# Patient Record
Sex: Male | Born: 1940
Health system: Southern US, Community
[De-identification: ages and names within clinical notes are randomized; demographics above are authoritative.]

## PROBLEM LIST (undated history)

## (undated) DIAGNOSIS — R011 Cardiac murmur, unspecified: Secondary | ICD-10-CM

## (undated) DIAGNOSIS — I10 Essential (primary) hypertension: Secondary | ICD-10-CM

## (undated) DIAGNOSIS — R630 Anorexia: Secondary | ICD-10-CM

## (undated) DIAGNOSIS — M1990 Unspecified osteoarthritis, unspecified site: Secondary | ICD-10-CM

## (undated) DIAGNOSIS — I4892 Unspecified atrial flutter: Secondary | ICD-10-CM

## (undated) DIAGNOSIS — E7800 Pure hypercholesterolemia, unspecified: Secondary | ICD-10-CM

## (undated) DIAGNOSIS — M549 Dorsalgia, unspecified: Secondary | ICD-10-CM

## (undated) DIAGNOSIS — I350 Nonrheumatic aortic (valve) stenosis: Secondary | ICD-10-CM

## (undated) DIAGNOSIS — R29898 Other symptoms and signs involving the musculoskeletal system: Secondary | ICD-10-CM

## (undated) DIAGNOSIS — M109 Gout, unspecified: Secondary | ICD-10-CM

## (undated) DIAGNOSIS — C61 Malignant neoplasm of prostate: Secondary | ICD-10-CM

## (undated) DIAGNOSIS — Z87442 Personal history of urinary calculi: Secondary | ICD-10-CM

## (undated) DIAGNOSIS — E119 Type 2 diabetes mellitus without complications: Secondary | ICD-10-CM

## (undated) DIAGNOSIS — K219 Gastro-esophageal reflux disease without esophagitis: Secondary | ICD-10-CM

## (undated) HISTORY — PX: CATARACT EXTRACTION W/ INTRAOCULAR LENS IMPLANT: SHX1309

## (undated) HISTORY — PX: EYE SURGERY: SHX253

## (undated) HISTORY — DX: Essential (primary) hypertension: I10

## (undated) HISTORY — DX: Malignant neoplasm of prostate: C61

## (undated) HISTORY — DX: Unspecified osteoarthritis, unspecified site: M19.90

## (undated) HISTORY — DX: Unspecified atrial flutter: I48.92

## (undated) HISTORY — PX: COLON RESECTION: SHX5231

## (undated) HISTORY — PX: HERNIA REPAIR: SHX51

## (undated) HISTORY — DX: Nonrheumatic aortic (valve) stenosis: I35.0

---

## 1992-08-20 HISTORY — PX: PROSTATECTOMY: SHX69

## 2002-01-21 ENCOUNTER — Ambulatory Visit (HOSPITAL_COMMUNITY): Admission: RE | Admit: 2002-01-21 | Discharge: 2002-01-21 | Payer: Self-pay | Admitting: Gastroenterology

## 2002-01-21 ENCOUNTER — Encounter (INDEPENDENT_AMBULATORY_CARE_PROVIDER_SITE_OTHER): Payer: Self-pay | Admitting: Specialist

## 2007-11-24 ENCOUNTER — Encounter (INDEPENDENT_AMBULATORY_CARE_PROVIDER_SITE_OTHER): Payer: Self-pay | Admitting: General Surgery

## 2007-11-24 ENCOUNTER — Inpatient Hospital Stay (HOSPITAL_COMMUNITY): Admission: RE | Admit: 2007-11-24 | Discharge: 2007-11-30 | Payer: Self-pay | Admitting: General Surgery

## 2008-10-06 ENCOUNTER — Encounter: Admission: RE | Admit: 2008-10-06 | Discharge: 2008-11-11 | Payer: Self-pay | Admitting: Family Medicine

## 2010-06-05 ENCOUNTER — Encounter: Admission: RE | Admit: 2010-06-05 | Discharge: 2010-06-05 | Payer: Self-pay | Admitting: Family Medicine

## 2011-01-02 NOTE — Op Note (Signed)
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   POSTOP DIAGNOSIS:  Highly dysplastic tubulovillous adenoma of the mid  transverse colon.   PROCEDURE:  Partial colectomy (transverse colectomy) with mobilization  of splenic flexure.   SURGEON:  Adolph Pollack, M.D.   ASSISTANT:  Cyndia Bent, MD   ANESTHESIA:  General.   INDICATIONS:  This 70 year old male has a history of colonic polyps and  was having a follow-up colonoscopy when a 15 mm polyp was found in the  descending colon, biopsied with the above pathology results and a high  suspicion for potential malignancy.  He now presents for elective  partial colectomy.   TECHNIQUE:  He was seen in the holding area and brought to the operating  room, placed supine on the operating table and general anesthetic was  administered.  A Foley catheter was placed in the bladder.  Hair of the  abdominal wall was clipped and the area sterilely prepped and draped.  He had a previous epigastric incision and a lower midline incision.  I  began inferior to the xiphoid process and made a midline incision  through the previous epigastric scar and below it.  I divided the skin,  subcutaneous tissue and fascia and I also cut some sutures from his  previous primary hernia repair.  Entering the peritoneal cavity there  were omental adhesions to the abdominal wall which were able to be taken  down using electrocautery.  A Bookwalter retractor was used for  exposure.   Following this I began by exploring the abdomen.  The liver was smooth  without lesions.  I  started at the distal transverse colon and I began  mobilizing the colon, specifically mobilizing the splenic flexure by  dividing its attachments using electrocautery and using careful blunt  dissection.  I could not find an obvious tattoo mark in the distal  transverse colon nor in the proximal descending colon.  Subsequently I  mobilized the descending colon all the way down to mobilizing the  sigmoid colon and again did not find a tattoo mark.  I then began  dissecting the omentum off the transverse colon and in the mid  transverse colon I then found the tattoo mark.  I dissected the omentum  all the way to the hepatic flexure and mobilized the hepatic flexure  proximally.  The splenic flexure had been completely mobilized.  I was  able to medialized the left colon into the midline area.   I picked an area of resection in the transverse colon.  I then divided  the mesentery in a wedge-shaped fashion between the proximal and distal  areas of transection.  I then placed the  portions of the colon side-to-  side.  An enterotomy was made in the proximal and distal portion of the  colon chosen for resection.  I then performed a side-to-side stapled  anastomosis between the distal ascending colon and the proximal  descending colon.  Using the TA-90 stapler I then sealed the common  defect by stapling it and then sent this specimen for pathology which  included a tattoo mark marking the proximal end with a suture.   Upon inspection of the anastomosis it was patent, it was viable and it  was under no tension.  A crotch stitch of 3-0 silk was used at the  distal staple line.   At this point, gloves were changed.  The abdominal cavity was then  irrigated with warm saline solution.  Some bleeding points of the  mesentery on the left side were controlled with LigaSure and  electrocautery.  The right side was hemostatic and the staple line was  hemostatic.  No intestinal injury or solid organ  injury was noted and  there was no evidence of active bleeding.   Following this I requested a sponge count which was reported be correct.  The midline fascia was then closed with a running #1 Novofil suture.  At  this point, needle, instrument, and sponge counts were reported to be  correct with this being the second sponge count.  The subcutaneous  tissue was irrigated.  The skin was closed with staples and a sterile  dressing was applied.   He tolerated the procedure without apparent complications and was taken  to recovery in satisfactory condition.      Adolph Pollack, M.D.  Electronically Signed     TJR/MEDQ  D:  11/24/2007  T:  11/24/2007  Job:  [id number]   cc:   Danise Edge, M.D.  Fax: [phone]   Chales Salmon. Abigail Miyamoto, M.D.  Fax: [phone]

## 2011-01-02 NOTE — H&P (Signed)
[name], [name]                   ACCOUNT NO.:  [id number]   MEDICAL RECORD NO.:  [id number]          PATIENT TYPE:  INP   LOCATION:  0005                         FACILITY:  [facility]   PHYSICIAN:  Adolph Pollack, M.D.DATE OF BIRTH:  August 28, 1940   DATE OF ADMISSION:  11/24/2007  DATE OF DISCHARGE:                              HISTORY & PHYSICAL   REASON FOR ADMISSION:  Elective partial colectomy.   HISTORY OF PRESENT ILLNESS:  Mr. [name] is a 70 year old male who  underwent a colonoscopy by Dr. Danise Edge as he has had a personal  history of colonic polyps.  There is polyp found in the proximal  descending colon that was biopsied and demonstrated a tubulovillous  adenoma with high-grade glandular dysplasia and focal atypia suspicious  for invasion into the stromal of the polyp which was suspicious for an  actual malignancy.  The polyp was removed in segmental fashion by Dr.  Laural Benes and then a tattoo placed and now he presents for elective  partial colectomy.   PAST MEDICAL HISTORY:  1. Prostate cancer.  2. Gastroesophageal reflux.  3. Hyperlipidemia.  4. Type 2 diabetes.  5. Hypertension.  6. Gout.  7. Colonic polyps.   PREVIOUS OPERATIONS:  1. Umbilical/epigastric hernia.  2. Prostatectomy.   ALLERGIES:  NO KNOWN DRUG ALLERGIES.   CURRENT MEDICATIONS:  Ranitidine, Tricor, Lipitor,  lisinopril/hydrochlorothiazide, verapamil ER, Zetia, Actos, allopurinol,  baby aspirin.   SOCIAL HISTORY:  He is married.  He works for [employer].  No tobacco or  alcohol use.   FAMILY HISTORY:  Family history is notable for breast cancer but no  colon cancer.   PHYSICAL EXAM:  GENERAL:  A moderately overweight male in no acute  distress, pleasant and cooperative.  VITAL SIGNS:  Temperature 97.6, pulse 92, blood pressure is 148/85.  EYES:  Extraocular motions intact.  No icterus.  NECK:  Neck is supple without masses.  RESPIRATORY:  Breath sounds equal and clear.  Respirations  unlabored.  CARDIOVASCULAR:  Demonstrates a regular rate, irregular rhythm.  ABDOMEN:  His abdomen is soft with epigastric midline scar and a lower  midline scar.  No palpable masses.  No tenderness.  No organomegaly.  MUSCULOSKELETAL:  He has SCDs on his lower extremities.  SKIN:  No jaundice.   IMPRESSION:  Highly dysplastic tubulovillous adenoma of proximal  descending colon, suspicious for actual carcinoma but not definitive by  biopsy.   PLAN:  Partial colectomy.  He and I have discussed the procedure and  risks preoperatively.      Adolph Pollack, M.D.  Electronically Signed     TJR/MEDQ  D:  11/24/2007  T:  11/24/2007  Job:  [id number]

## 2011-01-05 NOTE — Procedures (Signed)
[facility]  Patient:    [name], [name] Visit Number: [id number] MRN: [id number]          Service Type: END Location: ENDO Attending Physician:  Dennison Bulla Ii Dictated by:   Verlin Grills, M.D. Proc. Date: 01/21/02 Admit Date:  01/21/2002 Discharge Date: 01/21/2002   CC:         Molly Maduro K. Abigail Miyamoto, M.D.   Procedure Report  PROCEDURE:  Colonoscopy with polypectomy.  REFERRING PHYSICIAN:  Chales Salmon. Abigail Miyamoto, M.D.  PROCEDURE INDICATION:  [name] is a 70 year old male, born 1941-04-09.  [name] underwent his health maintenance flexible proctosigmoidoscopy, performed by Dr. Henrine Screws, and a small distal rectal polyp was discovered.  ENDOSCOPIST:  Verlin Grills, M.D.  PREMEDICATION:  Versed 5 mg, Demerol 50 mg.  ENDOSCOPE:  Olympus pediatric colonoscope.  DESCRIPTION OF PROCEDURE:  After obtaining informed consent, [name] was placed in the left lateral decubitus position.  I administered intravenous Demerol and intravenous Versed to achieve conscious sedation for the procedure.  The patients blood pressure, oxygen saturation, and cardiac rhythm were monitored throughout the procedure and documented in the medical record.  Anal inspection was normal.  Digital rectal exam was normal.  The patient underwent prostate cancer surgery in 1994.  The Olympus pediatric video colonoscope was introduced into the rectum and easily advanced to the cecum. Colonic preparation for the exam today was excellent.  RECTUM:  From the distal rectum, a 0.5 mm sessile polyp was removed with the cold biopsy forceps.  SIGMOID COLON AND DESCENDING COLON:  At 40 cm from the anal verge, a 1 mm sessile polyp was removed with the hot biopsy forceps.  SPLENIC FLEXURE:  Normal.  TRANSVERSE COLON:  Normal.  HEPATIC FLEXURE:  Normal.  ASCENDING COLON:  Normal.  CECUM AND ILEOCECAL VALVE:  Normal.  ASSESSMENT:  From the distal  rectum, a 0.5 mm sessile polyp was removed and from the sigmoid colon at 40 cm from the anal verge, a 1 mm sessile polyp was removed.  RECOMMENDATIONS:  If polyps return neoplastic pathologically, [name] should undergo a repeat colonoscopy in five years. Dictated by:   Verlin Grills, M.D. Attending Physician:  Dennison Bulla Ii DD:  01/21/02 TD:  01/22/02 Job: [id number]

## 2011-01-05 NOTE — Discharge Summary (Signed)
[name], [name]                   ACCOUNT NO.:  [id number]   MEDICAL RECORD NO.:  [id number]          PATIENT TYPE:  INP   LOCATION:  1520                         FACILITY:  [facility]   PHYSICIAN:  Adolph Pollack, M.D.DATE OF BIRTH:  Nov 13, 1940   DATE OF ADMISSION:  11/24/2007  DATE OF DISCHARGE:  11/30/2007                               DISCHARGE SUMMARY   PRINCIPAL DISCHARGE DIAGNOSIS:  Tubulovillous adenoma with high-grade  glandular dysplasia,  transverse colon.   SECONDARY DIAGNOSES:  1. Hypertension.  2. Type 2 diabetes.  3. Gastroesophageal reflux.  4. Hyperlipidemia.  5. Prostate cancer.  6. Mild postoperative ileus.   PROCEDURE:  Transverse colectomy.   REASON FOR ADMISSION:  [name] is a 70 year old male who underwent a  colonoscopy by Dr. Laural Benes.  He had a personal history of colon polyps.  There was a polyp in the proximal descending colon, transverse colon  area that was biopsied and demonstrated a tubulovillous adenoma with  high-grade glandular dysplasia and focal atypia suspicious for possible  invasive malignancy but not definite and so no definite diagnosis of  colon cancer was given.  The area was marked and he was referred for  segmental colectomy.  He was admitted for that.   HOSPITAL COURSE:  He underwent a transverse colectomy November 24, 2007,  which he tolerated well.  Postoperatively he was placed on sliding scale  insulin for blood sugar control.  He had a mild postop ileus and started  passing gas by his fourth postoperative day, moving bowels, and he  was  able to have a diet advanced and started on oral analgesic.  He was  restarted on his oral diabetes medications on postop day #5.  By postop  day #6, he was tolerating his diet and moving his bowels.  He had  staples in with just a mild amount of redness but no obvious infection.  He was discharged.   DISPOSITION:  Discharged to home with his staples in November 30, 2007.  He  is going to come  back and see me in a few days to have the staples  removed.  He was given specific discharge instructions and pain  medication.      Adolph Pollack, M.D.  Electronically Signed     TJR/MEDQ  D:  12/12/2007  T:  12/12/2007  Job:  [id number]   cc:   Danise Edge, M.D.  Fax: [phone]   Chales Salmon. Abigail Miyamoto, M.D.  Fax: [phone]

## 2011-04-10 ENCOUNTER — Ambulatory Visit: Payer: Self-pay | Admitting: Ophthalmology

## 2011-04-24 ENCOUNTER — Ambulatory Visit: Payer: Self-pay | Admitting: Ophthalmology

## 2011-05-15 LAB — CBC
Hemoglobin: 14.6
MCHC: 34
MCHC: 34.9
Platelets: 220
RBC: 4.93
RDW: 15.3
RDW: 15.4

## 2011-05-15 LAB — DIFFERENTIAL
Eosinophils Relative: 4
Lymphocytes Relative: 24
Lymphs Abs: 1.9
Monocytes Absolute: 0.6
Neutro Abs: 5.2

## 2011-05-15 LAB — ABO/RH: ABO/RH(D): A POS

## 2011-05-15 LAB — COMPREHENSIVE METABOLIC PANEL
AST: 29
Albumin: 3.8
BUN: 22
Calcium: 10.5
Creatinine, Ser: 1.49
GFR calc Af Amer: 57 — ABNORMAL LOW
Total Protein: 7

## 2011-05-15 LAB — BASIC METABOLIC PANEL
CO2: 27
Calcium: 9.1
Creatinine, Ser: 1.29
GFR calc Af Amer: >60
GFR calc non Af Amer: 56 — ABNORMAL LOW
Glucose, Bld: 166 — ABNORMAL HIGH
Sodium: 135

## 2011-05-15 LAB — TYPE AND SCREEN
ABO/RH(D): A POS
Antibody Screen: NEGATIVE

## 2011-05-15 LAB — PROTIME-INR: Prothrombin Time: 12.6

## 2011-05-15 LAB — CEA: CEA: 1.2

## 2011-06-06 ENCOUNTER — Ambulatory Visit: Payer: Self-pay | Admitting: Ophthalmology

## 2011-06-19 ENCOUNTER — Ambulatory Visit: Payer: Self-pay | Admitting: Ophthalmology

## 2014-01-13 ENCOUNTER — Emergency Department (HOSPITAL_COMMUNITY): Payer: Medicare Other

## 2014-01-13 ENCOUNTER — Encounter (HOSPITAL_COMMUNITY): Payer: Self-pay | Admitting: Emergency Medicine

## 2014-01-13 ENCOUNTER — Inpatient Hospital Stay (HOSPITAL_COMMUNITY)
Admission: EM | Admit: 2014-01-13 | Discharge: 2014-02-11 | DRG: 870 | Disposition: A | Discharge status: Skilled Nursing Facility | Payer: Medicare Other | Attending: Internal Medicine | Admitting: Internal Medicine

## 2014-01-13 DIAGNOSIS — R6521 Severe sepsis with septic shock: Secondary | ICD-10-CM

## 2014-01-13 DIAGNOSIS — N183 Chronic kidney disease, stage 3 unspecified: Secondary | ICD-10-CM | POA: Diagnosis present

## 2014-01-13 DIAGNOSIS — I484 Atypical atrial flutter: Secondary | ICD-10-CM

## 2014-01-13 DIAGNOSIS — M545 Low back pain, unspecified: Secondary | ICD-10-CM | POA: Diagnosis present

## 2014-01-13 DIAGNOSIS — R410 Disorientation, unspecified: Secondary | ICD-10-CM

## 2014-01-13 DIAGNOSIS — K228 Other specified diseases of esophagus: Secondary | ICD-10-CM | POA: Diagnosis present

## 2014-01-13 DIAGNOSIS — E875 Hyperkalemia: Secondary | ICD-10-CM | POA: Diagnosis present

## 2014-01-13 DIAGNOSIS — E872 Acidosis, unspecified: Secondary | ICD-10-CM | POA: Diagnosis present

## 2014-01-13 DIAGNOSIS — N2581 Secondary hyperparathyroidism of renal origin: Secondary | ICD-10-CM | POA: Diagnosis present

## 2014-01-13 DIAGNOSIS — E663 Overweight: Secondary | ICD-10-CM | POA: Diagnosis present

## 2014-01-13 DIAGNOSIS — D631 Anemia in chronic kidney disease: Secondary | ICD-10-CM | POA: Diagnosis present

## 2014-01-13 DIAGNOSIS — T46905A Adverse effect of unspecified agents primarily affecting the cardiovascular system, initial encounter: Secondary | ICD-10-CM | POA: Diagnosis present

## 2014-01-13 DIAGNOSIS — N179 Acute kidney failure, unspecified: Secondary | ICD-10-CM | POA: Diagnosis present

## 2014-01-13 DIAGNOSIS — I350 Nonrheumatic aortic (valve) stenosis: Secondary | ICD-10-CM | POA: Diagnosis present

## 2014-01-13 DIAGNOSIS — D649 Anemia, unspecified: Secondary | ICD-10-CM | POA: Diagnosis present

## 2014-01-13 DIAGNOSIS — D696 Thrombocytopenia, unspecified: Secondary | ICD-10-CM | POA: Diagnosis present

## 2014-01-13 DIAGNOSIS — I359 Nonrheumatic aortic valve disorder, unspecified: Secondary | ICD-10-CM | POA: Diagnosis present

## 2014-01-13 DIAGNOSIS — R1310 Dysphagia, unspecified: Secondary | ICD-10-CM | POA: Diagnosis present

## 2014-01-13 DIAGNOSIS — J189 Pneumonia, unspecified organism: Secondary | ICD-10-CM

## 2014-01-13 DIAGNOSIS — J80 Acute respiratory distress syndrome: Secondary | ICD-10-CM

## 2014-01-13 DIAGNOSIS — D6959 Other secondary thrombocytopenia: Secondary | ICD-10-CM | POA: Diagnosis present

## 2014-01-13 DIAGNOSIS — J9601 Acute respiratory failure with hypoxia: Secondary | ICD-10-CM

## 2014-01-13 DIAGNOSIS — K2289 Other specified disease of esophagus: Secondary | ICD-10-CM | POA: Diagnosis present

## 2014-01-13 DIAGNOSIS — I214 Non-ST elevation (NSTEMI) myocardial infarction: Secondary | ICD-10-CM | POA: Diagnosis not present

## 2014-01-13 DIAGNOSIS — E861 Hypovolemia: Secondary | ICD-10-CM | POA: Diagnosis present

## 2014-01-13 DIAGNOSIS — E119 Type 2 diabetes mellitus without complications: Secondary | ICD-10-CM | POA: Diagnosis present

## 2014-01-13 DIAGNOSIS — J690 Pneumonitis due to inhalation of food and vomit: Secondary | ICD-10-CM | POA: Diagnosis present

## 2014-01-13 DIAGNOSIS — A419 Sepsis, unspecified organism: Principal | ICD-10-CM | POA: Diagnosis present

## 2014-01-13 DIAGNOSIS — I4892 Unspecified atrial flutter: Secondary | ICD-10-CM | POA: Diagnosis not present

## 2014-01-13 DIAGNOSIS — R6520 Severe sepsis without septic shock: Secondary | ICD-10-CM | POA: Diagnosis present

## 2014-01-13 DIAGNOSIS — L97509 Non-pressure chronic ulcer of other part of unspecified foot with unspecified severity: Secondary | ICD-10-CM | POA: Diagnosis present

## 2014-01-13 DIAGNOSIS — N039 Chronic nephritic syndrome with unspecified morphologic changes: Secondary | ICD-10-CM

## 2014-01-13 DIAGNOSIS — I5032 Chronic diastolic (congestive) heart failure: Secondary | ICD-10-CM | POA: Diagnosis present

## 2014-01-13 DIAGNOSIS — I509 Heart failure, unspecified: Secondary | ICD-10-CM | POA: Diagnosis present

## 2014-01-13 DIAGNOSIS — G8929 Other chronic pain: Secondary | ICD-10-CM | POA: Diagnosis present

## 2014-01-13 DIAGNOSIS — J9600 Acute respiratory failure, unspecified whether with hypoxia or hypercapnia: Secondary | ICD-10-CM | POA: Diagnosis present

## 2014-01-13 DIAGNOSIS — R1319 Other dysphagia: Secondary | ICD-10-CM | POA: Diagnosis present

## 2014-01-13 DIAGNOSIS — N170 Acute kidney failure with tubular necrosis: Secondary | ICD-10-CM | POA: Diagnosis present

## 2014-01-13 DIAGNOSIS — T3995XA Adverse effect of unspecified nonopioid analgesic, antipyretic and antirheumatic, initial encounter: Secondary | ICD-10-CM | POA: Diagnosis not present

## 2014-01-13 DIAGNOSIS — I129 Hypertensive chronic kidney disease with stage 1 through stage 4 chronic kidney disease, or unspecified chronic kidney disease: Secondary | ICD-10-CM | POA: Diagnosis present

## 2014-01-13 DIAGNOSIS — I10 Essential (primary) hypertension: Secondary | ICD-10-CM | POA: Diagnosis present

## 2014-01-13 DIAGNOSIS — Z79899 Other long term (current) drug therapy: Secondary | ICD-10-CM

## 2014-01-13 DIAGNOSIS — E785 Hyperlipidemia, unspecified: Secondary | ICD-10-CM | POA: Diagnosis present

## 2014-01-13 DIAGNOSIS — E876 Hypokalemia: Secondary | ICD-10-CM | POA: Diagnosis not present

## 2014-01-13 HISTORY — DX: Essential (primary) hypertension: I10

## 2014-01-13 HISTORY — DX: Pure hypercholesterolemia, unspecified: E78.00

## 2014-01-13 HISTORY — DX: Type 2 diabetes mellitus without complications: E11.9

## 2014-01-13 LAB — COMPREHENSIVE METABOLIC PANEL
ALBUMIN: 3.6 g/dL (ref 3.5–5.2)
ALBUMIN: 3.5 g/dL (ref 3.5–5.2)
ALK PHOS: 81 U/L (ref 39–117)
ALT: 21 U/L (ref 0–53)
ALT: 19 U/L (ref 0–53)
AST: 67 U/L — ABNORMAL HIGH (ref 0–37)
AST: 53 U/L — AB (ref 0–37)
Alkaline Phosphatase: 85 U/L (ref 39–117)
BILIRUBIN TOTAL: 1.1 mg/dL (ref 0.3–1.2)
BUN: 129 mg/dL — AB (ref 6–23)
BUN: 140 mg/dL — ABNORMAL HIGH (ref 6–23)
CO2: 8 mEq/L — CL (ref 19–32)
CO2: 8 mEq/L — CL (ref 19–32)
CREATININE: 9.18 mg/dL — AB (ref 0.50–1.35)
Calcium: 10.4 mg/dL (ref 8.4–10.5)
Calcium: 10.3 mg/dL (ref 8.4–10.5)
Chloride: 94 mEq/L — ABNORMAL LOW (ref 96–112)
Chloride: 93 mEq/L — ABNORMAL LOW (ref 96–112)
Creatinine, Ser: 9.36 mg/dL — ABNORMAL HIGH (ref 0.50–1.35)
GFR calc Af Amer: 6 mL/min — ABNORMAL LOW (ref >90)
GFR calc Af Amer: 6 mL/min — ABNORMAL LOW (ref >90)
GFR calc non Af Amer: 5 mL/min — ABNORMAL LOW (ref >90)
GFR calc non Af Amer: 5 mL/min — ABNORMAL LOW (ref >90)
GLUCOSE: 120 mg/dL — AB (ref 70–99)
Glucose, Bld: 110 mg/dL — ABNORMAL HIGH (ref 70–99)
Potassium: >7.7 mEq/L (ref 3.7–5.3)
SODIUM: 136 meq/L — AB (ref 137–147)
Sodium: 136 mEq/L — ABNORMAL LOW (ref 137–147)
TOTAL PROTEIN: 7.8 g/dL (ref 6.0–8.3)
TOTAL PROTEIN: 7.4 g/dL (ref 6.0–8.3)
Total Bilirubin: 0.9 mg/dL (ref 0.3–1.2)

## 2014-01-13 LAB — I-STAT TROPONIN, ED: Troponin i, poc: 1.65 ng/mL (ref 0.00–0.08)

## 2014-01-13 LAB — CBC WITH DIFFERENTIAL/PLATELET
BASOS PCT: 0 % (ref 0–1)
Basophils Absolute: 0 10*3/uL (ref 0.0–0.1)
Basophils Absolute: 0.2 10*3/uL — ABNORMAL HIGH (ref 0.0–0.1)
Basophils Relative: 1 % (ref 0–1)
EOS ABS: 0.3 10*3/uL (ref 0.0–0.7)
EOS PCT: 0 % (ref 0–5)
Eosinophils Absolute: 0 10*3/uL (ref 0.0–0.7)
Eosinophils Relative: 5 % (ref 0–5)
HEMATOCRIT: 34.8 % — AB (ref 39.0–52.0)
HEMATOCRIT: 42.1 % (ref 39.0–52.0)
HEMOGLOBIN: 13.8 g/dL (ref 13.0–17.0)
Hemoglobin: 11.7 g/dL — ABNORMAL LOW (ref 13.0–17.0)
LYMPHS ABS: 1.8 10*3/uL (ref 0.7–4.0)
LYMPHS PCT: 9 % — AB (ref 12–46)
Lymphocytes Relative: 23 % (ref 12–46)
Lymphs Abs: 1.4 10*3/uL (ref 0.7–4.0)
MCH: 33.3 pg (ref 26.0–34.0)
MCH: 28.8 pg (ref 26.0–34.0)
MCHC: 32.8 g/dL (ref 30.0–36.0)
MCHC: 33.6 g/dL (ref 30.0–36.0)
MCV: 101.4 fL — AB (ref 78.0–100.0)
MCV: 85.7 fL (ref 78.0–100.0)
MONO ABS: 0.7 10*3/uL (ref 0.1–1.0)
MONOS PCT: 11 % (ref 3–12)
MONOS PCT: 15 % — AB (ref 3–12)
Monocytes Absolute: 3 10*3/uL — ABNORMAL HIGH (ref 0.1–1.0)
Neutro Abs: 3.9 10*3/uL (ref 1.7–7.7)
Neutro Abs: 15.1 10*3/uL — ABNORMAL HIGH (ref 1.7–7.7)
Neutrophils Relative %: 61 % (ref 43–77)
Neutrophils Relative %: 75 % (ref 43–77)
PLATELETS: 32 10*3/uL — AB (ref 150–400)
Platelets: 131 10*3/uL — ABNORMAL LOW (ref 150–400)
RBC: 4.06 MIL/uL — AB (ref 4.22–5.81)
RBC: 4.15 MIL/uL — ABNORMAL LOW (ref 4.22–5.81)
RDW: 13.7 % (ref 11.5–15.5)
RDW: 17.1 % — ABNORMAL HIGH (ref 11.5–15.5)
WBC MORPHOLOGY: INCREASED
WBC: 20.1 10*3/uL — AB (ref 4.0–10.5)
WBC: 6.4 10*3/uL (ref 4.0–10.5)

## 2014-01-13 LAB — I-STAT ARTERIAL BLOOD GAS, ED
Acid-base deficit: 16 mmol/L — ABNORMAL HIGH (ref 0.0–2.0)
BICARBONATE: 9.5 meq/L — AB (ref 20.0–24.0)
O2 Saturation: 99 %
PCO2 ART: 21.9 mmHg — AB (ref 35.0–45.0)
PH ART: 7.246 — AB (ref 7.350–7.450)
Patient temperature: 98.7
TCO2: 10 mmol/L (ref 0–100)
pO2, Arterial: 149 mmHg — ABNORMAL HIGH (ref 80.0–100.0)

## 2014-01-13 LAB — I-STAT CHEM 8, ED
BUN: >140 mg/dL — ABNORMAL HIGH (ref 6–23)
CREATININE: 10.5 mg/dL — AB (ref 0.50–1.35)
Calcium, Ion: 1.28 mmol/L (ref 1.13–1.30)
Chloride: 105 mEq/L (ref 96–112)
Glucose, Bld: 156 mg/dL — ABNORMAL HIGH (ref 70–99)
HEMATOCRIT: 30 % — AB (ref 39.0–52.0)
HEMOGLOBIN: 10.2 g/dL — AB (ref 13.0–17.0)
Potassium: 6.6 mEq/L (ref 3.7–5.3)
SODIUM: 138 meq/L (ref 137–147)
TCO2: 13 mmol/L (ref 0–100)

## 2014-01-13 LAB — URINALYSIS, ROUTINE W REFLEX MICROSCOPIC
BILIRUBIN URINE: NEGATIVE
GLUCOSE, UA: NEGATIVE mg/dL
KETONES UR: NEGATIVE mg/dL
Leukocytes, UA: NEGATIVE
Nitrite: NEGATIVE
PH: 5 (ref 5.0–8.0)
Protein, ur: 100 mg/dL — AB
Specific Gravity, Urine: 1.017 (ref 1.005–1.030)
Urobilinogen, UA: 0.2 mg/dL (ref 0.0–1.0)

## 2014-01-13 LAB — URINE MICROSCOPIC-ADD ON

## 2014-01-13 LAB — I-STAT CG4 LACTIC ACID, ED: Lactic Acid, Venous: 8.59 mmol/L — ABNORMAL HIGH (ref 0.5–2.2)

## 2014-01-13 LAB — CBG MONITORING, ED
Glucose-Capillary: 121 mg/dL — ABNORMAL HIGH (ref 70–99)
Glucose-Capillary: 150 mg/dL — ABNORMAL HIGH (ref 70–99)

## 2014-01-13 LAB — PRO B NATRIURETIC PEPTIDE: PRO B NATRI PEPTIDE: 35530 pg/mL — AB (ref 0.0–100.0)

## 2014-01-13 LAB — AMMONIA: Ammonia: 30 umol/L (ref 11–60)

## 2014-01-13 MED ORDER — SODIUM CHLORIDE 0.9 % IV BOLUS (SEPSIS)
1000.0000 mL | Freq: Once | INTRAVENOUS | Status: AC
  Administered 2014-01-13: 1000 mL via INTRAVENOUS

## 2014-01-13 MED ORDER — SODIUM CHLORIDE 0.9 % IV BOLUS (SEPSIS): 1000.0000 mL | Freq: Once | INTRAVENOUS | Status: DC

## 2014-01-13 MED ORDER — INSULIN ASPART 100 UNIT/ML ~~LOC~~ SOLN
5.0000 [IU] | Freq: Once | SUBCUTANEOUS | Status: AC
  Administered 2014-01-13: 5 [IU] via INTRAVENOUS
  Filled 2014-01-13: qty 1

## 2014-01-13 MED ORDER — PIPERACILLIN-TAZOBACTAM IN DEX 2-0.25 GM/50ML IV SOLN
2.2500 g | Freq: Three times a day (TID) | INTRAVENOUS | Status: DC
  Filled 2014-01-13 (×2): qty 50

## 2014-01-13 MED ORDER — SODIUM CHLORIDE 0.9 % IV SOLN
1.0000 g | Freq: Once | INTRAVENOUS | Status: AC
  Administered 2014-01-13: 1 g via INTRAVENOUS
  Filled 2014-01-13: qty 10

## 2014-01-13 MED ORDER — SODIUM CHLORIDE 0.9 % IV SOLN
1000.0000 mL | INTRAVENOUS | Status: DC
  Administered 2014-01-13: 1000 mL via INTRAVENOUS

## 2014-01-13 MED ORDER — VANCOMYCIN HCL 500 MG IV SOLR
500.0000 mg | Freq: Once | INTRAVENOUS | Status: DC
  Filled 2014-01-13: qty 500

## 2014-01-13 MED ORDER — DEXTROSE 50 % IV SOLN
50.0000 mL | Freq: Once | INTRAVENOUS | Status: AC
  Administered 2014-01-13: 50 mL via INTRAVENOUS
  Filled 2014-01-13: qty 50

## 2014-01-13 MED ORDER — PIPERACILLIN-TAZOBACTAM 3.375 G IVPB 30 MIN
3.3750 g | Freq: Once | INTRAVENOUS | Status: AC
  Administered 2014-01-13: 3.375 g via INTRAVENOUS
  Filled 2014-01-13: qty 50

## 2014-01-13 MED ORDER — SODIUM BICARBONATE 8.4 % IV SOLN
50.0000 meq | Freq: Once | INTRAVENOUS | Status: AC
  Administered 2014-01-13: 50 meq via INTRAVENOUS
  Filled 2014-01-13: qty 50

## 2014-01-13 MED ORDER — VANCOMYCIN HCL IN DEXTROSE 1-5 GM/200ML-% IV SOLN
1000.0000 mg | Freq: Once | INTRAVENOUS | Status: AC
  Administered 2014-01-13: 1000 mg via INTRAVENOUS
  Filled 2014-01-13: qty 200

## 2014-01-13 NOTE — H&P (Signed)
PULMONARY / CRITICAL CARE MEDICINE   Name: [name] MRN: [id number] DOB: [birth year]    ADMISSION DATE:  01/13/2014 CONSULTATION DATE:  01/13/2014  REFERRING MD :  EDP PRIMARY SERVICE: PCCM  CHIEF COMPLAINT:  Back pain  BRIEF PATIENT DESCRIPTION: 73 year old male with HTN and DM presented 5/27 to ED c/o N/V since 5/25. In ED found to be in acute renal failure, hyperkalemic, hypotensive with SBP's in 70s. Started on BiPAP for hypoxia and increased WOB. PCCM asked to admit.   SIGNIFICANT EVENTS / STUDIES:  2/27 admitted  LINES / TUBES: Foley 5/27 >>> PIV  CULTURES: Blood 5/27 >>> Urine 5/27 >>> Sputum 5/27 >>>  ANTIBIOTICS: Zosyn 5/27 >>> Vancomycin 5/27 >>>  HISTORY OF PRESENT ILLNESS:  73 year old male with history of   PAST MEDICAL HISTORY :  Past Medical History  Diagnosis Date  . Diabetes mellitus without complication   . Hypertension   . Hypercholesteremia    Past Surgical History  Procedure Laterality Date  . Prostatectomy    . Colon resection     Prior to Admission medications   Medication Sig Start Date End Date Taking? Authorizing Provider  alendronate (FOSAMAX) 70 MG tablet Take 1 tablet by mouth once a week. Patient takes on Sunday 12/14/13  Yes Historical Provider, MD  allopurinol (ZYLOPRIM) 300 MG tablet Take 300 mg by mouth daily. 01/07/14  Yes Historical Provider, MD  lisinopril (PRINIVIL,ZESTRIL) 20 MG tablet Take 20 mg by mouth daily. 12/03/13  Yes Historical Provider, MD  lovastatin (MEVACOR) 20 MG tablet Take 20 mg by mouth daily. 12/07/13  Yes Historical Provider, MD  metFORMIN (GLUCOPHAGE) 1000 MG tablet Take 1,000 mg by mouth 2 (two) times daily. 01/07/14  Yes Historical Provider, MD  oxybutynin (DITROPAN) 5 MG tablet Take 5 mg by mouth 4 (four) times daily. 01/04/14  Yes Historical Provider, MD  ranitidine (ZANTAC) 150 MG tablet Take 150 mg by mouth 2 (two) times daily. 01/01/14  Yes Historical Provider, MD  verapamil (CALAN-SR) 240 MG CR tablet  Take 240 mg by mouth 2 (two) times daily. 10/19/13  Yes Historical Provider, MD   No Known Allergies  FAMILY HISTORY:  No family history on file. SOCIAL HISTORY:  does not have a smoking history on file. He has never used smokeless tobacco. He reports that he does not drink alcohol or use illicit drugs.  REVIEW OF SYSTEMS:   Review of Systems:   Bolds are positive  Constitutional: weight loss, gain, night sweats, Fevers, chills, fatigue .  HEENT: headaches, Sore throat, sneezing, nasal congestion, post nasal drip, Difficulty swallowing, Tooth/dental problems, visual complaints visual changes, ear ache CV:  chest pain, radiates: ,Orthopnea, PND, swelling in lower extremities, dizziness, palpitations, syncope.  GI  heartburn, indigestion, abdominal pain, nausea, vomiting, diarrhea, change in bowel habits, loss of appetite, bloody stools.  Resp: cough, productive: hemoptysis, dyspnea, chest pain, pleuritic.  Skin: rash or itching or icterus GU: dysuria, change in color of urine, urgency or frequency. flank pain, hematuria decreased urine output MS:  joint pain knees and back or swelling. decreased range of motion  Psych: change in mood or affect. depression or anxiety.  Neuro: difficulty with speech, weakness, numbness, ataxia    SUBJECTIVE:   VITAL SIGNS: Temp:  [97.7 F (36.5 C)-97.9 F (36.6 C)] 97.7 F (36.5 C) (05/27 2226) Pulse Rate:  [89-114] 105 (05/27 2300) Resp:  [20-30] 22 (05/27 2300) BP: (69-124)/(42-68) 113/56 mmHg (05/27 2300) SpO2:  [90 %-100 %] 99 % (  05/27 2300) FiO2 (%):  [75 %] 75 % (05/27 2239) Weight:  [87.091 kg (192 lb)] 87.091 kg (192 lb) (05/27 1939) HEMODYNAMICS:   VENTILATOR SETTINGS: Vent Mode:  [-]  FiO2 (%):  [75 %] 75 % INTAKE / OUTPUT: Intake/Output     05/27 0701 - 05/28 0700   I.V. (mL/kg) 1000 (11.5)   Total Intake(mL/kg) 1000 (11.5)   Net +1000         PHYSICAL EXAMINATION: General:  Elderly male on BiPAP in no acute distress. Neuro:   Alert, oriented x 3.  HEENT:  PERRL Cardiovascular: Intermittently tachy, regular Lungs:  Scattered rales Abdomen:  Soft, non-tender, non-distended Musculoskeletal:  No acute deformity Skin:  Intact  LABS:  CBC  Recent Labs Lab 01/13/14 1949 01/13/14 2125  WBC 6.4  20.1* 19.2*  HGB 13.8  11.7* 10.8*  HCT 42.1  34.8* 32.7*  PLT 131*  32* 26*   Coag's No results found for this basename: APTT, INR,  in the last 168 hours BMET  Recent Labs Lab 01/13/14 1949 01/13/14 2125  NA 136* 136*  K >7.7* >7.7*  CL 94* 93*  CO2 8* 8*  BUN 129* 140*  CREATININE 9.18* 9.36*  GLUCOSE 110* 120*   Electrolytes  Recent Labs Lab 01/13/14 1949 01/13/14 2125  CALCIUM 10.4 10.3   Sepsis Markers  Recent Labs Lab 01/13/14 2137  LATICACIDVEN 8.59*   ABG  Recent Labs Lab 01/13/14 2201  PHART 7.246*  PCO2ART 21.9*  PO2ART 149.0*   Liver Enzymes  Recent Labs Lab 01/13/14 1949 01/13/14 2125  AST 67* 53*  ALT 21 19  ALKPHOS 85 81  BILITOT 1.1 0.9  ALBUMIN 3.6 3.5   Cardiac Enzymes  Recent Labs Lab 01/13/14 2125  PROBNP 35530.0*   Glucose  Recent Labs Lab 01/13/14 2117  GLUCAP 121*    Imaging Dg Chest Port 1 View  01/13/2014   CLINICAL DATA:  73 year old male with back and knee pain. Shortness of Breath. Initial encounter.  EXAM: PORTABLE CHEST - 1 VIEW  COMPARISON:  11/21/2007.  FINDINGS: Portable AP supine view at 2211 hrs. New confluent bibasilar pulmonary opacity. No pneumothorax. Pulmonary vascularity within normal limits. Stable cardiac size and mediastinal contours. Visualized tracheal air column is within normal limits.  IMPRESSION: New confluent bibasilar pulmonary opacity, nonspecific but consider bilateral pneumonia or aspiration.   Electronically Signed   By: Lars Pinks M.D.   On: 01/13/2014 23:02    CXR:   ASSESSMENT / PLAN:  PULMONARY A: Acute Hypoxemic Respiratory Failure  CAP vs Aspiration PNA  P:   Continuous BiPAP Recheck  ABG CXR in AM   CARDIOVASCULAR A:  Hypotension secondary to severe sepsis (responded well to 2L NS) Troponin elevation - ?ECG changes, no comparison study H/o HTN  P:  Trend lactic acid 2D echo Trend troponin  RENAL A:   Acute renal failure - concern for NSAID induced nephropathy, also takes ACE-i Hyperkalemia > received insulin, calcium in ED High AG metabolic acidosis - on metformin at home Fluid volume overload  P:   CRRT per nephrology KVO IVF Monitor BMP  GASTROINTESTINAL A:   Nausea/Vomiting - currently no complaints  P:   NPO Monitor  HEMATOLOGIC A:   Anemia, appears acute Leukocytosis Thrombocytopenia likely in setting of chronic illness VTE prophylaxis  P:  Follow CBC SCD's  INFECTIOUS A:   Severe Sepsis possible etiology CAP vs Aspiration PNA.   P:   Abx and cultures as above Trend PCT Trend Fever  curve and WBC  ENDOCRINE A:   DM without hyperglycemia - takes metformin at home.    P:   CBG monitoring and SSI  NEUROLOGIC A:  Chronic low back pain    P:   Hold NSAIDs  [name], ACNP [facility] Pager [phone] or [phone]  Reviewed above, examined pt, and agree with above.  73 yo male presents with several days of nausea, vomiting.  Found to have hypotension, acute on ?chronic renal failure, metabolic acidosis, hyperkalemia, thrombocytopenia.  Reports frequent NSAID use, and is on ACE inhibitor and metformin.  Has infiltrate on CXR suggestive of aspiration pneumonitis.  He is to be admitted to ICU.  Renal consulted >> will start CRRT.  Continue Abx for aspiration pneumonia.  Updated family at bedside.  Discussed plan with Dr. Augustin Coupe with renal service.  CC time 50 minutes.  [name], MD [facility] Pulmonary/Critical Care 01/14/2014, 1:06 AM Pager:  [phone] After 3pm call: [phone]

## 2014-01-13 NOTE — ED Notes (Signed)
MD at bedside. 

## 2014-01-13 NOTE — ED Notes (Signed)
Pt. reports low back pain / left knee pain  onset Saturday denies injury , no dysuria or hematuria .

## 2014-01-13 NOTE — Consult Note (Signed)
Reason for Consult: Acute Renal Failure Referring Physician:  Dr. Halford Chessman  Chief Complaint: Weakness  Assessment/Plan: 1.  Acute Kidney Injury most likely from the persistent hypotension + infectious etiology leading to ATN. Part of the differential is also vasculitis which can also present with infiltrates on CXR, fatigue and renal failure with a RPGN like picture. I don't know what his baseline renal function is as his last creatinine was in 09/2023 (4.2-7.0) and certainly possible that he has progressed with the chronic NSAID use (per pt and spouse it has been daily usage). - Agree with initial fluid resuscitation with isotonic fluids --> he has responded somewhat to fluids but I am very hesitant to challenge with conventional dialysis. - Normally would continue aggressive fluid resuscitation but he has evidence of volume overload and is on BiPAP with increased work of breathing. - Will initiate CRRT to help manage the hyperkalemia, AKI, acidosis and hypervolemia. - Hold antihypertensives especially the ACE-I at this time. - Will check a urinalysis for proteinuria and microscopy for an activity index. - will also check an ANCA, ASO, C3, C4, ANA. - will check a renal ultrasound mainly for renal size (chronicity) and to rule out an obstructive etiology.  2.    Hyperkalemia secondary to the renal failure.  3.    Pneumonia? Infiltrate but patient denies fever, chills, rigors or productive cough. Possible with the uremia he may have aspirated but  would also check the urine to rule out urosepsis. 4.    DM 5.    Hypertension but currently hypotensive.   HPI: [name] is an 73 y.o. male with a history of a prostatectomy but denies any obstructive like symptoms or dysuria or hematuria. He is presenting with fatigue, anorexia, nausea, vomiting but denies any fever, rigors or chills. He has a nonproductive cough and an infiltrate on CXR in the ER. He uses NSAID's on a daily basis for lower back pain. He  denies any family history of CKD or anyone being on dialysis. He personally has never been on dialysis or seen a nephrologist. His baseline creatinine had been in the 1.3-1.5 range back in 11/2007 and we do not have any more labs until he presented this evening with a creatinine of 9.3. He denies any syncopal episodes, chest pain, but had to sleep in the sofa this week supposedly from the lower back pain. He denies any joint pain, rashes, photophobia or headaches. His systolic was in the 62'B when he first presented to the ER but after a few liters of NS his sytolic is in the low 762'G.   Trend in Creatinine: Creatinine, Ser  Date/Time Value Ref Range Status  01/13/2014  9:25 PM 9.36* 0.50 - 1.35 mg/dL Final  01/13/2014  7:49 PM 9.18* 0.50 - 1.35 mg/dL Final  11/25/2007  4:35 AM 1.29   Final  11/21/2007 11:15 AM 1.49   Final   Sodium  Date/Time Value Ref Range Status  01/13/2014  9:25 PM 136* 137 - 147 mEq/L Final  01/13/2014  7:49 PM 136* 137 - 147 mEq/L Final  11/25/2007  4:35 AM 135   Final  11/21/2007 11:15 AM 141   Final   PMH:   Past Medical History  Diagnosis Date  . Diabetes mellitus without complication   . Hypertension   . Hypercholesteremia     PSH:   Past Surgical History  Procedure Laterality Date  . Prostatectomy    . Colon resection      Allergies: No  Known Allergies  Medications:   Prior to Admission medications   Medication Sig Start Date End Date Taking? Authorizing Provider  alendronate (FOSAMAX) 70 MG tablet Take 1 tablet by mouth once a week. Patient takes on Sunday 12/14/13  Yes Historical Provider, MD  allopurinol (ZYLOPRIM) 300 MG tablet Take 300 mg by mouth daily. 01/07/14  Yes Historical Provider, MD  lisinopril (PRINIVIL,ZESTRIL) 20 MG tablet Take 20 mg by mouth daily. 12/03/13  Yes Historical Provider, MD  lovastatin (MEVACOR) 20 MG tablet Take 20 mg by mouth daily. 12/07/13  Yes Historical Provider, MD  metFORMIN (GLUCOPHAGE) 1000 MG tablet Take 1,000 mg by mouth  2 (two) times daily. 01/07/14  Yes Historical Provider, MD  oxybutynin (DITROPAN) 5 MG tablet Take 5 mg by mouth 4 (four) times daily. 01/04/14  Yes Historical Provider, MD  ranitidine (ZANTAC) 150 MG tablet Take 150 mg by mouth 2 (two) times daily. 01/01/14  Yes Historical Provider, MD  verapamil (CALAN-SR) 240 MG CR tablet Take 240 mg by mouth 2 (two) times daily. 10/19/13  Yes Historical Provider, MD    Discontinued Meds:   Medications Discontinued During This Encounter  Medication Reason  . sodium chloride 0.9 % bolus 1,000 mL     Social History:  does not have a smoking history on file. He has never used smokeless tobacco. He reports that he does not drink alcohol or use illicit drugs.  Family History:  No family history on file.  Pertinent items are noted in HPI.  Blood pressure 113/56, pulse 105, temperature 97.7 F (36.5 C), temperature source Rectal, resp. rate 22, height 5\' 11"  (1.803 m), weight 87.091 kg (192 lb), SpO2 99.00%. General appearance: alert, cooperative, appears stated age and cachectic Head: Normocephalic, without obvious abnormality, atraumatic Eyes: conjunctivae/corneas clear. PERRL, EOM's intact. Fundi benign. Nose: Nares normal. Septum midline. Mucosa normal. No drainage or sinus tenderness. Neck: no adenopathy, no carotid bruit, no JVD, supple, symmetrical, trachea midline and thyroid not enlarged, symmetric, no tenderness/mass/nodules Back: negative, symmetric, no curvature. ROM normal. No CVA tenderness. Resp: rales bilaterally Chest wall: no tenderness Cardio: regular rate and rhythm, S1, S2 normal, no murmur, click, rub or gallop GI: soft, non-tender; bowel sounds normal; no masses,  no organomegaly Extremities: trace edema Pulses: 2+ and symmetric Skin: Skin color, texture, turgor normal. No rashes or lesions Neurologic: Grossly normal  Labs: Basic Metabolic Panel:  Recent Labs Lab 01/13/14 1949 01/13/14 2125  NA 136* 136*  K >7.7* >7.7*  CL  94* 93*  CO2 8* 8*  GLUCOSE 110* 120*  BUN 129* 140*  CREATININE 9.18* 9.36*  ALBUMIN 3.6 3.5  CALCIUM 10.4 10.3   Liver Function Tests:  Recent Labs Lab 01/13/14 1949 01/13/14 2125  AST 67* 53*  ALT 21 19  ALKPHOS 85 81  BILITOT 1.1 0.9  PROT 7.8 7.4  ALBUMIN 3.6 3.5   No results found for this basename: LIPASE, AMYLASE,  in the last 168 hours  Recent Labs Lab 01/13/14 2201  AMMONIA 30   CBC:  Recent Labs Lab 01/13/14 1949 01/13/14 2125  WBC 6.4  20.1* 19.2*  NEUTROABS 3.9  15.1* 14.4*  HGB 13.8  11.7* 10.8*  HCT 42.1  34.8* 32.7*  MCV 101.4*  85.7 85.4  PLT 131*  32* 26*   PT/INR: @LABRCNTIP (inr:5) Cardiac Enzymes: )No results found for this basename: CKTOTAL, CKMB, CKMBINDEX, TROPONINI,  in the last 168 hours CBG:  Recent Labs Lab 01/13/14 2117 01/13/14 2340  GLUCAP 121* 150*  Iron Studies: No results found for this basename: IRON, TIBC, TRANSFERRIN, FERRITIN,  in the last 168 hours  Xrays/Other Studies: Dg Chest Port 1 View  01/13/2014   CLINICAL DATA:  73 year old male with back and knee pain. Shortness of Breath. Initial encounter.  EXAM: PORTABLE CHEST - 1 VIEW  COMPARISON:  11/21/2007.  FINDINGS: Portable AP supine view at 2211 hrs. New confluent bibasilar pulmonary opacity. No pneumothorax. Pulmonary vascularity within normal limits. Stable cardiac size and mediastinal contours. Visualized tracheal air column is within normal limits.  IMPRESSION: New confluent bibasilar pulmonary opacity, nonspecific but consider bilateral pneumonia or aspiration.   Electronically Signed   By: Lars Pinks M.D.   On: 01/13/2014 23:02       Dwana Melena, MD 01/13/2014, 11:42 PM

## 2014-01-13 NOTE — Progress Notes (Signed)
ANTIBIOTIC CONSULT NOTE - INITIAL  Pharmacy Consult for Vancomycin + Zosyn Indication: rule out sepsis  No Known Allergies  Patient Measurements: Height: 5\' 11"  (180.3 cm) Weight: 192 lb (87.091 kg) IBW/kg (Calculated) : 75.3  Vital Signs: Temp: 97.9 F (36.6 C) (05/27 1939) Temp src: Oral (05/27 1939) BP: 91/52 mmHg (05/27 2210) Pulse Rate: 95 (05/27 2210) Intake/Output from previous day:   Intake/Output from this shift:    Labs:  Recent Labs  01/13/14 1949 01/13/14 2125  WBC 6.4  20.1* 19.2*  HGB 13.8  11.7* 10.8*  PLT 131*  32* PENDING  CREATININE 9.18* 9.36*   Estimated Creatinine Clearance: 7.6 ml/min (by C-G formula based on Cr of 9.36). No results found for this basename: VANCOTROUGH, VANCOPEAK, VANCORANDOM, GENTTROUGH, GENTPEAK, GENTRANDOM, TOBRATROUGH, TOBRAPEAK, TOBRARND, AMIKACINPEAK, AMIKACINTROU, AMIKACIN,  in the last 72 hours   Microbiology: No results found for this or any previous visit (from the past 720 hour(s)).  Medical History: Past Medical History  Diagnosis Date  . Diabetes mellitus without complication   . Hypertension   . Hypercholesteremia     Assessment: 4 YOM to start Vancomycin + Zosyn for r/o sepsis. The patient has already received Vancomycin 1g and Zosyn 3.375g x 1 dose in the [facility] around 2220.   The patient is noted to be in acute renal failure, SCr 9.36 (baseline~1), will give additional Vancomycin to complete load and then will monitor renal function, possible plans for urgent HD (d/t electrolyte abnormalities) and check random levels to determine when the next dose should be due.   Goal of Therapy:  Vancomycin trough level 15-20 mcg/ml  Plan:  1. Vancomycin 500 mg x 1 dose now (for a total LD of 1500 mg) 2. No additional Vanc for now -- will monitor renal fxn and any plans for emergent HD (d/t electrolyte abnormalities) 3. Zosyn 2.25g IV every 8 hours 4. Will continue to follow renal function, culture results, LOT,  and antibiotic de-escalation plans   Alycia Rossetti, PharmD, BCPS Clinical Pharmacist Pager: [phone] 01/13/2014 10:35 PM

## 2014-01-13 NOTE — ED Provider Notes (Signed)
CSN: [id number]     Arrival date & time 01/13/14  1931 History   First MD Initiated Contact with Patient 01/13/14 2104     Chief Complaint  Patient presents with  . Back Pain  . Knee Pain     (Consider location/radiation/quality/duration/timing/severity/associated sxs/prior Treatment) Patient is a 73 y.o. male presenting with vomiting. The history is provided by the spouse and the patient.  Emesis Severity:  Severe Duration:  7 days Timing:  Constant Number of daily episodes:  4 Quality:  Stomach contents Progression:  Partially resolved Chronicity:  New Recent urination:  Decreased (Last urinated yesterday) Relieved by:  Nothing Worsened by:  Nothing tried Ineffective treatments:  None tried Associated symptoms: abdominal pain, cough (Intermittent) and diarrhea (Persistent over the last week)   Risk factors: prior abdominal surgery     Past Medical History  Diagnosis Date  . Diabetes mellitus without complication   . Hypertension   . Hypercholesteremia    Past Surgical History  Procedure Laterality Date  . Prostatectomy    . Colon resection     No family history on file. History  Substance Use Topics  . Smoking status: Not on file  . Smokeless tobacco: Never Used  . Alcohol Use: No    Review of Systems  Respiratory: Positive for cough and shortness of breath (Started while in the waiting area).   Gastrointestinal: Positive for vomiting, abdominal pain and diarrhea (Persistent over the last week).  All other systems reviewed and are negative.     Allergies  Review of patient's allergies indicates no known allergies.  Home Medications   Prior to Admission medications   Medication Sig Start Date End Date Taking? Authorizing Provider  alendronate (FOSAMAX) 70 MG tablet Take 1 tablet by mouth once a week. Patient takes on Sunday 12/14/13  Yes Historical Provider, MD  allopurinol (ZYLOPRIM) 300 MG tablet Take 300 mg by mouth daily. 01/07/14  Yes Historical  Provider, MD  lisinopril (PRINIVIL,ZESTRIL) 20 MG tablet Take 20 mg by mouth daily. 12/03/13  Yes Historical Provider, MD  lovastatin (MEVACOR) 20 MG tablet Take 20 mg by mouth daily. 12/07/13  Yes Historical Provider, MD  metFORMIN (GLUCOPHAGE) 1000 MG tablet Take 1,000 mg by mouth 2 (two) times daily. 01/07/14  Yes Historical Provider, MD  oxybutynin (DITROPAN) 5 MG tablet Take 5 mg by mouth 4 (four) times daily. 01/04/14  Yes Historical Provider, MD  ranitidine (ZANTAC) 150 MG tablet Take 150 mg by mouth 2 (two) times daily. 01/01/14  Yes Historical Provider, MD  verapamil (CALAN-SR) 240 MG CR tablet Take 240 mg by mouth 2 (two) times daily. 10/19/13  Yes Historical Provider, MD   BP 91/52  Pulse 95  Temp(Src) 97.7 F (36.5 C) (Rectal)  Resp 20  Ht 5\' 11"  (1.803 m)  Wt 192 lb (87.091 kg)  BMI 26.79 kg/m2  SpO2 100% Physical Exam  Constitutional: He is oriented to person, place, and time. He appears well-developed. He appears lethargic. He appears toxic. He appears distressed.  HENT:  Head: Normocephalic and atraumatic.  Neck: Neck supple. No tracheal deviation present.  Cardiovascular: Regular rhythm.  Tachycardia present.  Exam reveals no gallop.   Pulmonary/Chest: He is in respiratory distress. He has decreased breath sounds in the right lower field. He has rales (Bilateral diffuse).  Abdominal: Soft. He exhibits no distension. There is tenderness (Diffusely).  Neurological: He is oriented to person, place, and time. He has normal strength. He appears lethargic. No cranial nerve deficit.  Skin:  Skin is warm and dry. No rash noted. There is pallor.    ED Course  CRITICAL CARE Performed by: Leo Grosser Authorized by: Ernestina Patches, E Total critical care time: 30 minutes Critical care time was exclusive of separately billable procedures and treating other patients. Critical care was necessary to treat or prevent imminent or life-threatening deterioration of the following conditions:  sepsis, respiratory failure, renal failure and shock. Critical care was time spent personally by me on the following activities: development of treatment plan with patient or surrogate, discussions with consultants, interpretation of cardiac output measurements, evaluation of patient's response to treatment, examination of patient, obtaining history from patient or surrogate, ordering and performing treatments and interventions, ordering and review of laboratory studies, ordering and review of radiographic studies, pulse oximetry and re-evaluation of patient's condition.   (including critical care time) Labs Review Labs Reviewed  CBC WITH DIFFERENTIAL - Abnormal; Notable for the following:    RBC 4.15 (*)    MCV 101.4 (*)    Platelets 131 (*)    All other components within normal limits  COMPREHENSIVE METABOLIC PANEL - Abnormal; Notable for the following:    Sodium 136 (*)    Potassium >7.7 (*)    Chloride 94 (*)    CO2 8 (*)    Glucose, Bld 110 (*)    BUN 129 (*)    Creatinine, Ser 9.18 (*)    AST 67 (*)    GFR calc non Af Amer 5 (*)    GFR calc Af Amer 6 (*)    All other components within normal limits  CBC WITH DIFFERENTIAL - Abnormal; Notable for the following:    WBC 20.1 (*)    RBC 4.06 (*)    Hemoglobin 11.7 (*)    HCT 34.8 (*)    RDW 17.1 (*)    Platelets 32 (*)    Lymphocytes Relative 9 (*)    Monocytes Relative 15 (*)    Neutro Abs 15.1 (*)    Monocytes Absolute 3.0 (*)    Basophils Absolute 0.2 (*)    All other components within normal limits  COMPREHENSIVE METABOLIC PANEL - Abnormal; Notable for the following:    Sodium 136 (*)    Potassium >7.7 (*)    Chloride 93 (*)    CO2 8 (*)    Glucose, Bld 120 (*)    BUN 140 (*)    Creatinine, Ser 9.36 (*)    AST 53 (*)    GFR calc non Af Amer 5 (*)    GFR calc Af Amer 6 (*)    All other components within normal limits  CBC WITH DIFFERENTIAL - Abnormal; Notable for the following:    WBC 19.2 (*)    RBC 3.83 (*)     Hemoglobin 10.8 (*)    HCT 32.7 (*)    RDW 17.2 (*)    Platelets 26 (*)    Monocytes Relative 13 (*)    Neutro Abs 14.4 (*)    Monocytes Absolute 2.5 (*)    All other components within normal limits  PRO B NATRIURETIC PEPTIDE - Abnormal; Notable for the following:    Pro B Natriuretic peptide (BNP) 35530.0 (*)    All other components within normal limits  I-STAT CG4 LACTIC ACID, ED - Abnormal; Notable for the following:    Lactic Acid, Venous 8.59 (*)    All other components within normal limits  CBG MONITORING, ED - Abnormal; Notable for the following:  Glucose-Capillary 121 (*)    All other components within normal limits  I-STAT TROPOININ, ED - Abnormal; Notable for the following:    Troponin i, poc 1.65 (*)    All other components within normal limits  I-STAT ARTERIAL BLOOD GAS, ED - Abnormal; Notable for the following:    pH, Arterial 7.246 (*)    pCO2 arterial 21.9 (*)    pO2, Arterial 149.0 (*)    Bicarbonate 9.5 (*)    Acid-base deficit 16.0 (*)    All other components within normal limits  CULTURE, BLOOD (ROUTINE X 2)  CULTURE, BLOOD (ROUTINE X 2)  URINE CULTURE  AMMONIA  URINALYSIS, ROUTINE W REFLEX MICROSCOPIC    Imaging Review Dg Chest Port 1 View  01/13/2014   CLINICAL DATA:  73 year old male with back and knee pain. Shortness of Breath. Initial encounter.  EXAM: PORTABLE CHEST - 1 VIEW  COMPARISON:  11/21/2007.  FINDINGS: Portable AP supine view at 2211 hrs. New confluent bibasilar pulmonary opacity. No pneumothorax. Pulmonary vascularity within normal limits. Stable cardiac size and mediastinal contours. Visualized tracheal air column is within normal limits.  IMPRESSION: New confluent bibasilar pulmonary opacity, nonspecific but consider bilateral pneumonia or aspiration.   Electronically Signed   By: Lars Pinks M.D.   On: 01/13/2014 23:02     EKG Interpretation None      MDM   Final diagnoses:  Sepsis  Hyperkalemia  Acute renal failure  Septic shock   Community acquired pneumonia   73 y.o. male presents with acute renal failure and sepsis. Per the patient's wife he has had vomiting and diarrhea over the last week that has not resolved. He was seen by his primary physician who drew blood today and called him after results came back telling him to go to the nearest hospital. On arrival to the room, the patient is saturating 85% on room air with diffuse rales, appear septic. He is tachycardic and hypotensive with tachypnea and lethargy. His lab values are extremely concerning for metabolic acidosis including lactic acidosis, acute renal failure with hyperkalemia, leukocytosis concerning for infectious etiology.  The patient's blood pressure responded to IV fluid boluses, his respiratory status was improved with BiPAP, his potassium was temporized with calcium gluconate, bicarbonate, IV insulin and D50. EKG is concerning for ischemia or hyperkalemia findings with biphasic T waves and T wave changes in precordial leads and inversions in inferior leads. QRS is not widened. Critical care consult in with level I sepsis, given vancomycin and Zosyn for coverage of bilateral pneumonia given chest x-ray findings.  Admitted to intensive care unit for further monitoring of respiratory status and CRRT.   Leo Grosser, MD 01/14/14 [phone]

## 2014-01-14 ENCOUNTER — Emergency Department (HOSPITAL_COMMUNITY): Payer: Medicare Other

## 2014-01-14 ENCOUNTER — Inpatient Hospital Stay (HOSPITAL_COMMUNITY): Payer: Medicare Other

## 2014-01-14 DIAGNOSIS — E872 Acidosis, unspecified: Secondary | ICD-10-CM | POA: Diagnosis present

## 2014-01-14 DIAGNOSIS — D696 Thrombocytopenia, unspecified: Secondary | ICD-10-CM | POA: Diagnosis present

## 2014-01-14 DIAGNOSIS — D649 Anemia, unspecified: Secondary | ICD-10-CM | POA: Diagnosis present

## 2014-01-14 DIAGNOSIS — R6520 Severe sepsis without septic shock: Secondary | ICD-10-CM

## 2014-01-14 DIAGNOSIS — N179 Acute kidney failure, unspecified: Secondary | ICD-10-CM

## 2014-01-14 DIAGNOSIS — A419 Sepsis, unspecified organism: Secondary | ICD-10-CM

## 2014-01-14 DIAGNOSIS — R6521 Severe sepsis with septic shock: Secondary | ICD-10-CM

## 2014-01-14 DIAGNOSIS — I359 Nonrheumatic aortic valve disorder, unspecified: Secondary | ICD-10-CM

## 2014-01-14 DIAGNOSIS — E875 Hyperkalemia: Secondary | ICD-10-CM | POA: Diagnosis present

## 2014-01-14 DIAGNOSIS — E119 Type 2 diabetes mellitus without complications: Secondary | ICD-10-CM | POA: Diagnosis present

## 2014-01-14 DIAGNOSIS — J9600 Acute respiratory failure, unspecified whether with hypoxia or hypercapnia: Secondary | ICD-10-CM

## 2014-01-14 DIAGNOSIS — J690 Pneumonitis due to inhalation of food and vomit: Secondary | ICD-10-CM

## 2014-01-14 LAB — BLOOD GAS, ARTERIAL
ACID-BASE DEFICIT: 12.7 mmol/L — AB (ref 0.0–2.0)
Bicarbonate: 13.7 mEq/L — ABNORMAL LOW (ref 20.0–24.0)
DELIVERY SYSTEMS: POSITIVE
EXPIRATORY PAP: 8
FIO2: 100 %
Inspiratory PAP: 14
LHR: 10 {breaths}/min
MODE: POSITIVE
O2 SAT: 93.2 %
PATIENT TEMPERATURE: 98.6
TCO2: 14.9 mmol/L (ref 0–100)
pCO2 arterial: 36.2 mmHg (ref 35.0–45.0)
pH, Arterial: 7.204 — ABNORMAL LOW (ref 7.350–7.450)
pO2, Arterial: 76.1 mmHg — ABNORMAL LOW (ref 80.0–100.0)

## 2014-01-14 LAB — CBC WITH DIFFERENTIAL/PLATELET
BASOS ABS: 0 10*3/uL (ref 0.0–0.1)
BASOS PCT: 0 % (ref 0–1)
EOS ABS: 0 10*3/uL (ref 0.0–0.7)
Eosinophils Relative: 0 % (ref 0–5)
HEMATOCRIT: 32.7 % — AB (ref 39.0–52.0)
HEMOGLOBIN: 10.8 g/dL — AB (ref 13.0–17.0)
LYMPHS ABS: 2.3 10*3/uL (ref 0.7–4.0)
Lymphocytes Relative: 12 % (ref 12–46)
MCH: 28.2 pg (ref 26.0–34.0)
MCHC: 33 g/dL (ref 30.0–36.0)
MCV: 85.4 fL (ref 78.0–100.0)
Monocytes Absolute: 2.5 10*3/uL — ABNORMAL HIGH (ref 0.1–1.0)
Monocytes Relative: 13 % — ABNORMAL HIGH (ref 3–12)
NEUTROS ABS: 14.4 10*3/uL — AB (ref 1.7–7.7)
Neutrophils Relative %: 75 % (ref 43–77)
Platelets: 26 10*3/uL — CL (ref 150–400)
RBC: 3.83 MIL/uL — ABNORMAL LOW (ref 4.22–5.81)
RDW: 17.2 % — AB (ref 11.5–15.5)
WBC: 19.2 10*3/uL — ABNORMAL HIGH (ref 4.0–10.5)

## 2014-01-14 LAB — GLUCOSE, CAPILLARY
GLUCOSE-CAPILLARY: 170 mg/dL — AB (ref 70–99)
GLUCOSE-CAPILLARY: 178 mg/dL — AB (ref 70–99)
Glucose-Capillary: 133 mg/dL — ABNORMAL HIGH (ref 70–99)
Glucose-Capillary: 188 mg/dL — ABNORMAL HIGH (ref 70–99)

## 2014-01-14 LAB — RENAL FUNCTION PANEL
ALBUMIN: 2.4 g/dL — AB (ref 3.5–5.2)
Albumin: 2.9 g/dL — ABNORMAL LOW (ref 3.5–5.2)
BUN: 128 mg/dL — ABNORMAL HIGH (ref 6–23)
BUN: 78 mg/dL — AB (ref 6–23)
CALCIUM: 7.9 mg/dL — AB (ref 8.4–10.5)
CO2: 13 mEq/L — ABNORMAL LOW (ref 19–32)
CO2: 20 mEq/L (ref 19–32)
CREATININE: 8.2 mg/dL — AB (ref 0.50–1.35)
CREATININE: 5.23 mg/dL — AB (ref 0.50–1.35)
Calcium: 9.4 mg/dL (ref 8.4–10.5)
Chloride: 97 mEq/L (ref 96–112)
Chloride: 94 mEq/L — ABNORMAL LOW (ref 96–112)
GFR calc Af Amer: 7 mL/min — ABNORMAL LOW (ref >90)
GFR calc Af Amer: 11 mL/min — ABNORMAL LOW (ref >90)
GFR calc non Af Amer: 6 mL/min — ABNORMAL LOW (ref >90)
GFR calc non Af Amer: 10 mL/min — ABNORMAL LOW (ref >90)
GLUCOSE: 126 mg/dL — AB (ref 70–99)
GLUCOSE: 202 mg/dL — AB (ref 70–99)
PHOSPHORUS: 5.2 mg/dL — AB (ref 2.3–4.6)
PHOSPHORUS: 6.5 mg/dL — AB (ref 2.3–4.6)
Potassium: 6.4 mEq/L — ABNORMAL HIGH (ref 3.7–5.3)
Potassium: 4.2 mEq/L (ref 3.7–5.3)
Sodium: 139 mEq/L (ref 137–147)
Sodium: 141 mEq/L (ref 137–147)

## 2014-01-14 LAB — POCT I-STAT 3, ART BLOOD GAS (G3+)
ACID-BASE DEFICIT: 15 mmol/L — AB (ref 0.0–2.0)
ACID-BASE DEFICIT: 16 mmol/L — AB (ref 0.0–2.0)
ACID-BASE DEFICIT: 12 mmol/L — AB (ref 0.0–2.0)
Acid-base deficit: 14 mmol/L — ABNORMAL HIGH (ref 0.0–2.0)
Acid-base deficit: 5 mmol/L — ABNORMAL HIGH (ref 0.0–2.0)
BICARBONATE: 21.8 meq/L (ref 20.0–24.0)
Bicarbonate: 14.1 mEq/L — ABNORMAL LOW (ref 20.0–24.0)
Bicarbonate: 16 mEq/L — ABNORMAL LOW (ref 20.0–24.0)
Bicarbonate: 14.5 mEq/L — ABNORMAL LOW (ref 20.0–24.0)
Bicarbonate: 16.4 mEq/L — ABNORMAL LOW (ref 20.0–24.0)
O2 SAT: 84 %
O2 Saturation: 76 %
O2 Saturation: 84 %
O2 Saturation: 93 %
O2 Saturation: 98 %
PCO2 ART: 40.5 mmHg (ref 35.0–45.0)
PCO2 ART: 49.3 mmHg — AB (ref 35.0–45.0)
PH ART: 7.143 — AB (ref 7.350–7.450)
PH ART: 7.011 — AB (ref 7.350–7.450)
PH ART: 7.255 — AB (ref 7.350–7.450)
PO2 ART: 70 mmHg — AB (ref 80.0–100.0)
TCO2: 15 mmol/L (ref 0–100)
TCO2: 18 mmol/L (ref 0–100)
TCO2: 16 mmol/L (ref 0–100)
TCO2: 18 mmol/L (ref 0–100)
TCO2: 23 mmol/L (ref 0–100)
pCO2 arterial: 63.3 mmHg (ref 35.0–45.0)
pCO2 arterial: 52.5 mmHg — ABNORMAL HIGH (ref 35.0–45.0)
pCO2 arterial: 48.2 mmHg — ABNORMAL HIGH (ref 35.0–45.0)
pH, Arterial: 7.048 — CL (ref 7.350–7.450)
pH, Arterial: 7.139 — CL (ref 7.350–7.450)
pO2, Arterial: 50 mmHg — ABNORMAL LOW (ref 80.0–100.0)
pO2, Arterial: 73 mmHg — ABNORMAL LOW (ref 80.0–100.0)
pO2, Arterial: 89 mmHg (ref 80.0–100.0)
pO2, Arterial: 118 mmHg — ABNORMAL HIGH (ref 80.0–100.0)

## 2014-01-14 LAB — TROPONIN I
Troponin I: 1.44 ng/mL (ref <0.30)
Troponin I: 0.97 ng/mL (ref <0.30)

## 2014-01-14 LAB — CBC
HEMATOCRIT: 31.1 % — AB (ref 39.0–52.0)
HEMOGLOBIN: 10.5 g/dL — AB (ref 13.0–17.0)
MCH: 28.5 pg (ref 26.0–34.0)
MCHC: 33.8 g/dL (ref 30.0–36.0)
MCV: 84.3 fL (ref 78.0–100.0)
Platelets: 31 10*3/uL — ABNORMAL LOW (ref 150–400)
RBC: 3.69 MIL/uL — AB (ref 4.22–5.81)
RDW: 17.4 % — ABNORMAL HIGH (ref 11.5–15.5)
WBC: 19.5 10*3/uL — ABNORMAL HIGH (ref 4.0–10.5)

## 2014-01-14 LAB — PROCALCITONIN: PROCALCITONIN: 1.62 ng/mL

## 2014-01-14 LAB — LACTIC ACID, PLASMA: Lactic Acid, Venous: 4.9 mmol/L — ABNORMAL HIGH (ref 0.5–2.2)

## 2014-01-14 LAB — MAGNESIUM: MAGNESIUM: 1.8 mg/dL (ref 1.5–2.5)

## 2014-01-14 MED ORDER — HYDROCORTISONE NA SUCCINATE PF 100 MG IJ SOLR
50.0000 mg | Freq: Four times a day (QID) | INTRAMUSCULAR | Status: DC
  Administered 2014-01-14 – 2014-01-18 (×16): 50 mg via INTRAVENOUS
  Filled 2014-01-14 (×20): qty 1

## 2014-01-14 MED ORDER — MIDAZOLAM BOLUS VIA INFUSION
2.0000 mg | INTRAVENOUS | Status: DC | PRN
  Filled 2014-01-14: qty 2

## 2014-01-14 MED ORDER — MIDAZOLAM HCL 2 MG/2ML IJ SOLN: 1.0000 mg | INTRAMUSCULAR | Status: DC | PRN

## 2014-01-14 MED ORDER — MIDAZOLAM HCL 2 MG/2ML IJ SOLN
INTRAMUSCULAR | Status: AC
  Administered 2014-01-14: 2 mg via INTRAVENOUS
  Filled 2014-01-14: qty 4

## 2014-01-14 MED ORDER — SODIUM CHLORIDE 0.9 % IV SOLN: 250.0000 mL | INTRAVENOUS | Status: DC | PRN

## 2014-01-14 MED ORDER — SODIUM CHLORIDE 0.9 % IV SOLN
3.0000 ug/kg/min | INTRAVENOUS | Status: DC
  Filled 2014-01-14: qty 20

## 2014-01-14 MED ORDER — FENTANYL CITRATE 0.05 MG/ML IJ SOLN
100.0000 ug | Freq: Once | INTRAMUSCULAR | Status: AC
  Administered 2014-01-14: 100 ug via INTRAVENOUS

## 2014-01-14 MED ORDER — BIOTENE DRY MOUTH MT LIQD
15.0000 mL | Freq: Four times a day (QID) | OROMUCOSAL | Status: DC
  Administered 2014-01-14 – 2014-01-22 (×31): 15 mL via OROMUCOSAL

## 2014-01-14 MED ORDER — SODIUM BICARBONATE 8.4 % IV SOLN
INTRAVENOUS | Status: AC
  Administered 2014-01-14: 50 meq via INTRAVENOUS
  Filled 2014-01-14: qty 50

## 2014-01-14 MED ORDER — HEPARIN SODIUM (PORCINE) 1000 UNIT/ML DIALYSIS
1000.0000 [IU] | INTRAMUSCULAR | Status: DC | PRN
  Administered 2014-01-19: 2800 [IU] via INTRAVENOUS_CENTRAL
  Filled 2014-01-14: qty 3

## 2014-01-14 MED ORDER — SODIUM BICARBONATE 8.4 % IV SOLN
100.0000 meq | Freq: Once | INTRAVENOUS | Status: AC
  Administered 2014-01-14: 50 meq via INTRAVENOUS
  Filled 2014-01-14: qty 100

## 2014-01-14 MED ORDER — FENTANYL CITRATE 0.05 MG/ML IJ SOLN
INTRAMUSCULAR | Status: AC
  Administered 2014-01-14: 100 ug via INTRAVENOUS
  Filled 2014-01-14: qty 4

## 2014-01-14 MED ORDER — PRISMASOL BGK 0/2.5 32-2.5 MEQ/L IV SOLN
INTRAVENOUS | Status: DC
  Administered 2014-01-14 – 2014-01-15 (×4): via INTRAVENOUS_CENTRAL
  Filled 2014-01-14 (×7): qty 5000

## 2014-01-14 MED ORDER — SODIUM CHLORIDE 0.9 % IV SOLN
INTRAVENOUS | Status: DC
  Administered 2014-01-14: via INTRAVENOUS

## 2014-01-14 MED ORDER — ARTIFICIAL TEARS OP OINT
1.0000 "application " | TOPICAL_OINTMENT | Freq: Three times a day (TID) | OPHTHALMIC | Status: DC
  Filled 2014-01-14: qty 3.5

## 2014-01-14 MED ORDER — MIDAZOLAM HCL 5 MG/ML IJ SOLN: 2.0000 mg | Freq: Once | INTRAMUSCULAR | Status: DC

## 2014-01-14 MED ORDER — MIDAZOLAM HCL 2 MG/2ML IJ SOLN
2.0000 mg | Freq: Once | INTRAMUSCULAR | Status: AC
  Administered 2014-01-14: 2 mg via INTRAVENOUS

## 2014-01-14 MED ORDER — FENTANYL CITRATE 0.05 MG/ML IJ SOLN: 50.0000 ug | INTRAMUSCULAR | Status: DC | PRN

## 2014-01-14 MED ORDER — SODIUM CHLORIDE 0.9 % IV SOLN
0.0000 ug/h | INTRAVENOUS | Status: DC
  Administered 2014-01-14: 50 ug/h via INTRAVENOUS
  Administered 2014-01-15: 150 ug/h via INTRAVENOUS
  Administered 2014-01-16 – 2014-01-18 (×3): 100 ug/h via INTRAVENOUS
  Filled 2014-01-14 (×5): qty 50

## 2014-01-14 MED ORDER — SODIUM BICARBONATE 8.4 % IV SOLN
INTRAVENOUS | Status: DC
  Administered 2014-01-14: 10:00:00 via INTRAVENOUS
  Filled 2014-01-14 (×6): qty 150

## 2014-01-14 MED ORDER — FENTANYL BOLUS VIA INFUSION
25.0000 ug | INTRAVENOUS | Status: DC | PRN
  Filled 2014-01-14: qty 50

## 2014-01-14 MED ORDER — PIPERACILLIN-TAZOBACTAM 3.375 G IVPB
3.3750 g | Freq: Four times a day (QID) | INTRAVENOUS | Status: DC
  Administered 2014-01-14 – 2014-01-15 (×4): 3.375 g via INTRAVENOUS
  Filled 2014-01-14 (×6): qty 50

## 2014-01-14 MED ORDER — ROCURONIUM BROMIDE 50 MG/5ML IV SOLN
50.0000 mg | Freq: Once | INTRAVENOUS | Status: AC
  Administered 2014-01-14: 50 mg via INTRAVENOUS

## 2014-01-14 MED ORDER — CISATRACURIUM BOLUS VIA INFUSION
0.0500 mg/kg | Freq: Once | INTRAVENOUS | Status: DC
  Filled 2014-01-14: qty 5

## 2014-01-14 MED ORDER — FENTANYL CITRATE 0.05 MG/ML IJ SOLN: 50.0000 ug | Freq: Once | INTRAMUSCULAR | Status: DC

## 2014-01-14 MED ORDER — SODIUM BICARBONATE 8.4 % IV SOLN
INTRAVENOUS | Status: AC
  Administered 2014-01-14: 50 meq via INTRAVENOUS
  Filled 2014-01-14: qty 100

## 2014-01-14 MED ORDER — SODIUM BICARBONATE 8.4 % IV SOLN
50.0000 meq | Freq: Once | INTRAVENOUS | Status: AC
Start: 2014-01-14 — End: 2014-01-14
  Administered 2014-01-14: 50 meq via INTRAVENOUS
  Filled 2014-01-14: qty 50

## 2014-01-14 MED ORDER — VANCOMYCIN HCL IN DEXTROSE 1-5 GM/200ML-% IV SOLN
1000.0000 mg | INTRAVENOUS | Status: DC
  Administered 2014-01-15 – 2014-01-18 (×4): 1000 mg via INTRAVENOUS
  Filled 2014-01-14 (×4): qty 200

## 2014-01-14 MED ORDER — PRISMASOL BGK 0/2.5 32-2.5 MEQ/L IV SOLN
INTRAVENOUS | Status: DC
  Administered 2014-01-14 – 2014-01-15 (×4): via INTRAVENOUS_CENTRAL
  Filled 2014-01-14 (×7): qty 5000

## 2014-01-14 MED ORDER — PANTOPRAZOLE SODIUM 40 MG IV SOLR
40.0000 mg | Freq: Every day | INTRAVENOUS | Status: DC
  Administered 2014-01-14 – 2014-01-16 (×3): 40 mg via INTRAVENOUS
  Filled 2014-01-14 (×4): qty 40

## 2014-01-14 MED ORDER — PRISMASOL BGK 0/2.5 32-2.5 MEQ/L IV SOLN
INTRAVENOUS | Status: DC
  Administered 2014-01-14: 03:00:00 via INTRAVENOUS_CENTRAL
  Filled 2014-01-14 (×5): qty 5000

## 2014-01-14 MED ORDER — PRISMASOL BGK 0/2.5 32-2.5 MEQ/L IV SOLN
INTRAVENOUS | Status: DC
  Administered 2014-01-14 – 2014-01-15 (×6): via INTRAVENOUS_CENTRAL
  Filled 2014-01-14 (×17): qty 5000

## 2014-01-14 MED ORDER — VANCOMYCIN HCL 500 MG IV SOLR
500.0000 mg | Freq: Once | INTRAVENOUS | Status: AC
  Administered 2014-01-14: 500 mg via INTRAVENOUS
  Filled 2014-01-14: qty 500

## 2014-01-14 MED ORDER — CHLORHEXIDINE GLUCONATE 0.12 % MT SOLN
15.0000 mL | Freq: Two times a day (BID) | OROMUCOSAL | Status: DC
  Administered 2014-01-14 – 2014-01-22 (×16): 15 mL via OROMUCOSAL
  Filled 2014-01-14 (×14): qty 15

## 2014-01-14 MED ORDER — SODIUM CHLORIDE 0.9 % IV SOLN
1.0000 mg/h | INTRAVENOUS | Status: DC
  Administered 2014-01-14 (×2): 4 mg/h via INTRAVENOUS
  Administered 2014-01-15: 2 mg/h via INTRAVENOUS
  Filled 2014-01-14 (×4): qty 10

## 2014-01-14 MED ORDER — NOREPINEPHRINE BITARTRATE 1 MG/ML IV SOLN
2.0000 ug/min | INTRAVENOUS | Status: DC
  Administered 2014-01-14: 50 ug/min via INTRAVENOUS
  Administered 2014-01-14: 10 ug/min via INTRAVENOUS
  Administered 2014-01-14: 50 ug/min via INTRAVENOUS
  Administered 2014-01-15: 15 ug/min via INTRAVENOUS
  Administered 2014-01-16: 13 ug/min via INTRAVENOUS
  Filled 2014-01-14 (×9): qty 16

## 2014-01-14 MED ORDER — EPINEPHRINE HCL 1 MG/ML IJ SOLN
0.5000 ug/min | INTRAVENOUS | Status: DC
  Administered 2014-01-14: 1 ug/min via INTRAVENOUS
  Administered 2014-01-14: 3 ug/min via INTRAVENOUS
  Filled 2014-01-14 (×3): qty 1

## 2014-01-14 MED ORDER — ETOMIDATE 2 MG/ML IV SOLN
20.0000 mg | Freq: Once | INTRAVENOUS | Status: AC
  Administered 2014-01-14: 20 mg via INTRAVENOUS

## 2014-01-14 MED ORDER — ASPIRIN 81 MG PO CHEW: 324.0000 mg | CHEWABLE_TABLET | ORAL | Status: AC

## 2014-01-14 MED ORDER — ASPIRIN 300 MG RE SUPP: 300.0000 mg | RECTAL | Status: AC

## 2014-01-14 MED ORDER — VASOPRESSIN 20 UNIT/ML IJ SOLN
0.0300 [IU]/min | INTRAVENOUS | Status: DC
  Administered 2014-01-14: 0.03 [IU]/min via INTRAVENOUS
  Filled 2014-01-14 (×2): qty 2.5

## 2014-01-14 MED ORDER — INSULIN ASPART 100 UNIT/ML ~~LOC~~ SOLN
2.0000 [IU] | SUBCUTANEOUS | Status: DC
  Administered 2014-01-14: 2 [IU] via SUBCUTANEOUS
  Administered 2014-01-14 (×3): 4 [IU] via SUBCUTANEOUS
  Administered 2014-01-15 (×3): 2 [IU] via SUBCUTANEOUS
  Administered 2014-01-15: 6 [IU] via SUBCUTANEOUS
  Administered 2014-01-15 – 2014-01-16 (×2): 2 [IU] via SUBCUTANEOUS
  Administered 2014-01-16: 4 [IU] via SUBCUTANEOUS

## 2014-01-14 NOTE — Progress Notes (Signed)
ANTIBIOTIC CONSULT NOTE - FOLLOW UP  Pharmacy Consult for vancomycin Indication: sepsis  No Known Allergies  Patient Measurements: Height: 5\' 11"  (180.3 cm) Weight: 202 lb 13.2 oz (92 kg) IBW/kg (Calculated) : 75.3   Vital Signs: Temp: 97.4 F (36.3 C) (05/28 1100) Temp src: Oral (05/28 1100) BP: 110/92 mmHg (05/28 1300) Pulse Rate: 90 (05/28 1300) Intake/Output from previous day: 05/27 0701 - 05/28 0700 In: 2020 [I.V.:2020] Out: 246  Intake/Output from this shift: Total I/O In: 447.2 [I.V.:347.2; IV Piggyback:100] Out: 33 [Other:33]  Labs:  Recent Labs  01/13/14 1949 01/13/14 2125 01/13/14 2334 01/14/14 0428  WBC 6.4  20.1* 19.2*  --  19.5*  HGB 13.8  11.7* 10.8* 10.2* 10.5*  PLT 131*  32* 26*  --  31*  CREATININE 9.18* 9.36* 10.50* 8.20*   Estimated Creatinine Clearance: 9.4 ml/min (by C-G formula based on Cr of 8.2). No results found for this basename: VANCOTROUGH, VANCOPEAK, VANCORANDOM, GENTTROUGH, GENTPEAK, GENTRANDOM, TOBRATROUGH, TOBRAPEAK, TOBRARND, AMIKACINPEAK, AMIKACINTROU, AMIKACIN,  in the last 72 hours   Microbiology: No results found for this or any previous visit (from the past 720 hour(s)).  Anti-infectives   Start     Dose/Rate Route Frequency Ordered Stop   01/14/14 0815  vancomycin (VANCOCIN) 500 mg in sodium chloride 0.9 % 100 mL IVPB     500 mg 100 mL/hr over 60 Minutes Intravenous  Once 01/14/14 0810 01/14/14 1104   01/14/14 0600  piperacillin-tazobactam (ZOSYN) IVPB 2.25 g  Status:  Discontinued     2.25 g 100 mL/hr over 30 Minutes Intravenous 3 times per day 01/13/14 2239 01/14/14 0158   01/14/14 0600  piperacillin-tazobactam (ZOSYN) IVPB 3.375 g     3.375 g 12.5 mL/hr over 240 Minutes Intravenous 4 times per day 01/14/14 0158     01/13/14 2300  vancomycin (VANCOCIN) 500 mg in sodium chloride 0.9 % 100 mL IVPB  Status:  Discontinued     500 mg 100 mL/hr over 60 Minutes Intravenous  Once 01/13/14 2239 01/14/14 0810   01/13/14  2200  piperacillin-tazobactam (ZOSYN) IVPB 3.375 g     3.375 g 100 mL/hr over 30 Minutes Intravenous  Once 01/13/14 2159 01/13/14 2250   01/13/14 2200  vancomycin (VANCOCIN) IVPB 1000 mg/200 mL premix     1,000 mg 200 mL/hr over 60 Minutes Intravenous  Once 01/13/14 2159 01/13/14 2324      Assessment: 73 yo male with VDRF started on vancomycin and zosyn for r/o sepsis. Patient noted on levophed and vasopressin and now on nimbex for vent synchrony.  WBC= 19.5, afebrile, LA= 4.9, PCT= 1.62, SCr= 8.2 (acute kidney injury; last SCr was 1.29 in 2009). Patient noted on CRRT.   Goal of Therapy:  Vancomycin trough level 15-20 mcg/ml  Plan:  -Vancomycin 1000mg  IV q24hr -No zosyn changes needed -Will follow renal function, cultures and clinical progress  Hildred Laser, Pharm D 01/14/2014 1:42 PM

## 2014-01-14 NOTE — Progress Notes (Signed)
Difficult to oxygenate on 100% FIO2 rehecked CXR - worsening infx s/o ARDS, frothy bloody sputum s/o acute pulmnoary edema Titrated PEEP at bedside to 18 over 30 mins with improvement in satn However ABG shows combination of resp + metabolic acidosis - increased RR to 30, added bicarb gtt CVVH ongoing. Vent asynchrony persisted on fent gtt Add versed gtt & nimbex with goal - vent synchrony.  Hypotensive on levophed gtt, added vaso gtt, stress dose steroids.  Attempted to reach wife to update guarded prognosis  Additional cc time x 60 mins  Kara Mead MD. FCCP. [facility] Pager [phone] If no response call [phone]

## 2014-01-14 NOTE — Procedures (Signed)
Arterial Catheter Insertion Procedure Note JELAN BATTERTON [id number] 07-22-1941  Procedure: Insertion of Arterial Catheter  Indications: Blood pressure monitoring and Frequent blood sampling  Procedure Details Consent: Unable to obtain consent because of emergent medical necessity. Time Out: Verified patient identification, verified procedure, site/side was marked, verified correct patient position, special equipment/implants available, medications/allergies/relevent history reviewed, required imaging and test results available.  Performed  Maximum sterile technique was used including antiseptics, cap, gloves, gown, hand hygiene, mask and sheet. Skin prep: Chlorhexidine; local anesthetic administered 20 gauge catheter was inserted into right radial artery using the Seldinger technique.  Evaluation Blood flow good; BP tracing good. Complications: No apparent complications.   Richardson Landry Minor ACNP [facility] Shape PCCM Pager [phone] till 3 pm If no answer page [phone]  Supervised by me  Rigoberto Noel MD 01/14/2014, 8:02 AM

## 2014-01-14 NOTE — ED Notes (Signed)
US at the bedside

## 2014-01-14 NOTE — ED Provider Notes (Signed)
Medical screening examination/treatment/procedure(s) were conducted as a shared visit with resident-physician practitioner(s) and myself.  I personally evaluated the patient during the encounter.  Pt is a 73 y.o. male with pmhx as above presenting with about 1 week of n/v/d, dec UOP.  Pt found to have severe septic shock, likely due to pna.  He also has severe ARF, hyperkalemia, acute hypoxic resp failure.  On PE, pt toxic appearing with dec LOC, hypotension, pallor.  Broad spectrum abx, IVF boluses given.  Pt will be admitted to ICU, will be started on CRRT.    CRITICAL CARE Performed by: Neta Ehlers Total critical care time: 35 Critical care time was exclusive of separately billable procedures and treating other patients. Critical care was necessary to treat or prevent imminent or life-threatening deterioration. Critical care was time spent personally by me on the following activities: development of treatment plan with patient and/or surrogate as well as nursing, discussions with consultants, evaluation of patient's response to treatment, examination of patient, obtaining history from patient or surrogate, ordering and performing treatments and interventions, ordering and review of laboratory studies, ordering and review of radiographic studies, pulse oximetry and re-evaluation of patient's condition.     EKG Interpretation  Date/Time:  Wednesday Jan 13 2014 21:17:08 EDT Ventricular Rate:  93 PR Interval:  190 QRS Duration: 114 QT Interval:  358 QTC Calculation: 445 R Axis:   -63 Text Interpretation:  Sinus rhythm Left anterior fascicular block Probable left ventricular hypertrophy Abnrm T, consider ischemia, anterolateral lds ST elevation, consider anterior injury Confirmed by Ezra Marquess  MD, Ysidro Ramsay ([id number]) on 01/14/2014 1:17:34 AM        Neta Ehlers, MD 01/14/14 0120

## 2014-01-14 NOTE — Progress Notes (Signed)
Utilization review completed. Torrie Namba, RN, BSN. 

## 2014-01-14 NOTE — Procedures (Signed)
Hemodialysis Catheter Insertion Procedure Note [name] [id number] 1940-08-23  Procedure: Insertion of Hemodialysis Catheter Indications: Acute renal failure with hyperkalemia and acidosis  Procedure Details Consent: Risks of procedure as well as the alternatives and risks of each were explained to the (patient/caregiver).  Consent for procedure obtained. Time Out: Verified patient identification, verified procedure, site/side was marked, verified correct patient position, special equipment/implants available, medications/allergies/relevent history reviewed, required imaging and test results available.  Performed  Maximum sterile technique was used including antiseptics, cap, gloves, gown, hand hygiene, mask and sheet. Skin prep: Chlorhexidine; local anesthetic administered A antimicrobial bonded/coated triple lumen catheter was placed in the left internal jugular vein using the Seldinger technique.  Evaluation Blood flow good Complications: No apparent complications Patient did tolerate procedure well. Chest X-ray ordered to verify placement.  CXR: pending.  Performed with ultrasound guidance.  Performed by [name], ACNP.  I was present for entire procedure.  [name], MD [facility] Pulmonary/Critical Care 01/14/2014, 2:21 AM Pager:  [phone] After 3pm call: [phone]

## 2014-01-14 NOTE — Procedures (Signed)
Central Venous Catheter Insertion Procedure Note [name] [id number] April 09, 1941  Procedure: Insertion of Central Venous Catheter Indications: Assessment of intravascular volume, Drug and/or fluid administration and Frequent blood sampling  Procedure Details Consent: Risks of procedure as well as the alternatives and risks of each were explained to the (patient/caregiver).  Consent for procedure obtained. Time Out: Verified patient identification, verified procedure, site/side was marked, verified correct patient position, special equipment/implants available, medications/allergies/relevent history reviewed, required imaging and test results available.  Performed  Maximum sterile technique was used including antiseptics, cap, gloves, gown, hand hygiene, mask and sheet. Skin prep: Chlorhexidine; local anesthetic administered A antimicrobial bonded/coated triple lumen catheter was placed in the right internal jugular vein using the Seldinger technique. Ultrasound guidance used.yes Catheter placed to 17 cm. Blood aspirated via all 3 ports and then flushed x 3. Line sutured x 3 and dressing applied.  Evaluation Blood flow good Complications: No apparent complications Patient did tolerate procedure well. Chest X-ray ordered to verify placement.  CXR: confirmed position  [facility] Minor ACNP Maryanna Shape PCCM Pager [phone] till 3 pm If no answer page [phone]  Supervised by me Rigoberto Noel MD  01/14/2014, 8:50 AM

## 2014-01-14 NOTE — Procedures (Signed)
Intubation Procedure Note [name] [id number] [birth year]  Procedure: Intubation Indications: Respiratory insufficiency  Procedure Details Consent: Unable to obtain consent because of emergent medical necessity. Time Out: Verified patient identification, verified procedure, site/side was marked, verified correct patient position, special equipment/implants available, medications/allergies/relevent history reviewed, required imaging and test results available.  Performed  MAC and 3 Medications:  Fentanyl 100 mcg Etomidate 10 mg Versed 2 mg NMB    Evaluation Hemodynamic Status: Persistent hypotension treated with pressors; O2 sats: transiently fell during during procedure Patient's Current Condition: unstable Complications: No apparent complications Patient did tolerate procedure well. Chest X-ray ordered to verify placement.  CXR: pending.   Richardson Landry Minor ACNP Maryanna Shape PCCM Pager [phone] till 3 pm If no answer page [phone]  Supervised by me  Rigoberto Noel MD  01/14/2014, 8:01 AM

## 2014-01-14 NOTE — Progress Notes (Addendum)
PULMONARY / CRITICAL CARE MEDICINE   Name: [name] MRN: [id number] DOB: [birth year]    ADMISSION DATE:  01/13/2014 CONSULTATION DATE:  01/13/2014  REFERRING MD :  EDP PRIMARY SERVICE: PCCM  CHIEF COMPLAINT:  Back pain  BRIEF PATIENT DESCRIPTION: 73 year old male with HTN and DM presented 5/27 to ED c/o N/V since 5/25. In ED found to be in acute renal failure, hyperkalemic, hypotensive with SBP's in 70s. Started on BiPAP for hypoxia and increased WOB. PCCM asked to admit.   SIGNIFICANT EVENTS / STUDIES:  2/27 admitted  LINES / TUBES: Foley 5/27 >>> PIV  CULTURES: Blood 5/27 >>> Urine 5/27 >>> Sputum 5/27 >>>  ANTIBIOTICS: Zosyn 5/27 >>> Vancomycin 5/27 >>>   SUBJECTIVE: Hypoxic on 100% Bipap Increased wob Denies CP  VITAL SIGNS: Temp:  [97.1 F (36.2 C)-97.9 F (36.6 C)] 97.4 F (36.3 C) (05/28 1100) Pulse Rate:  [84-114] 84 (05/28 1211) Resp:  [13-38] 38 (05/28 1211) BP: (69-124)/(42-68) 90/44 mmHg (05/28 1211) SpO2:  [85 %-100 %] 91 % (05/28 1211) Arterial Line BP: (82-160)/(36-85) 85/45 mmHg (05/28 1115) FiO2 (%):  [75 %-100 %] 100 % (05/28 1211) Weight:  [87.091 kg (192 lb)-92 kg (202 lb 13.2 oz)] 92 kg (202 lb 13.2 oz) (05/28 0140) HEMODYNAMICS: CVP:  [16 mmHg] 16 mmHg VENTILATOR SETTINGS: Vent Mode:  [-] PRVC FiO2 (%):  [75 %-100 %] 100 % Set Rate:  [20 bmp-30 bmp] 30 bmp Vt Set:  [550 mL] 550 mL PEEP:  [12 cmH20-18 cmH20] 18 cmH20 Plateau Pressure:  [28 cmH20] 28 cmH20 INTAKE / OUTPUT: Intake/Output     05/27 0701 - 05/28 0700 05/28 0701 - 05/29 0700   I.V. (mL/kg) 2020 (22)    Total Intake(mL/kg) 2020 (22)    Other 246 55   Total Output 246 55   Net +1774 -55          PHYSICAL EXAMINATION: General:  Elderly male on BiPAP in no acute distress. Neuro:  Alert, oriented x 3. Non focal HEENT:  PERRL Cardiovascular: Intermittently tachy, regular Lungs:  Scattered rales Abdomen:  Soft, non-tender, non-distended Musculoskeletal:  No acute  deformity Skin:  Intact  LABS:  CBC  Recent Labs Lab 01/13/14 1949 01/13/14 2125 01/13/14 2334 01/14/14 0428  WBC 6.4  20.1* 19.2*  --  19.5*  HGB 13.8  11.7* 10.8* 10.2* 10.5*  HCT 42.1  34.8* 32.7* 30.0* 31.1*  PLT 131*  32* 26*  --  31*   Coag's No results found for this basename: APTT, INR,  in the last 168 hours BMET  Recent Labs Lab 01/13/14 1949 01/13/14 2125 01/13/14 2334 01/14/14 0428  NA 136* 136* 138 139  K >7.7* >7.7* 6.6* 6.4*  CL 94* 93* 105 97  CO2 8* 8*  --  13*  BUN 129* 140* >140* 128*  CREATININE 9.18* 9.36* 10.50* 8.20*  GLUCOSE 110* 120* 156* 126*   Electrolytes  Recent Labs Lab 01/13/14 1949 01/13/14 2125 01/14/14 0428  CALCIUM 10.4 10.3 9.4  MG  --   --  1.8  PHOS  --   --  5.2*   Sepsis Markers  Recent Labs Lab 01/13/14 2137 01/14/14 0007  LATICACIDVEN 8.59* 4.9*  PROCALCITON  --  1.62   ABG  Recent Labs Lab 01/14/14 0808 01/14/14 0958 01/14/14 1152  PHART 7.143* 7.011* 7.048*  PCO2ART 40.5 63.3* 52.5*  PO2ART 50.0* 73.0* 70.0*   Liver Enzymes  Recent Labs Lab 01/13/14 1949 01/13/14 2125 01/14/14 0428  AST 67*  53*  --   ALT 21 19  --   ALKPHOS 85 81  --   BILITOT 1.1 0.9  --   ALBUMIN 3.6 3.5 2.9*   Cardiac Enzymes  Recent Labs Lab 01/13/14 2125 01/14/14 0428  TROPONINI  --  1.44*  PROBNP 35530.0*  --    Glucose  Recent Labs Lab 01/13/14 2117 01/13/14 2340 01/14/14 0334 01/14/14 1104  GLUCAP 121* 150* 133* 170*    Imaging US Renal  01/14/2014   CLINICAL DATA:  Renal failure, history hypertension, diabetes  EXAM: RENAL/URINARY TRACT ULTRASOUND COMPLETE  COMPARISON:  None  FINDINGS: Right Kidney:  Length: 12.4 cm length. Cortical thickness up to 1.8 cm thick. Increased cortical echogenicity. Hypoechoic nodule medial aspect mid RIGHT kidney 2.1 x 2.2 x 2.1 cm containing scattered internal echoes question complicated cyst. No additional mass, hydronephrosis or shadowing calcification.  Left  Kidney:  Length: 10.5 cm length. Cortical thickness up to 2.0 cm thick. Increased cortical echogenicity. Minimally complicated cyst at mid LEFT kidney 2.7 x 3.3 x 3.7 cm containing slightly irregular wall and scattered internal echoes. No additional mass, hydronephrosis or shadowing calcification.  Bladder:  Contains only minimal urine. Minimally enlarged central lobe of prostate.  IMPRESSION: Increased renal cortical echogenicity bilaterally consistent with medical renal disease.  Mildly complicated cysts within the mid portions of both kidneys.  No evidence of hydronephrosis.   Electronically Signed   By: Lavonia Dana M.D.   On: 01/14/2014 02:31   Dg Chest Port 1 View  01/14/2014   CLINICAL DATA:  Status post central line placement  EXAM: PORTABLE CHEST - 1 VIEW  COMPARISON:  01/14/2014 225 hr  FINDINGS: A left jugular dual-lumen central line is again identified and stable. A new right jugular central line is seen with the catheter tip at the cavoatrial junction. No pneumothorax is noted. The endotracheal tube is seen 5 cm above the carina. A nasogastric catheter is noted extending into the stomach. Bilateral lower lung infiltrates are seen. The overall appearance is stable. No new focal abnormality is noted.  IMPRESSION: Stable bilateral infiltrates.  New tubes and lines as described above without complicating factors.   Electronically Signed   By: Inez Catalina M.D.   On: 01/14/2014 09:33   Dg Chest Port 1 View  01/14/2014   CLINICAL DATA:  Hemodialysis catheter placement  EXAM: Portable exam 0225 hr compared to 01/13/2014  COMPARISON:  None.  FINDINGS: LEFT jugular dual-lumen central venous catheter with tip projecting over mid SVC.  Enlargement of cardiac silhouette with pulmonary vascular congestion.  Perihilar and basilar infiltrates which could represent edema or infection.  No pneumothorax.  Bones unremarkable.  IMPRESSION: No pneumothorax following LEFT jugular line placement.  Enlargement of cardiac  silhouette with pulmonary vascular congestion and diffuse increased pulmonary infiltrates, question edema versus infection.   Electronically Signed   By: Lavonia Dana M.D.   On: 01/14/2014 02:41   Dg Chest Port 1 View  01/13/2014   CLINICAL DATA:  73 year old male with back and knee pain. Shortness of Breath. Initial encounter.  EXAM: PORTABLE CHEST - 1 VIEW  COMPARISON:  11/21/2007.  FINDINGS: Portable AP supine view at 2211 hrs. New confluent bibasilar pulmonary opacity. No pneumothorax. Pulmonary vascularity within normal limits. Stable cardiac size and mediastinal contours. Visualized tracheal air column is within normal limits.  IMPRESSION: New confluent bibasilar pulmonary opacity, nonspecific but consider bilateral pneumonia or aspiration.   Electronically Signed   By: Lars Pinks M.D.   On:  01/13/2014 23:02    CXR: worsening BL infiltrates  ASSESSMENT / PLAN:  PULMONARY A: Acute Hypoxemic Respiratory Failure  CAP vs Aspiration PNA vs pulmonary edema  P:   Intubate ARDS ventilation   CARDIOVASCULAR A:  Hypotension secondary to severe sepsis (responded well to 2L NS) NSTEMI - ?ECG changes, no comparison study H/o HTN  P:  2D echo Trend troponin  RENAL A:   Acute renal failure - concern for NSAID induced nephropathy, also takes ACE-i Hyperkalemia > received insulin, calcium in ED High AG metabolic acidosis - on metformin at home Fluid volume overload  P:   CRRT per nephrology KVO IVF Monitor BMP  GASTROINTESTINAL A:   Nausea/Vomiting - currently no complaints  P:   NPO Start TFs in 24h once hemodynamically stable  HEMATOLOGIC A:   Anemia, appears acute Leukocytosis Thrombocytopenia likely in setting of chronic illness VTE prophylaxis  P:  Follow CBC SCD's  INFECTIOUS A:   Septic shock possible etiology CAP vs Aspiration PNA.   P:   Abx and cultures as above Trend PCT 1.6 Trend Fever curve and WBC  ENDOCRINE A:   DM without hyperglycemia -  takes metformin at home.    P:   CBG monitoring and SSI  NEUROLOGIC A:  Chronic low back pain    P:   Hold NSAIDs fent gtt   Summary -  73 yo male presents with hypotension, acute on ?chronic renal failure, metabolic acidosis, hyperkalemia, thrombocytopenia.  Reports frequent NSAID use, and is on ACE inhibitor and metformin.  Has infiltrate on CXR suggestive of aspiration pneumonitis.  Care during the described time interval was provided by me and/or other providers on the critical care team.  I have reviewed this patient's available data, including medical history, events of note, physical examination and test results as part of my evaluation   The patient is critically ill with multiple organ systems failure and requires high complexity decision making for assessment and support, frequent evaluation and titration of therapies, application of advanced monitoring technologies and extensive interpretation of multiple databases. Critical Care Time devoted to patient care services described in this note is 60 minutes.    Kara Mead MD. [facility] Flood. [place] [facility] Pager [phone] If no response call [phone]

## 2014-01-14 NOTE — Progress Notes (Signed)
Attempted to wean oxygen to 50%,but patients Sp02 level fell to 89-91%. Increased fio2 back to 75% on Bipap with settings set at IPAP=14 and EPAP=8

## 2014-01-14 NOTE — Progress Notes (Signed)
Subjective: Interval History: getting entub  Objective: Vital signs in last 24 hours: Temp:  [97.1 F (36.2 C)-97.9 F (36.6 C)] 97.1 F (36.2 C) (05/28 0336) Pulse Rate:  [89-114] 98 (05/28 0600) Resp:  [20-34] 31 (05/28 0600) BP: (69-124)/(42-68) 100/59 mmHg (05/28 0600) SpO2:  [85 %-100 %] 91 % (05/28 0600) FiO2 (%):  [75 %] 75 % (05/28 0135) Weight:  [87.091 kg (192 lb)-92 kg (202 lb 13.2 oz)] 92 kg (202 lb 13.2 oz) (05/28 0140) Weight change:   Intake/Output from previous day: 05/27 0701 - 05/28 0700 In: 2020 [I.V.:2020] Out: 246  Intake/Output this shift:    General appearance: pale and sedated Resp: rales bilaterally and rhonchi bilaterally Cardio: S1, S2 normal and systolic murmur: holosystolic 2/6, blowing at apex GI: few bs, soft Extremities: edema 1+  Lab Results:  Recent Labs  01/13/14 2125 01/13/14 2334 01/14/14 0428  WBC 19.2*  --  19.5*  HGB 10.8* 10.2* 10.5*  HCT 32.7* 30.0* 31.1*  PLT 26*  --  31*   BMET:  Recent Labs  01/13/14 2125 01/13/14 2334 01/14/14 0428  NA 136* 138 139  K >7.7* 6.6* 6.4*  CL 93* 105 97  CO2 8*  --  13*  GLUCOSE 120* 156* 126*  BUN 140* >140* 128*  CREATININE 9.36* 10.50* 8.20*  CALCIUM 10.3  --  9.4   No results found for this basename: PTH,  in the last 72 hours Iron Studies: No results found for this basename: IRON, TIBC, TRANSFERRIN, FERRITIN,  in the last 72 hours  Studies/Results: US Renal  01/14/2014   CLINICAL DATA:  Renal failure, history hypertension, diabetes  EXAM: RENAL/URINARY TRACT ULTRASOUND COMPLETE  COMPARISON:  None  FINDINGS: Right Kidney:  Length: 12.4 cm length. Cortical thickness up to 1.8 cm thick. Increased cortical echogenicity. Hypoechoic nodule medial aspect mid RIGHT kidney 2.1 x 2.2 x 2.1 cm containing scattered internal echoes question complicated cyst. No additional mass, hydronephrosis or shadowing calcification.  Left Kidney:  Length: 10.5 cm length. Cortical thickness up to 2.0 cm  thick. Increased cortical echogenicity. Minimally complicated cyst at mid LEFT kidney 2.7 x 3.3 x 3.7 cm containing slightly irregular wall and scattered internal echoes. No additional mass, hydronephrosis or shadowing calcification.  Bladder:  Contains only minimal urine. Minimally enlarged central lobe of prostate.  IMPRESSION: Increased renal cortical echogenicity bilaterally consistent with medical renal disease.  Mildly complicated cysts within the mid portions of both kidneys.  No evidence of hydronephrosis.   Electronically Signed   By: Lavonia Dana M.D.   On: 01/14/2014 02:31   Dg Chest Port 1 View  01/14/2014   CLINICAL DATA:  Hemodialysis catheter placement  EXAM: Portable exam 0225 hr compared to 01/13/2014  COMPARISON:  None.  FINDINGS: LEFT jugular dual-lumen central venous catheter with tip projecting over mid SVC.  Enlargement of cardiac silhouette with pulmonary vascular congestion.  Perihilar and basilar infiltrates which could represent edema or infection.  No pneumothorax.  Bones unremarkable.  IMPRESSION: No pneumothorax following LEFT jugular line placement.  Enlargement of cardiac silhouette with pulmonary vascular congestion and diffuse increased pulmonary infiltrates, question edema versus infection.   Electronically Signed   By: Lavonia Dana M.D.   On: 01/14/2014 02:41   Dg Chest Port 1 View  01/13/2014   CLINICAL DATA:  73 year old male with back and knee pain. Shortness of Breath. Initial encounter.  EXAM: PORTABLE CHEST - 1 VIEW  COMPARISON:  11/21/2007.  FINDINGS: Portable AP supine view at 2211  hrs. New confluent bibasilar pulmonary opacity. No pneumothorax. Pulmonary vascularity within normal limits. Stable cardiac size and mediastinal contours. Visualized tracheal air column is within normal limits.  IMPRESSION: New confluent bibasilar pulmonary opacity, nonspecific but consider bilateral pneumonia or aspiration.   Electronically Signed   By: Lars Pinks M.D.   On: 01/13/2014 23:02     I have reviewed the patient's current medications.  Assessment/Plan: 1 AKI/CKD kidneys scarred. Suspect at least some chronic component . Serologies P, but suspect is hemodynamic and chronic.  K ^ , need to ^ clearance, also of ^ bicarb and lower solute 2 VDRF CHF , ? Other 3 Anemia 4? pneu on AB P ^ RF, ^ DFR, net neg, AB, entub, await serologies    LOS: 1 day   Trinia Georgi L Rondall Radigan 01/14/2014,8:10 AM

## 2014-01-14 NOTE — Progress Notes (Signed)
  Echocardiogram 2D Echocardiogram has been performed.  Basilia Jumbo 01/14/2014, 12:53 PM

## 2014-01-15 ENCOUNTER — Inpatient Hospital Stay (HOSPITAL_COMMUNITY): Payer: Medicare Other

## 2014-01-15 DIAGNOSIS — J9589 Other postprocedural complications and disorders of respiratory system, not elsewhere classified: Secondary | ICD-10-CM

## 2014-01-15 LAB — CBC
HCT: 25.8 % — ABNORMAL LOW (ref 39.0–52.0)
HCT: 25.9 % — ABNORMAL LOW (ref 39.0–52.0)
HEMOGLOBIN: 8.7 g/dL — AB (ref 13.0–17.0)
Hemoglobin: 8.8 g/dL — ABNORMAL LOW (ref 13.0–17.0)
MCH: 28.5 pg (ref 26.0–34.0)
MCH: 28.2 pg (ref 26.0–34.0)
MCHC: 34.1 g/dL (ref 30.0–36.0)
MCHC: 33.6 g/dL (ref 30.0–36.0)
MCV: 83.5 fL (ref 78.0–100.0)
MCV: 84.1 fL (ref 78.0–100.0)
Platelets: 46 10*3/uL — ABNORMAL LOW (ref 150–400)
Platelets: 49 10*3/uL — ABNORMAL LOW (ref 150–400)
RBC: 3.09 MIL/uL — ABNORMAL LOW (ref 4.22–5.81)
RBC: 3.08 MIL/uL — AB (ref 4.22–5.81)
RDW: 17.6 % — AB (ref 11.5–15.5)
RDW: 17.7 % — ABNORMAL HIGH (ref 11.5–15.5)
WBC: 18.6 10*3/uL — AB (ref 4.0–10.5)
WBC: 19.8 10*3/uL — AB (ref 4.0–10.5)

## 2014-01-15 LAB — RENAL FUNCTION PANEL
Albumin: 2.6 g/dL — ABNORMAL LOW (ref 3.5–5.2)
Albumin: 2.7 g/dL — ABNORMAL LOW (ref 3.5–5.2)
BUN: 50 mg/dL — AB (ref 6–23)
BUN: 38 mg/dL — ABNORMAL HIGH (ref 6–23)
CALCIUM: 7.7 mg/dL — AB (ref 8.4–10.5)
CHLORIDE: 97 meq/L (ref 96–112)
CO2: 25 meq/L (ref 19–32)
CO2: 25 mEq/L (ref 19–32)
Calcium: 8 mg/dL — ABNORMAL LOW (ref 8.4–10.5)
Chloride: 93 mEq/L — ABNORMAL LOW (ref 96–112)
Creatinine, Ser: 3.34 mg/dL — ABNORMAL HIGH (ref 0.50–1.35)
Creatinine, Ser: 2.76 mg/dL — ABNORMAL HIGH (ref 0.50–1.35)
GFR calc non Af Amer: 17 mL/min — ABNORMAL LOW (ref >90)
GFR, EST AFRICAN AMERICAN: 20 mL/min — AB (ref >90)
GFR, EST AFRICAN AMERICAN: 25 mL/min — AB (ref >90)
GFR, EST NON AFRICAN AMERICAN: 21 mL/min — AB (ref >90)
GLUCOSE: 107 mg/dL — AB (ref 70–99)
Glucose, Bld: 124 mg/dL — ABNORMAL HIGH (ref 70–99)
PHOSPHORUS: 4.4 mg/dL (ref 2.3–4.6)
Phosphorus: 3.9 mg/dL (ref 2.3–4.6)
Potassium: 3.9 mEq/L (ref 3.7–5.3)
Potassium: 4.5 mEq/L (ref 3.7–5.3)
SODIUM: 137 meq/L (ref 137–147)
Sodium: 135 mEq/L — ABNORMAL LOW (ref 137–147)

## 2014-01-15 LAB — BLOOD GAS, ARTERIAL
ACID-BASE EXCESS: 1.1 mmol/L (ref 0.0–2.0)
Bicarbonate: 26.5 mEq/L — ABNORMAL HIGH (ref 20.0–24.0)
DRAWN BY: 331761
FIO2: 0.9 %
LHR: 30 {breaths}/min
O2 SAT: 99.7 %
PATIENT TEMPERATURE: 97.8
PCO2 ART: 51 mmHg — AB (ref 35.0–45.0)
PEEP: 10 cmH2O
TCO2: 28.1 mmol/L (ref 0–100)
VT: 550 mL
pH, Arterial: 7.332 — ABNORMAL LOW (ref 7.350–7.450)
pO2, Arterial: 115 mmHg — ABNORMAL HIGH (ref 80.0–100.0)

## 2014-01-15 LAB — ANCA SCREEN W REFLEX TITER
Atypical p-ANCA Screen: NEGATIVE
P-ANCA SCREEN: NEGATIVE
c-ANCA Screen: NEGATIVE

## 2014-01-15 LAB — GLUCOSE, CAPILLARY
GLUCOSE-CAPILLARY: 127 mg/dL — AB (ref 70–99)
GLUCOSE-CAPILLARY: 111 mg/dL — AB (ref 70–99)
GLUCOSE-CAPILLARY: 130 mg/dL — AB (ref 70–99)
GLUCOSE-CAPILLARY: 146 mg/dL — AB (ref 70–99)
Glucose-Capillary: 208 mg/dL — ABNORMAL HIGH (ref 70–99)
Glucose-Capillary: 84 mg/dL (ref 70–99)

## 2014-01-15 LAB — COMPREHENSIVE METABOLIC PANEL
ALBUMIN: 2.6 g/dL — AB (ref 3.5–5.2)
ALK PHOS: 59 U/L (ref 39–117)
ALT: 19 U/L (ref 0–53)
AST: 38 U/L — ABNORMAL HIGH (ref 0–37)
BUN: 49 mg/dL — AB (ref 6–23)
CALCIUM: 7.8 mg/dL — AB (ref 8.4–10.5)
CO2: 25 mEq/L (ref 19–32)
Chloride: 94 mEq/L — ABNORMAL LOW (ref 96–112)
Creatinine, Ser: 3.39 mg/dL — ABNORMAL HIGH (ref 0.50–1.35)
GFR calc non Af Amer: 17 mL/min — ABNORMAL LOW (ref >90)
GFR, EST AFRICAN AMERICAN: 19 mL/min — AB (ref >90)
Glucose, Bld: 123 mg/dL — ABNORMAL HIGH (ref 70–99)
POTASSIUM: 4 meq/L (ref 3.7–5.3)
SODIUM: 137 meq/L (ref 137–147)
TOTAL PROTEIN: 6 g/dL (ref 6.0–8.3)
Total Bilirubin: 0.8 mg/dL (ref 0.3–1.2)

## 2014-01-15 LAB — URINE CULTURE
COLONY COUNT: NO GROWTH
CULTURE: NO GROWTH

## 2014-01-15 LAB — C3 COMPLEMENT: C3 COMPLEMENT: 81 mg/dL — AB (ref 90–180)

## 2014-01-15 LAB — PHOSPHORUS: Phosphorus: 4.5 mg/dL (ref 2.3–4.6)

## 2014-01-15 LAB — MAGNESIUM: Magnesium: 2.3 mg/dL (ref 1.5–2.5)

## 2014-01-15 LAB — C4 COMPLEMENT: Complement C4, Body Fluid: 32 mg/dL (ref 10–40)

## 2014-01-15 MED ORDER — OXEPA PO LIQD
1000.0000 mL | ORAL | Status: DC
  Administered 2014-01-15: 1000 mL
  Filled 2014-01-15 (×2): qty 1000

## 2014-01-15 MED ORDER — ASPIRIN 81 MG PO CHEW
81.0000 mg | CHEWABLE_TABLET | Freq: Every day | ORAL | Status: DC
  Administered 2014-01-15 – 2014-02-11 (×22): 81 mg via ORAL
  Filled 2014-01-15 (×21): qty 1

## 2014-01-15 MED ORDER — OXEPA PO LIQD
1000.0000 mL | ORAL | Status: DC
  Administered 2014-01-17: 1000 mL
  Filled 2014-01-15 (×4): qty 1000

## 2014-01-15 MED ORDER — PRO-STAT SUGAR FREE PO LIQD
60.0000 mL | Freq: Four times a day (QID) | ORAL | Status: DC
  Administered 2014-01-15: 19:00:00
  Administered 2014-01-15 – 2014-01-16 (×2): 60 mL
  Administered 2014-01-16: 10:00:00
  Administered 2014-01-16 – 2014-01-18 (×7): 60 mL
  Filled 2014-01-15 (×14): qty 60

## 2014-01-15 MED ORDER — PIPERACILLIN-TAZOBACTAM 3.375 G IVPB 30 MIN
3.3750 g | Freq: Four times a day (QID) | INTRAVENOUS | Status: DC
  Administered 2014-01-15 – 2014-01-19 (×18): 3.375 g via INTRAVENOUS
  Filled 2014-01-15 (×20): qty 50

## 2014-01-15 MED ORDER — PRISMASOL BGK 4/2.5 32-4-2.5 MEQ/L IV SOLN
INTRAVENOUS | Status: DC
  Administered 2014-01-15 – 2014-01-19 (×16): via INTRAVENOUS_CENTRAL
  Filled 2014-01-15 (×20): qty 5000

## 2014-01-15 MED ORDER — PRO-STAT SUGAR FREE PO LIQD: 60.0000 mL | Freq: Four times a day (QID) | ORAL | Status: DC

## 2014-01-15 MED ORDER — PRISMASOL BGK 4/2.5 32-4-2.5 MEQ/L IV SOLN
INTRAVENOUS | Status: DC
  Administered 2014-01-15 – 2014-01-19 (×16): via INTRAVENOUS_CENTRAL
  Filled 2014-01-15 (×20): qty 5000

## 2014-01-15 MED ORDER — PRISMASOL BGK 4/2.5 32-4-2.5 MEQ/L IV SOLN
INTRAVENOUS | Status: DC
  Administered 2014-01-15 – 2014-01-19 (×31): via INTRAVENOUS_CENTRAL
  Filled 2014-01-15 (×60): qty 5000

## 2014-01-15 NOTE — Progress Notes (Signed)
PULMONARY / CRITICAL CARE MEDICINE   Name: [name] MRN: [id number] DOB: 1941/05/18    ADMISSION DATE:  01/13/2014 CONSULTATION DATE:  01/13/2014  REFERRING MD :  EDP PRIMARY SERVICE: PCCM  CHIEF COMPLAINT:  Back pain  BRIEF PATIENT DESCRIPTION: 73 year old male with HTN and DM presented 5/27 to ED c/o N/V since 5/25. In ED found to be in acute renal failure, hyperkalemic, hypotensive with SBP's in 70s. Started on BiPAP for hypoxia and increased WOB. PCCM asked to admit.   SIGNIFICANT EVENTS / STUDIES:  2/27 admitted 2/28 intubated, shock, pressors, CVVH  LINES / TUBES: Foley 5/27 >>> PIV  CULTURES: Blood 5/27 >>> Urine 5/27 >>> Sputum 5/27 >>>  ANTIBIOTICS: Zosyn 5/27 >>> Vancomycin 5/27 >>>   SUBJECTIVE:sedated on vent Afebrile on CVVH Off epi gtt, lower on levo gtt Anuric   VITAL SIGNS: Temp:  [97 F (36.1 C)-98.2 F (36.8 C)] 98.2 F (36.8 C) (05/29 1600) Pulse Rate:  [53-111] 78 (05/29 1600) Resp:  [12-35] 19 (05/29 1600) BP: (80-128)/(29-82) 99/48 mmHg (05/29 1600) SpO2:  [93 %-100 %] 98 % (05/29 1600) Arterial Line BP: (93-145)/(38-49) 135/48 mmHg (05/29 1600) FiO2 (%):  [60 %-100 %] 60 % (05/29 1600) Weight:  [92.7 kg (204 lb 5.9 oz)] 92.7 kg (204 lb 5.9 oz) (05/29 0500) HEMODYNAMICS: CVP:  [6 mmHg-7 mmHg] 7 mmHg VENTILATOR SETTINGS: Vent Mode:  [-] PRVC FiO2 (%):  [60 %-100 %] 60 % Set Rate:  [30 bmp] 30 bmp Vt Set:  [550 mL] 550 mL PEEP:  [10 cmH20-16 cmH20] 10 cmH20 Plateau Pressure:  [14 cmH20-32 cmH20] 14 cmH20 INTAKE / OUTPUT: Intake/Output     05/28 0701 - 05/29 0700 05/29 0701 - 05/30 0700   I.V. (mL/kg) 3439.4 (37.1) 486.4 (5.2)   NG/GT  56.7   IV Piggyback 162.5 275   Total Intake(mL/kg) 3601.9 (38.9) 818 (8.8)   Other 3588 1387   Total Output 3588 1387   Net +13.9 -569          PHYSICAL EXAMINATION: General:  Elderly male , acutely ill, intubated, sedated Neuro:  Sedated, Non focal HEENT:  PERRL Cardiovascular:  tachy,  regular Lungs:  Scattered rales, synchronous with vent, peak pr 35 Abdomen:  Soft, non-tender, non-distended Musculoskeletal:  No acute deformity Skin:  Intact  LABS:  CBC  Recent Labs Lab 01/14/14 0428 01/15/14 0500 01/15/14 1522  WBC 19.5* 18.6* 19.8*  HGB 10.5* 8.8* 8.7*  HCT 31.1* 25.8* 25.9*  PLT 31* 46* 49*   Coag's No results found for this basename: APTT, INR,  in the last 168 hours BMET  Recent Labs Lab 01/15/14 0400 01/15/14 0500 01/15/14 1522  NA 135* 137 137  K 3.9 4.0 4.5  CL 93* 94* 97  CO2 25 25 25   BUN 50* 49* 38*  CREATININE 3.34* 3.39* 2.76*  GLUCOSE 124* 123* 107*   Electrolytes  Recent Labs Lab 01/14/14 0428  01/15/14 0400 01/15/14 0500 01/15/14 1522  CALCIUM 9.4  < > 7.7* 7.8* 8.0*  MG 1.8  --   --  2.3  --   PHOS 5.2*  < > 4.4 4.5 3.9  < > = values in this interval not displayed. Sepsis Markers  Recent Labs Lab 01/13/14 2137 01/14/14 0007  LATICACIDVEN 8.59* 4.9*  PROCALCITON  --  1.62   ABG  Recent Labs Lab 01/14/14 1446 01/14/14 1937 01/15/14 0453  PHART 7.139* 7.255* 7.332*  PCO2ART 48.2* 49.3* 51.0*  PO2ART 89.0 118.0* 115.0*   Liver  Enzymes  Recent Labs Lab 01/13/14 1949 01/13/14 2125  01/15/14 0400 01/15/14 0500 01/15/14 1522  AST 67* 53*  --   --  38*  --   ALT 21 19  --   --  19  --   ALKPHOS 85 81  --   --  59  --   BILITOT 1.1 0.9  --   --  0.8  --   ALBUMIN 3.6 3.5  < > 2.6* 2.6* 2.7*  < > = values in this interval not displayed. Cardiac Enzymes  Recent Labs Lab 01/13/14 2125 01/14/14 0428 01/14/14 1207  TROPONINI  --  1.44* 0.97*  PROBNP 35530.0*  --   --    Glucose  Recent Labs Lab 01/14/14 1923 01/15/14 0042 01/15/14 0351 01/15/14 0901 01/15/14 1117 01/15/14 1639  GLUCAP 188* 208* 127* 111* 130* 84    Imaging US Renal  01/14/2014   CLINICAL DATA:  Renal failure, history hypertension, diabetes  EXAM: RENAL/URINARY TRACT ULTRASOUND COMPLETE  COMPARISON:  None  FINDINGS: Right  Kidney:  Length: 12.4 cm length. Cortical thickness up to 1.8 cm thick. Increased cortical echogenicity. Hypoechoic nodule medial aspect mid RIGHT kidney 2.1 x 2.2 x 2.1 cm containing scattered internal echoes question complicated cyst. No additional mass, hydronephrosis or shadowing calcification.  Left Kidney:  Length: 10.5 cm length. Cortical thickness up to 2.0 cm thick. Increased cortical echogenicity. Minimally complicated cyst at mid LEFT kidney 2.7 x 3.3 x 3.7 cm containing slightly irregular wall and scattered internal echoes. No additional mass, hydronephrosis or shadowing calcification.  Bladder:  Contains only minimal urine. Minimally enlarged central lobe of prostate.  IMPRESSION: Increased renal cortical echogenicity bilaterally consistent with medical renal disease.  Mildly complicated cysts within the mid portions of both kidneys.  No evidence of hydronephrosis.   Electronically Signed   By: Lavonia Dana M.D.   On: 01/14/2014 02:31   Dg Chest Port 1 View  01/15/2014   CLINICAL DATA:  Ventilator support.  Pneumonia.  EXAM: PORTABLE CHEST - 1 VIEW  COMPARISON:  01/14/2014  FINDINGS: Endotracheal tube has its tip 4 cm above the carina. Nasogastric tube enters the stomach. Right internal jugular central line has its tip in the SVC above the right atrium. Left internal jugular central line tips are in the SVC above the right atrium. Bilateral lower lobe airspace filling consistent with pneumonia persists, probably slightly improved since yesterday. The upper lungs remain clear. No effusions.  IMPRESSION: Lines and tubes well positioned. Slight improvement in bilateral lower lobe pneumonia.   Electronically Signed   By: Nelson Chimes M.D.   On: 01/15/2014 07:35   Dg Chest Port 1 View  01/14/2014   CLINICAL DATA:  Status post central line placement  EXAM: PORTABLE CHEST - 1 VIEW  COMPARISON:  01/14/2014 225 hr  FINDINGS: A left jugular dual-lumen central line is again identified and stable. A new right  jugular central line is seen with the catheter tip at the cavoatrial junction. No pneumothorax is noted. The endotracheal tube is seen 5 cm above the carina. A nasogastric catheter is noted extending into the stomach. Bilateral lower lung infiltrates are seen. The overall appearance is stable. No new focal abnormality is noted.  IMPRESSION: Stable bilateral infiltrates.  New tubes and lines as described above without complicating factors.   Electronically Signed   By: Inez Catalina M.D.   On: 01/14/2014 09:33   Dg Chest Port 1 View  01/14/2014   CLINICAL DATA:  Hemodialysis  catheter placement  EXAM: Portable exam 0225 hr compared to 01/13/2014  COMPARISON:  None.  FINDINGS: LEFT jugular dual-lumen central venous catheter with tip projecting over mid SVC.  Enlargement of cardiac silhouette with pulmonary vascular congestion.  Perihilar and basilar infiltrates which could represent edema or infection.  No pneumothorax.  Bones unremarkable.  IMPRESSION: No pneumothorax following LEFT jugular line placement.  Enlargement of cardiac silhouette with pulmonary vascular congestion and diffuse increased pulmonary infiltrates, question edema versus infection.   Electronically Signed   By: Lavonia Dana M.D.   On: 01/14/2014 02:41   Dg Chest Port 1 View  01/13/2014   CLINICAL DATA:  73 year old male with back and knee pain. Shortness of Breath. Initial encounter.  EXAM: PORTABLE CHEST - 1 VIEW  COMPARISON:  11/21/2007.  FINDINGS: Portable AP supine view at 2211 hrs. New confluent bibasilar pulmonary opacity. No pneumothorax. Pulmonary vascularity within normal limits. Stable cardiac size and mediastinal contours. Visualized tracheal air column is within normal limits.  IMPRESSION: New confluent bibasilar pulmonary opacity, nonspecific but consider bilateral pneumonia or aspiration.   Electronically Signed   By: Lars Pinks M.D.   On: 01/13/2014 23:02    CXR: worsening BL infiltrates  ASSESSMENT /  PLAN:  PULMONARY A: Acute Hypoxemic Respiratory Failure  ARDS -CAP vs Aspiration PNA vs pulmonary edema  P:   ARDS ventilation - lung protective Tv  PEEP/FIO2 per ARDs protocol RR at 30 -goal pH > 7.25   CARDIOVASCULAR A:  Septic shock  NSTEMI - ?ECG changes, no comparison study, peak Tp 1.6 H/o HTN Echo  -nml LVEF, gr 2 diast dysfn, mod AS 1.4 P:  Lower pressors -levo & vaso gtt  RENAL A:   Acute renal failure - concern for NSAID induced nephropathy, also takes ACE-i Hyperkalemia > received insulin, calcium in ED High AG metabolic acidosis - on metformin at home Acute pulmonary edema /Fluid overload  P:   CRRT per nephrology Off bicarb gtt Monitor BMP  GASTROINTESTINAL A:   Nausea/Vomiting - currently no complaints  P:   NPO Start TFs -oxepa, PEP up - high risk ileus  HEMATOLOGIC A:   Anemia, appears acute Leukocytosis Thrombocytopenia likely in setting of acute illness VTE prophylaxis  P:  Follow CBC SCD's  INFECTIOUS A:   Septic shock possible etiology CAP vs Aspiration PNA.   P:   Abx and cultures as above Trend Fever curve and WBC  ENDOCRINE A:   DM without hyperglycemia - takes metformin at home.    P:   CBG monitoring and SSI  NEUROLOGIC A:  Chronic low back pain    P:   Hold NSAIDs fent gtt Versed gtt while BP low  Updated son in detail ,Prognosis slight improved from yesterday but remains guarded with 3 organ failure Summary -  73 yo male presents with hypotension, acute on ?chronic renal failure, metabolic acidosis, hyperkalemia, thrombocytopenia. Attributed to frequent NSAID use, and ACE inhibitor and metformin.  ARDS with CXR suggestive of aspiration pneumonitis.  Care during the described time interval was provided by me and/or other providers on the critical care team.  I have reviewed this patient's available data, including medical history, events of note, physical examination and test results as part of my  evaluation   The patient is critically ill with multiple organ systems failure and requires high complexity decision making for assessment and support, frequent evaluation and titration of therapies, application of advanced monitoring technologies and extensive interpretation of multiple databases. Critical Care Time  devoted to patient care services described in this note is 45 minutes.    Kara Mead MD. [place] Flood. [facility] Pager [phone] If no response call 319 [phone]

## 2014-01-15 NOTE — Progress Notes (Signed)
Subjective: Interval History: on vent , on 2 pressors , nonresponsive.  Objective: Vital signs in last 24 hours: Temp:  [97.4 F (36.3 C)-97.8 F (36.6 C)] 97.8 F (36.6 C) (05/29 0351) Pulse Rate:  [70-111] 70 (05/29 0600) Resp:  [12-38] 30 (05/29 0600) BP: (80-124)/(29-92) 88/51 mmHg (05/29 0600) SpO2:  [87 %-100 %] 100 % (05/29 0600) Arterial Line BP: (82-160)/(36-85) 99/45 mmHg (05/29 0600) FiO2 (%):  [90 %-100 %] 90 % (05/29 0355) Weight:  [92.7 kg (204 lb 5.9 oz)] 92.7 kg (204 lb 5.9 oz) (05/29 0500) Weight change: 5.609 kg (12 lb 5.9 oz)  Intake/Output from previous day: 05/28 0701 - 05/29 0700 In: 3453.3 [I.V.:3303.3; IV Piggyback:150] Out: 3588  Intake/Output this shift:    General appearance: nonresponsive on vent Resp: rhonchi bilaterally Cardio: S1, S2 normal and systolic murmur: holosystolic 2/6, blowing at apex GI: mild distension, no bs Extremities: edema 1+, and cool  Lab Results:  Recent Labs  01/14/14 0428 01/15/14 0500  WBC 19.5* 18.6*  HGB 10.5* 8.8*  HCT 31.1* 25.8*  PLT 31* 46*   BMET:  Recent Labs  01/15/14 0400 01/15/14 0500  NA 135* 137  K 3.9 4.0  CL 93* 94*  CO2 25 25  GLUCOSE 124* 123*  BUN 50* 49*  CREATININE 3.34* 3.39*  CALCIUM 7.7* 7.8*   No results found for this basename: PTH,  in the last 72 hours Iron Studies: No results found for this basename: IRON, TIBC, TRANSFERRIN, FERRITIN,  in the last 72 hours  Studies/Results: US Renal  01/14/2014   CLINICAL DATA:  Renal failure, history hypertension, diabetes  EXAM: RENAL/URINARY TRACT ULTRASOUND COMPLETE  COMPARISON:  None  FINDINGS: Right Kidney:  Length: 12.4 cm length. Cortical thickness up to 1.8 cm thick. Increased cortical echogenicity. Hypoechoic nodule medial aspect mid RIGHT kidney 2.1 x 2.2 x 2.1 cm containing scattered internal echoes question complicated cyst. No additional mass, hydronephrosis or shadowing calcification.  Left Kidney:  Length: 10.5 cm length.  Cortical thickness up to 2.0 cm thick. Increased cortical echogenicity. Minimally complicated cyst at mid LEFT kidney 2.7 x 3.3 x 3.7 cm containing slightly irregular wall and scattered internal echoes. No additional mass, hydronephrosis or shadowing calcification.  Bladder:  Contains only minimal urine. Minimally enlarged central lobe of prostate.  IMPRESSION: Increased renal cortical echogenicity bilaterally consistent with medical renal disease.  Mildly complicated cysts within the mid portions of both kidneys.  No evidence of hydronephrosis.   Electronically Signed   By: Lavonia Dana M.D.   On: 01/14/2014 02:31   Dg Chest Port 1 View  01/15/2014   CLINICAL DATA:  Ventilator support.  Pneumonia.  EXAM: PORTABLE CHEST - 1 VIEW  COMPARISON:  01/14/2014  FINDINGS: Endotracheal tube has its tip 4 cm above the carina. Nasogastric tube enters the stomach. Right internal jugular central line has its tip in the SVC above the right atrium. Left internal jugular central line tips are in the SVC above the right atrium. Bilateral lower lobe airspace filling consistent with pneumonia persists, probably slightly improved since yesterday. The upper lungs remain clear. No effusions.  IMPRESSION: Lines and tubes well positioned. Slight improvement in bilateral lower lobe pneumonia.   Electronically Signed   By: Nelson Chimes M.D.   On: 01/15/2014 07:35   Dg Chest Port 1 View  01/14/2014   CLINICAL DATA:  Status post central line placement  EXAM: PORTABLE CHEST - 1 VIEW  COMPARISON:  01/14/2014 225 hr  FINDINGS: A left jugular  dual-lumen central line is again identified and stable. A new right jugular central line is seen with the catheter tip at the cavoatrial junction. No pneumothorax is noted. The endotracheal tube is seen 5 cm above the carina. A nasogastric catheter is noted extending into the stomach. Bilateral lower lung infiltrates are seen. The overall appearance is stable. No new focal abnormality is noted.   IMPRESSION: Stable bilateral infiltrates.  New tubes and lines as described above without complicating factors.   Electronically Signed   By: Inez Catalina M.D.   On: 01/14/2014 09:33   Dg Chest Port 1 View  01/14/2014   CLINICAL DATA:  Hemodialysis catheter placement  EXAM: Portable exam 0225 hr compared to 01/13/2014  COMPARISON:  None.  FINDINGS: LEFT jugular dual-lumen central venous catheter with tip projecting over mid SVC.  Enlargement of cardiac silhouette with pulmonary vascular congestion.  Perihilar and basilar infiltrates which could represent edema or infection.  No pneumothorax.  Bones unremarkable.  IMPRESSION: No pneumothorax following LEFT jugular line placement.  Enlargement of cardiac silhouette with pulmonary vascular congestion and diffuse increased pulmonary infiltrates, question edema versus infection.   Electronically Signed   By: Lavonia Dana M.D.   On: 01/14/2014 02:41   Dg Chest Port 1 View  01/13/2014   CLINICAL DATA:  73 year old male with back and knee pain. Shortness of Breath. Initial encounter.  EXAM: PORTABLE CHEST - 1 VIEW  COMPARISON:  11/21/2007.  FINDINGS: Portable AP supine view at 2211 hrs. New confluent bibasilar pulmonary opacity. No pneumothorax. Pulmonary vascularity within normal limits. Stable cardiac size and mediastinal contours. Visualized tracheal air column is within normal limits.  IMPRESSION: New confluent bibasilar pulmonary opacity, nonspecific but consider bilateral pneumonia or aspiration.   Electronically Signed   By: Lars Pinks M.D.   On: 01/13/2014 23:02    I have reviewed the patient's current medications.  Assessment/Plan: 1 AKI/Ckd not known baseline.  Needs CRRT to maintain solute/K/acid base.  Crrt being effective now.  bp limiting vol off. Do not need bicarb gtt 2 Anemia low HB now, follow closely 3 schock presumeable infx but 2 D pending 4 low ptlt schock 5 VDRF 6 DM P CRRT, support bp , AB ,. F/u 2 D, follow hb, stop bicarb, use 4 k  RF and dialysate.     LOS: 2 days   Aaliah Jorgenson L Renelda Kilian 01/15/2014,7:47 AM

## 2014-01-15 NOTE — Progress Notes (Addendum)
INITIAL NUTRITION ASSESSMENT  DOCUMENTATION CODES Per approved criteria  -Not Applicable   INTERVENTION:  Utilize 61M PEPuP Protocol: initiate TF via OGT with Oxepa at 25 ml/h and Prostat 60 ml QID on day 1; on day 2, increase to goal rate of 30 ml/h (720 ml per day) to provide 1920 kcals, 113 gm protein, 848 ml free water daily.  NUTRITION DIAGNOSIS: Inadequate oral intake related to inability to eat as evidenced by NPO status.   Goal: Intake to meet >90% of estimated nutrition needs.  Monitor:  TF tolerance/adequacy, weight trend, labs, vent status.  Reason for Assessment: MD Consult for TF initiation and management.  73 y.o. male  Admitting Dx: Back Pain  ASSESSMENT: 73 year old male with HTN and DM presented 5/27 to ED c/o N/V since 5/25. In ED found to be in acute renal failure, hyperkalemic, hypotensive with SBP's in 70s. Started on BiPAP for hypoxia and increased WOB. Required intubation on 5/28.  Discussed patient in ICU rounds today. Currently receiving CRRT. ARDS pattern per CCM. Plans to start TF, Oxepa. Nutrition focused physical exam completed.  No muscle or subcutaneous fat depletion noticed.  Patient is currently intubated on ventilator support MV: 15.9 L/min Temp (24hrs), Avg:97.6 F (36.4 C), Min:97.4 F (36.3 C), Max:97.8 F (36.6 C)   Height: Ht Readings from Last 1 Encounters:  01/14/14 5\' 11"  (1.803 m)    Weight: Wt Readings from Last 1 Encounters:  01/15/14 204 lb 5.9 oz (92.7 kg)    Ideal Body Weight: 78.2 kg  % Ideal Body Weight: 119%  Wt Readings from Last 10 Encounters:  01/15/14 204 lb 5.9 oz (92.7 kg)    Usual Body Weight: unknown  % Usual Body Weight: NA  BMI:  Body mass index is 28.52 kg/(m^2).  Estimated Nutritional Needs: Kcal: 2030 Protein: 155 gm Fluid: 2-2.2 L  Skin: no issues  Diet Order: NPO  EDUCATION NEEDS: -Education not appropriate at this time   Intake/Output Summary (Last 24 hours) at 01/15/14  1023 Last data filed at 01/15/14 1000  Gross per 24 hour  Intake 4007.89 ml  Output   4076 ml  Net -68.11 ml    Last BM: None documented since admission   Labs:   Recent Labs Lab 01/13/14 2334  01/14/14 0428 01/14/14 1700 01/15/14 0400 01/15/14 0500  NA 138  --  139 141 135* 137  K 6.6*  --  6.4* 4.2 3.9 4.0  CL 105  --  97 94* 93* 94*  CO2  --   --  13* 20 25 25   BUN >140*  --  128* 78* 50* 49*  CREATININE 10.50*  --  8.20* 5.23* 3.34* 3.39*  CALCIUM  --   --  9.4 7.9* 7.7* 7.8*  MG  --   --  1.8  --   --  2.3  PHOS  --   < > 5.2* 6.5* 4.4 4.5  GLUCOSE 156*  --  126* 202* 124* 123*  < > = values in this interval not displayed.  CBG (last 3)   Recent Labs  01/15/14 0042 01/15/14 0351 01/15/14 0901  GLUCAP 208* 127* 111*    Scheduled Meds: . antiseptic oral rinse  15 mL Mouth Rinse QID  . aspirin  81 mg Oral Daily  . chlorhexidine  15 mL Mouth Rinse BID  . feeding supplement (OXEPA)  1,000 mL Per Tube Q24H  . fentaNYL  50 mcg Intravenous Once  . hydrocortisone sod succinate (SOLU-CORTEF) inj  50 mg Intravenous Q6H  . insulin aspart  2-6 Units Subcutaneous 6 times per day  . pantoprazole (PROTONIX) IV  40 mg Intravenous QHS  . piperacillin-tazobactam  3.375 g Intravenous 4 times per day  . vancomycin  1,000 mg Intravenous Q24H    Continuous Infusions: . sodium chloride    . fentaNYL infusion INTRAVENOUS 100 mcg/hr (01/15/14 1016)  . midazolam (VERSED) infusion 2 mg/hr (01/15/14 1017)  . norepinephrine (LEVOPHED) Adult infusion 15 mcg/min (01/15/14 0523)  . dialysis replacement fluid (prismasate) 800 mL/hr at 01/15/14 0910  . dialysis replacement fluid (prismasate) 800 mL/hr at 01/15/14 0913  . dialysate (PRISMASATE) 2,000 mL/hr at 01/15/14 0907  . vasopressin (PITRESSIN) infusion - *FOR SHOCK* 0.03 Units/min (01/14/14 1110)    Past Medical History  Diagnosis Date  . Diabetes mellitus without complication   . Hypertension   . Hypercholesteremia      Past Surgical History  Procedure Laterality Date  . Prostatectomy    . Colon resection      Molli Barrows, RD, LDN, [place] Pager [phone] After Hours Pager [phone]

## 2014-01-16 ENCOUNTER — Inpatient Hospital Stay (HOSPITAL_COMMUNITY): Payer: Medicare Other

## 2014-01-16 DIAGNOSIS — J189 Pneumonia, unspecified organism: Secondary | ICD-10-CM

## 2014-01-16 LAB — RENAL FUNCTION PANEL
ALBUMIN: 2.5 g/dL — AB (ref 3.5–5.2)
Albumin: 2.4 g/dL — ABNORMAL LOW (ref 3.5–5.2)
BUN: 36 mg/dL — ABNORMAL HIGH (ref 6–23)
BUN: 37 mg/dL — AB (ref 6–23)
CALCIUM: 7.8 mg/dL — AB (ref 8.4–10.5)
CHLORIDE: 99 meq/L (ref 96–112)
CHLORIDE: 100 meq/L (ref 96–112)
CO2: 24 mEq/L (ref 19–32)
CO2: 25 meq/L (ref 19–32)
CREATININE: 1.89 mg/dL — AB (ref 0.50–1.35)
Calcium: 7.7 mg/dL — ABNORMAL LOW (ref 8.4–10.5)
Creatinine, Ser: 2.22 mg/dL — ABNORMAL HIGH (ref 0.50–1.35)
GFR calc Af Amer: 39 mL/min — ABNORMAL LOW (ref >90)
GFR, EST AFRICAN AMERICAN: 32 mL/min — AB (ref >90)
GFR, EST NON AFRICAN AMERICAN: 28 mL/min — AB (ref >90)
GFR, EST NON AFRICAN AMERICAN: 34 mL/min — AB (ref >90)
GLUCOSE: 153 mg/dL — AB (ref 70–99)
Glucose, Bld: 135 mg/dL — ABNORMAL HIGH (ref 70–99)
Phosphorus: 3 mg/dL (ref 2.3–4.6)
Phosphorus: 1.8 mg/dL — ABNORMAL LOW (ref 2.3–4.6)
Potassium: 4.2 mEq/L (ref 3.7–5.3)
Potassium: 4 mEq/L (ref 3.7–5.3)
SODIUM: 138 meq/L (ref 137–147)
Sodium: 134 mEq/L — ABNORMAL LOW (ref 137–147)

## 2014-01-16 LAB — IRON AND TIBC
Iron: 72 ug/dL (ref 42–135)
SATURATION RATIOS: 33 % (ref 20–55)
TIBC: 220 ug/dL (ref 215–435)
UIBC: 148 ug/dL (ref 125–400)

## 2014-01-16 LAB — COMPREHENSIVE METABOLIC PANEL
ALBUMIN: 2.5 g/dL — AB (ref 3.5–5.2)
ALK PHOS: 67 U/L (ref 39–117)
ALT: 20 U/L (ref 0–53)
AST: 44 U/L — ABNORMAL HIGH (ref 0–37)
BUN: 36 mg/dL — ABNORMAL HIGH (ref 6–23)
CO2: 24 mEq/L (ref 19–32)
Calcium: 7.8 mg/dL — ABNORMAL LOW (ref 8.4–10.5)
Chloride: 98 mEq/L (ref 96–112)
Creatinine, Ser: 2.24 mg/dL — ABNORMAL HIGH (ref 0.50–1.35)
GFR calc non Af Amer: 28 mL/min — ABNORMAL LOW (ref >90)
GFR, EST AFRICAN AMERICAN: 32 mL/min — AB (ref >90)
GLUCOSE: 134 mg/dL — AB (ref 70–99)
POTASSIUM: 4.2 meq/L (ref 3.7–5.3)
Sodium: 137 mEq/L (ref 137–147)
TOTAL PROTEIN: 6.1 g/dL (ref 6.0–8.3)
Total Bilirubin: 0.8 mg/dL (ref 0.3–1.2)

## 2014-01-16 LAB — CBC
HCT: 23.4 % — ABNORMAL LOW (ref 39.0–52.0)
Hemoglobin: 8.1 g/dL — ABNORMAL LOW (ref 13.0–17.0)
MCH: 29.2 pg (ref 26.0–34.0)
MCHC: 34.6 g/dL (ref 30.0–36.0)
MCV: 84.5 fL (ref 78.0–100.0)
PLATELETS: 59 10*3/uL — AB (ref 150–400)
RBC: 2.77 MIL/uL — ABNORMAL LOW (ref 4.22–5.81)
RDW: 17.6 % — AB (ref 11.5–15.5)
WBC: 17.2 10*3/uL — AB (ref 4.0–10.5)

## 2014-01-16 LAB — PHOSPHORUS: Phosphorus: 3 mg/dL (ref 2.3–4.6)

## 2014-01-16 LAB — GLUCOSE, CAPILLARY
GLUCOSE-CAPILLARY: 133 mg/dL — AB (ref 70–99)
GLUCOSE-CAPILLARY: 143 mg/dL — AB (ref 70–99)
Glucose-Capillary: 160 mg/dL — ABNORMAL HIGH (ref 70–99)
Glucose-Capillary: 116 mg/dL — ABNORMAL HIGH (ref 70–99)
Glucose-Capillary: 120 mg/dL — ABNORMAL HIGH (ref 70–99)
Glucose-Capillary: 143 mg/dL — ABNORMAL HIGH (ref 70–99)

## 2014-01-16 LAB — POCT I-STAT 3, ART BLOOD GAS (G3+)
Acid-base deficit: 2 mmol/L (ref 0.0–2.0)
Bicarbonate: 22.8 mEq/L (ref 20.0–24.0)
O2 Saturation: 88 %
PH ART: 7.409 (ref 7.350–7.450)
PO2 ART: 53 mmHg — AB (ref 80.0–100.0)
Patient temperature: 97.6
TCO2: 24 mmol/L (ref 0–100)
pCO2 arterial: 35.9 mmHg (ref 35.0–45.0)

## 2014-01-16 LAB — CULTURE, RESPIRATORY W GRAM STAIN: Culture: NO GROWTH

## 2014-01-16 LAB — MAGNESIUM: Magnesium: 2.5 mg/dL (ref 1.5–2.5)

## 2014-01-16 LAB — CULTURE, RESPIRATORY

## 2014-01-16 NOTE — Progress Notes (Signed)
Subjective: Interval History: on vent, bps stable, on 2 pressors but good bp.  Objective: Vital signs in last 24 hours: Temp:  [97 F (36.1 C)-99.1 F (37.3 C)] 99.1 F (37.3 C) (05/30 0350) Pulse Rate:  [53-79] 74 (05/30 0700) Resp:  [12-35] 27 (05/30 0700) BP: (81-170)/(43-80) 115/61 mmHg (05/30 0700) SpO2:  [93 %-100 %] 95 % (05/30 0700) Arterial Line BP: (101-180)/(38-54) 135/50 mmHg (05/30 0700) FiO2 (%):  [40 %-60 %] 40 % (05/30 0302) Weight:  [91.6 kg (201 lb 15.1 oz)] 91.6 kg (201 lb 15.1 oz) (05/30 0223) Weight change: -1.1 kg (-2 lb 6.8 oz)  Intake/Output from previous day: 05/29 0701 - 05/30 0700 In: 2274.3 [I.V.:1507.7; NG/GT:441.7; IV Piggyback:325] Out: 2601  Intake/Output this shift:    General appearance: pale and sedated and nonresponsive Neck: IJ dialysis cath Resp: rales bibasilar Cardio: S1, S2 normal and systolic murmur: holosystolic 2/6, blowing at apex GI: mild distension, pos bs, liver down 4 cm Extremities: edema 1+  Lab Results:  Recent Labs  01/15/14 1522 01/16/14 0242  WBC 19.8* 17.2*  HGB 8.7* 8.1*  HCT 25.9* 23.4*  PLT 49* 59*   BMET:  Recent Labs  01/15/14 1522 01/16/14 0242  NA 137 138  137  K 4.5 4.2  4.2  CL 97 99  98  CO2 25 24  24   GLUCOSE 107* 135*  134*  BUN 38* 36*  36*  CREATININE 2.76* 2.22*  2.24*  CALCIUM 8.0* 7.7*  7.8*   No results found for this basename: PTH,  in the last 72 hours Iron Studies: No results found for this basename: IRON, TIBC, TRANSFERRIN, FERRITIN,  in the last 72 hours  Studies/Results: Dg Chest Port 1 View  01/16/2014   CLINICAL DATA:  Aspiration pneumonia  EXAM: PORTABLE CHEST - 1 VIEW  COMPARISON:  01/15/2014  FINDINGS: Endotracheal tube ends in the mid thoracic trachea. Unchanged positioning of bilateral IJ catheters, tips at the level of the SVC. Enteric tube enters the stomach.  Bibasilar airspace disease shows no progression or clearing. No evidence of increasing pleural fluid  or air leak.  IMPRESSION: 1. Stable positioning of tubes and lines. 2. Unchanged bibasilar pneumonia.   Electronically Signed   By: Jorje Guild M.D.   On: 01/16/2014 07:18   Dg Chest Port 1 View  01/15/2014   CLINICAL DATA:  Ventilator support.  Pneumonia.  EXAM: PORTABLE CHEST - 1 VIEW  COMPARISON:  01/14/2014  FINDINGS: Endotracheal tube has its tip 4 cm above the carina. Nasogastric tube enters the stomach. Right internal jugular central line has its tip in the SVC above the right atrium. Left internal jugular central line tips are in the SVC above the right atrium. Bilateral lower lobe airspace filling consistent with pneumonia persists, probably slightly improved since yesterday. The upper lungs remain clear. No effusions.  IMPRESSION: Lines and tubes well positioned. Slight improvement in bilateral lower lobe pneumonia.   Electronically Signed   By: Nelson Chimes M.D.   On: 01/15/2014 07:35   Dg Chest Port 1 View  01/14/2014   CLINICAL DATA:  Status post central line placement  EXAM: PORTABLE CHEST - 1 VIEW  COMPARISON:  01/14/2014 225 hr  FINDINGS: A left jugular dual-lumen central line is again identified and stable. A new right jugular central line is seen with the catheter tip at the cavoatrial junction. No pneumothorax is noted. The endotracheal tube is seen 5 cm above the carina. A nasogastric catheter is noted extending into the  stomach. Bilateral lower lung infiltrates are seen. The overall appearance is stable. No new focal abnormality is noted.  IMPRESSION: Stable bilateral infiltrates.  New tubes and lines as described above without complicating factors.   Electronically Signed   By: Inez Catalina M.D.   On: 01/14/2014 09:33    I have reviewed the patient's current medications.  Assessment/Plan: 1 AKI/CKD not clear baseline, Risks ACEI, NSAIDS, metformen, schock.  Oliguric. Good clearance of solute, acid/base/K ok. XS vol on CXR and exam and can take off some with BP ok. 2 Anemia  check Fe 3 HPTH check 4 DM controlled 5 Sepsis on BSAB 6 VDRF per CCM.  Try to lower vol to assist.  Suspect pneu 7 Low Ptlt suspect sepsis P CRRT, check Fe, cont AB, vent,     LOS: 3 days   [name] L [name] 01/16/2014,8:07 AM

## 2014-01-16 NOTE — Progress Notes (Signed)
PULMONARY / CRITICAL CARE MEDICINE   Name: [name] MRN: [id number] DOB: 02-18-1941    ADMISSION DATE:  01/13/2014 CONSULTATION DATE:  01/13/2014  REFERRING MD :  EDP PRIMARY SERVICE: PCCM  CHIEF COMPLAINT:  Back pain  BRIEF PATIENT DESCRIPTION: 73 year old male with HTN and DM presented 5/27 to ED c/o N/V since 5/25. In ED found to be in acute renal failure, hyperkalemic, hypotensive with SBP's in 70s. Started on BiPAP for hypoxia and increased WOB. PCCM asked to admit.   SIGNIFICANT EVENTS / STUDIES:  5/27 admitted 5/28 intubated, shock, pressors, CVVH 5/30 Start pressure support trials  LINES / TUBES: ETT 5/28 >>  Rt radial aline 5/28 >> Rt IJ CVL 5/28 >>  Lt IJ HD catheter 5/28 >>   CULTURES: Blood 5/27 >>> Urine 5/27 >>> neg Sputum 5/27 >>>  ANTIBIOTICS: Zosyn 5/27 >>> Vancomycin 5/27 >>>  SUBJECTIVE: 800 ml Vt with pressure support 4 over peep 8.  VITAL SIGNS: Temp:  [97 F (36.1 C)-99.1 F (37.3 C)] 98.1 F (36.7 C) (05/30 0700) Pulse Rate:  [53-79] 78 (05/30 0849) Resp:  [12-35] 25 (05/30 0849) BP: (94-170)/(43-72) 115/61 mmHg (05/30 0700) SpO2:  [93 %-100 %] 94 % (05/30 0849) Arterial Line BP: (111-180)/(40-54) 135/50 mmHg (05/30 0700) FiO2 (%):  [40 %-60 %] 40 % (05/30 1121) Weight:  [91.6 kg (201 lb 15.1 oz)] 91.6 kg (201 lb 15.1 oz) (05/30 0223) HEMODYNAMICS: CVP:  [7 mmHg] 7 mmHg VENTILATOR SETTINGS: Vent Mode:  [-] PRVC FiO2 (%):  [40 %-60 %] 40 % Set Rate:  [30 bmp] 30 bmp Vt Set:  [550 mL] 550 mL PEEP:  [10 cmH20] 10 cmH20 Plateau Pressure:  [14 cmH20-27 cmH20] 19 cmH20 INTAKE / OUTPUT: Intake/Output     05/29 0701 - 05/30 0700 05/30 0701 - 05/31 0700   I.V. (mL/kg) 1507.7 (16.5)    NG/GT 441.7    IV Piggyback 325    Total Intake(mL/kg) 2274.3 (24.8)    Other 2601 528   Total Output 2601 528   Net -326.7 -528          PHYSICAL EXAMINATION: General:  Elderly male , acutely ill, intubated, sedated Neuro:  Sedated, Non  focal HEENT:  PERRL Cardiovascular:  tachy, regular Lungs:  Scattered rales/rhonchi Abdomen:  Soft, non-tender, non-distended Musculoskeletal:  No acute deformity Skin:  Intact  LABS:  CBC  Recent Labs Lab 01/15/14 0500 01/15/14 1522 01/16/14 0242  WBC 18.6* 19.8* 17.2*  HGB 8.8* 8.7* 8.1*  HCT 25.8* 25.9* 23.4*  PLT 46* 49* 59*   BMET  Recent Labs Lab 01/15/14 0500 01/15/14 1522 01/16/14 0242  NA 137 137 138  137  K 4.0 4.5 4.2  4.2  CL 94* 97 99  98  CO2 25 25 24  24   BUN 49* 38* 36*  36*  CREATININE 3.39* 2.76* 2.22*  2.24*  GLUCOSE 123* 107* 135*  134*   Electrolytes  Recent Labs Lab 01/14/14 0428  01/15/14 0500 01/15/14 1522 01/16/14 0242  CALCIUM 9.4  < > 7.8* 8.0* 7.7*  7.8*  MG 1.8  --  2.3  --  2.5  PHOS 5.2*  < > 4.5 3.9 3.0  3.0  < > = values in this interval not displayed.  Sepsis Markers  Recent Labs Lab 01/13/14 2137 01/14/14 0007  LATICACIDVEN 8.59* 4.9*  PROCALCITON  --  1.62   ABG  Recent Labs Lab 01/14/14 1446 01/14/14 1937 01/15/14 0453  PHART 7.139* 7.255* 7.332*  PCO2ART  48.2* 49.3* 51.0*  PO2ART 89.0 118.0* 115.0*   Liver Enzymes  Recent Labs Lab 01/13/14 2125  01/15/14 0500 01/15/14 1522 01/16/14 0242  AST 53*  --  38*  --  44*  ALT 19  --  19  --  20  ALKPHOS 81  --  59  --  67  BILITOT 0.9  --  0.8  --  0.8  ALBUMIN 3.5  < > 2.6* 2.7* 2.5*  2.5*  < > = values in this interval not displayed.  Cardiac Enzymes  Recent Labs Lab 01/13/14 2125 01/14/14 0428 01/14/14 1207  TROPONINI  --  1.44* 0.97*  PROBNP 35530.0*  --   --    Glucose  Recent Labs Lab 01/15/14 1117 01/15/14 1639 01/15/14 2004 01/16/14 0054 01/16/14 0349 01/16/14 0734  GLUCAP 130* 84 146* 160* 116* 120*    Imaging Dg Chest Port 1 View  01/16/2014   CLINICAL DATA:  Aspiration pneumonia  EXAM: PORTABLE CHEST - 1 VIEW  COMPARISON:  01/15/2014  FINDINGS: Endotracheal tube ends in the mid thoracic trachea. Unchanged  positioning of bilateral IJ catheters, tips at the level of the SVC. Enteric tube enters the stomach.  Bibasilar airspace disease shows no progression or clearing. No evidence of increasing pleural fluid or air leak.  IMPRESSION: 1. Stable positioning of tubes and lines. 2. Unchanged bibasilar pneumonia.   Electronically Signed   By: Jorje Guild M.D.   On: 01/16/2014 07:18   Dg Chest Port 1 View  01/15/2014   CLINICAL DATA:  Ventilator support.  Pneumonia.  EXAM: PORTABLE CHEST - 1 VIEW  COMPARISON:  01/14/2014  FINDINGS: Endotracheal tube has its tip 4 cm above the carina. Nasogastric tube enters the stomach. Right internal jugular central line has its tip in the SVC above the right atrium. Left internal jugular central line tips are in the SVC above the right atrium. Bilateral lower lobe airspace filling consistent with pneumonia persists, probably slightly improved since yesterday. The upper lungs remain clear. No effusions.  IMPRESSION: Lines and tubes well positioned. Slight improvement in bilateral lower lobe pneumonia.   Electronically Signed   By: Nelson Chimes M.D.   On: 01/15/2014 07:35   CXR: stable to slightly improved bilateral infiltrates.   ASSESSMENT / PLAN:  PULMONARY A: Acute Hypoxemic Respiratory Failure  ARDS -CAP vs Aspiration PNA vs pulmonary edema P:   Start to cycle on pressure support as tolerated Liberalize Vt to 8 cc/kg >> improved resp mechanics with volume removal F/u abg am F/u pcxr am   CARDIOVASCULAR A:  Septic shock  NSTEMI - ?ECG changes, no comparison study, peak Tp 1.6 H/o HTN Echo  -nml LVEF, gr 2 diast dysfn, mod AS 1.4 P:  Wean levo Allow neg volume if able Continue tele  Stress dose steroids   RENAL A:   Acute renal failure - concern for NSAID induced nephropathy, also takes ACE-i Hyperkalemia > received insulin, calcium in ED High AG metabolic acidosis - on metformin at home Acute pulmonary edema /Fluid overload P:   CRRT per  nephrology Monitor BMP  GASTROINTESTINAL A:   Nausea/Vomiting - currently no complaints. Nutrition. P:   Start TFs -oxepa, PEP up - high risk ileus  HEMATOLOGIC A:   Anemia, appears acute Leukocytosis Thrombocytopenia likely in setting of acute illness VTE prophylaxis P:  Follow CBC SCD's  INFECTIOUS A:   Septic shock  possible etiology CAP vs Aspiration PNA.  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  P:   CBG monitoring and SSI  NEUROLOGIC A:  Chronic low back pain   P:   Hold NSAIDs fent gtt D/c versed.  RASS goal -2  Reviewed above, examined pt, and agree.  Remains on pressors, but needs improved.  Remains on CRRT.  Respiratory mechanics and oxygenation improving >> will cycle on pressure support as tolerated, but not ready for extubation yet.  Continue current Abx.  Updated family at bedside.  CC time 35 minutes.  [name], MD [facility] Pulmonary/Critical Care 01/16/2014, 12:59 PM Pager:  [phone] After 3pm call: [phone]

## 2014-01-17 LAB — RENAL FUNCTION PANEL
ALBUMIN: 2.4 g/dL — AB (ref 3.5–5.2)
Albumin: 2.5 g/dL — ABNORMAL LOW (ref 3.5–5.2)
BUN: 36 mg/dL — ABNORMAL HIGH (ref 6–23)
BUN: 36 mg/dL — ABNORMAL HIGH (ref 6–23)
CALCIUM: 7.8 mg/dL — AB (ref 8.4–10.5)
CO2: 24 mEq/L (ref 19–32)
CO2: 24 mEq/L (ref 19–32)
CREATININE: 1.66 mg/dL — AB (ref 0.50–1.35)
Calcium: 7.9 mg/dL — ABNORMAL LOW (ref 8.4–10.5)
Chloride: 100 mEq/L (ref 96–112)
Chloride: 100 mEq/L (ref 96–112)
Creatinine, Ser: 1.62 mg/dL — ABNORMAL HIGH (ref 0.50–1.35)
GFR calc Af Amer: 46 mL/min — ABNORMAL LOW (ref >90)
GFR calc non Af Amer: 40 mL/min — ABNORMAL LOW (ref >90)
GFR, EST AFRICAN AMERICAN: 47 mL/min — AB (ref >90)
GFR, EST NON AFRICAN AMERICAN: 41 mL/min — AB (ref >90)
Glucose, Bld: 142 mg/dL — ABNORMAL HIGH (ref 70–99)
Glucose, Bld: 142 mg/dL — ABNORMAL HIGH (ref 70–99)
PHOSPHORUS: 1.4 mg/dL — AB (ref 2.3–4.6)
PHOSPHORUS: 1.6 mg/dL — AB (ref 2.3–4.6)
POTASSIUM: 3.8 meq/L (ref 3.7–5.3)
Potassium: 3.4 mEq/L — ABNORMAL LOW (ref 3.7–5.3)
Sodium: 136 mEq/L — ABNORMAL LOW (ref 137–147)
Sodium: 137 mEq/L (ref 137–147)

## 2014-01-17 LAB — POCT I-STAT 3, ART BLOOD GAS (G3+)
Acid-Base Excess: 2 mmol/L (ref 0.0–2.0)
Bicarbonate: 26.3 mEq/L — ABNORMAL HIGH (ref 20.0–24.0)
O2 Saturation: 96 %
PH ART: 7.411 (ref 7.350–7.450)
TCO2: 28 mmol/L (ref 0–100)
pCO2 arterial: 41.5 mmHg (ref 35.0–45.0)
pO2, Arterial: 83 mmHg (ref 80.0–100.0)

## 2014-01-17 LAB — GLUCOSE, CAPILLARY
Glucose-Capillary: 113 mg/dL — ABNORMAL HIGH (ref 70–99)
Glucose-Capillary: 120 mg/dL — ABNORMAL HIGH (ref 70–99)
Glucose-Capillary: 121 mg/dL — ABNORMAL HIGH (ref 70–99)
Glucose-Capillary: 132 mg/dL — ABNORMAL HIGH (ref 70–99)
Glucose-Capillary: 146 mg/dL — ABNORMAL HIGH (ref 70–99)
Glucose-Capillary: 187 mg/dL — ABNORMAL HIGH (ref 70–99)

## 2014-01-17 LAB — CBC
HCT: 22.5 % — ABNORMAL LOW (ref 39.0–52.0)
Hemoglobin: 7.7 g/dL — ABNORMAL LOW (ref 13.0–17.0)
MCH: 29.5 pg (ref 26.0–34.0)
MCHC: 34.2 g/dL (ref 30.0–36.0)
MCV: 86.2 fL (ref 78.0–100.0)
Platelets: 67 10*3/uL — ABNORMAL LOW (ref 150–400)
RBC: 2.61 MIL/uL — ABNORMAL LOW (ref 4.22–5.81)
RDW: 17.9 % — ABNORMAL HIGH (ref 11.5–15.5)
WBC: 19.1 10*3/uL — ABNORMAL HIGH (ref 4.0–10.5)

## 2014-01-17 LAB — MAGNESIUM: MAGNESIUM: 2.5 mg/dL (ref 1.5–2.5)

## 2014-01-17 MED ORDER — PANTOPRAZOLE SODIUM 40 MG PO PACK
40.0000 mg | PACK | ORAL | Status: DC
  Administered 2014-01-17: 40 mg
  Filled 2014-01-17 (×2): qty 20

## 2014-01-17 MED ORDER — SODIUM PHOSPHATE 3 MMOLE/ML IV SOLN
30.0000 mmol | Freq: Once | INTRAVENOUS | Status: AC
  Administered 2014-01-17: 30 mmol via INTRAVENOUS
  Filled 2014-01-17: qty 10

## 2014-01-17 NOTE — Progress Notes (Signed)
[facility] Progress Note Patient Name: [name] DOB: February 01, 1941 MRN: [id number]  Date of Service  01/17/2014   HPI/Events of Note  Hypophosphatemia   eICU Interventions  Phos replaced   Intervention Category Intermediate Interventions: Electrolyte abnormality - evaluation and management  Guadelupe Sabin Deterding 01/17/2014, 3:46 AM

## 2014-01-17 NOTE — Progress Notes (Signed)
PULMONARY / CRITICAL CARE MEDICINE   Name: [name] MRN: [id number] DOB: [birth year]    ADMISSION DATE:  01/13/2014 CONSULTATION DATE:  01/13/2014  REFERRING MD :  EDP PRIMARY SERVICE: PCCM  CHIEF COMPLAINT:  Back pain  BRIEF PATIENT DESCRIPTION: 73 year old male with HTN and DM presented 5/27 to ED c/o N/V since 5/25. In ED found to be in acute renal failure, hyperkalemic, hypotensive with SBP's in 70s. Started on BiPAP for hypoxia and increased WOB. PCCM asked to admit.   SIGNIFICANT EVENTS:  5/27 admitted 5/28 intubated, shock, pressors, CVVH 5/30 Start pressure support trials  STUDIES: Echo 5/28 >> EF 65 to 00%, grade 2 diastolic dysfx, mod AS Renal u/s 5/28 >> medical renal disease  LINES / TUBES: ETT 5/28 >>  Rt radial aline 5/28 >> Rt IJ CVL 5/28 >>  Lt IJ HD catheter 5/28 >>   CULTURES: Blood 5/27 >>> Urine 5/27 >>> neg Sputum 5/27 >>>  ANTIBIOTICS: Zosyn 5/27 >>> Vancomycin 5/27 >>>  SUBJECTIVE: Tolerating pressure support.  Remains on low dose pressors with CRRT.  VITAL SIGNS: Temp:  [97.6 F (36.4 C)-98.9 F (37.2 C)] 98 F (36.7 C) (05/31 0808) Pulse Rate:  [83-115] 83 (05/31 1000) Resp:  [11-30] 12 (05/31 1000) BP: (69-154)/(42-83) 149/64 mmHg (05/31 1000) SpO2:  [86 %-100 %] 97 % (05/31 1000) Arterial Line BP: (90-195)/(37-71) 187/65 mmHg (05/31 1000) FiO2 (%):  [40 %-50 %] 40 % (05/31 1000) Weight:  [205 lb 14.6 oz (93.4 kg)] 205 lb 14.6 oz (93.4 kg) (05/31 0300) VENTILATOR SETTINGS: Vent Mode:  [-] PSV;CPAP FiO2 (%):  [40 %-50 %] 40 % Set Rate:  [30 bmp] 30 bmp Vt Set:  [550 mL] 550 mL PEEP:  [5 cmH20-10 cmH20] 8 cmH20 Pressure Support:  [4 cmH20] 4 cmH20 Plateau Pressure:  [19 cmH20] 19 cmH20 INTAKE / OUTPUT: Intake/Output     05/30 0701 - 05/31 0700 05/31 0701 - 06/01 0700   I.V. (mL/kg) 599.1 (6.4) 77.5 (0.8)   NG/GT 710    IV Piggyback 393 243   Total Intake(mL/kg) 1702.1 (18.2) 320.5 (3.4)   Other 2072 465   Total Output 2072  465   Net -369.9 -144.5          PHYSICAL EXAMINATION: General:  Elderly male , acutely ill, intubated, sedated Neuro:  Sedated, Non focal HEENT:  PERRL Cardiovascular:  tachy, regular, 2/6 SM Lungs:  Scattered rales/rhonchi Abdomen:  Soft, non-tender, non-distended Musculoskeletal:  No acute deformity Skin:  Intact  LABS:  CBC  Recent Labs Lab 01/15/14 1522 01/16/14 0242 01/17/14 0242  WBC 19.8* 17.2* 19.1*  HGB 8.7* 8.1* 7.7*  HCT 25.9* 23.4* 22.5*  PLT 49* 59* 67*   BMET  Recent Labs Lab 01/16/14 0242 01/16/14 1600 01/17/14 0243  NA 138  137 134* 136*  K 4.2  4.2 4.0 3.8  CL 99  98 100 100  CO2 24  24 25 24   BUN 36*  36* 37* 36*  CREATININE 2.22*  2.24* 1.89* 1.62*  GLUCOSE 135*  134* 153* 142*   Electrolytes  Recent Labs Lab 01/15/14 0500  01/16/14 0242 01/16/14 1600 01/17/14 0242 01/17/14 0243  CALCIUM 7.8*  < > 7.7*  7.8* 7.8*  --  7.8*  MG 2.3  --  2.5  --  2.5  --   PHOS 4.5  < > 3.0  3.0 1.8*  --  1.4*  < > = values in this interval not displayed.  Sepsis Markers  Recent  Labs Lab 01/13/14 2137 01/14/14 0007  LATICACIDVEN 8.59* 4.9*  PROCALCITON  --  1.62   ABG  Recent Labs Lab 01/14/14 1937 01/15/14 0453 01/16/14 1231  PHART 7.255* 7.332* 7.409  PCO2ART 49.3* 51.0* 35.9  PO2ART 118.0* 115.0* 53.0*   Liver Enzymes  Recent Labs Lab 01/13/14 2125  01/15/14 0500  01/16/14 0242 01/16/14 1600 01/17/14 0243  AST 53*  --  38*  --  44*  --   --   ALT 19  --  19  --  20  --   --   ALKPHOS 81  --  59  --  67  --   --   BILITOT 0.9  --  0.8  --  0.8  --   --   ALBUMIN 3.5  < > 2.6*  < > 2.5*  2.5* 2.4* 2.5*  < > = values in this interval not displayed.  Cardiac Enzymes  Recent Labs Lab 01/13/14 2125 01/14/14 0428 01/14/14 1207  TROPONINI  --  1.44* 0.97*  PROBNP 35530.0*  --   --    Glucose  Recent Labs Lab 01/16/14 1136 01/16/14 1518 01/16/14 1940 01/17/14 0001 01/17/14 0422 01/17/14 0729  GLUCAP  143* 133* 143* 113* 121* 146*    Imaging Dg Chest Port 1 View  01/16/2014   CLINICAL DATA:  Aspiration pneumonia  EXAM: PORTABLE CHEST - 1 VIEW  COMPARISON:  01/15/2014  FINDINGS: Endotracheal tube ends in the mid thoracic trachea. Unchanged positioning of bilateral IJ catheters, tips at the level of the SVC. Enteric tube enters the stomach.  Bibasilar airspace disease shows no progression or clearing. No evidence of increasing pleural fluid or air leak.  IMPRESSION: 1. Stable positioning of tubes and lines. 2. Unchanged bibasilar pneumonia.   Electronically Signed   By: Jorje Guild M.D.   On: 01/16/2014 07:18    ASSESSMENT / PLAN:  PULMONARY A: Acute Hypoxemic Respiratory Failure 2nd to PNA, and pulmonary edema. P:   Pressure support wean as tolerated F/u CXR Not ready for extubation yet  CARDIOVASCULAR A:  Septic shock. NSTEMI likely from demand ischemia. Hx of HTN. P:  Wean levo to keep MAP > 65 Continue stress dose steroids until off pressors  RENAL A:   Acute renal failure - concern for NSAID induced nephropathy, also takes ACE-i P:   CRRT per nephrology Monitor BMP  GASTROINTESTINAL A:   Nutrition. P:   Tube feeds while on vent  HEMATOLOGIC A:  Anemia of critical illness and chronic disease. Thrombocytopenia of critical illness. P:  Follow CBC SCD's  INFECTIOUS A:   Septic shock 2nd to PNA. P:   Day 5 of vancomycin, zosyn  ENDOCRINE A:   DM without hyperglycemia - takes metformin at home.  P:   SSI  NEUROLOGIC A:  Chronic low back pain   P:   Hold NSAIDs fent gtt RASS goal -1  CC time 35 minutes.  [name], MD [facility] Pulmonary/Critical Care 01/17/2014, 10:28 AM Pager:  [phone] After 3pm call: [phone]

## 2014-01-17 NOTE — Progress Notes (Signed)
Subjective: Interval History: has no complaint. bp volatile, on and off pressors.  Not much responsiveness except ^ WOB when sedation taken off at this time..  Objective: Vital signs in last 24 hours: Temp:  [97.6 F (36.4 C)-98.9 F (37.2 C)] 98.4 F (36.9 C) (05/31 0432) Pulse Rate:  [75-115] 93 (05/31 0700) Resp:  [12-30] 12 (05/31 0700) BP: (69-140)/(42-83) 129/59 mmHg (05/31 0700) SpO2:  [86 %-100 %] 98 % (05/31 0700) Arterial Line BP: (90-170)/(37-57) 135/51 mmHg (05/31 0700) FiO2 (%):  [40 %-50 %] 50 % (05/31 0351) Weight:  [93.4 kg (205 lb 14.6 oz)] 93.4 kg (205 lb 14.6 oz) (05/31 0300) Weight change: 1.8 kg (3 lb 15.5 oz)  Intake/Output from previous day: 05/30 0701 - 05/31 0700 In: 1631.6 [I.V.:571.6; NG/GT:710; IV Piggyback:350] Out: 2072  Intake/Output this shift:    General appearance: pale and sedated on vent, Resp: rales bibasilar and rhonchi bibasilar Cardio: S1, S2 normal and systolic murmur: holosystolic 3/6, blowing at apex GI: pos bs, soft, liver down 4 cm Extremities: edema 1+ and callus Timothy foot  Lab Results:  Recent Labs  01/16/14 0242 01/17/14 0242  WBC 17.2* 19.1*  HGB 8.1* 7.7*  HCT 23.4* 22.5*  PLT 59* 67*   BMET:  Recent Labs  01/16/14 1600 01/17/14 0243  NA 134* 136*  K 4.0 3.8  CL 100 100  CO2 25 24  GLUCOSE 153* 142*  BUN 37* 36*  CREATININE 1.89* 1.62*  CALCIUM 7.8* 7.8*   No results found for this basename: PTH,  in the last 72 hours Iron Studies:  Recent Labs  01/16/14 0830  IRON 72  TIBC 220    Studies/Results: Dg Chest Port 1 View  01/16/2014   CLINICAL DATA:  Aspiration pneumonia  EXAM: PORTABLE CHEST - 1 VIEW  COMPARISON:  01/15/2014  FINDINGS: Endotracheal tube ends in the mid thoracic trachea. Unchanged positioning of bilateral IJ catheters, tips at the level of the SVC. Enteric tube enters the stomach.  Bibasilar airspace disease shows no progression or clearing. No evidence of increasing pleural fluid or air  leak.  IMPRESSION: 1. Stable positioning of tubes and lines. 2. Unchanged bibasilar pneumonia.   Electronically Signed   By: Jorje Guild M.D.   On: 01/16/2014 07:18    I have reviewed the patient's current medications.  Assessment/Plan: 1 AKI/CKD ? Baseline .  On ACEI, Metformen, NSAIDs,  . Now oliguric .  CRRT clearance of solute, acid/base/K on .  Getting some vol off to help resp status.   2 VDRF suspect ASP and this is primary cause of septic picture 3 low PTLT improving, sepsis 4 DM 5 Sepsis Vanc/Zosyn   6 Nonresponsive hopefully clear up lungs and allow prolonged times off sedation. 7 Nutrition TF P AB,CRRT, support bp, vent,     LOS: 4 days   [name] [name] 01/17/2014,7:08 AM

## 2014-01-18 ENCOUNTER — Inpatient Hospital Stay (HOSPITAL_COMMUNITY): Payer: Medicare Other

## 2014-01-18 DIAGNOSIS — A419 Sepsis, unspecified organism: Secondary | ICD-10-CM

## 2014-01-18 LAB — GLUCOSE, CAPILLARY
GLUCOSE-CAPILLARY: 131 mg/dL — AB (ref 70–99)
GLUCOSE-CAPILLARY: 149 mg/dL — AB (ref 70–99)
Glucose-Capillary: 101 mg/dL — ABNORMAL HIGH (ref 70–99)
Glucose-Capillary: 111 mg/dL — ABNORMAL HIGH (ref 70–99)
Glucose-Capillary: 127 mg/dL — ABNORMAL HIGH (ref 70–99)
Glucose-Capillary: 135 mg/dL — ABNORMAL HIGH (ref 70–99)

## 2014-01-18 LAB — CBC
HEMATOCRIT: 24 % — AB (ref 39.0–52.0)
HEMOGLOBIN: 7.8 g/dL — AB (ref 13.0–17.0)
MCH: 28.6 pg (ref 26.0–34.0)
MCHC: 32.5 g/dL (ref 30.0–36.0)
MCV: 87.9 fL (ref 78.0–100.0)
Platelets: 96 10*3/uL — ABNORMAL LOW (ref 150–400)
RBC: 2.73 MIL/uL — ABNORMAL LOW (ref 4.22–5.81)
RDW: 18.4 % — ABNORMAL HIGH (ref 11.5–15.5)
WBC: 19.9 10*3/uL — ABNORMAL HIGH (ref 4.0–10.5)

## 2014-01-18 LAB — COMPREHENSIVE METABOLIC PANEL
ALT: 26 U/L (ref 0–53)
AST: 54 U/L — ABNORMAL HIGH (ref 0–37)
Albumin: 2.4 g/dL — ABNORMAL LOW (ref 3.5–5.2)
Alkaline Phosphatase: 102 U/L (ref 39–117)
BUN: 38 mg/dL — ABNORMAL HIGH (ref 6–23)
CALCIUM: 7.9 mg/dL — AB (ref 8.4–10.5)
CO2: 25 meq/L (ref 19–32)
CREATININE: 1.6 mg/dL — AB (ref 0.50–1.35)
Chloride: 101 mEq/L (ref 96–112)
GFR, EST AFRICAN AMERICAN: 48 mL/min — AB (ref >90)
GFR, EST NON AFRICAN AMERICAN: 41 mL/min — AB (ref >90)
GLUCOSE: 151 mg/dL — AB (ref 70–99)
Potassium: 3.6 mEq/L — ABNORMAL LOW (ref 3.7–5.3)
Sodium: 139 mEq/L (ref 137–147)
Total Bilirubin: 0.8 mg/dL (ref 0.3–1.2)
Total Protein: 6.2 g/dL (ref 6.0–8.3)

## 2014-01-18 LAB — VANCOMYCIN, TROUGH: VANCOMYCIN TR: 12.3 ug/mL (ref 10.0–20.0)

## 2014-01-18 LAB — RENAL FUNCTION PANEL
ALBUMIN: 2.5 g/dL — AB (ref 3.5–5.2)
BUN: 38 mg/dL — ABNORMAL HIGH (ref 6–23)
CALCIUM: 8.2 mg/dL — AB (ref 8.4–10.5)
CO2: 26 mEq/L (ref 19–32)
CREATININE: 1.68 mg/dL — AB (ref 0.50–1.35)
Chloride: 101 mEq/L (ref 96–112)
GFR calc Af Amer: 45 mL/min — ABNORMAL LOW (ref >90)
GFR, EST NON AFRICAN AMERICAN: 39 mL/min — AB (ref >90)
Glucose, Bld: 99 mg/dL (ref 70–99)
Phosphorus: 1.3 mg/dL — ABNORMAL LOW (ref 2.3–4.6)
Potassium: 3.5 mEq/L — ABNORMAL LOW (ref 3.7–5.3)
Sodium: 140 mEq/L (ref 137–147)

## 2014-01-18 LAB — PHOSPHORUS: PHOSPHORUS: 2 mg/dL — AB (ref 2.3–4.6)

## 2014-01-18 LAB — MAGNESIUM: Magnesium: 2.6 mg/dL — ABNORMAL HIGH (ref 1.5–2.5)

## 2014-01-18 MED ORDER — DEXTROSE 5 % IV SOLN
30.0000 mmol | Freq: Once | INTRAVENOUS | Status: AC
  Administered 2014-01-19: 30 mmol via INTRAVENOUS
  Filled 2014-01-18: qty 10

## 2014-01-18 MED ORDER — VANCOMYCIN HCL 500 MG IV SOLR
500.0000 mg | Freq: Once | INTRAVENOUS | Status: AC
  Administered 2014-01-18: 500 mg via INTRAVENOUS
  Filled 2014-01-18: qty 500

## 2014-01-18 MED ORDER — VANCOMYCIN HCL 10 G IV SOLR
1250.0000 mg | INTRAVENOUS | Status: DC
  Filled 2014-01-18: qty 1250

## 2014-01-18 MED ORDER — PANTOPRAZOLE SODIUM 40 MG IV SOLR
40.0000 mg | INTRAVENOUS | Status: DC
  Administered 2014-01-18 – 2014-01-27 (×8): 40 mg via INTRAVENOUS
  Filled 2014-01-18 (×11): qty 40

## 2014-01-18 NOTE — Progress Notes (Signed)
RT extubated patient to 4 liter nasal cannula. Patient did not tolerate well. O2 saturations declined to 83%. RT placed patient on 55% Venti-Mask. O2 saturations are now 96%. Will try cannula at a later time. Patient not able to speak due to mental status. Cuff leak was present before extubation. Patient has diminished bilateral breath sounds. Vital signs stable at this time. Rt will continue to monitor.

## 2014-01-18 NOTE — Progress Notes (Signed)
PULMONARY / CRITICAL CARE MEDICINE   Name: [name] MRN: [id number] DOB: 1941/01/10    ADMISSION DATE:  01/13/2014 CONSULTATION DATE:  01/13/2014  REFERRING MD :  EDP PRIMARY SERVICE: PCCM  CHIEF COMPLAINT:  Back pain  BRIEF PATIENT DESCRIPTION: 73 year old male with HTN and DM presented 5/27 to ED c/o N/V since 5/25. In ED found to be in acute renal failure, hyperkalemic, hypotensive with SBP's in 70s. Started on BiPAP for hypoxia and increased WOB. PCCM asked to admit.   SIGNIFICANT EVENTS:  5/27 admitted 5/28 intubated, shock, pressors, CVVH 5/30 Start pressure support trials  6/1 Plan to extubate, off pressors  STUDIES: Echo 5/28 >> EF 65 to 57%, grade 2 diastolic dysfx, mod AS Renal u/s 5/28 >> medical renal disease  LINES / TUBES: ETT 5/28 >> 6/1 Rt radial aline 5/28 >>6/1 Rt IJ CVL 5/28 >>  Lt IJ HD catheter 5/28 >>   CULTURES: Blood 5/27 >>> Urine 5/27 >>> neg Sputum 5/27 >>>neg  ANTIBIOTICS: Zosyn 5/27 >>> Vancomycin 5/27 >>>  SUBJECTIVE: Extubated this am   VITAL SIGNS: Temp:  [98.1 F (36.7 C)-98.8 F (37.1 C)] 98.1 F (36.7 C) (06/01 0858) Pulse Rate:  [87-109] 100 (06/01 0751) Resp:  [11-23] 19 (06/01 0751) BP: (111-153)/(57-77) 136/71 mmHg (06/01 0751) SpO2:  [87 %-98 %] 96 % (06/01 0751) Arterial Line BP: (163-226)/(49-189) 189/69 mmHg (06/01 0700) FiO2 (%):  [40 %] 40 % (06/01 0752) Weight:  [88 kg (194 lb 0.1 oz)] 88 kg (194 lb 0.1 oz) (06/01 0400) VENTILATOR SETTINGS: Vent Mode:  [-] CPAP;PSV FiO2 (%):  [40 %] 40 % PEEP:  [5 cmH20] 5 cmH20 Pressure Support:  [5 cmH20] 5 cmH20 INTAKE / OUTPUT: Intake/Output     05/31 0701 - 06/01 0700 06/01 0701 - 06/02 0700   I.V. (mL/kg) 425.1 (4.8)    NG/GT 920    IV Piggyback 572    Total Intake(mL/kg) 1917.1 (21.8)    Other 3438 465   Stool 1400    Total Output 4838 465   Net -2920.9 -465          PHYSICAL EXAMINATION: General:  Elderly male , acutely ill.  Neuro:   Opens eyes and has  purposeful movement to pain, does not follow commands.  HEENT:  PERRL Cardiovascular:  tachy, regular, 2/6 SM Lungs:  Rales/rhonchi in bilateral bases Abdomen:  Soft, non-tender, non-distended Musculoskeletal:  No acute deformity  Skin:  Intact  LABS:  CBC  Recent Labs Lab 01/16/14 0242 01/17/14 0242 01/18/14 0445  WBC 17.2* 19.1* 19.9*  HGB 8.1* 7.7* 7.8*  HCT 23.4* 22.5* 24.0*  PLT 59* 67* 96*   BMET  Recent Labs Lab 01/17/14 0243 01/17/14 1600 01/18/14 0445  NA 136* 137 139  K 3.8 3.4* 3.6*  CL 100 100 101  CO2 24 24 25   BUN 36* 36* 38*  CREATININE 1.62* 1.66* 1.60*  GLUCOSE 142* 142* 151*   Electrolytes  Recent Labs Lab 01/16/14 0242  01/17/14 0242 01/17/14 0243 01/17/14 1600 01/18/14 0445  CALCIUM 7.7*  7.8*  < >  --  7.8* 7.9* 7.9*  MG 2.5  --  2.5  --   --  2.6*  PHOS 3.0  3.0  < >  --  1.4* 1.6* 2.0*  < > = values in this interval not displayed.  Sepsis Markers  Recent Labs Lab 01/13/14 2137 01/14/14 0007  LATICACIDVEN 8.59* 4.9*  PROCALCITON  --  1.62   ABG  Recent Labs  Lab 01/15/14 0453 01/16/14 1231 01/17/14 0624  PHART 7.332* 7.409 7.411  PCO2ART 51.0* 35.9 41.5  PO2ART 115.0* 53.0* 83.0   Liver Enzymes  Recent Labs Lab 01/15/14 0500  01/16/14 0242  01/17/14 0243 01/17/14 1600 01/18/14 0445  AST 38*  --  44*  --   --   --  54*  ALT 19  --  20  --   --   --  26  ALKPHOS 59  --  67  --   --   --  102  BILITOT 0.8  --  0.8  --   --   --  0.8  ALBUMIN 2.6*  < > 2.5*  2.5*  < > 2.5* 2.4* 2.4*  < > = values in this interval not displayed.  Cardiac Enzymes  Recent Labs Lab 01/13/14 2125 01/14/14 0428 01/14/14 1207  TROPONINI  --  1.44* 0.97*  PROBNP 35530.0*  --   --    Glucose  Recent Labs Lab 01/17/14 1117 01/17/14 1513 01/17/14 1948 01/18/14 0018 01/18/14 0400 01/18/14 0846  GLUCAP 187* 120* 132* 127* 131* 135*    Imaging Dg Chest Port 1 View  01/18/2014   CLINICAL DATA:  Check endotracheal tube  position.  EXAM: PORTABLE CHEST - 1 VIEW  COMPARISON:  01/16/2014.  FINDINGS: Endotracheal tube terminates approximately 3.1 cm above the carina. Nasogastric tube is followed into the stomach. Right IJ central line tip projects over the low SVC. Left IJ catheter tip projects over the brachiocephalic vein confluence or upper SVC. Heart is enlarged, stable. Thoracic aorta is calcified. There is mild-to-moderate diffuse bilateral airspace disease with slight improvement in aeration at the right lung base. No definite pleural fluid.  IMPRESSION: Mild to moderate diffuse bilateral airspace disease, likely due to pulmonary edema. Slight improvement in aeration at the right lung base.   Electronically Signed   By: Lorin Picket M.D.   On: 01/18/2014 07:40    ASSESSMENT / PLAN:  PULMONARY A: Acute Hypoxemic Respiratory Failure 2nd to PNA, and pulmonary edema - resolving, 01/18/14: CXR improving, stable ABG Extubated this 6/1 P:   F/u CXR am  Wean FIO2 Mobilize when able   CARDIOVASCULAR A:  Septic shock - improving NSTEMI likely from demand ischemia. Hx of HTN. P:  D/C arterial line D/C levo and stress dose steroids  Cont tele   RENAL A:   Acute renal failure - concern for NSAID induced nephropathy, also takes ACE-i - improving P:   Per nephrology 6/1: Cont UF at 129mL/hr negative for another 24h. Likely off CRRT tomorrow.  Monitor BMP   GASTROINTESTINAL A:   Nutrition. P:   Swallow eval ordered  HEMATOLOGIC A:  Anemia of critical illness and chronic disease. Thrombocytopenia of critical illness. P:  Follow CBC SCD's  INFECTIOUS A:   Septic shock 2nd to PNA  P:   Day 5/7 of vancomycin, zosyn  ENDOCRINE A:   DM without hyperglycemia - takes metformin at home.  P:   Change CBGs to q12  NEUROLOGIC A:  Chronic low back pain, altered mental status 2/2 ICU delirum?  P:   Hold NSAIDs fent PRN RASS goal 0 Frequent re-orientation and promote sleep at night and awake  during the day.    Reviewed above, examined and agree.  He has done well with volume removal, and extubated this AM.  Remains on CRRT.  Mental status slowly improving >> likely lingering effect of sedation.  CC time 35 minutes.  [name], MD  [place] 01/18/2014, 2:04 PM Pager:  [phone] After 3pm call: [phone]

## 2014-01-18 NOTE — Progress Notes (Signed)
UR Completed.  [name] [id number] 01/18/2014

## 2014-01-18 NOTE — Progress Notes (Addendum)
ANTIBIOTIC CONSULT NOTE - FOLLOW UP  Pharmacy Consult for vancomycin; Zosyn Indication: sepsis  No Known Allergies  Patient Measurements: Height: 5\' 11"  (180.3 cm) Weight: 194 lb 0.1 oz (88 kg) IBW/kg (Calculated) : 75.3 Vital Signs: Temp: 98.1 F (36.7 C) (06/01 0858) Temp src: Oral (06/01 0858) BP: 136/71 mmHg (06/01 0751) Pulse Rate: 100 (06/01 0751) Intake/Output from previous day: 05/31 0701 - 06/01 0700 In: 1917.1 [I.V.:425.1; NG/GT:920; IV Piggyback:572] Out: 4193 [Stool:1400] Intake/Output from this shift: Total I/O In: -  Out: 310 [Other:310] Labs:  Recent Labs  01/16/14 0242  01/17/14 0242 01/17/14 0243 01/17/14 1600 01/18/14 0445  WBC 17.2*  --  19.1*  --   --  19.9*  HGB 8.1*  --  7.7*  --   --  7.8*  PLT 59*  --  67*  --   --  96*  CREATININE 2.22*  2.24*  < >  --  1.62* 1.66* 1.60*  < > = values in this interval not displayed. Estimated Creatinine Clearance: 44.4 ml/min (by C-G formula based on Cr of 1.6).  Recent Labs  01/18/14 0900  VANCOTROUGH 12.3     Assessment: 73 yo male with VDRF started on vancomycin and zosyn for r/o sepsis. Patient noted on levophed and vasopressin, but off nimbex.  WBC= 19.9 (no change), afebrile, LA= 4.9 & PCT= 1.62 on admit but not rechecked. SCr improving on CRRT >> plan to transition off likely tomorrow to intermittent HD.   Vancomycin trough 12.3 on Vancomycin 1g IV q24hr. Dose given by RN today post trough.   5/27 blood x2 - NGTD 5/27 urine - negative 5/27 resp cx - neg  Goal of Therapy:  Vancomycin trough level 15-20 mcg/ml  Plan:  -Extra 500mg  of vancomycin today. Then Vancomycin 1250mg  IV q24 while on CRRT.  -F/up plan to change to intermittent HD tomorrow. -Will consider pre-HD level once off CRRT in AM.  -Will follow renal function, cultures and clinical progress -Continue Zosyn 3.375g IV q6h - 30 min infusion  Sloan Leiter, PharmD, BCPS Clinical Pharmacist [phone] 01/18/2014 10:28 AM

## 2014-01-18 NOTE — Procedures (Signed)
Admit: 01/13/2014 LOS: 5  25M w/ AKI (? BL SCr) 2/2 NSAIDs, ACEi, vol depletion w/ aspiration PNA and Septic Shock, VDRF and ARDS  Current CRRT Prescription: Start Date: 01/14/14 Catheter: L IJ BFR: 250 Pre Blood Pump: 800 4K DFR: 2000 Replacement Rate: 800 4K Goal UF: -53mL/hr Anticoagulation: none Clotting: ~q12h  S: Off pressors Net negative 1.6L yesterday, tolerating UF -24mL/hr Tolerating PSV   O: 05/31 0701 - 06/01 0700 In: 1917.1 [I.V.:425.1; NG/GT:920; IV Piggyback:572] Out: 2197 [Stool:1400]  Filed Weights   01/16/14 0223 01/17/14 0300 01/18/14 0400  Weight: 91.6 kg (201 lb 15.1 oz) 93.4 kg (205 lb 14.6 oz) 88 kg (194 lb 0.1 oz)     Recent Labs Lab 01/17/14 0243 01/17/14 1600 01/18/14 0445  NA 136* 137 139  K 3.8 3.4* 3.6*  CL 100 100 101  CO2 24 24 25   GLUCOSE 142* 142* 151*  BUN 36* 36* 38*  CREATININE 1.62* 1.66* 1.60*  CALCIUM 7.8* 7.9* 7.9*  PHOS 1.4* 1.6* 2.0*    Recent Labs Lab 01/13/14 1949 01/13/14 2125  01/16/14 0242 01/17/14 0242 01/18/14 0445  WBC 6.4  20.1* 19.2*  < > 17.2* 19.1* 19.9*  NEUTROABS 3.9  15.1* 14.4*  --   --   --   --   HGB 13.8  11.7* 10.8*  < > 8.1* 7.7* 7.8*  HCT 42.1  34.8* 32.7*  < > 23.4* 22.5* 24.0*  MCV 101.4*  85.7 85.4  < > 84.5 86.2 87.9  PLT 131*  32* 26*  < > 59* 67* 96*  < > = values in this interval not displayed.  ABG    Component Value Date/Time   PHART 7.411 01/17/2014 0624   PCO2ART 41.5 01/17/2014 0624   PO2ART 83.0 01/17/2014 0624   HCO3 26.3* 01/17/2014 0624   TCO2 28 01/17/2014 0624   ACIDBASEDEF 2.0 01/16/2014 1231   O2SAT 96.0 01/17/2014 0624    Plan:  1. Dialysis dependent AKI 2/2 ATN from NSAID/ACEi/Vol Depletion: Improved hemodynamics, on PSV.  Cont UF at 133mL/hr negative for another 24h.  Likely off CRRT tomorrow.  Lytes stable.    Pearson Grippe, MD [facility] pgr [phone]

## 2014-01-18 NOTE — Progress Notes (Signed)
[facility] Progress Note Patient Name: DIANTE BARLEY DOB: 1941-06-14 MRN: [id number]  Date of Service  01/18/2014   HPI/Events of Note  Hypokalemia and hypophosphatemia   eICU Interventions  Potassium and phos replaced   Intervention Category Intermediate Interventions: Electrolyte abnormality - evaluation and management  Guadelupe Sabin Sharlett Lienemann 01/18/2014, 11:55 PM

## 2014-01-19 ENCOUNTER — Inpatient Hospital Stay (HOSPITAL_COMMUNITY): Payer: Medicare Other

## 2014-01-19 DIAGNOSIS — R404 Transient alteration of awareness: Secondary | ICD-10-CM

## 2014-01-19 LAB — RENAL FUNCTION PANEL
Albumin: 2.6 g/dL — ABNORMAL LOW (ref 3.5–5.2)
BUN: 29 mg/dL — AB (ref 6–23)
CO2: 25 mEq/L (ref 19–32)
CREATININE: 1.61 mg/dL — AB (ref 0.50–1.35)
Calcium: 8.5 mg/dL (ref 8.4–10.5)
Chloride: 99 mEq/L (ref 96–112)
GFR calc Af Amer: 48 mL/min — ABNORMAL LOW (ref >90)
GFR calc non Af Amer: 41 mL/min — ABNORMAL LOW (ref >90)
GLUCOSE: 134 mg/dL — AB (ref 70–99)
PHOSPHORUS: 2.3 mg/dL (ref 2.3–4.6)
POTASSIUM: 3.9 meq/L (ref 3.7–5.3)
Sodium: 138 mEq/L (ref 137–147)

## 2014-01-19 LAB — CBC
HCT: 25.7 % — ABNORMAL LOW (ref 39.0–52.0)
Hemoglobin: 8.5 g/dL — ABNORMAL LOW (ref 13.0–17.0)
MCH: 28.9 pg (ref 26.0–34.0)
MCHC: 33.1 g/dL (ref 30.0–36.0)
MCV: 87.4 fL (ref 78.0–100.0)
PLATELETS: 165 10*3/uL (ref 150–400)
RBC: 2.94 MIL/uL — AB (ref 4.22–5.81)
RDW: 19 % — ABNORMAL HIGH (ref 11.5–15.5)
WBC: 24.1 10*3/uL — ABNORMAL HIGH (ref 4.0–10.5)

## 2014-01-19 LAB — GLUCOSE, CAPILLARY
GLUCOSE-CAPILLARY: 114 mg/dL — AB (ref 70–99)
GLUCOSE-CAPILLARY: 134 mg/dL — AB (ref 70–99)
GLUCOSE-CAPILLARY: 129 mg/dL — AB (ref 70–99)
Glucose-Capillary: 122 mg/dL — ABNORMAL HIGH (ref 70–99)
Glucose-Capillary: 126 mg/dL — ABNORMAL HIGH (ref 70–99)
Glucose-Capillary: 126 mg/dL — ABNORMAL HIGH (ref 70–99)

## 2014-01-19 LAB — MAGNESIUM: Magnesium: 2.6 mg/dL — ABNORMAL HIGH (ref 1.5–2.5)

## 2014-01-19 MED ORDER — PIPERACILLIN-TAZOBACTAM IN DEX 2-0.25 GM/50ML IV SOLN
2.2500 g | Freq: Three times a day (TID) | INTRAVENOUS | Status: DC
  Administered 2014-01-19 – 2014-01-20 (×2): 2.25 g via INTRAVENOUS
  Filled 2014-01-19 (×4): qty 50

## 2014-01-19 MED ORDER — METOPROLOL TARTRATE 1 MG/ML IV SOLN
5.0000 mg | Freq: Four times a day (QID) | INTRAVENOUS | Status: DC | PRN
  Filled 2014-01-19: qty 5

## 2014-01-19 NOTE — Progress Notes (Signed)
SLP Cancellation Note  Patient Details Name: [name] MRN: [id number] DOB: 05/20/1941   Cancelled treatment:       Reason Eval/Treat Not Completed: Patient's level of consciousness. Mental status not yet appropriate for swallow eval. Call if pt changes today.  Herbie [place], [place] CCC-SLP [place] Chaia Ikard 01/19/2014, 8:15 AM

## 2014-01-19 NOTE — Progress Notes (Addendum)
ANTIBIOTIC CONSULT NOTE - FOLLOW UP  Pharmacy Consult for vancomycin; Zosyn Indication: sepsis  No Known Allergies  Patient Measurements: Height: 5\' 11"  (180.3 cm) Weight: 187 lb 6.3 oz (85 kg) IBW/kg (Calculated) : 75.3 Vital Signs: Temp: 99 F (37.2 C) (06/02 0902) Temp src: Oral (06/02 0902) BP: 139/83 mmHg (06/02 0900) Pulse Rate: 114 (06/02 0900) Intake/Output from previous day: 06/01 0701 - 06/02 0700 In: 1237 [I.V.:195; NG/GT:120; IV Piggyback:922] Out: 4923 [Stool:600] Intake/Output from this shift: Total I/O In: 20 [I.V.:20] Out: 304 [Other:304] Labs:  Recent Labs  01/17/14 0242  01/18/14 0445 01/18/14 1600 01/19/14 0445  WBC 19.1*  --  19.9*  --  24.1*  HGB 7.7*  --  7.8*  --  8.5*  PLT 67*  --  96*  --  165  CREATININE  --   < > 1.60* 1.68* 1.61*  < > = values in this interval not displayed. Estimated Creatinine Clearance: 44.2 ml/min (by C-G formula based on Cr of 1.61).  Recent Labs  01/18/14 0900  VANCOTROUGH 12.3     Assessment: 73 yo male with VDRF started on vancomycin and zosyn for r/o sepsis. Patient is now off pressors. WBC 24.1, afebrile. Patient on CRRT- but to stop when filter clots. Tentative transition to [facility] tomorrow.   5/27 blood x2 - NGTD 5/27 urine - negative 5/27 resp cx - neg  Goal of Therapy:  Vancomycin trough level 15-20 mcg/ml  Plan:  -No vanc today.  -Vancomycin random level in AM -Will follow renal function, cultures and clinical progress -Will follow-up filter clotting then adjust Zosyn to 2.25g IV q8h.   Sloan Leiter, PharmD, BCPS Clinical Pharmacist [phone] 01/19/2014 10:19 AM  Addendum: Madaline Brilliant to stop abx when off CRRT per CCM. Will cancel level in AM.  11:41 AM

## 2014-01-19 NOTE — Progress Notes (Signed)
PULMONARY / CRITICAL CARE MEDICINE   Name: [name] MRN: [id number] DOB: 1941-05-10    ADMISSION DATE:  01/13/2014 CONSULTATION DATE:  01/13/2014  REFERRING MD :  EDP PRIMARY SERVICE: PCCM  CHIEF COMPLAINT:  Back pain  BRIEF PATIENT DESCRIPTION: 73 year old male with HTN and DM presented 5/27 to ED c/o N/V since 5/25. In ED found to be in acute renal failure, hyperkalemic, hypotensive with SBP's in 70s. Started on BiPAP for hypoxia and increased WOB. PCCM asked to admit.   SIGNIFICANT EVENTS:  5/27 admitted 5/28 intubated, shock, pressors, CVVH 5/30 Start pressure support trials  6/1  extubated, off pressors 6/2 plan to d/c CRRT  STUDIES: Echo 5/28 >> EF 65 to 93%, grade 2 diastolic dysfx, mod AS Renal u/s 5/28 >> medical renal disease  LINES / TUBES: ETT 5/28 >> 6/1 Rt radial aline 5/28 >>6/1 Rt IJ CVL 5/28 >>  Lt IJ HD catheter 5/28 >>   CULTURES: Blood 5/27 >>>  Urine 5/27 >>> neg Sputum 5/27 >>>neg  ANTIBIOTICS:  (ct while on CRRT) Zosyn 5/27 >>> Vancomycin 5/27 >>>  SUBJECTIVE: Breathing well on 55% venti mask, delirum improving  VITAL SIGNS: Temp:  [98.2 F (36.8 C)-99 F (37.2 C)] 99 F (37.2 C) (06/02 0902) Pulse Rate:  [92-117] 110 (06/02 0700) Resp:  [15-39] 27 (06/02 0700) BP: (147-184)/(72-152) 151/84 mmHg (06/02 0700) SpO2:  [91 %-98 %] 95 % (06/02 0700) Arterial Line BP: (189-218)/(78-84) 189/78 mmHg (06/01 1200) FiO2 (%):  [40 %-55 %] 55 % (06/02 0700) Weight:  [85 kg (187 lb 6.3 oz)] 85 kg (187 lb 6.3 oz) (06/02 0400) CVP:  [5 mmHg-6 mmHg] 5 mmHg INTAKE / OUTPUT: Intake/Output     06/01 0701 - 06/02 0700 06/02 0701 - 06/03 0700   I.V. (mL/kg) 195 (2.3)    NG/GT 120    IV Piggyback 922    Total Intake(mL/kg) 1237 (14.6)    Other 4323 304   Stool 600    Total Output 4923 304   Net -3686 -304        Urine Occurrence 1 x      PHYSICAL EXAMINATION: General:  Ill appearing elderly male Neuro:  Follows some commands, oriented to  self only. Opens eyes and moves all extremities. Agitated at times. HEENT:  PERRL Cardiovascular:  tachy, regular, 2/6 SM Lungs:  Diminished throughout Abdomen:  Soft, non-tender, non-distended Musculoskeletal:  No acute deformity Skin/Lines:  Intact, bilateral IJ central lines in place  LABS:  CBC  Recent Labs Lab 01/17/14 0242 01/18/14 0445 01/19/14 0445  WBC 19.1* 19.9* 24.1*  HGB 7.7* 7.8* 8.5*  HCT 22.5* 24.0* 25.7*  PLT 67* 96* 165   BMET  Recent Labs Lab 01/18/14 0445 01/18/14 1600 01/19/14 0445  NA 139 140 138  K 3.6* 3.5* 3.9  CL 101 101 99  CO2 25 26 25   BUN 38* 38* 29*  CREATININE 1.60* 1.68* 1.61*  GLUCOSE 151* 99 134*   Electrolytes  Recent Labs Lab 01/17/14 0242  01/18/14 0445 01/18/14 1600 01/19/14 0445  CALCIUM  --   < > 7.9* 8.2* 8.5  MG 2.5  --  2.6*  --  2.6*  PHOS  --   < > 2.0* 1.3* 2.3  < > = values in this interval not displayed.  Sepsis Markers  Recent Labs Lab 01/13/14 2137 01/14/14 0007  LATICACIDVEN 8.59* 4.9*  PROCALCITON  --  1.62   ABG  Recent Labs Lab 01/15/14 0453 01/16/14 1231 01/17/14  0624  PHART 7.332* 7.409 7.411  PCO2ART 51.0* 35.9 41.5  PO2ART 115.0* 53.0* 83.0   Liver Enzymes  Recent Labs Lab 01/15/14 0500  01/16/14 0242  01/18/14 0445 01/18/14 1600 01/19/14 0445  AST 38*  --  44*  --  54*  --   --   ALT 19  --  20  --  26  --   --   ALKPHOS 59  --  67  --  102  --   --   BILITOT 0.8  --  0.8  --  0.8  --   --   ALBUMIN 2.6*  < > 2.5*  2.5*  < > 2.4* 2.5* 2.6*  < > = values in this interval not displayed.  Cardiac Enzymes  Recent Labs Lab 01/13/14 2125 01/14/14 0428 01/14/14 1207  TROPONINI  --  1.44* 0.97*  PROBNP 35530.0*  --   --    Glucose  Recent Labs Lab 01/18/14 1146 01/18/14 1611 01/18/14 1954 01/19/14 0003 01/19/14 0420 01/19/14 0835  GLUCAP 149* 101* 111* 126* 126* 114*    Imaging Dg Chest Port 1 View  01/19/2014   CLINICAL DATA:  Cough  EXAM: PORTABLE CHEST  - 1 VIEW  COMPARISON:  01/18/2014  FINDINGS: Cardiac shadow is stable. A right jugular line and left jugular temporary dialysis catheter are again seen and stable. The endotracheal tube and nasogastric catheter have been removed. The lungs are well aerated bilaterally. Improved aeration is again seen bilaterally with persistent changes in the right lung base. No new focal infiltrate is seen.  IMPRESSION: Improved aeration with some persistent changes in the right base.  Tubes and lines as described.   Electronically Signed   By: Inez Catalina M.D.   On: 01/19/2014 06:53   Dg Chest Port 1 View  01/18/2014   CLINICAL DATA:  Check endotracheal tube position.  EXAM: PORTABLE CHEST - 1 VIEW  COMPARISON:  01/16/2014.  FINDINGS: Endotracheal tube terminates approximately 3.1 cm above the carina. Nasogastric tube is followed into the stomach. Right IJ central line tip projects over the low SVC. Left IJ catheter tip projects over the brachiocephalic vein confluence or upper SVC. Heart is enlarged, stable. Thoracic aorta is calcified. There is mild-to-moderate diffuse bilateral airspace disease with slight improvement in aeration at the right lung base. No definite pleural fluid.  IMPRESSION: Mild to moderate diffuse bilateral airspace disease, likely due to pulmonary edema. Slight improvement in aeration at the right lung base.   Electronically Signed   By: Lorin Picket M.D.   On: 01/18/2014 07:40    ASSESSMENT / PLAN:  PULMONARY A: Acute Hypoxemic Respiratory Failure 2nd to PNA, and pulmonary edema - resolving. Extubated 6/1. 6/02: CXR greatly improved with better aeration P:   F/u CXR intermittently Encourage cough and deep breathing  CARDIOVASCULAR A:  Septic shock - resolved NSTEMI likely from demand ischemia. Hx of HTN. P:  Continue tele  Initiate PRN IV metoprolol for tachycardia and htn?  RENAL A:   Acute renal failure - concern for NSAID induced nephropathy, also takes ACE-i - improving,  anuric  P:   Per nephrology 6/1: Continue UF at 135mL/hr negative for another 24h. Likely off CRRT tomorrow 6/03  Bladder scan revealed 64 ml of urine Monitor BMP   GASTROINTESTINAL A:   Nutrition. P:   Swallow eval ordered   HEMATOLOGIC A:  Anemia of critical illness and chronic disease. Thrombocytopenia of critical illness. P:  Follow CBC SCD's  INFECTIOUS A:  Septic shock 2nd to PNA  P:   Day 6/7 of vancomycin, zosyn -> Okay to D/C when off CRRT  ENDOCRINE A:   DM without hyperglycemia - takes metformin at home.  P:   Change CBGs to q12  NEUROLOGIC A:  Chronic low back pain, altered mental status 2/2 ICU delirum?  P:   Initiate PT/OT tomorrow morning Hold NSAIDs fent PRN RASS goal 0 Frequent re-orientation and promote sleep at night and awake during the day.    Reviewed above, examined and agree.  Respiratory status stable after extubation.  Plan to transition off CRRT later today.  Mental status slowly improving.  Will need to f/u with speech therapy for swallow eval >> add dextrose to IV fluid until able to eat.  Will arrange for PT/OT evaluation.  [name], MD [facility] Pulmonary/Critical Care 01/19/2014, 9:29 AM Pager:  [phone] After 3pm call: [phone]

## 2014-01-19 NOTE — Procedures (Signed)
Admit: 01/13/2014 LOS: 6  29M w/ AKI (? BL SCr) 2/2 NSAIDs, ACEi, vol depletion w/ aspiration PNA and Septic Shock, VDRF and ARDS  Current CRRT Prescription: Start Date: 01/14/14 Catheter: L IJ BFR: 200 Pre Blood Pump: 800 4K DFR: 2000 Replacement Rate: 800 4K Goal UF: -84mL/hr Anticoagulation: none Clotting: ~q12h  S: Extubated to FM yesterday No meaningful UOP Some AMS Off pressors  O: 06/01 0701 - 06/02 0700 In: 1237 [I.V.:195; NG/GT:120; IV Piggyback:922] Out: 4923 [Stool:600]  Filed Weights   01/17/14 0300 01/18/14 0400 01/19/14 0400  Weight: 93.4 kg (205 lb 14.6 oz) 88 kg (194 lb 0.1 oz) 85 kg (187 lb 6.3 oz)     Recent Labs Lab 01/18/14 0445 01/18/14 1600 01/19/14 0445  NA 139 140 138  K 3.6* 3.5* 3.9  CL 101 101 99  CO2 25 26 25   GLUCOSE 151* 99 134*  BUN 38* 38* 29*  CREATININE 1.60* 1.68* 1.61*  CALCIUM 7.9* 8.2* 8.5  PHOS 2.0* 1.3* 2.3    Recent Labs Lab 01/13/14 1949 01/13/14 2125  01/17/14 0242 01/18/14 0445 01/19/14 0445  WBC 6.4  20.1* 19.2*  < > 19.1* 19.9* 24.1*  NEUTROABS 3.9  15.1* 14.4*  --   --   --   --   HGB 13.8  11.7* 10.8*  < > 7.7* 7.8* 8.5*  HCT 42.1  34.8* 32.7*  < > 22.5* 24.0* 25.7*  MCV 101.4*  85.7 85.4  < > 86.2 87.9 87.4  PLT 131*  32* 26*  < > 67* 96* 165  < > = values in this interval not displayed.  ABG    Component Value Date/Time   PHART 7.411 01/17/2014 0624   PCO2ART 41.5 01/17/2014 0624   PO2ART 83.0 01/17/2014 0624   HCO3 26.3* 01/17/2014 0624   TCO2 28 01/17/2014 0624   ACIDBASEDEF 2.0 01/16/2014 1231   O2SAT 96.0 01/17/2014 0624    Plan:  1. Dialysis dependent AKI 2/2 ATN from NSAID/ACEi/Vol Depletion: Ready to transition to iHD and monitoring for recovery. Run CRRT until system near clotting today for more fluid removal then do not restart with tentative plan for iHD tomorrow for ongoing UF.  Good clearance currently.    Pearson Grippe, MD [facility] pgr [phone]

## 2014-01-20 ENCOUNTER — Encounter (HOSPITAL_COMMUNITY): Payer: Self-pay | Admitting: *Deleted

## 2014-01-20 LAB — RENAL FUNCTION PANEL
Albumin: 2.4 g/dL — ABNORMAL LOW (ref 3.5–5.2)
BUN: 65 mg/dL — ABNORMAL HIGH (ref 6–23)
CALCIUM: 8.8 mg/dL (ref 8.4–10.5)
CO2: 22 meq/L (ref 19–32)
Chloride: 101 mEq/L (ref 96–112)
Creatinine, Ser: 4.08 mg/dL — ABNORMAL HIGH (ref 0.50–1.35)
GFR, EST AFRICAN AMERICAN: 15 mL/min — AB (ref >90)
GFR, EST NON AFRICAN AMERICAN: 13 mL/min — AB (ref >90)
GLUCOSE: 132 mg/dL — AB (ref 70–99)
POTASSIUM: 4.1 meq/L (ref 3.7–5.3)
Phosphorus: 4.7 mg/dL — ABNORMAL HIGH (ref 2.3–4.6)
SODIUM: 141 meq/L (ref 137–147)

## 2014-01-20 LAB — GLUCOSE, CAPILLARY
GLUCOSE-CAPILLARY: 130 mg/dL — AB (ref 70–99)
Glucose-Capillary: 115 mg/dL — ABNORMAL HIGH (ref 70–99)
Glucose-Capillary: 125 mg/dL — ABNORMAL HIGH (ref 70–99)
Glucose-Capillary: 130 mg/dL — ABNORMAL HIGH (ref 70–99)
Glucose-Capillary: 134 mg/dL — ABNORMAL HIGH (ref 70–99)
Glucose-Capillary: 140 mg/dL — ABNORMAL HIGH (ref 70–99)

## 2014-01-20 LAB — CBC
HCT: 24.6 % — ABNORMAL LOW (ref 39.0–52.0)
Hemoglobin: 8.2 g/dL — ABNORMAL LOW (ref 13.0–17.0)
MCH: 29.1 pg (ref 26.0–34.0)
MCHC: 33.3 g/dL (ref 30.0–36.0)
MCV: 87.2 fL (ref 78.0–100.0)
PLATELETS: 212 10*3/uL (ref 150–400)
RBC: 2.82 MIL/uL — ABNORMAL LOW (ref 4.22–5.81)
RDW: 18.9 % — ABNORMAL HIGH (ref 11.5–15.5)
WBC: 24.6 10*3/uL — AB (ref 4.0–10.5)

## 2014-01-20 LAB — CULTURE, BLOOD (ROUTINE X 2)
CULTURE: NO GROWTH
Culture: NO GROWTH

## 2014-01-20 LAB — HEPATITIS B SURFACE ANTIGEN: Hepatitis B Surface Ag: NEGATIVE

## 2014-01-20 LAB — HEPATITIS B CORE ANTIBODY, TOTAL: HEP B C TOTAL AB: NONREACTIVE

## 2014-01-20 LAB — MAGNESIUM: MAGNESIUM: 3.2 mg/dL — AB (ref 1.5–2.5)

## 2014-01-20 LAB — CK: Total CK: 68 U/L (ref 7–232)

## 2014-01-20 LAB — HEPATITIS B SURFACE ANTIBODY,QUALITATIVE: HEP B S AB: NEGATIVE

## 2014-01-20 MED ORDER — PIPERACILLIN-TAZOBACTAM IN DEX 2-0.25 GM/50ML IV SOLN
2.2500 g | Freq: Three times a day (TID) | INTRAVENOUS | Status: AC
  Administered 2014-01-20 (×2): 2.25 g via INTRAVENOUS
  Filled 2014-01-20 (×2): qty 50

## 2014-01-20 MED ORDER — HEPARIN SODIUM (PORCINE) 1000 UNIT/ML DIALYSIS
20.0000 [IU]/kg | INTRAMUSCULAR | Status: DC | PRN
  Filled 2014-01-20: qty 2

## 2014-01-20 MED ORDER — SODIUM CHLORIDE 0.9 % IV SOLN: 100.0000 mL | INTRAVENOUS | Status: DC | PRN

## 2014-01-20 MED ORDER — HEPARIN SODIUM (PORCINE) 1000 UNIT/ML DIALYSIS
1000.0000 [IU] | INTRAMUSCULAR | Status: DC | PRN
  Filled 2014-01-20: qty 1

## 2014-01-20 MED ORDER — NEPRO/CARBSTEADY PO LIQD: 237.0000 mL | ORAL | Status: DC | PRN

## 2014-01-20 MED ORDER — DEXTROSE-NACL 5-0.45 % IV SOLN
INTRAVENOUS | Status: DC
  Administered 2014-01-20: 500 mL via INTRAVENOUS

## 2014-01-20 MED ORDER — PNEUMOCOCCAL VAC POLYVALENT 25 MCG/0.5ML IJ INJ
0.5000 mL | INJECTION | INTRAMUSCULAR | Status: AC
  Administered 2014-01-21: 0.5 mL via INTRAMUSCULAR
  Filled 2014-01-20: qty 0.5

## 2014-01-20 MED ORDER — ALTEPLASE 2 MG IJ SOLR: 2.0000 mg | Freq: Once | INTRAMUSCULAR | Status: AC | PRN

## 2014-01-20 NOTE — Progress Notes (Signed)
Admit: 01/13/2014 LOS: 7  27M w/ AKI (? BL SCr) 2/2 NSAIDs, ACEi, vol depletion w/ aspiration PNA and Septic Shock, VDRF and ARDS  Subjective:  Off CRRT 01/19/14 Extubated to FM 55% Some AMS, awake and conversant but hard to understand today No UOP Rapid inc in SCr Lytes stable   06/02 0701 - 06/03 0700 In: 390 [I.V.:240; IV Piggyback:150] Out: 910   Filed Weights   01/18/14 0400 01/19/14 0400 01/20/14 0500  Weight: 88 kg (194 lb 0.1 oz) 85 kg (187 lb 6.3 oz) 84.1 kg (185 lb 6.5 oz)    Current meds: reviewed  Current Labs: reviewed    Physical Exam:  Blood pressure 145/76, pulse 101, temperature 98.4 F (36.9 C), temperature source Oral, resp. rate 25, height 5\' 11"  (1.803 m), weight 84.1 kg (185 lb 6.5 oz), SpO2 97.00%. NAD,  NCAT, FM on EOMI Tachy IRIR Coarse bs throughout S/nt/nd, no SP fullness No LEE NO rashes/lesions Confused  Assessment 1. [name] 2/2 ATN related to NSAID, ACEi, hypovolemia, PNA, Septic shock: CRRT 5/28-6/2.  No evidence of renal recovery.  iHD today for UF and solute clearance.   2. Anemia: Hb > 8, no ESA given AKI 3. PNA on zosyn 4. AMS/Delierium per CCM  Pearson Grippe MD 01/20/2014, 8:14 AM   Recent Labs Lab 01/18/14 1600 01/19/14 0445 01/20/14 0500  NA 140 138 141  K 3.5* 3.9 4.1  CL 101 99 101  CO2 26 25 22   GLUCOSE 99 134* 132*  BUN 38* 29* 65*  CREATININE 1.68* 1.61* 4.08*  CALCIUM 8.2* 8.5 8.8  PHOS 1.3* 2.3 4.7*    Recent Labs Lab 01/13/14 1949 01/13/14 2125  01/18/14 0445 01/19/14 0445 01/20/14 0500  WBC 6.4  20.1* 19.2*  < > 19.9* 24.1* 24.6*  NEUTROABS 3.9  15.1* 14.4*  --   --   --   --   HGB 13.8  11.7* 10.8*  < > 7.8* 8.5* 8.2*  HCT 42.1  34.8* 32.7*  < > 24.0* 25.7* 24.6*  MCV 101.4*  85.7 85.4  < > 87.9 87.4 87.2  PLT 131*  32* 26*  < > 96* 165 212  < > = values in this interval not displayed.

## 2014-01-20 NOTE — Evaluation (Signed)
Clinical/Bedside Swallow Evaluation Patient Details  Name: [name] MRN: [id number] Date of Birth: 15-Apr-1941  Today's Date: 01/20/2014 Time: 0910-0920 SLP Time Calculation (min): 10 min  Past Medical History:  Past Medical History  Diagnosis Date  . Diabetes mellitus without complication   . Hypertension   . Hypercholesteremia    Past Surgical History:  Past Surgical History  Procedure Laterality Date  . Prostatectomy    . Colon resection     HPI:  73 year old male with HTN and DM presented 5/27 to ED c/o N/V since 5/25. In ED found to be in acute renal failure, hyperkalemic, hypotensive with SBP's in 70s. Started on BiPAP for hypoxia and increased WOB. Intubated from 5/28 to 6/1. Pt with ICU delirium. Admission Chest CXR says New confluent bibasilar pulmonary opacity, nonspecific but consider bilateral pneumonia or aspiration.    Assessment / Plan / Recommendation Clinical Impression  Pt demonstrates immediate evidence of aspiration with thin and puree textures. Suspect significant weakness given subjectively decreased hyolaryngeal movement, multiple swallows and puree residuals suctioned from oropharynx. Pt would not tolerate any POs at this time due to weakness, impaired respiratory function. Recommend pt remain NPO with short term method of alternate nutrition and PO trials to test for readiness for objective testing. Of note, spoke to pts wife who reported no significant dysphagia pta, only slight difficulty swallowing pills.     Aspiration Risk  Severe    Diet Recommendation NPO;Alternative means - temporary   Medication Administration: Via alternative means    Other  Recommendations Oral Care Recommendations: Oral care Q4 per protocol   Follow Up Recommendations  Skilled Nursing facility    Frequency and Duration min 2x/week  2 weeks   Pertinent Vitals/Pain NA    SLP Swallow Goals     Swallow Study Prior Functional Status       General HPI: 73 year old  male with HTN and DM presented 5/27 to ED c/o N/V since 5/25. In ED found to be in acute renal failure, hyperkalemic, hypotensive with SBP's in 70s. Started on BiPAP for hypoxia and increased WOB. Intubated from 5/28 to 6/1. Pt with ICU delirium. Admission Chest CXR says New confluent bibasilar pulmonary opacity, nonspecific but consider bilateral pneumonia or aspiration.  Type of Study: Bedside swallow evaluation Previous Swallow Assessment: none in chart Diet Prior to this Study: NPO Temperature Spikes Noted: No Respiratory Status: venti-mask History of Recent Intubation: Yes Length of Intubations (days): 5 days Date extubated: 01/18/14 Behavior/Cognition: Alert;Cooperative;Requires cueing;Distractible Oral Cavity - Dentition: Dentures, top;Dentures, bottom;Edentulous (dentures loose, removed for assessment) Self-Feeding Abilities: Total assist Patient Positioning: Upright in bed Baseline Vocal Quality: Low vocal intensity Volitional Cough: Strong Volitional Swallow: Able to elicit    Oral/Motor/Sensory Function Overall Oral Motor/Sensory Function: Impaired (generalized weakness)   Ice Chips Ice chips: Impaired Presentation: Spoon Pharyngeal Phase Impairments: Suspected delayed Swallow;Decreased hyoid-laryngeal movement   Thin Liquid Thin Liquid: Impaired Presentation: Cup Oral Phase Impairments: Reduced labial seal Pharyngeal  Phase Impairments: Suspected delayed Swallow;Decreased hyoid-laryngeal movement;Multiple swallows;Wet Vocal Quality;Cough - Immediate;Throat Clearing - Immediate    Nectar Thick Nectar Thick Liquid: Not tested   Honey Thick Honey Thick Liquid: Not tested   Puree Puree: Impaired Presentation: Spoon Pharyngeal Phase Impairments: Suspected delayed Swallow;Decreased hyoid-laryngeal movement;Multiple swallows;Wet Vocal Quality;Cough - Immediate;Throat Clearing - Immediate   Solid   GO    Solid: Not tested      Herbie [place], MA CCC-SLP [place] [facility] 01/20/2014,9:33 AM

## 2014-01-20 NOTE — Progress Notes (Signed)
NUTRITION FOLLOW UP  Intervention:    If unable to safely advance PO diet, recommend place NGT and initiate TF with 9M PEPuP Protocol: Nepro via NGT at 25 ml/h on day 1; on day 2, increase to goal rate of 55 ml/h (1320 ml per day) to provide 2376 kcals, 107 gm protein, 960 ml free water daily.  Nutrition Dx:   Inadequate oral intake related to inability to eat as evidenced by NPO status.   Goal:   Intake to meet >90% of estimated nutrition needs.  Monitor:   Ability to advance PO diet vs need to start TF, labs, weight trend.  Assessment:   73 year old male with HTN and DM presented 5/27 to ED c/o N/V since 5/25. In ED found to be in acute renal failure, hyperkalemic, hypotensive with SBP's in 70s. Started on BiPAP for hypoxia and increased WOB. Required intubation on 5/28.  Patient was extubated on 6/1. S/P swallow evaluation with SLP this morning, patient is not ready for diet advancement at this time. Discussed patient in ICU rounds today. Plans to give patient another day or two to regain strength and swallowing function before placing an NGT tube for nutrition support. To remain NPO for now. S/P HD this morning.  Height: Ht Readings from Last 1 Encounters:  01/14/14 5\' 11"  (1.803 m)    Weight Status:  Trending down with fluid removal. Wt Readings from Last 1 Encounters:  01/20/14 185 lb 6.5 oz (84.1 kg)  01/15/14  204 lb 5.9 oz (92.7 kg)    Body mass index is 25.87 kg/(m^2).   Re-estimated needs:  Kcal: 2100-2500 Protein: 100-118 gm Fluid: 1.2 L  Skin: no wounds  Diet Order: NPO   Intake/Output Summary (Last 24 hours) at 01/20/14 1414 Last data filed at 01/20/14 1300  Gross per 24 hour  Intake    330 ml  Output      0 ml  Net    330 ml    Last BM: 6/3   Labs:   Recent Labs Lab 01/18/14 0445 01/18/14 1600 01/19/14 0445 01/20/14 0500  NA 139 140 138 141  K 3.6* 3.5* 3.9 4.1  CL 101 101 99 101  CO2 25 26 25 22   BUN 38* 38* 29* 65*  CREATININE  1.60* 1.68* 1.61* 4.08*  CALCIUM 7.9* 8.2* 8.5 8.8  MG 2.6*  --  2.6* 3.2*  PHOS 2.0* 1.3* 2.3 4.7*  GLUCOSE 151* 99 134* 132*    CBG (last 3)   Recent Labs  01/20/14 0358 01/20/14 0759 01/20/14 1207  GLUCAP 125* 134* 115*    Scheduled Meds: . antiseptic oral rinse  15 mL Mouth Rinse QID  . aspirin  81 mg Oral Daily  . chlorhexidine  15 mL Mouth Rinse BID  . pantoprazole (PROTONIX) IV  40 mg Intravenous Q24H  . piperacillin-tazobactam (ZOSYN)  IV  2.25 g Intravenous Q8H    Continuous Infusions: . sodium chloride 10 mL/hr at 01/14/14 0015  . dextrose 5 % and 0.45% NaCl      Molli Barrows, RD, LDN, [place] Pager [phone] After Hours Pager [phone]

## 2014-01-20 NOTE — Procedures (Signed)
I was present at this dialysis session. I have reviewed the session itself and made appropriate changes.   Tolerating procedure well.  Wife updated.  Pearson Grippe  MD 01/20/2014, 12:28 PM

## 2014-01-20 NOTE — Progress Notes (Signed)
OT  / PT Cancellation Note  Patient Details Name: [name] MRN: [id number] DOB: [birth year]   Cancelled Treatment:    Reason Eval/Treat Not Completed: Patient at procedure or test/ unavailable (HD in room currently)  Peri Maris Pager: [phone]  01/20/2014, 10:43 AM

## 2014-01-20 NOTE — Progress Notes (Signed)
PULMONARY / CRITICAL CARE MEDICINE   Name: [name] MRN: [id number] DOB: Jan 06, 1941    ADMISSION DATE:  01/13/2014 CONSULTATION DATE:  01/13/2014  REFERRING MD :  EDP PRIMARY SERVICE: PCCM  CHIEF COMPLAINT:  Back pain  BRIEF PATIENT DESCRIPTION: 73 year old male with HTN and DM presented 5/27 to ED c/o N/V since 5/25. In ED found to be in acute renal failure, hyperkalemic, hypotensive with SBP's in 70s. Started on BiPAP for hypoxia and increased WOB. PCCM asked to admit.   SIGNIFICANT EVENTS:  5/27 admitted 5/28 intubated, shock, pressors, CVVH 5/30 Start pressure support trials  6/1  extubated, off pressors 6/2  D/c'd CRRT 6/3 Improving confusion  STUDIES: Echo 5/28 >> EF 65 to 40%, grade 2 diastolic dysfx, mod AS Renal u/s 5/28 >> medical renal disease  LINES / TUBES: ETT 5/28 >> 6/1 Rt radial aline 5/28 >>6/1 Rt IJ CVL 5/28 >>  Lt IJ HD catheter 5/28 >>   CULTURES: Blood 5/27 >>> neg Urine 5/27 >>> neg Sputum 5/27 >>>neg  ANTIBIOTICS:  (ct while on CRRT) Zosyn 5/27 >>> stop after 6/03 PM dose Vancomycin 5/27 >>> 6/02  SUBJECTIVE: Breathing well on 55% venti mask, delirum improving  VITAL SIGNS: Temp:  [97.8 F (36.6 C)-99.5 F (37.5 C)] 98 F (36.7 C) (06/03 0850) Pulse Rate:  [101-117] 101 (06/03 0800) Resp:  [22-52] 25 (06/03 0800) BP: (129-163)/(68-110) 145/76 mmHg (06/03 0800) SpO2:  [95 %-99 %] 97 % (06/03 0800) FiO2 (%):  [55 %] 55 % (06/03 0800) Weight:  [84.1 kg (185 lb 6.5 oz)] 84.1 kg (185 lb 6.5 oz) (06/03 0500) CVP:  [15 mmHg] 15 mmHg INTAKE / OUTPUT: Intake/Output     06/02 0701 - 06/03 0700 06/03 0701 - 06/04 0700   I.V. (mL/kg) 240 (2.9) 10 (0.1)   NG/GT     IV Piggyback 150    Total Intake(mL/kg) 390 (4.6) 10 (0.1)   Other 910    Stool     Total Output 910     Net -520 +10          PHYSICAL EXAMINATION: General:  Ill appearing elderly male Neuro:  AOx2. Follows commands. Weak strength throughout. Agitated at times. HEENT:   PERRL Cardiovascular:  tachy, regular, 2/6 SM Lungs:  Diminished throughout Abdomen:  Soft, non-tender, non-distended Musculoskeletal:  No acute deformity Skin/Lines:  Intact, bilateral IJ central lines in place  LABS:  CBC  Recent Labs Lab 01/18/14 0445 01/19/14 0445 01/20/14 0500  WBC 19.9* 24.1* 24.6*  HGB 7.8* 8.5* 8.2*  HCT 24.0* 25.7* 24.6*  PLT 96* 165 212   BMET  Recent Labs Lab 01/18/14 1600 01/19/14 0445 01/20/14 0500  NA 140 138 141  K 3.5* 3.9 4.1  CL 101 99 101  CO2 26 25 22   BUN 38* 29* 65*  CREATININE 1.68* 1.61* 4.08*  GLUCOSE 99 134* 132*   Electrolytes  Recent Labs Lab 01/18/14 0445 01/18/14 1600 01/19/14 0445 01/20/14 0500  CALCIUM 7.9* 8.2* 8.5 8.8  MG 2.6*  --  2.6* 3.2*  PHOS 2.0* 1.3* 2.3 4.7*    Sepsis Markers  Recent Labs Lab 01/13/14 2137 01/14/14 0007  LATICACIDVEN 8.59* 4.9*  PROCALCITON  --  1.62   ABG  Recent Labs Lab 01/15/14 0453 01/16/14 1231 01/17/14 0624  PHART 7.332* 7.409 7.411  PCO2ART 51.0* 35.9 41.5  PO2ART 115.0* 53.0* 83.0   Liver Enzymes  Recent Labs Lab 01/15/14 0500  01/16/14 0242  01/18/14 0445 01/18/14 1600 01/19/14  0445 01/20/14 0500  AST 38*  --  44*  --  54*  --   --   --   ALT 19  --  20  --  26  --   --   --   ALKPHOS 59  --  67  --  102  --   --   --   BILITOT 0.8  --  0.8  --  0.8  --   --   --   ALBUMIN 2.6*  < > 2.5*  2.5*  < > 2.4* 2.5* 2.6* 2.4*  < > = values in this interval not displayed.  Cardiac Enzymes  Recent Labs Lab 01/13/14 2125 01/14/14 0428 01/14/14 1207  TROPONINI  --  1.44* 0.97*  PROBNP 35530.0*  --   --    Glucose  Recent Labs Lab 01/19/14 1143 01/19/14 1600 01/19/14 1924 01/20/14 0030 01/20/14 0358 01/20/14 0759  GLUCAP 122* 134* 129* 140* 125* 134*    Imaging Dg Chest Port 1 View  01/19/2014   CLINICAL DATA:  Cough  EXAM: PORTABLE CHEST - 1 VIEW  COMPARISON:  01/18/2014  FINDINGS: Cardiac shadow is stable. A right jugular line and  left jugular temporary dialysis catheter are again seen and stable. The endotracheal tube and nasogastric catheter have been removed. The lungs are well aerated bilaterally. Improved aeration is again seen bilaterally with persistent changes in the right lung base. No new focal infiltrate is seen.  IMPRESSION: Improved aeration with some persistent changes in the right base.  Tubes and lines as described.   Electronically Signed   By: Inez Catalina M.D.   On: 01/19/2014 06:53    ASSESSMENT / PLAN:  PULMONARY A: Acute Hypoxemic Respiratory Failure 2nd to PNA, and pulmonary edema - resolving. Extubated 6/1. 6/02: CXR greatly improved with better aeration P:   F/u CXR intermittently Encourage cough and deep breathing  CARDIOVASCULAR A:  Septic shock - resolved NSTEMI likely from demand ischemia. Hx of HTN. P:  Continue tele  Ct PRN IV metoprolol for tachycardia and htn  RENAL A:   Acute renal failure - concern for NSAID induced nephropathy, also takes ACE-i - improving, anuric  P:   Transitioned to intermittent HD 6/03 F/u Bladder scan - 83mls 6/03 Monitor CMP   GASTROINTESTINAL A:   Nutrition. Failed swallow eval P:   Speech therapy c/s >> if no improvement will need panda tube >> defer for now  HEMATOLOGIC A:  Anemia of critical illness and chronic disease. Thrombocytopenia of critical illness. Leukocytosis  P:  Follow CBC SCD's  INFECTIOUS A:   Septic shock 2nd to PNA  P:   D/C'd Vanc 6/02 D/c zosyn 6/03  ENDOCRINE A:   DM without hyperglycemia - takes metformin at home.  P:   Change CBGs to q12  NEUROLOGIC A:  Chronic low back pain, altered mental status 2/2 ICU delirum?  P:   Ct PT/OT  Hold NSAIDs fent PRN RASS goal 0 Frequent re-orientation and promote sleep at night and awake during the day.    Reviewed above, examined and agree.  Failed swallow eval. Bedside HD today. Mental status slowly improving.  PT/OT evaluation delayed d/t HD today. Plan  for LTAC or SNF.  Updated family at bedside.  [name], MD [facility] Pulmonary/Critical Care 01/20/2014, 2:43 PM Pager:  [phone] After 3pm call: [phone]

## 2014-01-20 NOTE — Progress Notes (Signed)
[facility] Acute Rehabilitation [phone] (pager)

## 2014-01-21 ENCOUNTER — Inpatient Hospital Stay (HOSPITAL_COMMUNITY): Payer: Medicare Other

## 2014-01-21 ENCOUNTER — Encounter (HOSPITAL_COMMUNITY): Payer: Self-pay | Admitting: *Deleted

## 2014-01-21 LAB — GLUCOSE, CAPILLARY
Glucose-Capillary: 124 mg/dL — ABNORMAL HIGH (ref 70–99)
Glucose-Capillary: 127 mg/dL — ABNORMAL HIGH (ref 70–99)
Glucose-Capillary: 129 mg/dL — ABNORMAL HIGH (ref 70–99)
Glucose-Capillary: 132 mg/dL — ABNORMAL HIGH (ref 70–99)
Glucose-Capillary: 137 mg/dL — ABNORMAL HIGH (ref 70–99)
Glucose-Capillary: 140 mg/dL — ABNORMAL HIGH (ref 70–99)
Glucose-Capillary: 158 mg/dL — ABNORMAL HIGH (ref 70–99)

## 2014-01-21 LAB — COMPREHENSIVE METABOLIC PANEL WITH GFR
ALT: 39 U/L (ref 0–53)
AST: 52 U/L — ABNORMAL HIGH (ref 0–37)
Albumin: 2.6 g/dL — ABNORMAL LOW (ref 3.5–5.2)
Alkaline Phosphatase: 92 U/L (ref 39–117)
BUN: 65 mg/dL — ABNORMAL HIGH (ref 6–23)
CO2: 22 meq/L (ref 19–32)
Calcium: 8.5 mg/dL (ref 8.4–10.5)
Chloride: 99 meq/L (ref 96–112)
Creatinine, Ser: 4.68 mg/dL — ABNORMAL HIGH (ref 0.50–1.35)
GFR calc Af Amer: 13 mL/min — ABNORMAL LOW
GFR calc non Af Amer: 11 mL/min — ABNORMAL LOW
Glucose, Bld: 131 mg/dL — ABNORMAL HIGH (ref 70–99)
Potassium: 3.8 meq/L (ref 3.7–5.3)
Sodium: 140 meq/L (ref 137–147)
Total Bilirubin: 0.6 mg/dL (ref 0.3–1.2)
Total Protein: 6.8 g/dL (ref 6.0–8.3)

## 2014-01-21 LAB — CBC
HCT: 27.5 % — ABNORMAL LOW (ref 39.0–52.0)
Hemoglobin: 9 g/dL — ABNORMAL LOW (ref 13.0–17.0)
MCH: 29.1 pg (ref 26.0–34.0)
MCHC: 32.7 g/dL (ref 30.0–36.0)
MCV: 89 fL (ref 78.0–100.0)
Platelets: 291 10*3/uL (ref 150–400)
RBC: 3.09 MIL/uL — ABNORMAL LOW (ref 4.22–5.81)
RDW: 19.4 % — ABNORMAL HIGH (ref 11.5–15.5)
WBC: 20.6 10*3/uL — ABNORMAL HIGH (ref 4.0–10.5)

## 2014-01-21 LAB — PHOSPHORUS: PHOSPHORUS: 7.1 mg/dL — AB (ref 2.3–4.6)

## 2014-01-21 MED ORDER — NEPRO/CARBSTEADY PO LIQD
1000.0000 mL | ORAL | Status: DC
  Administered 2014-01-21 – 2014-01-25 (×6): 1000 mL
  Administered 2014-01-26: 237 mL
  Administered 2014-01-27: 1000 mL
  Filled 2014-01-21 (×7): qty 1000

## 2014-01-21 MED ORDER — NEPRO/CARBSTEADY PO LIQD
1000.0000 mL | ORAL | Status: DC
  Filled 2014-01-21 (×2): qty 1000

## 2014-01-21 MED ORDER — INSULIN ASPART 100 UNIT/ML ~~LOC~~ SOLN
0.0000 [IU] | SUBCUTANEOUS | Status: DC
  Administered 2014-01-21 (×2): 2 [IU] via SUBCUTANEOUS
  Administered 2014-01-22: 4 [IU] via SUBCUTANEOUS
  Administered 2014-01-22: 2 [IU] via SUBCUTANEOUS
  Administered 2014-01-22: 4 [IU] via SUBCUTANEOUS
  Administered 2014-01-22 – 2014-01-26 (×8): 2 [IU] via SUBCUTANEOUS
  Administered 2014-01-26: 4 [IU] via SUBCUTANEOUS
  Administered 2014-01-26 – 2014-01-27 (×4): 2 [IU] via SUBCUTANEOUS
  Administered 2014-01-27: 4 [IU] via SUBCUTANEOUS
  Administered 2014-01-28: 2 [IU] via SUBCUTANEOUS
  Administered 2014-01-28: 4 [IU] via SUBCUTANEOUS
  Administered 2014-01-29 (×2): 2 [IU] via SUBCUTANEOUS

## 2014-01-21 NOTE — Progress Notes (Addendum)
Speech Language Pathology Treatment: Dysphagia  Patient Details Name: [name] MRN: [id number] DOB: [birth year] Today's Date: 01/21/2014 Time: 2831-5176 SLP Time Calculation (min): 15 min  Assessment / Plan / Recommendation Clinical Impression  Pt demonstrates significant improvement today with trials of thin and nectar. Strength and respiratory function are improved, signs of aspiration still present but decreased in severity. Recommend MBS to objectively evaluate pt for potential PO diet.    HPI HPI: 73 year old male with HTN and DM presented 5/27 to ED c/o N/V since 5/25. In ED found to be in acute renal failure, hyperkalemic, hypotensive with SBP's in 70s. Started on BiPAP for hypoxia and increased WOB. Intubated from 5/28 to 6/1. Pt with ICU delirium. Admission Chest CXR says New confluent bibasilar pulmonary opacity, nonspecific but consider bilateral pneumonia or aspiration.    Pertinent Vitals NA  SLP Plan  MBS    Recommendations Diet recommendations: NPO              Oral Care Recommendations: Oral care Q4 per protocol Follow up Recommendations: Skilled Nursing facility Plan: [place] Seriyah Collison, [place] CCC-SLP Varnell Radwan Cowley 01/21/2014, 9:01 AM

## 2014-01-21 NOTE — Progress Notes (Signed)
Admit: 01/13/2014 LOS: 8  63M w/ AKI (? BL SCr) 2/2 NSAIDs, ACEi, vol depletion w/ aspiration PNA and Septic Shock, VDRF and ARDS  Subjective:  HD yesterday, 1.6L UF Little change in BUN/SCr this AM No measured UOP Down to [place] Off ABX Delirium lifting  06/03 0701 - 06/04 0700 In: 596 [I.V.:496; IV Piggyback:100] Out: 1600   Filed Weights   01/19/14 0400 01/20/14 0500 01/21/14 0600  Weight: 85 kg (187 lb 6.3 oz) 84.1 kg (185 lb 6.5 oz) 80.2 kg (176 lb 12.9 oz)    Current meds: reviewed  Current Labs: reviewed    Physical Exam:  Blood pressure 125/74, pulse 88, temperature 99.1 F (37.3 C), temperature source Oral, resp. rate 19, height 5\' 11"  (1.803 m), weight 80.2 kg (176 lb 12.9 oz), SpO2 97.00%. NAD,  NCAT, [place] EOMI IRIR Coarse bs throughout S/nt/nd, no SP fullness No LEE NO rashes/lesions Confused  Assessment 1. [name] 2/2 ATN related to NSAID, ACEi, hypovolemia, PNA, Septic shock: CRRT 5/28-6/2.  No evidence of renal recovery. iHD 6/3, Volume and K/HCO3 stable.  Next HD tentative 6/5 but monitor for recovery daily.   2. Anemia: Hb > 8, no ESA given AKI 3. PNA s/p Zosyn 4. AMS/Delierium per CCM, improving  Pearson Grippe MD 01/21/2014, 7:43 AM   Recent Labs Lab 01/19/14 0445 01/20/14 0500 01/21/14 0420  NA 138 141 140  K 3.9 4.1 3.8  CL 99 101 99  CO2 25 22 22   GLUCOSE 134* 132* 131*  BUN 29* 65* 65*  CREATININE 1.61* 4.08* 4.68*  CALCIUM 8.5 8.8 8.5  PHOS 2.3 4.7* 7.1*    Recent Labs Lab 01/19/14 0445 01/20/14 0500 01/21/14 0420  WBC 24.1* 24.6* 20.6*  HGB 8.5* 8.2* 9.0*  HCT 25.7* 24.6* 27.5*  MCV 87.4 87.2 89.0  PLT 165 212 291

## 2014-01-21 NOTE — Procedures (Signed)
Objective Swallowing Evaluation: Modified Barium Swallowing Study  Patient Details  Name: [name] MRN: [id number] Date of Birth: 08/19/1941  Today's Date: 01/21/2014 Time: 0930-1000 SLP Time Calculation (min): 30 min  Past Medical History:  Past Medical History  Diagnosis Date  . Diabetes mellitus without complication   . Hypertension   . Hypercholesteremia    Past Surgical History:  Past Surgical History  Procedure Laterality Date  . Prostatectomy    . Colon resection     HPI:  73 year old male with HTN and DM presented 5/27 to ED c/o N/V since 5/25. In ED found to be in acute renal failure, hyperkalemic, hypotensive with SBP's in 70s. Started on BiPAP for hypoxia and increased WOB. Intubated from 5/28 to 6/1. Pt with ICU delirium. Admission Chest CXR says New confluent bibasilar pulmonary opacity, nonspecific but consider bilateral pneumonia or aspiration.      Assessment / Plan / Recommendation Clinical Impression  Dysphagia Diagnosis: Moderate cervical esophageal phase dysphagia;Severe pharyngeal phase dysphagia;Mild oral phase dysphagia Clinical impression: Pt demonstrates a primary structural cervical esophageal dysphagia, present at baseline, that is likely worsed by pts mental status and generalized weakness. Pt was awake, but required max cues to keep head up, bring cup to lips and orally accept POs. Oral phase characterized by slow transit. Pt noted to have the appearance of bony protrusion at C5/6 and a prominent cricopharyngeus (no radiologist present to confirm) as well as reduced laryngeal elevation and hyoid excursion. These deficits result in incomplete airway closure with silent aspiration during the swallow and residuals over the CP segment and in the valleculae that are repentrated after the swallow even if pt cued to clear his throat. Findings consistent with possible admission with aspiration pna. Pt would be at high risk of aspiration at this time and would  benefit from several days of recovery prior to retesting and attempting POs. Improved mental status and general strength would be indicators for retest. Recommend NPO with short term alternate nutrition. SLP will revisit pt on Monday for possiblity of retest.     Treatment Recommendation  Therapy as outlined in treatment plan below    Diet Recommendation NPO;Alternative means - temporary   Medication Administration: Via alternative means    Other  Recommendations Oral Care Recommendations: Oral care Q4 per protocol   Follow Up Recommendations  Skilled Nursing facility    Frequency and Duration min 3x week  2 weeks   Pertinent Vitals/Pain NA    SLP Swallow Goals     General HPI: 72 year old male with HTN and DM presented 5/27 to ED c/o N/V since 5/25. In ED found to be in acute renal failure, hyperkalemic, hypotensive with SBP's in 70s. Started on BiPAP for hypoxia and increased WOB. Intubated from 5/28 to 6/1. Pt with ICU delirium. Admission Chest CXR says New confluent bibasilar pulmonary opacity, nonspecific but consider bilateral pneumonia or aspiration.  Type of Study: Modified Barium Swallowing Study Reason for Referral: Objectively evaluate swallowing function Previous Swallow Assessment: none in chart Diet Prior to this Study: NPO Temperature Spikes Noted: No Respiratory Status: Nasal cannula History of Recent Intubation: Yes Length of Intubations (days): 5 days Date extubated: 01/18/14 Behavior/Cognition: Alert;Cooperative;Requires cueing;Distractible Oral Cavity - Dentition: Dentures, top;Dentures, bottom Oral Motor / Sensory Function: Impaired - see Bedside swallow eval Self-Feeding Abilities: Able to feed self;Needs assist Patient Positioning: Upright in chair Baseline Vocal Quality: Low vocal intensity;Wet Volitional Cough: Strong Volitional Swallow: Able to elicit Anatomy:  (bony protrustion  at C5/6, Prominent CP, no radiologist pres.) Pharyngeal Secretions:   (suspect standing secretions)    Reason for Referral Objectively evaluate swallowing function   Oral Phase Oral Preparation/Oral Phase Oral Phase: Impaired Oral - Honey Oral - Honey Teaspoon: Delayed oral transit Oral - Nectar Oral - Nectar Teaspoon: Delayed oral transit Oral - Nectar Cup: Delayed oral transit Oral - Thin Oral - Thin Teaspoon: Right anterior bolus loss;Left anterior bolus loss;Delayed oral transit Oral - Thin Straw: Delayed oral transit (pt struggles to tip head and cup back for thin cup) Oral - Solids Oral - Puree: Within functional limits   Pharyngeal Phase Pharyngeal Phase Pharyngeal Phase: Impaired Pharyngeal - Honey Pharyngeal - Honey Teaspoon: Reduced anterior laryngeal mobility;Reduced epiglottic inversion;Reduced laryngeal elevation;Reduced airway/laryngeal closure;Reduced tongue base retraction;Penetration/Aspiration after swallow;Penetration/Aspiration during swallow;Trace aspiration;Pharyngeal residue - valleculae;Pharyngeal residue - cp segment;Pharyngeal residue - pyriform sinuses;Compensatory strategies attempted (Comment) (head turn left - not effective) Penetration/Aspiration details (honey teaspoon): Material enters airway, CONTACTS cords and not ejected out;Material enters airway, passes BELOW cords without attempt by patient to eject out (silent aspiration) Pharyngeal - Nectar Pharyngeal - Nectar Teaspoon: Reduced anterior laryngeal mobility;Reduced epiglottic inversion;Reduced laryngeal elevation;Reduced airway/laryngeal closure;Reduced tongue base retraction;Penetration/Aspiration after swallow;Penetration/Aspiration during swallow;Trace aspiration;Pharyngeal residue - valleculae;Pharyngeal residue - cp segment;Pharyngeal residue - pyriform sinuses;Compensatory strategies attempted (Comment) Penetration/Aspiration details (nectar teaspoon): Material enters airway, CONTACTS cords and not ejected out;Material enters airway, passes BELOW cords without attempt  by patient to eject out (silent aspiration) Pharyngeal - Nectar Cup: Reduced anterior laryngeal mobility;Reduced epiglottic inversion;Reduced laryngeal elevation;Reduced airway/laryngeal closure;Reduced tongue base retraction;Penetration/Aspiration after swallow;Penetration/Aspiration during swallow;Trace aspiration;Pharyngeal residue - valleculae;Pharyngeal residue - cp segment;Pharyngeal residue - pyriform sinuses;Compensatory strategies attempted (Comment) Penetration/Aspiration details (nectar cup): Material enters airway, CONTACTS cords and not ejected out;Material enters airway, passes BELOW cords without attempt by patient to eject out (silent aspiration) Pharyngeal - Thin Pharyngeal - Thin Teaspoon: Not tested (all spilled from mouth) Pharyngeal - Thin Straw: Reduced anterior laryngeal mobility;Reduced epiglottic inversion;Reduced laryngeal elevation;Reduced airway/laryngeal closure;Reduced tongue base retraction;Penetration/Aspiration during swallow;Pharyngeal residue - valleculae;Pharyngeal residue - cp segment;Pharyngeal residue - pyriform sinuses;Compensatory strategies attempted (Comment);Moderate aspiration Penetration/Aspiration details (thin straw): Material enters airway, passes BELOW cords without attempt by patient to eject out (silent aspiration) Pharyngeal - Solids Pharyngeal - Puree: Reduced anterior laryngeal mobility;Reduced epiglottic inversion;Reduced laryngeal elevation;Reduced airway/laryngeal closure;Reduced tongue base retraction;Pharyngeal residue - valleculae;Pharyngeal residue - cp segment;Pharyngeal residue - pyriform sinuses;Compensatory strategies attempted (Comment) Penetration/Aspiration details (puree): Material does not enter airway  Cervical Esophageal Phase    GO    Cervical Esophageal Phase Cervical Esophageal Phase: Impaired Cervical Esophageal Phase - Comment Cervical Esophageal Comment: see impression statement        Herbie [place], MA CCC-SLP  [phone]  Katherene Ponto Caven Perine 01/21/2014, 10:56 AM

## 2014-01-21 NOTE — Progress Notes (Signed)
PULMONARY / CRITICAL CARE MEDICINE   Name: [name] MRN: [id number] DOB: 1941/01/03    ADMISSION DATE:  01/13/2014 CONSULTATION DATE:  01/13/2014  REFERRING MD :  EDP PRIMARY SERVICE: PCCM  CHIEF COMPLAINT:  Back pain  BRIEF PATIENT DESCRIPTION: 73 year old male with HTN and DM presented 5/27 to ED c/o N/V since 5/25. In ED found to be in acute renal failure, hyperkalemic, hypotensive with SBP's in 70s. Started on BiPAP for hypoxia and increased WOB. PCCM asked to admit.   SIGNIFICANT EVENTS:  5/27 admitted 5/28 intubated, shock, pressors, CVVH 5/30 Start pressure support trials  6/1  extubated, off pressors 6/2  D/c'd CRRT 6/3 Improving confusion 6/4 Resolved confusion  STUDIES: Echo 5/28 >> EF 65 to 62%, grade 2 diastolic dysfx, mod AS Renal u/s 5/28 >> medical renal disease  LINES / TUBES: ETT 5/28 >> 6/1 Rt radial aline 5/28 >>6/1 Rt IJ CVL 5/28 >> 6/3 Lt IJ HD catheter 5/28 >>   CULTURES: Blood 5/27 >>> neg Urine 5/27 >>> neg Sputum 5/27 >>>neg  ANTIBIOTICS:  (ct while on CRRT) Zosyn 5/27 >>> 6/03  Vancomycin 5/27 >>> 6/02  SUBJECTIVE: Breathing well 2L , resolved delirium  VITAL SIGNS: Temp:  [98.3 F (36.8 C)-99.1 F (37.3 C)] 98.3 F (36.8 C) (06/04 0828) Pulse Rate:  [88-118] 92 (06/04 0800) Resp:  [15-35] 20 (06/04 0800) BP: (89-147)/(51-105) 130/70 mmHg (06/04 0800) SpO2:  [95 %-99 %] 98 % (06/04 0800) FiO2 (%):  [45 %-55 %] 45 % (06/03 1800) Weight:  [80.2 kg (176 lb 12.9 oz)] 80.2 kg (176 lb 12.9 oz) (06/04 0600)   INTAKE / OUTPUT: Intake/Output     06/03 0701 - 06/04 0700 06/04 0701 - 06/05 0700   I.V. (mL/kg) 526 (6.6) 30 (0.4)   IV Piggyback 100    Total Intake(mL/kg) 626 (7.8) 30 (0.4)   Other 1600    Total Output 1600     Net -974 +30        Stool Occurrence 2 x      PHYSICAL EXAMINATION: General:  Ill appearing elderly male Neuro:  AOx3. Follows commands. Weak strength throughout. Calm and relaxed affect. HEENT:   PERRL Cardiovascular:  regular, 2/6 SM Lungs:  Diminished throughout Abdomen:  Soft, non-tender, non-distended Musculoskeletal:  No acute deformity Skin/Lines:  Intact, left IJ central line in place  LABS:  CBC  Recent Labs Lab 01/19/14 0445 01/20/14 0500 01/21/14 0420  WBC 24.1* 24.6* 20.6*  HGB 8.5* 8.2* 9.0*  HCT 25.7* 24.6* 27.5*  PLT 165 212 291   BMET  Recent Labs Lab 01/19/14 0445 01/20/14 0500 01/21/14 0420  NA 138 141 140  K 3.9 4.1 3.8  CL 99 101 99  CO2 25 22 22   BUN 29* 65* 65*  CREATININE 1.61* 4.08* 4.68*  GLUCOSE 134* 132* 131*   Electrolytes  Recent Labs Lab 01/18/14 0445  01/19/14 0445 01/20/14 0500 01/21/14 0420  CALCIUM 7.9*  < > 8.5 8.8 8.5  MG 2.6*  --  2.6* 3.2*  --   PHOS 2.0*  < > 2.3 4.7* 7.1*  < > = values in this interval not displayed.  ABG  Recent Labs Lab 01/15/14 0453 01/16/14 1231 01/17/14 0624  PHART 7.332* 7.409 7.411  PCO2ART 51.0* 35.9 41.5  PO2ART 115.0* 53.0* 83.0   Liver Enzymes  Recent Labs Lab 01/16/14 0242  01/18/14 0445  01/19/14 0445 01/20/14 0500 01/21/14 0420  AST 44*  --  54*  --   --   --  52*  ALT 20  --  26  --   --   --  39  ALKPHOS 67  --  102  --   --   --  92  BILITOT 0.8  --  0.8  --   --   --  0.6  ALBUMIN 2.5*  2.5*  < > 2.4*  < > 2.6* 2.4* 2.6*  < > = values in this interval not displayed.  Cardiac Enzymes  Recent Labs Lab 01/14/14 1207  TROPONINI 0.97*   Glucose  Recent Labs Lab 01/20/14 1207 01/20/14 1530 01/20/14 1941 01/20/14 2352 01/21/14 0356 01/21/14 0756  GLUCAP 115* 130* 130* 137* 124* 140*    Imaging No results found.  ASSESSMENT / PLAN:  PULMONARY A: Acute Hypoxemic Respiratory Failure 2nd to PNA, and pulmonary edema - resolved. Extubated 6/1. 6/02: CXR greatly improved with better aeration P:   F/u CXR intermittently Encourage cough and deep breathing  CARDIOVASCULAR A:  Septic shock - resolved NSTEMI likely from demand ischemia. Hx of  HTN. P:  Continue tele  Ct PRN IV metoprolol for tachycardia and htn  RENAL A:   Acute renal failure, Anuric - concern for NSAID induced nephropathy, also takes ACE-i - improving P:   Transitioned to intermittent HD 6/03 Worsening azotemia today -> Renal note 6/4: Next HD ->6/5 F/u Bladder scan  Monitor CMP   GASTROINTESTINAL A:   Nutrition Dysphagia P:   Failed barium swallow 6/4 Started TFs per NGT  HEMATOLOGIC A:  Anemia of critical illness and chronic disease. Thrombocytopenia of critical illness. Leukocytosis  P:  Follow CBC SCD's  INFECTIOUS A:   Septic shock 2nd to PNA  P:   D/C'd Vanc 6/02 D/c zosyn 6/03  ENDOCRINE A:   DM without hyperglycemia - takes metformin at home.  P:   Change CBGs to q4H while on TF Ordered SSI  NEUROLOGIC A:  Chronic low back pain, altered mental status 2/2 ICU delirum?  P:   Ct PT/OT  Hold NSAIDs fent PRN RASS goal 0 Frequent re-orientation and promote sleep at night and awake during the day.    Reviewed above, examined and agree.  Mental status improving today. Failed barium swallow, will start NGT TFs. Next bedside HD for 6/5. Plan for PT/OT evaluation today .  Transfer to SDU.   Will transfer to [facility] 6/05 and PCCM sign off.    [name], MD [facility] Pulmonary/Critical Care 01/21/2014, 9:40 AM Pager:  [phone] After 3pm call: [phone]

## 2014-01-21 NOTE — Evaluation (Signed)
Occupational Therapy Evaluation Patient Details Name: [name] MRN: [id number] DOB: 10/17/1940 Today's Date: 01/21/2014    History of Present Illness 73 year old male with HTN and DM presented 5/27 to ED c/o N/V since 5/25. In ED found to be in acute renal failure, hyperkalemic, hypotensive with SBP's in 70s. Started on BiPAP for hypoxia and increased WOB. intubated 5/28 and extubated 6/1 Pt currently with Hemodialysis Catheter Left side of neck   Clinical Impression   PT admitted with SOB, acute renal failure and HTN. Pt currently with functional limitiations due to the deficits listed below (see OT problem list).  Pt will benefit from skilled OT to increase their independence and safety with adls and balance to allow discharge SNF.     Follow Up Recommendations  SNF    Equipment Recommendations  Wheelchair (measurements OT);Wheelchair cushion (measurements OT);3 in 1 bedside comode    Recommendations for Other Services       Precautions / Restrictions Precautions Precautions: Fall      Mobility Bed Mobility Overal bed mobility: Needs Assistance Bed Mobility: Supine to Sit;Sit to Supine     Supine to sit: Mod assist;HOB elevated Sit to supine: Mod assist   General bed mobility comments: hand over hand cueing to reach for rails. pt progressing biL LE but requires (A) to turn hips  Transfers Overall transfer level: Needs assistance Equipment used: 2 person hand held assist Transfers: Sit to/from Stand Sit to Stand: Mod assist         General transfer comment: Pt first attempt total +2 (A). pt progressed to mod (A) for standing. pt unable to complete full right sit <>Stand posture. question baseline static standing posture    Balance Overall balance assessment: Needs assistance Sitting-balance support: Bilateral upper extremity supported;Feet supported Sitting balance-Leahy Scale: Poor Sitting balance - Comments: pt with posterior lean Postural control:  Posterior lean Standing balance support: Bilateral upper extremity supported;During functional activity Standing balance-Leahy Scale: Poor                              ADL Overall ADL's : Needs assistance/impaired Eating/Feeding: NPO   Grooming: Wash/dry face;Maximal assistance;Sitting (hand over hand) Grooming Details (indicate cue type and reason): needed assit to reach mouth to wipe off white from MBS                               General ADL Comments: Pt incontinent of bowel and unaware. Pt sit<>Stand during session and requires total (A) peri care. pt noted to have blood on pad. Pt provided barrier cream on buttock. Tech notified     Vision                     Perception     Praxis      Pertinent Vitals/Pain HR 122 RR 31     Hand Dominance Right   Extremity/Trunk Assessment Upper Extremity Assessment Upper Extremity Assessment: Generalized weakness   Lower Extremity Assessment Lower Extremity Assessment: Defer to PT evaluation   Cervical / Trunk Assessment Cervical / Trunk Assessment: Kyphotic   Communication Communication Communication: Other (comment) (soft spoken)   Cognition Arousal/Alertness: Awake/alert Behavior During Therapy: Restless Overall Cognitive Status: Impaired/Different from baseline Area of Impairment: Orientation;Attention;Memory;Awareness;Following commands;Safety/judgement;Problem solving Orientation Level: Disoriented to;Place;Time;Situation Current Attention Level: Sustained Memory: Decreased short-term memory Following Commands: Follows one step commands inconsistently Safety/Judgement: Decreased  awareness of deficits Awareness: Intellectual Problem Solving: Slow processing;Decreased initiation;Difficulty sequencing General Comments: Pt reports reason for being at [facility] is "broke both toes each foot". Pt reports month as "january" Pt asking for water and states "the doctor just came in and said I can  have water" Pt currently NPO   General Comments       Exercises       Shoulder Instructions      Home Living   Living Arrangements: Spouse/significant other                               Additional Comments: not family present to obtain information      Prior Functioning/Environment Level of Independence:  (unknown)             OT Diagnosis: Generalized weakness;Cognitive deficits;Acute pain   OT Problem List: Decreased strength;Decreased activity tolerance;Impaired balance (sitting and/or standing);Decreased cognition;Decreased safety awareness;Decreased knowledge of use of DME or AE;Decreased knowledge of precautions;Cardiopulmonary status limiting activity   OT Treatment/Interventions: Self-care/ADL training;Therapeutic exercise;DME and/or AE instruction;Therapeutic activities;Cognitive remediation/compensation;Patient/family education;Other (comment)    OT Goals(Current goals can be found in the care plan section) Acute Rehab OT Goals Patient Stated Goal: to drink some water OT Goal Formulation: Patient unable to participate in goal setting Time For Goal Achievement: 02/04/14 Potential to Achieve Goals: Good  OT Frequency: Min 2X/week   Barriers to D/C:            Co-evaluation PT/OT/SLP Co-Evaluation/Treatment: Yes Reason for Co-Treatment: Complexity of the patient's impairments (multi-system involvement)   OT goals addressed during session: ADL's and self-care      End of Session Equipment Utilized During Treatment: Gait belt Nurse Communication: Mobility status;Precautions  Activity Tolerance: Patient tolerated treatment well Patient left: in bed;with call bell/phone within reach;with chair alarm set   Time: 3662-9476 OT Time Calculation (min): 28 min Charges:  OT General Charges $OT Visit: 1 Procedure OT Evaluation $Initial OT Evaluation Tier I: 1 Procedure OT Treatments $Self Care/Home Management : 8-22 mins G-Codes:    Peri Maris 02-17-2014, 1:43 PM Pager: [phone]

## 2014-01-21 NOTE — Evaluation (Signed)
Physical Therapy Evaluation Patient Details Name: LEVERT HESLOP MRN: [id number] DOB: 1941-01-26 Today's Date: 01/21/2014   History of Present Illness  73 year old male with HTN and DM presented 5/27 to ED c/o N/V since 5/25. In ED found to be in acute renal failure, hyperkalemic, hypotensive with SBP's in 70s. Started on BiPAP for hypoxia and increased WOB. intubated 5/28 and extubated 6/1 Pt currently with Hemodialysis Catheter Left side of neck  Clinical Impression  Pt admitted with above. Pt currently with functional limitations due to the deficits listed below (see PT Problem List). Pt will benefit from skilled PT to increase their independence and safety with mobility to allow discharge to the venue listed below.     Follow Up Recommendations SNF;Supervision/Assistance - 24 hour    Equipment Recommendations  Other (comment) (TBA)    Recommendations for Other Services       Precautions / Restrictions Precautions Precautions: Fall Restrictions Weight Bearing Restrictions: No      Mobility  Bed Mobility Overal bed mobility: Needs Assistance Bed Mobility: Supine to Sit;Sit to Supine     Supine to sit: Mod assist;HOB elevated Sit to supine: Mod assist   General bed mobility comments: hand over hand cueing to reach for rails. pt progressing biL LE but requires (A) to turn hips  Transfers Overall transfer level: Needs assistance Equipment used: 2 person hand held assist Transfers: Sit to/from Stand Sit to Stand: Mod assist         General transfer comment: Pt first attempt total +2 (A). pt progressed to mod (A) for standing. pt unable to complete full right sit <>Stand posture. question baseline static standing posture.  STood 3 attempts due to pt with BM and cleaning pt.  Also put barrier cream on pts bottom.    Ambulation/Gait                Stairs            Wheelchair Mobility    Modified Rankin (Stroke Patients Only)       Balance Overall  balance assessment: Needs assistance Sitting-balance support: Bilateral upper extremity supported;Feet supported Sitting balance-Leahy Scale: Poor Sitting balance - Comments: pt with posterior lean Postural control: Posterior lean Standing balance support: Bilateral upper extremity supported;During functional activity Standing balance-Leahy Scale: Poor Standing balance comment: Has to have UE support and not standing up to full stand with hips, trunk and knees flexed.                               Pertinent Vitals/Pain VSS, No pain    Home Living Family/patient expects to be discharged to:: Private residence Living Arrangements: Spouse/significant other Available Help at Discharge: Family             Additional Comments: not family present to obtain information    Prior Function Level of Independence:  (unknown)               Hand Dominance   Dominant Hand: Right    Extremity/Trunk Assessment   Upper Extremity Assessment: Defer to OT evaluation           Lower Extremity Assessment: Generalized weakness      Cervical / Trunk Assessment: Kyphotic  Communication   Communication: Other (comment) (soft spoken)  Cognition Arousal/Alertness: Awake/alert Behavior During Therapy: Restless Overall Cognitive Status: Impaired/Different from baseline Area of Impairment: Orientation;Attention;Memory;Awareness;Following commands;Safety/judgement;Problem solving Orientation Level: Disoriented to;Place;Time;Situation Current Attention Level:  Sustained Memory: Decreased short-term memory Following Commands: Follows one step commands inconsistently Safety/Judgement: Decreased awareness of deficits Awareness: Intellectual Problem Solving: Slow processing;Decreased initiation;Difficulty sequencing General Comments: Pt reports reason for being at [facility] is "broke both toes each foot". Pt reports month as "january" Pt asking for water and states "the doctor just  came in and said I can have water" Pt currently NPO    General Comments General comments (skin integrity, edema, etc.): breakdown noted on buttock. nursing made aware.    Exercises General Exercises - Lower Extremity Long Arc Quad: AROM;Both;10 reps;Seated      Assessment/Plan    PT Assessment Patient needs continued PT services  PT Diagnosis Generalized weakness   PT Problem List Decreased activity tolerance;Decreased balance;Decreased strength;Decreased range of motion;Decreased mobility;Decreased knowledge of use of DME;Decreased safety awareness;Decreased knowledge of precautions  PT Treatment Interventions DME instruction;Gait training;Functional mobility training;Therapeutic activities;Therapeutic exercise;Balance training;Patient/family education   PT Goals (Current goals can be found in the Care Plan section) Acute Rehab PT Goals Patient Stated Goal: to drink some water PT Goal Formulation: With patient Time For Goal Achievement: 02/04/14 Potential to Achieve Goals: Good    Frequency Min 3X/week   Barriers to discharge        Co-evaluation PT/OT/SLP Co-Evaluation/Treatment: Yes Reason for Co-Treatment: Complexity of the patient's impairments (multi-system involvement) PT goals addressed during session: Mobility/safety with mobility OT goals addressed during session: ADL's and self-care       End of Session Equipment Utilized During Treatment: Gait belt;Oxygen Activity Tolerance: Patient limited by fatigue Patient left: in bed;with call bell/phone within reach Nurse Communication: Mobility status;Need for lift equipment         Time: 2119-4174 PT Time Calculation (min): 20 min   Charges:   PT Evaluation $Initial PT Evaluation Tier I: 1 Procedure PT Treatments $Therapeutic Activity: 8-22 mins   PT G Codes:          Christianne Dolin 2014-02-12, 2:13 PM [facility] Acute Rehabilitation [phone] (pager)

## 2014-01-21 NOTE — Progress Notes (Addendum)
NUTRITION FOLLOW UP / CONSULT  Intervention:    Utilize 36M PEPuP Protocol: initiate Nepro via NGT at 25 ml/h on day 1; on day 2, increase to goal rate of 55 ml/h (1320 ml per day) to provide 2376 kcals, 107 gm protein, 960 ml free water daily.  Nutrition Dx:   Inadequate oral intake related to inability to eat as evidenced by NPO status. Ongoing.  Goal:   Intake to meet >90% of estimated nutrition needs. Unmet.  Monitor:   Ability to advance PO diet, TF tolerance/adequacy, labs, weight trend.  Assessment:   73 year old male with HTN and DM presented 5/27 to ED c/o N/V since 5/25. In ED found to be in acute renal failure, hyperkalemic, hypotensive with SBP's in 70s. Started on BiPAP for hypoxia and increased WOB. Required intubation on 5/28.  Patient was extubated on 6/1. CRRT off since 6/2. S/P multiple swallow evaluations with SLP; S/P MBS this morning, patient is not ready for diet advancement at this time. Plans for swallow re-evaluation on Monday. Discussed patient in ICU rounds today. Plans to place an NGT tube for nutrition support. Still anuric; S/P HD yesterday.  Received MD Consult for TF initiation and management.  Height: Ht Readings from Last 1 Encounters:  01/14/14 5\' 11"  (1.803 m)    Weight Status:  Trending down with fluid removal. Wt Readings from Last 1 Encounters:  01/21/14 176 lb 12.9 oz (80.2 kg)  01/20/14  185 lb 6.5 oz (84.1 kg) 01/15/14  204 lb 5.9 oz (92.7 kg)    Body mass index is 24.67 kg/(m^2).   Re-estimated needs:  Kcal: 2200-2500 Protein: 100-118 gm Fluid: 1.2 L  Skin: WDL  Diet Order: NPO   Intake/Output Summary (Last 24 hours) at 01/21/14 1415 Last data filed at 01/21/14 0800  Gross per 24 hour  Intake    586 ml  Output      0 ml  Net    586 ml    Last BM: 6/3   Labs:   Recent Labs Lab 01/18/14 0445  01/19/14 0445 01/20/14 0500 01/21/14 0420  NA 139  < > 138 141 140  K 3.6*  < > 3.9 4.1 3.8  CL 101  < > 99 101 99   CO2 25  < > 25 22 22   BUN 38*  < > 29* 65* 65*  CREATININE 1.60*  < > 1.61* 4.08* 4.68*  CALCIUM 7.9*  < > 8.5 8.8 8.5  MG 2.6*  --  2.6* 3.2*  --   PHOS 2.0*  < > 2.3 4.7* 7.1*  GLUCOSE 151*  < > 134* 132* 131*  < > = values in this interval not displayed.  CBG (last 3)   Recent Labs  01/21/14 0356 01/21/14 0756 01/21/14 1144  GLUCAP 124* 140* 158*    Scheduled Meds: . antiseptic oral rinse  15 mL Mouth Rinse QID  . aspirin  81 mg Oral Daily  . chlorhexidine  15 mL Mouth Rinse BID  . feeding supplement (NEPRO CARB STEADY)  1,000 mL Per Tube Q24H  . insulin aspart  0-24 Units Subcutaneous 6 times per day  . pantoprazole (PROTONIX) IV  40 mg Intravenous Q24H  . pneumococcal 23 valent vaccine  0.5 mL Intramuscular Tomorrow-1000    Continuous Infusions: . sodium chloride 10 mL/hr at 01/14/14 0015  . dextrose 5 % and 0.45% NaCl 500 mL (01/20/14 1518)    Molli Barrows, RD, LDN, [place] Pager [phone] After Hours Pager [phone]

## 2014-01-22 LAB — CBC
HEMATOCRIT: 29.7 % — AB (ref 39.0–52.0)
HEMOGLOBIN: 9.5 g/dL — AB (ref 13.0–17.0)
MCH: 28.4 pg (ref 26.0–34.0)
MCHC: 32 g/dL (ref 30.0–36.0)
MCV: 88.7 fL (ref 78.0–100.0)
Platelets: 377 10*3/uL (ref 150–400)
RBC: 3.35 MIL/uL — ABNORMAL LOW (ref 4.22–5.81)
RDW: 18.8 % — ABNORMAL HIGH (ref 11.5–15.5)
WBC: 20.6 10*3/uL — ABNORMAL HIGH (ref 4.0–10.5)

## 2014-01-22 LAB — GLUCOSE, CAPILLARY
GLUCOSE-CAPILLARY: 104 mg/dL — AB (ref 70–99)
GLUCOSE-CAPILLARY: 166 mg/dL — AB (ref 70–99)
GLUCOSE-CAPILLARY: 147 mg/dL — AB (ref 70–99)
GLUCOSE-CAPILLARY: 163 mg/dL — AB (ref 70–99)

## 2014-01-22 LAB — BASIC METABOLIC PANEL
BUN: 110 mg/dL — AB (ref 6–23)
CO2: 18 mEq/L — ABNORMAL LOW (ref 19–32)
CREATININE: 7.9 mg/dL — AB (ref 0.50–1.35)
Calcium: 9 mg/dL (ref 8.4–10.5)
Chloride: 96 mEq/L (ref 96–112)
GFR calc Af Amer: 7 mL/min — ABNORMAL LOW (ref >90)
GFR calc non Af Amer: 6 mL/min — ABNORMAL LOW (ref >90)
Glucose, Bld: 124 mg/dL — ABNORMAL HIGH (ref 70–99)
POTASSIUM: 3.9 meq/L (ref 3.7–5.3)
Sodium: 143 mEq/L (ref 137–147)

## 2014-01-22 MED ORDER — BIOTENE DRY MOUTH MT LIQD
15.0000 mL | Freq: Two times a day (BID) | OROMUCOSAL | Status: DC
  Administered 2014-01-23 – 2014-02-11 (×33): 15 mL via OROMUCOSAL

## 2014-01-22 NOTE — Progress Notes (Signed)
Admit: 01/13/2014 LOS: 9  44M w/ AKI (? BL SCr) 2/2 NSAIDs, ACEi, vol depletion w/ aspiration PNA and Septic Shock, VDRF and ARDS  Subjective:  No UOP In Chair, on Room Air BUN and SCr sharp inc in past 24h  06/04 0701 - 06/05 0700 In: 416 [I.V.:60; NG/GT:356] Out: -   Filed Weights   01/20/14 0500 01/21/14 0600 01/22/14 0600  Weight: 84.1 kg (185 lb 6.5 oz) 80.2 kg (176 lb 12.9 oz) 79 kg (174 lb 2.6 oz)    Current meds: reviewed  Current Labs: reviewed    Physical Exam:  Blood pressure 111/64, pulse 95, temperature 98.1 F (36.7 C), temperature source Oral, resp. rate 15, height 5\' 11"  (1.803 m), weight 79 kg (174 lb 2.6 oz), SpO2 99.00%. NAD,  NCAT,  EOMI IRIR Coarse bs throughout S/nt/nd, no SP fullness No LEE NO rashes/lesions Confused  Assessment 1. [name] 2/2 ATN related to NSAID, ACEi, hypovolemia, PNA, Septic shock: CRRT 5/28-6/2.  No evidence of renal recovery to this point. iHD today. 2. Anemia: Hb > 8, no ESA given AKI 3. PNA s/p Zosyn 4. AMS/Delierium per TRH, improving  Pearson Grippe MD 01/22/2014, 12:18 PM   Recent Labs Lab 01/19/14 0445 01/20/14 0500 01/21/14 0420 01/22/14 0708  NA 138 141 140 143  K 3.9 4.1 3.8 3.9  CL 99 101 99 96  CO2 25 22 22  18*  GLUCOSE 134* 132* 131* 124*  BUN 29* 65* 65* 110*  CREATININE 1.61* 4.08* 4.68* 7.90*  CALCIUM 8.5 8.8 8.5 9.0  PHOS 2.3 4.7* 7.1*  --     Recent Labs Lab 01/20/14 0500 01/21/14 0420 01/22/14 0708  WBC 24.6* 20.6* 20.6*  HGB 8.2* 9.0* 9.5*  HCT 24.6* 27.5* 29.7*  MCV 87.2 89.0 88.7  PLT [phone]

## 2014-01-22 NOTE — Progress Notes (Signed)
Admission note:  Arrival Method: bed Mental Orientation: alert and oriented x 4  Telemetry: N/A Assessment: complete  Skin: redness to sacral area IV: waiting on IV team  Pain: pt reports no pain  Tubes: NG in place  Safety Measures: Patient Handbook has been given, and discussed the Fall Prevention worksheet. Left at bedside  Admission: Completed and admission orders have been written  6E Orientation: Patient has been oriented to the unit, staff and to the room.

## 2014-01-22 NOTE — Progress Notes (Signed)
Speech Language Pathology Treatment: Dysphagia  Patient Details Name: [name] MRN: [id number] DOB: 17-Sep-1940 Today's Date: 01/22/2014 Time: 4076-8088 SLP Time Calculation (min): 23 min  Assessment / Plan / Recommendation Clinical Impression  Provided max verbal, visual and tactile cues to lead pt in Mendelsohn maneuver and Masako maneuver for laryngeal elevation and base of tongue strength. Pt mildly successful, confusion still a barrier to these complex exercises. Pt was able to complete effortful swallows with ice and puree. Initially pt with no evidence of aspiration though swallow noticeabley weak. After 2-3 minutes pt with hard coughing, indicative of sensation of aspirate descending the airway. Recommend pt remain NPO for 2 days with ice chips provided by RN after oral care, one at a time. Instructions placed over bed. Will repeat MBS Monday.    HPI HPI: 73 year old male with HTN and DM presented 5/27 to ED c/o N/V since 5/25. In ED found to be in acute renal failure, hyperkalemic, hypotensive with SBP's in 70s. Started on BiPAP for hypoxia and increased WOB. Intubated from 5/28 to 6/1. Pt with ICU delirium. Admission Chest CXR says New confluent bibasilar pulmonary opacity, nonspecific but consider bilateral pneumonia or aspiration.    Pertinent Vitals NA  SLP Plan  Continue with current plan of care    Recommendations Diet recommendations: NPO (pt may have ice chips after oral care - see instructions) Medication Administration: Via alternative means              General recommendations: Rehab consult Oral Care Recommendations: Oral care Q4 per protocol Follow up Recommendations: Inpatient Rehab Plan: Continue with current plan of care    GO    [facility], MA CCC-SLP [phone]  [name] 01/22/2014, 8:48 AM

## 2014-01-22 NOTE — Care Management Note (Signed)
    Page 1 of 1   01/22/2014     1:52:23 PM CARE MANAGEMENT NOTE 01/22/2014  Patient:  [name], [name]   Account Number:  [id number]  Date Initiated:  01/18/2014  Documentation initiated by:  Luz Lex  Subjective/Objective Assessment:   Admtted with shock - resp and renal failure - intubated - CRRT - pressors     Action/Plan:   Anticipated DC Date:  01/25/2014   Anticipated DC Plan:  [place]  CM consult      Choice offered to / List presented to:             Status of service:  In process, will continue to follow Medicare Important Message given?   (If response is "NO", the following Medicare IM given date fields will be blank) Date Medicare IM given:   Date Additional Medicare IM given:  01/22/2014  Discharge Disposition:    Per UR Regulation:  Reviewed for med. necessity/level of care/duration of stay  If discussed at [place] of Stay Meetings, dates discussed:    Comments:  Contact:  Casimir,CONNIE   [phone]  01-22-14 1:40pm Luz Lex, RNBSN [phone] Talked to patient about need for rehab prior to discharge. Agrees to rehab program prior to discharge.  IM signed and placed in shadow chart and copy given.  Plan for tx out to M/S floor.

## 2014-01-22 NOTE — Progress Notes (Signed)
Physical Therapy Treatment Patient Details Name: [name] MRN: [id number] DOB: [birth year] Today's Date: 01/22/2014    History of Present Illness 73 year old male with HTN and DM presented 5/27 to ED c/o N/V since 5/25. In ED found to be in acute renal failure, hyperkalemic, hypotensive with SBP's in 70s. Started on BiPAP for hypoxia and increased WOB. intubated 5/28 and extubated 6/1 Pt currently with Hemodialysis Catheter Left side of neck    PT Comments    Pt admitted with above. Pt currently with functional limitations due to balance and endurance deficits.  Pt will benefit from skilled PT to increase their independence and safety with mobility to allow discharge to the venue listed below.   Follow Up Recommendations  SNF;Supervision/Assistance - 24 hour     Equipment Recommendations  Other (comment) (TBA)    Recommendations for Other Services       Precautions / Restrictions Precautions Precautions: Fall Restrictions Weight Bearing Restrictions: No    Mobility  Bed Mobility Overal bed mobility: Needs Assistance Bed Mobility: Supine to Sit     Supine to sit: Mod assist;HOB elevated     General bed mobility comments: hand over hand cueing to reach for rails. pt progressing biL LE but requires (A) to turn hips  Transfers Overall transfer level: Needs assistance Equipment used: Rolling walker (2 wheeled) Transfers: Sit to/from [facility] Sit to Stand: Mod assist;+2 physical assistance;From elevated surface Stand pivot transfers: Mod assist;+2 physical assistance;From elevated surface       General transfer comment: Pt stood well to RW and was able to stand for 4 minutes to be cleaned of BM.  Then pt pivoted to Recliner using RW with cues for sequencing steps and RW.  Needed cues for upright posture with pt very flexed with hips, knees and trunk flexed.  Could stand taller with cues.    Ambulation/Gait                 Stairs             Wheelchair Mobility    Modified Rankin (Stroke Patients Only)       Balance Overall balance assessment: Needs assistance;History of Falls Sitting-balance support: Bilateral upper extremity supported;Feet supported Sitting balance-Leahy Scale: Poor Sitting balance - Comments: pt with posterior lean Postural control: Posterior lean Standing balance support: Bilateral upper extremity supported;During functional activity Standing balance-Leahy Scale: Poor Standing balance comment: Has to have UE support and not standing fully upright.                     Cognition Arousal/Alertness: Awake/alert Behavior During Therapy: Restless Overall Cognitive Status: Impaired/Different from baseline Area of Impairment: Orientation;Attention;Memory;Awareness;Following commands;Safety/judgement;Problem solving Orientation Level: Disoriented to;Time;Situation Current Attention Level: Sustained Memory: Decreased short-term memory Following Commands: Follows one step commands inconsistently Safety/Judgement: Decreased awareness of deficits Awareness: Intellectual Problem Solving: Slow processing;Decreased initiation;Difficulty sequencing      Exercises      General Comments General comments (skin integrity, edema, etc.): Continues with loose stools and redness on buttocks.  nursing aware       Pertinent Vitals/Pain VSS, no pain    Home Living                      Prior Function            PT Goals (current goals can now be found in the care plan section) Progress towards PT goals: Progressing toward goals  Frequency  Min 3X/week    PT Plan Current plan remains appropriate    Co-evaluation             End of Session Equipment Utilized During Treatment: Gait belt Activity Tolerance: Patient limited by fatigue Patient left: in chair;with call bell/phone within reach;with nursing/sitter in room     Time: 2671-2458 PT Time Calculation (min): 24  min  Charges:  $Therapeutic Activity: 8-22 mins $Self Care/Home Management: 8-22                    G Codes:      Christianne Dolin 01/30/2014, 4:56 PM [facility] Acute Rehabilitation [phone] (pager)

## 2014-01-22 NOTE — Progress Notes (Addendum)
PROGRESS NOTE  [name] HER:[id number] DOB: April 08, 1941 DOA: 01/13/2014 PCP: No primary provider on file.    BRIEF PATIENT DESCRIPTION: 73 year old male with HTN and DM presented 5/27 to ED c/o N/V since 5/25. In ED found to be in acute renal failure, hyperkalemic, hypotensive with SBP's in 70s. Started on BiPAP for hypoxia and increased WOB. PCCM asked to admit.   SIGNIFICANT EVENTS:  5/27 admitted  5/28 intubated, shock, pressors, CVVH  5/30 Start pressure support trials  6/1 extubated, off pressors  6/2 D/c'd CRRT  6/3 Improving confusion   STUDIES:  Echo 5/28 >> EF 65 to 85%, grade 2 diastolic dysfx, mod AS  Renal u/s 5/28 >> medical renal disease     Assessment/Plan:  [name] 2/2 ATN related to NSAID, ACEi, hypovolemia, PNA, Septic shock -CRRT 5/28-6/2.  -Next HD tentative 6/5  -Cr increasing  Anemia: Hb > 8, no ESA given AKI  PNA s/p Zosyn  AMS/Delierium -not oriented to date/president  Leukocytosis  DM -SSI -was on metformin at home  Dysphagia -TF per NGT tube Speech following  Acute Hypoxemic Respiratory Failure 2nd to PNA, and pulmonary edema - resolved. Extubated 6/1. 6/02: CXR  greatly improved with better aeration  Septic shock - resolved   NSTEMI likely from demand ischemia   Code Status: full Family Communication: patient/ called [name]- # under demographics Disposition Plan: SDU   Consultants:  Nephrology  PCCM  PT/OT  SLP      HPI/Subjective: Up in chair Wanting to leave   Objective: Filed Vitals:   01/22/14 0830  BP:   Pulse:   Temp: 98.1 F (36.7 C)  Resp:     Intake/Output Summary (Last 24 hours) at 01/22/14 0831 Last data filed at 01/22/14 0800  Gross per 24 hour  Intake    441 ml  Output      0 ml  Net    441 ml   Filed Weights   01/20/14 0500 01/21/14 0600 01/22/14 0600  Weight: 84.1 kg (185 lb 6.5 oz) 80.2 kg (176 lb 12.9 oz) 79 kg (174 lb 2.6 oz)    Exam:  General:  chronically ill Neuro: not oriented to time/president HEENT: PERRL  Cardiovascular: regular, 2/6 SM  Lungs: Diminished throughout  Abdomen: Soft, non-tender, non-distended  Musculoskeletal: No acute deformity  Skin/Lines: Intact, left IJ central line in place   Data Reviewed: Basic Metabolic Panel:  Recent Labs Lab 01/16/14 0242  01/17/14 0242  01/18/14 0445 01/18/14 1600 01/19/14 0445 01/20/14 0500 01/21/14 0420 01/22/14 0708  NA 138  137  < >  --   < > 139 140 138 141 140 143  K 4.2  4.2  < >  --   < > 3.6* 3.5* 3.9 4.1 3.8 3.9  CL 99  98  < >  --   < > 101 101 99 101 99 96  CO2 24  24  < >  --   < > 25 26 25 22 22  18*  GLUCOSE 135*  134*  < >  --   < > 151* 99 134* 132* 131* 124*  BUN 36*  36*  < >  --   < > 38* 38* 29* 65* 65* PENDING  CREATININE 2.22*  2.24*  < >  --   < > 1.60* 1.68* 1.61* 4.08* 4.68* 7.90*  CALCIUM 7.7*  7.8*  < >  --   < > 7.9* 8.2* 8.5 8.8 8.5 9.0  MG  2.5  --  2.5  --  2.6*  --  2.6* 3.2*  --   --   PHOS 3.0  3.0  < >  --   < > 2.0* 1.3* 2.3 4.7* 7.1*  --   < > = values in this interval not displayed. Liver Function Tests:  Recent Labs Lab 01/16/14 0242  01/18/14 0445 01/18/14 1600 01/19/14 0445 01/20/14 0500 01/21/14 0420  AST 44*  --  54*  --   --   --  52*  ALT 20  --  26  --   --   --  39  ALKPHOS 67  --  102  --   --   --  92  BILITOT 0.8  --  0.8  --   --   --  0.6  PROT 6.1  --  6.2  --   --   --  6.8  ALBUMIN 2.5*  2.5*  < > 2.4* 2.5* 2.6* 2.4* 2.6*  < > = values in this interval not displayed. No results found for this basename: LIPASE, AMYLASE,  in the last 168 hours No results found for this basename: AMMONIA,  in the last 168 hours CBC:  Recent Labs Lab 01/18/14 0445 01/19/14 0445 01/20/14 0500 01/21/14 0420 01/22/14 0708  WBC 19.9* 24.1* 24.6* 20.6* 20.6*  HGB 7.8* 8.5* 8.2* 9.0* 9.5*  HCT 24.0* 25.7* 24.6* 27.5* 29.7*  MCV 87.9 87.4 87.2 89.0 88.7  PLT 96* 165 212 291 377   Cardiac Enzymes:  Recent  Labs Lab 01/20/14 0500  CKTOTAL 68   BNP (last 3 results)  Recent Labs  01/13/14 2125  PROBNP 35530.0*   CBG:  Recent Labs Lab 01/21/14 1607 01/21/14 1948 01/21/14 2353 01/22/14 0412 01/22/14 0813  GLUCAP 129* 132* 127* 104* 166*    Recent Results (from the past 240 hour(s))  CULTURE, BLOOD (ROUTINE X 2)     Status: None   Collection Time    01/13/14  9:40 PM      Result Value Ref Range Status   Specimen Description BLOOD LEFT HAND   Final   Special Requests BOTTLES DRAWN AEROBIC AND ANAEROBIC 5ML   Final   Culture  Setup Time     Final   Value: 01/14/2014 03:54     Performed at [facility]   Culture     Final   Value: NO GROWTH 5 DAYS     Performed at [facility]   Report Status 01/20/2014 FINAL   Final  CULTURE, BLOOD (ROUTINE X 2)     Status: None   Collection Time    01/13/14  9:45 PM      Result Value Ref Range Status   Specimen Description BLOOD RIGHT HAND   Final   Special Requests BOTTLES DRAWN AEROBIC ONLY [facility]   Final   Culture  Setup Time     Final   Value: 01/14/2014 03:54     Performed at [facility]   Culture     Final   Value: NO GROWTH 5 DAYS     Performed at [facility]   Report Status 01/20/2014 FINAL   Final  URINE CULTURE     Status: None   Collection Time    01/13/14  9:46 PM      Result Value Ref Range Status   Specimen Description URINE, CATHETERIZED   Final   Special Requests ADDED [id number]   Final   Culture  Setup Time     Final   Value: 01/14/2014 01:06     Performed at [place]     Final   Value: NO GROWTH     Performed at [facility]   Culture     Final   Value: NO GROWTH     Performed at [facility]   Report Status 01/15/2014 FINAL   Final  CULTURE, RESPIRATORY (NON-EXPECTORATED)     Status: None   Collection Time    01/14/14 12:27 PM      Result Value Ref Range Status   Specimen Description TRACHEAL ASPIRATE   Final   Special Requests  NONE   Final   Gram Stain     Final   Value: MODERATE WBC PRESENT,BOTH PMN AND MONONUCLEAR     NO SQUAMOUS EPITHELIAL CELLS SEEN     NO ORGANISMS SEEN     Performed at [facility]   Culture     Final   Value: NO GROWTH 2 DAYS     Performed at [facility]   Report Status 01/16/2014 FINAL   Final     Studies: Dg Abd Portable 1v  01/21/2014   CLINICAL DATA:  Feeding tube placement.  EXAM: PORTABLE ABDOMEN - 1 VIEW  COMPARISON:  None.  FINDINGS: Weighted tip feeding tube is present in the proximal gastric fundus. Barium is present in small bowel. The bowel gas pattern is nonobstructive. Gaseous distention of the colon.  IMPRESSION: Weighted tip feeding tube in the proximal gastric fundus.   Electronically Signed   By: Dereck Ligas M.D.   On: 01/21/2014 16:32   Dg Swallowing Func-speech Pathology  01/21/2014   Katherene Ponto Deblois, CCC-SLP     01/21/2014 10:58 AM Objective Swallowing Evaluation: Modified Barium Swallowing Study   Patient Details  Name: [name] MRN: [id number] Date of Birth: 1941/07/09  Today's Date: 01/21/2014 Time: 0930-1000 SLP Time Calculation (min): 30 min  Past Medical History:  Past Medical History  Diagnosis Date  . Diabetes mellitus without complication   . Hypertension   . Hypercholesteremia    Past Surgical History:  Past Surgical History  Procedure Laterality Date  . Prostatectomy    . Colon resection     HPI:  73 year old male with HTN and DM presented 5/27 to ED c/o N/V  since 5/25. In ED found to be in acute renal failure,  hyperkalemic, hypotensive with SBP's in 70s. Started on BiPAP for  hypoxia and increased WOB. Intubated from 5/28 to 6/1. Pt with  ICU delirium. Admission Chest CXR says New confluent bibasilar  pulmonary opacity, nonspecific but consider bilateral pneumonia  or aspiration.      Assessment / Plan / Recommendation Clinical Impression  Dysphagia Diagnosis: Moderate cervical esophageal phase  dysphagia;Severe pharyngeal phase  dysphagia;Mild oral phase  dysphagia Clinical impression: Pt demonstrates a primary structural  cervical esophageal dysphagia, present at baseline, that is  likely worsed by pts mental status and generalized weakness. Pt  was awake, but required max cues to keep head up, bring cup to  lips and orally accept POs. Oral phase characterized by slow  transit. Pt noted to have the appearance of bony protrusion at  C5/6 and a prominent cricopharyngeus (no radiologist present to  confirm) as well as reduced laryngeal elevation and hyoid  excursion. These deficits result in incomplete airway closure  with silent aspiration during the swallow and residuals over the  CP  segment and in the valleculae that are repentrated after the  swallow even if pt cued to clear his throat. Findings consistent  with possible admission with aspiration pna. Pt would be at high  risk of aspiration at this time and would benefit from several  days of recovery prior to retesting and attempting POs. Improved  mental status and general strength would be indicators for  retest. Recommend NPO with short term alternate nutrition. SLP  will revisit pt on Monday for possiblity of retest.     Treatment Recommendation  Therapy as outlined in treatment plan below    Diet Recommendation NPO;Alternative means - temporary   Medication Administration: Via alternative means    Other  Recommendations Oral Care Recommendations: Oral care Q4  per protocol   Follow Up Recommendations  Skilled Nursing facility    Frequency and Duration min 3x week  2 weeks   Pertinent Vitals/Pain NA    SLP Swallow Goals     General HPI: 73 year old male with HTN and DM presented 5/27 to  ED c/o N/V since 5/25. In ED found to be in acute renal failure,  hyperkalemic, hypotensive with SBP's in 70s. Started on BiPAP for  hypoxia and increased WOB. Intubated from 5/28 to 6/1. Pt with  ICU delirium. Admission Chest CXR says New confluent bibasilar  pulmonary opacity, nonspecific but  consider bilateral pneumonia  or aspiration.  Type of Study: Modified Barium Swallowing Study Reason for Referral: Objectively evaluate swallowing function Previous Swallow Assessment: none in chart Diet Prior to this Study: NPO Temperature Spikes Noted: No Respiratory Status: Nasal cannula History of Recent Intubation: Yes Length of Intubations (days): 5 days Date extubated: 01/18/14 Behavior/Cognition: Alert;Cooperative;Requires  cueing;Distractible Oral Cavity - Dentition: Dentures, top;Dentures, bottom Oral Motor / Sensory Function: Impaired - see Bedside swallow  eval Self-Feeding Abilities: Able to feed self;Needs assist Patient Positioning: Upright in chair Baseline Vocal Quality: Low vocal intensity;Wet Volitional Cough: Strong Volitional Swallow: Able to elicit Anatomy:  (bony protrustion at C5/6, Prominent CP, no radiologist  pres.) Pharyngeal Secretions:  (suspect standing secretions)    Reason for Referral Objectively evaluate swallowing function   Oral Phase Oral Preparation/Oral Phase Oral Phase: Impaired Oral - Honey Oral - Honey Teaspoon: Delayed oral transit Oral - Nectar Oral - Nectar Teaspoon: Delayed oral transit Oral - Nectar Cup: Delayed oral transit Oral - Thin Oral - Thin Teaspoon: Right anterior bolus loss;Left anterior  bolus loss;Delayed oral transit Oral - Thin Straw: Delayed oral transit (pt struggles to tip head  and cup back for thin cup) Oral - Solids Oral - Puree: Within functional limits   Pharyngeal Phase Pharyngeal Phase Pharyngeal Phase: Impaired Pharyngeal - Honey Pharyngeal - Honey Teaspoon: Reduced anterior laryngeal  mobility;Reduced epiglottic inversion;Reduced laryngeal  elevation;Reduced airway/laryngeal closure;Reduced tongue base  retraction;Penetration/Aspiration after  swallow;Penetration/Aspiration during swallow;Trace  aspiration;Pharyngeal residue - valleculae;Pharyngeal residue -  cp segment;Pharyngeal residue - pyriform sinuses;Compensatory  strategies attempted  (Comment) (head turn left - not effective) Penetration/Aspiration details (honey teaspoon): Material enters  airway, CONTACTS cords and not ejected out;Material enters  airway, passes BELOW cords without attempt by patient to eject  out (silent aspiration) Pharyngeal - Nectar Pharyngeal - Nectar Teaspoon: Reduced anterior laryngeal  mobility;Reduced epiglottic inversion;Reduced laryngeal  elevation;Reduced airway/laryngeal closure;Reduced tongue base  retraction;Penetration/Aspiration after  swallow;Penetration/Aspiration during swallow;Trace  aspiration;Pharyngeal residue - valleculae;Pharyngeal residue -  cp segment;Pharyngeal residue - pyriform sinuses;Compensatory  strategies attempted (Comment) Penetration/Aspiration details (nectar teaspoon): Material enters  airway, CONTACTS cords and not ejected  out;Material enters  airway, passes BELOW cords without attempt by patient to eject  out (silent aspiration) Pharyngeal - Nectar Cup: Reduced anterior laryngeal  mobility;Reduced epiglottic inversion;Reduced laryngeal  elevation;Reduced airway/laryngeal closure;Reduced tongue base  retraction;Penetration/Aspiration after  swallow;Penetration/Aspiration during swallow;Trace  aspiration;Pharyngeal residue - valleculae;Pharyngeal residue -  cp segment;Pharyngeal residue - pyriform sinuses;Compensatory  strategies attempted (Comment) Penetration/Aspiration details (nectar cup): Material enters  airway, CONTACTS cords and not ejected out;Material enters  airway, passes BELOW cords without attempt by patient to eject  out (silent aspiration) Pharyngeal - Thin Pharyngeal - Thin Teaspoon: Not tested (all spilled from mouth) Pharyngeal - Thin Straw: Reduced anterior laryngeal  mobility;Reduced epiglottic inversion;Reduced laryngeal  elevation;Reduced airway/laryngeal closure;Reduced tongue base  retraction;Penetration/Aspiration during swallow;Pharyngeal  residue - valleculae;Pharyngeal residue - cp segment;Pharyngeal   residue - pyriform sinuses;Compensatory strategies attempted  (Comment);Moderate aspiration Penetration/Aspiration details (thin straw): Material enters  airway, passes BELOW cords without attempt by patient to eject  out (silent aspiration) Pharyngeal - Solids Pharyngeal - Puree: Reduced anterior laryngeal mobility;Reduced  epiglottic inversion;Reduced laryngeal elevation;Reduced  airway/laryngeal closure;Reduced tongue base  retraction;Pharyngeal residue - valleculae;Pharyngeal residue -  cp segment;Pharyngeal residue - pyriform sinuses;Compensatory  strategies attempted (Comment) Penetration/Aspiration details (puree): Material does not enter  airway  Cervical Esophageal Phase    GO    Cervical Esophageal Phase Cervical Esophageal Phase: Impaired Cervical Esophageal Phase - Comment Cervical Esophageal Comment: see impression statement        Herbie Baltimore, MA CCC-SLP [phone]  Katherene Ponto Deblois 01/21/2014, 10:56 AM     Scheduled Meds: . antiseptic oral rinse  15 mL Mouth Rinse QID  . aspirin  81 mg Oral Daily  . chlorhexidine  15 mL Mouth Rinse BID  . feeding supplement (NEPRO CARB STEADY)  1,000 mL Per Tube Q24H  . insulin aspart  0-24 Units Subcutaneous 6 times per day  . pantoprazole (PROTONIX) IV  40 mg Intravenous Q24H   Continuous Infusions: . sodium chloride 10 mL/hr at 01/14/14 0015   Antibiotics Given (last 72 hours)   Date/Time Action Medication Dose Rate   01/19/14 1227 Given   piperacillin-tazobactam (ZOSYN) IVPB 3.375 g 3.375 g 100 mL/hr   01/19/14 2014 Given   piperacillin-tazobactam (ZOSYN) IVPB 2.25 g 2.25 g 100 mL/hr   01/20/14 0400 Given   piperacillin-tazobactam (ZOSYN) IVPB 2.25 g 2.25 g 100 mL/hr   01/20/14 1452 Given   piperacillin-tazobactam (ZOSYN) IVPB 2.25 g 2.25 g 100 mL/hr   01/20/14 2251 Given   piperacillin-tazobactam (ZOSYN) IVPB 2.25 g 2.25 g 100 mL/hr      Active Problems:   Severe sepsis   Acute renal failure   Metabolic acidosis    Hyperkalemia   Thrombocytopenia   Anemia   Aspiration pneumonia   Diabetes mellitus    Time spent: [place] Pager [phone]. If 7PM-7AM, please contact night-coverage at [email or web address], password [facility] 01/22/2014, 8:31 AM  LOS: 9 days

## 2014-01-23 ENCOUNTER — Inpatient Hospital Stay (HOSPITAL_COMMUNITY): Payer: Medicare Other

## 2014-01-23 DIAGNOSIS — R1310 Dysphagia, unspecified: Secondary | ICD-10-CM

## 2014-01-23 LAB — GLUCOSE, CAPILLARY
Glucose-Capillary: 129 mg/dL — ABNORMAL HIGH (ref 70–99)
Glucose-Capillary: 103 mg/dL — ABNORMAL HIGH (ref 70–99)
Glucose-Capillary: 105 mg/dL — ABNORMAL HIGH (ref 70–99)
Glucose-Capillary: 108 mg/dL — ABNORMAL HIGH (ref 70–99)
Glucose-Capillary: 123 mg/dL — ABNORMAL HIGH (ref 70–99)
Glucose-Capillary: 109 mg/dL — ABNORMAL HIGH (ref 70–99)

## 2014-01-23 NOTE — Progress Notes (Signed)
PROGRESS NOTE  [name] OVZ:[id number] DOB: October 12, 1940 DOA: 01/13/2014 PCP: No primary provider on file.    BRIEF PATIENT DESCRIPTION: 73 year old male with HTN and DM presented 5/27 to ED c/o N/V since 5/25. In ED found to be in acute renal failure, hyperkalemic, hypotensive with SBP's in 70s. Started on BiPAP for hypoxia and increased WOB. PCCM asked to admit.   SIGNIFICANT EVENTS:  5/27 admitted  5/28 intubated, shock, pressors, CVVH  5/30 Start pressure support trials  6/1 extubated, off pressors  6/2 D/c'd CRRT  6/3 Improving confusion   STUDIES:  Echo 5/28 >> EF 65 to 41%, grade 2 diastolic dysfx, mod AS  Renal u/s 5/28 >> medical renal disease     Assessment/Plan:  [name] 2/2 ATN related to NSAID, ACEi, hypovolemia, PNA, Septic shock -CRRT 5/28-6/2.  -last HD was 6/5  -nephrology  Anemia: Hb > 8  PNA s/p Zosyn  AMS/Delierium -more oriented this AM  Leukocytosis  DM -SSI -was on metformin at home  Dysphagia -TF per NGT tube- currently Speech following  Acute Hypoxemic Respiratory Failure 2nd to PNA, and pulmonary edema - resolved. Extubated 6/1. 6/02: CXR  greatly improved with better aeration  Septic shock - resolved   NSTEMI likely from demand ischemia   Code Status: full Family Communication: patient/ called [name]- # under demographics Disposition Plan: med surg then SNF once renal status determined   Consultants:  Nephrology  PCCM  PT/OT  SLP      HPI/Subjective: NG tube came out last PM No new c/o this AM  Objective: Filed Vitals:   01/23/14 0632  BP: 116/57  Pulse: 86  Temp: 98.4 F (36.9 C)  Resp: 18    Intake/Output Summary (Last 24 hours) at 01/23/14 0859 Last data filed at 01/23/14 0023  Gross per 24 hour  Intake    165 ml  Output   1299 ml  Net  -1134 ml   Filed Weights   01/22/14 2107 01/22/14 2112 01/23/14 0023  Weight: 79.6 kg (175 lb 7.8 oz) 80.2 kg (176 lb 12.9 oz) 79.3 kg  (174 lb 13.2 oz)    Exam:  General: chronically ill Neuro: A+Ox4 HEENT: PERRL  Cardiovascular: regular, 2/6 SM  Lungs: Diminished throughout  Abdomen: Soft, non-tender, non-distended  Musculoskeletal: No acute deformity  Skin/Lines: Intact, left IJ central line in place   Data Reviewed: Basic Metabolic Panel:  Recent Labs Lab 01/17/14 0242  01/18/14 0445 01/18/14 1600 01/19/14 0445 01/20/14 0500 01/21/14 0420 01/22/14 0708  NA  --   < > 139 140 138 141 140 143  K  --   < > 3.6* 3.5* 3.9 4.1 3.8 3.9  CL  --   < > 101 101 99 101 99 96  CO2  --   < > 25 26 25 22 22  18*  GLUCOSE  --   < > 151* 99 134* 132* 131* 124*  BUN  --   < > 38* 38* 29* 65* 65* 110*  CREATININE  --   < > 1.60* 1.68* 1.61* 4.08* 4.68* 7.90*  CALCIUM  --   < > 7.9* 8.2* 8.5 8.8 8.5 9.0  MG 2.5  --  2.6*  --  2.6* 3.2*  --   --   PHOS  --   < > 2.0* 1.3* 2.3 4.7* 7.1*  --   < > = values in this interval not displayed. Liver Function Tests:  Recent Labs Lab 01/18/14 0445 01/18/14  1600 01/19/14 0445 01/20/14 0500 01/21/14 0420  AST 54*  --   --   --  52*  ALT 26  --   --   --  39  ALKPHOS 102  --   --   --  92  BILITOT 0.8  --   --   --  0.6  PROT 6.2  --   --   --  6.8  ALBUMIN 2.4* 2.5* 2.6* 2.4* 2.6*   No results found for this basename: LIPASE, AMYLASE,  in the last 168 hours No results found for this basename: AMMONIA,  in the last 168 hours CBC:  Recent Labs Lab 01/18/14 0445 01/19/14 0445 01/20/14 0500 01/21/14 0420 01/22/14 0708  WBC 19.9* 24.1* 24.6* 20.6* 20.6*  HGB 7.8* 8.5* 8.2* 9.0* 9.5*  HCT 24.0* 25.7* 24.6* 27.5* 29.7*  MCV 87.9 87.4 87.2 89.0 88.7  PLT 96* 165 212 291 377   Cardiac Enzymes:  Recent Labs Lab 01/20/14 0500  CKTOTAL 68   BNP (last 3 results)  Recent Labs  01/13/14 2125  PROBNP 35530.0*   CBG:  Recent Labs Lab 01/22/14 1137 01/22/14 1647 01/22/14 2106 01/23/14 0625 01/23/14 0753  GLUCAP 147* 163* 129* 103* 105*    Recent  Results (from the past 240 hour(s))  CULTURE, BLOOD (ROUTINE X 2)     Status: None   Collection Time    01/13/14  9:40 PM      Result Value Ref Range Status   Specimen Description BLOOD LEFT HAND   Final   Special Requests BOTTLES DRAWN AEROBIC AND ANAEROBIC 5ML   Final   Culture  Setup Time     Final   Value: 01/14/2014 03:54     Performed at [facility]     Final   Value: NO GROWTH 5 DAYS     Performed at [facility]   Report Status 01/20/2014 FINAL   Final  CULTURE, BLOOD (ROUTINE X 2)     Status: None   Collection Time    01/13/14  9:45 PM      Result Value Ref Range Status   Specimen Description BLOOD RIGHT HAND   Final   Special Requests BOTTLES DRAWN AEROBIC ONLY [facility]   Final   Culture  Setup Time     Final   Value: 01/14/2014 03:54     Performed at [facility]     Final   Value: NO GROWTH 5 DAYS     Performed at [facility]   Report Status 01/20/2014 FINAL   Final  URINE CULTURE     Status: None   Collection Time    01/13/14  9:46 PM      Result Value Ref Range Status   Specimen Description URINE, CATHETERIZED   Final   Special Requests ADDED [id number]   Final   Culture  Setup Time     Final   Value: 01/14/2014 01:06     Performed at [facility]     Final   Value: NO GROWTH     Performed at [facility]     Final   Value: NO GROWTH     Performed at [facility]   Report Status 01/15/2014 FINAL   Final  CULTURE, RESPIRATORY (NON-EXPECTORATED)     Status: None   Collection Time    01/14/14 12:27 PM  Result Value Ref Range Status   Specimen Description TRACHEAL ASPIRATE   Final   Special Requests NONE   Final   Gram Stain     Final   Value: MODERATE WBC PRESENT,BOTH PMN AND MONONUCLEAR     NO SQUAMOUS EPITHELIAL CELLS SEEN     NO ORGANISMS SEEN     Performed at [facility]   Culture     Final   Value: NO GROWTH 2 DAYS     Performed at  [facility]   Report Status 01/16/2014 FINAL   Final     Studies: Dg Abd Portable 1v  01/21/2014   CLINICAL DATA:  Feeding tube placement.  EXAM: PORTABLE ABDOMEN - 1 VIEW  COMPARISON:  None.  FINDINGS: Weighted tip feeding tube is present in the proximal gastric fundus. Barium is present in small bowel. The bowel gas pattern is nonobstructive. Gaseous distention of the colon.  IMPRESSION: Weighted tip feeding tube in the proximal gastric fundus.   Electronically Signed   By: Dereck Ligas M.D.   On: 01/21/2014 16:32   Dg Swallowing Func-speech Pathology  01/21/2014   Katherene Ponto Deblois, CCC-SLP     01/21/2014 10:58 AM Objective Swallowing Evaluation: Modified Barium Swallowing Study   Patient Details  Name: SADIQ MCCAULEY MRN: [id number] Date of Birth: 01-17-1941  Today's Date: 01/21/2014 Time: 0930-1000 SLP Time Calculation (min): 30 min  Past Medical History:  Past Medical History  Diagnosis Date  . Diabetes mellitus without complication   . Hypertension   . Hypercholesteremia    Past Surgical History:  Past Surgical History  Procedure Laterality Date  . Prostatectomy    . Colon resection     HPI:  73 year old male with HTN and DM presented 5/27 to ED c/o N/V  since 5/25. In ED found to be in acute renal failure,  hyperkalemic, hypotensive with SBP's in 70s. Started on BiPAP for  hypoxia and increased WOB. Intubated from 5/28 to 6/1. Pt with  ICU delirium. Admission Chest CXR says New confluent bibasilar  pulmonary opacity, nonspecific but consider bilateral pneumonia  or aspiration.      Assessment / Plan / Recommendation Clinical Impression  Dysphagia Diagnosis: Moderate cervical esophageal phase  dysphagia;Severe pharyngeal phase dysphagia;Mild oral phase  dysphagia Clinical impression: Pt demonstrates a primary structural  cervical esophageal dysphagia, present at baseline, that is  likely worsed by pts mental status and generalized weakness. Pt  was awake, but required max cues to keep head  up, bring cup to  lips and orally accept POs. Oral phase characterized by slow  transit. Pt noted to have the appearance of bony protrusion at  C5/6 and a prominent cricopharyngeus (no radiologist present to  confirm) as well as reduced laryngeal elevation and hyoid  excursion. These deficits result in incomplete airway closure  with silent aspiration during the swallow and residuals over the  CP segment and in the valleculae that are repentrated after the  swallow even if pt cued to clear his throat. Findings consistent  with possible admission with aspiration pna. Pt would be at high  risk of aspiration at this time and would benefit from several  days of recovery prior to retesting and attempting POs. Improved  mental status and general strength would be indicators for  retest. Recommend NPO with short term alternate nutrition. SLP  will revisit pt on Monday for possiblity of retest.     Treatment Recommendation  Therapy as outlined in treatment plan  below    Diet Recommendation NPO;Alternative means - temporary   Medication Administration: Via alternative means    Other  Recommendations Oral Care Recommendations: Oral care Q4  per protocol   Follow Up Recommendations  Skilled Nursing facility    Frequency and Duration min 3x week  2 weeks   Pertinent Vitals/Pain NA    SLP Swallow Goals     General HPI: 73 year old male with HTN and DM presented 5/27 to  ED c/o N/V since 5/25. In ED found to be in acute renal failure,  hyperkalemic, hypotensive with SBP's in 70s. Started on BiPAP for  hypoxia and increased WOB. Intubated from 5/28 to 6/1. Pt with  ICU delirium. Admission Chest CXR says New confluent bibasilar  pulmonary opacity, nonspecific but consider bilateral pneumonia  or aspiration.  Type of Study: Modified Barium Swallowing Study Reason for Referral: Objectively evaluate swallowing function Previous Swallow Assessment: none in chart Diet Prior to this Study: NPO Temperature Spikes Noted: No Respiratory  Status: Nasal cannula History of Recent Intubation: Yes Length of Intubations (days): 5 days Date extubated: 01/18/14 Behavior/Cognition: Alert;Cooperative;Requires  cueing;Distractible Oral Cavity - Dentition: Dentures, top;Dentures, bottom Oral Motor / Sensory Function: Impaired - see Bedside swallow  eval Self-Feeding Abilities: Able to feed self;Needs assist Patient Positioning: Upright in chair Baseline Vocal Quality: Low vocal intensity;Wet Volitional Cough: Strong Volitional Swallow: Able to elicit Anatomy:  (bony protrustion at C5/6, Prominent CP, no radiologist  pres.) Pharyngeal Secretions:  (suspect standing secretions)    Reason for Referral Objectively evaluate swallowing function   Oral Phase Oral Preparation/Oral Phase Oral Phase: Impaired Oral - Honey Oral - Honey Teaspoon: Delayed oral transit Oral - Nectar Oral - Nectar Teaspoon: Delayed oral transit Oral - Nectar Cup: Delayed oral transit Oral - Thin Oral - Thin Teaspoon: Right anterior bolus loss;Left anterior  bolus loss;Delayed oral transit Oral - Thin Straw: Delayed oral transit (pt struggles to tip head  and cup back for thin cup) Oral - Solids Oral - Puree: Within functional limits   Pharyngeal Phase Pharyngeal Phase Pharyngeal Phase: Impaired Pharyngeal - Honey Pharyngeal - Honey Teaspoon: Reduced anterior laryngeal  mobility;Reduced epiglottic inversion;Reduced laryngeal  elevation;Reduced airway/laryngeal closure;Reduced tongue base  retraction;Penetration/Aspiration after  swallow;Penetration/Aspiration during swallow;Trace  aspiration;Pharyngeal residue - valleculae;Pharyngeal residue -  cp segment;Pharyngeal residue - pyriform sinuses;Compensatory  strategies attempted (Comment) (head turn left - not effective) Penetration/Aspiration details (honey teaspoon): Material enters  airway, CONTACTS cords and not ejected out;Material enters  airway, passes BELOW cords without attempt by patient to eject  out (silent aspiration) Pharyngeal -  Nectar Pharyngeal - Nectar Teaspoon: Reduced anterior laryngeal  mobility;Reduced epiglottic inversion;Reduced laryngeal  elevation;Reduced airway/laryngeal closure;Reduced tongue base  retraction;Penetration/Aspiration after  swallow;Penetration/Aspiration during swallow;Trace  aspiration;Pharyngeal residue - valleculae;Pharyngeal residue -  cp segment;Pharyngeal residue - pyriform sinuses;Compensatory  strategies attempted (Comment) Penetration/Aspiration details (nectar teaspoon): Material enters  airway, CONTACTS cords and not ejected out;Material enters  airway, passes BELOW cords without attempt by patient to eject  out (silent aspiration) Pharyngeal - Nectar Cup: Reduced anterior laryngeal  mobility;Reduced epiglottic inversion;Reduced laryngeal  elevation;Reduced airway/laryngeal closure;Reduced tongue base  retraction;Penetration/Aspiration after  swallow;Penetration/Aspiration during swallow;Trace  aspiration;Pharyngeal residue - valleculae;Pharyngeal residue -  cp segment;Pharyngeal residue - pyriform sinuses;Compensatory  strategies attempted (Comment) Penetration/Aspiration details (nectar cup): Material enters  airway, CONTACTS cords and not ejected out;Material enters  airway, passes BELOW cords without attempt by patient to eject  out (silent aspiration) Pharyngeal - Thin Pharyngeal - Thin Teaspoon: Not tested (all  spilled from mouth) Pharyngeal - Thin Straw: Reduced anterior laryngeal  mobility;Reduced epiglottic inversion;Reduced laryngeal  elevation;Reduced airway/laryngeal closure;Reduced tongue base  retraction;Penetration/Aspiration during swallow;Pharyngeal  residue - valleculae;Pharyngeal residue - cp segment;Pharyngeal  residue - pyriform sinuses;Compensatory strategies attempted  (Comment);Moderate aspiration Penetration/Aspiration details (thin straw): Material enters  airway, passes BELOW cords without attempt by patient to eject  out (silent aspiration) Pharyngeal - Solids Pharyngeal -  Puree: Reduced anterior laryngeal mobility;Reduced  epiglottic inversion;Reduced laryngeal elevation;Reduced  airway/laryngeal closure;Reduced tongue base  retraction;Pharyngeal residue - valleculae;Pharyngeal residue -  cp segment;Pharyngeal residue - pyriform sinuses;Compensatory  strategies attempted (Comment) Penetration/Aspiration details (puree): Material does not enter  airway  Cervical Esophageal Phase    GO    Cervical Esophageal Phase Cervical Esophageal Phase: Impaired Cervical Esophageal Phase - Comment Cervical Esophageal Comment: see impression statement        Herbie Baltimore, MA CCC-SLP [phone]  Katherene Ponto Deblois 01/21/2014, 10:56 AM     Scheduled Meds: . antiseptic oral rinse  15 mL Mouth Rinse BID  . aspirin  81 mg Oral Daily  . feeding supplement (NEPRO CARB STEADY)  1,000 mL Per Tube Q24H  . insulin aspart  0-24 Units Subcutaneous 6 times per day  . pantoprazole (PROTONIX) IV  40 mg Intravenous Q24H   Continuous Infusions:   Antibiotics Given (last 72 hours)   Date/Time Action Medication Dose Rate   01/20/14 1452 Given   piperacillin-tazobactam (ZOSYN) IVPB 2.25 g 2.25 g 100 mL/hr   01/20/14 2251 Given   piperacillin-tazobactam (ZOSYN) IVPB 2.25 g 2.25 g 100 mL/hr      Active Problems:   Severe sepsis   Acute renal failure   Metabolic acidosis   Hyperkalemia   Thrombocytopenia   Anemia   Aspiration pneumonia   Diabetes mellitus    Time spent: [facility] Pager [phone]. If 7PM-7AM, please contact night-coverage at [email or web address], password [facility] 01/23/2014, 8:59 AM  LOS: 10 days

## 2014-01-23 NOTE — Progress Notes (Signed)
NG tube removed unintentionally by patient. Raliegh Ip Schorr notified. Per NP orders, reinsert NG Tube in AM with appropriate RN. Will continue to monitor. Cathleen Fears Elisha Ponder

## 2014-01-23 NOTE — Progress Notes (Signed)
Admit: 01/13/2014 LOS: 10  36M w/ AKI (? BL SCr) 2/2 NSAIDs, ACEi, vol depletion w/ aspiration PNA and Septic Shock, VDRF and ARDS  Subjective:  Making steady progress Looks better globally Wife and pt updated Made a scant amount of urine yesterday  06/05 0701 - 06/06 0700 In: 220 [NG/GT:220] Out: 1299 [Urine:99]  Filed Weights   01/22/14 2107 01/22/14 2112 01/23/14 0023  Weight: 79.6 kg (175 lb 7.8 oz) 80.2 kg (176 lb 12.9 oz) 79.3 kg (174 lb 13.2 oz)    Current meds: reviewed  Current Labs: reviewed    Physical Exam:  Blood pressure 111/71, pulse 83, temperature 98.5 F (36.9 C), temperature source Oral, resp. rate 17, height 5\' 11"  (1.803 m), weight 79.3 kg (174 lb 13.2 oz), SpO2 96.00%. NAD,  NCAT, [place] EOMI IRIR Coarse bs throughout S/nt/nd, no SP fullness No LEE NO rashes/lesions Confused  Assessment 1. [name] 2/2 ATN related to NSAID, ACEi, hypovolemia, PNA, Septic shock: CRRT 5/28-6/2.  Last iHD 01/22/14.  Some UOP, follow clinically and assess daily for RRT needs, next tentative is 01/25/14 2. Anemia: Hb > 8, no ESA given AKI 3. PNA s/p Zosyn 4. AMS/Delierium per TRH, improving  Pearson Grippe MD 01/23/2014, 11:05 AM   Recent Labs Lab 01/19/14 0445 01/20/14 0500 01/21/14 0420 01/22/14 0708  NA 138 141 140 143  K 3.9 4.1 3.8 3.9  CL 99 101 99 96  CO2 25 22 22  18*  GLUCOSE 134* 132* 131* 124*  BUN 29* 65* 65* 110*  CREATININE 1.61* 4.08* 4.68* 7.90*  CALCIUM 8.5 8.8 8.5 9.0  PHOS 2.3 4.7* 7.1*  --     Recent Labs Lab 01/20/14 0500 01/21/14 0420 01/22/14 0708  WBC 24.6* 20.6* 20.6*  HGB 8.2* 9.0* 9.5*  HCT 24.6* 27.5* 29.7*  MCV 87.2 89.0 88.7  PLT 212 291 377

## 2014-01-23 NOTE — Progress Notes (Signed)
Panda  Tube placed per protocol. Order for kub entered per protocol.  Awaiting for film/results.

## 2014-01-23 NOTE — Progress Notes (Signed)
Report reviewed  From  Kub, tube advanced  From 75 to 80  On the lines. 2nd Rn  At bedside for verification.

## 2014-01-24 LAB — CBC
HEMATOCRIT: 26.2 % — AB (ref 39.0–52.0)
HEMOGLOBIN: 8.6 g/dL — AB (ref 13.0–17.0)
MCH: 29.1 pg (ref 26.0–34.0)
MCHC: 32.8 g/dL (ref 30.0–36.0)
MCV: 88.5 fL (ref 78.0–100.0)
PLATELETS: 405 10*3/uL — AB (ref 150–400)
RBC: 2.96 MIL/uL — AB (ref 4.22–5.81)
RDW: 18 % — ABNORMAL HIGH (ref 11.5–15.5)
WBC: 17.6 10*3/uL — AB (ref 4.0–10.5)

## 2014-01-24 LAB — BASIC METABOLIC PANEL
BUN: 103 mg/dL — ABNORMAL HIGH (ref 6–23)
CHLORIDE: 95 meq/L — AB (ref 96–112)
CO2: 22 meq/L (ref 19–32)
CREATININE: 8.34 mg/dL — AB (ref 0.50–1.35)
Calcium: 9.1 mg/dL (ref 8.4–10.5)
GFR calc Af Amer: 7 mL/min — ABNORMAL LOW (ref >90)
GFR calc non Af Amer: 6 mL/min — ABNORMAL LOW (ref >90)
GLUCOSE: 116 mg/dL — AB (ref 70–99)
Potassium: 4 mEq/L (ref 3.7–5.3)
Sodium: 141 mEq/L (ref 137–147)

## 2014-01-24 LAB — GLUCOSE, CAPILLARY
GLUCOSE-CAPILLARY: 131 mg/dL — AB (ref 70–99)
Glucose-Capillary: 111 mg/dL — ABNORMAL HIGH (ref 70–99)
Glucose-Capillary: 117 mg/dL — ABNORMAL HIGH (ref 70–99)
Glucose-Capillary: 121 mg/dL — ABNORMAL HIGH (ref 70–99)
Glucose-Capillary: 122 mg/dL — ABNORMAL HIGH (ref 70–99)
Glucose-Capillary: 95 mg/dL (ref 70–99)

## 2014-01-24 NOTE — Clinical Social Work Placement (Addendum)
Clinical Social Work Department CLINICAL SOCIAL WORK PLACEMENT NOTE 01/24/2014  Patient:  [name], [name]  Account Number:  [id number] Admit date:  01/13/2014  Clinical Social Worker:  Carrington Clamp, LCSWA  Date/time:  01/23/2014 05:13 PM  Clinical Social Work is seeking post-discharge placement for this patient at the following level of care:   [place]   (*CSW will update this form in Epic as items are completed)   01/23/2014  Patient/family provided with [facility] Department of Clinical Social Work's list of facilities offering this level of care within the geographic area requested by the patient (or if unable, by the patient's family).  01/23/2014  Patient/family informed of their freedom to choose among providers that offer the needed level of care, that participate in Medicare, Medicaid or managed care program needed by the patient, have an available bed and are willing to accept the patient.  01/23/2014  Patient/family informed of [facility]' ownership interest in [facility], as well as of the fact that they are under no obligation to receive care at this facility.  PASARR submitted to EDS on 01/28/2014 PASARR number received on 01/28/2014  FL2 transmitted to all facilities in geographic area requested by pt/family on  01/26/2014 FL2 transmitted to all facilities within larger geographic area on   Patient informed that his/her managed care company has contracts with or will negotiate with  certain facilities, including the following:     Patient/family informed of bed offers received: 01/29/14  Patient chooses bed at [facility] Physician recommends and patient chooses bed at    Patient to be transferred to [facility] on 02/11/14   Patient to be transferred to facility by wife Patient and family notified of transfer on 02/11/14 Name of family member notified: Wife [name] (at the bedside)   The following physician request  were entered in Epic:  Additional Comments: 01/27/14: CSW called wife and left message regarding bed offers. Preference is [facility].                    Shelle Iron, LCSW) 01/28/14: CSW received call from [facility] admissions director Nira Conn to receive update on patient's readiness for discharge. They have made a bed offer and are trying to hold a bed for him. Shelle Iron, LCSW) 02/10/14: Call made to [facility] to advise that patient will be ready for discharge on Thursday, 02/11/14. Updated clinicals requested and transmitted to facility.      [place], [place] Weekend Clinical Social Worker [phone]

## 2014-01-24 NOTE — Progress Notes (Signed)
PROGRESS NOTE  [name] TXM:[id number] DOB: 07/21/1941 DOA: 01/13/2014 PCP: No primary provider on file.    BRIEF PATIENT DESCRIPTION: 73 year old male with HTN and DM presented 5/27 to ED c/o N/V since 5/25. In ED found to be in acute renal failure, hyperkalemic, hypotensive with SBP's in 70s. Started on BiPAP for hypoxia and increased WOB. PCCM asked to admit.   SIGNIFICANT EVENTS:  5/27 admitted  5/28 intubated, shock, pressors, CVVH  5/30 Start pressure support trials  6/1 extubated, off pressors  6/2 D/c'd CRRT  6/3 Improving confusion   STUDIES:  Echo 5/28 >> EF 65 to 48%, grade 2 diastolic dysfx, mod AS  Renal u/s 5/28 >> medical renal disease     Assessment/Plan:  [name] 2/2 ATN related to NSAID, ACEi, hypovolemia, PNA, Septic shock -CRRT 5/28-6/2.  -last HD was 6/5  -nephrology  Anemia: Hb > 8  PNA s/p Zosyn  AMS/Delierium -more oriented this AM  Leukocytosis  DM -SSI -was on metformin at home  Dysphagia -TF per NGT tube- currently Speech following- will see again on Monday  Acute Hypoxemic Respiratory Failure 2nd to PNA, and pulmonary edema - resolved. Extubated 6/1. 6/02: CXR  greatly improved with better aeration  Septic shock - resolved   NSTEMI likely from demand ischemia   Code Status: full Family Communication: patient/ called [name]-  On phone in patient's room Disposition Plan: med surg then SNF once renal status determined   Consultants:  Nephrology  PCCM  PT/OT  SLP      HPI/Subjective: NG tube replaced No new c/o this AM- anxious to get better and go home   Objective: Filed Vitals:   01/24/14 0455  BP: 133/77  Pulse: 79  Temp: 98.2 F (36.8 C)  Resp: 18    Intake/Output Summary (Last 24 hours) at 01/24/14 0805 Last data filed at 01/23/14 1800  Gross per 24 hour  Intake      0 ml  Output      0 ml  Net      0 ml   Filed Weights   01/22/14 2112 01/23/14 0023 01/23/14 2021    Weight: 80.2 kg (176 lb 12.9 oz) 79.3 kg (174 lb 13.2 oz) 79.3 kg (174 lb 13.2 oz)    Exam:  General: chronically ill Neuro: A+Ox4 HEENT: PERRL  Cardiovascular: regular, 2/6 SM  Lungs: Diminished throughout  Abdomen: Soft, non-tender, non-distended  Musculoskeletal: No acute deformity  Skin/Lines: Intact, left IJ central line in place   Data Reviewed: Basic Metabolic Panel:  Recent Labs Lab 01/18/14 0445 01/18/14 1600 01/19/14 0445 01/20/14 0500 01/21/14 0420 01/22/14 0708 01/24/14 0324  NA 139 140 138 141 140 143 141  K 3.6* 3.5* 3.9 4.1 3.8 3.9 4.0  CL 101 101 99 101 99 96 95*  CO2 25 26 25 22 22  18* 22  GLUCOSE 151* 99 134* 132* 131* 124* 116*  BUN 38* 38* 29* 65* 65* 110* 103*  CREATININE 1.60* 1.68* 1.61* 4.08* 4.68* 7.90* 8.34*  CALCIUM 7.9* 8.2* 8.5 8.8 8.5 9.0 9.1  MG 2.6*  --  2.6* 3.2*  --   --   --   PHOS 2.0* 1.3* 2.3 4.7* 7.1*  --   --    Liver Function Tests:  Recent Labs Lab 01/18/14 0445 01/18/14 1600 01/19/14 0445 01/20/14 0500 01/21/14 0420  AST 54*  --   --   --  52*  ALT 26  --   --   --  39  ALKPHOS 102  --   --   --  92  BILITOT 0.8  --   --   --  0.6  PROT 6.2  --   --   --  6.8  ALBUMIN 2.4* 2.5* 2.6* 2.4* 2.6*   No results found for this basename: LIPASE, AMYLASE,  in the last 168 hours No results found for this basename: AMMONIA,  in the last 168 hours CBC:  Recent Labs Lab 01/19/14 0445 01/20/14 0500 01/21/14 0420 01/22/14 0708 01/24/14 0324  WBC 24.1* 24.6* 20.6* 20.6* 17.6*  HGB 8.5* 8.2* 9.0* 9.5* 8.6*  HCT 25.7* 24.6* 27.5* 29.7* 26.2*  MCV 87.4 87.2 89.0 88.7 88.5  PLT 165 212 291 377 405*   Cardiac Enzymes:  Recent Labs Lab 01/20/14 0500  CKTOTAL 68   BNP (last 3 results)  Recent Labs  01/13/14 2125  PROBNP 35530.0*   CBG:  Recent Labs Lab 01/23/14 1218 01/23/14 1649 01/23/14 2017 01/23/14 2352 01/24/14 0451  GLUCAP 108* 123* 109* 95 111*    Recent Results (from the past 240 hour(s))   CULTURE, RESPIRATORY (NON-EXPECTORATED)     Status: None   Collection Time    01/14/14 12:27 PM      Result Value Ref Range Status   Specimen Description TRACHEAL ASPIRATE   Final   Special Requests NONE   Final   Gram Stain     Final   Value: MODERATE WBC PRESENT,BOTH PMN AND MONONUCLEAR     NO SQUAMOUS EPITHELIAL CELLS SEEN     NO ORGANISMS SEEN     Performed at [facility]   Culture     Final   Value: NO GROWTH 2 DAYS     Performed at [facility]   Report Status 01/16/2014 FINAL   Final     Studies: Dg Abd 1 View  01/23/2014   CLINICAL DATA:  Feeding tube placement.  EXAM: ABDOMEN - 1 VIEW  COMPARISON:  01/21/2014  FINDINGS: Feeding tube tip is seen with tip in the fundus of the stomach. Some residual contrast is seen within the colon. No dilated bowel loops seen.  IMPRESSION: Feeding tube tip in fundus of stomach.   Electronically Signed   By: Earle Gell M.D.   On: 01/23/2014 18:57   Dg Abd Portable 1v  01/23/2014   CLINICAL DATA:  Feeding tube placement.  EXAM: PORTABLE ABDOMEN - 1 VIEW  COMPARISON:  01/23/2014  FINDINGS: The Dobbhoff feeding tube is looped back on itself in the descending duodenum. The tip is in the antropyloric region of the stomach.  IMPRESSION: Dobbhoff feeding tube is looped back on itself in the descending duodenum.   Electronically Signed   By: Kalman Jewels M.D.   On: 01/23/2014 23:44    Scheduled Meds: . antiseptic oral rinse  15 mL Mouth Rinse BID  . aspirin  81 mg Oral Daily  . feeding supplement (NEPRO CARB STEADY)  1,000 mL Per Tube Q24H  . insulin aspart  0-24 Units Subcutaneous 6 times per day  . pantoprazole (PROTONIX) IV  40 mg Intravenous Q24H   Continuous Infusions:   Antibiotics Given (last 72 hours)   None      Active Problems:   Severe sepsis   Acute renal failure   Metabolic acidosis   Hyperkalemia   Thrombocytopenia   Anemia   Aspiration pneumonia   Diabetes mellitus    Time spent: [place]  [facility] Pager [phone]. If 7PM-7AM, please contact night-coverage at [email or web address], password [facility] 01/24/2014, 8:05 AM  LOS: 11 days

## 2014-01-24 NOTE — Clinical Social Work Psychosocial (Signed)
Clinical Social Work Department BRIEF PSYCHOSOCIAL ASSESSMENT 01/24/2014  Patient:  [name], [name]     Account Number:  [id number]     Admit date:  01/13/2014  Clinical Social Worker:  Hubert Azure  Date/Time:  01/23/2014 03:45 PM  Referred by:  Physician  Date Referred:  01/23/2014 Referred for  SNF Placement   Other Referral:   Interview type:  Patient Other interview type:    PSYCHOSOCIAL DATA Living Status:  WIFE Admitted from facility:   Level of care:   Primary support name:  [name] ([phone]) Primary support relationship to patient:  SPOUSE Degree of support available:   Good.    CURRENT CONCERNS Current Concerns  Post-Acute Placement   Other Concerns:    SOCIAL WORK ASSESSMENT / PLAN CSW met with patient who was alert and oriented x4. CSW introduced self and explained role. CSW discussed d/c plan with patient. Per patient he resides with wife [name]. patient stated he cannot walk and people "just help me around the house." Patient stated he does not have a wheelchair and would not disclose why. CSW asked patient how he is helped around the house. Patient stated people will lift him to include his wife and move him to where he needs to go. Patient admitted it was a lot on his wife and stated he is agreeable to SNF. Patient stated no preference for SNF in [place] close to his home or [facility].   Assessment/plan status:  Information/Referral to [employer] Other assessment/ plan:   CSW to complete FL2 for SNF placement.   Information/referral to community resources:   CSW provided patient with community SNF list.    PATIENT'S/FAMILY'S RESPONSE TO PLAN OF CARE: Patient thanked CSW for assistance with d/c planning and stated he felt it would help in improving his strength.     [place], [place] Weekend Clinical Social Worker [phone]

## 2014-01-24 NOTE — Progress Notes (Signed)
Admit: 01/13/2014 LOS: 11  57M w/ AKI (? BL SCr) 2/2 NSAIDs, ACEi, vol depletion w/ aspiration PNA and Septic Shock, VDRF and ARDS  Subjective:  Still improving NGT in place, barium swallow tomorrow No SOB No UOP yesterday     Filed Weights   01/22/14 2112 01/23/14 0023 01/23/14 2021  Weight: 80.2 kg (176 lb 12.9 oz) 79.3 kg (174 lb 13.2 oz) 79.3 kg (174 lb 13.2 oz)    Current meds: reviewed  Current Labs: reviewed    Physical Exam:  Blood pressure 125/73, pulse 70, temperature 98.4 F (36.9 C), temperature source Oral, resp. rate 19, height 5\' 11"  (1.803 m), weight 79.3 kg (174 lb 13.2 oz), SpO2 100.00%. NAD,  NCAT, NGT in place EOMI IRIR Coarse bs throughout S/nt/nd, no SP fullness No LEE NO rashes/lesions Confused  Assessment 1. [name] 2/2 ATN related to NSAID, ACEi, hypovolemia, PNA, Septic shock: CRRT 5/28-6/2.  Last iHD 01/22/14.  Follow clinically and assess daily for RRT needs, next tentative is 01/25/14.  Not ready to declare ESRD, hopeful for return of function 2. Anemia: Hb > 8, no ESA given AKI 3. PNA s/p Zosyn 4. AMS/Delierium per TRH, improving  Pearson Grippe MD 01/24/2014, 9:48 AM   Recent Labs Lab 01/19/14 0445 01/20/14 0500 01/21/14 0420 01/22/14 0708 01/24/14 0324  NA 138 141 140 143 141  K 3.9 4.1 3.8 3.9 4.0  CL 99 101 99 96 95*  CO2 25 22 22  18* 22  GLUCOSE 134* 132* 131* 124* 116*  BUN 29* 65* 65* 110* 103*  CREATININE 1.61* 4.08* 4.68* 7.90* 8.34*  CALCIUM 8.5 8.8 8.5 9.0 9.1  PHOS 2.3 4.7* 7.1*  --   --     Recent Labs Lab 01/21/14 0420 01/22/14 0708 01/24/14 0324  WBC 20.6* 20.6* 17.6*  HGB 9.0* 9.5* 8.6*  HCT 27.5* 29.7* 26.2*  MCV 89.0 88.7 88.5  PLT 291 377 405*

## 2014-01-25 LAB — GLUCOSE, CAPILLARY
GLUCOSE-CAPILLARY: 109 mg/dL — AB (ref 70–99)
GLUCOSE-CAPILLARY: 125 mg/dL — AB (ref 70–99)
GLUCOSE-CAPILLARY: 129 mg/dL — AB (ref 70–99)
GLUCOSE-CAPILLARY: 136 mg/dL — AB (ref 70–99)
GLUCOSE-CAPILLARY: 145 mg/dL — AB (ref 70–99)
Glucose-Capillary: 129 mg/dL — ABNORMAL HIGH (ref 70–99)
Glucose-Capillary: 151 mg/dL — ABNORMAL HIGH (ref 70–99)

## 2014-01-25 LAB — CBC
HCT: 25.3 % — ABNORMAL LOW (ref 39.0–52.0)
HEMOGLOBIN: 8.3 g/dL — AB (ref 13.0–17.0)
MCH: 28.2 pg (ref 26.0–34.0)
MCHC: 32.8 g/dL (ref 30.0–36.0)
MCV: 86.1 fL (ref 78.0–100.0)
Platelets: 461 10*3/uL — ABNORMAL HIGH (ref 150–400)
RBC: 2.94 MIL/uL — AB (ref 4.22–5.81)
RDW: 17.3 % — ABNORMAL HIGH (ref 11.5–15.5)
WBC: 16.1 10*3/uL — ABNORMAL HIGH (ref 4.0–10.5)

## 2014-01-25 LAB — BASIC METABOLIC PANEL
BUN: 140 mg/dL — ABNORMAL HIGH (ref 6–23)
CHLORIDE: 92 meq/L — AB (ref 96–112)
CO2: 18 meq/L — AB (ref 19–32)
Calcium: 8.9 mg/dL (ref 8.4–10.5)
Creatinine, Ser: 10.45 mg/dL — ABNORMAL HIGH (ref 0.50–1.35)
GFR calc Af Amer: 5 mL/min — ABNORMAL LOW (ref >90)
GFR, EST NON AFRICAN AMERICAN: 4 mL/min — AB (ref >90)
GLUCOSE: 123 mg/dL — AB (ref 70–99)
POTASSIUM: 4.2 meq/L (ref 3.7–5.3)
SODIUM: 140 meq/L (ref 137–147)

## 2014-01-25 MED ORDER — HEPARIN SODIUM (PORCINE) 1000 UNIT/ML DIALYSIS
20.0000 [IU]/kg | INTRAMUSCULAR | Status: DC | PRN
  Administered 2014-01-25: 1600 [IU] via INTRAVENOUS_CENTRAL

## 2014-01-25 NOTE — Progress Notes (Addendum)
NUTRITION FOLLOW UP  Intervention:   Continue Nepro at 55 ml/h to provide 2376 kcals, 107 gram protein, 960 ml free water daily. Diet advancement per SLP. RD to continue to follow nutrition care plan.  Nutrition Dx:   Inadequate oral intake related to inability to eat as evidenced by NPO status. Ongoing.  Goal:   Intake to meet >90% of estimated nutrition needs. Met.  Monitor:   Ability to advance PO diet, TF tolerance/adequacy, labs, weight trend.  Assessment:   73 year old male with HTN and DM presented 5/27 to ED c/o N/V since 5/25. In ED found to be in acute renal failure, hyperkalemic, hypotensive with SBP's in 70s. Started on BiPAP for hypoxia and increased WOB. Required intubation on 5/28.  Patient was extubated on 6/1. CRRT off since 6/2. S/P multiple swallow evaluations with SLP; S/P MBS 6/4 - pt was found to not be ready for diet advancement at this time.   Nepro enteral nutrition initiated via NGT on 6/4. Pt continues to receive Nepro at 55 ml/hr via NGT. RN confirms that pt is tolerating well. Discussed plan of care with SLP, who is currently seeing patient. She states that pt is appropriate for MBSS tomorrow.  Renal continues to follow patient and assess need for HD daily. Most recent renal note reports that team is anticipating renal recovery.  CBG's: 125-136 Sodium and potassium WNL Most recent phosphorus elevated at 7.1  Height: Ht Readings from Last 1 Encounters:  01/14/14 5' 11" (1.803 m)    Weight Status:  Trending down with fluid removal. Wt Readings from Last 1 Encounters:  01/25/14 173 lb 8 oz (78.7 kg)  78.7 kg s/p HD 6/8 79.5 kg s/p HD 6/6  Admission weight: 87.1 kg  Body mass index is 24.21 kg/(m^2). WNL  Re-estimated needs:  Kcal: 2000-2300 Protein: 95-110 gm Fluid: 1.2 L  Skin: WNL  Diet Order: NPO   Intake/Output Summary (Last 24 hours) at 01/25/14 1414 Last data filed at 01/25/14 1301  Gross per 24 hour  Intake 2030.5 ml  Output    -598 ml  Net 2628.5 ml    Last BM: 6/3   Labs:   Recent Labs Lab 01/19/14 0445 01/20/14 0500 01/21/14 0420 01/22/14 0708 01/24/14 0324 01/25/14 0715  NA 138 141 140 143 141 140  K 3.9 4.1 3.8 3.9 4.0 4.2  CL 99 101 99 96 95* 92*  CO2 25 22 22 18* 22 18*  BUN 29* 65* 65* 110* 103* 140*  CREATININE 1.61* 4.08* 4.68* 7.90* 8.34* 10.45*  CALCIUM 8.5 8.8 8.5 9.0 9.1 8.9  MG 2.6* 3.2*  --   --   --   --   PHOS 2.3 4.7* 7.1*  --   --   --   GLUCOSE 134* 132* 131* 124* 116* 123*    CBG (last 3)   Recent Labs  01/25/14 0415 01/25/14 0723 01/25/14 1238  GLUCAP 129* 125* 136*    Scheduled Meds: . antiseptic oral rinse  15 mL Mouth Rinse BID  . aspirin  81 mg Oral Daily  . feeding supplement (NEPRO CARB STEADY)  1,000 mL Per Tube Q24H  . insulin aspart  0-24 Units Subcutaneous 6 times per day  . pantoprazole (PROTONIX) IV  40 mg Intravenous Q24H    Continuous Infusions:      MS, RD, LDN Inpatient Registered Dietitian Pager: [phone] After-hours pager: [phone]     

## 2014-01-25 NOTE — Progress Notes (Signed)
Speech Language Pathology Treatment: Dysphagia  Patient Details Name: [name] MRN: [id number] DOB: 1941-06-19 Today's Date: 01/25/2014 Time: 1350-1410 SLP Time Calculation (min): 20 min  Assessment / Plan / Recommendation Clinical Impression  Pt mildly lethargic following HD, but still better able to follow commands than during MBS. Subjectively swallow response feels slightly stronger. Allowed pt to consume 4 oz of apllesauce without any wet vocal quality observed. Suspect general strength improving. Recommend repeat MBS tomorrow to reassess swallow function.    HPI HPI: 73 year old male with HTN and DM presented 5/27 to ED c/o N/V since 5/25. In ED found to be in acute renal failure, hyperkalemic, hypotensive with SBP's in 70s. Started on BiPAP for hypoxia and increased WOB. Intubated from 5/28 to 6/1. Pt with ICU delirium. Admission Chest CXR says New confluent bibasilar pulmonary opacity, nonspecific but consider bilateral pneumonia or aspiration.    Pertinent Vitals NA  SLP Plan  MBS    Recommendations Diet recommendations: NPO Medication Administration: Via alternative means              General recommendations: Rehab consult Oral Care Recommendations: Oral care Q4 per protocol Follow up Recommendations: Inpatient Rehab Plan: [place] Mittie Knittel, MA CCC-SLP [place] Deano Tomaszewski 01/25/2014, 2:57 PM

## 2014-01-25 NOTE — Progress Notes (Signed)
PROGRESS NOTE  [name] JAS:[id number] DOB: Mar 20, 1941 DOA: 01/13/2014 PCP: No primary provider on file.    BRIEF PATIENT DESCRIPTION: 73 year old male with HTN and DM presented 5/27 to ED c/o N/V since 5/25. In ED found to be in acute renal failure, hyperkalemic, hypotensive with SBP's in 70s. Started on BiPAP for hypoxia and increased WOB. PCCM asked to admit.   SIGNIFICANT EVENTS:  5/27 admitted  5/28 intubated, shock, pressors, CVVH  5/30 Start pressure support trials  6/1 extubated, off pressors  6/2 D/c'd CRRT  6/3 Improving confusion   STUDIES:  Echo 5/28 >> EF 65 to 41%, grade 2 diastolic dysfx, mod AS  Renal u/s 5/28 >> medical renal disease     Assessment/Plan:  [name] 2/2 ATN related to NSAID, ACEi, hypovolemia, PNA, Septic shock -CRRT 5/28-6/2.  -last HD was 6/8 -nephrology  Anemia: Hb > 8  PNA s/p Zosyn  AMS/Delierium -more oriented  Leukocytosis -trending doen  DM -SSI -was on metformin at home  Dysphagia -TF per NGT tube- currently Speech following- will see again on Monday  Acute Hypoxemic Respiratory Failure 2nd to PNA, and pulmonary edema - resolved. Extubated 6/1. 6/02: CXR  greatly improved with better aeration  Septic shock - resolved   NSTEMI likely from demand ischemia   Code Status: full Family Communication: patient/ spoke with  [name] yest On phone in patient's room Disposition Plan: med surg then SNF once renal status determined   Consultants:  Nephrology  PCCM  PT/OT  SLP      HPI/Subjective: Swallowing better per patient No SOB, no CP On dialysis currently   Objective: Filed Vitals:   01/25/14 0616  BP: 127/66  Pulse: 80  Temp: 98.7 F (37.1 C)  Resp: 18    Intake/Output Summary (Last 24 hours) at 01/25/14 0752 Last data filed at 01/24/14 1300  Gross per 24 hour  Intake      0 ml  Output      0 ml  Net      0 ml   Filed Weights   01/23/14 0023 01/23/14 2021 01/24/14  2115  Weight: 79.3 kg (174 lb 13.2 oz) 79.3 kg (174 lb 13.2 oz) 80.2 kg (176 lb 12.9 oz)    Exam:  General: chronically ill, speech muffled Neuro: A+Ox4 HEENT: PERRL  Cardiovascular: regular, 2/6 SM  Lungs: Diminished throughout  Abdomen: Soft, non-tender, non-distended  Musculoskeletal: No acute deformity  Skin/Lines: Intact, left IJ central line in place   Data Reviewed: Basic Metabolic Panel:  Recent Labs Lab 01/18/14 1600 01/19/14 0445 01/20/14 0500 01/21/14 0420 01/22/14 0708 01/24/14 0324  NA 140 138 141 140 143 141  K 3.5* 3.9 4.1 3.8 3.9 4.0  CL 101 99 101 99 96 95*  CO2 26 25 22 22  18* 22  GLUCOSE 99 134* 132* 131* 124* 116*  BUN 38* 29* 65* 65* 110* 103*  CREATININE 1.68* 1.61* 4.08* 4.68* 7.90* 8.34*  CALCIUM 8.2* 8.5 8.8 8.5 9.0 9.1  MG  --  2.6* 3.2*  --   --   --   PHOS 1.3* 2.3 4.7* 7.1*  --   --    Liver Function Tests:  Recent Labs Lab 01/18/14 1600 01/19/14 0445 01/20/14 0500 01/21/14 0420  AST  --   --   --  52*  ALT  --   --   --  39  ALKPHOS  --   --   --  92  BILITOT  --   --   --  0.6  PROT  --   --   --  6.8  ALBUMIN 2.5* 2.6* 2.4* 2.6*   No results found for this basename: LIPASE, AMYLASE,  in the last 168 hours No results found for this basename: AMMONIA,  in the last 168 hours CBC:  Recent Labs Lab 01/20/14 0500 01/21/14 0420 01/22/14 0708 01/24/14 0324 01/25/14 0715  WBC 24.6* 20.6* 20.6* 17.6* 16.1*  HGB 8.2* 9.0* 9.5* 8.6* 8.3*  HCT 24.6* 27.5* 29.7* 26.2* 25.3*  MCV 87.2 89.0 88.7 88.5 86.1  PLT 212 291 377 405* 461*   Cardiac Enzymes:  Recent Labs Lab 01/20/14 0500  CKTOTAL 68   BNP (last 3 results)  Recent Labs  01/13/14 2125  PROBNP 35530.0*   CBG:  Recent Labs Lab 01/24/14 1551 01/24/14 2009 01/25/14 0037 01/25/14 0415 01/25/14 0723  GLUCAP 131* 117* 109* 129* 125*    No results found for this or any previous visit (from the past 240 hour(s)).   Studies: Dg Abd 1 View  01/23/2014    CLINICAL DATA:  Feeding tube placement.  EXAM: ABDOMEN - 1 VIEW  COMPARISON:  01/21/2014  FINDINGS: Feeding tube tip is seen with tip in the fundus of the stomach. Some residual contrast is seen within the colon. No dilated bowel loops seen.  IMPRESSION: Feeding tube tip in fundus of stomach.   Electronically Signed   By: Earle Gell M.D.   On: 01/23/2014 18:57   Dg Abd Portable 1v  01/23/2014   CLINICAL DATA:  Feeding tube placement.  EXAM: PORTABLE ABDOMEN - 1 VIEW  COMPARISON:  01/23/2014  FINDINGS: The Dobbhoff feeding tube is looped back on itself in the descending duodenum. The tip is in the antropyloric region of the stomach.  IMPRESSION: Dobbhoff feeding tube is looped back on itself in the descending duodenum.   Electronically Signed   By: Kalman Jewels M.D.   On: 01/23/2014 23:44    Scheduled Meds: . antiseptic oral rinse  15 mL Mouth Rinse BID  . aspirin  81 mg Oral Daily  . feeding supplement (NEPRO CARB STEADY)  1,000 mL Per Tube Q24H  . insulin aspart  0-24 Units Subcutaneous 6 times per day  . pantoprazole (PROTONIX) IV  40 mg Intravenous Q24H   Continuous Infusions:   Antibiotics Given (last 72 hours)   None      Active Problems:   Severe sepsis   Acute renal failure   Metabolic acidosis   Hyperkalemia   Thrombocytopenia   Anemia   Aspiration pneumonia   Diabetes mellitus    Time spent: [place] [facility] Pager [phone]. If 7PM-7AM, please contact night-coverage at [email or web address], password [facility] 01/25/2014, 7:52 AM  LOS: 12 days

## 2014-01-25 NOTE — Procedures (Signed)
Tolerating hemodialysis, but low BP and sunken eyes, reduced skin turgor and no edema.  Will not pull fluid and keep positive.  Assessment  1. [name] 2/2 ATN related to NSAID, ACEi, hypovolemia, PNA, Septic shock: CRRT 5/28-6/2. Anticipate recovery 2. Anemia: Hb 8.3 today  Subjective: Interval History: On dialysis  Objective: Vital signs in last 24 hours: Temp:  [97.4 F (36.3 C)-98.7 F (37.1 C)] 98 F (36.7 C) (06/08 0754) Pulse Rate:  [70-98] 94 (06/08 0838) Resp:  [16-19] 16 (06/08 0838) BP: (81-139)/(55-73) 95/55 mmHg (06/08 0838) SpO2:  [97 %-100 %] 98 % (06/08 0754) Weight:  [78.2 kg (172 lb 6.4 oz)-80.2 kg (176 lb 12.9 oz)] 78.2 kg (172 lb 6.4 oz) (06/08 0754) Weight change: 0.9 kg (1 lb 15.7 oz)  Intake/Output from previous day:   Intake/Output this shift:    General appearance: alert and cooperative Head: Normocephalic, without obvious abnormality, atraumatic Extremities: extremities normal, atraumatic, no cyanosis or edema Reduced skin turgor, sunken eyes  Lab Results:  Recent Labs  01/24/14 0324 01/25/14 0715  WBC 17.6* 16.1*  HGB 8.6* 8.3*  HCT 26.2* 25.3*  PLT 405* 461*   BMET:  Recent Labs  01/24/14 0324 01/25/14 0715  NA 141 140  K 4.0 4.2  CL 95* 92*  CO2 22 18*  GLUCOSE 116* 123*  BUN 103* 140*  CREATININE 8.34* 10.45*  CALCIUM 9.1 8.9   No results found for this basename: PTH,  in the last 72 hours Iron Studies: No results found for this basename: IRON, TIBC, TRANSFERRIN, FERRITIN,  in the last 72 hours Studies/Results: Dg Abd 1 View  01/23/2014   CLINICAL DATA:  Feeding tube placement.  EXAM: ABDOMEN - 1 VIEW  COMPARISON:  01/21/2014  FINDINGS: Feeding tube tip is seen with tip in the fundus of the stomach. Some residual contrast is seen within the colon. No dilated bowel loops seen.  IMPRESSION: Feeding tube tip in fundus of stomach.   Electronically Signed   By: Earle Gell M.D.   On: 01/23/2014 18:57   Dg Abd  Portable 1v  01/23/2014   CLINICAL DATA:  Feeding tube placement.  EXAM: PORTABLE ABDOMEN - 1 VIEW  COMPARISON:  01/23/2014  FINDINGS: The Dobbhoff feeding tube is looped back on itself in the descending duodenum. The tip is in the antropyloric region of the stomach.  IMPRESSION: Dobbhoff feeding tube is looped back on itself in the descending duodenum.   Electronically Signed   By: Kalman Jewels M.D.   On: 01/23/2014 23:44   Scheduled: . antiseptic oral rinse  15 mL Mouth Rinse BID  . aspirin  81 mg Oral Daily  . feeding supplement (NEPRO CARB STEADY)  1,000 mL Per Tube Q24H  . insulin aspart  0-24 Units Subcutaneous 6 times per day  . pantoprazole (PROTONIX) IV  40 mg Intravenous Q24H    LOS: 12 days   Estanislado Emms 01/25/2014,8:59 AM

## 2014-01-26 ENCOUNTER — Inpatient Hospital Stay (HOSPITAL_COMMUNITY): Payer: Medicare Other

## 2014-01-26 LAB — GLUCOSE, CAPILLARY
GLUCOSE-CAPILLARY: 141 mg/dL — AB (ref 70–99)
GLUCOSE-CAPILLARY: 131 mg/dL — AB (ref 70–99)
GLUCOSE-CAPILLARY: 164 mg/dL — AB (ref 70–99)
Glucose-Capillary: 138 mg/dL — ABNORMAL HIGH (ref 70–99)
Glucose-Capillary: 117 mg/dL — ABNORMAL HIGH (ref 70–99)

## 2014-01-26 LAB — BASIC METABOLIC PANEL
BUN: 72 mg/dL — ABNORMAL HIGH (ref 6–23)
CO2: 25 mEq/L (ref 19–32)
Calcium: 9.1 mg/dL (ref 8.4–10.5)
Chloride: 92 mEq/L — ABNORMAL LOW (ref 96–112)
Creatinine, Ser: 6.16 mg/dL — ABNORMAL HIGH (ref 0.50–1.35)
GFR calc Af Amer: 9 mL/min — ABNORMAL LOW (ref >90)
GFR, EST NON AFRICAN AMERICAN: 8 mL/min — AB (ref >90)
GLUCOSE: 134 mg/dL — AB (ref 70–99)
Potassium: 3.8 mEq/L (ref 3.7–5.3)
Sodium: 138 mEq/L (ref 137–147)

## 2014-01-26 LAB — CBC
HCT: 24.6 % — ABNORMAL LOW (ref 39.0–52.0)
Hemoglobin: 7.9 g/dL — ABNORMAL LOW (ref 13.0–17.0)
MCH: 28.7 pg (ref 26.0–34.0)
MCHC: 32.1 g/dL (ref 30.0–36.0)
MCV: 89.5 fL (ref 78.0–100.0)
PLATELETS: 394 10*3/uL (ref 150–400)
RBC: 2.75 MIL/uL — AB (ref 4.22–5.81)
RDW: 17.6 % — ABNORMAL HIGH (ref 11.5–15.5)
WBC: 14.3 10*3/uL — ABNORMAL HIGH (ref 4.0–10.5)

## 2014-01-26 MED ORDER — HEPARIN SODIUM (PORCINE) 1000 UNIT/ML DIALYSIS
20.0000 [IU]/kg | INTRAMUSCULAR | Status: DC | PRN
  Filled 2014-01-26: qty 2

## 2014-01-26 MED ORDER — RESOURCE THICKENUP CLEAR PO POWD
ORAL | Status: DC | PRN
  Filled 2014-01-26: qty 125

## 2014-01-26 NOTE — Progress Notes (Signed)
PROGRESS NOTE  [name] DOB: 04/01/1941 DOA: 01/13/2014 PCP: No primary provider on file.    BRIEF PATIENT DESCRIPTION: 73 year old male with HTN and DM presented 5/27 to ED c/o N/V since 5/25. In ED found to be in acute renal failure, hyperkalemic, hypotensive with SBP's in 70s. Started on BiPAP for hypoxia and increased WOB. PCCM asked to admit.   SIGNIFICANT EVENTS:  5/27 admitted  5/28 intubated, shock, pressors, CVVH  5/30 Start pressure support trials  6/1 extubated, off pressors  6/2 D/c'd CRRT  6/3 Improving confusion   STUDIES:  Echo 5/28 >> EF 65 to 87%, grade 2 diastolic dysfx, mod AS  Renal u/s 5/28 >> medical renal disease     Assessment/Plan:  [name] 2/2 ATN related to NSAID, ACEi, hypovolemia, PNA, Septic shock -CRRT 5/28-6/2.  -last HD was 6/8 -nephrology  Anemia: Hb > 8  PNA s/p Zosyn  AMS/Delierium resolved  Leukocytosis -trending doen  DM -SSI -was on metformin at home  Dysphagia Started on diet  Presbyesophagus  Will discussed with Dr. Doran Durand upcoming toe surgery.  Will need to postpone until medically more stable  Acute Hypoxemic Respiratory Failure 2nd to PNA, and pulmonary edema - resolved. Extubated 6/1. 6/02: CXR  greatly improved with better aeration  Septic shock - resolved   NSTEMI likely from demand ischemia   Code Status: full Family Communication: wife Disposition Plan: med surg then SNF once renal status determined   Consultants:  Nephrology  PCCM  PT/OT  SLP      HPI/Subjective: No complaints   Objective: Filed Vitals:   01/26/14 0422  BP: 115/44  Pulse: 78  Temp: 98 F (36.7 C)  Resp: 19    Intake/Output Summary (Last 24 hours) at 01/26/14 1754 Last data filed at 01/26/14 0950  Gross per 24 hour  Intake      0 ml  Output      0 ml  Net      0 ml   Filed Weights   01/25/14 0754 01/25/14 1213 01/25/14 2036  Weight: 78.2 kg (172 lb 6.4 oz) 78.7 kg  (173 lb 8 oz) 79.833 kg (176 lb)    Exam: Neuro: A+Ox4 HEENT: PERRL  Cardiovascular: regular, 2/6 SM  Lungs: Diminished throughout  Abdomen: Soft, non-tender, non-distended  Ext left 3rd toe with eschar and edema. No cellulitis   Data Reviewed: Basic Metabolic Panel:  Recent Labs Lab 01/20/14 0500 01/21/14 0420 01/22/14 0708 01/24/14 0324 01/25/14 0715 01/26/14 0445  NA 141 140 143 141 140 138  K 4.1 3.8 3.9 4.0 4.2 3.8  CL 101 99 96 95* 92* 92*  CO2 22 22 18* 22 18* 25  GLUCOSE 132* 131* 124* 116* 123* 134*  BUN 65* 65* 110* 103* 140* 72*  CREATININE 4.08* 4.68* 7.90* 8.34* 10.45* 6.16*  CALCIUM 8.8 8.5 9.0 9.1 8.9 9.1  MG 3.2*  --   --   --   --   --   PHOS 4.7* 7.1*  --   --   --   --    Liver Function Tests:  Recent Labs Lab 01/20/14 0500 01/21/14 0420  AST  --  52*  ALT  --  39  ALKPHOS  --  92  BILITOT  --  0.6  PROT  --  6.8  ALBUMIN 2.4* 2.6*   No results found for this basename: LIPASE, AMYLASE,  in the last 168 hours No results found for this basename: AMMONIA,  in the last 168 hours CBC:  Recent Labs Lab 01/21/14 0420 01/22/14 0708 01/24/14 0324 01/25/14 0715 01/26/14 0445  WBC 20.6* 20.6* 17.6* 16.1* 14.3*  HGB 9.0* 9.5* 8.6* 8.3* 7.9*  HCT 27.5* 29.7* 26.2* 25.3* 24.6*  MCV 89.0 88.7 88.5 86.1 89.5  PLT 291 377 405* 461* 394   Cardiac Enzymes:  Recent Labs Lab 01/20/14 0500  CKTOTAL 68   BNP (last 3 results)  Recent Labs  01/13/14 2125  PROBNP 35530.0*   CBG:  Recent Labs Lab 01/25/14 2338 01/26/14 0417 01/26/14 0757 01/26/14 1153 01/26/14 1708  GLUCAP 151* 141* 138* 131* 117*    No results found for this or any previous visit (from the past 240 hour(s)).   Studies: Dg Esophagus  01/26/2014   CLINICAL DATA:  Dysphagia. Modified swallow earlier in the morning only clearing patient for nectar thickness.  EXAM: ESOPHOGRAM/BARIUM SWALLOW  TECHNIQUE: Single contrast examination was performed using  thick barium.   FLUOROSCOPY TIME:  3 min and 20 is seconds  COMPARISON:  Chest radiograph 01/19/2014.  FINDINGS: Focused single-contrast exam was performed in a supine, mildly LPO position. The bed was maintained partially upright.  Evaluation of a single swallow demonstrates contrast stasis throughout the mid and upper thoracic esophagus.  Full column evaluation of the esophagus demonstrates no persistent narrowing or stricture.  13 mm barium tablet has initial delay in the vallecula. On subsequent swallows, the tablet passes into the esophagus and then into the stomach.  IMPRESSION: 1. Moderately degraded exam. 2. 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 3. No evidence of  stricture other anatomic cause for dysphagia. 4. Delayed passage of the barium tablet at the level of the vallecula.   Electronically Signed   By: Abigail Miyamoto M.D.   On: 01/26/2014 16:14   Dg Swallowing Func-speech Pathology  01/26/2014   Katherene Ponto Deblois, CCC-SLP     01/26/2014 12:49 PM Objective Swallowing Evaluation: Modified Barium Swallowing Study   Patient Details  Name: [name] MRN: [id number] Date of Birth: July 21, 1941  Today's Date: 01/26/2014 Time: 8315-1761 SLP Time Calculation (min): 19 min  Past Medical History:  Past Medical History  Diagnosis Date  . Diabetes mellitus without complication   . Hypertension   . Hypercholesteremia    Past Surgical History:  Past Surgical History  Procedure Laterality Date  . Prostatectomy    . Colon resection     HPI:  73 year old male with HTN and DM presented 5/27 to ED c/o N/V  since 5/25. In ED found to be in acute renal failure,  hyperkalemic, hypotensive with SBP's in 70s. Started on BiPAP for  hypoxia and increased WOB. Intubated from 5/28 to 6/1. Pt with  ICU delirium. Admission Chest CXR says New confluent bibasilar  pulmonary opacity, nonspecific but consider bilateral pneumonia  or aspiration.      Assessment / Plan / Recommendation Clinical Impression  Dysphagia Diagnosis: Mild  cervical esophageal phase  dysphagia;Suspected primary esophageal dysphagia;Mild pharyngeal  phase dysphagia Clinical impression: Overall strength and timeliness of swallow  has significantly improved though mild to moderate dyspahgia  persists. Reduced hyoid excursion and base of tongue retraction  leads to mild to moderate vallecular residuals that, with thin  liquids, are silently penetrated/aspirated after the swallow.  Quantity and severity of penetration with nectar thick liquids is  trace and is easily cleared with occasional throat clear. The  primary problem is likely more related to cervical esophageal and  possibly esopahgeal function. There is  a prominent CP, reduced  opening of UES and pt was noted to have significant esophageal  stasis building up to proximal esophagus with one instance of  backflow to pharynx. Pt is recommended to consume a dys 3  (mechanical soft) diet with nectar thick liquids, intermittent  throat clear and esophageal precautions. Discussed concerns with  MD. Kerby Moors diet will reduce risk, but still pt may aspirate  esophageal backflow.     Treatment Recommendation  Therapy as outlined in treatment plan below    Diet Recommendation Dysphagia 3 (Mechanical Soft);Nectar-thick  liquid   Liquid Administration via: Cup;Straw Medication Administration: Crushed with puree Supervision: Patient able to self feed;Full supervision/cueing  for compensatory strategies Compensations: Slow rate;Small sips/bites;Clear throat  intermittently;Follow solids with liquid Postural Changes and/or Swallow Maneuvers: Seated upright 90  degrees;Upright 30-60 min after meal;Out of bed for meals    Other  Recommendations Oral Care Recommendations: Oral care BID Other Recommendations: Order thickener from pharmacy   Follow Up Recommendations  Inpatient Rehab    Frequency and Duration min 2x/week  2 weeks   Pertinent Vitals/Pain NA    SLP Swallow Goals     General HPI: 73 year old male with HTN and DM presented  5/27 to  ED c/o N/V since 5/25. In ED found to be in acute renal failure,  hyperkalemic, hypotensive with SBP's in 70s. Started on BiPAP for  hypoxia and increased WOB. Intubated from 5/28 to 6/1. Pt with  ICU delirium. Admission Chest CXR says New confluent bibasilar  pulmonary opacity, nonspecific but consider bilateral pneumonia  or aspiration.  Type of Study: Modified Barium Swallowing Study Reason for Referral: Objectively evaluate swallowing function Previous Swallow Assessment: MBS 01/21/14 - NPO Diet Prior to this Study: NPO;Panda Temperature Spikes Noted: No Respiratory Status: Room air History of Recent Intubation: Yes Length of Intubations (days): 5 days Date extubated: 01/18/14 Behavior/Cognition: Alert;Cooperative;Requires  cueing;Distractible Oral Cavity - Dentition: Dentures, top;Dentures, bottom Oral Motor / Sensory Function: Impaired - see Bedside swallow  eval Self-Feeding Abilities: Able to feed self Patient Positioning: Upright in chair Baseline Vocal Quality: Clear Volitional Cough: Strong Volitional Swallow: Able to elicit Anatomy: Other (Comment) (Protrusion at c5/6, prominent CP) Pharyngeal Secretions: Not observed secondary MBS    Reason for Referral Objectively evaluate swallowing function   Oral Phase Oral Preparation/Oral Phase Oral Phase: WFL   Pharyngeal Phase Pharyngeal Phase Pharyngeal Phase: Impaired Pharyngeal - Honey Pharyngeal - Honey Teaspoon: Reduced epiglottic inversion;Reduced  anterior laryngeal mobility;Reduced laryngeal  elevation;Pharyngeal residue - valleculae;Pharyngeal residue - cp  segment Penetration/Aspiration details (honey teaspoon): Material does  not enter airway Pharyngeal - Nectar Pharyngeal - Nectar Teaspoon: Reduced epiglottic  inversion;Reduced anterior laryngeal mobility;Reduced laryngeal  elevation;Pharyngeal residue - valleculae;Pharyngeal residue - cp  segment Penetration/Aspiration details (nectar teaspoon): Material does  not enter airway Pharyngeal -  Nectar Cup: Reduced epiglottic inversion;Reduced  anterior laryngeal mobility;Reduced laryngeal  elevation;Pharyngeal residue - valleculae;Pharyngeal residue - cp  segment;Penetration/Aspiration after swallow Penetration/Aspiration details (nectar cup): Material enters  airway, remains ABOVE vocal cords and not ejected out;Material  does not enter airway Pharyngeal - Nectar Straw: Reduced epiglottic inversion;Reduced  anterior laryngeal mobility;Reduced laryngeal  elevation;Pharyngeal residue - valleculae;Pharyngeal residue - cp  segment;Penetration/Aspiration after swallow Penetration/Aspiration details (nectar straw): Material enters  airway, remains ABOVE vocal cords and not ejected out;Material  does not enter airway Pharyngeal - Thin Pharyngeal - Thin Teaspoon: Not tested Pharyngeal - Thin Cup: Reduced epiglottic inversion;Reduced  anterior laryngeal mobility;Reduced laryngeal  elevation;Pharyngeal residue - valleculae;Pharyngeal residue -  cp  segment;Penetration/Aspiration after swallow;Trace aspiration Penetration/Aspiration details (thin cup): Material enters  airway, passes BELOW cords without attempt by patient to eject  out (silent aspiration);Material enters airway, CONTACTS cords  and not ejected out Pharyngeal - Thin Straw: Not tested Pharyngeal - Solids Pharyngeal - Puree: Reduced epiglottic inversion;Reduced anterior  laryngeal mobility;Reduced laryngeal elevation;Pharyngeal residue  - valleculae;Pharyngeal residue - cp segment Penetration/Aspiration details (puree): Material does not enter  airway Pharyngeal - Regular: Reduced epiglottic inversion;Reduced  anterior laryngeal mobility;Reduced laryngeal  elevation;Pharyngeal residue - valleculae;Pharyngeal residue - cp  segment Pharyngeal - Pill: Not tested  Cervical Esophageal Phase    GO    Cervical Esophageal Phase Cervical Esophageal Phase: Impaired Cervical Esophageal Phase - Comment Cervical Esophageal Comment: see impression statement         Herbie Baltimore, MA CCC-SLP [phone]  Katherene Ponto Deblois 01/26/2014, 12:48 PM     Scheduled Meds: . antiseptic oral rinse  15 mL Mouth Rinse BID  . aspirin  81 mg Oral Daily  . feeding supplement (NEPRO CARB STEADY)  1,000 mL Per Tube Q24H  . insulin aspart  0-24 Units Subcutaneous 6 times per day  . pantoprazole (PROTONIX) IV  40 mg Intravenous Q24H   Continuous Infusions:   Antibiotics Given (last 72 hours)   None       Time spent: [facility] Pager [phone]. If 7PM-7AM, please contact night-coverage at [email or web address], password [facility] 01/26/2014, 5:54 PM  LOS: 13 days

## 2014-01-26 NOTE — Progress Notes (Signed)
PT Cancellation Note  Patient Details Name: [name] MRN: [id number] DOB: 1941/06/12   Cancelled Treatment:    Reason Eval/Treat Not Completed: Patient declined, no reason specified. Pt refusing out of bed at this time time due to nausea and fatigue. Will re-attempt to see pt at next available time.    [place], [place] 01/26/2014, 3:12 PM

## 2014-01-26 NOTE — Procedures (Signed)
Objective Swallowing Evaluation: Modified Barium Swallowing Study  Patient Details  Name: [name] MRN: [id number] Date of Birth: August 05, 1941  Today's Date: 01/26/2014 Time: 0867-6195 SLP Time Calculation (min): 19 min  Past Medical History:  Past Medical History  Diagnosis Date  . Diabetes mellitus without complication   . Hypertension   . Hypercholesteremia    Past Surgical History:  Past Surgical History  Procedure Laterality Date  . Prostatectomy    . Colon resection     HPI:  73 year old male with HTN and DM presented 5/27 to ED c/o N/V since 5/25. In ED found to be in acute renal failure, hyperkalemic, hypotensive with SBP's in 70s. Started on BiPAP for hypoxia and increased WOB. Intubated from 5/28 to 6/1. Pt with ICU delirium. Admission Chest CXR says New confluent bibasilar pulmonary opacity, nonspecific but consider bilateral pneumonia or aspiration.      Assessment / Plan / Recommendation Clinical Impression  Dysphagia Diagnosis: Mild cervical esophageal phase dysphagia;Suspected primary esophageal dysphagia;Mild pharyngeal phase dysphagia Clinical impression: Overall strength and timeliness of swallow has significantly improved though mild to moderate dyspahgia persists. Reduced hyoid excursion and base of tongue retraction leads to mild to moderate vallecular residuals that, with thin liquids, are silently penetrated/aspirated after the swallow. Quantity and severity of penetration with nectar thick liquids is trace and is easily cleared with occasional throat clear. The primary problem is likely more related to cervical esophageal and possibly esopahgeal function. There is a prominent CP, reduced opening of UES and pt was noted to have significant esophageal stasis building up to proximal esophagus with one instance of backflow to pharynx. Pt is recommended to consume a dys 3 (mechanical soft) diet with nectar thick liquids, intermittent throat clear and esophageal  precautions. Discussed concerns with MD. Kerby Moors diet will reduce risk, but still pt may aspirate esophageal backflow.     Treatment Recommendation  Therapy as outlined in treatment plan below    Diet Recommendation Dysphagia 3 (Mechanical Soft);Nectar-thick liquid   Liquid Administration via: Cup;Straw Medication Administration: Crushed with puree Supervision: Patient able to self feed;Full supervision/cueing for compensatory strategies Compensations: Slow rate;Small sips/bites;Clear throat intermittently;Follow solids with liquid Postural Changes and/or Swallow Maneuvers: Seated upright 90 degrees;Upright 30-60 min after meal;Out of bed for meals    Other  Recommendations Oral Care Recommendations: Oral care BID Other Recommendations: Order thickener from pharmacy   Follow Up Recommendations  Inpatient Rehab    Frequency and Duration min 2x/week  2 weeks   Pertinent Vitals/Pain NA    SLP Swallow Goals     General HPI: 73 year old male with HTN and DM presented 5/27 to ED c/o N/V since 5/25. In ED found to be in acute renal failure, hyperkalemic, hypotensive with SBP's in 70s. Started on BiPAP for hypoxia and increased WOB. Intubated from 5/28 to 6/1. Pt with ICU delirium. Admission Chest CXR says New confluent bibasilar pulmonary opacity, nonspecific but consider bilateral pneumonia or aspiration.  Type of Study: Modified Barium Swallowing Study Reason for Referral: Objectively evaluate swallowing function Previous Swallow Assessment: MBS 01/21/14 - NPO Diet Prior to this Study: NPO;Panda Temperature Spikes Noted: No Respiratory Status: Room air History of Recent Intubation: Yes Length of Intubations (days): 5 days Date extubated: 01/18/14 Behavior/Cognition: Alert;Cooperative;Requires cueing;Distractible Oral Cavity - Dentition: Dentures, top;Dentures, bottom Oral Motor / Sensory Function: Impaired - see Bedside swallow eval Self-Feeding Abilities: Able to feed self Patient  Positioning: Upright in chair Baseline Vocal Quality: Clear Volitional Cough: Strong Volitional  Swallow: Able to elicit Anatomy: Other (Comment) (Protrusion at c5/6, prominent CP) Pharyngeal Secretions: Not observed secondary MBS    Reason for Referral Objectively evaluate swallowing function   Oral Phase Oral Preparation/Oral Phase Oral Phase: WFL   Pharyngeal Phase Pharyngeal Phase Pharyngeal Phase: Impaired Pharyngeal - Honey Pharyngeal - Honey Teaspoon: Reduced epiglottic inversion;Reduced anterior laryngeal mobility;Reduced laryngeal elevation;Pharyngeal residue - valleculae;Pharyngeal residue - cp segment Penetration/Aspiration details (honey teaspoon): Material does not enter airway Pharyngeal - Nectar Pharyngeal - Nectar Teaspoon: Reduced epiglottic inversion;Reduced anterior laryngeal mobility;Reduced laryngeal elevation;Pharyngeal residue - valleculae;Pharyngeal residue - cp segment Penetration/Aspiration details (nectar teaspoon): Material does not enter airway Pharyngeal - Nectar Cup: Reduced epiglottic inversion;Reduced anterior laryngeal mobility;Reduced laryngeal elevation;Pharyngeal residue - valleculae;Pharyngeal residue - cp segment;Penetration/Aspiration after swallow Penetration/Aspiration details (nectar cup): Material enters airway, remains ABOVE vocal cords and not ejected out;Material does not enter airway Pharyngeal - Nectar Straw: Reduced epiglottic inversion;Reduced anterior laryngeal mobility;Reduced laryngeal elevation;Pharyngeal residue - valleculae;Pharyngeal residue - cp segment;Penetration/Aspiration after swallow Penetration/Aspiration details (nectar straw): Material enters airway, remains ABOVE vocal cords and not ejected out;Material does not enter airway Pharyngeal - Thin Pharyngeal - Thin Teaspoon: Not tested Pharyngeal - Thin Cup: Reduced epiglottic inversion;Reduced anterior laryngeal mobility;Reduced laryngeal elevation;Pharyngeal residue -  valleculae;Pharyngeal residue - cp segment;Penetration/Aspiration after swallow;Trace aspiration Penetration/Aspiration details (thin cup): Material enters airway, passes BELOW cords without attempt by patient to eject out (silent aspiration);Material enters airway, CONTACTS cords and not ejected out Pharyngeal - Thin Straw: Not tested Pharyngeal - Solids Pharyngeal - Puree: Reduced epiglottic inversion;Reduced anterior laryngeal mobility;Reduced laryngeal elevation;Pharyngeal residue - valleculae;Pharyngeal residue - cp segment Penetration/Aspiration details (puree): Material does not enter airway Pharyngeal - Regular: Reduced epiglottic inversion;Reduced anterior laryngeal mobility;Reduced laryngeal elevation;Pharyngeal residue - valleculae;Pharyngeal residue - cp segment Pharyngeal - Pill: Not tested  Cervical Esophageal Phase    GO    Cervical Esophageal Phase Cervical Esophageal Phase: Impaired Cervical Esophageal Phase - Comment Cervical Esophageal Comment: see impression statement        Herbie Baltimore, MA CCC-SLP [phone]  Katherene Ponto Tasman Zapata 01/26/2014, 12:48 PM

## 2014-01-26 NOTE — Progress Notes (Signed)
Pt back from barium swallow via bed.

## 2014-01-26 NOTE — Clinical Social Work Note (Addendum)
CSW talked further with patient and wife [name] regarding SNF placement. Their preference is [facility]. CSW faxed out to facilities in [facility].  Leary Mcnulty Givens, MSW, LCSW [phone]

## 2014-01-26 NOTE — Progress Notes (Signed)
Pulled NG tube pt tolerated well.

## 2014-01-26 NOTE — Progress Notes (Signed)
OT Cancellation Note  Patient Details Name: [name] MRN: [id number] DOB: 08-22-1940   Cancelled Treatment:    Reason Eval/Treat Not Completed: Fatigue/lethargy limiting ability to participate. Pt and wife state that they also have family coming to visit form out of town later this afternoon and requests OT return tomorrow  Mosetta Putt 01/26/2014, 2:18 PM

## 2014-01-26 NOTE — Progress Notes (Signed)
Assessment  1. [name] 2/2 ATN related to NSAID, ACEi, hypovolemia, PNA, Septic shock: CRRT 5/28-6/2. Anticipate recovery 2. Anemia: Hb 7.9 today  Plan Will dose dialysis PRN,follow labs  Subjective: Interval History: Some urinary leakage this AM  Objective: Vital signs in last 24 hours: Temp:  [97.8 F (36.6 C)-98.9 F (37.2 C)] 98 F (36.7 C) (06/09 0422) Pulse Rate:  [78-92] 78 (06/09 0422) Resp:  [16-20] 19 (06/09 0422) BP: (86-120)/(44-58) 115/44 mmHg (06/09 0422) SpO2:  [95 %-99 %] 98 % (06/09 0422) Weight:  [78.7 kg (173 lb 8 oz)-79.833 kg (176 lb)] 79.833 kg (176 lb) (06/08 2036) Weight change: -2 kg (-4 lb 6.5 oz)  Intake/Output from previous day: 06/08 0701 - 06/09 0700 In: 2030.5 [NG/GT:2030.5] Out: -598  Intake/Output this shift:    General appearance: alert and cooperative Resp: clear to auscultation bilaterally Cardio: regular rate and rhythm, S1, S2 normal, no murmur, click, rub or gallop Extremities: extremities normal, atraumatic, no cyanosis or edema  Lab Results:  Recent Labs  01/25/14 0715 01/26/14 0445  WBC 16.1* 14.3*  HGB 8.3* 7.9*  HCT 25.3* 24.6*  PLT 461* 394   BMET:  Recent Labs  01/25/14 0715 01/26/14 0445  NA 140 138  K 4.2 3.8  CL 92* 92*  CO2 18* 25  GLUCOSE 123* 134*  BUN 140* 72*  CREATININE 10.45* 6.16*  CALCIUM 8.9 9.1   No results found for this basename: PTH,  in the last 72 hours Iron Studies: No results found for this basename: IRON, TIBC, TRANSFERRIN, FERRITIN,  in the last 72 hours Studies/Results: No results found.  Scheduled: . antiseptic oral rinse  15 mL Mouth Rinse BID  . aspirin  81 mg Oral Daily  . feeding supplement (NEPRO CARB STEADY)  1,000 mL Per Tube Q24H  . insulin aspart  0-24 Units Subcutaneous 6 times per day  . pantoprazole (PROTONIX) IV  40 mg Intravenous Q24H    LOS: 13 days   Estanislado Emms 01/26/2014,11:18 AM

## 2014-01-27 LAB — GLUCOSE, CAPILLARY
GLUCOSE-CAPILLARY: 105 mg/dL — AB (ref 70–99)
GLUCOSE-CAPILLARY: 141 mg/dL — AB (ref 70–99)
GLUCOSE-CAPILLARY: 159 mg/dL — AB (ref 70–99)
Glucose-Capillary: 125 mg/dL — ABNORMAL HIGH (ref 70–99)
Glucose-Capillary: 115 mg/dL — ABNORMAL HIGH (ref 70–99)
Glucose-Capillary: 173 mg/dL — ABNORMAL HIGH (ref 70–99)

## 2014-01-27 LAB — BASIC METABOLIC PANEL
BUN: 103 mg/dL — AB (ref 6–23)
CO2: 23 mEq/L (ref 19–32)
Calcium: 9.1 mg/dL (ref 8.4–10.5)
Chloride: 94 mEq/L — ABNORMAL LOW (ref 96–112)
Creatinine, Ser: 8.46 mg/dL — ABNORMAL HIGH (ref 0.50–1.35)
GFR calc Af Amer: 6 mL/min — ABNORMAL LOW (ref >90)
GFR, EST NON AFRICAN AMERICAN: 6 mL/min — AB (ref >90)
GLUCOSE: 114 mg/dL — AB (ref 70–99)
Potassium: 5 mEq/L (ref 3.7–5.3)
Sodium: 142 mEq/L (ref 137–147)

## 2014-01-27 MED ORDER — ENSURE PUDDING PO PUDG: 1.0000 | Freq: Two times a day (BID) | ORAL | Status: DC

## 2014-01-27 MED ORDER — PANTOPRAZOLE SODIUM 40 MG PO TBEC
40.0000 mg | DELAYED_RELEASE_TABLET | Freq: Every day | ORAL | Status: DC
  Administered 2014-01-28 – 2014-02-11 (×14): 40 mg via ORAL
  Filled 2014-01-27 (×12): qty 1

## 2014-01-27 NOTE — Progress Notes (Signed)
Bladder scan 153 ml urine.

## 2014-01-27 NOTE — Progress Notes (Signed)
NUTRITION FOLLOW UP  Intervention:   Add Ensure Pudding po BID, each supplement provides 170 kcal and 4 grams of protein. RD to continue to follow nutrition care plan.  Nutrition Dx:   Inadequate oral intake now related to swallowing difficulty as evidenced by limited intake.  Goal:   Intake to meet >90% of estimated nutrition needs. Met.  Monitor:   weight trends, lab trends, I/O's, PO intake, supplement tolerance  Assessment:   73 year old male with HTN and DM presented 5/27 to ED c/o N/V since 5/25. In ED found to be in acute renal failure, hyperkalemic, hypotensive with SBP's in 70s. Started on BiPAP for hypoxia and increased WOB. Required intubation on 5/28.  Patient was extubated on 6/1. CRRT off since 6/2. S/P multiple swallow evaluations with SLP; S/P MBS 6/4 - pt was found to not be ready for diet advancement at this time. Nepro enteral nutrition initiated via NGT on 6/4. Underwent additional MBSS on 6/9, and pt with Dysphagia 3 with Nectar Thickened Liquids. Eating at least 50% of his meals.  Renal continues to follow patient and assess need for HD daily. Most recent renal note reports that team is anticipating renal recovery.  CBG's: 105 - 125 Sodium and potassium WNL Most recent phosphorus elevated at 7.1  Height: Ht Readings from Last 1 Encounters:  01/14/14 _0  (1.803 m)    Weight Status:  Trending down with fluid removal. Wt Readings from Last 1 Encounters:  01/26/14 179 lb 0.2 oz (81.2 kg)  78.7 kg s/p HD 6/8 79.5 kg s/p HD 6/6  Admission weight: 87.1 kg  Body mass index is 24.98 kg/(m^2). WNL  Re-estimated needs:  Kcal: 2000-2300 Protein: 95-110 gm Fluid: 1.2 L  Skin: stage I to R sacrum  Diet Order: Dysphagia 3; nectar thick liquids   Intake/Output Summary (Last 24 hours) at 01/27/14 1440 Last data filed at 01/27/14 1024  Gross per 24 hour  Intake    240 ml  Output    350 ml  Net   -110 ml    Last BM: 6/9 -  diarrhea   Labs:   Recent Labs Lab 01/21/14 0420  01/25/14 0715 01/26/14 0445 01/27/14 0620  NA 140  < > 140 138 142  K 3.8  < > 4.2 3.8 5.0  CL 99  < > 92* 92* 94*  CO2 22  < > 18* 25 23  BUN 65*  < > 140* 72* 103*  CREATININE 4.68*  < > 10.45* 6.16* 8.46*  CALCIUM 8.5  < > 8.9 9.1 9.1  PHOS 7.1*  --   --   --   --   GLUCOSE 131*  < > 123* 134* 114*  < > = values in this interval not displayed.  CBG (last 3)   Recent Labs  01/27/14 0427 01/27/14 0722 01/27/14 1234  GLUCAP 105* 159* 125*    Scheduled Meds: . antiseptic oral rinse  15 mL Mouth Rinse BID  . aspirin  81 mg Oral Daily  . feeding supplement (NEPRO CARB STEADY)  1,000 mL Per Tube Q24H  . insulin aspart  0-24 Units Subcutaneous 6 times per day  . [START ON 01/28/2014] pantoprazole  40 mg Oral QAC breakfast    Continuous Infusions:    Inda Coke MS, RD, LDN Inpatient Registered Dietitian Pager: [phone] After-hours pager: [phone]

## 2014-01-27 NOTE — Progress Notes (Signed)
Speech Language Pathology Treatment:    Patient Details Name: [name] MRN: [id number] DOB: Dec 18, 1940 Today's Date: 01/27/2014 Time: 0814-4818 SLP Time Calculation (min): 22 min  Assessment / Plan / Recommendation Clinical Impression  Pt seen today for skilled dysphagia treatment including reinforcement of effective compensation strategies to mitgate aspiration risk.  Pt consumed nearly half of breakfast tray prior to slp arrival.  He admits his swallow ability is not as good as prior to this hospitalization.    SLP reviewed compensation strategies with pt including clinical reasoning and providing visual reminders within pt's view.  Initially pt required total assist to state precautions but later required mod I.  Recommend continue full supervision for maximal airway protection.  Further recommend pt eat several small meals due to poor esophageal clearance.  Pt without indications of aspiration with minimal po observed.    Continue dys3/nectar and speech path follow up.  Hopeful for pt diet to be able to be advanced in future to allow thin liquids.     HPI HPI: 73 year old male with HTN and DM presented 5/27 to ED c/o N/V since 5/25. In ED found to be in acute renal failure, hyperkalemic, hypotensive with SBP's in 70s. Started on BiPAP for hypoxia and increased WOB. Intubated from 5/28 to 6/1. Pt with ICU delirium. Admission Chest CXR says New confluent bibasilar pulmonary opacity, nonspecific but consider bilateral pneumonia or aspiration. Pt has undergone multiple MBS studies with yesterday's study showing improved swallow function to allow po intake with strict precautions.     Pertinent Vitals Afebrile, decreased  SLP Plan  Continue with current plan of care    Recommendations Diet recommendations: Dysphagia 3 (mechanical soft);Nectar-thick liquid Liquids provided via: Cup Medication Administration: Crushed with puree Supervision: Patient able to self feed;Full supervision/cueing  for compensatory strategies Compensations: Slow rate;Small sips/bites;Clear throat intermittently;Follow solids with liquid Postural Changes and/or Swallow Maneuvers: Seated upright 90 degrees;Upright 30-60 min after meal;Out of bed for meals              Oral Care Recommendations: Oral care BID Follow up Recommendations: Inpatient Rehab Plan: Continue with current plan of care    [place], [place], [place] [facility] SLP [phone]

## 2014-01-27 NOTE — Progress Notes (Signed)
Assessment  1. [name] 2/2 ATN related to NSAID, ACEi, hypovolemia, PNA, Septic shock: CRRT 5/28-6/2. Anticipate recovery 2. Anemia: Hb 7.9  Plan Bladder scan to r/o retention; hold on HD today  Subjective: Interval History: He reports some urinary incontinence and prior BPH s/s  Objective: Vital signs in last 24 hours: Temp:  [97.4 F (36.3 C)-98.4 F (36.9 C)] 98.4 F (36.9 C) (06/10 0612) Pulse Rate:  [78-82] 78 (06/10 0612) Resp:  [18-20] 18 (06/10 0612) BP: (128-134)/(50-71) 128/62 mmHg (06/10 0612) SpO2:  [92 %-98 %] 97 % (06/10 0612) Weight:  [81.2 kg (179 lb 0.2 oz)] 81.2 kg (179 lb 0.2 oz) (06/09 2125) Weight change: 3 kg (6 lb 9.8 oz)  Intake/Output from previous day:   Intake/Output this shift:    Head: Normocephalic, without obvious abnormality, atraumatic Chest wall: no tenderness Cardio: regular rate and rhythm, S1, S2 normal, no murmur, click, rub or gallop GI: soft, non-tender; bowel sounds normal; no masses,  no organomegaly Extremities: extremities normal, atraumatic, no cyanosis or edema  Lab Results:  Recent Labs  01/25/14 0715 01/26/14 0445  WBC 16.1* 14.3*  HGB 8.3* 7.9*  HCT 25.3* 24.6*  PLT 461* 394   BMET:  Recent Labs  01/26/14 0445 01/27/14 0620  NA 138 142  K 3.8 5.0  CL 92* 94*  CO2 25 23  GLUCOSE 134* 114*  BUN 72* 103*  CREATININE 6.16* 8.46*  CALCIUM 9.1 9.1   No results found for this basename: PTH,  in the last 72 hours Iron Studies: No results found for this basename: IRON, TIBC, TRANSFERRIN, FERRITIN,  in the last 72 hours Studies/Results: Dg Esophagus  01/26/2014   CLINICAL DATA:  Dysphagia. Modified swallow earlier in the morning only clearing patient for nectar thickness.  EXAM: ESOPHOGRAM/BARIUM SWALLOW  TECHNIQUE: Single contrast examination was performed using  thick barium.  FLUOROSCOPY TIME:  3 min and 20 is seconds  COMPARISON:  Chest radiograph 01/19/2014.  FINDINGS: Focused single-contrast  exam was performed in a supine, mildly LPO position. The bed was maintained partially upright.  Evaluation of a single swallow demonstrates contrast stasis throughout the mid and upper thoracic esophagus.  Full column evaluation of the esophagus demonstrates no persistent narrowing or stricture.  13 mm barium tablet has initial delay in the vallecula. On subsequent swallows, the tablet passes into the esophagus and then into the stomach.  IMPRESSION: 1. Moderately degraded exam. 2. 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 3. No evidence of  stricture other anatomic cause for dysphagia. 4. Delayed passage of the barium tablet at the level of the vallecula.   Electronically Signed   By: Abigail Miyamoto M.D.   On: 01/26/2014 16:14   Dg Swallowing Func-speech Pathology  01/26/2014   Katherene Ponto Deblois, CCC-SLP     01/26/2014 12:49 PM Objective Swallowing Evaluation: Modified Barium Swallowing Study   Patient Details  Name: [name] MRN: [id number] Date of Birth: 01-25-1941  Today's Date: 01/26/2014 Time: 0350-0938 SLP Time Calculation (min): 19 min  Past Medical History:  Past Medical History  Diagnosis Date  . Diabetes mellitus without complication   . Hypertension   . Hypercholesteremia    Past Surgical History:  Past Surgical History  Procedure Laterality Date  . Prostatectomy    . Colon resection     HPI:  73 year old male with HTN and DM presented 5/27 to ED c/o N/V  since 5/25. In ED found to be in acute renal failure,  hyperkalemic, hypotensive with SBP's in 70s. Started on BiPAP for  hypoxia and increased WOB. Intubated from 5/28 to 6/1. Pt with  ICU delirium. Admission Chest CXR says New confluent bibasilar  pulmonary opacity, nonspecific but consider bilateral pneumonia  or aspiration.      Assessment / Plan / Recommendation Clinical Impression  Dysphagia Diagnosis: Mild cervical esophageal phase  dysphagia;Suspected primary esophageal dysphagia;Mild pharyngeal  phase dysphagia Clinical  impression: Overall strength and timeliness of swallow  has significantly improved though mild to moderate dyspahgia  persists. Reduced hyoid excursion and base of tongue retraction  leads to mild to moderate vallecular residuals that, with thin  liquids, are silently penetrated/aspirated after the swallow.  Quantity and severity of penetration with nectar thick liquids is  trace and is easily cleared with occasional throat clear. The  primary problem is likely more related to cervical esophageal and  possibly esopahgeal function. There is a prominent CP, reduced  opening of UES and pt was noted to have significant esophageal  stasis building up to proximal esophagus with one instance of  backflow to pharynx. Pt is recommended to consume a dys 3  (mechanical soft) diet with nectar thick liquids, intermittent  throat clear and esophageal precautions. Discussed concerns with  MD. Kerby Moors diet will reduce risk, but still pt may aspirate  esophageal backflow.     Treatment Recommendation  Therapy as outlined in treatment plan below    Diet Recommendation Dysphagia 3 (Mechanical Soft);Nectar-thick  liquid   Liquid Administration via: Cup;Straw Medication Administration: Crushed with puree Supervision: Patient able to self feed;Full supervision/cueing  for compensatory strategies Compensations: Slow rate;Small sips/bites;Clear throat  intermittently;Follow solids with liquid Postural Changes and/or Swallow Maneuvers: Seated upright 90  degrees;Upright 30-60 min after meal;Out of bed for meals    Other  Recommendations Oral Care Recommendations: Oral care BID Other Recommendations: Order thickener from pharmacy   Follow Up Recommendations  Inpatient Rehab    Frequency and Duration min 2x/week  2 weeks   Pertinent Vitals/Pain NA    SLP Swallow Goals     General HPI: 73 year old male with HTN and DM presented 5/27 to  ED c/o N/V since 5/25. In ED found to be in acute renal failure,  hyperkalemic, hypotensive with SBP's in  70s. Started on BiPAP for  hypoxia and increased WOB. Intubated from 5/28 to 6/1. Pt with  ICU delirium. Admission Chest CXR says New confluent bibasilar  pulmonary opacity, nonspecific but consider bilateral pneumonia  or aspiration.  Type of Study: Modified Barium Swallowing Study Reason for Referral: Objectively evaluate swallowing function Previous Swallow Assessment: MBS 01/21/14 - NPO Diet Prior to this Study: NPO;Panda Temperature Spikes Noted: No Respiratory Status: Room air History of Recent Intubation: Yes Length of Intubations (days): 5 days Date extubated: 01/18/14 Behavior/Cognition: Alert;Cooperative;Requires  cueing;Distractible Oral Cavity - Dentition: Dentures, top;Dentures, bottom Oral Motor / Sensory Function: Impaired - see Bedside swallow  eval Self-Feeding Abilities: Able to feed self Patient Positioning: Upright in chair Baseline Vocal Quality: Clear Volitional Cough: Strong Volitional Swallow: Able to elicit Anatomy: Other (Comment) (Protrusion at c5/6, prominent CP) Pharyngeal Secretions: Not observed secondary MBS    Reason for Referral Objectively evaluate swallowing function   Oral Phase Oral Preparation/Oral Phase Oral Phase: WFL   Pharyngeal Phase Pharyngeal Phase Pharyngeal Phase: Impaired Pharyngeal - Honey Pharyngeal - Honey Teaspoon: Reduced epiglottic inversion;Reduced  anterior laryngeal mobility;Reduced laryngeal  elevation;Pharyngeal residue - valleculae;Pharyngeal residue - cp  segment Penetration/Aspiration details (honey teaspoon): Material does  not enter airway Pharyngeal - Nectar Pharyngeal - Nectar Teaspoon: Reduced epiglottic  inversion;Reduced anterior laryngeal mobility;Reduced laryngeal  elevation;Pharyngeal residue - valleculae;Pharyngeal residue - cp  segment Penetration/Aspiration details (nectar teaspoon): Material does  not enter airway Pharyngeal - Nectar Cup: Reduced epiglottic inversion;Reduced  anterior laryngeal mobility;Reduced laryngeal  elevation;Pharyngeal  residue - valleculae;Pharyngeal residue - cp  segment;Penetration/Aspiration after swallow Penetration/Aspiration details (nectar cup): Material enters  airway, remains ABOVE vocal cords and not ejected out;Material  does not enter airway Pharyngeal - Nectar Straw: Reduced epiglottic inversion;Reduced  anterior laryngeal mobility;Reduced laryngeal  elevation;Pharyngeal residue - valleculae;Pharyngeal residue - cp  segment;Penetration/Aspiration after swallow Penetration/Aspiration details (nectar straw): Material enters  airway, remains ABOVE vocal cords and not ejected out;Material  does not enter airway Pharyngeal - Thin Pharyngeal - Thin Teaspoon: Not tested Pharyngeal - Thin Cup: Reduced epiglottic inversion;Reduced  anterior laryngeal mobility;Reduced laryngeal  elevation;Pharyngeal residue - valleculae;Pharyngeal residue - cp  segment;Penetration/Aspiration after swallow;Trace aspiration Penetration/Aspiration details (thin cup): Material enters  airway, passes BELOW cords without attempt by patient to eject  out (silent aspiration);Material enters airway, CONTACTS cords  and not ejected out Pharyngeal - Thin Straw: Not tested Pharyngeal - Solids Pharyngeal - Puree: Reduced epiglottic inversion;Reduced anterior  laryngeal mobility;Reduced laryngeal elevation;Pharyngeal residue  - valleculae;Pharyngeal residue - cp segment Penetration/Aspiration details (puree): Material does not enter  airway Pharyngeal - Regular: Reduced epiglottic inversion;Reduced  anterior laryngeal mobility;Reduced laryngeal  elevation;Pharyngeal residue - valleculae;Pharyngeal residue - cp  segment Pharyngeal - Pill: Not tested  Cervical Esophageal Phase    GO    Cervical Esophageal Phase Cervical Esophageal Phase: Impaired Cervical Esophageal Phase - Comment Cervical Esophageal Comment: see impression statement        Herbie [place], MA CCC-SLP [phone]  Katherene Ponto Deblois 01/26/2014, 12:48 PM    Scheduled: . antiseptic oral rinse   15 mL Mouth Rinse BID  . aspirin  81 mg Oral Daily  . feeding supplement (NEPRO CARB STEADY)  1,000 mL Per Tube Q24H  . insulin aspart  0-24 Units Subcutaneous 6 times per day  . pantoprazole (PROTONIX) IV  40 mg Intravenous Q24H     LOS: 14 days   Tillmon Kisling C 01/27/2014,9:11 AM

## 2014-01-27 NOTE — Progress Notes (Signed)
Ot worked with patient. Pt gave bath to himself with assist up to chair 2 person assist.

## 2014-01-27 NOTE — Progress Notes (Signed)
PHARMACIST - PHYSICIAN COMMUNICATION DR:   Conley Canal CONCERNING: Protonix IV to Oral Route Change Policy  RECOMMENDATION: This patient is receiving Protonix by the intravenous route.  Based on criteria approved by the Pharmacy and Therapeutics Committee, this drug is being converted to the equivalent oral dose form(s).  DESCRIPTION: These criteria include:  The patient is eating (either orally or via tube) and/or has been taking other orally administered medications for a least 24 hours  There is no active GI bleed or impaired GI absorption noted.   If you have questions about this conversion, please contact the Pharmacy Department  []   [phone] )  [facility] [x]   [phone] )  [facility]  []   [phone] )  [facility] []   [phone] )  [facility]

## 2014-01-27 NOTE — Progress Notes (Signed)
Paged Dr. Conley Canal 153 ml urine in bladder.

## 2014-01-27 NOTE — Progress Notes (Signed)
PROGRESS NOTE  [name] BZJ:[id number] DOB: 1941/07/04 DOA: 01/13/2014 PCP: No primary provider on file.    BRIEF PATIENT DESCRIPTION: 73 year old male with HTN and DM presented 5/27 to ED c/o N/V since 5/25. In ED found to be in acute renal failure, hyperkalemic, hypotensive with SBP's in 70s. Started on BiPAP for hypoxia and increased WOB. PCCM asked to admit.   SIGNIFICANT EVENTS:  5/27 admitted  5/28 intubated, shock, pressors, CVVH  5/30 Start pressure support trials  6/1 extubated, off pressors  6/2 D/c'd CRRT  6/3 Improving confusion   STUDIES:  Echo 5/28 >> EF 65 to 01%, grade 2 diastolic dysfx, mod AS  Renal u/s 5/28 >> medical renal disease     Assessment/Plan:  [name] 2/2 ATN related to NSAID, ACEi, hypovolemia, PNA, Septic shock -CRRT 5/28-6/2.  -last HD was 6/8 -nephrology  Anemia: Hb > 8  PNA s/p Zosyn  AMS/Delierium resolved  Leukocytosis -trending doen  DM -SSI -was on metformin at home  Dysphagia Started on diet  Presbyesophagus  Left message with Dr. Doran Durand upcoming toe surgery.  Will need to postpone until medically more stable  Acute Hypoxemic Respiratory Failure 2nd to PNA, and pulmonary edema - resolved. Extubated 6/1. 6/02: CXR  greatly improved with better aeration  Septic shock - resolved   NSTEMI likely from demand ischemia   Code Status: full Family Communication: wife 6/9 Disposition Plan: med surg then SNF once renal status determined   Consultants:  Nephrology  PCCM  PT/OT  SLP   HPI/Subjective: Tired from being oob. Tolerating diet   Objective: Filed Vitals:   01/27/14 0955  BP: 110/50  Pulse: 63  Temp: 98.4 F (36.9 C)  Resp: 18    Intake/Output Summary (Last 24 hours) at 01/27/14 1416 Last data filed at 01/27/14 1024  Gross per 24 hour  Intake    240 ml  Output    350 ml  Net   -110 ml   Filed Weights   01/25/14 1213 01/25/14 2036 01/26/14 2125  Weight: 78.7 kg  (173 lb 8 oz) 79.833 kg (176 lb) 81.2 kg (179 lb 0.2 oz)    Exam: Neuro: A+Ox4 HEENT: PERRL  Cardiovascular: regular, 2/6 SM  Lungs: Diminished throughout  Abdomen: Soft, non-tender, non-distended  Ext left 3rd toe with eschar and edema. No cellulitis   Data Reviewed: Basic Metabolic Panel:  Recent Labs Lab 01/21/14 0420 01/22/14 0708 01/24/14 0324 01/25/14 0715 01/26/14 0445 01/27/14 0620  NA 140 143 141 140 138 142  K 3.8 3.9 4.0 4.2 3.8 5.0  CL 99 96 95* 92* 92* 94*  CO2 22 18* 22 18* 25 23  GLUCOSE 131* 124* 116* 123* 134* 114*  BUN 65* 110* 103* 140* 72* 103*  CREATININE 4.68* 7.90* 8.34* 10.45* 6.16* 8.46*  CALCIUM 8.5 9.0 9.1 8.9 9.1 9.1  PHOS 7.1*  --   --   --   --   --    Liver Function Tests:  Recent Labs Lab 01/21/14 0420  AST 52*  ALT 39  ALKPHOS 92  BILITOT 0.6  PROT 6.8  ALBUMIN 2.6*   No results found for this basename: LIPASE, AMYLASE,  in the last 168 hours No results found for this basename: AMMONIA,  in the last 168 hours CBC:  Recent Labs Lab 01/21/14 0420 01/22/14 0708 01/24/14 0324 01/25/14 0715 01/26/14 0445  WBC 20.6* 20.6* 17.6* 16.1* 14.3*  HGB 9.0* 9.5* 8.6* 8.3* 7.9*  HCT 27.5* 29.7*  26.2* 25.3* 24.6*  MCV 89.0 88.7 88.5 86.1 89.5  PLT 291 377 405* 461* 394   Cardiac Enzymes: No results found for this basename: CKTOTAL, CKMB, CKMBINDEX, TROPONINI,  in the last 168 hours BNP (last 3 results)  Recent Labs  01/13/14 2125  PROBNP 35530.0*   CBG:  Recent Labs Lab 01/26/14 2019 01/27/14 0019 01/27/14 0427 01/27/14 0722 01/27/14 1234  GLUCAP 164* 141* 105* 159* 125*    No results found for this or any previous visit (from the past 240 hour(s)).   Studies: Dg Esophagus  01/26/2014   CLINICAL DATA:  Dysphagia. Modified swallow earlier in the morning only clearing patient for nectar thickness.  EXAM: ESOPHOGRAM/BARIUM SWALLOW  TECHNIQUE: Single contrast examination was performed using  thick barium.  FLUOROSCOPY  TIME:  3 min and 20 is seconds  COMPARISON:  Chest radiograph 01/19/2014.  FINDINGS: Focused single-contrast exam was performed in a supine, mildly LPO position. The bed was maintained partially upright.  Evaluation of a single swallow demonstrates contrast stasis throughout the mid and upper thoracic esophagus.  Full column evaluation of the esophagus demonstrates no persistent narrowing or stricture.  13 mm barium tablet has initial delay in the vallecula. On subsequent swallows, the tablet passes into the esophagus and then into the stomach.  IMPRESSION: 1. Moderately degraded exam. 2. 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 3. No evidence of  stricture other anatomic cause for dysphagia. 4. Delayed passage of the barium tablet at the level of the vallecula.   Electronically Signed   By: Abigail Miyamoto M.D.   On: 01/26/2014 16:14   Dg Swallowing Func-speech Pathology  01/26/2014   Katherene Ponto Deblois, CCC-SLP     01/26/2014 12:49 PM Objective Swallowing Evaluation: Modified Barium Swallowing Study   Patient Details  Name: [name] MRN: [id number] Date of Birth: [birth year]  Today's Date: 01/26/2014 Time: 1761-6073 SLP Time Calculation (min): 19 min  Past Medical History:  Past Medical History  Diagnosis Date  . Diabetes mellitus without complication   . Hypertension   . Hypercholesteremia    Past Surgical History:  Past Surgical History  Procedure Laterality Date  . Prostatectomy    . Colon resection     HPI:  73 year old male with HTN and DM presented 5/27 to ED c/o N/V  since 5/25. In ED found to be in acute renal failure,  hyperkalemic, hypotensive with SBP's in 70s. Started on BiPAP for  hypoxia and increased WOB. Intubated from 5/28 to 6/1. Pt with  ICU delirium. Admission Chest CXR says New confluent bibasilar  pulmonary opacity, nonspecific but consider bilateral pneumonia  or aspiration.      Assessment / Plan / Recommendation Clinical Impression  Dysphagia Diagnosis: Mild cervical  esophageal phase  dysphagia;Suspected primary esophageal dysphagia;Mild pharyngeal  phase dysphagia Clinical impression: Overall strength and timeliness of swallow  has significantly improved though mild to moderate dyspahgia  persists. Reduced hyoid excursion and base of tongue retraction  leads to mild to moderate vallecular residuals that, with thin  liquids, are silently penetrated/aspirated after the swallow.  Quantity and severity of penetration with nectar thick liquids is  trace and is easily cleared with occasional throat clear. The  primary problem is likely more related to cervical esophageal and  possibly esopahgeal function. There is a prominent CP, reduced  opening of UES and pt was noted to have significant esophageal  stasis building up to proximal esophagus with one instance of  backflow to pharynx. Pt is  recommended to consume a dys 3  (mechanical soft) diet with nectar thick liquids, intermittent  throat clear and esophageal precautions. Discussed concerns with  MD. Kerby Moors diet will reduce risk, but still pt may aspirate  esophageal backflow.     Treatment Recommendation  Therapy as outlined in treatment plan below    Diet Recommendation Dysphagia 3 (Mechanical Soft);Nectar-thick  liquid   Liquid Administration via: Cup;Straw Medication Administration: Crushed with puree Supervision: Patient able to self feed;Full supervision/cueing  for compensatory strategies Compensations: Slow rate;Small sips/bites;Clear throat  intermittently;Follow solids with liquid Postural Changes and/or Swallow Maneuvers: Seated upright 90  degrees;Upright 30-60 min after meal;Out of bed for meals    Other  Recommendations Oral Care Recommendations: Oral care BID Other Recommendations: Order thickener from pharmacy   Follow Up Recommendations  Inpatient Rehab    Frequency and Duration min 2x/week  2 weeks   Pertinent Vitals/Pain NA    SLP Swallow Goals     General HPI: 73 year old male with HTN and DM presented 5/27 to   ED c/o N/V since 5/25. In ED found to be in acute renal failure,  hyperkalemic, hypotensive with SBP's in 70s. Started on BiPAP for  hypoxia and increased WOB. Intubated from 5/28 to 6/1. Pt with  ICU delirium. Admission Chest CXR says New confluent bibasilar  pulmonary opacity, nonspecific but consider bilateral pneumonia  or aspiration.  Type of Study: Modified Barium Swallowing Study Reason for Referral: Objectively evaluate swallowing function Previous Swallow Assessment: MBS 01/21/14 - NPO Diet Prior to this Study: NPO;Panda Temperature Spikes Noted: No Respiratory Status: Room air History of Recent Intubation: Yes Length of Intubations (days): 5 days Date extubated: 01/18/14 Behavior/Cognition: Alert;Cooperative;Requires  cueing;Distractible Oral Cavity - Dentition: Dentures, top;Dentures, bottom Oral Motor / Sensory Function: Impaired - see Bedside swallow  eval Self-Feeding Abilities: Able to feed self Patient Positioning: Upright in chair Baseline Vocal Quality: Clear Volitional Cough: Strong Volitional Swallow: Able to elicit Anatomy: Other (Comment) (Protrusion at c5/6, prominent CP) Pharyngeal Secretions: Not observed secondary MBS    Reason for Referral Objectively evaluate swallowing function   Oral Phase Oral Preparation/Oral Phase Oral Phase: WFL   Pharyngeal Phase Pharyngeal Phase Pharyngeal Phase: Impaired Pharyngeal - Honey Pharyngeal - Honey Teaspoon: Reduced epiglottic inversion;Reduced  anterior laryngeal mobility;Reduced laryngeal  elevation;Pharyngeal residue - valleculae;Pharyngeal residue - cp  segment Penetration/Aspiration details (honey teaspoon): Material does  not enter airway Pharyngeal - Nectar Pharyngeal - Nectar Teaspoon: Reduced epiglottic  inversion;Reduced anterior laryngeal mobility;Reduced laryngeal  elevation;Pharyngeal residue - valleculae;Pharyngeal residue - cp  segment Penetration/Aspiration details (nectar teaspoon): Material does  not enter airway Pharyngeal - Nectar Cup:  Reduced epiglottic inversion;Reduced  anterior laryngeal mobility;Reduced laryngeal  elevation;Pharyngeal residue - valleculae;Pharyngeal residue - cp  segment;Penetration/Aspiration after swallow Penetration/Aspiration details (nectar cup): Material enters  airway, remains ABOVE vocal cords and not ejected out;Material  does not enter airway Pharyngeal - Nectar Straw: Reduced epiglottic inversion;Reduced  anterior laryngeal mobility;Reduced laryngeal  elevation;Pharyngeal residue - valleculae;Pharyngeal residue - cp  segment;Penetration/Aspiration after swallow Penetration/Aspiration details (nectar straw): Material enters  airway, remains ABOVE vocal cords and not ejected out;Material  does not enter airway Pharyngeal - Thin Pharyngeal - Thin Teaspoon: Not tested Pharyngeal - Thin Cup: Reduced epiglottic inversion;Reduced  anterior laryngeal mobility;Reduced laryngeal  elevation;Pharyngeal residue - valleculae;Pharyngeal residue - cp  segment;Penetration/Aspiration after swallow;Trace aspiration Penetration/Aspiration details (thin cup): Material enters  airway, passes BELOW cords without attempt by patient to eject  out (silent aspiration);Material enters airway, CONTACTS cords  and  not ejected out Pharyngeal - Thin Straw: Not tested Pharyngeal - Solids Pharyngeal - Puree: Reduced epiglottic inversion;Reduced anterior  laryngeal mobility;Reduced laryngeal elevation;Pharyngeal residue  - valleculae;Pharyngeal residue - cp segment Penetration/Aspiration details (puree): Material does not enter  airway Pharyngeal - Regular: Reduced epiglottic inversion;Reduced  anterior laryngeal mobility;Reduced laryngeal  elevation;Pharyngeal residue - valleculae;Pharyngeal residue - cp  segment Pharyngeal - Pill: Not tested  Cervical Esophageal Phase    GO    Cervical Esophageal Phase Cervical Esophageal Phase: Impaired Cervical Esophageal Phase - Comment Cervical Esophageal Comment: see impression statement        Herbie Baltimore, MA CCC-SLP [phone]  Katherene Ponto Deblois 01/26/2014, 12:48 PM     Scheduled Meds: . antiseptic oral rinse  15 mL Mouth Rinse BID  . aspirin  81 mg Oral Daily  . feeding supplement (NEPRO CARB STEADY)  1,000 mL Per Tube Q24H  . insulin aspart  0-24 Units Subcutaneous 6 times per day  . [START ON 01/28/2014] pantoprazole  40 mg Oral QAC breakfast   Continuous Infusions:   Antibiotics Given (last 72 hours)   None       Time spent: [place] [facility] Pager [phone]. If 7PM-7AM, please contact night-coverage at [email or web address], password [facility] 01/27/2014, 2:16 PM  LOS: 14 days

## 2014-01-27 NOTE — Progress Notes (Signed)
Physical Therapy Treatment Patient Details Name: [name] MRN: [id number] DOB: Jun 29, 1941 Today's Date: 01/27/2014    History of Present Illness 73 year old male with HTN and DM presented 5/27 to ED c/o N/V since 5/25. In ED found to be in acute renal failure, hyperkalemic, hypotensive with SBP's in 70s. Started on BiPAP for hypoxia and increased WOB. intubated 5/28 and extubated 6/1 Pt currently with Hemodialysis Catheter Left side of neck    PT Comments    Showing progress in that he was able to walk in room distance, and this is the first time he has ambulated with PT; Needed much encouragement to participate, and was quite fatigued post amb; Recommend Geomat cushion for pressure redistribution while pt is sitting in chair  Continue to recommend postacute rehab maximize independence and safety with mobility   Follow Up Recommendations  SNF;Supervision/Assistance - 24 hour     Equipment Recommendations  Rolling walker with 5" wheels;3in1 (PT)    Recommendations for Other Services       Precautions / Restrictions Precautions Precautions: Fall    Mobility  Bed Mobility Overal bed mobility: Needs Assistance Bed Mobility: Rolling;Sidelying to Sit Rolling: Supervision Sidelying to sit: Mod assist   Sit to supine: Min assist   General bed mobility comments: Cues for technique and physical assist at shoulder and pelvic girdles to come to sit  Transfers Overall transfer level: Needs assistance Equipment used: Rolling walker (2 wheeled) Transfers: Sit to/from Stand Sit to Stand: Mod assist         General transfer comment: Light mod assist with sit to stand, and noted pt quite dependent on momentum  Ambulation/Gait Ambulation/Gait assistance: Mod assist;+2 safety/equipment Ambulation Distance (Feet): 20 Feet Assistive device: Rolling walker (2 wheeled) Gait Pattern/deviations: Decreased step length - right;Decreased step length - left;Trunk flexed Gait velocity:  slowed   General Gait Details: Cues for posture, and to keep RW close; needed mod assist for RW management   Stairs            Wheelchair Mobility    Modified Rankin (Stroke Patients Only)       Balance     Sitting balance-Leahy Scale: Fair     Standing balance support: Bilateral upper extremity supported Standing balance-Leahy Scale: Poor                      Cognition Arousal/Alertness: Awake/alert Behavior During Therapy: WFL for tasks assessed/performed Overall Cognitive Status: Within Functional Limits for tasks assessed         Following Commands: Follows one step commands consistently            Exercises      General Comments        Pertinent Vitals/Pain no apparent distress Ended session in bed with pt L semi-sidelying    Home Living                      Prior Function            PT Goals (current goals can now be found in the care plan section) Acute Rehab PT Goals Patient Stated Goal: not stated PT Goal Formulation: With patient Time For Goal Achievement: 02/04/14 Potential to Achieve Goals: Good Progress towards PT goals: Progressing toward goals    Frequency  Min 3X/week    PT Plan Current plan remains appropriate    Co-evaluation             End  of Session Equipment Utilized During Treatment: Gait belt Activity Tolerance: Patient limited by fatigue Patient left: in bed;with call bell/phone within reach     Time: 1341-1359 PT Time Calculation (min): 18 min  Charges:  $Gait Training: 8-22 mins                    G Codes:      Quin Hoop 01/27/2014, 4:06 PM Roney Marion, [place] Pager [phone] Office [phone]

## 2014-01-27 NOTE — Progress Notes (Signed)
Occupational Therapy Treatment Patient Details Name: [name] MRN: [id number] DOB: [birth year] Today's Date: 01/27/2014    History of present illness 73 year old male with HTN and DM presented 5/27 to ED c/o N/V since 5/25. In ED found to be in acute renal failure, hyperkalemic, hypotensive with SBP's in 70s. Started on BiPAP for hypoxia and increased WOB. intubated 5/28 and extubated 6/1 Pt currently with Hemodialysis Catheter Left side of neck   OT comments  Pt progressing and much improvement since last OT session(goals updated). Continue to recommend SNF.  Follow Up Recommendations  SNF    Equipment Recommendations  Wheelchair (measurements OT);Wheelchair cushion (measurements OT);3 in 1 bedside comode    Recommendations for Other Services      Precautions / Restrictions Precautions Precautions: Fall Restrictions Weight Bearing Restrictions: No       Mobility Bed Mobility Overal bed mobility: Needs Assistance Bed Mobility: Rolling;Sidelying to Sit Rolling: Supervision Sidelying to sit: Min guard       General bed mobility comments: Cues for technique. Pt able to perform without physical assist.  Transfers Overall transfer level: Needs assistance Equipment used: Rolling walker (2 wheeled) Transfers: Sit to/from [facility] Sit to Stand: Min assist;Mod assist Stand pivot transfers: +2 physical assistance;Mod assist       General transfer comment: Cues for technique. Pt stood a few times from chair and once from bed.    Balance                                   ADL Overall ADL's : Needs assistance/impaired     Grooming: Wash/dry face;Brushing hair;Set up;Supervision/safety;Sitting       Lower Body Bathing: Minimal assistance;Sit to/from stand   Upper Body Dressing : Sitting;Minimal assistance   Lower Body Dressing: Supervision/safety;Sitting/lateral leans (donned/doffed socks)   Toilet Transfer: +2 for physical  assistance;Moderate assistance;Stand-pivot           Functional mobility during ADLs: +2 for physical assistance;Moderate assistance (stand pivot) General ADL Comments: OT assisted pt with washing buttocks. Pt taking a few rest breaks during session.      Vision                     Perception     Praxis      Cognition   Behavior During Therapy: [facility] for tasks assessed/performed Overall Cognitive Status: Impaired/Different from baseline Area of Impairment: Memory;Following commands;Problem solving     Memory: Decreased short-term memory  Following Commands: Follows one step commands inconsistently     Problem Solving: Slow processing      Extremity/Trunk Assessment               Exercises     Shoulder Instructions       General Comments      Pertinent Vitals/ Pain       Pt appeared comfortable at end of session.  Home Living                                          Prior Functioning/Environment              Frequency Min 2X/week     Progress Toward Goals  OT Goals(current goals can now be found in the care plan section)  Progress towards OT goals: Progressing  toward goals  Acute Rehab OT Goals Patient Stated Goal: not stated OT Goal Formulation: Patient unable to participate in goal setting Time For Goal Achievement: 02/04/14  Plan Discharge plan remains appropriate    Co-evaluation                 End of Session Equipment Utilized During Treatment: Gait belt;Rolling walker   Activity Tolerance Patient tolerated treatment well   Patient Left in chair;with chair alarm set;with call bell/phone within reach   Nurse Communication Mobility status        Time: 7673-4193 OT Time Calculation (min): 17 min  Charges: OT General Charges $OT Visit: 1 Procedure OT Treatments $Self Care/Home Management : 8-22 mins   Benito Mccreedy  OTR/L [phone]  01/27/2014, 12:02 PM

## 2014-01-28 LAB — GLUCOSE, CAPILLARY
GLUCOSE-CAPILLARY: 106 mg/dL — AB (ref 70–99)
GLUCOSE-CAPILLARY: 115 mg/dL — AB (ref 70–99)
GLUCOSE-CAPILLARY: 163 mg/dL — AB (ref 70–99)
Glucose-Capillary: 132 mg/dL — ABNORMAL HIGH (ref 70–99)
Glucose-Capillary: 112 mg/dL — ABNORMAL HIGH (ref 70–99)
Glucose-Capillary: 150 mg/dL — ABNORMAL HIGH (ref 70–99)

## 2014-01-28 LAB — BASIC METABOLIC PANEL
BUN: 131 mg/dL — AB (ref 6–23)
CHLORIDE: 94 meq/L — AB (ref 96–112)
CO2: 22 meq/L (ref 19–32)
Calcium: 9 mg/dL (ref 8.4–10.5)
Creatinine, Ser: 10.26 mg/dL — ABNORMAL HIGH (ref 0.50–1.35)
GFR calc Af Amer: 5 mL/min — ABNORMAL LOW (ref >90)
GFR calc non Af Amer: 4 mL/min — ABNORMAL LOW (ref >90)
Glucose, Bld: 139 mg/dL — ABNORMAL HIGH (ref 70–99)
Potassium: 5.5 mEq/L — ABNORMAL HIGH (ref 3.7–5.3)
Sodium: 142 mEq/L (ref 137–147)

## 2014-01-28 LAB — CBC
HEMATOCRIT: 23.7 % — AB (ref 39.0–52.0)
HEMOGLOBIN: 7.7 g/dL — AB (ref 13.0–17.0)
MCH: 28.8 pg (ref 26.0–34.0)
MCHC: 32.5 g/dL (ref 30.0–36.0)
MCV: 88.8 fL (ref 78.0–100.0)
Platelets: 413 10*3/uL — ABNORMAL HIGH (ref 150–400)
RBC: 2.67 MIL/uL — ABNORMAL LOW (ref 4.22–5.81)
RDW: 16.6 % — AB (ref 11.5–15.5)
WBC: 13 10*3/uL — AB (ref 4.0–10.5)

## 2014-01-28 MED ORDER — SODIUM POLYSTYRENE SULFONATE 15 GM/60ML PO SUSP
30.0000 g | Freq: Once | ORAL | Status: AC
  Administered 2014-01-28: 30 g via ORAL
  Filled 2014-01-28: qty 120

## 2014-01-28 MED ORDER — NEPRO/CARBSTEADY PO LIQD
237.0000 mL | Freq: Three times a day (TID) | ORAL | Status: DC
  Administered 2014-01-28 – 2014-02-01 (×9): 237 mL via ORAL

## 2014-01-28 NOTE — Progress Notes (Signed)
Assessment  1. Now oliguric AKI 2/2 ATN related to NSAID, ACEi, hypovolemia, PNA, Septic shock: CRRT 5/28-6/2. Early recovery phase 2. Anemia: Hb 7.7gm  Plan: Will observe without dialysis for now.  He will need another access if HD indicated. Nepro instead of Ensure.   Subjective: No uremic s/s. Making urine now   Objective: Vital signs in last 24 hours: Temp:  [98.4 F (36.9 C)-98.6 F (37 C)] 98.6 F (37 C) (06/11 0616) Pulse Rate:  [63-87] 87 (06/11 0616) Resp:  [18] 18 (06/11 0616) BP: (110-155)/(50-79) 155/79 mmHg (06/11 0616) SpO2:  [97 %-98 %] 97 % (06/11 0616) Weight:  [83.2 kg (183 lb 6.8 oz)] 83.2 kg (183 lb 6.8 oz) (06/10 2135) Weight change: 2 kg (4 lb 6.5 oz)  Intake/Output from previous day: 06/10 0701 - 06/11 0700 In: 240 [P.O.:240] Out: 350 [Urine:350] Intake/Output this shift:    General appearance: alert and cooperative Resp: clear to auscultation bilaterally Cardio: regular rate and rhythm, S1, S2 normal, no murmur, click, rub or gallop Extremities: extremities normal, atraumatic, no cyanosis or edema  No asterixis  Lab Results:  Recent Labs  01/26/14 0445 01/28/14 0700  WBC 14.3* 13.0*  HGB 7.9* 7.7*  HCT 24.6* 23.7*  PLT 394 413*   BMET:  Recent Labs  01/27/14 0620 01/28/14 0700  NA 142 142  K 5.0 5.5*  CL 94* 94*  CO2 23 22  GLUCOSE 114* 139*  BUN 103* 131*  CREATININE 8.46* 10.26*  CALCIUM 9.1 9.0   No results found for this basename: PTH,  in the last 72 hours Iron Studies: No results found for this basename: IRON, TIBC, TRANSFERRIN, FERRITIN,  in the last 72 hours Studies/Results: Dg Esophagus  01/26/2014   CLINICAL DATA:  Dysphagia. Modified swallow earlier in the morning only clearing patient for nectar thickness.  EXAM: ESOPHOGRAM/BARIUM SWALLOW  TECHNIQUE: Single contrast examination was performed using  thick barium.  FLUOROSCOPY TIME:  3 min and 20 is seconds  COMPARISON:  Chest radiograph 01/19/2014.  FINDINGS: Focused  single-contrast exam was performed in a supine, mildly LPO position. The bed was maintained partially upright.  Evaluation of a single swallow demonstrates contrast stasis throughout the mid and upper thoracic esophagus.  Full column evaluation of the esophagus demonstrates no persistent narrowing or stricture.  13 mm barium tablet has initial delay in the vallecula. On subsequent swallows, the tablet passes into the esophagus and then into the stomach.  IMPRESSION: 1. Moderately degraded exam. 2. 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 3. No evidence of  stricture other anatomic cause for dysphagia. 4. Delayed passage of the barium tablet at the level of the vallecula.   Electronically Signed   By: Abigail Miyamoto M.D.   On: 01/26/2014 16:14   Dg Swallowing Func-speech Pathology  01/26/2014   Katherene Ponto Deblois, CCC-SLP     01/26/2014 12:49 PM Objective Swallowing Evaluation: Modified Barium Swallowing Study   Patient Details  Name: [name] MRN: [id number] Date of Birth: [birth year]  Today's Date: 01/26/2014 Time: 9381-8299 SLP Time Calculation (min): 19 min  Past Medical History:  Past Medical History  Diagnosis Date  . Diabetes mellitus without complication   . Hypertension   . Hypercholesteremia    Past Surgical History:  Past Surgical History  Procedure Laterality Date  . Prostatectomy    . Colon resection     HPI:  73 year old male with HTN and DM presented 5/27 to ED c/o N/V  since 5/25. In ED  found to be in acute renal failure,  hyperkalemic, hypotensive with SBP's in 70s. Started on BiPAP for  hypoxia and increased WOB. Intubated from 5/28 to 6/1. Pt with  ICU delirium. Admission Chest CXR says New confluent bibasilar  pulmonary opacity, nonspecific but consider bilateral pneumonia  or aspiration.      Assessment / Plan / Recommendation Clinical Impression  Dysphagia Diagnosis: Mild cervical esophageal phase  dysphagia;Suspected primary esophageal dysphagia;Mild pharyngeal  phase dysphagia  Clinical impression: Overall strength and timeliness of swallow  has significantly improved though mild to moderate dyspahgia  persists. Reduced hyoid excursion and base of tongue retraction  leads to mild to moderate vallecular residuals that, with thin  liquids, are silently penetrated/aspirated after the swallow.  Quantity and severity of penetration with nectar thick liquids is  trace and is easily cleared with occasional throat clear. The  primary problem is likely more related to cervical esophageal and  possibly esopahgeal function. There is a prominent CP, reduced  opening of UES and pt was noted to have significant esophageal  stasis building up to proximal esophagus with one instance of  backflow to pharynx. Pt is recommended to consume a dys 3  (mechanical soft) diet with nectar thick liquids, intermittent  throat clear and esophageal precautions. Discussed concerns with  MD. Kerby Moors diet will reduce risk, but still pt may aspirate  esophageal backflow.     Treatment Recommendation  Therapy as outlined in treatment plan below    Diet Recommendation Dysphagia 3 (Mechanical Soft);Nectar-thick  liquid   Liquid Administration via: Cup;Straw Medication Administration: Crushed with puree Supervision: Patient able to self feed;Full supervision/cueing  for compensatory strategies Compensations: Slow rate;Small sips/bites;Clear throat  intermittently;Follow solids with liquid Postural Changes and/or Swallow Maneuvers: Seated upright 90  degrees;Upright 30-60 min after meal;Out of bed for meals    Other  Recommendations Oral Care Recommendations: Oral care BID Other Recommendations: Order thickener from pharmacy   Follow Up Recommendations  Inpatient Rehab    Frequency and Duration min 2x/week  2 weeks   Pertinent Vitals/Pain NA    SLP Swallow Goals     General HPI: 73 year old male with HTN and DM presented 5/27 to  ED c/o N/V since 5/25. In ED found to be in acute renal failure,  hyperkalemic, hypotensive with  SBP's in 70s. Started on BiPAP for  hypoxia and increased WOB. Intubated from 5/28 to 6/1. Pt with  ICU delirium. Admission Chest CXR says New confluent bibasilar  pulmonary opacity, nonspecific but consider bilateral pneumonia  or aspiration.  Type of Study: Modified Barium Swallowing Study Reason for Referral: Objectively evaluate swallowing function Previous Swallow Assessment: MBS 01/21/14 - NPO Diet Prior to this Study: NPO;Panda Temperature Spikes Noted: No Respiratory Status: Room air History of Recent Intubation: Yes Length of Intubations (days): 5 days Date extubated: 01/18/14 Behavior/Cognition: Alert;Cooperative;Requires  cueing;Distractible Oral Cavity - Dentition: Dentures, top;Dentures, bottom Oral Motor / Sensory Function: Impaired - see Bedside swallow  eval Self-Feeding Abilities: Able to feed self Patient Positioning: Upright in chair Baseline Vocal Quality: Clear Volitional Cough: Strong Volitional Swallow: Able to elicit Anatomy: Other (Comment) (Protrusion at c5/6, prominent CP) Pharyngeal Secretions: Not observed secondary MBS    Reason for Referral Objectively evaluate swallowing function   Oral Phase Oral Preparation/Oral Phase Oral Phase: WFL   Pharyngeal Phase Pharyngeal Phase Pharyngeal Phase: Impaired Pharyngeal - Honey Pharyngeal - Honey Teaspoon: Reduced epiglottic inversion;Reduced  anterior laryngeal mobility;Reduced laryngeal  elevation;Pharyngeal residue - valleculae;Pharyngeal residue - cp  segment Penetration/Aspiration details (honey teaspoon): Material does  not enter airway Pharyngeal - Nectar Pharyngeal - Nectar Teaspoon: Reduced epiglottic  inversion;Reduced anterior laryngeal mobility;Reduced laryngeal  elevation;Pharyngeal residue - valleculae;Pharyngeal residue - cp  segment Penetration/Aspiration details (nectar teaspoon): Material does  not enter airway Pharyngeal - Nectar Cup: Reduced epiglottic inversion;Reduced  anterior laryngeal mobility;Reduced laryngeal   elevation;Pharyngeal residue - valleculae;Pharyngeal residue - cp  segment;Penetration/Aspiration after swallow Penetration/Aspiration details (nectar cup): Material enters  airway, remains ABOVE vocal cords and not ejected out;Material  does not enter airway Pharyngeal - Nectar Straw: Reduced epiglottic inversion;Reduced  anterior laryngeal mobility;Reduced laryngeal  elevation;Pharyngeal residue - valleculae;Pharyngeal residue - cp  segment;Penetration/Aspiration after swallow Penetration/Aspiration details (nectar straw): Material enters  airway, remains ABOVE vocal cords and not ejected out;Material  does not enter airway Pharyngeal - Thin Pharyngeal - Thin Teaspoon: Not tested Pharyngeal - Thin Cup: Reduced epiglottic inversion;Reduced  anterior laryngeal mobility;Reduced laryngeal  elevation;Pharyngeal residue - valleculae;Pharyngeal residue - cp  segment;Penetration/Aspiration after swallow;Trace aspiration Penetration/Aspiration details (thin cup): Material enters  airway, passes BELOW cords without attempt by patient to eject  out (silent aspiration);Material enters airway, CONTACTS cords  and not ejected out Pharyngeal - Thin Straw: Not tested Pharyngeal - Solids Pharyngeal - Puree: Reduced epiglottic inversion;Reduced anterior  laryngeal mobility;Reduced laryngeal elevation;Pharyngeal residue  - valleculae;Pharyngeal residue - cp segment Penetration/Aspiration details (puree): Material does not enter  airway Pharyngeal - Regular: Reduced epiglottic inversion;Reduced  anterior laryngeal mobility;Reduced laryngeal  elevation;Pharyngeal residue - valleculae;Pharyngeal residue - cp  segment Pharyngeal - Pill: Not tested  Cervical Esophageal Phase    GO    Cervical Esophageal Phase Cervical Esophageal Phase: Impaired Cervical Esophageal Phase - Comment Cervical Esophageal Comment: see impression statement        Herbie Baltimore, MA CCC-SLP [phone]  Katherene Ponto Deblois 01/26/2014, 12:48 PM    Scheduled: .  antiseptic oral rinse  15 mL Mouth Rinse BID  . aspirin  81 mg Oral Daily  . feeding supplement (ENSURE)  1 Container Oral BID BM  . insulin aspart  0-24 Units Subcutaneous 6 times per day  . pantoprazole  40 mg Oral QAC breakfast     LOS: 15 days   Benisha Hadaway C 01/28/2014,9:03 AM

## 2014-01-28 NOTE — Progress Notes (Signed)
PROGRESS NOTE  [name]:[id number] DOB: 1941/03/12 DOA: 01/13/2014 PCP: No primary provider on file.    BRIEF PATIENT DESCRIPTION: 73 year old male with HTN and DM presented 5/27 to ED c/o N/V since 5/25. In ED found to be in acute renal failure, hyperkalemic, hypotensive with SBP's in 70s. Started on BiPAP for hypoxia and increased WOB. PCCM asked to admit.   SIGNIFICANT EVENTS:  5/27 admitted  5/28 intubated, shock, pressors, CVVH  5/30 Start pressure support trials  6/1 extubated, off pressors  6/2 D/c'd CRRT  6/3 Improving confusion   STUDIES:  Echo 5/28 >> EF 65 to 54%, grade 2 diastolic dysfx, mod AS  Renal u/s 5/28 >> medical renal disease   Assessment/Plan:  [name] 2/2 ATN related to NSAID, ACEi, hypovolemia, PNA, Septic shock -CRRT 5/28-6/2.  -last HD was 6/8 HD cath "came out" this morning. Creatinine increasing. Management per renal. Potassium 5.5  Anemia: Hb > 7.7  PNA s/p Zosyn  AMS/Delierium resolved  Leukocytosis -trending down  DM -SSI -was on metformin at home  Dysphagia Started on diet  Presbyesophagus  Left message with Dr. Doran Durand upcoming toe surgery.  Will need to postpone until medically more stable  Acute Hypoxemic Respiratory Failure 2nd to PNA, and pulmonary edema - resolved. Extubated 6/1. 6/02: CXR  greatly improved with better aeration  Septic shock - resolved   NSTEMI likely from demand ischemia   Code Status: full Family Communication: wife 6/9 Disposition Plan: med surg then SNF once renal status determined   Consultants:  Nephrology  PCCM  PT/OT  SLP   HPI/Subjective: No complaints  Objective: Filed Vitals:   01/28/14 0946  BP: 134/68  Pulse: 88  Temp: 98.2 F (36.8 C)  Resp: 18    Intake/Output Summary (Last 24 hours) at 01/28/14 1540 Last data filed at 01/28/14 1340  Gross per 24 hour  Intake    540 ml  Output    275 ml  Net    265 ml   Filed Weights   01/25/14  2036 01/26/14 2125 01/27/14 2135  Weight: 79.833 kg (176 lb) 81.2 kg (179 lb 0.2 oz) 83.2 kg (183 lb 6.8 oz)    Exam: Neuro: A+Ox4 HEENT: PERRL  Cardiovascular: regular, 2/6 SM  Lungs: Diminished throughout  Abdomen: Soft, non-tender, non-distended  Ext left 3rd toe with eschar and edema. No cellulitis   Data Reviewed: Basic Metabolic Panel:  Recent Labs Lab 01/24/14 0324 01/25/14 0715 01/26/14 0445 01/27/14 0620 01/28/14 0700  NA 141 140 138 142 142  K 4.0 4.2 3.8 5.0 5.5*  CL 95* 92* 92* 94* 94*  CO2 22 18* 25 23 22   GLUCOSE 116* 123* 134* 114* 139*  BUN 103* 140* 72* 103* 131*  CREATININE 8.34* 10.45* 6.16* 8.46* 10.26*  CALCIUM 9.1 8.9 9.1 9.1 9.0   Liver Function Tests: No results found for this basename: AST, ALT, ALKPHOS, BILITOT, PROT, ALBUMIN,  in the last 168 hours No results found for this basename: LIPASE, AMYLASE,  in the last 168 hours No results found for this basename: AMMONIA,  in the last 168 hours CBC:  Recent Labs Lab 01/22/14 0708 01/24/14 0324 01/25/14 0715 01/26/14 0445 01/28/14 0700  WBC 20.6* 17.6* 16.1* 14.3* 13.0*  HGB 9.5* 8.6* 8.3* 7.9* 7.7*  HCT 29.7* 26.2* 25.3* 24.6* 23.7*  MCV 88.7 88.5 86.1 89.5 88.8  PLT 377 405* 461* 394 413*   Cardiac Enzymes: No results found for this basename: CKTOTAL, CKMB, CKMBINDEX,  TROPONINI,  in the last 168 hours BNP (last 3 results)  Recent Labs  01/13/14 2125  PROBNP 35530.0*   CBG:  Recent Labs Lab 01/27/14 2008 01/28/14 0005 01/28/14 0405 01/28/14 0752 01/28/14 1148  GLUCAP 173* 163* 115* 132* 112*    No results found for this or any previous visit (from the past 240 hour(s)).   Studies: No results found.  Scheduled Meds: . antiseptic oral rinse  15 mL Mouth Rinse BID  . aspirin  81 mg Oral Daily  . feeding supplement (NEPRO CARB STEADY)  237 mL Oral TID WC  . insulin aspart  0-24 Units Subcutaneous 6 times per day  . pantoprazole  40 mg Oral QAC breakfast  . sodium  polystyrene  30 g Oral Once   Continuous Infusions:   Antibiotics Given (last 72 hours)   None       Time spent: [facility] Pager [phone]. If 7PM-7AM, please contact night-coverage at [email or web address], password [facility] 01/28/2014, 3:40 PM  LOS: 15 days

## 2014-01-28 NOTE — Progress Notes (Signed)
During shift-to-shift bedside report, discovered that left neck HD cath had been removed by patient.  Patient stated that he did not know that it had come out and did not feel anything.  MD notified.  Jillyn Ledger, MBA, BS, RN

## 2014-01-29 LAB — BASIC METABOLIC PANEL
BUN: 152 mg/dL — ABNORMAL HIGH (ref 6–23)
CALCIUM: 8.5 mg/dL (ref 8.4–10.5)
CO2: 19 meq/L (ref 19–32)
Chloride: 92 mEq/L — ABNORMAL LOW (ref 96–112)
Creatinine, Ser: 11.74 mg/dL — ABNORMAL HIGH (ref 0.50–1.35)
GFR calc non Af Amer: 4 mL/min — ABNORMAL LOW (ref >90)
GFR, EST AFRICAN AMERICAN: 4 mL/min — AB (ref >90)
Glucose, Bld: 100 mg/dL — ABNORMAL HIGH (ref 70–99)
Potassium: 6.1 mEq/L — ABNORMAL HIGH (ref 3.7–5.3)
SODIUM: 142 meq/L (ref 137–147)

## 2014-01-29 LAB — GLUCOSE, CAPILLARY
GLUCOSE-CAPILLARY: 143 mg/dL — AB (ref 70–99)
GLUCOSE-CAPILLARY: 133 mg/dL — AB (ref 70–99)
Glucose-Capillary: 116 mg/dL — ABNORMAL HIGH (ref 70–99)
Glucose-Capillary: 105 mg/dL — ABNORMAL HIGH (ref 70–99)
Glucose-Capillary: 101 mg/dL — ABNORMAL HIGH (ref 70–99)
Glucose-Capillary: 133 mg/dL — ABNORMAL HIGH (ref 70–99)

## 2014-01-29 MED ORDER — SODIUM POLYSTYRENE SULFONATE 15 GM/60ML PO SUSP
45.0000 g | Freq: Once | ORAL | Status: AC
  Administered 2014-01-29: 45 g via ORAL
  Filled 2014-01-29: qty 180

## 2014-01-29 MED ORDER — SODIUM CHLORIDE 0.45 % IV SOLN
INTRAVENOUS | Status: DC
  Administered 2014-01-29 – 2014-01-30 (×3): via INTRAVENOUS

## 2014-01-29 MED ORDER — INSULIN ASPART 100 UNIT/ML ~~LOC~~ SOLN
0.0000 [IU] | Freq: Three times a day (TID) | SUBCUTANEOUS | Status: DC
  Administered 2014-02-03: 4 [IU] via SUBCUTANEOUS
  Administered 2014-02-04 – 2014-02-05 (×4): 2 [IU] via SUBCUTANEOUS
  Administered 2014-02-06: 4 [IU] via SUBCUTANEOUS
  Administered 2014-02-07 (×2): 2 [IU] via SUBCUTANEOUS
  Administered 2014-02-08: 4 [IU] via SUBCUTANEOUS
  Administered 2014-02-09 – 2014-02-10 (×2): 2 [IU] via SUBCUTANEOUS

## 2014-01-29 NOTE — Progress Notes (Signed)
Speech Language Pathology Treatment: Dysphagia  Patient Details Name: [name] MRN: [id number] DOB: Nov 21, 1940 Today's Date: 01/29/2014 Time: 1350-1420 SLP Time Calculation (min): 30 min  Assessment / Plan / Recommendation Clinical Impression  Pt. Seen for dysphagia treat with wife present (has not been present during previous ST sessions).  SLP educated/reviewed results of MBS, recommendations and clinical rationale for strategies/diet modification.  Pt. stated recommended strategies without assist and was modified independent using strategies during consumption of nectar without s/s aspiration.  Reviewed strategies again at end of session primarily esophageal precautions.  Pt. will likely be able to return to thin liquids once strength and endurance improves (may be at [facility]).    HPI HPI: 73 year old male with HTN and DM presented 5/27 to ED c/o N/V since 5/25. In ED found to be in acute renal failure, hyperkalemic, hypotensive with SBP's in 70s. Started on BiPAP for hypoxia and increased WOB. Intubated from 5/28 to 6/1. Pt with ICU delirium. Admission Chest CXR says New confluent bibasilar pulmonary opacity, nonspecific but consider bilateral pneumonia or aspiration. Pt has undergone multiple MBS studies with yesterday's study showing improved swallow function to allow po intake with strict precautions.     Pertinent Vitals WDL  SLP Plan  Continue with current plan of care    Recommendations Diet recommendations: Dysphagia 3 (mechanical soft);Nectar-thick liquid Liquids provided via: Cup;Straw (small sips if using straw) Medication Administration: Whole meds with puree Supervision: Patient able to self feed;Full supervision/cueing for compensatory strategies Compensations: Slow rate;Small sips/bites;Clear throat intermittently;Follow solids with liquid Postural Changes and/or Swallow Maneuvers: Seated upright 90 degrees;Upright 30-60 min after meal;Out of bed for meals              Oral Care Recommendations: Oral care BID Follow up Recommendations:  (pt. waiting for bed at [facility]) Plan: Continue with current plan of care    GO     Orbie Pyo Ameliana Brashear M.Ed [employer] [phone] 01/29/2014

## 2014-01-29 NOTE — Progress Notes (Signed)
PROGRESS NOTE  [name] KDT:[id number] DOB: Jul 24, 1941 DOA: 01/13/2014 PCP: No primary provider on file.    BRIEF PATIENT DESCRIPTION: 73 year old male with HTN and DM presented 5/27 to ED c/o N/V since 5/25. In ED found to be in acute renal failure, hyperkalemic, hypotensive with SBP's in 70s. Started on BiPAP for hypoxia and increased WOB. PCCM asked to admit.   SIGNIFICANT EVENTS:  5/27 admitted  5/28 intubated, shock, pressors, CVVH  5/30 Start pressure support trials  6/1 extubated, off pressors  6/2 D/c'd CRRT  6/3 Improving confusion   STUDIES:  Echo 5/28 >> EF 65 to 99%, grade 2 diastolic dysfx, mod AS  Renal u/s 5/28 >> medical renal disease   Assessment/Plan:  [name] 2/2 ATN related to NSAID, ACEi, hypovolemia, PNA, Septic shock -CRRT 5/28-6/2.  -last HD was 6/8 HD cath "came out" 6/11. Creatinine and potassium increasing. Will resume tele and check EKG. Nephrology gave kalexalate  Anemia: Hb > 7.7  PNA s/p Zosyn  AMS/Delierium resolved  Leukocytosis -trending down  DM -SSI -was on metformin at home  Dysphagia Started on diet  Presbyesophagus  Left message with Dr. Doran Durand upcoming toe surgery.  Will need to postpone until medically more stable  Acute Hypoxemic Respiratory Failure 2nd to PNA, and pulmonary edema - resolved. Extubated 6/1. 6/02: CXR  greatly improved with better aeration  Septic shock - resolved   NSTEMI likely from demand ischemia   Code Status: full Family Communication: wife 6/9 Disposition Plan: med surg then SNF once renal status determined   Consultants:  Nephrology  PCCM  PT/OT  SLP   HPI/Subjective: No complaints  Objective: Filed Vitals:   01/29/14 0940  BP: 150/77  Pulse: 91  Temp: 98.7 F (37.1 C)  Resp: 16    Intake/Output Summary (Last 24 hours) at 01/29/14 1532 Last data filed at 01/29/14 1440  Gross per 24 hour  Intake   1080 ml  Output      0 ml  Net   1080 ml     Filed Weights   01/26/14 2125 01/27/14 2135 01/29/14 0454  Weight: 81.2 kg (179 lb 0.2 oz) 83.2 kg (183 lb 6.8 oz) 83.6 kg (184 lb 4.9 oz)    Exam: Neuro: A+Ox4 HEENT: PERRL  Cardiovascular: regular, 2/6 SM  Lungs: Diminished throughout  Abdomen: Soft, non-tender, non-distended  Ext left 3rd toe with eschar and edema. No cellulitis   Data Reviewed: Basic Metabolic Panel:  Recent Labs Lab 01/25/14 0715 01/26/14 0445 01/27/14 0620 01/28/14 0700 01/29/14 0640  NA 140 138 142 142 142  K 4.2 3.8 5.0 5.5* 6.1*  CL 92* 92* 94* 94* 92*  CO2 18* 25 23 22 19   GLUCOSE 123* 134* 114* 139* 100*  BUN 140* 72* 103* 131* 152*  CREATININE 10.45* 6.16* 8.46* 10.26* 11.74*  CALCIUM 8.9 9.1 9.1 9.0 8.5   Liver Function Tests: No results found for this basename: AST, ALT, ALKPHOS, BILITOT, PROT, ALBUMIN,  in the last 168 hours No results found for this basename: LIPASE, AMYLASE,  in the last 168 hours No results found for this basename: AMMONIA,  in the last 168 hours CBC:  Recent Labs Lab 01/24/14 0324 01/25/14 0715 01/26/14 0445 01/28/14 0700  WBC 17.6* 16.1* 14.3* 13.0*  HGB 8.6* 8.3* 7.9* 7.7*  HCT 26.2* 25.3* 24.6* 23.7*  MCV 88.5 86.1 89.5 88.8  PLT 405* 461* 394 413*   Cardiac Enzymes: No results found for this basename: CKTOTAL, CKMB,  CKMBINDEX, TROPONINI,  in the last 168 hours BNP (last 3 results)  Recent Labs  01/13/14 2125  PROBNP 35530.0*   CBG:  Recent Labs Lab 01/28/14 2052 01/29/14 0057 01/29/14 0439 01/29/14 0732 01/29/14 1200  GLUCAP 106* 116* 105* 101* 143*    No results found for this or any previous visit (from the past 240 hour(s)).   Studies: No results found.  Scheduled Meds: . antiseptic oral rinse  15 mL Mouth Rinse BID  . aspirin  81 mg Oral Daily  . feeding supplement (NEPRO CARB STEADY)  237 mL Oral TID WC  . insulin aspart  0-24 Units Subcutaneous 6 times per day  . pantoprazole  40 mg Oral QAC breakfast   Continuous  Infusions: . sodium chloride 125 mL/hr at 01/29/14 1025   Antibiotics Given (last 72 hours)   None       Time spent: [facility] Pager [phone]. If 7PM-7AM, please contact night-coverage at [email or web address], password [facility] 01/29/2014, 3:32 PM  LOS: 16 days

## 2014-01-29 NOTE — Progress Notes (Signed)
Assessment  1. Now oliguric AKI 2/2 ATN related to NSAID, ACEi, hypovolemia, PNA, Septic shock: CRRT 5/28-6/2. Early recovery phase 2. Anemia: Hb 7.7gm 3. Hyperkalemia Plan: IVF, He will need another access if HD indicated. Kayexalate.   Subjective: Interval History: More UOP  Objective: Vital signs in last 24 hours: Temp:  [98.2 F (36.8 C)-98.7 F (37.1 C)] 98.7 F (37.1 C) (06/12 0940) Pulse Rate:  [84-94] 91 (06/12 0940) Resp:  [16-18] 16 (06/12 0940) BP: (147-166)/(71-97) 150/77 mmHg (06/12 0940) SpO2:  [96 %-99 %] 96 % (06/12 0940) Weight:  [83.6 kg (184 lb 4.9 oz)] 83.6 kg (184 lb 4.9 oz) (06/12 0454) Weight change: 0.4 kg (14.1 oz)  Intake/Output from previous day: 06/11 0701 - 06/12 0700 In: 780 [P.O.:780] Out: 275 [Urine:275] Intake/Output this shift: Total I/O In: 360 [P.O.:360] Out: -   General appearance: alert and cooperative Resp: clear to auscultation bilaterally Chest wall: no tenderness Extremities: extremities normal, atraumatic, no cyanosis or edema  Lab Results:  Recent Labs  01/28/14 0700  WBC 13.0*  HGB 7.7*  HCT 23.7*  PLT 413*   BMET:  Recent Labs  01/28/14 0700 01/29/14 0640  NA 142 142  K 5.5* 6.1*  CL 94* 92*  CO2 22 19  GLUCOSE 139* 100*  BUN 131* 152*  CREATININE 10.26* 11.74*  CALCIUM 9.0 8.5   No results found for this basename: PTH,  in the last 72 hours Iron Studies: No results found for this basename: IRON, TIBC, TRANSFERRIN, FERRITIN,  in the last 72 hours Studies/Results: No results found.  Scheduled: . antiseptic oral rinse  15 mL Mouth Rinse BID  . aspirin  81 mg Oral Daily  . feeding supplement (NEPRO CARB STEADY)  237 mL Oral TID WC  . insulin aspart  0-24 Units Subcutaneous 6 times per day  . pantoprazole  40 mg Oral QAC breakfast     LOS: 16 days   Deloyd Handy C 01/29/2014,10:38 AM

## 2014-01-29 NOTE — Progress Notes (Signed)
Physical Therapy Treatment Patient Details Name: [name] MRN: [id number] DOB: 1941-03-24 Today's Date: 01/29/2014    History of Present Illness 73 year old male with HTN and DM presented 5/27 to ED c/o N/V since 5/25. In ED found to be in acute renal failure, hyperkalemic, hypotensive with SBP's in 70s. Started on BiPAP for hypoxia and increased WOB. intubated 5/28 and extubated 6/1 Pt currently with Hemodialysis Catheter Left side of neck    PT Comments    Patient having difficulty with mobility/gait today.  Reports feeling weak.  Follow Up Recommendations  SNF;Supervision/Assistance - 24 hour     Equipment Recommendations  Rolling walker with 5" wheels;3in1 (PT)    Recommendations for Other Services       Precautions / Restrictions Precautions Precautions: Fall Restrictions Weight Bearing Restrictions: No    Mobility  Bed Mobility Overal bed mobility: Needs Assistance Bed Mobility: Rolling;Sidelying to Sit Rolling: Supervision Sidelying to sit: Mod assist       General bed mobility comments: Verbal cues for technique.  Assist to raise trunk to sitting position.  Transfers Overall transfer level: Needs assistance Equipment used: Rolling walker (2 wheeled) Transfers: Sit to/from Stand Sit to Stand: Mod assist         General transfer comment: Verbal cues for hand placement (attempting to pull up on RW).  Assist to rise to standing and for balance.  Ambulation/Gait Ambulation/Gait assistance: Mod assist;+2 safety/equipment Ambulation Distance (Feet): 3 Feet Assistive device: Rolling walker (2 wheeled) Gait Pattern/deviations: Step-through pattern;Decreased step length - right;Decreased step length - left;Decreased stride length;Shuffle;Trunk flexed Gait velocity: slowed Gait velocity interpretation: Below normal speed for age/gender General Gait Details: Verbal cues for safe use of RW.  Patient with flexed posture.  Cues to stand upright.  Patient  ambulated 3', and knees began buckling.  Moved to chair with assist.   Stairs            Wheelchair Mobility    Modified Rankin (Stroke Patients Only)       Balance                                    Cognition Arousal/Alertness: Awake/alert Behavior During Therapy: WFL for tasks assessed/performed Overall Cognitive Status: Impaired/Different from baseline Area of Impairment: Problem solving;Following commands       Following Commands: Follows one step commands consistently;Follows one step commands with increased time     Problem Solving: Slow processing;Difficulty sequencing      Exercises      General Comments        Pertinent Vitals/Pain     Home Living                      Prior Function            PT Goals (current goals can now be found in the care plan section) Progress towards PT goals: Progressing toward goals    Frequency  Min 3X/week    PT Plan Current plan remains appropriate    Co-evaluation             End of Session Equipment Utilized During Treatment: Gait belt Activity Tolerance: Patient limited by fatigue Patient left: in chair;with call bell/phone within reach;with family/visitor present     Time: 2947-6546 PT Time Calculation (min): 15 min  Charges:  $Therapeutic Activity: 8-22 mins  G Codes:      Despina Pole 01/29/2014, 3:05 PM Carita Pian. Sanjuana Kava, [place] Pager [phone]

## 2014-01-30 DIAGNOSIS — I10 Essential (primary) hypertension: Secondary | ICD-10-CM

## 2014-01-30 DIAGNOSIS — E785 Hyperlipidemia, unspecified: Secondary | ICD-10-CM | POA: Diagnosis present

## 2014-01-30 DIAGNOSIS — E663 Overweight: Secondary | ICD-10-CM | POA: Diagnosis present

## 2014-01-30 DIAGNOSIS — I350 Nonrheumatic aortic (valve) stenosis: Secondary | ICD-10-CM | POA: Diagnosis present

## 2014-01-30 DIAGNOSIS — R1310 Dysphagia, unspecified: Secondary | ICD-10-CM | POA: Diagnosis present

## 2014-01-30 DIAGNOSIS — I214 Non-ST elevation (NSTEMI) myocardial infarction: Secondary | ICD-10-CM | POA: Diagnosis not present

## 2014-01-30 DIAGNOSIS — I5032 Chronic diastolic (congestive) heart failure: Secondary | ICD-10-CM

## 2014-01-30 DIAGNOSIS — L97509 Non-pressure chronic ulcer of other part of unspecified foot with unspecified severity: Secondary | ICD-10-CM | POA: Diagnosis present

## 2014-01-30 HISTORY — DX: Nonrheumatic aortic (valve) stenosis: I35.0

## 2014-01-30 HISTORY — DX: Essential (primary) hypertension: I10

## 2014-01-30 LAB — BASIC METABOLIC PANEL
BUN: 155 mg/dL — ABNORMAL HIGH (ref 6–23)
CO2: 20 meq/L (ref 19–32)
CREATININE: 12.04 mg/dL — AB (ref 0.50–1.35)
Calcium: 7.6 mg/dL — ABNORMAL LOW (ref 8.4–10.5)
Chloride: 92 mEq/L — ABNORMAL LOW (ref 96–112)
GFR calc Af Amer: 4 mL/min — ABNORMAL LOW (ref >90)
GFR calc non Af Amer: 4 mL/min — ABNORMAL LOW (ref >90)
Glucose, Bld: 114 mg/dL — ABNORMAL HIGH (ref 70–99)
Potassium: 5 mEq/L (ref 3.7–5.3)
Sodium: 141 mEq/L (ref 137–147)

## 2014-01-30 LAB — CBC
HCT: 20.7 % — ABNORMAL LOW (ref 39.0–52.0)
Hemoglobin: 6.8 g/dL — CL (ref 13.0–17.0)
MCH: 28.8 pg (ref 26.0–34.0)
MCHC: 32.9 g/dL (ref 30.0–36.0)
MCV: 87.7 fL (ref 78.0–100.0)
Platelets: 362 10*3/uL (ref 150–400)
RBC: 2.36 MIL/uL — AB (ref 4.22–5.81)
RDW: 15.7 % — ABNORMAL HIGH (ref 11.5–15.5)
WBC: 11.4 10*3/uL — ABNORMAL HIGH (ref 4.0–10.5)

## 2014-01-30 LAB — GLUCOSE, CAPILLARY
Glucose-Capillary: 107 mg/dL — ABNORMAL HIGH (ref 70–99)
Glucose-Capillary: 114 mg/dL — ABNORMAL HIGH (ref 70–99)
Glucose-Capillary: 106 mg/dL — ABNORMAL HIGH (ref 70–99)
Glucose-Capillary: 124 mg/dL — ABNORMAL HIGH (ref 70–99)

## 2014-01-30 LAB — HEMOGLOBIN A1C
Hgb A1c MFr Bld: 5.5 % (ref <5.7)
Mean Plasma Glucose: 111 mg/dL (ref <117)

## 2014-01-30 MED ORDER — OXYCODONE HCL 5 MG PO TABS
5.0000 mg | ORAL_TABLET | ORAL | Status: DC | PRN
  Administered 2014-01-30 – 2014-01-31 (×2): 5 mg via ORAL
  Filled 2014-01-30 (×3): qty 1

## 2014-01-30 MED ORDER — CEFAZOLIN SODIUM-DEXTROSE 2-3 GM-% IV SOLR
2.0000 g | INTRAVENOUS | Status: AC
  Administered 2014-01-31: 2 g via INTRAVENOUS
  Filled 2014-01-30: qty 50

## 2014-01-30 MED ORDER — ACETAMINOPHEN 325 MG PO TABS
650.0000 mg | ORAL_TABLET | Freq: Four times a day (QID) | ORAL | Status: DC | PRN
  Administered 2014-01-30 – 2014-02-09 (×4): 650 mg via ORAL
  Filled 2014-01-30 (×4): qty 2

## 2014-01-30 MED ORDER — CARVEDILOL 3.125 MG PO TABS
3.1250 mg | ORAL_TABLET | Freq: Two times a day (BID) | ORAL | Status: DC
  Administered 2014-01-30 – 2014-02-06 (×12): 3.125 mg via ORAL
  Filled 2014-01-30 (×18): qty 1

## 2014-01-30 NOTE — Progress Notes (Signed)
Pt co all over pain "crick in neck".  Paged Dr. Maryland Pink for something for pain.  Nothing ordered at this time.

## 2014-01-30 NOTE — Progress Notes (Signed)
[facility] PROGRESS NOTE  NATHIN SARAN NLG:[id number] DOB: [birth year] DOA: 01/13/2014 PCP: No primary provider on file.  Interim Summary: Patient is a 73 year old male with past medical history hypertension diabetes who presented to the emergency room on 5/27 with complaints of nausea and vomiting x2 days. Number exam he was found to be in severe acute renal failure with a creatinine of 9.36, potassium greater than 7.7 and was hypotensive. Patient noted to be hypoxic, likely from aspiration pneumonia from vomiting with volume overload and was started on BiPAP. He was admitted to the critical care service. Nephrology was consulted. Patient was started on pressor support, CVVH and was intubated.  ARF felt to be multifactorial from hypovolemia, NSAID-induced nephropathy plus ACE inhibitor. Also high anion gap metabolic acidosis noted with metformin as contributing factor. Renal ultrasound notes medical renal disease. Chest x-ray noted pneumonia and/or pulmonary edema. Patient treated as septic shock with pressor support. Mildly elevated troponin plus questionable EKG changes, felt to have mild non-STEMI secondary to demand ischemia. Started on tube feedings.  Pt improved & extubated 6/1.  Able to be weaned off pressors and stress dose steroids. Antibiotics completed 6/3.  Patient continued on tube feedings as there was concerns about chronic aspiration due to to decreased mentation even after extubation. Patient failed initial swallow eval 6/3. CERT stopped 6/2 and hemodialysis started this patient's kidney function did not improve. Transfer to the hospitalist service on 6/5 on the floor.  Patient's mentation continued to improve. Speech therapy continue to follow and after modified barium swallow on 6/9, patient upgraded to dysphagia 3 diet. Patient intermittently dialyzed with hopes renal function will improve.  The patient's last dialysis was 6/8. He is currently making urine.  However, dialysis  catheter accidentally removed by patient on 6/11 early morning. Current plan to use IV fluids and if hemodialysis needed again, replacement of catheter. Once it is determined whether patient will need dialysis long-term, he will go to skilled nursing facility.  Assessment/Plan: Active Problems:   Hypotensive/Septic shock: Resolved. Patient met criteria given hypotension, end organ damage and required pressor support. Source felt to be pneumonia. Completed antibiotic course. Secondary to aspiration    Acute renal failure now in the setting of chronic renal disease. As mentioned above, patient making urine. Monitoring closely. Nephrology following. Creatinine today at 12.04. Hydrated with IV fluids. Currently no dialysis catheter access    Metabolic acidosis: Secondary to uremia. Initially from septic shock.    Hyperkalemia: Secondary renal failure. Initially treated with dialysis. His potassium stays elevated, will use Kayexalate.    Thrombocytopenia: Resolved. Secondary to thrombocytopenia critical illness. Improve.    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 Hemoglobin today down to 6.8. Will discuss with nephrology and likely transfuse 1 unit packed red blood cells.    Aspiration pneumonia: As above. Currently on restricted diet. Completed antibiotic course.    Diabetes mellitus: CBG stable. On sliding scale only.  Dysphagia: In part due to mentation. Currently at dysphagia 3 with nectar thick liquids.  Speech therapy will continue to follow, even at skilled nursing facility. Patient has history of presbyesophagus.  HTN: Slightly elevated. We'll start beta blocker.  Hyperlipidemia  Diastolic heart failure: Echocardiogram done in the setting of? Non-STEMI.Grade 2 diastolic dysfunction noted. Once renal function assessed, we'll manage it with dialysis versus diuretics. Low dose Coreg initiated.  Patient's weight at 192 on admission and as high as 205 down to 172 on 6/8. Has  been trending up and is currently at  184.   NSTEMI: Demand ischemia in the setting of septic shock. Resolved.  Toe ulceration: Patient was scheduled by orthopedic surgery to have planned left fourth toe amputation done 6/25. Hospitalist left message for need for postponement  Code Status: Full code Family Communication: Left message with wife.  Disposition Plan: For skilled nursing facility once renal disposition made   Consultants:  Nephrology  Critical Care  Procedures:  Status post intubation on 5/28-extubated 6/1  Temporary dialysis catheter placed 5/28  CVVH started 5/20  Echo 5/28 >> EF 65 to 28%, grade 2 diastolic dysfx, mod AS    Antibiotics: Zosyn 5/27 >>> 6/03  Vancomycin 5/27 >>> 6/02  HPI/Subjective: Patient doing okay. Complains of pain in his neck as he was sleeping in a bad position.  Objective: Filed Vitals:   01/30/14 0945  BP: 150/77  Pulse: 88  Temp: 99.5 F (37.5 C)  Resp: 17    Intake/Output Summary (Last 24 hours) at 01/30/14 1119 Last data filed at 01/30/14 0300  Gross per 24 hour  Intake 2552.92 ml  Output      0 ml  Net 2552.92 ml   Filed Weights   01/27/14 2135 01/29/14 0454 01/30/14 0639  Weight: 83.2 kg (183 lb 6.8 oz) 83.6 kg (184 lb 4.9 oz) 83.6 kg (184 lb 4.9 oz)    Exam:   General:  Complains of neck pain, upset about that. Denies any shortness of breath  Cardiovascular: Regular rate and rhythm, 3/6 holosystolic murmur  Respiratory: Clear to auscultation bilaterally  Abdomen: Soft, nontender, nondistended, hypoactive bowel sounds  Musculoskeletal: Clubbing or cyanosis, 1+ pitting edema bilaterally   Data Reviewed: Basic Metabolic Panel:  Recent Labs Lab 01/26/14 0445 01/27/14 0620 01/28/14 0700 01/29/14 0640 01/30/14 0451  NA 138 142 142 142 141  K 3.8 5.0 5.5* 6.1* 5.0  CL 92* 94* 94* 92* 92*  CO2 _0 GLUCOSE 134* 114* 139* 100* 114*  BUN 72* 103* 131* 152* 155*  CREATININE 6.16* 8.46*  10.26* 11.74* 12.04*  CALCIUM 9.1 9.1 9.0 8.5 7.6*   Liver Function Tests: No results found for this basename: AST, ALT, ALKPHOS, BILITOT, PROT, ALBUMIN,  in the last 168 hours No results found for this basename: LIPASE, AMYLASE,  in the last 168 hours No results found for this basename: AMMONIA,  in the last 168 hours CBC:  Recent Labs Lab 01/24/14 0324 01/25/14 0715 01/26/14 0445 01/28/14 0700 01/30/14 0451  WBC 17.6* 16.1* 14.3* 13.0* 11.4*  HGB 8.6* 8.3* 7.9* 7.7* 6.8*  HCT 26.2* 25.3* 24.6* 23.7* 20.7*  MCV 88.5 86.1 89.5 88.8 87.7  PLT 405* 461* 394 413* 362   Cardiac Enzymes: No results found for this basename: CKTOTAL, CKMB, CKMBINDEX, TROPONINI,  in the last 168 hours BNP (last 3 results)  Recent Labs  01/13/14 2125  PROBNP 35530.0*   CBG:  Recent Labs Lab 01/29/14 0732 01/29/14 1200 01/29/14 1734 01/29/14 2114 01/30/14 0805  GLUCAP 101* 143* 133* 133* 107*    No results found for this or any previous visit (from the past 240 hour(s)).   Studies: No results found.  Scheduled Meds: . antiseptic oral rinse  15 mL Mouth Rinse BID  . aspirin  81 mg Oral Daily  . feeding supplement (NEPRO CARB STEADY)  237 mL Oral TID WC  . insulin aspart  0-24 Units Subcutaneous TID WC  . pantoprazole  40 mg Oral QAC breakfast   Continuous Infusions: . sodium chloride 125 mL/hr at  01/29/14 1834    Active Problems:   Severe sepsis   Acute renal failure   Metabolic acidosis   Hyperkalemia   Thrombocytopenia   Anemia   Aspiration pneumonia   Diabetes mellitus    Time spent: 40 minutes    [facility] Pager [phone]. If 7PM-7AM, please contact night-coverage at [email or web address], password [facility] 01/30/2014, 11:19 AM  LOS: 17 days

## 2014-01-30 NOTE — Progress Notes (Addendum)
Assessment  1. AKI 2/2 ATN related to NSAID, ACEi, hypovolemia, PNA, Septic shock: CRRT 5/28-6/2, hx of stage 3 CKD 2. Anemia:, needs blood 3. Hyperkalemia  Plan: For PC   Subjective: Interval History: C/o neck and back ache  Objective: Vital signs in last 24 hours: Temp:  [99.1 F (37.3 C)-99.7 F (37.6 C)] 99.5 F (37.5 C) (06/13 0945) Pulse Rate:  [88-99] 88 (06/13 0945) Resp:  [17-22] 17 (06/13 0945) BP: (150-172)/(72-82) 150/77 mmHg (06/13 0945) SpO2:  [93 %-96 %] 95 % (06/13 0945) Weight:  [83.6 kg (184 lb 4.9 oz)] 83.6 kg (184 lb 4.9 oz) (06/13 0639) Weight change: 0 kg (0 lb)  Intake/Output from previous day: 06/12 0701 - 06/13 0700 In: 2912.9 [P.O.:840; I.V.:2072.9] Out: -  Intake/Output this shift:    General appearance: alert and cooperative GI: soft, non-tender; bowel sounds normal; no masses,  no organomegaly Extremities: edema tr Lungs clr  Lab Results:  Recent Labs  01/28/14 0700 01/30/14 0451  WBC 13.0* 11.4*  HGB 7.7* 6.8*  HCT 23.7* 20.7*  PLT 413* 362   BMET:  Recent Labs  01/29/14 0640 01/30/14 0451  NA 142 141  K 6.1* 5.0  CL 92* 92*  CO2 19 20  GLUCOSE 100* 114*  BUN 152* 155*  CREATININE 11.74* 12.04*  CALCIUM 8.5 7.6*   No results found for this basename: PTH,  in the last 72 hours Iron Studies: No results found for this basename: IRON, TIBC, TRANSFERRIN, FERRITIN,  in the last 72 hours Studies/Results: No results found.  Scheduled: . antiseptic oral rinse  15 mL Mouth Rinse BID  . aspirin  81 mg Oral Daily  . carvedilol  3.125 mg Oral BID WC  . feeding supplement (NEPRO CARB STEADY)  237 mL Oral TID WC  . insulin aspart  0-24 Units Subcutaneous TID WC  . pantoprazole  40 mg Oral QAC breakfast     LOS: 17 days   Markie Frith C 01/30/2014,2:04 PM

## 2014-01-30 NOTE — Progress Notes (Signed)
CRITICAL VALUE ALERT  Critical value received:  Hemoglobin 6.8  Date of notification:  01/30/2014  Time of notification:  0603  Critical value read back:yes  Nurse who received alert:  Maggie Font, RN   MD notified (1st page):  Walden Field, NP   Time of first page:  [phone]  Responding MD:  Walden Field, NP   Time MD responded:  [phone]  Comments:      Collected: 01/30/14 0451   Resulting lab: [facility]   Reference range: 13.0 - 17.0 g/dL   Value: 6.8 (Low Panic)   Comment: REPEATED TO VERIFY    CRITICAL RESULT CALLED TO, READ BACK BY AND VERIFIED WITH:    R. HILL RN [phone] Cesar Chavez

## 2014-01-30 NOTE — Progress Notes (Signed)
Patient ID: [name], male   DOB: 1941/06/26, 73 y.o.   MRN: [id number] Request received from nephrology for tunneled HD cath placement in pt with AKI secondary to NSAIDS, ACE inhibitors, volume depletion, recent asp.PNA (treated), septic shock/VDRF/ARDS. Pt underwent left IJ temp cath placement by CCM 5/28 and had previously been on CRRT. Last dialysis was on 6/8. HD catheter was accidentally removed by pt on  6/11. Current creat is 12.04. Additional  PMH as below. Exam: pt awake/alert; chest-CTA bilat ant ; heart- RRR with 3/6 murmur; abd- soft,+BS,NT; ext- 1+ edema bilat LE's.    Filed Vitals:   01/29/14 2120 01/30/14 0535 01/30/14 0639 01/30/14 0945  BP: 172/82 152/72  150/77  Pulse: 99 90  88  Temp: 99.7 F (37.6 C) 99.2 F (37.3 C)  99.5 F (37.5 C)  TempSrc: Oral Oral  Oral  Resp: 18 20  17   Height:      Weight:   184 lb 4.9 oz (83.6 kg)   SpO2: 93% 94%  95%   Past Medical History  Diagnosis Date  . Diabetes mellitus without complication   . Hypertension   . Hypercholesteremia    Past Surgical History  Procedure Laterality Date  . Prostatectomy    . Colon resection     Dg Abd 1 View  01/23/2014   CLINICAL DATA:  Feeding tube placement.  EXAM: ABDOMEN - 1 VIEW  COMPARISON:  01/21/2014  FINDINGS: Feeding tube tip is seen with tip in the fundus of the stomach. Some residual contrast is seen within the colon. No dilated bowel loops seen.  IMPRESSION: Feeding tube tip in fundus of stomach.   Electronically Signed   By: Earle Gell M.D.   On: 01/23/2014 18:57   Dg Esophagus  01/26/2014   CLINICAL DATA:  Dysphagia. Modified swallow earlier in the morning only clearing patient for nectar thickness.  EXAM: ESOPHOGRAM/BARIUM SWALLOW  TECHNIQUE: Single contrast examination was performed using  thick barium.  FLUOROSCOPY TIME:  3 min and 20 is seconds  COMPARISON:  Chest radiograph 01/19/2014.  FINDINGS: Focused single-contrast exam was performed in a supine, mildly LPO position. The bed  was maintained partially upright.  Evaluation of a single swallow demonstrates contrast stasis throughout the mid and upper thoracic esophagus.  Full column evaluation of the esophagus demonstrates no persistent narrowing or stricture.  13 mm barium tablet has initial delay in the vallecula. On subsequent swallows, the tablet passes into the esophagus and then into the stomach.  IMPRESSION: 1. Moderately degraded exam. 2. 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 3. No evidence of  stricture other anatomic cause for dysphagia. 4. Delayed passage of the barium tablet at the level of the vallecula.   Electronically Signed   By: Abigail Miyamoto M.D.   On: 01/26/2014 16:14   US Renal  01/14/2014   CLINICAL DATA:  Renal failure, history hypertension, diabetes  EXAM: RENAL/URINARY TRACT ULTRASOUND COMPLETE  COMPARISON:  None  FINDINGS: Right Kidney:  Length: 12.4 cm length. Cortical thickness up to 1.8 cm thick. Increased cortical echogenicity. Hypoechoic nodule medial aspect mid RIGHT kidney 2.1 x 2.2 x 2.1 cm containing scattered internal echoes question complicated cyst. No additional mass, hydronephrosis or shadowing calcification.  Left Kidney:  Length: 10.5 cm length. Cortical thickness up to 2.0 cm thick. Increased cortical echogenicity. Minimally complicated cyst at mid LEFT kidney 2.7 x 3.3 x 3.7 cm containing slightly irregular wall and scattered internal echoes. No additional mass, hydronephrosis or shadowing calcification.  Bladder:  Contains only minimal urine. Minimally enlarged central lobe of prostate.  IMPRESSION: Increased renal cortical echogenicity bilaterally consistent with medical renal disease.  Mildly complicated cysts within the mid portions of both kidneys.  No evidence of hydronephrosis.   Electronically Signed   By: Lavonia Dana M.D.   On: 01/14/2014 02:31   Dg Chest Port 1 View  01/19/2014   CLINICAL DATA:  Cough  EXAM: PORTABLE CHEST - 1 VIEW  COMPARISON:  01/18/2014  FINDINGS:  Cardiac shadow is stable. A right jugular line and left jugular temporary dialysis catheter are again seen and stable. The endotracheal tube and nasogastric catheter have been removed. The lungs are well aerated bilaterally. Improved aeration is again seen bilaterally with persistent changes in the right lung base. No new focal infiltrate is seen.  IMPRESSION: Improved aeration with some persistent changes in the right base.  Tubes and lines as described.   Electronically Signed   By: Inez Catalina M.D.   On: 01/19/2014 06:53   Dg Chest Port 1 View  01/18/2014   CLINICAL DATA:  Check endotracheal tube position.  EXAM: PORTABLE CHEST - 1 VIEW  COMPARISON:  01/16/2014.  FINDINGS: Endotracheal tube terminates approximately 3.1 cm above the carina. Nasogastric tube is followed into the stomach. Right IJ central line tip projects over the low SVC. Left IJ catheter tip projects over the brachiocephalic vein confluence or upper SVC. Heart is enlarged, stable. Thoracic aorta is calcified. There is mild-to-moderate diffuse bilateral airspace disease with slight improvement in aeration at the right lung base. No definite pleural fluid.  IMPRESSION: Mild to moderate diffuse bilateral airspace disease, likely due to pulmonary edema. Slight improvement in aeration at the right lung base.   Electronically Signed   By: Lorin Picket M.D.   On: 01/18/2014 07:40   Dg Chest Port 1 View  01/16/2014   CLINICAL DATA:  Aspiration pneumonia  EXAM: PORTABLE CHEST - 1 VIEW  COMPARISON:  01/15/2014  FINDINGS: Endotracheal tube ends in the mid thoracic trachea. Unchanged positioning of bilateral IJ catheters, tips at the level of the SVC. Enteric tube enters the stomach.  Bibasilar airspace disease shows no progression or clearing. No evidence of increasing pleural fluid or air leak.  IMPRESSION: 1. Stable positioning of tubes and lines. 2. Unchanged bibasilar pneumonia.   Electronically Signed   By: Jorje Guild M.D.   On:  01/16/2014 07:18   Dg Chest Port 1 View  01/15/2014   CLINICAL DATA:  Ventilator support.  Pneumonia.  EXAM: PORTABLE CHEST - 1 VIEW  COMPARISON:  01/14/2014  FINDINGS: Endotracheal tube has its tip 4 cm above the carina. Nasogastric tube enters the stomach. Right internal jugular central line has its tip in the SVC above the right atrium. Left internal jugular central line tips are in the SVC above the right atrium. Bilateral lower lobe airspace filling consistent with pneumonia persists, probably slightly improved since yesterday. The upper lungs remain clear. No effusions.  IMPRESSION: Lines and tubes well positioned. Slight improvement in bilateral lower lobe pneumonia.   Electronically Signed   By: Nelson Chimes M.D.   On: 01/15/2014 07:35   Dg Chest Port 1 View  01/14/2014   CLINICAL DATA:  Status post central line placement  EXAM: PORTABLE CHEST - 1 VIEW  COMPARISON:  01/14/2014 225 hr  FINDINGS: A left jugular dual-lumen central line is again identified and stable. A new right jugular central line is seen with the catheter tip at the cavoatrial  junction. No pneumothorax is noted. The endotracheal tube is seen 5 cm above the carina. A nasogastric catheter is noted extending into the stomach. Bilateral lower lung infiltrates are seen. The overall appearance is stable. No new focal abnormality is noted.  IMPRESSION: Stable bilateral infiltrates.  New tubes and lines as described above without complicating factors.   Electronically Signed   By: Inez Catalina M.D.   On: 01/14/2014 09:33   Dg Chest Port 1 View  01/14/2014   CLINICAL DATA:  Hemodialysis catheter placement  EXAM: Portable exam 0225 hr compared to 01/13/2014  COMPARISON:  None.  FINDINGS: LEFT jugular dual-lumen central venous catheter with tip projecting over mid SVC.  Enlargement of cardiac silhouette with pulmonary vascular congestion.  Perihilar and basilar infiltrates which could represent edema or infection.  No pneumothorax.  Bones  unremarkable.  IMPRESSION: No pneumothorax following LEFT jugular line placement.  Enlargement of cardiac silhouette with pulmonary vascular congestion and diffuse increased pulmonary infiltrates, question edema versus infection.   Electronically Signed   By: Lavonia Dana M.D.   On: 01/14/2014 02:41   Dg Chest Port 1 View  01/13/2014   CLINICAL DATA:  73 year old male with back and knee pain. Shortness of Breath. Initial encounter.  EXAM: PORTABLE CHEST - 1 VIEW  COMPARISON:  11/21/2007.  FINDINGS: Portable AP supine view at 2211 hrs. New confluent bibasilar pulmonary opacity. No pneumothorax. Pulmonary vascularity within normal limits. Stable cardiac size and mediastinal contours. Visualized tracheal air column is within normal limits.  IMPRESSION: New confluent bibasilar pulmonary opacity, nonspecific but consider bilateral pneumonia or aspiration.   Electronically Signed   By: Lars Pinks M.D.   On: 01/13/2014 23:02   Dg Abd Portable 1v  01/23/2014   CLINICAL DATA:  Feeding tube placement.  EXAM: PORTABLE ABDOMEN - 1 VIEW  COMPARISON:  01/23/2014  FINDINGS: The Dobbhoff feeding tube is looped back on itself in the descending duodenum. The tip is in the antropyloric region of the stomach.  IMPRESSION: Dobbhoff feeding tube is looped back on itself in the descending duodenum.   Electronically Signed   By: Kalman Jewels M.D.   On: 01/23/2014 23:44   Dg Abd Portable 1v  01/21/2014   CLINICAL DATA:  Feeding tube placement.  EXAM: PORTABLE ABDOMEN - 1 VIEW  COMPARISON:  None.  FINDINGS: Weighted tip feeding tube is present in the proximal gastric fundus. Barium is present in small bowel. The bowel gas pattern is nonobstructive. Gaseous distention of the colon.  IMPRESSION: Weighted tip feeding tube in the proximal gastric fundus.   Electronically Signed   By: Dereck Ligas M.D.   On: 01/21/2014 16:32  LABS: 01/30/2014  K 5.0  Creat 12.04   WBC 11.4  HGB 6.8 PLTS 362K                                                                                                   A/P: Pt with hx HTN,DM, AKI secondary to NSAIDS/ACE inhibitor/volume depletion and recent asp PNA/VDRF/ARDS- currently extubated. He is s/p CRRT via previously placed left IJ temp cath which was inadvertently removed by pt 6/11 . Creat cont to increase and plans are now made to place tunneled HD cath on 6/14. Details/risks of procedure d/w pt with his understanding and consent. Pt will most likely need transfusion with hgb 6.8 today.

## 2014-01-31 ENCOUNTER — Inpatient Hospital Stay (HOSPITAL_COMMUNITY): Payer: Medicare Other

## 2014-01-31 DIAGNOSIS — I10 Essential (primary) hypertension: Secondary | ICD-10-CM

## 2014-01-31 LAB — GLUCOSE, CAPILLARY
GLUCOSE-CAPILLARY: 89 mg/dL (ref 70–99)
GLUCOSE-CAPILLARY: 106 mg/dL — AB (ref 70–99)

## 2014-01-31 LAB — BASIC METABOLIC PANEL
BUN: 166 mg/dL — AB (ref 6–23)
CO2: 17 mEq/L — ABNORMAL LOW (ref 19–32)
CREATININE: 12.55 mg/dL — AB (ref 0.50–1.35)
Calcium: 7.4 mg/dL — ABNORMAL LOW (ref 8.4–10.5)
Chloride: 91 mEq/L — ABNORMAL LOW (ref 96–112)
GFR, EST AFRICAN AMERICAN: 4 mL/min — AB (ref >90)
GFR, EST NON AFRICAN AMERICAN: 3 mL/min — AB (ref >90)
Glucose, Bld: 98 mg/dL (ref 70–99)
POTASSIUM: 5.2 meq/L (ref 3.7–5.3)
Sodium: 140 mEq/L (ref 137–147)

## 2014-01-31 LAB — CBC
HCT: 20.1 % — ABNORMAL LOW (ref 39.0–52.0)
HEMOGLOBIN: 6.8 g/dL — AB (ref 13.0–17.0)
MCH: 29.4 pg (ref 26.0–34.0)
MCHC: 33.8 g/dL (ref 30.0–36.0)
MCV: 87 fL (ref 78.0–100.0)
Platelets: 345 10*3/uL (ref 150–400)
RBC: 2.31 MIL/uL — ABNORMAL LOW (ref 4.22–5.81)
RDW: 15.5 % (ref 11.5–15.5)
WBC: 10.9 10*3/uL — ABNORMAL HIGH (ref 4.0–10.5)

## 2014-01-31 LAB — PROTIME-INR
INR: 1.27 (ref 0.00–1.49)
Prothrombin Time: 15.6 seconds — ABNORMAL HIGH (ref 11.6–15.2)

## 2014-01-31 LAB — ABO/RH: ABO/RH(D): A POS

## 2014-01-31 LAB — APTT
aPTT: >200 seconds (ref 24–37)
aPTT: 34 seconds (ref 24–37)

## 2014-01-31 LAB — PREPARE RBC (CROSSMATCH)

## 2014-01-31 MED ORDER — FENTANYL CITRATE 0.05 MG/ML IJ SOLN
INTRAMUSCULAR | Status: AC | PRN
  Administered 2014-01-31: 25 ug via INTRAVENOUS

## 2014-01-31 MED ORDER — LIDOCAINE-PRILOCAINE 2.5-2.5 % EX CREA: 1.0000 "application " | TOPICAL_CREAM | CUTANEOUS | Status: DC | PRN

## 2014-01-31 MED ORDER — MIDAZOLAM HCL 2 MG/2ML IJ SOLN
INTRAMUSCULAR | Status: AC
  Filled 2014-01-31: qty 4

## 2014-01-31 MED ORDER — ALTEPLASE 2 MG IJ SOLR
2.0000 mg | Freq: Once | INTRAMUSCULAR | Status: AC | PRN
  Filled 2014-01-31: qty 2

## 2014-01-31 MED ORDER — SODIUM CHLORIDE 0.9 % IV SOLN
INTRAVENOUS | Status: AC | PRN
  Administered 2014-01-31: 30 mL/h via INTRAVENOUS

## 2014-01-31 MED ORDER — SODIUM CHLORIDE 0.9 % IV SOLN: 100.0000 mL | INTRAVENOUS | Status: DC | PRN

## 2014-01-31 MED ORDER — HEPARIN SODIUM (PORCINE) 1000 UNIT/ML IJ SOLN
INTRAMUSCULAR | Status: AC
  Filled 2014-01-31: qty 1

## 2014-01-31 MED ORDER — PENTAFLUOROPROP-TETRAFLUOROETH EX AERO
1.0000 | INHALATION_SPRAY | CUTANEOUS | Status: DC | PRN
Start: 2014-01-31 — End: 2014-02-02

## 2014-01-31 MED ORDER — MIDAZOLAM HCL 2 MG/2ML IJ SOLN
INTRAMUSCULAR | Status: AC | PRN
  Administered 2014-01-31: 0.5 mg via INTRAVENOUS

## 2014-01-31 MED ORDER — HEPARIN SODIUM (PORCINE) 1000 UNIT/ML DIALYSIS
1000.0000 [IU] | INTRAMUSCULAR | Status: DC | PRN
  Filled 2014-01-31: qty 1

## 2014-01-31 MED ORDER — LIDOCAINE HCL (PF) 1 % IJ SOLN
5.0000 mL | INTRAMUSCULAR | Status: DC | PRN
Start: 2014-01-31 — End: 2014-02-02

## 2014-01-31 MED ORDER — FENTANYL CITRATE 0.05 MG/ML IJ SOLN
INTRAMUSCULAR | Status: AC
  Filled 2014-01-31: qty 4

## 2014-01-31 MED ORDER — NEPRO/CARBSTEADY PO LIQD
237.0000 mL | ORAL | Status: DC | PRN
Start: 2014-01-31 — End: 2014-02-02

## 2014-01-31 MED ORDER — HEPARIN SODIUM (PORCINE) 1000 UNIT/ML DIALYSIS
20.0000 [IU]/kg | INTRAMUSCULAR | Status: DC | PRN
  Filled 2014-01-31: qty 2

## 2014-01-31 NOTE — Progress Notes (Signed)
CRITICAL VALUE ALERT  Critical value received:  INR- Greater than 10; PTT- greater than 200  Date of notification:  01/31/2014  Time of notification:  0709  Critical value read back:yes  Nurse who received alert:  Maggie Font, RN   MD notified (1st page): Gevena Barre  Time of first page:  0710  Responding MD:  Gevena Barre  Time MD responded:  [phone]  Comments:    Collected: 01/31/14 0530   Resulting lab: [facility]   Reference range: 24 - 37 seconds   Value: >200 (High Panic)   Comment:     IF BASELINE aPTT IS ELEVATED,    SUGGEST PATIENT RISK ASSESSMENT    BE USED TO DETERMINE APPROPRIATE    ANTICOAGULANT THERAPY.    REPEATED TO VERIFY    CRITICAL RESULT CALLED TO, READ BACK BY AND VERIFIED WITH:    R HILL,RN 01/31/14 0709 RHOLMES       Collected: 01/31/14 0530   Resulting lab: [facility]   Reference range: 0.00 - 1.49   Value: >10.00 (High Panic)   Comment: REPEATED TO VERIFY    CRITICAL RESULT CALLED TO, READ BACK BY AND VERIFIED WITH:    R HILL,RN 01/31/14 0709 RHOLMES

## 2014-01-31 NOTE — Progress Notes (Signed)
Assessment  1. AKI 2/2 ATN related to NSAID, ACEi, hypovolemia, PNA, Septic shock: CRRT 5/28-6/2, hx of stage 3 CKD.  May be ESRD now, will so how things go. 2. Anemia:, needs blood 3. Hyperkalemia 4.  s/p PC Plan: For HD for PBCs and azotemia   Subjective: Interval History: s/p PC placement  Objective: Vital signs in last 24 hours: Temp:  [98 F (36.7 C)-99 F (37.2 C)] 99 F (37.2 C) (06/14 0948) Pulse Rate:  [80-102] 97 (06/14 1124) Resp:  [18-95] 25 (06/14 1124) BP: (146-172)/(80-97) 154/86 mmHg (06/14 1124) SpO2:  [91 %-100 %] 92 % (06/14 1124) Weight:  [84.6 kg (186 lb 8.2 oz)] 84.6 kg (186 lb 8.2 oz) (06/14 0613) Weight change: 1 kg (2 lb 3.3 oz)  Intake/Output from previous day: 06/13 0701 - 06/14 0700 In: 480 [P.O.:480] Out: 100 [Urine:100] Intake/Output this shift:    vomiting post procedure Mod distress No le edema  Lab Results:  Recent Labs  01/30/14 0451 01/31/14 0530  WBC 11.4* 10.9*  HGB 6.8* 6.8*  HCT 20.7* 20.1*  PLT 362 345   BMET:  Recent Labs  01/30/14 0451 01/31/14 0530  NA 141 140  K 5.0 5.2  CL 92* 91*  CO2 20 17*  GLUCOSE 114* 98  BUN 155* 166*  CREATININE 12.04* 12.55*  CALCIUM 7.6* 7.4*   No results found for this basename: PTH,  in the last 72 hours Iron Studies: No results found for this basename: IRON, TIBC, TRANSFERRIN, FERRITIN,  in the last 72 hours Studies/Results: No results found.  Scheduled: . antiseptic oral rinse  15 mL Mouth Rinse BID  . aspirin  81 mg Oral Daily  . carvedilol  3.125 mg Oral BID WC  . feeding supplement (NEPRO CARB STEADY)  237 mL Oral TID WC  . fentaNYL      . heparin      . insulin aspart  0-24 Units Subcutaneous TID WC  . midazolam      . pantoprazole  40 mg Oral QAC breakfast       LOS: 18 days   Marbeth Smedley C 01/31/2014,1:33 PM

## 2014-01-31 NOTE — Progress Notes (Signed)
[facility] PROGRESS NOTE  NOBEL BRAR IOM:[id number] DOB: 1941/05/05 DOA: 01/13/2014 PCP: No primary provider on file.  Interim Summary: Patient is a 73 year old male with past medical history hypertension diabetes who presented to the emergency room on 5/27 with complaints of nausea and vomiting x2 days. Number exam he was found to be in severe acute renal failure with a creatinine of 9.36, potassium greater than 7.7 and was hypotensive. Patient noted to be hypoxic, likely from aspiration pneumonia from vomiting with volume overload and was started on BiPAP. He was admitted to the critical care service. Nephrology was consulted. Patient was started on pressor support, CVVH and was intubated.  ARF felt to be multifactorial from hypovolemia, NSAID-induced nephropathy plus ACE inhibitor. Also high anion gap metabolic acidosis noted with metformin as contributing factor. Renal ultrasound notes medical renal disease. Chest x-ray noted pneumonia and/or pulmonary edema. Patient treated as septic shock with pressor support. Mildly elevated troponin plus questionable EKG changes, felt to have mild non-STEMI secondary to demand ischemia. Started on tube feedings.  Pt improved & extubated 6/1.  Able to be weaned off pressors and stress dose steroids. Antibiotics completed 6/3.  Patient continued on tube feedings as there was concerns about chronic aspiration due to to decreased mentation even after extubation. Patient failed initial swallow eval 6/3. CERT stopped 6/2 and hemodialysis started this patient's kidney function did not improve. Transfer to the hospitalist service on 6/5 on the floor.  Patient's mentation continued to improve. Speech therapy continue to follow and after modified barium swallow on 6/9, patient upgraded to dysphagia 3 diet. Patient intermittently dialyzed with hopes renal function will improve.  The patient's last dialysis was 6/8. He is currently making urine.  However, dialysis  catheter accidentally removed by patient on 6/11 early morning. Catheter replaced on 6/14 by interventional radiology. Once it is determined whether patient will need dialysis long-term, he will go to skilled nursing facility.  Assessment/Plan: Active Problems:   Hypotensive/Septic shock: Resolved. Patient met criteria given hypotension, end organ damage and required pressor support. Source felt to be pneumonia. Completed antibiotic course. Secondary to aspiration    Acute renal failure now in the setting of chronic renal disease. As mentioned above, patient making urine. Monitoring closely. Nephrology following. Creatinine today at 12.04. Hydrated with IV fluids. Catheter replaced 6/14 with dialysis following    Metabolic acidosis: Secondary to uremia. Initially from septic shock.    Hyperkalemia: Secondary renal failure. Initially treated with dialysis. Manage potassium with dialysis or emergency Kayexalate if needed    Thrombocytopenia: Resolved. Secondary to thrombocytopenia critical illness. Improve.    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 Hemoglobin dropped to 6.8 on 6/13 and has persistently stayed there. For 2 units packed red blood cells during dialysis today    Aspiration pneumonia: As above. Currently on restricted diet. Completed antibiotic course.    Diabetes mellitus: CBG stable. On sliding scale only. A1c checked and found to be on the lower side at 5.5. At this point would discontinue metformin permanently given renal failure and try to manage with dietary control only  Dysphagia: In part due to mentation. Currently at dysphagia 3 with nectar thick liquids.  Speech therapy will continue to follow, even at skilled nursing facility. Patient has history of presbyesophagus.  HTN: Slightly elevated. Started on beta blocker 6/13 Hyperlipidemia  Diastolic heart failure: Echocardiogram done in the setting of ?Non-STEMI.  Grade 2 diastolic dysfunction noted. Once  renal function assessed, we'll manage it with dialysis  versus diuretics. Low dose Coreg initiated.  Patient's weight at 192 on admission and as high as 205 down to 172 on 6/8. Weight has been trending up since. Likely will come down after dialysis today. Monitor closely  NSTEMI: Demand ischemia in the setting of septic shock. Resolved.  Toe ulceration: Patient was scheduled by orthopedic surgery to have planned left fourth toe amputation done 6/25. Hospitalist left message for need for postponement  Code Status: Full code Family Communication: Attempted to call family, no answering machine, no one picked up  Disposition Plan: For skilled nursing facility once renal disposition made   Consultants:  Nephrology  Critical Care  Interventional radiology  Procedures:  Status post intubation on 5/28-extubated 6/1  Temporary dialysis catheter placed 5/28, patient removed it on 6/11  CVVH started 5/20  Echo 5/28 >> EF 65 to 67%, grade 2 diastolic dysfx, mod AS   Placement of dialysis catheter by interventional radiology on 6/14   Antibiotics: Zosyn 5/27 >>> 6/03  Vancomycin 5/27 >>> 6/02  HPI/Subjective: Patient doing okay. Continues to complain of some neck pain, but better with pillow adjustment and muscle relaxers  Objective: Filed Vitals:   01/31/14 1124  BP: 154/86  Pulse: 97  Temp:   Resp: 25    Intake/Output Summary (Last 24 hours) at 01/31/14 1212 Last data filed at 01/30/14 2344  Gross per 24 hour  Intake    480 ml  Output    100 ml  Net    380 ml   Filed Weights   01/29/14 0454 01/30/14 0639 01/31/14 0613  Weight: 83.6 kg (184 lb 4.9 oz) 83.6 kg (184 lb 4.9 oz) 84.6 kg (186 lb 8.2 oz)    Exam:   General:  Denies any shortness of breath. Some neck pain with movement to the side  Cardiovascular: Regular rate and rhythm, 3/6 holosystolic murmur, borderline tachycardia  Respiratory: Clear to auscultation bilaterally, poor inspiratory  effort  Abdomen: Soft, nontender, nondistended, hypoactive bowel sounds  Musculoskeletal: Clubbing or cyanosis, 1+ pitting edema bilaterally   Data Reviewed: Basic Metabolic Panel:  Recent Labs Lab 01/27/14 0620 01/28/14 0700 01/29/14 0640 01/30/14 0451 01/31/14 0530  NA 142 142 142 141 140  K 5.0 5.5* 6.1* 5.0 5.2  CL 94* 94* 92* 92* 91*  CO2 23 22 19 20  17*  GLUCOSE 114* 139* 100* 114* 98  BUN 103* 131* 152* 155* 166*  CREATININE 8.46* 10.26* 11.74* 12.04* 12.55*  CALCIUM 9.1 9.0 8.5 7.6* 7.4*   Liver Function Tests: No results found for this basename: AST, ALT, ALKPHOS, BILITOT, PROT, ALBUMIN,  in the last 168 hours No results found for this basename: LIPASE, AMYLASE,  in the last 168 hours No results found for this basename: AMMONIA,  in the last 168 hours CBC:  Recent Labs Lab 01/25/14 0715 01/26/14 0445 01/28/14 0700 01/30/14 0451 01/31/14 0530  WBC 16.1* 14.3* 13.0* 11.4* 10.9*  HGB 8.3* 7.9* 7.7* 6.8* 6.8*  HCT 25.3* 24.6* 23.7* 20.7* 20.1*  MCV 86.1 89.5 88.8 87.7 87.0  PLT 461* 394 413* 362 345   Cardiac Enzymes: No results found for this basename: CKTOTAL, CKMB, CKMBINDEX, TROPONINI,  in the last 168 hours BNP (last 3 results)  Recent Labs  01/13/14 2125  PROBNP 35530.0*   CBG:  Recent Labs Lab 01/30/14 0805 01/30/14 1142 01/30/14 1724 01/30/14 2058 01/31/14 0757  GLUCAP 107* 114* 106* 124* 89    No results found for this or any previous visit (from the past 240 hour(s)).  Studies: No results found.  Scheduled Meds: . antiseptic oral rinse  15 mL Mouth Rinse BID  . aspirin  81 mg Oral Daily  . carvedilol  3.125 mg Oral BID WC  . feeding supplement (NEPRO CARB STEADY)  237 mL Oral TID WC  . fentaNYL      . heparin      . insulin aspart  0-24 Units Subcutaneous TID WC  . midazolam      . pantoprazole  40 mg Oral QAC breakfast   Continuous Infusions:    Principal Problem:   Acute renal failure Active Problems:   Severe  sepsis   Metabolic acidosis   Hyperkalemia   Thrombocytopenia   Anemia   Aspiration pneumonia   Diabetes mellitus   Dysphagia   HTN (hypertension)   Other and unspecified hyperlipidemia   Overweight   Aortic stenosis, moderate   Chronic diastolic heart failure   NSTEMI (non-ST elevated myocardial infarction)   Toe ulcer    Time spent: 25 minutes    [facility] Pager [phone]. If 7PM-7AM, please contact night-coverage at [email or web address], password [facility] 01/31/2014, 12:12 PM  LOS: 18 days

## 2014-01-31 NOTE — Progress Notes (Signed)
Paged Dr. Florene Glen for type and cross order for blood to be given in dialysis.

## 2014-01-31 NOTE — Progress Notes (Signed)
Pt back from HD placement paged Dr. Maryland Pink for diet order and asked if pt is going to dialysis and receive blood today. Dr. Maryland Pink returned call page nephrology for dialysis and blood orders and let them know he is back.

## 2014-01-31 NOTE — Progress Notes (Signed)
Paged Dr. Wynonia Lawman Kidney [phone]. Dr. Florene Glen stated he would take care of the dialysis and blood orders.

## 2014-01-31 NOTE — Sedation Documentation (Signed)
O2 d/c'd 

## 2014-01-31 NOTE — Progress Notes (Signed)
Dr.Powell T/O type and cross.

## 2014-01-31 NOTE — Sedation Documentation (Addendum)
Back on bed, changed clothes for urinary incontinence.  Brief episode of nausea once back on bed.

## 2014-01-31 NOTE — Progress Notes (Signed)
Pt repeat PT/INR 1.27/15.6 paged Dr. Roxine Caddy

## 2014-01-31 NOTE — Procedures (Signed)
Placement of right jugular dialysis catheter.  Tip in lower SVC.  Catheter is ready to use.

## 2014-02-01 LAB — TYPE AND SCREEN
ABO/RH(D): A POS
Antibody Screen: NEGATIVE
UNIT DIVISION: 0
Unit division: 0

## 2014-02-01 LAB — GLUCOSE, CAPILLARY
GLUCOSE-CAPILLARY: 120 mg/dL — AB (ref 70–99)
Glucose-Capillary: 131 mg/dL — ABNORMAL HIGH (ref 70–99)
Glucose-Capillary: 117 mg/dL — ABNORMAL HIGH (ref 70–99)
Glucose-Capillary: 116 mg/dL — ABNORMAL HIGH (ref 70–99)
Glucose-Capillary: 117 mg/dL — ABNORMAL HIGH (ref 70–99)

## 2014-02-01 LAB — BASIC METABOLIC PANEL
BUN: 83 mg/dL — ABNORMAL HIGH (ref 6–23)
CALCIUM: 7.7 mg/dL — AB (ref 8.4–10.5)
CO2: 24 mEq/L (ref 19–32)
Chloride: 94 mEq/L — ABNORMAL LOW (ref 96–112)
Creatinine, Ser: 6.78 mg/dL — ABNORMAL HIGH (ref 0.50–1.35)
GFR calc Af Amer: 8 mL/min — ABNORMAL LOW (ref >90)
GFR, EST NON AFRICAN AMERICAN: 7 mL/min — AB (ref >90)
Glucose, Bld: 113 mg/dL — ABNORMAL HIGH (ref 70–99)
Potassium: 4.4 mEq/L (ref 3.7–5.3)
Sodium: 140 mEq/L (ref 137–147)

## 2014-02-01 LAB — CBC
HCT: 25.7 % — ABNORMAL LOW (ref 39.0–52.0)
Hemoglobin: 8.4 g/dL — ABNORMAL LOW (ref 13.0–17.0)
MCH: 28.1 pg (ref 26.0–34.0)
MCHC: 32.7 g/dL (ref 30.0–36.0)
MCV: 86 fL (ref 78.0–100.0)
PLATELETS: 305 10*3/uL (ref 150–400)
RBC: 2.99 MIL/uL — ABNORMAL LOW (ref 4.22–5.81)
RDW: 15.8 % — ABNORMAL HIGH (ref 11.5–15.5)
WBC: 12.3 10*3/uL — ABNORMAL HIGH (ref 4.0–10.5)

## 2014-02-01 MED ORDER — ONDANSETRON HCL 4 MG/2ML IJ SOLN
4.0000 mg | Freq: Four times a day (QID) | INTRAMUSCULAR | Status: DC | PRN
  Administered 2014-02-01 – 2014-02-04 (×3): 4 mg via INTRAVENOUS
  Filled 2014-02-01 (×3): qty 2

## 2014-02-01 MED ORDER — DARBEPOETIN ALFA-POLYSORBATE 100 MCG/0.5ML IJ SOLN
100.0000 ug | INTRAMUSCULAR | Status: DC
  Administered 2014-02-01: 100 ug via SUBCUTANEOUS
  Filled 2014-02-01 (×2): qty 0.5

## 2014-02-01 MED ORDER — ENSURE PUDDING PO PUDG
1.0000 | Freq: Two times a day (BID) | ORAL | Status: DC
Start: 2014-02-01 — End: 2014-02-04
  Administered 2014-02-02 – 2014-02-04 (×3): 1 via ORAL

## 2014-02-01 NOTE — Progress Notes (Signed)
Assessment  73 year old male with past medical history of hypertension diabetes who presented to the emergency room on 5/27 with complaints of nausea and vomiting x2 days. Found to be in severe acute renal failure with a creatinine of 9.36, potassium greater than 7.7 and hypotensive. Patient noted to be hypoxic, likely from aspiration pneumonia from vomiting with volume overload and was started on BiPAP. He was admitted to the critical care service. Patient was started on pressor support, CVVHD and was intubated.CRRT stopped 6/2 and transitioned to IHD.  He is currently making urine. Dialysis catheter accidentally removed by patient on 6/11 early morning. Catheter replaced on 6/14 by interventional radiology. Once it is determined whether patient will need dialysis long-term, he will go to skilled nursing facility. UNCLEAR at this time if will be ESRD or not.  1. AKI 2/2 ATN related to NSAID, ACEi, hypovolemia, PNA, Septic shock: CRRT 5/28-6/2, hx of stage 3 CKD.  Too early to declare ESRD.  Today's labs reflect HD yesterday... 2. Anemia - s/p PRBC's with HD 6/14; check iron studies; add darbe 3. Hyperkalemia - resolved with HD yesterday 4. HTN 5. DM 6. S/p PNA 7. Diastolic CHF   Subjective: Interval History: None Says "please no treatment today"  Objective: Vital signs in last 24 hours: Temp:  [97.6 F (36.4 C)-98.6 F (37 C)] 98.4 F (36.9 C) (06/15 0843) Pulse Rate:  [72-94] 85 (06/15 0843) Resp:  [14-30] 22 (06/15 0843) BP: (143-173)/(79-99) 162/92 mmHg (06/15 0843) SpO2:  [93 %-98 %] 93 % (06/14 2035) Weight:  [85 kg (187 lb 6.3 oz)] 85 kg (187 lb 6.3 oz) (06/15 0221) Weight change: 0.4 kg (14.1 oz)  Intake/Output from previous day: 06/14 0701 - 06/15 0700 In: 670 [Blood:670] Out: 0  Intake/Output this shift:   BP 162/92  Pulse 85  Temp(Src) 98.4 F (36.9 C) (Oral)  Resp 22  Ht 5\' 11"  (1.803 m)  Wt 85 kg (187 lb 6.3 oz)  BMI 26.15 kg/m2  SpO2 93% Disheveled  looking Lungs grossly clear TDC dressing intact No le edema  Lab Results:  Recent Labs  01/31/14 0530 02/01/14 0520  WBC 10.9* 12.3*  HGB 6.8* 8.4*  HCT 20.1* 25.7*  PLT 345 305   BMET:   Recent Labs  01/31/14 0530 02/01/14 0520  NA 140 140  K 5.2 4.4  CL 91* 94*  CO2 17* 24  GLUCOSE 98 113*  BUN 166* 83*  CREATININE 12.55* 6.78*  CALCIUM 7.4* 7.7*   Studies/Results: Ir Fluoro Guide Cv Line Right  01/31/2014   INDICATION: Renal failure and needs access for hemodialysis.  EXAM: FLUOROSCOPIC AND ULTRASOUND GUIDED PLACEMENT OF A TUNNELED DIALYSIS CATHETER  Physician: Stephan Minister. Anselm Pancoast, MD  FLUOROSCOPY TIME:  24 seconds  MEDICATIONS: 0.5 mg versed, 25 mcg fentanyl. A radiology nurse monitored the patient for moderate sedation.  ANESTHESIA/SEDATION: Moderate sedation time: 19 minutes  PROCEDURE: Informed consent was obtained for placement of a tunneled dialysis catheter. The patient was placed supine on the interventional table. Ultrasound confirmed a patent right internal jugularvein. Ultrasound images were obtained for documentation. The right side of the neck was prepped and draped in a sterile fashion. The right side of the neck was anesthetized with 1% lidocaine. Maximal barrier sterile technique was utilized including caps, mask, sterile gowns, sterile gloves, sterile drape, hand hygiene and skin antiseptic. A small incision was made with #11 blade scalpel. A 21 gauge needle directed into the right internal jugular vein with ultrasound guidance. A micropuncture dilator  set was placed. A 19 cm tip to cuff HemoSplit catheter was selected. The skin below the right clavicle was anesthetized and a small incision was made with an #11 blade scalpel. A subcutaneous tunnel was formed to the vein dermatotomy site. The catheter was brought through the tunnel. The vein dermatotomy site was dilated to accommodate a peel-away sheath. The catheter was placed through the peel-away sheath and directed  into the central venous structures. The tip of the catheter was placed in the lower SVC with fluoroscopy. Fluoroscopic images were obtained for documentation. Both lumens were found to aspirate and flush well. The proper amount of heparin was flushed in both lumens. The vein dermatotomy site was closed using a single layer of absorbable suture and Dermabond. The catheter was secured to the skin using Prolene suture.  FINDINGS: Catheter tip in the lower SVC.  COMPLICATIONS: None  IMPRESSION: Successful placement of a right jugular tunneled dialysis catheter using ultrasound and fluoroscopic guidance.   Electronically Signed   By: Markus Daft M.D.   On: 01/31/2014 13:45   Ir US Guide Vasc Access Right  01/31/2014   INDICATION: Renal failure and needs access for hemodialysis.  EXAM: FLUOROSCOPIC AND ULTRASOUND GUIDED PLACEMENT OF A TUNNELED DIALYSIS CATHETER  Physician: Stephan Minister. Anselm Pancoast, MD  FLUOROSCOPY TIME:  24 seconds  MEDICATIONS: 0.5 mg versed, 25 mcg fentanyl. A radiology nurse monitored the patient for moderate sedation.  ANESTHESIA/SEDATION: Moderate sedation time: 19 minutes  PROCEDURE: Informed consent was obtained for placement of a tunneled dialysis catheter. The patient was placed supine on the interventional table. Ultrasound confirmed a patent right internal jugularvein. Ultrasound images were obtained for documentation. The right side of the neck was prepped and draped in a sterile fashion. The right side of the neck was anesthetized with 1% lidocaine. Maximal barrier sterile technique was utilized including caps, mask, sterile gowns, sterile gloves, sterile drape, hand hygiene and skin antiseptic. A small incision was made with #11 blade scalpel. A 21 gauge needle directed into the right internal jugular vein with ultrasound guidance. A micropuncture dilator set was placed. A 19 cm tip to cuff HemoSplit catheter was selected. The skin below the right clavicle was anesthetized and a small incision was  made with an #11 blade scalpel. A subcutaneous tunnel was formed to the vein dermatotomy site. The catheter was brought through the tunnel. The vein dermatotomy site was dilated to accommodate a peel-away sheath. The catheter was placed through the peel-away sheath and directed into the central venous structures. The tip of the catheter was placed in the lower SVC with fluoroscopy. Fluoroscopic images were obtained for documentation. Both lumens were found to aspirate and flush well. The proper amount of heparin was flushed in both lumens. The vein dermatotomy site was closed using a single layer of absorbable suture and Dermabond. The catheter was secured to the skin using Prolene suture.  FINDINGS: Catheter tip in the lower SVC.  COMPLICATIONS: None  IMPRESSION: Successful placement of a right jugular tunneled dialysis catheter using ultrasound and fluoroscopic guidance.   Electronically Signed   By: Markus Daft M.D.   On: 01/31/2014 13:45    Scheduled: . antiseptic oral rinse  15 mL Mouth Rinse BID  . aspirin  81 mg Oral Daily  . carvedilol  3.125 mg Oral BID WC  . feeding supplement (ENSURE)  1 Container Oral BID BM  . insulin aspart  0-24 Units Subcutaneous TID WC  . pantoprazole  40 mg Oral QAC breakfast  LOS: 19 days   Mohamadou Maciver B 02/01/2014,5:15 PM

## 2014-02-01 NOTE — Progress Notes (Addendum)
NUTRITION FOLLOW UP  Intervention:   Add Ensure Pudding po BID, each supplement provides 170 kcal and 4 grams of protein. Change Nepro to prn. RD to continue to follow nutrition care plan.  Nutrition Dx:   Inadequate oral intake now related to swallowing difficulty as evidenced by limited intake. Ongoing.  Goal:   Intake to meet >90% of estimated nutrition needs. Met.  Monitor:   weight trends, lab trends, I/O's, PO intake, supplement tolerance  Assessment:   73 year old male with HTN and DM presented 5/27 to ED c/o N/V since 5/25. In ED found to be in acute renal failure, hyperkalemic, hypotensive with SBP's in 70s. Started on BiPAP for hypoxia and increased WOB. Required intubation on 5/28.  Patient was extubated on 6/1. CRRT off since 6/2. S/P multiple swallow evaluations with SLP; S/P MBS 6/4 - pt was found to not be ready for diet advancement at this time. Nepro enteral nutrition initiated via NGT on 6/4. Underwent additional MBSS on 6/9, and pt with Dysphagia 3 with Nectar Thickened Liquids. Eating less today, consuming <25% of meals today. SLP is considering repeat MBSS soon.  Renal continues to follow patient and assess need for HD daily.  CBG's: 106-131 Sodium and potassium WNL No recent phosphorus available  Height: Ht Readings from Last 1 Encounters:  01/14/14 5' 11"  (1.803 m)    Weight Status: Wt Readings from Last 1 Encounters:  02/01/14 187 lb 6.3 oz (85 kg)  78.7 kg s/p HD 6/8 79.5 kg s/p HD 6/6  Admission weight: 87.1 kg  Body mass index is 26.15 kg/(m^2). WNL  Re-estimated needs:  Kcal: 2000-2300 Protein: 95-110 gm Fluid: 1.2 L  Skin: stage I to R sacrum  Diet Order: Dysphagia 3; nectar thick liquids   Intake/Output Summary (Last 24 hours) at 02/01/14 1438 Last data filed at 01/31/14 2004  Gross per 24 hour  Intake    670 ml  Output      0 ml  Net    670 ml    Last BM: 6/13 - diarrhea   Labs:   Recent Labs Lab 01/30/14 0451  01/31/14 0530 02/01/14 0520  NA 141 140 140  K 5.0 5.2 4.4  CL 92* 91* 94*  CO2 20 17* 24  BUN 155* 166* 83*  CREATININE 12.04* 12.55* 6.78*  CALCIUM 7.6* 7.4* 7.7*  GLUCOSE 114* 98 113*    CBG (last 3)   Recent Labs  01/31/14 2150 02/01/14 0857 02/01/14 1138  GLUCAP 106* 131* 117*    Scheduled Meds: . antiseptic oral rinse  15 mL Mouth Rinse BID  . aspirin  81 mg Oral Daily  . carvedilol  3.125 mg Oral BID WC  . feeding supplement (NEPRO CARB STEADY)  237 mL Oral TID WC  . insulin aspart  0-24 Units Subcutaneous TID WC  . pantoprazole  40 mg Oral QAC breakfast    Continuous Infusions:    Inda Coke MS, RD, LDN Inpatient Registered Dietitian Pager: [phone] After-hours pager: [phone]

## 2014-02-01 NOTE — Progress Notes (Signed)
Physical Therapy Treatment Patient Details Name: [name] MRN: [id number] DOB: 03-03-1941 Today's Date: 02/01/2014    History of Present Illness 73 year old male with HTN and DM presented 5/27 to ED c/o N/V since 5/25. In ED found to be in acute renal failure, hyperkalemic, hypotensive with SBP's in 70s. Started on BiPAP for hypoxia and increased WOB. intubated 5/28 and extubated 6/1 Pt currently with Hemodialysis Catheter Left side of neck    PT Comments    Agreeable to getting OOB with much encouragement; Discussed pt with RN, who reports decr po intake today; Ambulating less distance over last two sessions -- will focus on progressive amb next session  Follow Up Recommendations  SNF;Supervision/Assistance - 24 hour     Equipment Recommendations  Rolling walker with 5" wheels;3in1 (PT)    Recommendations for Other Services       Precautions / Restrictions Precautions Precautions: Fall Restrictions Weight Bearing Restrictions: No    Mobility  Bed Mobility Overal bed mobility: Needs Assistance Bed Mobility: Rolling;Sidelying to Sit Rolling: Min assist Sidelying to sit: Min assist       General bed mobility comments: Verbal cues for technique.  Assist to raise trunk to sitting position.  Transfers Overall transfer level: Needs assistance Equipment used: Rolling walker (2 wheeled) Transfers: Sit to/from Stand Sit to Stand: Min assist;+2 safety/equipment         General transfer comment: Verbal cues for hand placement (attempting to pull up on RW).  Assist to rise to standing and for balance.  Ambulation/Gait Ambulation/Gait assistance: Min assist Ambulation Distance (Feet): 4 Feet Assistive device: Rolling walker (2 wheeled) Gait Pattern/deviations: Decreased step length - right;Decreased step length - left;Trunk flexed Gait velocity: slowed   General Gait Details: Verbal cues for safe use of RW.  Patient with flexed posture.  Cues to stand upright.  Patient  ambulated 3', and knees began buckling.  Moved to chair with assist.   Stairs            Wheelchair Mobility    Modified Rankin (Stroke Patients Only)       Balance     Sitting balance-Leahy Scale: Fair     Standing balance support: Bilateral upper extremity supported Standing balance-Leahy Scale: Poor                      Cognition Arousal/Alertness: Awake/alert Behavior During Therapy: WFL for tasks assessed/performed Overall Cognitive Status: Within Functional Limits for tasks assessed                      Exercises      General Comments        Pertinent Vitals/Pain no apparent distress     Home Living                      Prior Function            PT Goals (current goals can now be found in the care plan section) Acute Rehab PT Goals PT Goal Formulation: With patient Time For Goal Achievement: 02/04/14 Potential to Achieve Goals: Good Progress towards PT goals: Not progressing toward goals - comment (ambulating less distance over past 2 sessions)    Frequency  Min 3X/week    PT Plan Current plan remains appropriate    Co-evaluation             End of Session Equipment Utilized During Treatment: Gait belt Activity Tolerance: Patient  limited by fatigue Patient left: in chair;with call bell/phone within reach;with family/visitor present     Time: 7618-4859 PT Time Calculation (min): 15 min  Charges:  $Therapeutic Activity: 8-22 mins                    G Codes:      Quin Hoop 02/01/2014, 3:53 PM Roney [place], [place] Pager [phone] Office [phone]

## 2014-02-01 NOTE — Progress Notes (Signed)
[facility] PROGRESS NOTE  [name] [id number] DOB: 04/11/1941 DOA: 01/13/2014 PCP: No primary provider on file.  Interim Summary: Patient is a 73 year old male with past medical history hypertension diabetes who presented to the emergency room on 5/27 with complaints of nausea and vomiting x2 days. Number exam he was found to be in severe acute renal failure with a creatinine of 9.36, potassium greater than 7.7 and was hypotensive. Patient noted to be hypoxic, likely from aspiration pneumonia from vomiting with volume overload and was started on BiPAP. He was admitted to the critical care service. Nephrology was consulted. Patient was started on pressor support, CVVH and was intubated.  ARF felt to be multifactorial from hypovolemia, NSAID-induced nephropathy plus ACE inhibitor. Also high anion gap metabolic acidosis noted with metformin as contributing factor. Renal ultrasound notes medical renal disease. Chest x-ray noted pneumonia and/or pulmonary edema. Patient treated as septic shock with pressor support. Mildly elevated troponin plus questionable EKG changes, felt to have mild non-STEMI secondary to demand ischemia. Started on tube feedings.  Pt improved & extubated 6/1.  Able to be weaned off pressors and stress dose steroids. Antibiotics completed 6/3.  Patient continued on tube feedings as there was concerns about chronic aspiration due to to decreased mentation even after extubation. Patient failed initial swallow eval 6/3. CERT stopped 6/2 and hemodialysis started this patient's kidney function did not improve. Transfer to the hospitalist service on 6/5 on the floor.  Patient's mentation continued to improve. Speech therapy continue to follow and after modified barium swallow on 6/9, patient upgraded to dysphagia 3 diet. Patient intermittently dialyzed with hopes renal function will improve.  The patient's last dialysis was 6/8. He is currently making urine.  However, dialysis  catheter accidentally removed by patient on 6/11 early morning. Catheter replaced on 6/14 by interventional radiology. Once it is determined whether patient will need dialysis long-term, he will go to skilled nursing facility.  Assessment/Plan: Active Problems:   Hypotensive/Septic shock: Resolved. Patient met criteria given hypotension, end organ damage and required pressor support. Source felt to be pneumonia. Completed antibiotic course. Secondary to aspiration    Acute renal failure now in the setting of chronic renal disease. As mentioned above, patient making urine. Monitoring closely. Nephrology following. Creatinine today at 12.04. Hydrated with IV fluids. Catheter replaced 6/14 with dialysis following    Metabolic acidosis: Secondary to uremia. Initially from septic shock.    Hyperkalemia: Secondary renal failure. Initially treated with dialysis. Manage potassium with dialysis or emergency Kayexalate if needed    Thrombocytopenia: Resolved. Secondary to thrombocytopenia critical illness. Improve.    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 Hemoglobin dropped to 6.8 on 6/13 and no change in 24 hours. For 2 units packed red blood cells during dialysis, up to 8.4    Aspiration pneumonia: As above. Currently on restricted diet. Completed antibiotic course.    Diabetes mellitus: CBG stable. On sliding scale only. A1c checked and found to be on the lower side at 5.5. At this point would discontinue metformin permanently given renal failure and try to manage with dietary control only  Dysphagia: In part due to mentation. Currently at dysphagia 3 with nectar thick liquids.  Speech therapy will continue to follow, even at skilled nursing facility. Patient has history of presbyesophagus.  HTN: Slightly elevated. Started on beta blocker 6/13 Hyperlipidemia  Diastolic heart failure: Echocardiogram done in the setting of ?Non-STEMI.  Grade 2 diastolic dysfunction noted. Once renal  function assessed, we'll manage  it with dialysis versus diuretics. Low dose Coreg initiated.  Patient's weight at 192 on admission and as high as 205 down to 172 on 6/8. Weight has been trending up since. Likely will come down after dialysis today. Monitor closely  NSTEMI: Demand ischemia in the setting of septic shock. Resolved.  Toe ulceration: Patient was scheduled by orthopedic surgery to have planned left fourth toe amputation done 6/25. Hospitalist left message for need for postponement  Code Status: Full code Family Communication: Attempted to call family, no answering machine, no one picked up  Disposition Plan: For skilled nursing facility once renal disposition made   Consultants:  Nephrology  Critical Care  Interventional radiology  Procedures:  Status post intubation on 5/28-extubated 6/1  Temporary dialysis catheter placed 5/28, patient removed it on 6/11  CVVH started 5/20  Echo 5/28 >> EF 65 to 45%, grade 2 diastolic dysfx, mod AS   Placement of dialysis catheter by interventional radiology on 6/14   Antibiotics: Zosyn 5/27 >>> 6/03  Vancomycin 5/27 >>> 6/02  HPI/Subjective: Patient doing okay. Tired. Less neck pain.  Objective: Filed Vitals:   02/01/14 0843  BP: 162/92  Pulse: 85  Temp: 98.4 F (36.9 C)  Resp: 22    Intake/Output Summary (Last 24 hours) at 02/01/14 1330 Last data filed at 01/31/14 2004  Gross per 24 hour  Intake    670 ml  Output      0 ml  Net    670 ml   Filed Weights   01/31/14 1630 01/31/14 2004 02/01/14 0221  Weight: 85 kg (187 lb 6.3 oz) 85 kg (187 lb 6.3 oz) 85 kg (187 lb 6.3 oz)    Exam:   General:  No shortness of breath. Fatigued.  Cardiovascular: Regular rate and rhythm, 3/6 holosystolic murmur, borderline tachycardia  Respiratory: Clear to auscultation bilaterally, poor inspiratory effort  Abdomen: Soft, nontender, nondistended, hypoactive bowel sounds  Musculoskeletal: Clubbing or cyanosis, 1+  pitting edema bilaterally   Data Reviewed: Basic Metabolic Panel:  Recent Labs Lab 01/28/14 0700 01/29/14 0640 01/30/14 0451 01/31/14 0530 02/01/14 0520  NA 142 142 141 140 140  K 5.5* 6.1* 5.0 5.2 4.4  CL 94* 92* 92* 91* 94*  CO2 22 19 20  17* 24  GLUCOSE 139* 100* 114* 98 113*  BUN 131* 152* 155* 166* 83*  CREATININE 10.26* 11.74* 12.04* 12.55* 6.78*  CALCIUM 9.0 8.5 7.6* 7.4* 7.7*   Liver Function Tests: No results found for this basename: AST, ALT, ALKPHOS, BILITOT, PROT, ALBUMIN,  in the last 168 hours No results found for this basename: LIPASE, AMYLASE,  in the last 168 hours No results found for this basename: AMMONIA,  in the last 168 hours CBC:  Recent Labs Lab 01/26/14 0445 01/28/14 0700 01/30/14 0451 01/31/14 0530 02/01/14 0520  WBC 14.3* 13.0* 11.4* 10.9* 12.3*  HGB 7.9* 7.7* 6.8* 6.8* 8.4*  HCT 24.6* 23.7* 20.7* 20.1* 25.7*  MCV 89.5 88.8 87.7 87.0 86.0  PLT 394 413* 362 345 305   Cardiac Enzymes: No results found for this basename: CKTOTAL, CKMB, CKMBINDEX, TROPONINI,  in the last 168 hours BNP (last 3 results)  Recent Labs  01/13/14 2125  PROBNP 35530.0*   CBG:  Recent Labs Lab 01/30/14 2058 01/31/14 0757 01/31/14 2150 02/01/14 0857 02/01/14 1138  GLUCAP 124* 89 106* 131* 117*    No results found for this or any previous visit (from the past 240 hour(s)).   Studies: Ir Fluoro Guide Cv Line Right  01/31/2014  INDICATION: Renal failure and needs access for hemodialysis.  EXAM: FLUOROSCOPIC AND ULTRASOUND GUIDED PLACEMENT OF A TUNNELED DIALYSIS CATHETER  Physician: Stephan Minister. Anselm Pancoast, MD  FLUOROSCOPY TIME:  24 seconds  MEDICATIONS: 0.5 mg versed, 25 mcg fentanyl. A radiology nurse monitored the patient for moderate sedation.  ANESTHESIA/SEDATION: Moderate sedation time: 19 minutes  PROCEDURE: Informed consent was obtained for placement of a tunneled dialysis catheter. The patient was placed supine on the interventional table. Ultrasound confirmed  a patent right internal jugularvein. Ultrasound images were obtained for documentation. The right side of the neck was prepped and draped in a sterile fashion. The right side of the neck was anesthetized with 1% lidocaine. Maximal barrier sterile technique was utilized including caps, mask, sterile gowns, sterile gloves, sterile drape, hand hygiene and skin antiseptic. A small incision was made with #11 blade scalpel. A 21 gauge needle directed into the right internal jugular vein with ultrasound guidance. A micropuncture dilator set was placed. A 19 cm tip to cuff HemoSplit catheter was selected. The skin below the right clavicle was anesthetized and a small incision was made with an #11 blade scalpel. A subcutaneous tunnel was formed to the vein dermatotomy site. The catheter was brought through the tunnel. The vein dermatotomy site was dilated to accommodate a peel-away sheath. The catheter was placed through the peel-away sheath and directed into the central venous structures. The tip of the catheter was placed in the lower SVC with fluoroscopy. Fluoroscopic images were obtained for documentation. Both lumens were found to aspirate and flush well. The proper amount of heparin was flushed in both lumens. The vein dermatotomy site was closed using a single layer of absorbable suture and Dermabond. The catheter was secured to the skin using Prolene suture.  FINDINGS: Catheter tip in the lower SVC.  COMPLICATIONS: None  IMPRESSION: Successful placement of a right jugular tunneled dialysis catheter using ultrasound and fluoroscopic guidance.   Electronically Signed   By: Markus Daft M.D.   On: 01/31/2014 13:45   Ir US Guide Vasc Access Right  01/31/2014   INDICATION: Renal failure and needs access for hemodialysis.  EXAM: FLUOROSCOPIC AND ULTRASOUND GUIDED PLACEMENT OF A TUNNELED DIALYSIS CATHETER  Physician: Stephan Minister. Anselm Pancoast, MD  FLUOROSCOPY TIME:  24 seconds  MEDICATIONS: 0.5 mg versed, 25 mcg fentanyl. A radiology  nurse monitored the patient for moderate sedation.  ANESTHESIA/SEDATION: Moderate sedation time: 19 minutes  PROCEDURE: Informed consent was obtained for placement of a tunneled dialysis catheter. The patient was placed supine on the interventional table. Ultrasound confirmed a patent right internal jugularvein. Ultrasound images were obtained for documentation. The right side of the neck was prepped and draped in a sterile fashion. The right side of the neck was anesthetized with 1% lidocaine. Maximal barrier sterile technique was utilized including caps, mask, sterile gowns, sterile gloves, sterile drape, hand hygiene and skin antiseptic. A small incision was made with #11 blade scalpel. A 21 gauge needle directed into the right internal jugular vein with ultrasound guidance. A micropuncture dilator set was placed. A 19 cm tip to cuff HemoSplit catheter was selected. The skin below the right clavicle was anesthetized and a small incision was made with an #11 blade scalpel. A subcutaneous tunnel was formed to the vein dermatotomy site. The catheter was brought through the tunnel. The vein dermatotomy site was dilated to accommodate a peel-away sheath. The catheter was placed through the peel-away sheath and directed into the central venous structures. The tip of the catheter was  placed in the lower SVC with fluoroscopy. Fluoroscopic images were obtained for documentation. Both lumens were found to aspirate and flush well. The proper amount of heparin was flushed in both lumens. The vein dermatotomy site was closed using a single layer of absorbable suture and Dermabond. The catheter was secured to the skin using Prolene suture.  FINDINGS: Catheter tip in the lower SVC.  COMPLICATIONS: None  IMPRESSION: Successful placement of a right jugular tunneled dialysis catheter using ultrasound and fluoroscopic guidance.   Electronically Signed   By: Markus Daft M.D.   On: 01/31/2014 13:45    Scheduled Meds: . antiseptic  oral rinse  15 mL Mouth Rinse BID  . aspirin  81 mg Oral Daily  . carvedilol  3.125 mg Oral BID WC  . feeding supplement (NEPRO CARB STEADY)  237 mL Oral TID WC  . insulin aspart  0-24 Units Subcutaneous TID WC  . pantoprazole  40 mg Oral QAC breakfast   Continuous Infusions:    Principal Problem:   Acute renal failure Active Problems:   Severe sepsis   Metabolic acidosis   Hyperkalemia   Thrombocytopenia   Anemia   Aspiration pneumonia   Diabetes mellitus   Dysphagia   HTN (hypertension)   Other and unspecified hyperlipidemia   Overweight   Aortic stenosis, moderate   Chronic diastolic heart failure   NSTEMI (non-ST elevated myocardial infarction)   Toe ulcer    Time spent: 15 minutes    [facility] Pager [phone]. If 7PM-7AM, please contact night-coverage at [email or web address], password [facility] 02/01/2014, 1:30 PM  LOS: 19 days

## 2014-02-01 NOTE — Progress Notes (Signed)
Speech Language Pathology Treatment: Dysphagia  Patient Details Name: ALEXSIS BRANSCOM MRN: [id number] DOB: 22-May-1941 Today's Date: 02/01/2014 Time: 3300-7622 SLP Time Calculation (min): 10 min  Assessment / Plan / Recommendation Clinical Impression  Pt reports nausea and dry heaving. Vocal quality is soft, pt appears weak. When observed with thin liquids and nectar thick water pt with timely swallow, possibly stronger than during last assessment. Had hoped to retest pt if he was doing well, but given his reports of weakness and nausea will not request MBS today. Will plan to f/u again and consider repeat MBS for possible upgrade to thin liquids, given risk history of silent aspiration. Pt verbalized understanding.    HPI HPI: 73 year old male with HTN and DM presented 5/27 to ED c/o N/V since 5/25. In ED found to be in acute renal failure, hyperkalemic, hypotensive with SBP's in 70s. Started on BiPAP for hypoxia and increased WOB. Intubated from 5/28 to 6/1. Pt with ICU delirium. Admission Chest CXR says New confluent bibasilar pulmonary opacity, nonspecific but consider bilateral pneumonia or aspiration. Pt has undergone multiple MBS studies with yesterday's study showing improved swallow function to allow po intake with strict precautions.     Pertinent Vitals NA  SLP Plan  Continue with current plan of care    Recommendations Diet recommendations: Dysphagia 3 (mechanical soft);Nectar-thick liquid Liquids provided via: Cup;Straw Medication Administration: Whole meds with puree Supervision: Patient able to self feed;Full supervision/cueing for compensatory strategies Compensations: Slow rate;Small sips/bites;Clear throat intermittently;Follow solids with liquid Postural Changes and/or Swallow Maneuvers: Seated upright 90 degrees;Upright 30-60 min after meal;Out of bed for meals              Oral Care Recommendations: Oral care BID Follow up Recommendations: Inpatient Rehab Plan: Continue  with current plan of care    GO    [facility], [place] CCC-SLP [phone]  Lynann Beaver 02/01/2014, 11:16 AM

## 2014-02-02 ENCOUNTER — Inpatient Hospital Stay (HOSPITAL_COMMUNITY): Payer: Medicare Other

## 2014-02-02 ENCOUNTER — Other Ambulatory Visit: Payer: Self-pay

## 2014-02-02 DIAGNOSIS — I4892 Unspecified atrial flutter: Secondary | ICD-10-CM

## 2014-02-02 HISTORY — DX: Unspecified atrial flutter: I48.92

## 2014-02-02 LAB — CBC
HCT: 27.9 % — ABNORMAL LOW (ref 39.0–52.0)
HEMOGLOBIN: 9 g/dL — AB (ref 13.0–17.0)
MCH: 28.4 pg (ref 26.0–34.0)
MCHC: 32.3 g/dL (ref 30.0–36.0)
MCV: 88 fL (ref 78.0–100.0)
PLATELETS: 302 10*3/uL (ref 150–400)
RBC: 3.17 MIL/uL — ABNORMAL LOW (ref 4.22–5.81)
RDW: 15.8 % — ABNORMAL HIGH (ref 11.5–15.5)
WBC: 10.5 10*3/uL (ref 4.0–10.5)

## 2014-02-02 LAB — GLUCOSE, CAPILLARY
GLUCOSE-CAPILLARY: 147 mg/dL — AB (ref 70–99)
Glucose-Capillary: 99 mg/dL (ref 70–99)
Glucose-Capillary: 98 mg/dL (ref 70–99)

## 2014-02-02 LAB — RENAL FUNCTION PANEL
Albumin: 2.2 g/dL — ABNORMAL LOW (ref 3.5–5.2)
BUN: 103 mg/dL — ABNORMAL HIGH (ref 6–23)
CALCIUM: 7.6 mg/dL — AB (ref 8.4–10.5)
CO2: 25 mEq/L (ref 19–32)
Chloride: 95 mEq/L — ABNORMAL LOW (ref 96–112)
Creatinine, Ser: 8.22 mg/dL — ABNORMAL HIGH (ref 0.50–1.35)
GFR, EST AFRICAN AMERICAN: 7 mL/min — AB (ref >90)
GFR, EST NON AFRICAN AMERICAN: 6 mL/min — AB (ref >90)
Glucose, Bld: 97 mg/dL (ref 70–99)
Phosphorus: 11.7 mg/dL — ABNORMAL HIGH (ref 2.3–4.6)
Potassium: 4.5 mEq/L (ref 3.7–5.3)
SODIUM: 142 meq/L (ref 137–147)

## 2014-02-02 LAB — FERRITIN: FERRITIN: 2065 ng/mL — AB (ref 22–322)

## 2014-02-02 LAB — IRON AND TIBC
IRON: 29 ug/dL — AB (ref 42–135)
Saturation Ratios: 15 % — ABNORMAL LOW (ref 20–55)
TIBC: 193 ug/dL — AB (ref 215–435)
UIBC: 164 ug/dL (ref 125–400)

## 2014-02-02 MED ORDER — METOPROLOL TARTRATE 1 MG/ML IV SOLN
2.5000 mg | Freq: Four times a day (QID) | INTRAVENOUS | Status: DC | PRN
  Administered 2014-02-02: 2.5 mg via INTRAVENOUS

## 2014-02-02 MED ORDER — METOPROLOL TARTRATE 1 MG/ML IV SOLN
INTRAVENOUS | Status: AC
  Filled 2014-02-02: qty 5

## 2014-02-02 MED ORDER — METOPROLOL TARTRATE 1 MG/ML IV SOLN
2.5000 mg | INTRAVENOUS | Status: DC | PRN
  Administered 2014-02-02: 2.5 mg via INTRAVENOUS
  Filled 2014-02-02: qty 5

## 2014-02-02 NOTE — Procedures (Signed)
I have personally attended this patient's dialysis session.   Pt had onset of aflutter with rates as high as 150. O2 applied, BFR turned down to 250 No chest pain, but + SOB and "feels bad" K 4.5 (2K bath) Primary service aware - pt given iv lopressor Currently back in sinus rhythm Further eval per primary svce   DUNHAM,CYNTHIA B

## 2014-02-02 NOTE — Progress Notes (Signed)
[facility] PROGRESS NOTE  ODILON CASS OMV:[id number] DOB: 04/16/1941 DOA: 01/13/2014 PCP: No primary provider on file.  Interim Summary: Patient is a 73 year old male with past medical history hypertension diabetes who presented to the emergency room on 5/27 with complaints of nausea and vomiting x2 days. Number exam he was found to be in severe acute renal failure with a creatinine of 9.36, potassium greater than 7.7 and was hypotensive. Patient noted to be hypoxic, likely from aspiration pneumonia from vomiting with volume overload and was started on BiPAP. He was admitted to the critical care service. Nephrology was consulted. Patient was started on pressor support, CVVH and was intubated.  ARF felt to be multifactorial from hypovolemia, NSAID-induced nephropathy plus ACE inhibitor. Also high anion gap metabolic acidosis noted with metformin as contributing factor. Renal ultrasound notes medical renal disease. Chest x-ray noted pneumonia and/or pulmonary edema. Patient treated as septic shock with pressor support. Mildly elevated troponin plus questionable EKG changes, felt to have mild non-STEMI secondary to demand ischemia. Started on tube feedings.  Pt improved & extubated 6/1.  Able to be weaned off pressors and stress dose steroids. Antibiotics completed 6/3.  Patient continued on tube feedings as there was concerns about chronic aspiration due to to decreased mentation even after extubation. Patient failed initial swallow eval 6/3. CERT stopped 6/2 and hemodialysis started this patient's kidney function did not improve. Transfer to the hospitalist service on 6/5 on the floor.  Patient's mentation continued to improve. Speech therapy continue to follow and after modified barium swallow on 6/9, patient upgraded to dysphagia 3 diet with nectar thick liquids. Patient intermittently dialyzed with hopes renal function will improve.  The patient's last dialysis was 6/8. He is currently making  urine.  However, dialysis catheter accidentally removed by patient on 6/11 early morning. Catheter replaced on 6/14 by interventional radiology. Patient was followed by speech therapy on 6/16 who upgraded him to a dysphagia 3 diet with thin liquids  On 6/16, Patient started developing episodes of atrial flutter during dialysis. This is being treated with IV Lopressor.   Once it is determined whether patient will need dialysis long-term, he will go to skilled nursing facility.  Assessment/Plan: Active Problems:   Hypotensive/Septic shock: Resolved. Patient met criteria given hypotension, end organ damage and required pressor support. Source felt to be pneumonia. Completed antibiotic course. Secondary to aspiration    Acute renal failure now in the setting of chronic renal disease. As mentioned above, patient making urine. Monitoring closely. Nephrology following. Creatinine today at 12.04. Hydrated with IV fluids. Catheter replaced 6/14 with dialysis following    Metabolic acidosis: Secondary to uremia. Initially from septic shock.    Hyperkalemia: Secondary renal failure. Initially treated with dialysis. Manage potassium with dialysis or emergency Kayexalate if needed    Thrombocytopenia: Resolved. Secondary to thrombocytopenia critical illness. Improve.    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 Hemoglobin dropped to 6.8 on 6/13 and no change in 24 hours. For 2 units packed red blood cells during dialysis, up to 8.4    Aspiration pneumonia: As above. Currently on restricted diet. Completed antibiotic course.   Atrial flutter: On 6/16, Patient started developing episodes of significant tachycardia during dialysis. Rhythm assessment found to be aflutter.  Treating with IV Lopressor.    Diabetes mellitus: CBG stable. On sliding scale only. A1c checked and found to be on the lower side at 5.5. At this point would discontinue metformin permanently given renal failure and try to  manage with dietary control only  Dysphagia: In part due to mentation. Initially at dysphagia 3 with nectar thick liquids.  Upgraded by speech therapy to than likely this on 6/16. Patient has history of presbyesophagus.  Speech therapy should follow even at skilled nursing facility  HTN: Slightly elevated. Started on beta blocker 6/13 Hyperlipidemia  Diastolic heart failure: Echocardiogram done in the setting of ?Non-STEMI.  Grade 2 diastolic dysfunction noted. Once renal function assessed, we'll manage it with dialysis versus diuretics. Low dose Coreg initiated.  Patient's weight at 192 on admission and as high as 205 down to 172 on 6/8. Weight has been trending up since. Likely will come down after dialysis today. Monitor closely  NSTEMI: Demand ischemia in the setting of septic shock. Resolved.  Toe ulceration: Patient was scheduled by orthopedic surgery to have planned left fourth toe amputation done 6/25. Hospitalist left message for need for postponement  Code Status: Full code Family Communication: Attempted to call family, no answering machine, no one picked up  Disposition Plan: For skilled nursing facility once renal disposition made   Consultants:  Nephrology  Critical Care  Interventional radiology  Speech therapy    Procedures:  Status post intubation on 5/28-extubated 6/1  Temporary dialysis catheter placed 5/28, patient removed it on 6/11  CVVH started 5/20  Echo 5/28 >> EF 65 to 33%, grade 2 diastolic dysfx, mod AS   Placement of dialysis catheter by interventional radiology on 6/14   Antibiotics: Zosyn 5/27 >>> 6/03  Vancomycin 5/27 >>> 6/02  HPI/Subjective: Doing okay. Hungry. No chest pain or shortness of breath  Objective: Filed Vitals:   02/02/14 0936  BP: 157/88  Pulse: 81  Temp: 97.4 F (36.3 C)  Resp: 20    Intake/Output Summary (Last 24 hours) at 02/02/14 1138 Last data filed at 02/02/14 0520  Gross per 24 hour  Intake      0  ml  Output    650 ml  Net   -650 ml   Filed Weights   01/31/14 2004 02/01/14 0221 02/01/14 2042  Weight: 85 kg (187 lb 6.3 oz) 85 kg (187 lb 6.3 oz) 85.001 kg (187 lb 6.3 oz)    Exam:   General:  Alert and oriented x2, no acute distress  Cardiovascular: Regular rate and rhythm, 3/6 holosystolic murmur, borderline tachycardia  Respiratory: Clear to auscultation bilaterally, poor inspiratory effort  Abdomen: Soft, nontender, nondistended, hypoactive bowel sounds  Musculoskeletal: Clubbing or cyanosis, 1+ pitting edema bilaterally   Data Reviewed: Basic Metabolic Panel:  Recent Labs Lab 01/29/14 0640 01/30/14 0451 01/31/14 0530 02/01/14 0520 02/02/14 0507  NA 142 141 140 140 142  K 6.1* 5.0 5.2 4.4 4.5  CL 92* 92* 91* 94* 95*  CO2 19 20 17* 24 25  GLUCOSE 100* 114* 98 113* 97  BUN 152* 155* 166* 83* 103*  CREATININE 11.74* 12.04* 12.55* 6.78* 8.22*  CALCIUM 8.5 7.6* 7.4* 7.7* 7.6*  PHOS  --   --   --   --  11.7*   Liver Function Tests:  Recent Labs Lab 02/02/14 0507  ALBUMIN 2.2*   No results found for this basename: LIPASE, AMYLASE,  in the last 168 hours No results found for this basename: AMMONIA,  in the last 168 hours CBC:  Recent Labs Lab 01/28/14 0700 01/30/14 0451 01/31/14 0530 02/01/14 0520 02/02/14 0507  WBC 13.0* 11.4* 10.9* 12.3* 10.5  HGB 7.7* 6.8* 6.8* 8.4* 9.0*  HCT 23.7* 20.7* 20.1* 25.7* 27.9*  MCV 88.8  87.7 87.0 86.0 88.0  PLT 413* 362 345 305 302   Cardiac Enzymes: No results found for this basename: CKTOTAL, CKMB, CKMBINDEX, TROPONINI,  in the last 168 hours BNP (last 3 results)  Recent Labs  01/13/14 2125  PROBNP 35530.0*   CBG:  Recent Labs Lab 02/01/14 1634 02/01/14 1700 02/01/14 2047 02/02/14 0732 02/02/14 1115  GLUCAP 120* 116* 117* 99 147*    No results found for this or any previous visit (from the past 240 hour(s)).   Studies: Dg Swallowing Func-speech Pathology  02/02/2014   Katherene Ponto [place],  CCC-SLP     02/02/2014 10:53 AM Objective Swallowing Evaluation: Modified Barium Swallowing Study   Patient Details  Name: [name] MRN: [id number] Date of Birth: 06-May-1941  Today's Date: 02/02/2014 Time: 1000-1030 SLP Time Calculation (min): 30 min  Past Medical History:  Past Medical History  Diagnosis Date  . Diabetes mellitus without complication   . Hypertension   . Hypercholesteremia    Past Surgical History:  Past Surgical History  Procedure Laterality Date  . Prostatectomy    . Colon resection     HPI:  73 year old male with HTN and DM presented 5/27 to ED c/o N/V  since 5/25. In ED found to be in acute renal failure,  hyperkalemic, hypotensive with SBP's in 70s. Started on BiPAP for  hypoxia and increased WOB. Intubated from 5/28 to 6/1. Pt with  ICU delirium. Admission Chest CXR says New confluent bibasilar  pulmonary opacity, nonspecific but consider bilateral pneumonia  or aspiration. Pt has undergone multiple MBS studies with  yesterday's study showing improved swallow function to allow po  intake with strict precautions.       Assessment / Plan / Recommendation Clinical Impression  Dysphagia Diagnosis: Moderate pharyngeal phase  dysphagia;Moderate cervical esophageal phase dysphagia Clinical impression: Pt demonstrates swallow function that is  likely his baseline. Strength has continued to improve with  minimal oropharygneal residuals even with solid textures. 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 Pt  consistently has trace silent penetration to the cords with small  to normal sips of thin liquids. He had aspiration when trying to  swallow too quickly or tilting his head dramatically back. If pt  clears his throat every 2-3 sips he can expel trace penetrates.  Again this function appears to be pts baseline. Recommend a dys 3  (mechanical soft) diet with thin liquids and an intermittent  throat clear with  ongoing therapy to address compensatory  strategies.     Treatment Recommendation  Therapy as outlined in treatment plan below    Diet Recommendation Dysphagia 3 (Mechanical Soft);Thin liquid   Liquid Administration via: Cup;No straw Medication Administration: Whole meds with puree Supervision: Patient able to self feed;Full supervision/cueing  for compensatory strategies Compensations: Slow rate;Small sips/bites;Clear throat  intermittently;Follow solids with liquid Postural Changes and/or Swallow Maneuvers: Seated upright 90  degrees;Upright 30-60 min after meal;Out of bed for meals    Other  Recommendations Oral Care Recommendations: Oral care BID   Follow Up Recommendations  Inpatient Rehab    Frequency and Duration min 2x/week  2 weeks   Pertinent Vitals/Pain NA    SLP Swallow Goals     General HPI: 73 year old male with HTN and DM presented 5/27 to  ED c/o N/V since 5/25. In ED found to be in acute renal failure,  hyperkalemic, hypotensive with SBP's in 70s. Started on  BiPAP for  hypoxia and increased WOB. Intubated from 5/28 to 6/1. Pt with  ICU delirium. Admission Chest CXR says New confluent bibasilar  pulmonary opacity, nonspecific but consider bilateral pneumonia  or aspiration. Pt has undergone multiple MBS studies with  yesterday's study showing improved swallow function to allow po  intake with strict precautions.   Type of Study: Modified Barium Swallowing Study Reason for Referral: Objectively evaluate swallowing function Diet Prior to this Study: Dysphagia 3 (soft);Nectar-thick liquids Temperature Spikes Noted: No Respiratory Status: Room air History of Recent Intubation: Yes Length of Intubations (days): 5 days Date extubated: 01/18/14 Behavior/Cognition: Alert;Cooperative;Pleasant mood Oral Cavity - Dentition: Dentures, top;Dentures, bottom Oral Motor / Sensory Function: Within functional limits Self-Feeding Abilities: Able to feed self Patient Positioning: Upright in chair Baseline Vocal Quality:  Clear Volitional Cough: Strong Volitional Swallow: Able to elicit Anatomy: Other (Comment) (bony protrustion at C5/6, Prominent CP,  no radiologist pres) Pharyngeal Secretions: Not observed secondary MBS    Reason for Referral Objectively evaluate swallowing function   Oral Phase Oral Preparation/Oral Phase Oral Phase: WFL Oral - Honey Oral - Honey Teaspoon: Not tested Oral - Nectar Oral - Nectar Teaspoon: Not tested Oral - Nectar Cup: Not tested Oral - Thin Oral - Thin Teaspoon: Not tested Oral - Thin Straw: Within functional limits (with a chin tuck) Oral - Solids Oral - Puree: Within functional limits   Pharyngeal Phase Pharyngeal Phase Pharyngeal Phase: Impaired Pharyngeal - Honey Pharyngeal - Honey Teaspoon: Not tested Pharyngeal - Nectar Pharyngeal - Nectar Teaspoon: Not tested Pharyngeal - Nectar Cup: Not tested Pharyngeal - Nectar Straw: Not tested Pharyngeal - Thin Pharyngeal - Thin Teaspoon: Not tested Pharyngeal - Thin Cup: Delayed swallow initiation;Reduced  laryngeal elevation;Reduced airway/laryngeal  closure;Penetration/Aspiration before swallow;Trace aspiration Penetration/Aspiration details (thin cup): Material enters  airway, CONTACTS cords and not ejected out;Material enters  airway, passes BELOW cords without attempt by patient to eject  out (silent aspiration) Pharyngeal - Thin Straw: Delayed swallow initiation;Reduced  laryngeal elevation;Reduced airway/laryngeal  closure;Penetration/Aspiration before swallow;Trace aspiration Penetration/Aspiration details (thin straw): Material enters  airway, CONTACTS cords and not ejected out Pharyngeal - Solids Pharyngeal - Puree: Pharyngeal residue - valleculae;Reduced  laryngeal elevation;Reduced epiglottic inversion Penetration/Aspiration details (puree): Material does not enter  airway Pharyngeal - Regular: Pharyngeal residue - valleculae;Reduced  laryngeal elevation;Reduced epiglottic inversion  Cervical Esophageal Phase    GO    Cervical Esophageal  Phase Cervical Esophageal Phase: Impaired Cervical Esophageal Phase - Comment Cervical Esophageal Comment: see impression statement        Herbie Baltimore, MA CCC-SLP [phone]  DeBlois, Katherene Ponto 02/02/2014, 10:51 AM     Scheduled Meds: . antiseptic oral rinse  15 mL Mouth Rinse BID  . aspirin  81 mg Oral Daily  . carvedilol  3.125 mg Oral BID WC  . darbepoetin (ARANESP) injection - NON-DIALYSIS  100 mcg Subcutaneous Q Mon-1800  . feeding supplement (ENSURE)  1 Container Oral BID BM  . insulin aspart  0-24 Units Subcutaneous TID WC  . pantoprazole  40 mg Oral QAC breakfast   Continuous Infusions:    Principal Problem:   Acute renal failure Active Problems:   Severe sepsis   Metabolic acidosis   Hyperkalemia   Thrombocytopenia   Anemia   Aspiration pneumonia   Diabetes mellitus   Dysphagia   HTN (hypertension)   Other and unspecified hyperlipidemia   Overweight   Aortic stenosis, moderate   Chronic diastolic heart failure   NSTEMI (non-ST elevated myocardial infarction)  Toe ulcer    Time spent: 25 minutes    [facility] Pager [phone]. If 7PM-7AM, please contact night-coverage at [email or web address], password [facility] 02/02/2014, 11:38 AM  LOS: 20 days

## 2014-02-02 NOTE — Progress Notes (Signed)
Occupational Therapy Treatment Patient Details Name: [name] MRN: [id number] DOB: Dec 20, 1940 Today's Date: 02/02/2014    History of present illness 73 year old male with HTN and DM presented 5/27 to ED c/o N/V since 5/25. In ED found to be in acute renal failure, hyperkalemic, hypotensive with SBP's in 70s. Started on BiPAP for hypoxia and increased WOB. intubated 5/28 and extubated 6/1 Pt currently with Hemodialysis Catheter Left side of neck   OT comments  Pt agreeable to sitting EOB for ADL.  Tolerated x 20 minutes with supervision while grooming.  Razor of minimal effectiveness.  Attempted to call wife to have her bring his Copy without success, RN aware.  Follow Up Recommendations  SNF    Equipment Recommendations       Recommendations for Other Services      Precautions / Restrictions Precautions Precautions: Fall       Mobility Bed Mobility Overal bed mobility: Needs Assistance Bed Mobility: Supine to Sit;Sit to Supine     Supine to sit: Min assist;HOB elevated Sit to supine: Min assist   General bed mobility comments: assist for LEs, required extra time  Transfers                      Balance   Sitting-balance support: Feet supported Sitting balance-Leahy Scale: Good                             ADL Overall ADL's : Needs assistance/impaired Eating/Feeding: Set up;Bed level   Grooming: Wash/dry face;Brushing hair;Sitting;Minimal assistance (shaving) Grooming Details (indicate cue type and reason): sat EOB x 20 minutes with supervision                                General ADL Comments: Pt very willing to work with OT at EOB. Fatigues easily, but shaved, washed face and combed hair.       Vision                     Perception     Praxis      Cognition   Behavior During Therapy: WFL for tasks assessed/performed Overall Cognitive Status: No family/caregiver present to determine baseline  cognitive functioning Area of Impairment: Memory     Memory: Decreased short-term memory        Problem Solving: Slow processing General Comments: Pt unable to recall phone numbers to call his wife.    Extremity/Trunk Assessment               Exercises     Shoulder Instructions       General Comments      Pertinent Vitals/ Pain       C/o pain "all over", did not rate, repositioned in bed  Home Living                                          Prior Functioning/Environment              Frequency Min 2X/week     Progress Toward Goals  OT Goals(current goals can now be found in the care plan section)  Progress towards OT goals: Progressing toward goals  Acute Rehab OT Goals Patient Stated Goal: not stated  Plan Discharge  plan remains appropriate    Co-evaluation                 End of Session     Activity Tolerance Patient limited by fatigue   Patient Left in bed;with call bell/phone within reach   Nurse Communication Other (comment) (tried to call wife for electric razor, reminded of no straws)        Time: 9476-5465 OT Time Calculation (min): 34 min  Charges: OT General Charges $OT Visit: 1 Procedure OT Treatments $Self Care/Home Management : 23-37 mins  Malka So 02/02/2014, 3:15 PM [phone]

## 2014-02-02 NOTE — Procedures (Signed)
Objective Swallowing Evaluation: Modified Barium Swallowing Study  Patient Details  Name: [name] MRN: [id number] Date of Birth: [birth year]  Today's Date: 02/02/2014 Time: 1000-1030 SLP Time Calculation (min): 30 min  Past Medical History:  Past Medical History  Diagnosis Date  . Diabetes mellitus without complication   . Hypertension   . Hypercholesteremia    Past Surgical History:  Past Surgical History  Procedure Laterality Date  . Prostatectomy    . Colon resection     HPI:  73 year old male with HTN and DM presented 5/27 to ED c/o N/V since 5/25. In ED found to be in acute renal failure, hyperkalemic, hypotensive with SBP's in 70s. Started on BiPAP for hypoxia and increased WOB. Intubated from 5/28 to 6/1. Pt with ICU delirium. Admission Chest CXR says New confluent bibasilar pulmonary opacity, nonspecific but consider bilateral pneumonia or aspiration. Pt has undergone multiple MBS studies with yesterday's study showing improved swallow function to allow po intake with strict precautions.       Assessment / Plan / Recommendation Clinical Impression  Dysphagia Diagnosis: Moderate pharyngeal phase dysphagia;Moderate cervical esophageal phase dysphagia Clinical impression: Pt demonstrates swallow function that is likely his baseline. Strength has continued to improve with minimal oropharygneal residuals even with solid textures. 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 Pt consistently has trace silent penetration to the cords with small to normal sips of thin liquids. He had aspiration when trying to swallow too quickly or tilting his head dramatically back. If pt clears his throat every 2-3 sips he can expel trace penetrates. Again this function appears to be pts baseline. Recommend a dys 3 (mechanical soft) diet with thin liquids and an intermittent throat clear with ongoing therapy to  address compensatory strategies.     Treatment Recommendation  Therapy as outlined in treatment plan below    Diet Recommendation Dysphagia 3 (Mechanical Soft);Thin liquid   Liquid Administration via: Cup;No straw Medication Administration: Whole meds with puree Supervision: Patient able to self feed;Full supervision/cueing for compensatory strategies Compensations: Slow rate;Small sips/bites;Clear throat intermittently;Follow solids with liquid Postural Changes and/or Swallow Maneuvers: Seated upright 90 degrees;Upright 30-60 min after meal;Out of bed for meals    Other  Recommendations Oral Care Recommendations: Oral care BID   Follow Up Recommendations  Inpatient Rehab    Frequency and Duration min 2x/week  2 weeks   Pertinent Vitals/Pain NA    SLP Swallow Goals     General HPI: 73 year old male with HTN and DM presented 5/27 to ED c/o N/V since 5/25. In ED found to be in acute renal failure, hyperkalemic, hypotensive with SBP's in 70s. Started on BiPAP for hypoxia and increased WOB. Intubated from 5/28 to 6/1. Pt with ICU delirium. Admission Chest CXR says New confluent bibasilar pulmonary opacity, nonspecific but consider bilateral pneumonia or aspiration. Pt has undergone multiple MBS studies with yesterday's study showing improved swallow function to allow po intake with strict precautions.   Type of Study: Modified Barium Swallowing Study Reason for Referral: Objectively evaluate swallowing function Diet Prior to this Study: Dysphagia 3 (soft);Nectar-thick liquids Temperature Spikes Noted: No Respiratory Status: Room air History of Recent Intubation: Yes Length of Intubations (days): 5 days Date extubated: 01/18/14 Behavior/Cognition: Alert;Cooperative;Pleasant mood Oral Cavity - Dentition: Dentures, top;Dentures, bottom Oral Motor / Sensory Function: Within functional limits Self-Feeding Abilities: Able to feed self Patient Positioning: Upright in chair Baseline  Vocal Quality: Clear Volitional Cough: Strong  Volitional Swallow: Able to elicit Anatomy: Other (Comment) (bony protrustion at C5/6, Prominent CP, no radiologist pres) Pharyngeal Secretions: Not observed secondary MBS    Reason for Referral Objectively evaluate swallowing function   Oral Phase Oral Preparation/Oral Phase Oral Phase: WFL Oral - Honey Oral - Honey Teaspoon: Not tested Oral - Nectar Oral - Nectar Teaspoon: Not tested Oral - Nectar Cup: Not tested Oral - Thin Oral - Thin Teaspoon: Not tested Oral - Thin Straw: Within functional limits (with a chin tuck) Oral - Solids Oral - Puree: Within functional limits   Pharyngeal Phase Pharyngeal Phase Pharyngeal Phase: Impaired Pharyngeal - Honey Pharyngeal - Honey Teaspoon: Not tested Pharyngeal - Nectar Pharyngeal - Nectar Teaspoon: Not tested Pharyngeal - Nectar Cup: Not tested Pharyngeal - Nectar Straw: Not tested Pharyngeal - Thin Pharyngeal - Thin Teaspoon: Not tested Pharyngeal - Thin Cup: Delayed swallow initiation;Reduced laryngeal elevation;Reduced airway/laryngeal closure;Penetration/Aspiration before swallow;Trace aspiration Penetration/Aspiration details (thin cup): Material enters airway, CONTACTS cords and not ejected out;Material enters airway, passes BELOW cords without attempt by patient to eject out (silent aspiration) Pharyngeal - Thin Straw: Delayed swallow initiation;Reduced laryngeal elevation;Reduced airway/laryngeal closure;Penetration/Aspiration before swallow;Trace aspiration Penetration/Aspiration details (thin straw): Material enters airway, CONTACTS cords and not ejected out Pharyngeal - Solids Pharyngeal - Puree: Pharyngeal residue - valleculae;Reduced laryngeal elevation;Reduced epiglottic inversion Penetration/Aspiration details (puree): Material does not enter airway Pharyngeal - Regular: Pharyngeal residue - valleculae;Reduced laryngeal elevation;Reduced epiglottic inversion  Cervical  Esophageal Phase    GO    Cervical Esophageal Phase Cervical Esophageal Phase: Impaired Cervical Esophageal Phase - Comment Cervical Esophageal Comment: see impression statement        Herbie Baltimore, MA CCC-SLP [phone]  Lynann Beaver 02/02/2014, 10:51 AM

## 2014-02-02 NOTE — Progress Notes (Signed)
Speech Language Pathology Treatment: Dysphagia  Patient Details Name: [name] MRN: [id number] DOB: April 12, 1941 Today's Date: 02/02/2014 Time: 3435-6861 SLP Time Calculation (min): 10 min  Assessment / Plan / Recommendation Clinical Impression  SLP provided trials of thin liquids with min verbal cues for effortful swallow with increased laryngeal elevation. Pt responded to cueing well, no overt evidence of aspiration. Pt denies nausea today and would really like to return to thin liquids if possible. Will plan for MBS later this am.    HPI HPI: 73 year old male with HTN and DM presented 5/27 to ED c/o N/V since 5/25. In ED found to be in acute renal failure, hyperkalemic, hypotensive with SBP's in 70s. Started on BiPAP for hypoxia and increased WOB. Intubated from 5/28 to 6/1. Pt with ICU delirium. Admission Chest CXR says New confluent bibasilar pulmonary opacity, nonspecific but consider bilateral pneumonia or aspiration. Pt has undergone multiple MBS studies with yesterday's study showing improved swallow function to allow po intake with strict precautions.     Pertinent Vitals NA  SLP Plan  MBS    Recommendations Diet recommendations: Dysphagia 3 (mechanical soft);Nectar-thick liquid              Plan: MBS    GO    Herbie Baltimore, MA CCC-SLP [phone]  Lynann Beaver 02/02/2014, 9:48 AM

## 2014-02-02 NOTE — Progress Notes (Signed)
Assessment  73 year old male with past medical history of hypertension diabetes who presented to the emergency room on 5/27 with complaints of nausea and vomiting x2 days. Found to be in severe acute renal failure with a creatinine of 9.36, potassium greater than 7.7 and hypotensive. Patient noted to be hypoxic, likely from aspiration pneumonia from vomiting with volume overload and was started on BiPAP. He was admitted to the critical care service. Patient was started on pressor support, CVVHD and was intubated.CRRT stopped 6/2 and transitioned to IHD.  He is currently making urine. Dialysis catheter accidentally removed by patient on 6/11 early morning. Catheter replaced on 6/14 by interventional radiology. Once it is determined whether patient will need dialysis long-term, he will go to skilled nursing facility. UNCLEAR at this time if will be ESRD or not (baseline creatinine not known - was 1.29-1.49 back in 2009).  1. AKI 2/2 ATN related to NSAID, ACEi, hypovolemia, PNA, Septic shock: CRRT 5/28-6/2, hx of stage 3 CKD.  Too early to declare ESRD.  Creatinine 6.7->8.2 today so despite some UOP, no return of GFR.  Dialysis today.   2. Anemia - s/p PRBC's with HD 6/14; tsat low but ferritin of 2000 precludes use of Fe with HD; added darbe 100/week (6/15) 3. Hyperkalemia - resolved 4. HTN 5. DM 6. S/p PNA 7. Diastolic CHF   Subjective: Interval History: None  Objective: Vital signs in last 24 hours: Temp:  [97.9 F (36.6 C)-98.5 F (36.9 C)] 97.9 F (36.6 C) (06/16 0504) Pulse Rate:  [76-85] 76 (06/16 0504) Resp:  [20-22] 20 (06/16 0504) BP: (147-168)/(80-92) 168/90 mmHg (06/16 0504) SpO2:  [94 %-96 %] 95 % (06/16 0504) Weight:  [85.001 kg (187 lb 6.3 oz)] 85.001 kg (187 lb 6.3 oz) (06/15 2042) Weight change: 0.001 kg (0 oz)  Intake/Output from previous day: 06/15 0701 - 06/16 0700 In: 0  Out: 650 [Urine:650] Intake/Output this shift: Total I/O In: 0  Out: 650 [Urine:650] BP  168/90  Pulse 76  Temp(Src) 97.9 F (36.6 C) (Oral)  Resp 20  Ht 5\' 11"  (1.803 m)  Wt 85.001 kg (187 lb 6.3 oz)  BMI 26.15 kg/m2  SpO2 95%  Physical examination VS as noted Disheveled looking WM   Depressed app Lungs grossly clear TDC dressing intact No edema of the LE's  Lab Results:  Recent Labs  02/01/14 0520 02/02/14 0507  WBC 12.3* 10.5  HGB 8.4* 9.0*  HCT 25.7* 27.9*  PLT 305 302   BMET:   Recent Labs  02/01/14 0520 02/02/14 0507  NA 140 142  K 4.4 4.5  CL 94* 95*  CO2 24 25  GLUCOSE 113* 97  BUN 83* 103*  CREATININE 6.78* 8.22*  CALCIUM 7.7* 7.6*  Results for [name] (MRN [id number]) as of 02/02/2014 12:43  Ref. Range 02/02/2014 05:07  Iron Latest Range: 42-135 ug/dL 29 (L)  UIBC Latest Range: 125-400 ug/dL 164  TIBC Latest Range: 215-435 ug/dL 193 (L)  Saturation Ratios Latest Range: 20-55 % 15 (L)  Ferritin Latest Range: 22-322 ng/mL 2065 (H)   Studies/Results: Ir Fluoro Guide Cv Line Right  01/31/2014   INDICATION: Renal failure and needs access for hemodialysis.  EXAM: FLUOROSCOPIC AND ULTRASOUND GUIDED PLACEMENT OF A TUNNELED DIALYSIS CATHETER  Physician: [name]. Timothy Pancoast, MD  FLUOROSCOPY TIME:  24 seconds  MEDICATIONS: 0.5 mg versed, 25 mcg fentanyl. A radiology nurse monitored the patient for moderate sedation.  ANESTHESIA/SEDATION: Moderate sedation time: 19 minutes  PROCEDURE: Informed consent was obtained  for placement of a tunneled dialysis catheter. The patient was placed supine on the interventional table. Ultrasound confirmed a patent right internal jugularvein. Ultrasound images were obtained for documentation. The right side of the neck was prepped and draped in a sterile fashion. The right side of the neck was anesthetized with 1% lidocaine. Maximal barrier sterile technique was utilized including caps, mask, sterile gowns, sterile gloves, sterile drape, hand hygiene and skin antiseptic. A small incision was made with #11 blade scalpel. A 21  gauge needle directed into the right internal jugular vein with ultrasound guidance. A micropuncture dilator set was placed. A 19 cm tip to cuff HemoSplit catheter was selected. The skin below the right clavicle was anesthetized and a small incision was made with an #11 blade scalpel. A subcutaneous tunnel was formed to the vein dermatotomy site. The catheter was brought through the tunnel. The vein dermatotomy site was dilated to accommodate a peel-away sheath. The catheter was placed through the peel-away sheath and directed into the central venous structures. The tip of the catheter was placed in the lower SVC with fluoroscopy. Fluoroscopic images were obtained for documentation. Both lumens were found to aspirate and flush well. The proper amount of heparin was flushed in both lumens. The vein dermatotomy site was closed using a single layer of absorbable suture and Dermabond. The catheter was secured to the skin using Prolene suture.  FINDINGS: Catheter tip in the lower SVC.  COMPLICATIONS: None  IMPRESSION: Successful placement of a right jugular tunneled dialysis catheter using ultrasound and fluoroscopic guidance.   Electronically Signed   By: [name] M.D.   On: 01/31/2014 13:45   Ir US Guide Vasc Access Right  01/31/2014   INDICATION: Renal failure and needs access for hemodialysis.  EXAM: FLUOROSCOPIC AND ULTRASOUND GUIDED PLACEMENT OF A TUNNELED DIALYSIS CATHETER  Physician: [name]. Timothy Pancoast, MD  FLUOROSCOPY TIME:  24 seconds  MEDICATIONS: 0.5 mg versed, 25 mcg fentanyl. A radiology nurse monitored the patient for moderate sedation.  ANESTHESIA/SEDATION: Moderate sedation time: 19 minutes  PROCEDURE: Informed consent was obtained for placement of a tunneled dialysis catheter. The patient was placed supine on the interventional table. Ultrasound confirmed a patent right internal jugularvein. Ultrasound images were obtained for documentation. The right side of the neck was prepped and draped in a sterile  fashion. The right side of the neck was anesthetized with 1% lidocaine. Maximal barrier sterile technique was utilized including caps, mask, sterile gowns, sterile gloves, sterile drape, hand hygiene and skin antiseptic. A small incision was made with #11 blade scalpel. A 21 gauge needle directed into the right internal jugular vein with ultrasound guidance. A micropuncture dilator set was placed. A 19 cm tip to cuff HemoSplit catheter was selected. The skin below the right clavicle was anesthetized and a small incision was made with an #11 blade scalpel. A subcutaneous tunnel was formed to the vein dermatotomy site. The catheter was brought through the tunnel. The vein dermatotomy site was dilated to accommodate a peel-away sheath. The catheter was placed through the peel-away sheath and directed into the central venous structures. The tip of the catheter was placed in the lower SVC with fluoroscopy. Fluoroscopic images were obtained for documentation. Both lumens were found to aspirate and flush well. The proper amount of heparin was flushed in both lumens. The vein dermatotomy site was closed using a single layer of absorbable suture and Dermabond. The catheter was secured to the skin using Prolene suture.  FINDINGS: Catheter tip in  the lower SVC.  COMPLICATIONS: None  IMPRESSION: Successful placement of a right jugular tunneled dialysis catheter using ultrasound and fluoroscopic guidance.   Electronically Signed   By: [name] M.D.   On: 01/31/2014 13:45    Scheduled: . antiseptic oral rinse  15 mL Mouth Rinse BID  . aspirin  81 mg Oral Daily  . carvedilol  3.125 mg Oral BID WC  . darbepoetin (ARANESP) injection - NON-DIALYSIS  100 mcg Subcutaneous Q Mon-1800  . feeding supplement (ENSURE)  1 Container Oral BID BM  . insulin aspart  0-24 Units Subcutaneous TID WC  . pantoprazole  40 mg Oral QAC breakfast       LOS: 20 days   DUNHAM,CYNTHIA B 02/02/2014,6:58 AM

## 2014-02-03 LAB — RENAL FUNCTION PANEL
Albumin: 2.2 g/dL — ABNORMAL LOW (ref 3.5–5.2)
BUN: 51 mg/dL — AB (ref 6–23)
CALCIUM: 8.1 mg/dL — AB (ref 8.4–10.5)
CO2: 25 mEq/L (ref 19–32)
CREATININE: 4.8 mg/dL — AB (ref 0.50–1.35)
Chloride: 95 mEq/L — ABNORMAL LOW (ref 96–112)
GFR calc Af Amer: 13 mL/min — ABNORMAL LOW (ref >90)
GFR calc non Af Amer: 11 mL/min — ABNORMAL LOW (ref >90)
GLUCOSE: 184 mg/dL — AB (ref 70–99)
PHOSPHORUS: 7.5 mg/dL — AB (ref 2.3–4.6)
Potassium: 4.1 mEq/L (ref 3.7–5.3)
Sodium: 138 mEq/L (ref 137–147)

## 2014-02-03 LAB — CBC
HEMATOCRIT: 28.5 % — AB (ref 39.0–52.0)
Hemoglobin: 9.1 g/dL — ABNORMAL LOW (ref 13.0–17.0)
MCH: 28.3 pg (ref 26.0–34.0)
MCHC: 31.9 g/dL (ref 30.0–36.0)
MCV: 88.8 fL (ref 78.0–100.0)
Platelets: 235 10*3/uL (ref 150–400)
RBC: 3.21 MIL/uL — ABNORMAL LOW (ref 4.22–5.81)
RDW: 15.3 % (ref 11.5–15.5)
WBC: 8.5 10*3/uL (ref 4.0–10.5)

## 2014-02-03 LAB — GLUCOSE, CAPILLARY
Glucose-Capillary: 96 mg/dL (ref 70–99)
Glucose-Capillary: 166 mg/dL — ABNORMAL HIGH (ref 70–99)
Glucose-Capillary: 101 mg/dL — ABNORMAL HIGH (ref 70–99)
Glucose-Capillary: 159 mg/dL — ABNORMAL HIGH (ref 70–99)

## 2014-02-03 NOTE — Progress Notes (Signed)
Assessment  73 year old male with past medical history of hypertension diabetes who presented to the emergency room on 5/27 with complaints of nausea and vomiting x2 days. Found to be in severe acute renal failure with a creatinine of 9.36, potassium greater than 7.7 and hypotensive. Patient noted to be hypoxic, likely from aspiration pneumonia from vomiting with volume overload and was started on BiPAP. He was admitted to the critical care service. Patient was started on pressor support, CVVHD and was intubated.CRRT stopped 6/2 and transitioned to IHD.  He is currently making urine. Dialysis catheter accidentally removed by patient on 6/11 early morning. Catheter replaced on 6/14 by interventional radiology. Once it is determined whether patient will need dialysis long-term, he will go to skilled nursing facility. UNCLEAR at this time if will be ESRD or not (baseline creatinine not known - was 1.29-1.49 back in 2009).  1. AKI 2/2 ATN related to NSAID, ACEi, hypovolemia, PNA, Septic shock, CRRT 5/28-6/2, now getting prn HD. hx of stage 3 CKD.  Too early to declare ESRD.  Creatinine 6.7->8.2 yesterday so had HD (despite some UOP, no return of GFR).  Check labs in AM and decide about tomorrow. 2. Anemia - s/p PRBC's with HD 6/14; tsat low but ferritin of 2000 precludes use of Fe with HD; added darbe 100/week (6/15) 3. Hyperkalemia - resolved 4. HTN 5. DM 6. S/p PNA (aspiration- completed ATB's 7. Diastolic CHF 8. Atrial flutter - occurred on dialysis 6/16; has not had this issue prior. Responded to IV lopressor.  9. S/p NTEMI - demand ischemia in setting of septic shock on adm  Subjective: Interval History: Generally still just "feels bad" Atrial flutter on HD yesterday - pt did not recognize any new symptoms associated with this other than some SOB (no palpitations) but was still SOB when went back in NSR  Objective: Vital signs in last 24 hours: Temp:  [97.9 F (36.6 C)-98.5 F (36.9 C)] 98 F  (36.7 C) (06/17 1000) Pulse Rate:  [77-138] 82 (06/17 1000) Resp:  [11-22] 18 (06/17 0443) BP: (105-150)/(63-91) 138/82 mmHg (06/17 1000) SpO2:  [94 %-99 %] 94 % (06/17 1000) Weight:  [80.4 kg (177 lb 4 oz)-82.7 kg (182 lb 5.1 oz)] 80.4 kg (177 lb 4 oz) (06/16 1941) Weight change: -2.301 kg (-5 lb 1.2 oz)  Intake/Output from previous day: 06/16 0701 - 06/17 0700 In: 600 [P.O.:600] Out: 2500 [Urine:500] Intake/Output this shift: Total I/O In: 120 [P.O.:120] Out: -  BP 138/82  Pulse 82  Temp(Src) 98 F (36.7 C) (Oral)  Resp 18  Ht 5\' 11"  (1.803 m)  Wt 80.4 kg (177 lb 4 oz)  BMI 24.73 kg/m2  SpO2 94%  Physical examination VS as noted Depressed appearing WM Lungs grossly clear TDC dressing intact No edema of the LE's  Lab Results:  Recent Labs  02/02/14 0507 02/03/14 1135  WBC 10.5 8.5  HGB 9.0* 9.1*  HCT 27.9* 28.5*  PLT 302 235   BMET:   Recent Labs  02/02/14 0507 02/03/14 1135  NA 142 138  K 4.5 4.1  CL 95* 95*  CO2 25 25  GLUCOSE 97 184*  BUN 103* 51*  CREATININE 8.22* 4.80*  CALCIUM 7.6* 8.1*   Results for ERICA, OSUNA (MRN [id number]) as of 02/02/2014 12:43  Ref. Range 02/02/2014 05:07  Iron Latest Range: 42-135 ug/dL 29 (L)  UIBC Latest Range: 125-400 ug/dL 164  TIBC Latest Range: 215-435 ug/dL 193 (L)  Saturation Ratios Latest Range: 20-55 % 15 (L)  Ferritin Latest Range: 22-322 ng/mL 2065 (H)   Studies/Results: Dg Swallowing Func-speech Pathology  02/02/2014   Katherene Ponto [place], CCC-SLP     02/02/2014 10:53 AM Objective Swallowing Evaluation: Modified Barium Swallowing Study   Patient Details  Name: PARIS CHIRIBOGA MRN: [id number] Date of Birth: 1941-06-08  Today's Date: 02/02/2014 Time: 1000-1030 SLP Time Calculation (min): 30 min  Past Medical History:  Past Medical History  Diagnosis Date  . Diabetes mellitus without complication   . Hypertension   . Hypercholesteremia    Past Surgical History:  Past Surgical History  Procedure Laterality Date   . Prostatectomy    . Colon resection     HPI:  73 year old male with HTN and DM presented 5/27 to ED c/o N/V  since 5/25. In ED found to be in acute renal failure,  hyperkalemic, hypotensive with SBP's in 70s. Started on BiPAP for  hypoxia and increased WOB. Intubated from 5/28 to 6/1. Pt with  ICU delirium. Admission Chest CXR says New confluent bibasilar  pulmonary opacity, nonspecific but consider bilateral pneumonia  or aspiration. Pt has undergone multiple MBS studies with  yesterday's study showing improved swallow function to allow po  intake with strict precautions.       Assessment / Plan / Recommendation Clinical Impression  Dysphagia Diagnosis: Moderate pharyngeal phase  dysphagia;Moderate cervical esophageal phase dysphagia Clinical impression: Pt demonstrates swallow function that is  likely his baseline. Strength has continued to improve with  minimal oropharygneal residuals even with solid textures. 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 Pt  consistently has trace silent penetration to the cords with small  to normal sips of thin liquids. He had aspiration when trying to  swallow too quickly or tilting his head dramatically back. If pt  clears his throat every 2-3 sips he can expel trace penetrates.  Again this function appears to be pts baseline. Recommend a dys 3  (mechanical soft) diet with thin liquids and an intermittent  throat clear with ongoing therapy to address compensatory  strategies.     Treatment Recommendation  Therapy as outlined in treatment plan below    Diet Recommendation Dysphagia 3 (Mechanical Soft);Thin liquid   Liquid Administration via: Cup;No straw Medication Administration: Whole meds with puree Supervision: Patient able to self feed;Full supervision/cueing  for compensatory strategies Compensations: Slow rate;Small sips/bites;Clear throat  intermittently;Follow solids with liquid  Postural Changes and/or Swallow Maneuvers: Seated upright 90  degrees;Upright 30-60 min after meal;Out of bed for meals    Other  Recommendations Oral Care Recommendations: Oral care BID   Follow Up Recommendations  Inpatient Rehab    Frequency and Duration min 2x/week  2 weeks   Pertinent Vitals/Pain NA    SLP Swallow Goals     General HPI: 73 year old male with HTN and DM presented 5/27 to  ED c/o N/V since 5/25. In ED found to be in acute renal failure,  hyperkalemic, hypotensive with SBP's in 70s. Started on BiPAP for  hypoxia and increased WOB. Intubated from 5/28 to 6/1. Pt with  ICU delirium. Admission Chest CXR says New confluent bibasilar  pulmonary opacity, nonspecific but consider bilateral pneumonia  or aspiration. Pt has undergone multiple MBS studies with  yesterday's study showing improved swallow function to allow po  intake with strict precautions.   Type of Study: Modified Barium Swallowing Study Reason for Referral: Objectively evaluate swallowing function Diet Prior to this Study:  Dysphagia 3 (soft);Nectar-thick liquids Temperature Spikes Noted: No Respiratory Status: Room air History of Recent Intubation: Yes Length of Intubations (days): 5 days Date extubated: 01/18/14 Behavior/Cognition: Alert;Cooperative;Pleasant mood Oral Cavity - Dentition: Dentures, top;Dentures, bottom Oral Motor / Sensory Function: Within functional limits Self-Feeding Abilities: Able to feed self Patient Positioning: Upright in chair Baseline Vocal Quality: Clear Volitional Cough: Strong Volitional Swallow: Able to elicit Anatomy: Other (Comment) (bony protrustion at C5/6, Prominent CP,  no radiologist pres) Pharyngeal Secretions: Not observed secondary MBS    Reason for Referral Objectively evaluate swallowing function   Oral Phase Oral Preparation/Oral Phase Oral Phase: WFL Oral - Honey Oral - Honey Teaspoon: Not tested Oral - Nectar Oral - Nectar Teaspoon: Not tested Oral - Nectar Cup: Not tested Oral - Thin Oral -  Thin Teaspoon: Not tested Oral - Thin Straw: Within functional limits (with a chin tuck) Oral - Solids Oral - Puree: Within functional limits   Pharyngeal Phase Pharyngeal Phase Pharyngeal Phase: Impaired Pharyngeal - Honey Pharyngeal - Honey Teaspoon: Not tested Pharyngeal - Nectar Pharyngeal - Nectar Teaspoon: Not tested Pharyngeal - Nectar Cup: Not tested Pharyngeal - Nectar Straw: Not tested Pharyngeal - Thin Pharyngeal - Thin Teaspoon: Not tested Pharyngeal - Thin Cup: Delayed swallow initiation;Reduced  laryngeal elevation;Reduced airway/laryngeal  closure;Penetration/Aspiration before swallow;Trace aspiration Penetration/Aspiration details (thin cup): Material enters  airway, CONTACTS cords and not ejected out;Material enters  airway, passes BELOW cords without attempt by patient to eject  out (silent aspiration) Pharyngeal - Thin Straw: Delayed swallow initiation;Reduced  laryngeal elevation;Reduced airway/laryngeal  closure;Penetration/Aspiration before swallow;Trace aspiration Penetration/Aspiration details (thin straw): Material enters  airway, CONTACTS cords and not ejected out Pharyngeal - Solids Pharyngeal - Puree: Pharyngeal residue - valleculae;Reduced  laryngeal elevation;Reduced epiglottic inversion Penetration/Aspiration details (puree): Material does not enter  airway Pharyngeal - Regular: Pharyngeal residue - valleculae;Reduced  laryngeal elevation;Reduced epiglottic inversion  Cervical Esophageal Phase    GO    Cervical Esophageal Phase Cervical Esophageal Phase: Impaired Cervical Esophageal Phase - Comment Cervical Esophageal Comment: see impression statement        Herbie Baltimore, MA CCC-SLP [phone]  DeBlois, Katherene Ponto 02/02/2014, 10:51 AM     Scheduled: . antiseptic oral rinse  15 mL Mouth Rinse BID  . aspirin  81 mg Oral Daily  . carvedilol  3.125 mg Oral BID WC  . darbepoetin (ARANESP) injection - NON-DIALYSIS  100 mcg Subcutaneous Q Mon-1800  . feeding supplement (ENSURE)  1  Container Oral BID BM  . insulin aspart  0-24 Units Subcutaneous TID WC  . pantoprazole  40 mg Oral QAC breakfast       LOS: 21 days   DUNHAM,CYNTHIA B 02/03/2014,2:11 PM

## 2014-02-03 NOTE — Plan of Care (Signed)
Problem: Acute Rehab PT Goals(only PT should resolve) Goal: Pt Will Ambulate Activity tolerance limited by hypotension with standing activity.  [name], [place]  Acute Rehabilitation Services Pager [phone] Office [phone]

## 2014-02-03 NOTE — Clinical Social Work Note (Signed)
CSW continues to monitor patient's progress. Call received from admission's director at [facility] to get update on patient. They will release bed they were holding and await call from [facility] when patient medically stable for discharge.  Daril Warga Givens, MSW, LCSW [phone]

## 2014-02-03 NOTE — Progress Notes (Signed)
Speech Language Pathology Treatment: Dysphagia  Patient Details Name: [name] MRN: [id number] DOB: Mar 09, 1941 Today's Date: 02/03/2014 Time: 3794-3276 SLP Time Calculation (min): 8 min  Assessment / Plan / Recommendation Clinical Impression  Pt. Has been followed by ST for dysphagia and underwent an MBS yesterday and was upgraded to thin liquids.  Pt. able to verbally state swallow precautions with min cues and demonstrated use during clinical observation with min verbal reminders of frequency of multiple swallow and throat clears.  He independently consumed small cup sips.  Continue Dys 3 diet texture and thin liquids at SNF where SLP can upgrade diet to regular when able.   HPI HPI: 73 year old male with HTN and DM presented 5/27 to ED c/o N/V since 5/25. In ED found to be in acute renal failure, hyperkalemic, hypotensive with SBP's in 70s. Started on BiPAP for hypoxia and increased WOB. Intubated from 5/28 to 6/1. Pt with ICU delirium. Admission Chest CXR says New confluent bibasilar pulmonary opacity, nonspecific but consider bilateral pneumonia or aspiration. Pt has undergone multiple MBS studies with yesterday's study showing improved swallow function to allow po intake with strict precautions.     Pertinent Vitals WDL  SLP Plan  All goals met;Discharge SLP treatment due to (comment)    Recommendations Diet recommendations: Dysphagia 3 (mechanical soft);Thin liquid Liquids provided via: Cup;No straw Medication Administration: Whole meds with puree Supervision: Patient able to self feed;Intermittent supervision to cue for compensatory strategies Compensations: Small sips/bites;Slow rate;Multiple dry swallows after each bite/sip;Clear throat intermittently Postural Changes and/or Swallow Maneuvers: Seated upright 90 degrees;Upright 30-60 min after meal;Out of bed for meals              Oral Care Recommendations: Oral care BID Follow up Recommendations: Skilled Nursing  facility Plan: All goals met;Discharge SLP treatment due to (comment)         [name].Ed [employer] [phone]  02/03/2014

## 2014-02-03 NOTE — Progress Notes (Signed)
Physical Therapy Treatment Note  Orthostatic BPs taken during session and are as follows:   02/03/14 0849 02/03/14 0855 02/03/14 0900  Vital Signs  Pulse Rate 87 88 ! 108  Pulse Rate Source Dinamap Dinamap Dinamap  BP ! 147/88 mmHg 131/84 mmHg 114/70 mmHg  BP Location Right arm Right arm Right arm  BP Method Automatic Automatic Automatic  Patient Position (if appropriate) Lying Sitting Standing     02/03/14 0903  Vital Signs  Pulse Rate 80  Pulse Rate Source Dinamap  BP ! 147/78 mmHg  BP Location Right arm  BP Method Automatic  Patient Position (if appropriate) (Reclined in recliner post upright activity)    Full treatment note to follow;  Thanks,  Roney Marion, [place]  Acute Rehabilitation Services Pager [phone] Office [phone]

## 2014-02-03 NOTE — Progress Notes (Signed)
Patient ID: [name]  male  EHU:[id number]    DOB: 03/06/1952    DOA: 01/13/2014  PCP: No primary provider on file.  Assessment/Plan: Principal Problem:  Hypotensive/Septic shock, aspiration pneumonia: Resolved. Patient met criteria given hypotension, end organ damage and required pressor support. Source felt to be pneumonia secondary to aspiration. Completed antibiotic course.  - Currently on restricted diet  Active problems Acute renal failure now in the setting of chronic renal disease. Improving, Nephrology following. Creatinine today at 4.8 Hydrated with IV fluids. Catheter replaced 6/14 with dialysis following   Metabolic acidosis: Secondary to uremia. Initially from septic shock.   Hyperkalemia: Secondary renal failure. Initially treated with dialysis. Manage potassium with dialysis or emergency Kayexalate if needed   Thrombocytopenia: Resolved. Secondary to thrombocytopenia critical illness. Improving   Anemia: Secondary to chronic renal disease and illness. Hb stable at 9.1  Atrial flutter: On 6/16, Patient started developing episodes of significant tachycardia during dialysis. Rhythm assessment found to be aflutter. Treating with IV Lopressor.   Diabetes mellitus:  - CBG stable. On sliding scale only. A1c 5.5.  - At this point, discontinue metformin permanently given renal failure and try to manage with dietary control only   Dysphagia: In part due to mentation. Initially at dysphagia 3 with nectar thick liquids. Upgraded by speech therapy to than likely this on 6/16. Patient has history of presbyesophagus. Speech therapy should follow even at skilled nursing facility   HTN: Slightly elevated. Started on beta blocker 6/13   Hyperlipidemia   Diastolic heart failure: Echocardiogram done in the setting of ?Non-STEMI. Grade 2 diastolic dysfunction noted. Once renal function assessed, we'll manage it with dialysis versus diuretics. Low dose Coreg initiated.   NSTEMI:  Demand ischemia in the setting of septic shock. Resolved.   Toe ulceration: Patient was scheduled by orthopedic surgery to have planned left fourth toe amputation done 6/25. Hospitalist left message for need for postponement   DVT Prophylaxis:  Code Status:  Family Communication: d/w patient's wife, [name] at the bedside  Disposition: Will need skilled nursing facility  Consultants:   nephrology Critical Care  Interventional radiology  Speech therapy   Procedures:  Status post intubation on 5/28-extubated 6/1  Temporary dialysis catheter placed 5/28, patient removed it on 6/11  CVVH started 5/20  Echo 5/28 >> EF 65 to 78%, grade 2 diastolic dysfx, mod AS  Placement of dialysis catheter by interventional radiology on 6/14      Antibiotics:  Zosyn 5/27 >>> 6/03  Vancomycin 5/27 >>> 6/02  Subjective: Feeling weak today, working with PT, somewhat orthostatic     Objective: Weight change: -2.301 kg (-5 lb 1.2 oz)  Intake/Output Summary (Last 24 hours) at 02/03/14 1326 Last data filed at 02/03/14 0900  Gross per 24 hour  Intake    480 ml  Output   2500 ml  Net  -2020 ml   Blood pressure 138/82, pulse 82, temperature 98 F (36.7 C), temperature source Oral, resp. rate 18, height _0  (1.803 m), weight 80.4 kg (177 lb 4 oz), SpO2 94.00%.  Physical Exam: General: Alert and awake, oriented, frail not in any acute distress. CVS: S1-S2 clear, 3/6 holosystolic murmur rubs or gallops Chest: clear to auscultation bilaterally, no wheezing, rales or rhonchi Abdomen: soft nontender, nondistended, normal bowel sounds  Extremities: no cyanosis, clubbing or edema noted bilaterally Neuro: Cranial nerves II-XII intact, no focal neurological deficits  Lab Results: Basic Metabolic Panel:  Recent Labs Lab 02/02/14 0507 02/03/14 1135  NA 142 138  K 4.5 4.1  CL 95* 95*  CO2 25 25  GLUCOSE 97 184*  BUN 103* 51*  CREATININE 8.22* 4.80*  CALCIUM 7.6* 8.1*  PHOS 11.7* 7.5*    Liver Function Tests:  Recent Labs Lab 02/02/14 0507 02/03/14 1135  ALBUMIN 2.2* 2.2*   No results found for this basename: LIPASE, AMYLASE,  in the last 168 hours No results found for this basename: AMMONIA,  in the last 168 hours CBC:  Recent Labs Lab 02/02/14 0507 02/03/14 1135  WBC 10.5 8.5  HGB 9.0* 9.1*  HCT 27.9* 28.5*  MCV 88.0 88.8  PLT 302 235   Cardiac Enzymes: No results found for this basename: CKTOTAL, CKMB, CKMBINDEX, TROPONINI,  in the last 168 hours BNP: No components found with this basename: POCBNP,  CBG:  Recent Labs Lab 02/02/14 0732 02/02/14 1115 02/02/14 1948 02/03/14 0828 02/03/14 1131  GLUCAP 99 147* 98 96 166*     Micro Results: No results found for this or any previous visit (from the past 240 hour(s)).  Studies/Results: Dg Abd 1 View  01/23/2014   CLINICAL DATA:  Feeding tube placement.  EXAM: ABDOMEN - 1 VIEW  COMPARISON:  01/21/2014  FINDINGS: Feeding tube tip is seen with tip in the fundus of the stomach. Some residual contrast is seen within the colon. No dilated bowel loops seen.  IMPRESSION: Feeding tube tip in fundus of stomach.   Electronically Signed   By: Earle Gell M.D.   On: 01/23/2014 18:57   Dg Esophagus  01/26/2014   CLINICAL DATA:  Dysphagia. Modified swallow earlier in the morning only clearing patient for nectar thickness.  EXAM: ESOPHOGRAM/BARIUM SWALLOW  TECHNIQUE: Single contrast examination was performed using  thick barium.  FLUOROSCOPY TIME:  3 min and 20 is seconds  COMPARISON:  Chest radiograph 01/19/2014.  FINDINGS: Focused single-contrast exam was performed in a supine, mildly LPO position. The bed was maintained partially upright.  Evaluation of a single swallow demonstrates contrast stasis throughout the mid and upper thoracic esophagus.  Full column evaluation of the esophagus demonstrates no persistent narrowing or stricture.  13 mm barium tablet has initial delay in the vallecula. On subsequent swallows,  the tablet passes into the esophagus and then into the stomach.  IMPRESSION: 1. Moderately degraded exam. 2. 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 3. No evidence of  stricture other anatomic cause for dysphagia. 4. Delayed passage of the barium tablet at the level of the vallecula.   Electronically Signed   By: Abigail Miyamoto M.D.   On: 01/26/2014 16:14   US Renal  01/14/2014   CLINICAL DATA:  Renal failure, history hypertension, diabetes  EXAM: RENAL/URINARY TRACT ULTRASOUND COMPLETE  COMPARISON:  None  FINDINGS: Right Kidney:  Length: 12.4 cm length. Cortical thickness up to 1.8 cm thick. Increased cortical echogenicity. Hypoechoic nodule medial aspect mid RIGHT kidney 2.1 x 2.2 x 2.1 cm containing scattered internal echoes question complicated cyst. No additional mass, hydronephrosis or shadowing calcification.  Left Kidney:  Length: 10.5 cm length. Cortical thickness up to 2.0 cm thick. Increased cortical echogenicity. Minimally complicated cyst at mid LEFT kidney 2.7 x 3.3 x 3.7 cm containing slightly irregular wall and scattered internal echoes. No additional mass, hydronephrosis or shadowing calcification.  Bladder:  Contains only minimal urine. Minimally enlarged central lobe of prostate.  IMPRESSION: Increased renal cortical echogenicity bilaterally consistent with medical renal disease.  Mildly complicated cysts within the mid portions of both kidneys.  No evidence  of hydronephrosis.   Electronically Signed   By: Lavonia Dana M.D.   On: 01/14/2014 02:31   Ir Fluoro Guide Cv Line Right  01/31/2014   INDICATION: Renal failure and needs access for hemodialysis.  EXAM: FLUOROSCOPIC AND ULTRASOUND GUIDED PLACEMENT OF A TUNNELED DIALYSIS CATHETER  Physician: Stephan Minister. Anselm Pancoast, MD  FLUOROSCOPY TIME:  24 seconds  MEDICATIONS: 0.5 mg versed, 25 mcg fentanyl. A radiology nurse monitored the patient for moderate sedation.  ANESTHESIA/SEDATION: Moderate sedation time: 19 minutes  PROCEDURE: Informed  consent was obtained for placement of a tunneled dialysis catheter. The patient was placed supine on the interventional table. Ultrasound confirmed a patent right internal jugularvein. Ultrasound images were obtained for documentation. The right side of the neck was prepped and draped in a sterile fashion. The right side of the neck was anesthetized with 1% lidocaine. Maximal barrier sterile technique was utilized including caps, mask, sterile gowns, sterile gloves, sterile drape, hand hygiene and skin antiseptic. A small incision was made with #11 blade scalpel. A 21 gauge needle directed into the right internal jugular vein with ultrasound guidance. A micropuncture dilator set was placed. A 19 cm tip to cuff HemoSplit catheter was selected. The skin below the right clavicle was anesthetized and a small incision was made with an #11 blade scalpel. A subcutaneous tunnel was formed to the vein dermatotomy site. The catheter was brought through the tunnel. The vein dermatotomy site was dilated to accommodate a peel-away sheath. The catheter was placed through the peel-away sheath and directed into the central venous structures. The tip of the catheter was placed in the lower SVC with fluoroscopy. Fluoroscopic images were obtained for documentation. Both lumens were found to aspirate and flush well. The proper amount of heparin was flushed in both lumens. The vein dermatotomy site was closed using a single layer of absorbable suture and Dermabond. The catheter was secured to the skin using Prolene suture.  FINDINGS: Catheter tip in the lower SVC.  COMPLICATIONS: None  IMPRESSION: Successful placement of a right jugular tunneled dialysis catheter using ultrasound and fluoroscopic guidance.   Electronically Signed   By: Markus Daft M.D.   On: 01/31/2014 13:45   Ir US Guide Vasc Access Right  01/31/2014   INDICATION: Renal failure and needs access for hemodialysis.  EXAM: FLUOROSCOPIC AND ULTRASOUND GUIDED PLACEMENT OF  A TUNNELED DIALYSIS CATHETER  Physician: Stephan Minister. Anselm Pancoast, MD  FLUOROSCOPY TIME:  24 seconds  MEDICATIONS: 0.5 mg versed, 25 mcg fentanyl. A radiology nurse monitored the patient for moderate sedation.  ANESTHESIA/SEDATION: Moderate sedation time: 19 minutes  PROCEDURE: Informed consent was obtained for placement of a tunneled dialysis catheter. The patient was placed supine on the interventional table. Ultrasound confirmed a patent right internal jugularvein. Ultrasound images were obtained for documentation. The right side of the neck was prepped and draped in a sterile fashion. The right side of the neck was anesthetized with 1% lidocaine. Maximal barrier sterile technique was utilized including caps, mask, sterile gowns, sterile gloves, sterile drape, hand hygiene and skin antiseptic. A small incision was made with #11 blade scalpel. A 21 gauge needle directed into the right internal jugular vein with ultrasound guidance. A micropuncture dilator set was placed. A 19 cm tip to cuff HemoSplit catheter was selected. The skin below the right clavicle was anesthetized and a small incision was made with an #11 blade scalpel. A subcutaneous tunnel was formed to the vein dermatotomy site. The catheter was brought through the tunnel. The vein  dermatotomy site was dilated to accommodate a peel-away sheath. The catheter was placed through the peel-away sheath and directed into the central venous structures. The tip of the catheter was placed in the lower SVC with fluoroscopy. Fluoroscopic images were obtained for documentation. Both lumens were found to aspirate and flush well. The proper amount of heparin was flushed in both lumens. The vein dermatotomy site was closed using a single layer of absorbable suture and Dermabond. The catheter was secured to the skin using Prolene suture.  FINDINGS: Catheter tip in the lower SVC.  COMPLICATIONS: None  IMPRESSION: Successful placement of a right jugular tunneled dialysis catheter  using ultrasound and fluoroscopic guidance.   Electronically Signed   By: Markus Daft M.D.   On: 01/31/2014 13:45   Dg Chest Port 1 View  01/19/2014   CLINICAL DATA:  Cough  EXAM: PORTABLE CHEST - 1 VIEW  COMPARISON:  01/18/2014  FINDINGS: Cardiac shadow is stable. A right jugular line and left jugular temporary dialysis catheter are again seen and stable. The endotracheal tube and nasogastric catheter have been removed. The lungs are well aerated bilaterally. Improved aeration is again seen bilaterally with persistent changes in the right lung base. No new focal infiltrate is seen.  IMPRESSION: Improved aeration with some persistent changes in the right base.  Tubes and lines as described.   Electronically Signed   By: Inez Catalina M.D.   On: 01/19/2014 06:53   Dg Chest Port 1 View  01/18/2014   CLINICAL DATA:  Check endotracheal tube position.  EXAM: PORTABLE CHEST - 1 VIEW  COMPARISON:  01/16/2014.  FINDINGS: Endotracheal tube terminates approximately 3.1 cm above the carina. Nasogastric tube is followed into the stomach. Right IJ central line tip projects over the low SVC. Left IJ catheter tip projects over the brachiocephalic vein confluence or upper SVC. Heart is enlarged, stable. Thoracic aorta is calcified. There is mild-to-moderate diffuse bilateral airspace disease with slight improvement in aeration at the right lung base. No definite pleural fluid.  IMPRESSION: Mild to moderate diffuse bilateral airspace disease, likely due to pulmonary edema. Slight improvement in aeration at the right lung base.   Electronically Signed   By: Lorin Picket M.D.   On: 01/18/2014 07:40   Dg Chest Port 1 View  01/16/2014   CLINICAL DATA:  Aspiration pneumonia  EXAM: PORTABLE CHEST - 1 VIEW  COMPARISON:  01/15/2014  FINDINGS: Endotracheal tube ends in the mid thoracic trachea. Unchanged positioning of bilateral IJ catheters, tips at the level of the SVC. Enteric tube enters the stomach.  Bibasilar airspace disease  shows no progression or clearing. No evidence of increasing pleural fluid or air leak.  IMPRESSION: 1. Stable positioning of tubes and lines. 2. Unchanged bibasilar pneumonia.   Electronically Signed   By: Jorje Guild M.D.   On: 01/16/2014 07:18   Dg Chest Port 1 View  01/15/2014   CLINICAL DATA:  Ventilator support.  Pneumonia.  EXAM: PORTABLE CHEST - 1 VIEW  COMPARISON:  01/14/2014  FINDINGS: Endotracheal tube has its tip 4 cm above the carina. Nasogastric tube enters the stomach. Right internal jugular central line has its tip in the SVC above the right atrium. Left internal jugular central line tips are in the SVC above the right atrium. Bilateral lower lobe airspace filling consistent with pneumonia persists, probably slightly improved since yesterday. The upper lungs remain clear. No effusions.  IMPRESSION: Lines and tubes well positioned. Slight improvement in bilateral lower lobe pneumonia.  Electronically Signed   By: Nelson Chimes M.D.   On: 01/15/2014 07:35   Dg Chest Port 1 View  01/14/2014   CLINICAL DATA:  Status post central line placement  EXAM: PORTABLE CHEST - 1 VIEW  COMPARISON:  01/14/2014 225 hr  FINDINGS: A left jugular dual-lumen central line is again identified and stable. A new right jugular central line is seen with the catheter tip at the cavoatrial junction. No pneumothorax is noted. The endotracheal tube is seen 5 cm above the carina. A nasogastric catheter is noted extending into the stomach. Bilateral lower lung infiltrates are seen. The overall appearance is stable. No new focal abnormality is noted.  IMPRESSION: Stable bilateral infiltrates.  New tubes and lines as described above without complicating factors.   Electronically Signed   By: Inez Catalina M.D.   On: 01/14/2014 09:33   Dg Chest Port 1 View  01/14/2014   CLINICAL DATA:  Hemodialysis catheter placement  EXAM: Portable exam 0225 hr compared to 01/13/2014  COMPARISON:  None.  FINDINGS: LEFT jugular dual-lumen  central venous catheter with tip projecting over mid SVC.  Enlargement of cardiac silhouette with pulmonary vascular congestion.  Perihilar and basilar infiltrates which could represent edema or infection.  No pneumothorax.  Bones unremarkable.  IMPRESSION: No pneumothorax following LEFT jugular line placement.  Enlargement of cardiac silhouette with pulmonary vascular congestion and diffuse increased pulmonary infiltrates, question edema versus infection.   Electronically Signed   By: Lavonia Dana M.D.   On: 01/14/2014 02:41   Dg Chest Port 1 View  01/13/2014   CLINICAL DATA:  73 year old male with back and knee pain. Shortness of Breath. Initial encounter.  EXAM: PORTABLE CHEST - 1 VIEW  COMPARISON:  11/21/2007.  FINDINGS: Portable AP supine view at 2211 hrs. New confluent bibasilar pulmonary opacity. No pneumothorax. Pulmonary vascularity within normal limits. Stable cardiac size and mediastinal contours. Visualized tracheal air column is within normal limits.  IMPRESSION: New confluent bibasilar pulmonary opacity, nonspecific but consider bilateral pneumonia or aspiration.   Electronically Signed   By: Lars Pinks M.D.   On: 01/13/2014 23:02   Dg Abd Portable 1v  01/23/2014   CLINICAL DATA:  Feeding tube placement.  EXAM: PORTABLE ABDOMEN - 1 VIEW  COMPARISON:  01/23/2014  FINDINGS: The Dobbhoff feeding tube is looped back on itself in the descending duodenum. The tip is in the antropyloric region of the stomach.  IMPRESSION: Dobbhoff feeding tube is looped back on itself in the descending duodenum.   Electronically Signed   By: Kalman Jewels M.D.   On: 01/23/2014 23:44   Dg Abd Portable 1v  01/21/2014   CLINICAL DATA:  Feeding tube placement.  EXAM: PORTABLE ABDOMEN - 1 VIEW  COMPARISON:  None.  FINDINGS: Weighted tip feeding tube is present in the proximal gastric fundus. Barium is present in small bowel. The bowel gas pattern is nonobstructive. Gaseous distention of the colon.  IMPRESSION: Weighted tip  feeding tube in the proximal gastric fundus.   Electronically Signed   By: Dereck Ligas M.D.   On: 01/21/2014 16:32   Dg Swallowing Func-speech Pathology  02/02/2014   Katherene Ponto Deblois, CCC-SLP     02/02/2014 10:53 AM Objective Swallowing Evaluation: Modified Barium Swallowing Study   Patient Details  Name: [name] MRN: [id number] Date of Birth: [birth year]  Today's Date: 02/02/2014 Time: 1000-1030 SLP Time Calculation (min): 30 min  Past Medical History:  Past Medical History  Diagnosis Date  . Diabetes mellitus  without complication   . Hypertension   . Hypercholesteremia    Past Surgical History:  Past Surgical History  Procedure Laterality Date  . Prostatectomy    . Colon resection     HPI:  73 year old male with HTN and DM presented 5/27 to ED c/o N/V  since 5/25. In ED found to be in acute renal failure,  hyperkalemic, hypotensive with SBP's in 70s. Started on BiPAP for  hypoxia and increased WOB. Intubated from 5/28 to 6/1. Pt with  ICU delirium. Admission Chest CXR says New confluent bibasilar  pulmonary opacity, nonspecific but consider bilateral pneumonia  or aspiration. Pt has undergone multiple MBS studies with  yesterday's study showing improved swallow function to allow po  intake with strict precautions.       Assessment / Plan / Recommendation Clinical Impression  Dysphagia Diagnosis: Moderate pharyngeal phase  dysphagia;Moderate cervical esophageal phase dysphagia Clinical impression: Pt demonstrates swallow function that is  likely his baseline. Strength has continued to improve with  minimal oropharygneal residuals even with solid textures. 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 Pt  consistently has trace silent penetration to the cords with small  to normal sips of thin liquids. He had aspiration when trying to  swallow too quickly or tilting his head dramatically back. If pt  clears his  throat every 2-3 sips he can expel trace penetrates.  Again this function appears to be pts baseline. Recommend a dys 3  (mechanical soft) diet with thin liquids and an intermittent  throat clear with ongoing therapy to address compensatory  strategies.     Treatment Recommendation  Therapy as outlined in treatment plan below    Diet Recommendation Dysphagia 3 (Mechanical Soft);Thin liquid   Liquid Administration via: Cup;No straw Medication Administration: Whole meds with puree Supervision: Patient able to self feed;Full supervision/cueing  for compensatory strategies Compensations: Slow rate;Small sips/bites;Clear throat  intermittently;Follow solids with liquid Postural Changes and/or Swallow Maneuvers: Seated upright 90  degrees;Upright 30-60 min after meal;Out of bed for meals    Other  Recommendations Oral Care Recommendations: Oral care BID   Follow Up Recommendations  Inpatient Rehab    Frequency and Duration min 2x/week  2 weeks   Pertinent Vitals/Pain NA    SLP Swallow Goals     General HPI: 73 year old male with HTN and DM presented 5/27 to  ED c/o N/V since 5/25. In ED found to be in acute renal failure,  hyperkalemic, hypotensive with SBP's in 70s. Started on BiPAP for  hypoxia and increased WOB. Intubated from 5/28 to 6/1. Pt with  ICU delirium. Admission Chest CXR says New confluent bibasilar  pulmonary opacity, nonspecific but consider bilateral pneumonia  or aspiration. Pt has undergone multiple MBS studies with  yesterday's study showing improved swallow function to allow po  intake with strict precautions.   Type of Study: Modified Barium Swallowing Study Reason for Referral: Objectively evaluate swallowing function Diet Prior to this Study: Dysphagia 3 (soft);Nectar-thick liquids Temperature Spikes Noted: No Respiratory Status: Room air History of Recent Intubation: Yes Length of Intubations (days): 5 days Date extubated: 01/18/14 Behavior/Cognition: Alert;Cooperative;Pleasant mood Oral Cavity -  Dentition: Dentures, top;Dentures, bottom Oral Motor / Sensory Function: Within functional limits Self-Feeding Abilities: Able to feed self Patient Positioning: Upright in chair Baseline Vocal Quality: Clear Volitional Cough: Strong Volitional Swallow: Able to elicit Anatomy: Other (Comment) (bony protrustion at C5/6, Prominent CP,  no  radiologist pres) Pharyngeal Secretions: Not observed secondary MBS    Reason for Referral Objectively evaluate swallowing function   Oral Phase Oral Preparation/Oral Phase Oral Phase: WFL Oral - Honey Oral - Honey Teaspoon: Not tested Oral - Nectar Oral - Nectar Teaspoon: Not tested Oral - Nectar Cup: Not tested Oral - Thin Oral - Thin Teaspoon: Not tested Oral - Thin Straw: Within functional limits (with a chin tuck) Oral - Solids Oral - Puree: Within functional limits   Pharyngeal Phase Pharyngeal Phase Pharyngeal Phase: Impaired Pharyngeal - Honey Pharyngeal - Honey Teaspoon: Not tested Pharyngeal - Nectar Pharyngeal - Nectar Teaspoon: Not tested Pharyngeal - Nectar Cup: Not tested Pharyngeal - Nectar Straw: Not tested Pharyngeal - Thin Pharyngeal - Thin Teaspoon: Not tested Pharyngeal - Thin Cup: Delayed swallow initiation;Reduced  laryngeal elevation;Reduced airway/laryngeal  closure;Penetration/Aspiration before swallow;Trace aspiration Penetration/Aspiration details (thin cup): Material enters  airway, CONTACTS cords and not ejected out;Material enters  airway, passes BELOW cords without attempt by patient to eject  out (silent aspiration) Pharyngeal - Thin Straw: Delayed swallow initiation;Reduced  laryngeal elevation;Reduced airway/laryngeal  closure;Penetration/Aspiration before swallow;Trace aspiration Penetration/Aspiration details (thin straw): Material enters  airway, CONTACTS cords and not ejected out Pharyngeal - Solids Pharyngeal - Puree: Pharyngeal residue - valleculae;Reduced  laryngeal elevation;Reduced epiglottic inversion Penetration/Aspiration details (puree):  Material does not enter  airway Pharyngeal - Regular: Pharyngeal residue - valleculae;Reduced  laryngeal elevation;Reduced epiglottic inversion  Cervical Esophageal Phase    GO    Cervical Esophageal Phase Cervical Esophageal Phase: Impaired Cervical Esophageal Phase - Comment Cervical Esophageal Comment: see impression statement        Herbie Baltimore, MA CCC-SLP [phone]  Lynann Beaver 02/02/2014, 10:51 AM    Dg Swallowing Func-speech Pathology  01/26/2014   Katherene Ponto Deblois, CCC-SLP     01/26/2014 12:49 PM Objective Swallowing Evaluation: Modified Barium Swallowing Study   Patient Details  Name: [name] MRN: [id number] Date of Birth: [birth year]  Today's Date: 01/26/2014 Time: 1308-6578 SLP Time Calculation (min): 19 min  Past Medical History:  Past Medical History  Diagnosis Date  . Diabetes mellitus without complication   . Hypertension   . Hypercholesteremia    Past Surgical History:  Past Surgical History  Procedure Laterality Date  . Prostatectomy    . Colon resection     HPI:  73 year old male with HTN and DM presented 5/27 to ED c/o N/V  since 5/25. In ED found to be in acute renal failure,  hyperkalemic, hypotensive with SBP's in 70s. Started on BiPAP for  hypoxia and increased WOB. Intubated from 5/28 to 6/1. Pt with  ICU delirium. Admission Chest CXR says New confluent bibasilar  pulmonary opacity, nonspecific but consider bilateral pneumonia  or aspiration.      Assessment / Plan / Recommendation Clinical Impression  Dysphagia Diagnosis: Mild cervical esophageal phase  dysphagia;Suspected primary esophageal dysphagia;Mild pharyngeal  phase dysphagia Clinical impression: Overall strength and timeliness of swallow  has significantly improved though mild to moderate dyspahgia  persists. Reduced hyoid excursion and base of tongue retraction  leads to mild to moderate vallecular residuals that, with thin  liquids, are silently penetrated/aspirated after the swallow.  Quantity and severity of  penetration with nectar thick liquids is  trace and is easily cleared with occasional throat clear. The  primary problem is likely more related to cervical esophageal and  possibly esopahgeal function. There is a prominent CP, reduced  opening of UES and pt was noted to have significant esophageal  stasis building up to proximal esophagus with one instance of  backflow to pharynx. Pt is recommended to consume a dys 3  (mechanical soft) diet with nectar thick liquids, intermittent  throat clear and esophageal precautions. Discussed concerns with  MD. Kerby Moors diet will reduce risk, but still pt may aspirate  esophageal backflow.     Treatment Recommendation  Therapy as outlined in treatment plan below    Diet Recommendation Dysphagia 3 (Mechanical Soft);Nectar-thick  liquid   Liquid Administration via: Cup;Straw Medication Administration: Crushed with puree Supervision: Patient able to self feed;Full supervision/cueing  for compensatory strategies Compensations: Slow rate;Small sips/bites;Clear throat  intermittently;Follow solids with liquid Postural Changes and/or Swallow Maneuvers: Seated upright 90  degrees;Upright 30-60 min after meal;Out of bed for meals    Other  Recommendations Oral Care Recommendations: Oral care BID Other Recommendations: Order thickener from pharmacy   Follow Up Recommendations  Inpatient Rehab    Frequency and Duration min 2x/week  2 weeks   Pertinent Vitals/Pain NA    SLP Swallow Goals     General HPI: 73 year old male with HTN and DM presented 5/27 to  ED c/o N/V since 5/25. In ED found to be in acute renal failure,  hyperkalemic, hypotensive with SBP's in 70s. Started on BiPAP for  hypoxia and increased WOB. Intubated from 5/28 to 6/1. Pt with  ICU delirium. Admission Chest CXR says New confluent bibasilar  pulmonary opacity, nonspecific but consider bilateral pneumonia  or aspiration.  Type of Study: Modified Barium Swallowing Study Reason for Referral: Objectively evaluate swallowing  function Previous Swallow Assessment: MBS 01/21/14 - NPO Diet Prior to this Study: NPO;Panda Temperature Spikes Noted: No Respiratory Status: Room air History of Recent Intubation: Yes Length of Intubations (days): 5 days Date extubated: 01/18/14 Behavior/Cognition: Alert;Cooperative;Requires  cueing;Distractible Oral Cavity - Dentition: Dentures, top;Dentures, bottom Oral Motor / Sensory Function: Impaired - see Bedside swallow  eval Self-Feeding Abilities: Able to feed self Patient Positioning: Upright in chair Baseline Vocal Quality: Clear Volitional Cough: Strong Volitional Swallow: Able to elicit Anatomy: Other (Comment) (Protrusion at c5/6, prominent CP) Pharyngeal Secretions: Not observed secondary MBS    Reason for Referral Objectively evaluate swallowing function   Oral Phase Oral Preparation/Oral Phase Oral Phase: WFL   Pharyngeal Phase Pharyngeal Phase Pharyngeal Phase: Impaired Pharyngeal - Honey Pharyngeal - Honey Teaspoon: Reduced epiglottic inversion;Reduced  anterior laryngeal mobility;Reduced laryngeal  elevation;Pharyngeal residue - valleculae;Pharyngeal residue - cp  segment Penetration/Aspiration details (honey teaspoon): Material does  not enter airway Pharyngeal - Nectar Pharyngeal - Nectar Teaspoon: Reduced epiglottic  inversion;Reduced anterior laryngeal mobility;Reduced laryngeal  elevation;Pharyngeal residue - valleculae;Pharyngeal residue - cp  segment Penetration/Aspiration details (nectar teaspoon): Material does  not enter airway Pharyngeal - Nectar Cup: Reduced epiglottic inversion;Reduced  anterior laryngeal mobility;Reduced laryngeal  elevation;Pharyngeal residue - valleculae;Pharyngeal residue - cp  segment;Penetration/Aspiration after swallow Penetration/Aspiration details (nectar cup): Material enters  airway, remains ABOVE vocal cords and not ejected out;Material  does not enter airway Pharyngeal - Nectar Straw: Reduced epiglottic inversion;Reduced  anterior laryngeal  mobility;Reduced laryngeal  elevation;Pharyngeal residue - valleculae;Pharyngeal residue - cp  segment;Penetration/Aspiration after swallow Penetration/Aspiration details (nectar straw): Material enters  airway, remains ABOVE vocal cords and not ejected out;Material  does not enter airway Pharyngeal - Thin Pharyngeal - Thin Teaspoon: Not tested Pharyngeal - Thin Cup: Reduced epiglottic inversion;Reduced  anterior laryngeal mobility;Reduced laryngeal  elevation;Pharyngeal residue - valleculae;Pharyngeal residue - cp  segment;Penetration/Aspiration after swallow;Trace aspiration Penetration/Aspiration details (thin cup): Material enters  airway, passes BELOW cords  without attempt by patient to eject  out (silent aspiration);Material enters airway, CONTACTS cords  and not ejected out Pharyngeal - Thin Straw: Not tested Pharyngeal - Solids Pharyngeal - Puree: Reduced epiglottic inversion;Reduced anterior  laryngeal mobility;Reduced laryngeal elevation;Pharyngeal residue  - valleculae;Pharyngeal residue - cp segment Penetration/Aspiration details (puree): Material does not enter  airway Pharyngeal - Regular: Reduced epiglottic inversion;Reduced  anterior laryngeal mobility;Reduced laryngeal  elevation;Pharyngeal residue - valleculae;Pharyngeal residue - cp  segment Pharyngeal - Pill: Not tested  Cervical Esophageal Phase    GO    Cervical Esophageal Phase Cervical Esophageal Phase: Impaired Cervical Esophageal Phase - Comment Cervical Esophageal Comment: see impression statement        Herbie Baltimore, MA CCC-SLP [phone]  Katherene Ponto Deblois 01/26/2014, 12:48 PM    Dg Swallowing Func-speech Pathology  01/21/2014   Katherene Ponto Deblois, CCC-SLP     01/21/2014 10:58 AM Objective Swallowing Evaluation: Modified Barium Swallowing Study   Patient Details  Name: [name] MRN: [id number] Date of Birth: 08-11-1941  Today's Date: 01/21/2014 Time: 0930-1000 SLP Time Calculation (min): 30 min  Past Medical History:  Past  Medical History  Diagnosis Date  . Diabetes mellitus without complication   . Hypertension   . Hypercholesteremia    Past Surgical History:  Past Surgical History  Procedure Laterality Date  . Prostatectomy    . Colon resection     HPI:  73 year old male with HTN and DM presented 5/27 to ED c/o N/V  since 5/25. In ED found to be in acute renal failure,  hyperkalemic, hypotensive with SBP's in 70s. Started on BiPAP for  hypoxia and increased WOB. Intubated from 5/28 to 6/1. Pt with  ICU delirium. Admission Chest CXR says New confluent bibasilar  pulmonary opacity, nonspecific but consider bilateral pneumonia  or aspiration.      Assessment / Plan / Recommendation Clinical Impression  Dysphagia Diagnosis: Moderate cervical esophageal phase  dysphagia;Severe pharyngeal phase dysphagia;Mild oral phase  dysphagia Clinical impression: Pt demonstrates a primary structural  cervical esophageal dysphagia, present at baseline, that is  likely worsed by pts mental status and generalized weakness. Pt  was awake, but required max cues to keep head up, bring cup to  lips and orally accept POs. Oral phase characterized by slow  transit. Pt noted to have the appearance of bony protrusion at  C5/6 and a prominent cricopharyngeus (no radiologist present to  confirm) as well as reduced laryngeal elevation and hyoid  excursion. These deficits result in incomplete airway closure  with silent aspiration during the swallow and residuals over the  CP segment and in the valleculae that are repentrated after the  swallow even if pt cued to clear his throat. Findings consistent  with possible admission with aspiration pna. Pt would be at high  risk of aspiration at this time and would benefit from several  days of recovery prior to retesting and attempting POs. Improved  mental status and general strength would be indicators for  retest. Recommend NPO with short term alternate nutrition. SLP  will revisit pt on Monday for possiblity of  retest.     Treatment Recommendation  Therapy as outlined in treatment plan below    Diet Recommendation NPO;Alternative means - temporary   Medication Administration: Via alternative means    Other  Recommendations Oral Care Recommendations: Oral care Q4  per protocol   Follow Up Recommendations  Skilled Nursing facility    Frequency and Duration min 3x week  2 weeks  Pertinent Vitals/Pain NA    SLP Swallow Goals     General HPI: 74 year old male with HTN and DM presented 5/27 to  ED c/o N/V since 5/25. In ED found to be in acute renal failure,  hyperkalemic, hypotensive with SBP's in 70s. Started on BiPAP for  hypoxia and increased WOB. Intubated from 5/28 to 6/1. Pt with  ICU delirium. Admission Chest CXR says New confluent bibasilar  pulmonary opacity, nonspecific but consider bilateral pneumonia  or aspiration.  Type of Study: Modified Barium Swallowing Study Reason for Referral: Objectively evaluate swallowing function Previous Swallow Assessment: none in chart Diet Prior to this Study: NPO Temperature Spikes Noted: No Respiratory Status: Nasal cannula History of Recent Intubation: Yes Length of Intubations (days): 5 days Date extubated: 01/18/14 Behavior/Cognition: Alert;Cooperative;Requires  cueing;Distractible Oral Cavity - Dentition: Dentures, top;Dentures, bottom Oral Motor / Sensory Function: Impaired - see Bedside swallow  eval Self-Feeding Abilities: Able to feed self;Needs assist Patient Positioning: Upright in chair Baseline Vocal Quality: Low vocal intensity;Wet Volitional Cough: Strong Volitional Swallow: Able to elicit Anatomy:  (bony protrustion at C5/6, Prominent CP, no radiologist  pres.) Pharyngeal Secretions:  (suspect standing secretions)    Reason for Referral Objectively evaluate swallowing function   Oral Phase Oral Preparation/Oral Phase Oral Phase: Impaired Oral - Honey Oral - Honey Teaspoon: Delayed oral transit Oral - Nectar Oral - Nectar Teaspoon: Delayed oral transit Oral - Nectar  Cup: Delayed oral transit Oral - Thin Oral - Thin Teaspoon: Right anterior bolus loss;Left anterior  bolus loss;Delayed oral transit Oral - Thin Straw: Delayed oral transit (pt struggles to tip head  and cup back for thin cup) Oral - Solids Oral - Puree: Within functional limits   Pharyngeal Phase Pharyngeal Phase Pharyngeal Phase: Impaired Pharyngeal - Honey Pharyngeal - Honey Teaspoon: Reduced anterior laryngeal  mobility;Reduced epiglottic inversion;Reduced laryngeal  elevation;Reduced airway/laryngeal closure;Reduced tongue base  retraction;Penetration/Aspiration after  swallow;Penetration/Aspiration during swallow;Trace  aspiration;Pharyngeal residue - valleculae;Pharyngeal residue -  cp segment;Pharyngeal residue - pyriform sinuses;Compensatory  strategies attempted (Comment) (head turn left - not effective) Penetration/Aspiration details (honey teaspoon): Material enters  airway, CONTACTS cords and not ejected out;Material enters  airway, passes BELOW cords without attempt by patient to eject  out (silent aspiration) Pharyngeal - Nectar Pharyngeal - Nectar Teaspoon: Reduced anterior laryngeal  mobility;Reduced epiglottic inversion;Reduced laryngeal  elevation;Reduced airway/laryngeal closure;Reduced tongue base  retraction;Penetration/Aspiration after  swallow;Penetration/Aspiration during swallow;Trace  aspiration;Pharyngeal residue - valleculae;Pharyngeal residue -  cp segment;Pharyngeal residue - pyriform sinuses;Compensatory  strategies attempted (Comment) Penetration/Aspiration details (nectar teaspoon): Material enters  airway, CONTACTS cords and not ejected out;Material enters  airway, passes BELOW cords without attempt by patient to eject  out (silent aspiration) Pharyngeal - Nectar Cup: Reduced anterior laryngeal  mobility;Reduced epiglottic inversion;Reduced laryngeal  elevation;Reduced airway/laryngeal closure;Reduced tongue base  retraction;Penetration/Aspiration after   swallow;Penetration/Aspiration during swallow;Trace  aspiration;Pharyngeal residue - valleculae;Pharyngeal residue -  cp segment;Pharyngeal residue - pyriform sinuses;Compensatory  strategies attempted (Comment) Penetration/Aspiration details (nectar cup): Material enters  airway, CONTACTS cords and not ejected out;Material enters  airway, passes BELOW cords without attempt by patient to eject  out (silent aspiration) Pharyngeal - Thin Pharyngeal - Thin Teaspoon: Not tested (all spilled from mouth) Pharyngeal - Thin Straw: Reduced anterior laryngeal  mobility;Reduced epiglottic inversion;Reduced laryngeal  elevation;Reduced airway/laryngeal closure;Reduced tongue base  retraction;Penetration/Aspiration during swallow;Pharyngeal  residue - valleculae;Pharyngeal residue - cp segment;Pharyngeal  residue - pyriform sinuses;Compensatory strategies attempted  (Comment);Moderate aspiration Penetration/Aspiration details (thin straw): Material enters  airway, passes BELOW cords without  attempt by patient to eject  out (silent aspiration) Pharyngeal - Solids Pharyngeal - Puree: Reduced anterior laryngeal mobility;Reduced  epiglottic inversion;Reduced laryngeal elevation;Reduced  airway/laryngeal closure;Reduced tongue base  retraction;Pharyngeal residue - valleculae;Pharyngeal residue -  cp segment;Pharyngeal residue - pyriform sinuses;Compensatory  strategies attempted (Comment) Penetration/Aspiration details (puree): Material does not enter  airway  Cervical Esophageal Phase    GO    Cervical Esophageal Phase Cervical Esophageal Phase: Impaired Cervical Esophageal Phase - Comment Cervical Esophageal Comment: see impression statement        Herbie Baltimore, MA CCC-SLP [phone]  Katherene Ponto Deblois 01/21/2014, 10:56 AM     Medications: Scheduled Meds: . antiseptic oral rinse  15 mL Mouth Rinse BID  . aspirin  81 mg Oral Daily  . carvedilol  3.125 mg Oral BID WC  . darbepoetin (ARANESP) injection - NON-DIALYSIS  100  mcg Subcutaneous Q Mon-1800  . feeding supplement (ENSURE)  1 Container Oral BID BM  . insulin aspart  0-24 Units Subcutaneous TID WC  . pantoprazole  40 mg Oral QAC breakfast      LOS: 21 days   RAI,RIPUDEEP M.D. [facility] 02/03/2014, 1:26 PM Pager: [phone]  If 7PM-7AM, please contact night-coverage [email or web address] Password [email or web address]  **Disclaimer: This note was dictated with voice recognition software. Similar sounding words can inadvertently be transcribed and this note may contain transcription errors which may not have been corrected upon publication of note.**

## 2014-02-03 NOTE — Progress Notes (Signed)
Physical Therapy Treatment Patient Details Name: [name] MRN: [id number] DOB: May 30, 1941 Today's Date: 02/03/2014    History of Present Illness 73 year old male with HTN and DM presented 5/27 to ED c/o N/V since 5/25. In ED found to be in acute renal failure, hyperkalemic, hypotensive with SBP's in 70s. Started on BiPAP for hypoxia and increased WOB. intubated 5/28 and extubated 6/1 Pt currently with Hemodialysis Catheter Left side of neck    PT Comments    BP significant for Orthostatic Hypotension today; Still, pt more participatory and willing to try getting up (with less encouragement needed)  As his activity tolerance increases, it may be worth considering CIR for postacute rehab  Follow Up Recommendations  SNF;Supervision/Assistance - 24 hour     Equipment Recommendations  Rolling walker with 5" wheels;3in1 (PT)    Recommendations for Other Services       Precautions / Restrictions Precautions Precautions: Fall Precaution Comments: Orthostatic during 6/17 session    Mobility  Bed Mobility Overal bed mobility: Needs Assistance Bed Mobility: Supine to Sit Rolling: Min assist Sidelying to sit: Min assist       General bed mobility comments: assist for LEs, required extra time  Transfers Overall transfer level: Needs assistance Equipment used: Rolling walker (2 wheeled) Transfers: Sit to/from [facility] Sit to Stand: Min assist;+2 safety/equipment Stand pivot transfers: Mod assist;+2 safety/equipment       General transfer comment: Very minimal assist for initial sit to stand; cues for hand placement and safety; mod assist for basic pivot steps to recliner with second person present fo rsafety as pt reported some lightheadedness with standing, and his standing systolic BP was approx 30 mmHg less than his supine  Ambulation/Gait             General Gait Details: progressive amb held today due to orthostatic hypotension   Stairs             Wheelchair Mobility    Modified Rankin (Stroke Patients Only)       Balance Overall balance assessment: Needs assistance   Sitting balance-Leahy Scale: Good     Standing balance support: Bilateral upper extremity supported Standing balance-Leahy Scale: Poor Standing balance comment: Noted systolic BP drop in standing                    Cognition Arousal/Alertness: Awake/alert Behavior During Therapy: WFL for tasks assessed/performed Overall Cognitive Status: Within Functional Limits for tasks assessed                      Exercises      General Comments        Pertinent Vitals/Pain See vitals flow sheet.     Home Living                      Prior Function            PT Goals (current goals can now be found in the care plan section) Acute Rehab PT Goals PT Goal Formulation: With patient Time For Goal Achievement: 02/04/14 Potential to Achieve Goals: Good Progress towards PT goals: Progressing toward goals (with bed mobility, sitting tol; standing tol is limited)    Frequency  Min 3X/week    PT Plan Current plan remains appropriate    Co-evaluation             End of Session Equipment Utilized During Treatment: Gait belt Activity Tolerance: Patient limited  by fatigue Patient left: in chair;with call bell/phone within reach;with family/visitor present     Time: 7989-2119 PT Time Calculation (min): 30 min  Charges:  $Therapeutic Activity: 23-37 mins                    G Codes:      [name] 02/03/2014, 9:35 AM  Roney Marion, PT  Acute Rehabilitation Services Pager [phone] Office [phone]

## 2014-02-04 LAB — RENAL FUNCTION PANEL
ALBUMIN: 2.3 g/dL — AB (ref 3.5–5.2)
BUN: 66 mg/dL — ABNORMAL HIGH (ref 6–23)
CHLORIDE: 95 meq/L — AB (ref 96–112)
CO2: 25 mEq/L (ref 19–32)
Calcium: 8 mg/dL — ABNORMAL LOW (ref 8.4–10.5)
Creatinine, Ser: 5.84 mg/dL — ABNORMAL HIGH (ref 0.50–1.35)
GFR calc Af Amer: 10 mL/min — ABNORMAL LOW (ref >90)
GFR calc non Af Amer: 9 mL/min — ABNORMAL LOW (ref >90)
GLUCOSE: 104 mg/dL — AB (ref 70–99)
POTASSIUM: 3.9 meq/L (ref 3.7–5.3)
Phosphorus: 7.6 mg/dL — ABNORMAL HIGH (ref 2.3–4.6)
Sodium: 140 mEq/L (ref 137–147)

## 2014-02-04 LAB — CBC
HEMATOCRIT: 29.5 % — AB (ref 39.0–52.0)
Hemoglobin: 9.3 g/dL — ABNORMAL LOW (ref 13.0–17.0)
MCH: 28.3 pg (ref 26.0–34.0)
MCHC: 31.5 g/dL (ref 30.0–36.0)
MCV: 89.7 fL (ref 78.0–100.0)
PLATELETS: 249 10*3/uL (ref 150–400)
RBC: 3.29 MIL/uL — AB (ref 4.22–5.81)
RDW: 15.2 % (ref 11.5–15.5)
WBC: 10.1 10*3/uL (ref 4.0–10.5)

## 2014-02-04 LAB — GLUCOSE, CAPILLARY
GLUCOSE-CAPILLARY: 140 mg/dL — AB (ref 70–99)
GLUCOSE-CAPILLARY: 158 mg/dL — AB (ref 70–99)
GLUCOSE-CAPILLARY: 99 mg/dL (ref 70–99)
Glucose-Capillary: 103 mg/dL — ABNORMAL HIGH (ref 70–99)

## 2014-02-04 NOTE — Progress Notes (Signed)
Pt gone to dialysis. BP up due to going to dialysis.  Gave coreg 3.125 for BP. Pt gone to dialysis at this time.

## 2014-02-04 NOTE — Progress Notes (Signed)
NUTRITION FOLLOW UP  Intervention:   Discontinue Ensure Pudding. Pt reports that he doesn't like it. Continue Magic Cup supplements on meal trays. RD to continue to follow nutrition care plan and reassess need for education closer to d/c.  Nutrition Dx:   Inadequate oral intake now related to swallowing difficulty as evidenced by limited intake. Ongoing.  Goal:   Intake to meet >90% of estimated nutrition needs. Improving.  Monitor:   weight trends, lab trends, I/O's, PO intake, supplement tolerance  Assessment:   73 year old male with HTN and DM presented 5/27 to ED c/o N/V since 5/25. In ED found to be in acute renal failure, hyperkalemic, hypotensive with SBP's in 70s. Started on BiPAP for hypoxia and increased WOB. Required intubation on 5/28.  Patient was extubated on 6/1. CRRT off since 6/2. S/P multiple swallow evaluations with SLP; S/P MBS 6/4 - pt was found to not be ready for diet advancement at this time. Nepro enteral nutrition initiated via NGT on 6/4. Underwent additional MBSS on 6/9, and pt with Dysphagia 3 with Nectar Thickened Liquids. Patient reports that his appetite is improving and eating as much as he can, some meals are better than others.  Renal continues to follow patient and assess need for HD daily.  CBG's: 103-159 Sodium and potassium WNL Phosphorus elevated at 7.6  Height: Ht Readings from Last 1 Encounters:  01/14/14 5\' 11"  (1.803 m)    Weight Status: Wt Readings from Last 1 Encounters:  02/03/14 178 lb 9.2 oz (81 kg)  78.7 kg s/p HD 6/8 79.5 kg s/p HD 6/6  Admission weight: 87.1 kg  Body mass index is 24.92 kg/(m^2). WNL  Re-estimated needs:  Kcal: 2000-2300 Protein: 95-110 gm Fluid: 1.2 L  Skin: stage I to R sacrum  Diet Order: Dysphagia 3; nectar thick liquids   Intake/Output Summary (Last 24 hours) at 02/04/14 1341 Last data filed at 02/04/14 0900  Gross per 24 hour  Intake    480 ml  Output    700 ml  Net   -220 ml    Last  BM: 6/13    Labs:   Recent Labs Lab 02/02/14 0507 02/03/14 1135 02/04/14 0545  NA 142 138 140  K 4.5 4.1 3.9  CL 95* 95* 95*  CO2 25 25 25   BUN 103* 51* 66*  CREATININE 8.22* 4.80* 5.84*  CALCIUM 7.6* 8.1* 8.0*  PHOS 11.7* 7.5* 7.6*  GLUCOSE 97 184* 104*    CBG (last 3)   Recent Labs  02/03/14 2150 02/04/14 0755 02/04/14 1210  GLUCAP 159* 103* 140*    Scheduled Meds: . antiseptic oral rinse  15 mL Mouth Rinse BID  . aspirin  81 mg Oral Daily  . carvedilol  3.125 mg Oral BID WC  . darbepoetin (ARANESP) injection - NON-DIALYSIS  100 mcg Subcutaneous Q Mon-1800  . feeding supplement (ENSURE)  1 Container Oral BID BM  . insulin aspart  0-24 Units Subcutaneous TID WC  . pantoprazole  40 mg Oral QAC breakfast    Continuous Infusions:    Inda Coke MS, RD, LDN Inpatient Registered Dietitian Pager: [phone] After-hours pager: [phone]

## 2014-02-04 NOTE — Progress Notes (Signed)
Patient ID: [name]  male  EXB:[id number]    DOB: 11/15/1940    DOA: 01/13/2014  PCP: No primary provider on file.  Assessment/Plan: Principal Problem:  Hypotensive/Septic shock, aspiration pneumonia: Resolved. Patient met criteria given hypotension, end organ damage and required pressor support. Source felt to be pneumonia secondary to aspiration.  - Completed antibiotic course, continue dysphagia 3 diet.   Active problems Acute renal failure now in the setting of chronic renal disease. Improving, Nephrology following. - Creatinine 5.8, hemodialysis today - Catheter replaced 1/02  Metabolic acidosis: Secondary to uremia. Initially from septic shock.   Hyperkalemia: Secondary renal failure. Initially treated with dialysis. Manage potassium with dialysis or emergency Kayexalate if needed   Thrombocytopenia: Resolved. Secondary to thrombocytopenia critical illness. Improving   Anemia: Secondary to chronic renal disease and illness. Hb stable at 9.3  Atrial flutter: On 6/16, Patient started developing episodes of significant tachycardia during dialysis. Rhythm assessment found to be aflutter. Treating with IV Lopressor.   Diabetes mellitus:  - CBG stable. On sliding scale only. A1c 5.5.  - At this point, discontinue metformin permanently given renal failure and try to manage with dietary control only   Dysphagia: In part due to mentation. Initially at dysphagia 3 with nectar thick liquids. Upgraded by speech therapy to than likely this on 6/16. Patient has history of presbyesophagus. Speech therapy should follow even at skilled nursing facility   HTN: Slightly elevated. Started on beta blocker 6/13   Hyperlipidemia   Diastolic heart failure: Echocardiogram done in the setting of ?Non-STEMI. Grade 2 diastolic dysfunction noted. Once renal function assessed, we'll manage it with dialysis versus diuretics. Low dose Coreg initiated.   NSTEMI: Demand ischemia in the setting of  septic shock. Resolved.   Toe ulceration: Patient was scheduled by orthopedic surgery to have planned left fourth toe amputation done 6/25. Hospitalist left message for need for postponement   DVT Prophylaxis:  Code Status:  Family Communication: discussed with [name], patient's wife on the phone   Disposition: Will need skilled nursing facility  Consultants:   nephrology Critical Care  Interventional radiology  Speech therapy   Procedures:  Status post intubation on 5/28-extubated 6/1  Temporary dialysis catheter placed 5/28, patient removed it on 6/11  CVVH started 5/20  Echo 5/28 >> EF 65 to 72%, grade 2 diastolic dysfx, mod AS  Placement of dialysis catheter by interventional radiology on 6/14      Antibiotics:  Zosyn 5/27 >>> 6/03  Vancomycin 5/27 >>> 6/02  Subjective: Feeling a lot better today, no acute complaints  Objective: Weight change: -1.7 kg (-3 lb 12 oz)  Intake/Output Summary (Last 24 hours) at 02/04/14 1409 Last data filed at 02/04/14 0900  Gross per 24 hour  Intake    480 ml  Output    700 ml  Net   -220 ml   Blood pressure 140/86, pulse 87, temperature 97.8 F (36.6 C), temperature source Oral, resp. rate 18, height 5' 11"  (1.803 m), weight 81 kg (178 lb 9.2 oz), SpO2 95.00%.  Physical Exam: General: Alert and awake, oriented, frail not in any acute distress. CVS: S1-S2 clear, 3/6 holosystolic murmur rubs or gallops Chest: clear to auscultation bilaterally, no wheezing, rales or rhonchi Abdomen: soft nontender, nondistended, normal bowel sounds  Extremities: no cyanosis, clubbing or edema noted bilaterally Neuro: Cranial nerves II-XII intact, no focal neurological deficits  Lab Results: Basic Metabolic Panel:  Recent Labs Lab 02/03/14 1135 02/04/14 0545  NA 138 140  K 4.1 3.9  CL 95* 95*  CO2 25 25  GLUCOSE 184* 104*  BUN 51* 66*  CREATININE 4.80* 5.84*  CALCIUM 8.1* 8.0*  PHOS 7.5* 7.6*   Liver Function Tests:  Recent  Labs Lab 02/03/14 1135 02/04/14 0545  ALBUMIN 2.2* 2.3*   No results found for this basename: LIPASE, AMYLASE,  in the last 168 hours No results found for this basename: AMMONIA,  in the last 168 hours CBC:  Recent Labs Lab 02/03/14 1135 02/04/14 0541  WBC 8.5 10.1  HGB 9.1* 9.3*  HCT 28.5* 29.5*  MCV 88.8 89.7  PLT 235 249   Cardiac Enzymes: No results found for this basename: CKTOTAL, CKMB, CKMBINDEX, TROPONINI,  in the last 168 hours BNP: No components found with this basename: POCBNP,  CBG:  Recent Labs Lab 02/03/14 1131 02/03/14 1652 02/03/14 2150 02/04/14 0755 02/04/14 1210  GLUCAP 166* 101* 159* 103* 140*     Micro Results: No results found for this or any previous visit (from the past 240 hour(s)).  Studies/Results: Dg Abd 1 View  01/23/2014   CLINICAL DATA:  Feeding tube placement.  EXAM: ABDOMEN - 1 VIEW  COMPARISON:  01/21/2014  FINDINGS: Feeding tube tip is seen with tip in the fundus of the stomach. Some residual contrast is seen within the colon. No dilated bowel loops seen.  IMPRESSION: Feeding tube tip in fundus of stomach.   Electronically Signed   By: Earle Gell M.D.   On: 01/23/2014 18:57   Dg Esophagus  01/26/2014   CLINICAL DATA:  Dysphagia. Modified swallow earlier in the morning only clearing patient for nectar thickness.  EXAM: ESOPHOGRAM/BARIUM SWALLOW  TECHNIQUE: Single contrast examination was performed using  thick barium.  FLUOROSCOPY TIME:  3 min and 20 is seconds  COMPARISON:  Chest radiograph 01/19/2014.  FINDINGS: Focused single-contrast exam was performed in a supine, mildly LPO position. The bed was maintained partially upright.  Evaluation of a single swallow demonstrates contrast stasis throughout the mid and upper thoracic esophagus.  Full column evaluation of the esophagus demonstrates no persistent narrowing or stricture.  13 mm barium tablet has initial delay in the vallecula. On subsequent swallows, the tablet passes into the  esophagus and then into the stomach.  IMPRESSION: 1. Moderately degraded exam. 2. 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 3. No evidence of  stricture other anatomic cause for dysphagia. 4. Delayed passage of the barium tablet at the level of the vallecula.   Electronically Signed   By: Abigail Miyamoto M.D.   On: 01/26/2014 16:14   US Renal  01/14/2014   CLINICAL DATA:  Renal failure, history hypertension, diabetes  EXAM: RENAL/URINARY TRACT ULTRASOUND COMPLETE  COMPARISON:  None  FINDINGS: Right Kidney:  Length: 12.4 cm length. Cortical thickness up to 1.8 cm thick. Increased cortical echogenicity. Hypoechoic nodule medial aspect mid RIGHT kidney 2.1 x 2.2 x 2.1 cm containing scattered internal echoes question complicated cyst. No additional mass, hydronephrosis or shadowing calcification.  Left Kidney:  Length: 10.5 cm length. Cortical thickness up to 2.0 cm thick. Increased cortical echogenicity. Minimally complicated cyst at mid LEFT kidney 2.7 x 3.3 x 3.7 cm containing slightly irregular wall and scattered internal echoes. No additional mass, hydronephrosis or shadowing calcification.  Bladder:  Contains only minimal urine. Minimally enlarged central lobe of prostate.  IMPRESSION: Increased renal cortical echogenicity bilaterally consistent with medical renal disease.  Mildly complicated cysts within the mid portions of both kidneys.  No evidence of hydronephrosis.  Electronically Signed   By: Lavonia Dana M.D.   On: 01/14/2014 02:31   Ir Fluoro Guide Cv Line Right  01/31/2014   INDICATION: Renal failure and needs access for hemodialysis.  EXAM: FLUOROSCOPIC AND ULTRASOUND GUIDED PLACEMENT OF A TUNNELED DIALYSIS CATHETER  Physician: Stephan Minister. Anselm Pancoast, MD  FLUOROSCOPY TIME:  24 seconds  MEDICATIONS: 0.5 mg versed, 25 mcg fentanyl. A radiology nurse monitored the patient for moderate sedation.  ANESTHESIA/SEDATION: Moderate sedation time: 19 minutes  PROCEDURE: Informed consent was obtained for  placement of a tunneled dialysis catheter. The patient was placed supine on the interventional table. Ultrasound confirmed a patent right internal jugularvein. Ultrasound images were obtained for documentation. The right side of the neck was prepped and draped in a sterile fashion. The right side of the neck was anesthetized with 1% lidocaine. Maximal barrier sterile technique was utilized including caps, mask, sterile gowns, sterile gloves, sterile drape, hand hygiene and skin antiseptic. A small incision was made with #11 blade scalpel. A 21 gauge needle directed into the right internal jugular vein with ultrasound guidance. A micropuncture dilator set was placed. A 19 cm tip to cuff HemoSplit catheter was selected. The skin below the right clavicle was anesthetized and a small incision was made with an #11 blade scalpel. A subcutaneous tunnel was formed to the vein dermatotomy site. The catheter was brought through the tunnel. The vein dermatotomy site was dilated to accommodate a peel-away sheath. The catheter was placed through the peel-away sheath and directed into the central venous structures. The tip of the catheter was placed in the lower SVC with fluoroscopy. Fluoroscopic images were obtained for documentation. Both lumens were found to aspirate and flush well. The proper amount of heparin was flushed in both lumens. The vein dermatotomy site was closed using a single layer of absorbable suture and Dermabond. The catheter was secured to the skin using Prolene suture.  FINDINGS: Catheter tip in the lower SVC.  COMPLICATIONS: None  IMPRESSION: Successful placement of a right jugular tunneled dialysis catheter using ultrasound and fluoroscopic guidance.   Electronically Signed   By: Markus Daft M.D.   On: 01/31/2014 13:45   Ir US Guide Vasc Access Right  01/31/2014   INDICATION: Renal failure and needs access for hemodialysis.  EXAM: FLUOROSCOPIC AND ULTRASOUND GUIDED PLACEMENT OF A TUNNELED DIALYSIS  CATHETER  Physician: Stephan Minister. Anselm Pancoast, MD  FLUOROSCOPY TIME:  24 seconds  MEDICATIONS: 0.5 mg versed, 25 mcg fentanyl. A radiology nurse monitored the patient for moderate sedation.  ANESTHESIA/SEDATION: Moderate sedation time: 19 minutes  PROCEDURE: Informed consent was obtained for placement of a tunneled dialysis catheter. The patient was placed supine on the interventional table. Ultrasound confirmed a patent right internal jugularvein. Ultrasound images were obtained for documentation. The right side of the neck was prepped and draped in a sterile fashion. The right side of the neck was anesthetized with 1% lidocaine. Maximal barrier sterile technique was utilized including caps, mask, sterile gowns, sterile gloves, sterile drape, hand hygiene and skin antiseptic. A small incision was made with #11 blade scalpel. A 21 gauge needle directed into the right internal jugular vein with ultrasound guidance. A micropuncture dilator set was placed. A 19 cm tip to cuff HemoSplit catheter was selected. The skin below the right clavicle was anesthetized and a small incision was made with an #11 blade scalpel. A subcutaneous tunnel was formed to the vein dermatotomy site. The catheter was brought through the tunnel. The vein dermatotomy site was dilated  to accommodate a peel-away sheath. The catheter was placed through the peel-away sheath and directed into the central venous structures. The tip of the catheter was placed in the lower SVC with fluoroscopy. Fluoroscopic images were obtained for documentation. Both lumens were found to aspirate and flush well. The proper amount of heparin was flushed in both lumens. The vein dermatotomy site was closed using a single layer of absorbable suture and Dermabond. The catheter was secured to the skin using Prolene suture.  FINDINGS: Catheter tip in the lower SVC.  COMPLICATIONS: None  IMPRESSION: Successful placement of a right jugular tunneled dialysis catheter using ultrasound and  fluoroscopic guidance.   Electronically Signed   By: Markus Daft M.D.   On: 01/31/2014 13:45   Dg Chest Port 1 View  01/19/2014   CLINICAL DATA:  Cough  EXAM: PORTABLE CHEST - 1 VIEW  COMPARISON:  01/18/2014  FINDINGS: Cardiac shadow is stable. A right jugular line and left jugular temporary dialysis catheter are again seen and stable. The endotracheal tube and nasogastric catheter have been removed. The lungs are well aerated bilaterally. Improved aeration is again seen bilaterally with persistent changes in the right lung base. No new focal infiltrate is seen.  IMPRESSION: Improved aeration with some persistent changes in the right base.  Tubes and lines as described.   Electronically Signed   By: Inez Catalina M.D.   On: 01/19/2014 06:53   Dg Chest Port 1 View  01/18/2014   CLINICAL DATA:  Check endotracheal tube position.  EXAM: PORTABLE CHEST - 1 VIEW  COMPARISON:  01/16/2014.  FINDINGS: Endotracheal tube terminates approximately 3.1 cm above the carina. Nasogastric tube is followed into the stomach. Right IJ central line tip projects over the low SVC. Left IJ catheter tip projects over the brachiocephalic vein confluence or upper SVC. Heart is enlarged, stable. Thoracic aorta is calcified. There is mild-to-moderate diffuse bilateral airspace disease with slight improvement in aeration at the right lung base. No definite pleural fluid.  IMPRESSION: Mild to moderate diffuse bilateral airspace disease, likely due to pulmonary edema. Slight improvement in aeration at the right lung base.   Electronically Signed   By: Lorin Picket M.D.   On: 01/18/2014 07:40   Dg Chest Port 1 View  01/16/2014   CLINICAL DATA:  Aspiration pneumonia  EXAM: PORTABLE CHEST - 1 VIEW  COMPARISON:  01/15/2014  FINDINGS: Endotracheal tube ends in the mid thoracic trachea. Unchanged positioning of bilateral IJ catheters, tips at the level of the SVC. Enteric tube enters the stomach.  Bibasilar airspace disease shows no progression  or clearing. No evidence of increasing pleural fluid or air leak.  IMPRESSION: 1. Stable positioning of tubes and lines. 2. Unchanged bibasilar pneumonia.   Electronically Signed   By: Jorje Guild M.D.   On: 01/16/2014 07:18   Dg Chest Port 1 View  01/15/2014   CLINICAL DATA:  Ventilator support.  Pneumonia.  EXAM: PORTABLE CHEST - 1 VIEW  COMPARISON:  01/14/2014  FINDINGS: Endotracheal tube has its tip 4 cm above the carina. Nasogastric tube enters the stomach. Right internal jugular central line has its tip in the SVC above the right atrium. Left internal jugular central line tips are in the SVC above the right atrium. Bilateral lower lobe airspace filling consistent with pneumonia persists, probably slightly improved since yesterday. The upper lungs remain clear. No effusions.  IMPRESSION: Lines and tubes well positioned. Slight improvement in bilateral lower lobe pneumonia.   Electronically Signed  By: Nelson Chimes M.D.   On: 01/15/2014 07:35   Dg Chest Port 1 View  01/14/2014   CLINICAL DATA:  Status post central line placement  EXAM: PORTABLE CHEST - 1 VIEW  COMPARISON:  01/14/2014 225 hr  FINDINGS: A left jugular dual-lumen central line is again identified and stable. A new right jugular central line is seen with the catheter tip at the cavoatrial junction. No pneumothorax is noted. The endotracheal tube is seen 5 cm above the carina. A nasogastric catheter is noted extending into the stomach. Bilateral lower lung infiltrates are seen. The overall appearance is stable. No new focal abnormality is noted.  IMPRESSION: Stable bilateral infiltrates.  New tubes and lines as described above without complicating factors.   Electronically Signed   By: Inez Catalina M.D.   On: 01/14/2014 09:33   Dg Chest Port 1 View  01/14/2014   CLINICAL DATA:  Hemodialysis catheter placement  EXAM: Portable exam 0225 hr compared to 01/13/2014  COMPARISON:  None.  FINDINGS: LEFT jugular dual-lumen central venous catheter  with tip projecting over mid SVC.  Enlargement of cardiac silhouette with pulmonary vascular congestion.  Perihilar and basilar infiltrates which could represent edema or infection.  No pneumothorax.  Bones unremarkable.  IMPRESSION: No pneumothorax following LEFT jugular line placement.  Enlargement of cardiac silhouette with pulmonary vascular congestion and diffuse increased pulmonary infiltrates, question edema versus infection.   Electronically Signed   By: Lavonia Dana M.D.   On: 01/14/2014 02:41   Dg Chest Port 1 View  01/13/2014   CLINICAL DATA:  73 year old male with back and knee pain. Shortness of Breath. Initial encounter.  EXAM: PORTABLE CHEST - 1 VIEW  COMPARISON:  11/21/2007.  FINDINGS: Portable AP supine view at 2211 hrs. New confluent bibasilar pulmonary opacity. No pneumothorax. Pulmonary vascularity within normal limits. Stable cardiac size and mediastinal contours. Visualized tracheal air column is within normal limits.  IMPRESSION: New confluent bibasilar pulmonary opacity, nonspecific but consider bilateral pneumonia or aspiration.   Electronically Signed   By: Lars Pinks M.D.   On: 01/13/2014 23:02   Dg Abd Portable 1v  01/23/2014   CLINICAL DATA:  Feeding tube placement.  EXAM: PORTABLE ABDOMEN - 1 VIEW  COMPARISON:  01/23/2014  FINDINGS: The Dobbhoff feeding tube is looped back on itself in the descending duodenum. The tip is in the antropyloric region of the stomach.  IMPRESSION: Dobbhoff feeding tube is looped back on itself in the descending duodenum.   Electronically Signed   By: Kalman Jewels M.D.   On: 01/23/2014 23:44   Dg Abd Portable 1v  01/21/2014   CLINICAL DATA:  Feeding tube placement.  EXAM: PORTABLE ABDOMEN - 1 VIEW  COMPARISON:  None.  FINDINGS: Weighted tip feeding tube is present in the proximal gastric fundus. Barium is present in small bowel. The bowel gas pattern is nonobstructive. Gaseous distention of the colon.  IMPRESSION: Weighted tip feeding tube in the  proximal gastric fundus.   Electronically Signed   By: Dereck Ligas M.D.   On: 01/21/2014 16:32   Dg Swallowing Func-speech Pathology  02/02/2014   Katherene Ponto Deblois, CCC-SLP     02/02/2014 10:53 AM Objective Swallowing Evaluation: Modified Barium Swallowing Study   Patient Details  Name: JD MCCASTER MRN: [id number] Date of Birth: 1940-10-08  Today's Date: 02/02/2014 Time: 1000-1030 SLP Time Calculation (min): 30 min  Past Medical History:  Past Medical History  Diagnosis Date  . Diabetes mellitus without complication   .  Hypertension   . Hypercholesteremia    Past Surgical History:  Past Surgical History  Procedure Laterality Date  . Prostatectomy    . Colon resection     HPI:  74 year old male with HTN and DM presented 5/27 to ED c/o N/V  since 5/25. In ED found to be in acute renal failure,  hyperkalemic, hypotensive with SBP's in 70s. Started on BiPAP for  hypoxia and increased WOB. Intubated from 5/28 to 6/1. Pt with  ICU delirium. Admission Chest CXR says New confluent bibasilar  pulmonary opacity, nonspecific but consider bilateral pneumonia  or aspiration. Pt has undergone multiple MBS studies with  yesterday's study showing improved swallow function to allow po  intake with strict precautions.       Assessment / Plan / Recommendation Clinical Impression  Dysphagia Diagnosis: Moderate pharyngeal phase  dysphagia;Moderate cervical esophageal phase dysphagia Clinical impression: Pt demonstrates swallow function that is  likely his baseline. Strength has continued to improve with  minimal oropharygneal residuals even with solid textures. 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 Pt  consistently has trace silent penetration to the cords with small  to normal sips of thin liquids. He had aspiration when trying to  swallow too quickly or tilting his head dramatically back. If pt  clears his throat every 2-3 sips he  can expel trace penetrates.  Again this function appears to be pts baseline. Recommend a dys 3  (mechanical soft) diet with thin liquids and an intermittent  throat clear with ongoing therapy to address compensatory  strategies.     Treatment Recommendation  Therapy as outlined in treatment plan below    Diet Recommendation Dysphagia 3 (Mechanical Soft);Thin liquid   Liquid Administration via: Cup;No straw Medication Administration: Whole meds with puree Supervision: Patient able to self feed;Full supervision/cueing  for compensatory strategies Compensations: Slow rate;Small sips/bites;Clear throat  intermittently;Follow solids with liquid Postural Changes and/or Swallow Maneuvers: Seated upright 90  degrees;Upright 30-60 min after meal;Out of bed for meals    Other  Recommendations Oral Care Recommendations: Oral care BID   Follow Up Recommendations  Inpatient Rehab    Frequency and Duration min 2x/week  2 weeks   Pertinent Vitals/Pain NA    SLP Swallow Goals     General HPI: 73 year old male with HTN and DM presented 5/27 to  ED c/o N/V since 5/25. In ED found to be in acute renal failure,  hyperkalemic, hypotensive with SBP's in 70s. Started on BiPAP for  hypoxia and increased WOB. Intubated from 5/28 to 6/1. Pt with  ICU delirium. Admission Chest CXR says New confluent bibasilar  pulmonary opacity, nonspecific but consider bilateral pneumonia  or aspiration. Pt has undergone multiple MBS studies with  yesterday's study showing improved swallow function to allow po  intake with strict precautions.   Type of Study: Modified Barium Swallowing Study Reason for Referral: Objectively evaluate swallowing function Diet Prior to this Study: Dysphagia 3 (soft);Nectar-thick liquids Temperature Spikes Noted: No Respiratory Status: Room air History of Recent Intubation: Yes Length of Intubations (days): 5 days Date extubated: 01/18/14 Behavior/Cognition: Alert;Cooperative;Pleasant mood Oral Cavity - Dentition: Dentures,  top;Dentures, bottom Oral Motor / Sensory Function: Within functional limits Self-Feeding Abilities: Able to feed self Patient Positioning: Upright in chair Baseline Vocal Quality: Clear Volitional Cough: Strong Volitional Swallow: Able to elicit Anatomy: Other (Comment) (bony protrustion at C5/6, Prominent CP,  no radiologist pres) Pharyngeal Secretions: Not  observed secondary MBS    Reason for Referral Objectively evaluate swallowing function   Oral Phase Oral Preparation/Oral Phase Oral Phase: WFL Oral - Honey Oral - Honey Teaspoon: Not tested Oral - Nectar Oral - Nectar Teaspoon: Not tested Oral - Nectar Cup: Not tested Oral - Thin Oral - Thin Teaspoon: Not tested Oral - Thin Straw: Within functional limits (with a chin tuck) Oral - Solids Oral - Puree: Within functional limits   Pharyngeal Phase Pharyngeal Phase Pharyngeal Phase: Impaired Pharyngeal - Honey Pharyngeal - Honey Teaspoon: Not tested Pharyngeal - Nectar Pharyngeal - Nectar Teaspoon: Not tested Pharyngeal - Nectar Cup: Not tested Pharyngeal - Nectar Straw: Not tested Pharyngeal - Thin Pharyngeal - Thin Teaspoon: Not tested Pharyngeal - Thin Cup: Delayed swallow initiation;Reduced  laryngeal elevation;Reduced airway/laryngeal  closure;Penetration/Aspiration before swallow;Trace aspiration Penetration/Aspiration details (thin cup): Material enters  airway, CONTACTS cords and not ejected out;Material enters  airway, passes BELOW cords without attempt by patient to eject  out (silent aspiration) Pharyngeal - Thin Straw: Delayed swallow initiation;Reduced  laryngeal elevation;Reduced airway/laryngeal  closure;Penetration/Aspiration before swallow;Trace aspiration Penetration/Aspiration details (thin straw): Material enters  airway, CONTACTS cords and not ejected out Pharyngeal - Solids Pharyngeal - Puree: Pharyngeal residue - valleculae;Reduced  laryngeal elevation;Reduced epiglottic inversion Penetration/Aspiration details (puree): Material does not  enter  airway Pharyngeal - Regular: Pharyngeal residue - valleculae;Reduced  laryngeal elevation;Reduced epiglottic inversion  Cervical Esophageal Phase    GO    Cervical Esophageal Phase Cervical Esophageal Phase: Impaired Cervical Esophageal Phase - Comment Cervical Esophageal Comment: see impression statement        Herbie Baltimore, MA CCC-SLP [phone]  Lynann Beaver 02/02/2014, 10:51 AM    Dg Swallowing Func-speech Pathology  01/26/2014   Katherene Ponto Deblois, CCC-SLP     01/26/2014 12:49 PM Objective Swallowing Evaluation: Modified Barium Swallowing Study   Patient Details  Name: SCOTLAND KORVER MRN: [id number] Date of Birth: 02-Sep-1940  Today's Date: 01/26/2014 Time: 5732-2025 SLP Time Calculation (min): 19 min  Past Medical History:  Past Medical History  Diagnosis Date  . Diabetes mellitus without complication   . Hypertension   . Hypercholesteremia    Past Surgical History:  Past Surgical History  Procedure Laterality Date  . Prostatectomy    . Colon resection     HPI:  73 year old male with HTN and DM presented 5/27 to ED c/o N/V  since 5/25. In ED found to be in acute renal failure,  hyperkalemic, hypotensive with SBP's in 70s. Started on BiPAP for  hypoxia and increased WOB. Intubated from 5/28 to 6/1. Pt with  ICU delirium. Admission Chest CXR says New confluent bibasilar  pulmonary opacity, nonspecific but consider bilateral pneumonia  or aspiration.      Assessment / Plan / Recommendation Clinical Impression  Dysphagia Diagnosis: Mild cervical esophageal phase  dysphagia;Suspected primary esophageal dysphagia;Mild pharyngeal  phase dysphagia Clinical impression: Overall strength and timeliness of swallow  has significantly improved though mild to moderate dyspahgia  persists. Reduced hyoid excursion and base of tongue retraction  leads to mild to moderate vallecular residuals that, with thin  liquids, are silently penetrated/aspirated after the swallow.  Quantity and severity of penetration with  nectar thick liquids is  trace and is easily cleared with occasional throat clear. The  primary problem is likely more related to cervical esophageal and  possibly esopahgeal function. There is a prominent CP, reduced  opening of UES and pt was noted to have significant esophageal  stasis building up to  proximal esophagus with one instance of  backflow to pharynx. Pt is recommended to consume a dys 3  (mechanical soft) diet with nectar thick liquids, intermittent  throat clear and esophageal precautions. Discussed concerns with  MD. Kerby Moors diet will reduce risk, but still pt may aspirate  esophageal backflow.     Treatment Recommendation  Therapy as outlined in treatment plan below    Diet Recommendation Dysphagia 3 (Mechanical Soft);Nectar-thick  liquid   Liquid Administration via: Cup;Straw Medication Administration: Crushed with puree Supervision: Patient able to self feed;Full supervision/cueing  for compensatory strategies Compensations: Slow rate;Small sips/bites;Clear throat  intermittently;Follow solids with liquid Postural Changes and/or Swallow Maneuvers: Seated upright 90  degrees;Upright 30-60 min after meal;Out of bed for meals    Other  Recommendations Oral Care Recommendations: Oral care BID Other Recommendations: Order thickener from pharmacy   Follow Up Recommendations  Inpatient Rehab    Frequency and Duration min 2x/week  2 weeks   Pertinent Vitals/Pain NA    SLP Swallow Goals     General HPI: 73 year old male with HTN and DM presented 5/27 to  ED c/o N/V since 5/25. In ED found to be in acute renal failure,  hyperkalemic, hypotensive with SBP's in 70s. Started on BiPAP for  hypoxia and increased WOB. Intubated from 5/28 to 6/1. Pt with  ICU delirium. Admission Chest CXR says New confluent bibasilar  pulmonary opacity, nonspecific but consider bilateral pneumonia  or aspiration.  Type of Study: Modified Barium Swallowing Study Reason for Referral: Objectively evaluate swallowing function Previous  Swallow Assessment: MBS 01/21/14 - NPO Diet Prior to this Study: NPO;Panda Temperature Spikes Noted: No Respiratory Status: Room air History of Recent Intubation: Yes Length of Intubations (days): 5 days Date extubated: 01/18/14 Behavior/Cognition: Alert;Cooperative;Requires  cueing;Distractible Oral Cavity - Dentition: Dentures, top;Dentures, bottom Oral Motor / Sensory Function: Impaired - see Bedside swallow  eval Self-Feeding Abilities: Able to feed self Patient Positioning: Upright in chair Baseline Vocal Quality: Clear Volitional Cough: Strong Volitional Swallow: Able to elicit Anatomy: Other (Comment) (Protrusion at c5/6, prominent CP) Pharyngeal Secretions: Not observed secondary MBS    Reason for Referral Objectively evaluate swallowing function   Oral Phase Oral Preparation/Oral Phase Oral Phase: WFL   Pharyngeal Phase Pharyngeal Phase Pharyngeal Phase: Impaired Pharyngeal - Honey Pharyngeal - Honey Teaspoon: Reduced epiglottic inversion;Reduced  anterior laryngeal mobility;Reduced laryngeal  elevation;Pharyngeal residue - valleculae;Pharyngeal residue - cp  segment Penetration/Aspiration details (honey teaspoon): Material does  not enter airway Pharyngeal - Nectar Pharyngeal - Nectar Teaspoon: Reduced epiglottic  inversion;Reduced anterior laryngeal mobility;Reduced laryngeal  elevation;Pharyngeal residue - valleculae;Pharyngeal residue - cp  segment Penetration/Aspiration details (nectar teaspoon): Material does  not enter airway Pharyngeal - Nectar Cup: Reduced epiglottic inversion;Reduced  anterior laryngeal mobility;Reduced laryngeal  elevation;Pharyngeal residue - valleculae;Pharyngeal residue - cp  segment;Penetration/Aspiration after swallow Penetration/Aspiration details (nectar cup): Material enters  airway, remains ABOVE vocal cords and not ejected out;Material  does not enter airway Pharyngeal - Nectar Straw: Reduced epiglottic inversion;Reduced  anterior laryngeal mobility;Reduced laryngeal   elevation;Pharyngeal residue - valleculae;Pharyngeal residue - cp  segment;Penetration/Aspiration after swallow Penetration/Aspiration details (nectar straw): Material enters  airway, remains ABOVE vocal cords and not ejected out;Material  does not enter airway Pharyngeal - Thin Pharyngeal - Thin Teaspoon: Not tested Pharyngeal - Thin Cup: Reduced epiglottic inversion;Reduced  anterior laryngeal mobility;Reduced laryngeal  elevation;Pharyngeal residue - valleculae;Pharyngeal residue - cp  segment;Penetration/Aspiration after swallow;Trace aspiration Penetration/Aspiration details (thin cup): Material enters  airway, passes BELOW cords without attempt by patient  to eject  out (silent aspiration);Material enters airway, CONTACTS cords  and not ejected out Pharyngeal - Thin Straw: Not tested Pharyngeal - Solids Pharyngeal - Puree: Reduced epiglottic inversion;Reduced anterior  laryngeal mobility;Reduced laryngeal elevation;Pharyngeal residue  - valleculae;Pharyngeal residue - cp segment Penetration/Aspiration details (puree): Material does not enter  airway Pharyngeal - Regular: Reduced epiglottic inversion;Reduced  anterior laryngeal mobility;Reduced laryngeal  elevation;Pharyngeal residue - valleculae;Pharyngeal residue - cp  segment Pharyngeal - Pill: Not tested  Cervical Esophageal Phase    GO    Cervical Esophageal Phase Cervical Esophageal Phase: Impaired Cervical Esophageal Phase - Comment Cervical Esophageal Comment: see impression statement        Herbie Baltimore, MA CCC-SLP [phone]  Katherene Ponto Deblois 01/26/2014, 12:48 PM    Dg Swallowing Func-speech Pathology  01/21/2014   Katherene Ponto Deblois, CCC-SLP     01/21/2014 10:58 AM Objective Swallowing Evaluation: Modified Barium Swallowing Study   Patient Details  Name: SAMIER JACO MRN: [id number] Date of Birth: October 18, 1940  Today's Date: 01/21/2014 Time: 0930-1000 SLP Time Calculation (min): 30 min  Past Medical History:  Past Medical History  Diagnosis Date   . Diabetes mellitus without complication   . Hypertension   . Hypercholesteremia    Past Surgical History:  Past Surgical History  Procedure Laterality Date  . Prostatectomy    . Colon resection     HPI:  73 year old male with HTN and DM presented 5/27 to ED c/o N/V  since 5/25. In ED found to be in acute renal failure,  hyperkalemic, hypotensive with SBP's in 70s. Started on BiPAP for  hypoxia and increased WOB. Intubated from 5/28 to 6/1. Pt with  ICU delirium. Admission Chest CXR says New confluent bibasilar  pulmonary opacity, nonspecific but consider bilateral pneumonia  or aspiration.      Assessment / Plan / Recommendation Clinical Impression  Dysphagia Diagnosis: Moderate cervical esophageal phase  dysphagia;Severe pharyngeal phase dysphagia;Mild oral phase  dysphagia Clinical impression: Pt demonstrates a primary structural  cervical esophageal dysphagia, present at baseline, that is  likely worsed by pts mental status and generalized weakness. Pt  was awake, but required max cues to keep head up, bring cup to  lips and orally accept POs. Oral phase characterized by slow  transit. Pt noted to have the appearance of bony protrusion at  C5/6 and a prominent cricopharyngeus (no radiologist present to  confirm) as well as reduced laryngeal elevation and hyoid  excursion. These deficits result in incomplete airway closure  with silent aspiration during the swallow and residuals over the  CP segment and in the valleculae that are repentrated after the  swallow even if pt cued to clear his throat. Findings consistent  with possible admission with aspiration pna. Pt would be at high  risk of aspiration at this time and would benefit from several  days of recovery prior to retesting and attempting POs. Improved  mental status and general strength would be indicators for  retest. Recommend NPO with short term alternate nutrition. SLP  will revisit pt on Monday for possiblity of retest.     Treatment Recommendation   Therapy as outlined in treatment plan below    Diet Recommendation NPO;Alternative means - temporary   Medication Administration: Via alternative means    Other  Recommendations Oral Care Recommendations: Oral care Q4  per protocol   Follow Up Recommendations  Skilled Nursing facility    Frequency and Duration min 3x week  2 weeks   Pertinent Vitals/Pain  NA    SLP Swallow Goals     General HPI: 73 year old male with HTN and DM presented 5/27 to  ED c/o N/V since 5/25. In ED found to be in acute renal failure,  hyperkalemic, hypotensive with SBP's in 70s. Started on BiPAP for  hypoxia and increased WOB. Intubated from 5/28 to 6/1. Pt with  ICU delirium. Admission Chest CXR says New confluent bibasilar  pulmonary opacity, nonspecific but consider bilateral pneumonia  or aspiration.  Type of Study: Modified Barium Swallowing Study Reason for Referral: Objectively evaluate swallowing function Previous Swallow Assessment: none in chart Diet Prior to this Study: NPO Temperature Spikes Noted: No Respiratory Status: Nasal cannula History of Recent Intubation: Yes Length of Intubations (days): 5 days Date extubated: 01/18/14 Behavior/Cognition: Alert;Cooperative;Requires  cueing;Distractible Oral Cavity - Dentition: Dentures, top;Dentures, bottom Oral Motor / Sensory Function: Impaired - see Bedside swallow  eval Self-Feeding Abilities: Able to feed self;Needs assist Patient Positioning: Upright in chair Baseline Vocal Quality: Low vocal intensity;Wet Volitional Cough: Strong Volitional Swallow: Able to elicit Anatomy:  (bony protrustion at C5/6, Prominent CP, no radiologist  pres.) Pharyngeal Secretions:  (suspect standing secretions)    Reason for Referral Objectively evaluate swallowing function   Oral Phase Oral Preparation/Oral Phase Oral Phase: Impaired Oral - Honey Oral - Honey Teaspoon: Delayed oral transit Oral - Nectar Oral - Nectar Teaspoon: Delayed oral transit Oral - Nectar Cup: Delayed oral transit Oral - Thin  Oral - Thin Teaspoon: Right anterior bolus loss;Left anterior  bolus loss;Delayed oral transit Oral - Thin Straw: Delayed oral transit (pt struggles to tip head  and cup back for thin cup) Oral - Solids Oral - Puree: Within functional limits   Pharyngeal Phase Pharyngeal Phase Pharyngeal Phase: Impaired Pharyngeal - Honey Pharyngeal - Honey Teaspoon: Reduced anterior laryngeal  mobility;Reduced epiglottic inversion;Reduced laryngeal  elevation;Reduced airway/laryngeal closure;Reduced tongue base  retraction;Penetration/Aspiration after  swallow;Penetration/Aspiration during swallow;Trace  aspiration;Pharyngeal residue - valleculae;Pharyngeal residue -  cp segment;Pharyngeal residue - pyriform sinuses;Compensatory  strategies attempted (Comment) (head turn left - not effective) Penetration/Aspiration details (honey teaspoon): Material enters  airway, CONTACTS cords and not ejected out;Material enters  airway, passes BELOW cords without attempt by patient to eject  out (silent aspiration) Pharyngeal - Nectar Pharyngeal - Nectar Teaspoon: Reduced anterior laryngeal  mobility;Reduced epiglottic inversion;Reduced laryngeal  elevation;Reduced airway/laryngeal closure;Reduced tongue base  retraction;Penetration/Aspiration after  swallow;Penetration/Aspiration during swallow;Trace  aspiration;Pharyngeal residue - valleculae;Pharyngeal residue -  cp segment;Pharyngeal residue - pyriform sinuses;Compensatory  strategies attempted (Comment) Penetration/Aspiration details (nectar teaspoon): Material enters  airway, CONTACTS cords and not ejected out;Material enters  airway, passes BELOW cords without attempt by patient to eject  out (silent aspiration) Pharyngeal - Nectar Cup: Reduced anterior laryngeal  mobility;Reduced epiglottic inversion;Reduced laryngeal  elevation;Reduced airway/laryngeal closure;Reduced tongue base  retraction;Penetration/Aspiration after  swallow;Penetration/Aspiration during swallow;Trace   aspiration;Pharyngeal residue - valleculae;Pharyngeal residue -  cp segment;Pharyngeal residue - pyriform sinuses;Compensatory  strategies attempted (Comment) Penetration/Aspiration details (nectar cup): Material enters  airway, CONTACTS cords and not ejected out;Material enters  airway, passes BELOW cords without attempt by patient to eject  out (silent aspiration) Pharyngeal - Thin Pharyngeal - Thin Teaspoon: Not tested (all spilled from mouth) Pharyngeal - Thin Straw: Reduced anterior laryngeal  mobility;Reduced epiglottic inversion;Reduced laryngeal  elevation;Reduced airway/laryngeal closure;Reduced tongue base  retraction;Penetration/Aspiration during swallow;Pharyngeal  residue - valleculae;Pharyngeal residue - cp segment;Pharyngeal  residue - pyriform sinuses;Compensatory strategies attempted  (Comment);Moderate aspiration Penetration/Aspiration details (thin straw): Material enters  airway, passes BELOW cords without attempt by  patient to eject  out (silent aspiration) Pharyngeal - Solids Pharyngeal - Puree: Reduced anterior laryngeal mobility;Reduced  epiglottic inversion;Reduced laryngeal elevation;Reduced  airway/laryngeal closure;Reduced tongue base  retraction;Pharyngeal residue - valleculae;Pharyngeal residue -  cp segment;Pharyngeal residue - pyriform sinuses;Compensatory  strategies attempted (Comment) Penetration/Aspiration details (puree): Material does not enter  airway  Cervical Esophageal Phase    GO    Cervical Esophageal Phase Cervical Esophageal Phase: Impaired Cervical Esophageal Phase - Comment Cervical Esophageal Comment: see impression statement        Herbie Baltimore, MA CCC-SLP [phone]  Katherene Ponto Deblois 01/21/2014, 10:56 AM     Medications: Scheduled Meds: . antiseptic oral rinse  15 mL Mouth Rinse BID  . aspirin  81 mg Oral Daily  . carvedilol  3.125 mg Oral BID WC  . darbepoetin (ARANESP) injection - NON-DIALYSIS  100 mcg Subcutaneous Q Mon-1800  . feeding supplement  (ENSURE)  1 Container Oral BID BM  . insulin aspart  0-24 Units Subcutaneous TID WC  . pantoprazole  40 mg Oral QAC breakfast      LOS: 52 days   RAI,RIPUDEEP M.D. [facility] 02/04/2014, 2:09 PM Pager: [phone]  If 7PM-7AM, please contact night-coverage [email or web address] Password [email or web address]  **Disclaimer: This note was dictated with voice recognition software. Similar sounding words can inadvertently be transcribed and this note may contain transcription errors which may not have been corrected upon publication of note.**

## 2014-02-04 NOTE — Progress Notes (Signed)
Pt VSS.  Denies pain. Pt to go to dialysis today.

## 2014-02-04 NOTE — Progress Notes (Signed)
Background: 73 year old male with past medical history of hypertension, diabetes who presented to the emergency room on 5/27 with complaints of nausea and vomiting x2 days. Found to be in severe acute renal failure with a creatinine of 9.36, potassium greater than 7.7 and hypotensive. Patient noted to be hypoxic, likely from aspiration pneumonia from vomiting with volume overload and was started on BiPAP. He was admitted to the critical care service. Patient was started on pressor support, CVVHD and was intubated.CRRT stopped 6/2 and transitioned to IHD.  He is currently making urine. Dialysis catheter accidentally removed by patient on 6/11 early morning. Catheter replaced on 6/14 by interventional radiology. Once it is determined whether patient will need dialysis long-term, he will go to skilled nursing facility. UNCLEAR at this time if will be ESRD or not (baseline creatinine not known - was 1.29-1.49 back in 2009).  Assessment /Plans 1. AKI 2/2 ATN related to NSAID, ACEi, hypovolemia, PNA, Septic shock, CRRT 5/28-6/2, now getting prn HD. hx of stage 3 CKD back in 2009.  Too early to declare ESRD.  Creatinine rising in interdialytic interval (despite some UOP, no return of GFR).  HD today 2. Anemia - s/p PRBC's with HD 6/14; tsat low but ferritin of 2000 precludes use of Fe with HD; added darbe 100/week (6/15) 3. Hyperkalemia - resolved 4. HTN 5. DM 6. S/p PNA (aspiration- completed ATB's 7. Diastolic CHF 8. Atrial flutter - occurred on dialysis 6/16; has not had this issue prior. Responded to IV lopressor.  9. S/p NTEMI - demand ischemia in setting of septic shock on adm  Subjective: Interval History: No more aflutter Appetite better Still no return of GFR For HD today  Objective: Vital signs in last 24 hours: Temp:  [97.8 F (36.6 C)-99 F (37.2 C)] 97.8 F (36.6 C) (06/18 1009) Pulse Rate:  [77-87] 87 (06/18 1009) BP: (121-154)/(75-86) 140/86 mmHg (06/18 1009) SpO2:  [94 %-98 %]  95 % (06/18 1009) Weight:  [81 kg (178 lb 9.2 oz)] 81 kg (178 lb 9.2 oz) (06/17 2151) Weight change: -1.7 kg (-3 lb 12 oz)  Intake/Output from previous day: 06/17 0701 - 06/18 0700 In: 720 [P.O.:720] Out: 400 [Urine:400] Intake/Output this shift: Total I/O In: 120 [P.O.:120] Out: 300 [Urine:300] BP 140/86  Pulse 87  Temp(Src) 97.8 F (36.6 C) (Oral)  Resp 18  Ht 5\' 11"  (1.803 m)  Wt 81 kg (178 lb 9.2 oz)  BMI 24.92 kg/m2  SpO2 95%  Physical examination VS as noted Smiling, says feels pretty good Lungs grossly clear TDC dressing intact Abd soft and nontender No edema of the LE's   Recent Labs  02/03/14 1135 02/04/14 0541  WBC 8.5 10.1  HGB 9.1* 9.3*  HCT 28.5* 29.5*  PLT 235 249    Recent Labs  02/03/14 1135 02/04/14 0545  NA 138 140  K 4.1 3.9  CL 95* 95*  CO2 25 25  GLUCOSE 184* 104*  BUN 51* 66*  CREATININE 4.80* 5.84*  CALCIUM 8.1* 8.0*   Results for HILDING, QUINTANAR (MRN [id number]) as of 02/02/2014 12:43  Ref. Range 02/02/2014 05:07  Iron Latest Range: 42-135 ug/dL 29 (L)  UIBC Latest Range: 125-400 ug/dL 164  TIBC Latest Range: 215-435 ug/dL 193 (L)  Saturation Ratios Latest Range: 20-55 % 15 (L)  Ferritin Latest Range: 22-322 ng/mL 2065 (H)   Studies/Results: No results found.  Scheduled: . antiseptic oral rinse  15 mL Mouth Rinse BID  . aspirin  81 mg Oral Daily  .  carvedilol  3.125 mg Oral BID WC  . darbepoetin (ARANESP) injection - NON-DIALYSIS  100 mcg Subcutaneous Q Mon-1800  . feeding supplement (ENSURE)  1 Container Oral BID BM  . insulin aspart  0-24 Units Subcutaneous TID WC  . pantoprazole  40 mg Oral QAC breakfast    LOS: 22 days   DUNHAM,CYNTHIA B 02/04/2014,1:15 PM

## 2014-02-04 NOTE — Progress Notes (Signed)
Dr. Tana Coast returned page advised pt in dialysis at this time.  Dr. Tana Coast stated if needed they can call from dialysis.

## 2014-02-04 NOTE — Progress Notes (Signed)
Paged Dr. Tana Coast pt bp 182/122, FYI

## 2014-02-05 LAB — GLUCOSE, CAPILLARY
GLUCOSE-CAPILLARY: 154 mg/dL — AB (ref 70–99)
GLUCOSE-CAPILLARY: 132 mg/dL — AB (ref 70–99)
Glucose-Capillary: 107 mg/dL — ABNORMAL HIGH (ref 70–99)

## 2014-02-05 LAB — RENAL FUNCTION PANEL
Albumin: 2.2 g/dL — ABNORMAL LOW (ref 3.5–5.2)
BUN: 24 mg/dL — ABNORMAL HIGH (ref 6–23)
CALCIUM: 8.1 mg/dL — AB (ref 8.4–10.5)
CHLORIDE: 96 meq/L (ref 96–112)
CO2: 27 mEq/L (ref 19–32)
Creatinine, Ser: 3.19 mg/dL — ABNORMAL HIGH (ref 0.50–1.35)
GFR calc Af Amer: 21 mL/min — ABNORMAL LOW (ref >90)
GFR, EST NON AFRICAN AMERICAN: 18 mL/min — AB (ref >90)
Glucose, Bld: 105 mg/dL — ABNORMAL HIGH (ref 70–99)
Phosphorus: 4.2 mg/dL (ref 2.3–4.6)
Potassium: 3.7 mEq/L (ref 3.7–5.3)
Sodium: 137 mEq/L (ref 137–147)

## 2014-02-05 NOTE — Progress Notes (Addendum)
Background: 73 year old male with past medical history of hypertension, diabetes who presented to the emergency room on 5/27 with complaints of nausea and vomiting x2 days. Found to be in severe acute renal failure with a creatinine of 9.36, potassium greater than 7.7 and hypotensive. Patient noted to be hypoxic, likely from aspiration pneumonia from vomiting with volume overload and was started on BiPAP. He was admitted to the critical care service. Patient was started on pressor support, CVVHD and was intubated.CRRT stopped 6/2 and transitioned to IHD.  He is currently making urine. Dialysis catheter accidentally removed by patient on 6/11 early morning. Catheter (right TDC) replaced  6/14 by interventional radiology. Once it is determined whether patient will need dialysis long-term, he will go to skilled nursing facility. Baseline creatinine done in Dr. Claris Gower' office 01/08/14 was 1.05 and 11/2011 was 1.12 so I am optimistic for recovery)  Assessment /Plans 1. AKI 2/2 ATN related to NSAID, ACEi, hypovolemia, PNA, Septic shock, CRRT 5/28-6/2, now getting prn HD. Found out from Dr. Arelia Sneddon' office today creatinine was 1.05 in May of this year so that makes me much more optimistic that he will recover. Creatinine rising in interdialytic interval (despite some UOP, no return of GFR).  HD TTS pending recovery 2. Anemia - s/p PRBC's with HD 6/14; tsat low but ferritin of 2000 precludes use of Fe with HD; added darbe 100/week (6/15) 3. Hyperkalemia - resolved 4. HTN 5. DM 6. S/p PNA (aspiration- completed ATB's) 7. Diastolic CHF 8. Atrial flutter - occurred on dialysis 6/16; has not had this issue prior. Responded to IV lopressor.  9. S/p NTEMI - demand ischemia in setting of septic shock on adm  Subjective: Feels "OK" Up in the chair Says dialysis made him sick yesterday Still no return of GFR though over a liter of urine yesterday Next HD would be 6/20  Objective: Vital signs in last 24  hours: Temp:  [98.2 F (36.8 C)-98.3 F (36.8 C)] 98.3 F (36.8 C) (06/19 1026) Pulse Rate:  [78-90] 90 (06/19 1026) Resp:  [18-22] 18 (06/19 1026) BP: (123-182)/(74-122) 148/83 mmHg (06/19 1026) SpO2:  [94 %-97 %] 94 % (06/19 1026) Weight:  [74.999 kg (165 lb 5.5 oz)-75.4 kg (166 lb 3.6 oz)] 74.999 kg (165 lb 5.5 oz) (06/18 2254) Weight change: -5.6 kg (-12 lb 5.5 oz)  Intake/Output from previous day: 06/18 0701 - 06/19 0700 In: 600 [P.O.:600] Out: 991 [Urine:1150] Intake/Output this shift: Total I/O In: 240 [P.O.:240] Out: 200 [Urine:200] BP 148/83  Pulse 90  Temp(Src) 98.3 F (36.8 C) (Oral)  Resp 18  Ht 5\' 11"  (1.803 m)  Wt 74.999 kg (165 lb 5.5 oz)  BMI 23.07 kg/m2  SpO2 94%  Physical examination VS as noted NAD Regular S1S2 No S3 Lungs grossly clear TDC dressing intact Abd soft and nontender No edema of the LE's   Recent Labs  02/03/14 1135 02/04/14 0541  WBC 8.5 10.1  HGB 9.1* 9.3*  HCT 28.5* 29.5*  PLT 235 249    Recent Labs  02/04/14 0545 02/05/14 0810  NA 140 137  K 3.9 3.7  CL 95* 96  CO2 25 27  GLUCOSE 104* 105*  BUN 66* 24*  CREATININE 5.84* 3.19*  CALCIUM 8.0* 8.1*   Results for [name], [name] (MRN [id number]) as of 02/02/2014 12:43  Ref. Range 02/02/2014 05:07  Iron Latest Range: 42-135 ug/dL 29 (L)  UIBC Latest Range: 125-400 ug/dL 164  TIBC Latest Range: 215-435 ug/dL 193 (L)  Saturation Ratios  Latest Range: 20-55 % 15 (L)  Ferritin Latest Range: 22-322 ng/mL 2065 (H)     Scheduled: . antiseptic oral rinse  15 mL Mouth Rinse BID  . aspirin  81 mg Oral Daily  . carvedilol  3.125 mg Oral BID WC  . darbepoetin (ARANESP) injection - NON-DIALYSIS  100 mcg Subcutaneous Q Mon-1800  . insulin aspart  0-24 Units Subcutaneous TID WC  . pantoprazole  40 mg Oral QAC breakfast    LOS: 23 days   DUNHAM,CYNTHIA B 02/05/2014,1:48 PM

## 2014-02-05 NOTE — Clinical Social Work Note (Signed)
CSW continuing to monitor patient's progress and will facilitate discharge to a skilled nursing facility when medically stable. Patient's choice is [facility] and CSW will keep them informed of patient's readiness for discharge.  Gulianna Hornsby Givens, MSW, LCSW [phone]

## 2014-02-05 NOTE — Progress Notes (Signed)
Physical Therapy Treatment Patient Details Name: [name] MRN: [id number] DOB: 08/28/1940 Today's Date: 02/05/2014    History of Present Illness 73 year old male with HTN and DM presented 5/27 to ED c/o N/V since 5/25. In ED found to be in acute renal failure, hyperkalemic, hypotensive with SBP's in 70s. Started on BiPAP for hypoxia and increased WOB. intubated 5/28 and extubated 6/1 Pt currently with Hemodialysis Catheter Left side of neck    PT Comments    Pt. Appears weak and deconditioned.  Only tolerated transfers onto and off 3n1. Was able to have small BM.  Tolerated standing for several minutes for peri care.  Follow Up Recommendations  SNF;Supervision/Assistance - 24 hour     Equipment Recommendations  Rolling walker with 5" wheels;3in1 (PT)    Recommendations for Other Services       Precautions / Restrictions Precautions Precautions: Fall Restrictions Weight Bearing Restrictions: No    Mobility  Bed Mobility Overal bed mobility:  (not assessed; up in recliner)                Transfers Overall transfer level: Needs assistance Equipment used: Rolling walker (2 wheeled) Transfers: Sit to/from [facility] Sit to Stand: Mod assist Stand pivot transfers: Mod assist       General transfer comment: needs vc's to rise to stand and mod assist to power up and for stand pivot to [facility] and back to recliner.  Pt. was able to have small BM in BSC. Needed total assist for peri care/cleaning.  Assisted back to recliner onto blue air cushion.  Pt. requested to go back to bed however his bed has not been made.  I notified nursing tech and she will arrive shortly to change pt's bed when airlflow bed pads arrive  Ambulation/Gait                 Stairs            Wheelchair Mobility    Modified Rankin (Stroke Patients Only)       Balance           Standing balance support: Bilateral upper extremity supported;During functional  activity Standing balance-Leahy Scale: Poor Standing balance comment: needs external support to remain upright.                    Cognition Arousal/Alertness: Awake/alert Behavior During Therapy: WFL for tasks assessed/performed Overall Cognitive Status: Within Functional Limits for tasks assessed Area of Impairment: Memory Orientation Level: Disoriented to;Time;Situation Current Attention Level: Sustained Memory: Decreased short-term memory Following Commands: Follows one step commands consistently;Follows one step commands with increased time Safety/Judgement: Decreased awareness of deficits Awareness: Intellectual Problem Solving: Slow processing      Exercises      General Comments        Pertinent Vitals/Pain No pain, no distress    Home Living                      Prior Function            PT Goals (current goals can now be found in the care plan section) Progress towards PT goals: Progressing toward goals    Frequency  Min 3X/week    PT Plan Current plan remains appropriate    Co-evaluation             End of Session Equipment Utilized During Treatment: Gait belt Activity Tolerance: Patient limited by fatigue Patient left: in  chair;with call bell/phone within reach;Other (comment) (on air cushion)     Time: 8185-6314 PT Time Calculation (min): 23 min  Charges:  $Therapeutic Activity: 23-37 mins                    G Codes:      Ladona Ridgel 02/05/2014, 2:17 PM Gerlean Ren PT Acute Rehab Services [phone] Beeper [phone]

## 2014-02-05 NOTE — Progress Notes (Signed)
Occupational Therapy Treatment Patient Details Name: [name] MRN: [id number] DOB: Sep 13, 1940 Today's Date: 02/05/2014    History of present illness 73 year old male with HTN and DM presented 5/27 to ED c/o N/V since 5/25. In ED found to be in acute renal failure, hyperkalemic, hypotensive with SBP's in 70s. Started on BiPAP for hypoxia and increased WOB. intubated 5/28 and extubated 6/1 Pt currently with Hemodialysis Catheter Left side of neck   OT comments  Pt. Hesitant with participation today secondary to c/o nausea preventing him from feeling like he could move out of the bed.  Able to complete grooming and self feeding tasks bed level with head of bed elevated to promote seated position.    Follow Up Recommendations  SNF    Equipment Recommendations  Wheelchair (measurements OT);Wheelchair cushion (measurements OT);3 in 1 bedside comode          Precautions / Restrictions Precautions Precautions: Fall                                             ADL Overall ADL's : Needs assistance/impaired Eating/Feeding: Set up;Minimal assistance;Bed level   Grooming: Wash/dry face;Wash/dry hands;Bed level;Minimal assistance                                 General ADL Comments: c/o nausea this am preventing further participation than bed level                                                                                                    Pertinent Vitals/ Pain      C/o nausea, md was in room prior to my arrival and aware of pts. nausea                                                          Frequency Min 2X/week     Progress Toward Goals  OT Goals(current goals can now be found in the care plan section)  Progress towards OT goals: Progressing toward goals     Plan Discharge plan remains appropriate                    End of Session     Activity Tolerance  Other (comment) (limited by nausea)   Patient Left in bed;with call bell/phone within reach             Time: 7829-5621 OT Time Calculation (min): 21 min  Charges: OT General Charges $OT Visit: 1 Procedure OT Treatments $Self Care/Home Management : 8-22 mins  Janice Coffin, COTA/L 02/05/2014, 8:40 AM

## 2014-02-05 NOTE — Progress Notes (Signed)
Patient ID: [name]  male  BVQ:[id number]    DOB: 1940-09-16    DOA: 01/13/2014  PCP: No primary provider on file.  Assessment/Plan: Principal Problem:  Hypotensive/Septic shock, aspiration pneumonia: Resolved. Patient met criteria given hypotension, end organ damage and required pressor support. Source felt to be pneumonia secondary to aspiration.  - Completed antibiotic course, continue dysphagia 3 diet.   Active problems Acute renal failure now in the setting of chronic renal disease. Improving, Nephrology following. - Creatinine 3.1, HD 6/19 - Catheter replaced 6/14 - Unclear if patient is going to need permanent hemodialysis  Metabolic acidosis: Secondary to uremia. Initially from septic shock.   Hyperkalemia: Secondary renal failure. Initially treated with dialysis.  - Resolved  Thrombocytopenia: Resolved. Secondary to thrombocytopenia critical illness. Improving   Anemia: Secondary to chronic renal disease and illness.   Atrial flutter: On 6/16, Patient started developing episodes of significant tachycardia during dialysis. Rhythm assessment found to be aflutter.  - Currently stable  Diabetes mellitus:  - CBG stable. On sliding scale only. A1c 5.5.  - At this point, discontinue metformin permanently given renal failure and try to manage with dietary control only   Dysphagia: In part due to mentation. Initially at dysphagia 3 with nectar thick liquids. Upgraded by speech therapy to than likely this on 6/16. Patient has history of presbyesophagus. Speech therapy should follow even at skilled nursing facility   HTN: Slightly elevated. Started on beta blocker 6/13   Hyperlipidemia   Diastolic heart failure: Echocardiogram done in the setting of ?Non-STEMI. Grade 2 diastolic dysfunction noted. Once renal function assessed, we'll manage it with dialysis versus diuretics. Low dose Coreg initiated.   NSTEMI: Demand ischemia in the setting of septic shock. Resolved.   Toe  ulceration: Patient was scheduled by orthopedic surgery to have planned left fourth toe amputation done 6/25. Hospitalist left message for need for postponement   DVT Prophylaxis:  Code Status:  Family Communication:   Disposition: Will need skilled nursing facility  Consultants:   nephrology Critical Care  Interventional radiology  Speech therapy   Procedures:  Status post intubation on 5/28-extubated 6/1  Temporary dialysis catheter placed 5/28, patient removed it on 6/11  CVVH started 5/20  Echo 5/28 >> EF 65 to 80%, grade 2 diastolic dysfx, mod AS  Placement of dialysis catheter by interventional radiology on 6/14      Antibiotics:  Zosyn 5/27 >>> 6/03  Vancomycin 5/27 >>> 6/02  Subjective: Patient seen earlier this morning, no complaints, no fevers chills, eating breakfast  Objective: Weight change: -5.6 kg (-12 lb 5.5 oz)  Intake/Output Summary (Last 24 hours) at 02/05/14 1155 Last data filed at 02/05/14 0900  Gross per 24 hour  Intake    720 ml  Output    691 ml  Net     29 ml   Blood pressure 148/83, pulse 90, temperature 98.3 F (36.8 C), temperature source Oral, resp. rate 18, height _0  (1.803 m), weight 74.999 kg (165 lb 5.5 oz), SpO2 94.00%.  Physical Exam: General: Alert and awake, oriented, frail not in any acute distress. CVS: S1-S2 clear, 3/6 holosystolic murmur rubs or gallops Chest: CTAB Abdomen: soft nontender, nondistended, normal bowel sounds  Extremities: no cyanosis, clubbing or edema noted bilaterally   Lab Results: Basic Metabolic Panel:  Recent Labs Lab 02/04/14 0545 02/05/14 0810  NA 140 137  K 3.9 3.7  CL 95* 96  CO2 25 27  GLUCOSE 104* 105*  BUN 66* 24*  CREATININE 5.84* 3.19*  CALCIUM 8.0* 8.1*  PHOS 7.6* 4.2   Liver Function Tests:  Recent Labs Lab 02/04/14 0545 02/05/14 0810  ALBUMIN 2.3* 2.2*   No results found for this basename: LIPASE, AMYLASE,  in the last 168 hours No results found for this  basename: AMMONIA,  in the last 168 hours CBC:  Recent Labs Lab 02/03/14 1135 02/04/14 0541  WBC 8.5 10.1  HGB 9.1* 9.3*  HCT 28.5* 29.5*  MCV 88.8 89.7  PLT 235 249   Cardiac Enzymes: No results found for this basename: CKTOTAL, CKMB, CKMBINDEX, TROPONINI,  in the last 168 hours BNP: No components found with this basename: POCBNP,  CBG:  Recent Labs Lab 02/04/14 1210 02/04/14 1637 02/04/14 2250 02/05/14 0719 02/05/14 1152  GLUCAP 140* 158* 99 107* 154*     Micro Results: No results found for this or any previous visit (from the past 240 hour(s)).  Studies/Results: Dg Abd 1 View  01/23/2014   CLINICAL DATA:  Feeding tube placement.  EXAM: ABDOMEN - 1 VIEW  COMPARISON:  01/21/2014  FINDINGS: Feeding tube tip is seen with tip in the fundus of the stomach. Some residual contrast is seen within the colon. No dilated bowel loops seen.  IMPRESSION: Feeding tube tip in fundus of stomach.   Electronically Signed   By: Earle Gell M.D.   On: 01/23/2014 18:57   Dg Esophagus  01/26/2014   CLINICAL DATA:  Dysphagia. Modified swallow earlier in the morning only clearing patient for nectar thickness.  EXAM: ESOPHOGRAM/BARIUM SWALLOW  TECHNIQUE: Single contrast examination was performed using  thick barium.  FLUOROSCOPY TIME:  3 min and 20 is seconds  COMPARISON:  Chest radiograph 01/19/2014.  FINDINGS: Focused single-contrast exam was performed in a supine, mildly LPO position. The bed was maintained partially upright.  Evaluation of a single swallow demonstrates contrast stasis throughout the mid and upper thoracic esophagus.  Full column evaluation of the esophagus demonstrates no persistent narrowing or stricture.  13 mm barium tablet has initial delay in the vallecula. On subsequent swallows, the tablet passes into the esophagus and then into the stomach.  IMPRESSION: 1. Moderately degraded exam. 2. 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 3. No evidence of  stricture other  anatomic cause for dysphagia. 4. Delayed passage of the barium tablet at the level of the vallecula.   Electronically Signed   By: Abigail Miyamoto M.D.   On: 01/26/2014 16:14   US Renal  01/14/2014   CLINICAL DATA:  Renal failure, history hypertension, diabetes  EXAM: RENAL/URINARY TRACT ULTRASOUND COMPLETE  COMPARISON:  None  FINDINGS: Right Kidney:  Length: 12.4 cm length. Cortical thickness up to 1.8 cm thick. Increased cortical echogenicity. Hypoechoic nodule medial aspect mid RIGHT kidney 2.1 x 2.2 x 2.1 cm containing scattered internal echoes question complicated cyst. No additional mass, hydronephrosis or shadowing calcification.  Left Kidney:  Length: 10.5 cm length. Cortical thickness up to 2.0 cm thick. Increased cortical echogenicity. Minimally complicated cyst at mid LEFT kidney 2.7 x 3.3 x 3.7 cm containing slightly irregular wall and scattered internal echoes. No additional mass, hydronephrosis or shadowing calcification.  Bladder:  Contains only minimal urine. Minimally enlarged central lobe of prostate.  IMPRESSION: Increased renal cortical echogenicity bilaterally consistent with medical renal disease.  Mildly complicated cysts within the mid portions of both kidneys.  No evidence of hydronephrosis.   Electronically Signed   By: Lavonia Dana M.D.   On: 01/14/2014 02:31   Ir Fluoro Guide Cv  Line Right  01/31/2014   INDICATION: Renal failure and needs access for hemodialysis.  EXAM: FLUOROSCOPIC AND ULTRASOUND GUIDED PLACEMENT OF A TUNNELED DIALYSIS CATHETER  Physician: Stephan Minister. Anselm Pancoast, MD  FLUOROSCOPY TIME:  24 seconds  MEDICATIONS: 0.5 mg versed, 25 mcg fentanyl. A radiology nurse monitored the patient for moderate sedation.  ANESTHESIA/SEDATION: Moderate sedation time: 19 minutes  PROCEDURE: Informed consent was obtained for placement of a tunneled dialysis catheter. The patient was placed supine on the interventional table. Ultrasound confirmed a patent right internal jugularvein. Ultrasound images  were obtained for documentation. The right side of the neck was prepped and draped in a sterile fashion. The right side of the neck was anesthetized with 1% lidocaine. Maximal barrier sterile technique was utilized including caps, mask, sterile gowns, sterile gloves, sterile drape, hand hygiene and skin antiseptic. A small incision was made with #11 blade scalpel. A 21 gauge needle directed into the right internal jugular vein with ultrasound guidance. A micropuncture dilator set was placed. A 19 cm tip to cuff HemoSplit catheter was selected. The skin below the right clavicle was anesthetized and a small incision was made with an #11 blade scalpel. A subcutaneous tunnel was formed to the vein dermatotomy site. The catheter was brought through the tunnel. The vein dermatotomy site was dilated to accommodate a peel-away sheath. The catheter was placed through the peel-away sheath and directed into the central venous structures. The tip of the catheter was placed in the lower SVC with fluoroscopy. Fluoroscopic images were obtained for documentation. Both lumens were found to aspirate and flush well. The proper amount of heparin was flushed in both lumens. The vein dermatotomy site was closed using a single layer of absorbable suture and Dermabond. The catheter was secured to the skin using Prolene suture.  FINDINGS: Catheter tip in the lower SVC.  COMPLICATIONS: None  IMPRESSION: Successful placement of a right jugular tunneled dialysis catheter using ultrasound and fluoroscopic guidance.   Electronically Signed   By: Markus Daft M.D.   On: 01/31/2014 13:45   Ir US Guide Vasc Access Right  01/31/2014   INDICATION: Renal failure and needs access for hemodialysis.  EXAM: FLUOROSCOPIC AND ULTRASOUND GUIDED PLACEMENT OF A TUNNELED DIALYSIS CATHETER  Physician: Stephan Minister. Anselm Pancoast, MD  FLUOROSCOPY TIME:  24 seconds  MEDICATIONS: 0.5 mg versed, 25 mcg fentanyl. A radiology nurse monitored the patient for moderate sedation.   ANESTHESIA/SEDATION: Moderate sedation time: 19 minutes  PROCEDURE: Informed consent was obtained for placement of a tunneled dialysis catheter. The patient was placed supine on the interventional table. Ultrasound confirmed a patent right internal jugularvein. Ultrasound images were obtained for documentation. The right side of the neck was prepped and draped in a sterile fashion. The right side of the neck was anesthetized with 1% lidocaine. Maximal barrier sterile technique was utilized including caps, mask, sterile gowns, sterile gloves, sterile drape, hand hygiene and skin antiseptic. A small incision was made with #11 blade scalpel. A 21 gauge needle directed into the right internal jugular vein with ultrasound guidance. A micropuncture dilator set was placed. A 19 cm tip to cuff HemoSplit catheter was selected. The skin below the right clavicle was anesthetized and a small incision was made with an #11 blade scalpel. A subcutaneous tunnel was formed to the vein dermatotomy site. The catheter was brought through the tunnel. The vein dermatotomy site was dilated to accommodate a peel-away sheath. The catheter was placed through the peel-away sheath and directed into the central venous structures.  The tip of the catheter was placed in the lower SVC with fluoroscopy. Fluoroscopic images were obtained for documentation. Both lumens were found to aspirate and flush well. The proper amount of heparin was flushed in both lumens. The vein dermatotomy site was closed using a single layer of absorbable suture and Dermabond. The catheter was secured to the skin using Prolene suture.  FINDINGS: Catheter tip in the lower SVC.  COMPLICATIONS: None  IMPRESSION: Successful placement of a right jugular tunneled dialysis catheter using ultrasound and fluoroscopic guidance.   Electronically Signed   By: Markus Daft M.D.   On: 01/31/2014 13:45   Dg Chest Port 1 View  01/19/2014   CLINICAL DATA:  Cough  EXAM: PORTABLE CHEST - 1  VIEW  COMPARISON:  01/18/2014  FINDINGS: Cardiac shadow is stable. A right jugular line and left jugular temporary dialysis catheter are again seen and stable. The endotracheal tube and nasogastric catheter have been removed. The lungs are well aerated bilaterally. Improved aeration is again seen bilaterally with persistent changes in the right lung base. No new focal infiltrate is seen.  IMPRESSION: Improved aeration with some persistent changes in the right base.  Tubes and lines as described.   Electronically Signed   By: Inez Catalina M.D.   On: 01/19/2014 06:53   Dg Chest Port 1 View  01/18/2014   CLINICAL DATA:  Check endotracheal tube position.  EXAM: PORTABLE CHEST - 1 VIEW  COMPARISON:  01/16/2014.  FINDINGS: Endotracheal tube terminates approximately 3.1 cm above the carina. Nasogastric tube is followed into the stomach. Right IJ central line tip projects over the low SVC. Left IJ catheter tip projects over the brachiocephalic vein confluence or upper SVC. Heart is enlarged, stable. Thoracic aorta is calcified. There is mild-to-moderate diffuse bilateral airspace disease with slight improvement in aeration at the right lung base. No definite pleural fluid.  IMPRESSION: Mild to moderate diffuse bilateral airspace disease, likely due to pulmonary edema. Slight improvement in aeration at the right lung base.   Electronically Signed   By: Lorin Picket M.D.   On: 01/18/2014 07:40   Dg Chest Port 1 View  01/16/2014   CLINICAL DATA:  Aspiration pneumonia  EXAM: PORTABLE CHEST - 1 VIEW  COMPARISON:  01/15/2014  FINDINGS: Endotracheal tube ends in the mid thoracic trachea. Unchanged positioning of bilateral IJ catheters, tips at the level of the SVC. Enteric tube enters the stomach.  Bibasilar airspace disease shows no progression or clearing. No evidence of increasing pleural fluid or air leak.  IMPRESSION: 1. Stable positioning of tubes and lines. 2. Unchanged bibasilar pneumonia.   Electronically Signed    By: Jorje Guild M.D.   On: 01/16/2014 07:18   Dg Chest Port 1 View  01/15/2014   CLINICAL DATA:  Ventilator support.  Pneumonia.  EXAM: PORTABLE CHEST - 1 VIEW  COMPARISON:  01/14/2014  FINDINGS: Endotracheal tube has its tip 4 cm above the carina. Nasogastric tube enters the stomach. Right internal jugular central line has its tip in the SVC above the right atrium. Left internal jugular central line tips are in the SVC above the right atrium. Bilateral lower lobe airspace filling consistent with pneumonia persists, probably slightly improved since yesterday. The upper lungs remain clear. No effusions.  IMPRESSION: Lines and tubes well positioned. Slight improvement in bilateral lower lobe pneumonia.   Electronically Signed   By: Nelson Chimes M.D.   On: 01/15/2014 07:35   Dg Chest Port 1 View  01/14/2014  CLINICAL DATA:  Status post central line placement  EXAM: PORTABLE CHEST - 1 VIEW  COMPARISON:  01/14/2014 225 hr  FINDINGS: A left jugular dual-lumen central line is again identified and stable. A new right jugular central line is seen with the catheter tip at the cavoatrial junction. No pneumothorax is noted. The endotracheal tube is seen 5 cm above the carina. A nasogastric catheter is noted extending into the stomach. Bilateral lower lung infiltrates are seen. The overall appearance is stable. No new focal abnormality is noted.  IMPRESSION: Stable bilateral infiltrates.  New tubes and lines as described above without complicating factors.   Electronically Signed   By: Inez Catalina M.D.   On: 01/14/2014 09:33   Dg Chest Port 1 View  01/14/2014   CLINICAL DATA:  Hemodialysis catheter placement  EXAM: Portable exam 0225 hr compared to 01/13/2014  COMPARISON:  None.  FINDINGS: LEFT jugular dual-lumen central venous catheter with tip projecting over mid SVC.  Enlargement of cardiac silhouette with pulmonary vascular congestion.  Perihilar and basilar infiltrates which could represent edema or  infection.  No pneumothorax.  Bones unremarkable.  IMPRESSION: No pneumothorax following LEFT jugular line placement.  Enlargement of cardiac silhouette with pulmonary vascular congestion and diffuse increased pulmonary infiltrates, question edema versus infection.   Electronically Signed   By: Lavonia Dana M.D.   On: 01/14/2014 02:41   Dg Chest Port 1 View  01/13/2014   CLINICAL DATA:  73 year old male with back and knee pain. Shortness of Breath. Initial encounter.  EXAM: PORTABLE CHEST - 1 VIEW  COMPARISON:  11/21/2007.  FINDINGS: Portable AP supine view at 2211 hrs. New confluent bibasilar pulmonary opacity. No pneumothorax. Pulmonary vascularity within normal limits. Stable cardiac size and mediastinal contours. Visualized tracheal air column is within normal limits.  IMPRESSION: New confluent bibasilar pulmonary opacity, nonspecific but consider bilateral pneumonia or aspiration.   Electronically Signed   By: Lars Pinks M.D.   On: 01/13/2014 23:02   Dg Abd Portable 1v  01/23/2014   CLINICAL DATA:  Feeding tube placement.  EXAM: PORTABLE ABDOMEN - 1 VIEW  COMPARISON:  01/23/2014  FINDINGS: The Dobbhoff feeding tube is looped back on itself in the descending duodenum. The tip is in the antropyloric region of the stomach.  IMPRESSION: Dobbhoff feeding tube is looped back on itself in the descending duodenum.   Electronically Signed   By: Kalman Jewels M.D.   On: 01/23/2014 23:44   Dg Abd Portable 1v  01/21/2014   CLINICAL DATA:  Feeding tube placement.  EXAM: PORTABLE ABDOMEN - 1 VIEW  COMPARISON:  None.  FINDINGS: Weighted tip feeding tube is present in the proximal gastric fundus. Barium is present in small bowel. The bowel gas pattern is nonobstructive. Gaseous distention of the colon.  IMPRESSION: Weighted tip feeding tube in the proximal gastric fundus.   Electronically Signed   By: Dereck Ligas M.D.   On: 01/21/2014 16:32   Dg Swallowing Func-speech Pathology  02/02/2014   Katherene Ponto  Long, CCC-SLP     02/02/2014 10:53 AM Objective Swallowing Evaluation: Modified Barium Swallowing Study   Patient Details  Name: [name] MRN: [id number] Date of Birth: 07-16-1941  Today's Date: 02/02/2014 Time: 1000-1030 SLP Time Calculation (min): 30 min  Past Medical History:  Past Medical History  Diagnosis Date  . Diabetes mellitus without complication   . Hypertension   . Hypercholesteremia    Past Surgical History:  Past Surgical History  Procedure Laterality Date  .  Prostatectomy    . Colon resection     HPI:  73 year old male with HTN and DM presented 5/27 to ED c/o N/V  since 5/25. In ED found to be in acute renal failure,  hyperkalemic, hypotensive with SBP's in 70s. Started on BiPAP for  hypoxia and increased WOB. Intubated from 5/28 to 6/1. Pt with  ICU delirium. Admission Chest CXR says New confluent bibasilar  pulmonary opacity, nonspecific but consider bilateral pneumonia  or aspiration. Pt has undergone multiple MBS studies with  yesterday's study showing improved swallow function to allow po  intake with strict precautions.       Assessment / Plan / Recommendation Clinical Impression  Dysphagia Diagnosis: Moderate pharyngeal phase  dysphagia;Moderate cervical esophageal phase dysphagia Clinical impression: Pt demonstrates swallow function that is  likely his baseline. Strength has continued to improve with  minimal oropharygneal residuals even with solid textures. 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 Pt  consistently has trace silent penetration to the cords with small  to normal sips of thin liquids. He had aspiration when trying to  swallow too quickly or tilting his head dramatically back. If pt  clears his throat every 2-3 sips he can expel trace penetrates.  Again this function appears to be pts baseline. Recommend a dys 3  (mechanical soft) diet with thin liquids and an intermittent  throat clear  with ongoing therapy to address compensatory  strategies.     Treatment Recommendation  Therapy as outlined in treatment plan below    Diet Recommendation Dysphagia 3 (Mechanical Soft);Thin liquid   Liquid Administration via: Cup;No straw Medication Administration: Whole meds with puree Supervision: Patient able to self feed;Full supervision/cueing  for compensatory strategies Compensations: Slow rate;Small sips/bites;Clear throat  intermittently;Follow solids with liquid Postural Changes and/or Swallow Maneuvers: Seated upright 90  degrees;Upright 30-60 min after meal;Out of bed for meals    Other  Recommendations Oral Care Recommendations: Oral care BID   Follow Up Recommendations  Inpatient Rehab    Frequency and Duration min 2x/week  2 weeks   Pertinent Vitals/Pain NA    SLP Swallow Goals     General HPI: 73 year old male with HTN and DM presented 5/27 to  ED c/o N/V since 5/25. In ED found to be in acute renal failure,  hyperkalemic, hypotensive with SBP's in 70s. Started on BiPAP for  hypoxia and increased WOB. Intubated from 5/28 to 6/1. Pt with  ICU delirium. Admission Chest CXR says New confluent bibasilar  pulmonary opacity, nonspecific but consider bilateral pneumonia  or aspiration. Pt has undergone multiple MBS studies with  yesterday's study showing improved swallow function to allow po  intake with strict precautions.   Type of Study: Modified Barium Swallowing Study Reason for Referral: Objectively evaluate swallowing function Diet Prior to this Study: Dysphagia 3 (soft);Nectar-thick liquids Temperature Spikes Noted: No Respiratory Status: Room air History of Recent Intubation: Yes Length of Intubations (days): 5 days Date extubated: 01/18/14 Behavior/Cognition: Alert;Cooperative;Pleasant mood Oral Cavity - Dentition: Dentures, top;Dentures, bottom Oral Motor / Sensory Function: Within functional limits Self-Feeding Abilities: Able to feed self Patient Positioning: Upright in chair Baseline Vocal  Quality: Clear Volitional Cough: Strong Volitional Swallow: Able to elicit Anatomy: Other (Comment) (bony protrustion at C5/6, Prominent CP,  no radiologist pres) Pharyngeal Secretions: Not observed secondary MBS    Reason for Referral Objectively evaluate swallowing function   Oral Phase Oral Preparation/Oral Phase Oral  Phase: WFL Oral - Honey Oral - Honey Teaspoon: Not tested Oral - Nectar Oral - Nectar Teaspoon: Not tested Oral - Nectar Cup: Not tested Oral - Thin Oral - Thin Teaspoon: Not tested Oral - Thin Straw: Within functional limits (with a chin tuck) Oral - Solids Oral - Puree: Within functional limits   Pharyngeal Phase Pharyngeal Phase Pharyngeal Phase: Impaired Pharyngeal - Honey Pharyngeal - Honey Teaspoon: Not tested Pharyngeal - Nectar Pharyngeal - Nectar Teaspoon: Not tested Pharyngeal - Nectar Cup: Not tested Pharyngeal - Nectar Straw: Not tested Pharyngeal - Thin Pharyngeal - Thin Teaspoon: Not tested Pharyngeal - Thin Cup: Delayed swallow initiation;Reduced  laryngeal elevation;Reduced airway/laryngeal  closure;Penetration/Aspiration before swallow;Trace aspiration Penetration/Aspiration details (thin cup): Material enters  airway, CONTACTS cords and not ejected out;Material enters  airway, passes BELOW cords without attempt by patient to eject  out (silent aspiration) Pharyngeal - Thin Straw: Delayed swallow initiation;Reduced  laryngeal elevation;Reduced airway/laryngeal  closure;Penetration/Aspiration before swallow;Trace aspiration Penetration/Aspiration details (thin straw): Material enters  airway, CONTACTS cords and not ejected out Pharyngeal - Solids Pharyngeal - Puree: Pharyngeal residue - valleculae;Reduced  laryngeal elevation;Reduced epiglottic inversion Penetration/Aspiration details (puree): Material does not enter  airway Pharyngeal - Regular: Pharyngeal residue - valleculae;Reduced  laryngeal elevation;Reduced epiglottic inversion  Cervical Esophageal Phase    GO    Cervical  Esophageal Phase Cervical Esophageal Phase: Impaired Cervical Esophageal Phase - Comment Cervical Esophageal Comment: see impression statement        Timothy Ditty, MA CCC-SLP [phone]  Claudine Mouton 02/02/2014, 10:51 AM    Dg Swallowing Func-speech Pathology  01/26/2014   [name], CCC-SLP     01/26/2014 12:49 PM Objective Swallowing Evaluation: Modified Barium Swallowing Study   Patient Details  Name: [name] MRN: [id number] Date of Birth: 04-14-1941  Today's Date: 01/26/2014 Time: 3012-3799 SLP Time Calculation (min): 19 min  Past Medical History:  Past Medical History  Diagnosis Date  . Diabetes mellitus without complication   . Hypertension   . Hypercholesteremia    Past Surgical History:  Past Surgical History  Procedure Laterality Date  . Prostatectomy    . Colon resection     HPI:  73 year old male with HTN and DM presented 5/27 to ED c/o N/V  since 5/25. In ED found to be in acute renal failure,  hyperkalemic, hypotensive with SBP's in 70s. Started on BiPAP for  hypoxia and increased WOB. Intubated from 5/28 to 6/1. Pt with  ICU delirium. Admission Chest CXR says New confluent bibasilar  pulmonary opacity, nonspecific but consider bilateral pneumonia  or aspiration.      Assessment / Plan / Recommendation Clinical Impression  Dysphagia Diagnosis: Mild cervical esophageal phase  dysphagia;Suspected primary esophageal dysphagia;Mild pharyngeal  phase dysphagia Clinical impression: Overall strength and timeliness of swallow  has significantly improved though mild to moderate dyspahgia  persists. Reduced hyoid excursion and base of tongue retraction  leads to mild to moderate vallecular residuals that, with thin  liquids, are silently penetrated/aspirated after the swallow.  Quantity and severity of penetration with nectar thick liquids is  trace and is easily cleared with occasional throat clear. The  primary problem is likely more related to cervical esophageal and  possibly  esopahgeal function. There is a prominent CP, reduced  opening of UES and pt was noted to have significant esophageal  stasis building up to proximal esophagus with one instance of  backflow to pharynx. Pt is recommended to consume a dys 3  (mechanical soft)  diet with nectar thick liquids, intermittent  throat clear and esophageal precautions. Discussed concerns with  MD. Kerby Moors diet will reduce risk, but still pt may aspirate  esophageal backflow.     Treatment Recommendation  Therapy as outlined in treatment plan below    Diet Recommendation Dysphagia 3 (Mechanical Soft);Nectar-thick  liquid   Liquid Administration via: Cup;Straw Medication Administration: Crushed with puree Supervision: Patient able to self feed;Full supervision/cueing  for compensatory strategies Compensations: Slow rate;Small sips/bites;Clear throat  intermittently;Follow solids with liquid Postural Changes and/or Swallow Maneuvers: Seated upright 90  degrees;Upright 30-60 min after meal;Out of bed for meals    Other  Recommendations Oral Care Recommendations: Oral care BID Other Recommendations: Order thickener from pharmacy   Follow Up Recommendations  Inpatient Rehab    Frequency and Duration min 2x/week  2 weeks   Pertinent Vitals/Pain NA    SLP Swallow Goals     General HPI: 73 year old male with HTN and DM presented 5/27 to  ED c/o N/V since 5/25. In ED found to be in acute renal failure,  hyperkalemic, hypotensive with SBP's in 70s. Started on BiPAP for  hypoxia and increased WOB. Intubated from 5/28 to 6/1. Pt with  ICU delirium. Admission Chest CXR says New confluent bibasilar  pulmonary opacity, nonspecific but consider bilateral pneumonia  or aspiration.  Type of Study: Modified Barium Swallowing Study Reason for Referral: Objectively evaluate swallowing function Previous Swallow Assessment: MBS 01/21/14 - NPO Diet Prior to this Study: NPO;Panda Temperature Spikes Noted: No Respiratory Status: Room air History of Recent Intubation:  Yes Length of Intubations (days): 5 days Date extubated: 01/18/14 Behavior/Cognition: Alert;Cooperative;Requires  cueing;Distractible Oral Cavity - Dentition: Dentures, top;Dentures, bottom Oral Motor / Sensory Function: Impaired - see Bedside swallow  eval Self-Feeding Abilities: Able to feed self Patient Positioning: Upright in chair Baseline Vocal Quality: Clear Volitional Cough: Strong Volitional Swallow: Able to elicit Anatomy: Other (Comment) (Protrusion at c5/6, prominent CP) Pharyngeal Secretions: Not observed secondary MBS    Reason for Referral Objectively evaluate swallowing function   Oral Phase Oral Preparation/Oral Phase Oral Phase: WFL   Pharyngeal Phase Pharyngeal Phase Pharyngeal Phase: Impaired Pharyngeal - Honey Pharyngeal - Honey Teaspoon: Reduced epiglottic inversion;Reduced  anterior laryngeal mobility;Reduced laryngeal  elevation;Pharyngeal residue - valleculae;Pharyngeal residue - cp  segment Penetration/Aspiration details (honey teaspoon): Material does  not enter airway Pharyngeal - Nectar Pharyngeal - Nectar Teaspoon: Reduced epiglottic  inversion;Reduced anterior laryngeal mobility;Reduced laryngeal  elevation;Pharyngeal residue - valleculae;Pharyngeal residue - cp  segment Penetration/Aspiration details (nectar teaspoon): Material does  not enter airway Pharyngeal - Nectar Cup: Reduced epiglottic inversion;Reduced  anterior laryngeal mobility;Reduced laryngeal  elevation;Pharyngeal residue - valleculae;Pharyngeal residue - cp  segment;Penetration/Aspiration after swallow Penetration/Aspiration details (nectar cup): Material enters  airway, remains ABOVE vocal cords and not ejected out;Material  does not enter airway Pharyngeal - Nectar Straw: Reduced epiglottic inversion;Reduced  anterior laryngeal mobility;Reduced laryngeal  elevation;Pharyngeal residue - valleculae;Pharyngeal residue - cp  segment;Penetration/Aspiration after swallow Penetration/Aspiration details (nectar straw):  Material enters  airway, remains ABOVE vocal cords and not ejected out;Material  does not enter airway Pharyngeal - Thin Pharyngeal - Thin Teaspoon: Not tested Pharyngeal - Thin Cup: Reduced epiglottic inversion;Reduced  anterior laryngeal mobility;Reduced laryngeal  elevation;Pharyngeal residue - valleculae;Pharyngeal residue - cp  segment;Penetration/Aspiration after swallow;Trace aspiration Penetration/Aspiration details (thin cup): Material enters  airway, passes BELOW cords without attempt by patient to eject  out (silent aspiration);Material enters airway, CONTACTS cords  and not ejected out Pharyngeal - Thin Straw: Not tested  Pharyngeal - Solids Pharyngeal - Puree: Reduced epiglottic inversion;Reduced anterior  laryngeal mobility;Reduced laryngeal elevation;Pharyngeal residue  - valleculae;Pharyngeal residue - cp segment Penetration/Aspiration details (puree): Material does not enter  airway Pharyngeal - Regular: Reduced epiglottic inversion;Reduced  anterior laryngeal mobility;Reduced laryngeal  elevation;Pharyngeal residue - valleculae;Pharyngeal residue - cp  segment Pharyngeal - Pill: Not tested  Cervical Esophageal Phase    GO    Cervical Esophageal Phase Cervical Esophageal Phase: Impaired Cervical Esophageal Phase - Comment Cervical Esophageal Comment: see impression statement        Timothy Baltimore, MA CCC-SLP [phone]  Katherene Ponto Long 01/26/2014, 12:48 PM    Dg Swallowing Func-speech Pathology  01/21/2014   Katherene Ponto Long, CCC-SLP     01/21/2014 10:58 AM Objective Swallowing Evaluation: Modified Barium Swallowing Study   Patient Details  Name: [name] MRN: [id number] Date of Birth: [birth year]  Today's Date: 01/21/2014 Time: 0930-1000 SLP Time Calculation (min): 30 min  Past Medical History:  Past Medical History  Diagnosis Date  . Diabetes mellitus without complication   . Hypertension   . Hypercholesteremia    Past Surgical History:  Past Surgical History  Procedure Laterality Date  .  Prostatectomy    . Colon resection     HPI:  73 year old male with HTN and DM presented 5/27 to ED c/o N/V  since 5/25. In ED found to be in acute renal failure,  hyperkalemic, hypotensive with SBP's in 70s. Started on BiPAP for  hypoxia and increased WOB. Intubated from 5/28 to 6/1. Pt with  ICU delirium. Admission Chest CXR says New confluent bibasilar  pulmonary opacity, nonspecific but consider bilateral pneumonia  or aspiration.      Assessment / Plan / Recommendation Clinical Impression  Dysphagia Diagnosis: Moderate cervical esophageal phase  dysphagia;Severe pharyngeal phase dysphagia;Mild oral phase  dysphagia Clinical impression: Pt demonstrates a primary structural  cervical esophageal dysphagia, present at baseline, that is  likely worsed by pts mental status and generalized weakness. Pt  was awake, but required max cues to keep head up, bring cup to  lips and orally accept POs. Oral phase characterized by slow  transit. Pt noted to have the appearance of bony protrusion at  C5/6 and a prominent cricopharyngeus (no radiologist present to  confirm) as well as reduced laryngeal elevation and hyoid  excursion. These deficits result in incomplete airway closure  with silent aspiration during the swallow and residuals over the  CP segment and in the valleculae that are repentrated after the  swallow even if pt cued to clear his throat. Findings consistent  with possible admission with aspiration pna. Pt would be at high  risk of aspiration at this time and would benefit from several  days of recovery prior to retesting and attempting POs. Improved  mental status and general strength would be indicators for  retest. Recommend NPO with short term alternate nutrition. SLP  will revisit pt on Monday for possiblity of retest.     Treatment Recommendation  Therapy as outlined in treatment plan below    Diet Recommendation NPO;Alternative means - temporary   Medication Administration: Via alternative means    Other   Recommendations Oral Care Recommendations: Oral care Q4  per protocol   Follow Up Recommendations  Skilled Nursing facility    Frequency and Duration min 3x week  2 weeks   Pertinent Vitals/Pain NA    SLP Swallow Goals     General HPI: 73 year old male with HTN and DM  presented 5/27 to  ED c/o N/V since 5/25. In ED found to be in acute renal failure,  hyperkalemic, hypotensive with SBP's in 70s. Started on BiPAP for  hypoxia and increased WOB. Intubated from 5/28 to 6/1. Pt with  ICU delirium. Admission Chest CXR says New confluent bibasilar  pulmonary opacity, nonspecific but consider bilateral pneumonia  or aspiration.  Type of Study: Modified Barium Swallowing Study Reason for Referral: Objectively evaluate swallowing function Previous Swallow Assessment: none in chart Diet Prior to this Study: NPO Temperature Spikes Noted: No Respiratory Status: Nasal cannula History of Recent Intubation: Yes Length of Intubations (days): 5 days Date extubated: 01/18/14 Behavior/Cognition: Alert;Cooperative;Requires  cueing;Distractible Oral Cavity - Dentition: Dentures, top;Dentures, bottom Oral Motor / Sensory Function: Impaired - see Bedside swallow  eval Self-Feeding Abilities: Able to feed self;Needs assist Patient Positioning: Upright in chair Baseline Vocal Quality: Low vocal intensity;Wet Volitional Cough: Strong Volitional Swallow: Able to elicit Anatomy:  (bony protrustion at C5/6, Prominent CP, no radiologist  pres.) Pharyngeal Secretions:  (suspect standing secretions)    Reason for Referral Objectively evaluate swallowing function   Oral Phase Oral Preparation/Oral Phase Oral Phase: Impaired Oral - Honey Oral - Honey Teaspoon: Delayed oral transit Oral - Nectar Oral - Nectar Teaspoon: Delayed oral transit Oral - Nectar Cup: Delayed oral transit Oral - Thin Oral - Thin Teaspoon: Right anterior bolus loss;Left anterior  bolus loss;Delayed oral transit Oral - Thin Straw: Delayed oral transit (pt struggles to tip head   and cup back for thin cup) Oral - Solids Oral - Puree: Within functional limits   Pharyngeal Phase Pharyngeal Phase Pharyngeal Phase: Impaired Pharyngeal - Honey Pharyngeal - Honey Teaspoon: Reduced anterior laryngeal  mobility;Reduced epiglottic inversion;Reduced laryngeal  elevation;Reduced airway/laryngeal closure;Reduced tongue base  retraction;Penetration/Aspiration after  swallow;Penetration/Aspiration during swallow;Trace  aspiration;Pharyngeal residue - valleculae;Pharyngeal residue -  cp segment;Pharyngeal residue - pyriform sinuses;Compensatory  strategies attempted (Comment) (head turn left - not effective) Penetration/Aspiration details (honey teaspoon): Material enters  airway, CONTACTS cords and not ejected out;Material enters  airway, passes BELOW cords without attempt by patient to eject  out (silent aspiration) Pharyngeal - Nectar Pharyngeal - Nectar Teaspoon: Reduced anterior laryngeal  mobility;Reduced epiglottic inversion;Reduced laryngeal  elevation;Reduced airway/laryngeal closure;Reduced tongue base  retraction;Penetration/Aspiration after  swallow;Penetration/Aspiration during swallow;Trace  aspiration;Pharyngeal residue - valleculae;Pharyngeal residue -  cp segment;Pharyngeal residue - pyriform sinuses;Compensatory  strategies attempted (Comment) Penetration/Aspiration details (nectar teaspoon): Material enters  airway, CONTACTS cords and not ejected out;Material enters  airway, passes BELOW cords without attempt by patient to eject  out (silent aspiration) Pharyngeal - Nectar Cup: Reduced anterior laryngeal  mobility;Reduced epiglottic inversion;Reduced laryngeal  elevation;Reduced airway/laryngeal closure;Reduced tongue base  retraction;Penetration/Aspiration after  swallow;Penetration/Aspiration during swallow;Trace  aspiration;Pharyngeal residue - valleculae;Pharyngeal residue -  cp segment;Pharyngeal residue - pyriform sinuses;Compensatory  strategies attempted (Comment)  Penetration/Aspiration details (nectar cup): Material enters  airway, CONTACTS cords and not ejected out;Material enters  airway, passes BELOW cords without attempt by patient to eject  out (silent aspiration) Pharyngeal - Thin Pharyngeal - Thin Teaspoon: Not tested (all spilled from mouth) Pharyngeal - Thin Straw: Reduced anterior laryngeal  mobility;Reduced epiglottic inversion;Reduced laryngeal  elevation;Reduced airway/laryngeal closure;Reduced tongue base  retraction;Penetration/Aspiration during swallow;Pharyngeal  residue - valleculae;Pharyngeal residue - cp segment;Pharyngeal  residue - pyriform sinuses;Compensatory strategies attempted  (Comment);Moderate aspiration Penetration/Aspiration details (thin straw): Material enters  airway, passes BELOW cords without attempt by patient to eject  out (silent aspiration) Pharyngeal - Solids Pharyngeal - Puree: Reduced anterior laryngeal mobility;Reduced  epiglottic inversion;Reduced laryngeal  elevation;Reduced  airway/laryngeal closure;Reduced tongue base  retraction;Pharyngeal residue - valleculae;Pharyngeal residue -  cp segment;Pharyngeal residue - pyriform sinuses;Compensatory  strategies attempted (Comment) Penetration/Aspiration details (puree): Material does not enter  airway  Cervical Esophageal Phase    GO    Cervical Esophageal Phase Cervical Esophageal Phase: Impaired Cervical Esophageal Phase - Comment Cervical Esophageal Comment: see impression statement        Timothy Baltimore, MA CCC-SLP [phone]  Katherene Ponto Long 01/21/2014, 10:56 AM     Medications: Scheduled Meds: . antiseptic oral rinse  15 mL Mouth Rinse BID  . aspirin  81 mg Oral Daily  . carvedilol  3.125 mg Oral BID WC  . darbepoetin (ARANESP) injection - NON-DIALYSIS  100 mcg Subcutaneous Q Mon-1800  . insulin aspart  0-24 Units Subcutaneous TID WC  . pantoprazole  40 mg Oral QAC breakfast      LOS: 23 days   RAI,RIPUDEEP M.D. [facility] 02/05/2014, 11:55  AM Pager: [phone]  If 7PM-7AM, please contact night-coverage [email or web address] Password [email or web address]  **Disclaimer: This note was dictated with voice recognition software. Similar sounding words can inadvertently be transcribed and this note may contain transcription errors which may not have been corrected upon publication of note.**

## 2014-02-06 LAB — CBC
HEMATOCRIT: 28 % — AB (ref 39.0–52.0)
Hemoglobin: 8.8 g/dL — ABNORMAL LOW (ref 13.0–17.0)
MCH: 28.4 pg (ref 26.0–34.0)
MCHC: 31.4 g/dL (ref 30.0–36.0)
MCV: 90.3 fL (ref 78.0–100.0)
Platelets: 207 10*3/uL (ref 150–400)
RBC: 3.1 MIL/uL — ABNORMAL LOW (ref 4.22–5.81)
RDW: 15 % (ref 11.5–15.5)
WBC: 11.2 10*3/uL — ABNORMAL HIGH (ref 4.0–10.5)

## 2014-02-06 LAB — GLUCOSE, CAPILLARY
GLUCOSE-CAPILLARY: 172 mg/dL — AB (ref 70–99)
Glucose-Capillary: 92 mg/dL (ref 70–99)
Glucose-Capillary: 117 mg/dL — ABNORMAL HIGH (ref 70–99)
Glucose-Capillary: 107 mg/dL — ABNORMAL HIGH (ref 70–99)
Glucose-Capillary: 176 mg/dL — ABNORMAL HIGH (ref 70–99)

## 2014-02-06 LAB — RENAL FUNCTION PANEL
Albumin: 2.2 g/dL — ABNORMAL LOW (ref 3.5–5.2)
BUN: 39 mg/dL — ABNORMAL HIGH (ref 6–23)
CALCIUM: 8 mg/dL — AB (ref 8.4–10.5)
CO2: 24 meq/L (ref 19–32)
CREATININE: 4.53 mg/dL — AB (ref 0.50–1.35)
Chloride: 97 mEq/L (ref 96–112)
GFR calc Af Amer: 14 mL/min — ABNORMAL LOW (ref >90)
GFR, EST NON AFRICAN AMERICAN: 12 mL/min — AB (ref >90)
Glucose, Bld: 177 mg/dL — ABNORMAL HIGH (ref 70–99)
Phosphorus: 4.8 mg/dL — ABNORMAL HIGH (ref 2.3–4.6)
Potassium: 3.6 mEq/L — ABNORMAL LOW (ref 3.7–5.3)
Sodium: 139 mEq/L (ref 137–147)

## 2014-02-06 NOTE — Progress Notes (Signed)
Patient ID: [name]  male  YBW:[id number]    DOB: 1941-08-12    DOA: 01/13/2014  PCP: No primary provider on file.  Assessment/Plan: Principal Problem:  Hypotensive/Septic shock, aspiration pneumonia: Resolved. Patient met criteria given hypotension, end organ damage and required pressor support. Source felt to be pneumonia secondary to aspiration.  - Completed antibiotic course, continue dysphagia 3 diet.   Active problems Acute renal failure now in the setting of chronic renal disease. Improving, Nephrology following. Baseline Cr 1.05 in May 2015 - Creatinine 4.5, HD 6/19 - Catheter replaced 6/14 - Renal recommendations reviewed, may need hemodialysis temporarily TTS until kidney function recover  Metabolic acidosis: Secondary to uremia. Initially from septic shock.   Hyperkalemia: Secondary renal failure. Initially treated with dialysis.  - Resolved  Thrombocytopenia: Resolved. Secondary to thrombocytopenia critical illness. Improving   Anemia: Secondary to chronic renal disease and illness.   Atrial flutter: On 6/16, Patient started developing episodes of significant tachycardia during dialysis. Rhythm assessment found to be aflutter.  - Currently stable  Diabetes mellitus:  - CBG stable. On sliding scale only. A1c 5.5.  - At this point, discontinue metformin permanently given renal failure and try to manage with dietary control only   Dysphagia: In part due to mentation. Initially at dysphagia 3 with nectar thick liquids. Upgraded by speech therapy to than likely this on 6/16. Patient has history of presbyesophagus. Speech therapy should follow even at skilled nursing facility   HTN: Slightly elevated. Started on beta blocker 6/13   Hyperlipidemia   Diastolic heart failure: Echocardiogram done in the setting of ?Non-STEMI. Grade 2 diastolic dysfunction noted.  NSTEMI: Demand ischemia in the setting of septic shock. Resolved.   Toe ulceration: Patient was scheduled  by orthopedic surgery to have planned left fourth toe amputation done 6/25. Hospitalist left message for need for postponement   DVT Prophylaxis:  Code Status:  Family Communication:   Disposition: Will need skilled nursing facility  Consultants:   nephrology Critical Care  Interventional radiology  Speech therapy   Procedures:  Status post intubation on 5/28-extubated 6/1  Temporary dialysis catheter placed 5/28, patient removed it on 6/11  CVVH started 5/20  Echo 5/28 >> EF 65 to 76%, grade 2 diastolic dysfx, mod AS  Placement of dialysis catheter by interventional radiology on 6/14      Antibiotics:  Zosyn 5/27 >>> 6/03  Vancomycin 5/27 >>> 6/02  Subjective: Patient seen earlier this morning, no complaints, wife at the bedside  Objective: Weight change: -1.464 kg (-3 lb 3.6 oz)  Intake/Output Summary (Last 24 hours) at 02/06/14 1212 Last data filed at 02/05/14 1824  Gross per 24 hour  Intake    480 ml  Output    200 ml  Net    280 ml   Blood pressure 138/76, pulse 77, temperature 99 F (37.2 C), temperature source Oral, resp. rate 16, height 5' 11"  (1.803 m), weight 73.936 kg (163 lb), SpO2 95.00%.  Physical Exam: General: Alert and awake, NAD oriented, frail . CVS: S1-S2 clear, 3/6 holosystolic murmur  Chest: CTAB Abdomen: soft nontender, nondistended, normal bowel sounds  Extremities: no cyanosis, clubbing or edema noted bilaterally   Lab Results: Basic Metabolic Panel:  Recent Labs Lab 02/05/14 0810 02/06/14 1024  NA 137 139  K 3.7 3.6*  CL 96 97  CO2 27 24  GLUCOSE 105* 177*  BUN 24* 39*  CREATININE 3.19* 4.53*  CALCIUM 8.1* 8.0*  PHOS 4.2 4.8*   Liver Function  Tests:  Recent Labs Lab 02/05/14 0810 02/06/14 1024  ALBUMIN 2.2* 2.2*   No results found for this basename: LIPASE, AMYLASE,  in the last 168 hours No results found for this basename: AMMONIA,  in the last 168 hours CBC:  Recent Labs Lab 02/04/14 0541  02/06/14 1031  WBC 10.1 11.2*  HGB 9.3* 8.8*  HCT 29.5* 28.0*  MCV 89.7 90.3  PLT 249 207   Cardiac Enzymes: No results found for this basename: CKTOTAL, CKMB, CKMBINDEX, TROPONINI,  in the last 168 hours BNP: No components found with this basename: POCBNP,  CBG:  Recent Labs Lab 02/05/14 1152 02/05/14 1633 02/05/14 2200 02/06/14 0757 02/06/14 1155  GLUCAP 154* 132* 92 117* 172*     Micro Results: No results found for this or any previous visit (from the past 240 hour(s)).  Studies/Results: Dg Abd 1 View  01/23/2014   CLINICAL DATA:  Feeding tube placement.  EXAM: ABDOMEN - 1 VIEW  COMPARISON:  01/21/2014  FINDINGS: Feeding tube tip is seen with tip in the fundus of the stomach. Some residual contrast is seen within the colon. No dilated bowel loops seen.  IMPRESSION: Feeding tube tip in fundus of stomach.   Electronically Signed   By: Earle Gell M.D.   On: 01/23/2014 18:57   Dg Esophagus  01/26/2014   CLINICAL DATA:  Dysphagia. Modified swallow earlier in the morning only clearing patient for nectar thickness.  EXAM: ESOPHOGRAM/BARIUM SWALLOW  TECHNIQUE: Single contrast examination was performed using  thick barium.  FLUOROSCOPY TIME:  3 min and 20 is seconds  COMPARISON:  Chest radiograph 01/19/2014.  FINDINGS: Focused single-contrast exam was performed in a supine, mildly LPO position. The bed was maintained partially upright.  Evaluation of a single swallow demonstrates contrast stasis throughout the mid and upper thoracic esophagus.  Full column evaluation of the esophagus demonstrates no persistent narrowing or stricture.  13 mm barium tablet has initial delay in the vallecula. On subsequent swallows, the tablet passes into the esophagus and then into the stomach.  IMPRESSION: 1. Moderately degraded exam. 2. 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 3. No evidence of  stricture other anatomic cause for dysphagia. 4. Delayed passage of the barium tablet at the level  of the vallecula.   Electronically Signed   By: Abigail Miyamoto M.D.   On: 01/26/2014 16:14   US Renal  01/14/2014   CLINICAL DATA:  Renal failure, history hypertension, diabetes  EXAM: RENAL/URINARY TRACT ULTRASOUND COMPLETE  COMPARISON:  None  FINDINGS: Right Kidney:  Length: 12.4 cm length. Cortical thickness up to 1.8 cm thick. Increased cortical echogenicity. Hypoechoic nodule medial aspect mid RIGHT kidney 2.1 x 2.2 x 2.1 cm containing scattered internal echoes question complicated cyst. No additional mass, hydronephrosis or shadowing calcification.  Left Kidney:  Length: 10.5 cm length. Cortical thickness up to 2.0 cm thick. Increased cortical echogenicity. Minimally complicated cyst at mid LEFT kidney 2.7 x 3.3 x 3.7 cm containing slightly irregular wall and scattered internal echoes. No additional mass, hydronephrosis or shadowing calcification.  Bladder:  Contains only minimal urine. Minimally enlarged central lobe of prostate.  IMPRESSION: Increased renal cortical echogenicity bilaterally consistent with medical renal disease.  Mildly complicated cysts within the mid portions of both kidneys.  No evidence of hydronephrosis.   Electronically Signed   By: Lavonia Dana M.D.   On: 01/14/2014 02:31   Ir Fluoro Guide Cv Line Right  01/31/2014   INDICATION: Renal failure and needs access for hemodialysis.  EXAM: FLUOROSCOPIC AND ULTRASOUND GUIDED PLACEMENT OF A TUNNELED DIALYSIS CATHETER  Physician: Stephan Minister. Anselm Pancoast, MD  FLUOROSCOPY TIME:  24 seconds  MEDICATIONS: 0.5 mg versed, 25 mcg fentanyl. A radiology nurse monitored the patient for moderate sedation.  ANESTHESIA/SEDATION: Moderate sedation time: 19 minutes  PROCEDURE: Informed consent was obtained for placement of a tunneled dialysis catheter. The patient was placed supine on the interventional table. Ultrasound confirmed a patent right internal jugularvein. Ultrasound images were obtained for documentation. The right side of the neck was prepped and draped  in a sterile fashion. The right side of the neck was anesthetized with 1% lidocaine. Maximal barrier sterile technique was utilized including caps, mask, sterile gowns, sterile gloves, sterile drape, hand hygiene and skin antiseptic. A small incision was made with #11 blade scalpel. A 21 gauge needle directed into the right internal jugular vein with ultrasound guidance. A micropuncture dilator set was placed. A 19 cm tip to cuff HemoSplit catheter was selected. The skin below the right clavicle was anesthetized and a small incision was made with an #11 blade scalpel. A subcutaneous tunnel was formed to the vein dermatotomy site. The catheter was brought through the tunnel. The vein dermatotomy site was dilated to accommodate a peel-away sheath. The catheter was placed through the peel-away sheath and directed into the central venous structures. The tip of the catheter was placed in the lower SVC with fluoroscopy. Fluoroscopic images were obtained for documentation. Both lumens were found to aspirate and flush well. The proper amount of heparin was flushed in both lumens. The vein dermatotomy site was closed using a single layer of absorbable suture and Dermabond. The catheter was secured to the skin using Prolene suture.  FINDINGS: Catheter tip in the lower SVC.  COMPLICATIONS: None  IMPRESSION: Successful placement of a right jugular tunneled dialysis catheter using ultrasound and fluoroscopic guidance.   Electronically Signed   By: Markus Daft M.D.   On: 01/31/2014 13:45   Ir US Guide Vasc Access Right  01/31/2014   INDICATION: Renal failure and needs access for hemodialysis.  EXAM: FLUOROSCOPIC AND ULTRASOUND GUIDED PLACEMENT OF A TUNNELED DIALYSIS CATHETER  Physician: Stephan Minister. Anselm Pancoast, MD  FLUOROSCOPY TIME:  24 seconds  MEDICATIONS: 0.5 mg versed, 25 mcg fentanyl. A radiology nurse monitored the patient for moderate sedation.  ANESTHESIA/SEDATION: Moderate sedation time: 19 minutes  PROCEDURE: Informed consent  was obtained for placement of a tunneled dialysis catheter. The patient was placed supine on the interventional table. Ultrasound confirmed a patent right internal jugularvein. Ultrasound images were obtained for documentation. The right side of the neck was prepped and draped in a sterile fashion. The right side of the neck was anesthetized with 1% lidocaine. Maximal barrier sterile technique was utilized including caps, mask, sterile gowns, sterile gloves, sterile drape, hand hygiene and skin antiseptic. A small incision was made with #11 blade scalpel. A 21 gauge needle directed into the right internal jugular vein with ultrasound guidance. A micropuncture dilator set was placed. A 19 cm tip to cuff HemoSplit catheter was selected. The skin below the right clavicle was anesthetized and a small incision was made with an #11 blade scalpel. A subcutaneous tunnel was formed to the vein dermatotomy site. The catheter was brought through the tunnel. The vein dermatotomy site was dilated to accommodate a peel-away sheath. The catheter was placed through the peel-away sheath and directed into the central venous structures. The tip of the catheter was placed in the lower SVC with fluoroscopy. Fluoroscopic images  were obtained for documentation. Both lumens were found to aspirate and flush well. The proper amount of heparin was flushed in both lumens. The vein dermatotomy site was closed using a single layer of absorbable suture and Dermabond. The catheter was secured to the skin using Prolene suture.  FINDINGS: Catheter tip in the lower SVC.  COMPLICATIONS: None  IMPRESSION: Successful placement of a right jugular tunneled dialysis catheter using ultrasound and fluoroscopic guidance.   Electronically Signed   By: Markus Daft M.D.   On: 01/31/2014 13:45   Dg Chest Port 1 View  01/19/2014   CLINICAL DATA:  Cough  EXAM: PORTABLE CHEST - 1 VIEW  COMPARISON:  01/18/2014  FINDINGS: Cardiac shadow is stable. A right jugular  line and left jugular temporary dialysis catheter are again seen and stable. The endotracheal tube and nasogastric catheter have been removed. The lungs are well aerated bilaterally. Improved aeration is again seen bilaterally with persistent changes in the right lung base. No new focal infiltrate is seen.  IMPRESSION: Improved aeration with some persistent changes in the right base.  Tubes and lines as described.   Electronically Signed   By: Inez Catalina M.D.   On: 01/19/2014 06:53   Dg Chest Port 1 View  01/18/2014   CLINICAL DATA:  Check endotracheal tube position.  EXAM: PORTABLE CHEST - 1 VIEW  COMPARISON:  01/16/2014.  FINDINGS: Endotracheal tube terminates approximately 3.1 cm above the carina. Nasogastric tube is followed into the stomach. Right IJ central line tip projects over the low SVC. Left IJ catheter tip projects over the brachiocephalic vein confluence or upper SVC. Heart is enlarged, stable. Thoracic aorta is calcified. There is mild-to-moderate diffuse bilateral airspace disease with slight improvement in aeration at the right lung base. No definite pleural fluid.  IMPRESSION: Mild to moderate diffuse bilateral airspace disease, likely due to pulmonary edema. Slight improvement in aeration at the right lung base.   Electronically Signed   By: Lorin Picket M.D.   On: 01/18/2014 07:40   Dg Chest Port 1 View  01/16/2014   CLINICAL DATA:  Aspiration pneumonia  EXAM: PORTABLE CHEST - 1 VIEW  COMPARISON:  01/15/2014  FINDINGS: Endotracheal tube ends in the mid thoracic trachea. Unchanged positioning of bilateral IJ catheters, tips at the level of the SVC. Enteric tube enters the stomach.  Bibasilar airspace disease shows no progression or clearing. No evidence of increasing pleural fluid or air leak.  IMPRESSION: 1. Stable positioning of tubes and lines. 2. Unchanged bibasilar pneumonia.   Electronically Signed   By: Jorje Guild M.D.   On: 01/16/2014 07:18   Dg Chest Port 1  View  01/15/2014   CLINICAL DATA:  Ventilator support.  Pneumonia.  EXAM: PORTABLE CHEST - 1 VIEW  COMPARISON:  01/14/2014  FINDINGS: Endotracheal tube has its tip 4 cm above the carina. Nasogastric tube enters the stomach. Right internal jugular central line has its tip in the SVC above the right atrium. Left internal jugular central line tips are in the SVC above the right atrium. Bilateral lower lobe airspace filling consistent with pneumonia persists, probably slightly improved since yesterday. The upper lungs remain clear. No effusions.  IMPRESSION: Lines and tubes well positioned. Slight improvement in bilateral lower lobe pneumonia.   Electronically Signed   By: Nelson Chimes M.D.   On: 01/15/2014 07:35   Dg Chest Port 1 View  01/14/2014   CLINICAL DATA:  Status post central line placement  EXAM: PORTABLE CHEST - 1  VIEW  COMPARISON:  01/14/2014 225 hr  FINDINGS: A left jugular dual-lumen central line is again identified and stable. A new right jugular central line is seen with the catheter tip at the cavoatrial junction. No pneumothorax is noted. The endotracheal tube is seen 5 cm above the carina. A nasogastric catheter is noted extending into the stomach. Bilateral lower lung infiltrates are seen. The overall appearance is stable. No new focal abnormality is noted.  IMPRESSION: Stable bilateral infiltrates.  New tubes and lines as described above without complicating factors.   Electronically Signed   By: Inez Catalina M.D.   On: 01/14/2014 09:33   Dg Chest Port 1 View  01/14/2014   CLINICAL DATA:  Hemodialysis catheter placement  EXAM: Portable exam 0225 hr compared to 01/13/2014  COMPARISON:  None.  FINDINGS: LEFT jugular dual-lumen central venous catheter with tip projecting over mid SVC.  Enlargement of cardiac silhouette with pulmonary vascular congestion.  Perihilar and basilar infiltrates which could represent edema or infection.  No pneumothorax.  Bones unremarkable.  IMPRESSION: No  pneumothorax following LEFT jugular line placement.  Enlargement of cardiac silhouette with pulmonary vascular congestion and diffuse increased pulmonary infiltrates, question edema versus infection.   Electronically Signed   By: Lavonia Dana M.D.   On: 01/14/2014 02:41   Dg Chest Port 1 View  01/13/2014   CLINICAL DATA:  73 year old male with back and knee pain. Shortness of Breath. Initial encounter.  EXAM: PORTABLE CHEST - 1 VIEW  COMPARISON:  11/21/2007.  FINDINGS: Portable AP supine view at 2211 hrs. New confluent bibasilar pulmonary opacity. No pneumothorax. Pulmonary vascularity within normal limits. Stable cardiac size and mediastinal contours. Visualized tracheal air column is within normal limits.  IMPRESSION: New confluent bibasilar pulmonary opacity, nonspecific but consider bilateral pneumonia or aspiration.   Electronically Signed   By: Lars Pinks M.D.   On: 01/13/2014 23:02   Dg Abd Portable 1v  01/23/2014   CLINICAL DATA:  Feeding tube placement.  EXAM: PORTABLE ABDOMEN - 1 VIEW  COMPARISON:  01/23/2014  FINDINGS: The Dobbhoff feeding tube is looped back on itself in the descending duodenum. The tip is in the antropyloric region of the stomach.  IMPRESSION: Dobbhoff feeding tube is looped back on itself in the descending duodenum.   Electronically Signed   By: Kalman Jewels M.D.   On: 01/23/2014 23:44   Dg Abd Portable 1v  01/21/2014   CLINICAL DATA:  Feeding tube placement.  EXAM: PORTABLE ABDOMEN - 1 VIEW  COMPARISON:  None.  FINDINGS: Weighted tip feeding tube is present in the proximal gastric fundus. Barium is present in small bowel. The bowel gas pattern is nonobstructive. Gaseous distention of the colon.  IMPRESSION: Weighted tip feeding tube in the proximal gastric fundus.   Electronically Signed   By: Dereck Ligas M.D.   On: 01/21/2014 16:32   Dg Swallowing Func-speech Pathology  02/02/2014   Katherene Ponto Deblois, CCC-SLP     02/02/2014 10:53 AM Objective Swallowing  Evaluation: Modified Barium Swallowing Study   Patient Details  Name: ALVA BROXSON MRN: [id number] Date of Birth: [birth year]  Today's Date: 02/02/2014 Time: 1000-1030 SLP Time Calculation (min): 30 min  Past Medical History:  Past Medical History  Diagnosis Date  . Diabetes mellitus without complication   . Hypertension   . Hypercholesteremia    Past Surgical History:  Past Surgical History  Procedure Laterality Date  . Prostatectomy    . Colon resection     HPI:  73 year old male with HTN and DM presented 5/27 to ED c/o N/V  since 5/25. In ED found to be in acute renal failure,  hyperkalemic, hypotensive with SBP's in 70s. Started on BiPAP for  hypoxia and increased WOB. Intubated from 5/28 to 6/1. Pt with  ICU delirium. Admission Chest CXR says New confluent bibasilar  pulmonary opacity, nonspecific but consider bilateral pneumonia  or aspiration. Pt has undergone multiple MBS studies with  yesterday's study showing improved swallow function to allow po  intake with strict precautions.       Assessment / Plan / Recommendation Clinical Impression  Dysphagia Diagnosis: Moderate pharyngeal phase  dysphagia;Moderate cervical esophageal phase dysphagia Clinical impression: Pt demonstrates swallow function that is  likely his baseline. Strength has continued to improve with  minimal oropharygneal residuals even with solid textures. 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 Pt  consistently has trace silent penetration to the cords with small  to normal sips of thin liquids. He had aspiration when trying to  swallow too quickly or tilting his head dramatically back. If pt  clears his throat every 2-3 sips he can expel trace penetrates.  Again this function appears to be pts baseline. Recommend a dys 3  (mechanical soft) diet with thin liquids and an intermittent  throat clear with ongoing therapy to address compensatory  strategies.      Treatment Recommendation  Therapy as outlined in treatment plan below    Diet Recommendation Dysphagia 3 (Mechanical Soft);Thin liquid   Liquid Administration via: Cup;No straw Medication Administration: Whole meds with puree Supervision: Patient able to self feed;Full supervision/cueing  for compensatory strategies Compensations: Slow rate;Small sips/bites;Clear throat  intermittently;Follow solids with liquid Postural Changes and/or Swallow Maneuvers: Seated upright 90  degrees;Upright 30-60 min after meal;Out of bed for meals    Other  Recommendations Oral Care Recommendations: Oral care BID   Follow Up Recommendations  Inpatient Rehab    Frequency and Duration min 2x/week  2 weeks   Pertinent Vitals/Pain NA    SLP Swallow Goals     General HPI: 73 year old male with HTN and DM presented 5/27 to  ED c/o N/V since 5/25. In ED found to be in acute renal failure,  hyperkalemic, hypotensive with SBP's in 70s. Started on BiPAP for  hypoxia and increased WOB. Intubated from 5/28 to 6/1. Pt with  ICU delirium. Admission Chest CXR says New confluent bibasilar  pulmonary opacity, nonspecific but consider bilateral pneumonia  or aspiration. Pt has undergone multiple MBS studies with  yesterday's study showing improved swallow function to allow po  intake with strict precautions.   Type of Study: Modified Barium Swallowing Study Reason for Referral: Objectively evaluate swallowing function Diet Prior to this Study: Dysphagia 3 (soft);Nectar-thick liquids Temperature Spikes Noted: No Respiratory Status: Room air History of Recent Intubation: Yes Length of Intubations (days): 5 days Date extubated: 01/18/14 Behavior/Cognition: Alert;Cooperative;Pleasant mood Oral Cavity - Dentition: Dentures, top;Dentures, bottom Oral Motor / Sensory Function: Within functional limits Self-Feeding Abilities: Able to feed self Patient Positioning: Upright in chair Baseline Vocal Quality: Clear Volitional Cough: Strong Volitional Swallow: Able  to elicit Anatomy: Other (Comment) (bony protrustion at C5/6, Prominent CP,  no radiologist pres) Pharyngeal Secretions: Not observed secondary MBS    Reason for Referral Objectively evaluate swallowing function   Oral Phase Oral Preparation/Oral Phase Oral Phase: WFL Oral - Honey Oral - Honey Teaspoon: Not tested Oral -  Nectar Oral - Nectar Teaspoon: Not tested Oral - Nectar Cup: Not tested Oral - Thin Oral - Thin Teaspoon: Not tested Oral - Thin Straw: Within functional limits (with a chin tuck) Oral - Solids Oral - Puree: Within functional limits   Pharyngeal Phase Pharyngeal Phase Pharyngeal Phase: Impaired Pharyngeal - Honey Pharyngeal - Honey Teaspoon: Not tested Pharyngeal - Nectar Pharyngeal - Nectar Teaspoon: Not tested Pharyngeal - Nectar Cup: Not tested Pharyngeal - Nectar Straw: Not tested Pharyngeal - Thin Pharyngeal - Thin Teaspoon: Not tested Pharyngeal - Thin Cup: Delayed swallow initiation;Reduced  laryngeal elevation;Reduced airway/laryngeal  closure;Penetration/Aspiration before swallow;Trace aspiration Penetration/Aspiration details (thin cup): Material enters  airway, CONTACTS cords and not ejected out;Material enters  airway, passes BELOW cords without attempt by patient to eject  out (silent aspiration) Pharyngeal - Thin Straw: Delayed swallow initiation;Reduced  laryngeal elevation;Reduced airway/laryngeal  closure;Penetration/Aspiration before swallow;Trace aspiration Penetration/Aspiration details (thin straw): Material enters  airway, CONTACTS cords and not ejected out Pharyngeal - Solids Pharyngeal - Puree: Pharyngeal residue - valleculae;Reduced  laryngeal elevation;Reduced epiglottic inversion Penetration/Aspiration details (puree): Material does not enter  airway Pharyngeal - Regular: Pharyngeal residue - valleculae;Reduced  laryngeal elevation;Reduced epiglottic inversion  Cervical Esophageal Phase    GO    Cervical Esophageal Phase Cervical Esophageal Phase: Impaired Cervical  Esophageal Phase - Comment Cervical Esophageal Comment: see impression statement        Herbie Baltimore, MA CCC-SLP [phone]  Lynann Beaver 02/02/2014, 10:51 AM    Dg Swallowing Func-speech Pathology  01/26/2014   Katherene Ponto Deblois, CCC-SLP     01/26/2014 12:49 PM Objective Swallowing Evaluation: Modified Barium Swallowing Study   Patient Details  Name: ESTABAN MAINVILLE MRN: [id number] Date of Birth: August 27, 1940  Today's Date: 01/26/2014 Time: 2197-5883 SLP Time Calculation (min): 19 min  Past Medical History:  Past Medical History  Diagnosis Date  . Diabetes mellitus without complication   . Hypertension   . Hypercholesteremia    Past Surgical History:  Past Surgical History  Procedure Laterality Date  . Prostatectomy    . Colon resection     HPI:  73 year old male with HTN and DM presented 5/27 to ED c/o N/V  since 5/25. In ED found to be in acute renal failure,  hyperkalemic, hypotensive with SBP's in 70s. Started on BiPAP for  hypoxia and increased WOB. Intubated from 5/28 to 6/1. Pt with  ICU delirium. Admission Chest CXR says New confluent bibasilar  pulmonary opacity, nonspecific but consider bilateral pneumonia  or aspiration.      Assessment / Plan / Recommendation Clinical Impression  Dysphagia Diagnosis: Mild cervical esophageal phase  dysphagia;Suspected primary esophageal dysphagia;Mild pharyngeal  phase dysphagia Clinical impression: Overall strength and timeliness of swallow  has significantly improved though mild to moderate dyspahgia  persists. Reduced hyoid excursion and base of tongue retraction  leads to mild to moderate vallecular residuals that, with thin  liquids, are silently penetrated/aspirated after the swallow.  Quantity and severity of penetration with nectar thick liquids is  trace and is easily cleared with occasional throat clear. The  primary problem is likely more related to cervical esophageal and  possibly esopahgeal function. There is a prominent CP, reduced  opening of UES  and pt was noted to have significant esophageal  stasis building up to proximal esophagus with one instance of  backflow to pharynx. Pt is recommended to consume a dys 3  (mechanical soft) diet with nectar thick liquids, intermittent  throat clear and esophageal precautions. Discussed  concerns with  MD. Kerby Moors diet will reduce risk, but still pt may aspirate  esophageal backflow.     Treatment Recommendation  Therapy as outlined in treatment plan below    Diet Recommendation Dysphagia 3 (Mechanical Soft);Nectar-thick  liquid   Liquid Administration via: Cup;Straw Medication Administration: Crushed with puree Supervision: Patient able to self feed;Full supervision/cueing  for compensatory strategies Compensations: Slow rate;Small sips/bites;Clear throat  intermittently;Follow solids with liquid Postural Changes and/or Swallow Maneuvers: Seated upright 90  degrees;Upright 30-60 min after meal;Out of bed for meals    Other  Recommendations Oral Care Recommendations: Oral care BID Other Recommendations: Order thickener from pharmacy   Follow Up Recommendations  Inpatient Rehab    Frequency and Duration min 2x/week  2 weeks   Pertinent Vitals/Pain NA    SLP Swallow Goals     General HPI: 73 year old male with HTN and DM presented 5/27 to  ED c/o N/V since 5/25. In ED found to be in acute renal failure,  hyperkalemic, hypotensive with SBP's in 70s. Started on BiPAP for  hypoxia and increased WOB. Intubated from 5/28 to 6/1. Pt with  ICU delirium. Admission Chest CXR says New confluent bibasilar  pulmonary opacity, nonspecific but consider bilateral pneumonia  or aspiration.  Type of Study: Modified Barium Swallowing Study Reason for Referral: Objectively evaluate swallowing function Previous Swallow Assessment: MBS 01/21/14 - NPO Diet Prior to this Study: NPO;Panda Temperature Spikes Noted: No Respiratory Status: Room air History of Recent Intubation: Yes Length of Intubations (days): 5 days Date extubated: 01/18/14  Behavior/Cognition: Alert;Cooperative;Requires  cueing;Distractible Oral Cavity - Dentition: Dentures, top;Dentures, bottom Oral Motor / Sensory Function: Impaired - see Bedside swallow  eval Self-Feeding Abilities: Able to feed self Patient Positioning: Upright in chair Baseline Vocal Quality: Clear Volitional Cough: Strong Volitional Swallow: Able to elicit Anatomy: Other (Comment) (Protrusion at c5/6, prominent CP) Pharyngeal Secretions: Not observed secondary MBS    Reason for Referral Objectively evaluate swallowing function   Oral Phase Oral Preparation/Oral Phase Oral Phase: WFL   Pharyngeal Phase Pharyngeal Phase Pharyngeal Phase: Impaired Pharyngeal - Honey Pharyngeal - Honey Teaspoon: Reduced epiglottic inversion;Reduced  anterior laryngeal mobility;Reduced laryngeal  elevation;Pharyngeal residue - valleculae;Pharyngeal residue - cp  segment Penetration/Aspiration details (honey teaspoon): Material does  not enter airway Pharyngeal - Nectar Pharyngeal - Nectar Teaspoon: Reduced epiglottic  inversion;Reduced anterior laryngeal mobility;Reduced laryngeal  elevation;Pharyngeal residue - valleculae;Pharyngeal residue - cp  segment Penetration/Aspiration details (nectar teaspoon): Material does  not enter airway Pharyngeal - Nectar Cup: Reduced epiglottic inversion;Reduced  anterior laryngeal mobility;Reduced laryngeal  elevation;Pharyngeal residue - valleculae;Pharyngeal residue - cp  segment;Penetration/Aspiration after swallow Penetration/Aspiration details (nectar cup): Material enters  airway, remains ABOVE vocal cords and not ejected out;Material  does not enter airway Pharyngeal - Nectar Straw: Reduced epiglottic inversion;Reduced  anterior laryngeal mobility;Reduced laryngeal  elevation;Pharyngeal residue - valleculae;Pharyngeal residue - cp  segment;Penetration/Aspiration after swallow Penetration/Aspiration details (nectar straw): Material enters  airway, remains ABOVE vocal cords and not ejected  out;Material  does not enter airway Pharyngeal - Thin Pharyngeal - Thin Teaspoon: Not tested Pharyngeal - Thin Cup: Reduced epiglottic inversion;Reduced  anterior laryngeal mobility;Reduced laryngeal  elevation;Pharyngeal residue - valleculae;Pharyngeal residue - cp  segment;Penetration/Aspiration after swallow;Trace aspiration Penetration/Aspiration details (thin cup): Material enters  airway, passes BELOW cords without attempt by patient to eject  out (silent aspiration);Material enters airway, CONTACTS cords  and not ejected out Pharyngeal - Thin Straw: Not tested Pharyngeal - Solids Pharyngeal - Puree: Reduced epiglottic inversion;Reduced anterior  laryngeal mobility;Reduced  laryngeal elevation;Pharyngeal residue  - valleculae;Pharyngeal residue - cp segment Penetration/Aspiration details (puree): Material does not enter  airway Pharyngeal - Regular: Reduced epiglottic inversion;Reduced  anterior laryngeal mobility;Reduced laryngeal  elevation;Pharyngeal residue - valleculae;Pharyngeal residue - cp  segment Pharyngeal - Pill: Not tested  Cervical Esophageal Phase    GO    Cervical Esophageal Phase Cervical Esophageal Phase: Impaired Cervical Esophageal Phase - Comment Cervical Esophageal Comment: see impression statement        Herbie Baltimore, MA CCC-SLP [phone]  Katherene Ponto Deblois 01/26/2014, 12:48 PM    Dg Swallowing Func-speech Pathology  01/21/2014   Katherene Ponto Deblois, CCC-SLP     01/21/2014 10:58 AM Objective Swallowing Evaluation: Modified Barium Swallowing Study   Patient Details  Name: HAZEN BRUMETT MRN: [id number] Date of Birth: [birth year]  Today's Date: 01/21/2014 Time: 0930-1000 SLP Time Calculation (min): 30 min  Past Medical History:  Past Medical History  Diagnosis Date  . Diabetes mellitus without complication   . Hypertension   . Hypercholesteremia    Past Surgical History:  Past Surgical History  Procedure Laterality Date  . Prostatectomy    . Colon resection     HPI:  73 year old male with  HTN and DM presented 5/27 to ED c/o N/V  since 5/25. In ED found to be in acute renal failure,  hyperkalemic, hypotensive with SBP's in 70s. Started on BiPAP for  hypoxia and increased WOB. Intubated from 5/28 to 6/1. Pt with  ICU delirium. Admission Chest CXR says New confluent bibasilar  pulmonary opacity, nonspecific but consider bilateral pneumonia  or aspiration.      Assessment / Plan / Recommendation Clinical Impression  Dysphagia Diagnosis: Moderate cervical esophageal phase  dysphagia;Severe pharyngeal phase dysphagia;Mild oral phase  dysphagia Clinical impression: Pt demonstrates a primary structural  cervical esophageal dysphagia, present at baseline, that is  likely worsed by pts mental status and generalized weakness. Pt  was awake, but required max cues to keep head up, bring cup to  lips and orally accept POs. Oral phase characterized by slow  transit. Pt noted to have the appearance of bony protrusion at  C5/6 and a prominent cricopharyngeus (no radiologist present to  confirm) as well as reduced laryngeal elevation and hyoid  excursion. These deficits result in incomplete airway closure  with silent aspiration during the swallow and residuals over the  CP segment and in the valleculae that are repentrated after the  swallow even if pt cued to clear his throat. Findings consistent  with possible admission with aspiration pna. Pt would be at high  risk of aspiration at this time and would benefit from several  days of recovery prior to retesting and attempting POs. Improved  mental status and general strength would be indicators for  retest. Recommend NPO with short term alternate nutrition. SLP  will revisit pt on Monday for possiblity of retest.     Treatment Recommendation  Therapy as outlined in treatment plan below    Diet Recommendation NPO;Alternative means - temporary   Medication Administration: Via alternative means    Other  Recommendations Oral Care Recommendations: Oral care Q4  per  protocol   Follow Up Recommendations  Skilled Nursing facility    Frequency and Duration min 3x week  2 weeks   Pertinent Vitals/Pain NA    SLP Swallow Goals     General HPI: 73 year old male with HTN and DM presented 5/27 to  ED c/o N/V since 5/25. In ED found to  be in acute renal failure,  hyperkalemic, hypotensive with SBP's in 70s. Started on BiPAP for  hypoxia and increased WOB. Intubated from 5/28 to 6/1. Pt with  ICU delirium. Admission Chest CXR says New confluent bibasilar  pulmonary opacity, nonspecific but consider bilateral pneumonia  or aspiration.  Type of Study: Modified Barium Swallowing Study Reason for Referral: Objectively evaluate swallowing function Previous Swallow Assessment: none in chart Diet Prior to this Study: NPO Temperature Spikes Noted: No Respiratory Status: Nasal cannula History of Recent Intubation: Yes Length of Intubations (days): 5 days Date extubated: 01/18/14 Behavior/Cognition: Alert;Cooperative;Requires  cueing;Distractible Oral Cavity - Dentition: Dentures, top;Dentures, bottom Oral Motor / Sensory Function: Impaired - see Bedside swallow  eval Self-Feeding Abilities: Able to feed self;Needs assist Patient Positioning: Upright in chair Baseline Vocal Quality: Low vocal intensity;Wet Volitional Cough: Strong Volitional Swallow: Able to elicit Anatomy:  (bony protrustion at C5/6, Prominent CP, no radiologist  pres.) Pharyngeal Secretions:  (suspect standing secretions)    Reason for Referral Objectively evaluate swallowing function   Oral Phase Oral Preparation/Oral Phase Oral Phase: Impaired Oral - Honey Oral - Honey Teaspoon: Delayed oral transit Oral - Nectar Oral - Nectar Teaspoon: Delayed oral transit Oral - Nectar Cup: Delayed oral transit Oral - Thin Oral - Thin Teaspoon: Right anterior bolus loss;Left anterior  bolus loss;Delayed oral transit Oral - Thin Straw: Delayed oral transit (pt struggles to tip head  and cup back for thin cup) Oral - Solids Oral - Puree: Within  functional limits   Pharyngeal Phase Pharyngeal Phase Pharyngeal Phase: Impaired Pharyngeal - Honey Pharyngeal - Honey Teaspoon: Reduced anterior laryngeal  mobility;Reduced epiglottic inversion;Reduced laryngeal  elevation;Reduced airway/laryngeal closure;Reduced tongue base  retraction;Penetration/Aspiration after  swallow;Penetration/Aspiration during swallow;Trace  aspiration;Pharyngeal residue - valleculae;Pharyngeal residue -  cp segment;Pharyngeal residue - pyriform sinuses;Compensatory  strategies attempted (Comment) (head turn left - not effective) Penetration/Aspiration details (honey teaspoon): Material enters  airway, CONTACTS cords and not ejected out;Material enters  airway, passes BELOW cords without attempt by patient to eject  out (silent aspiration) Pharyngeal - Nectar Pharyngeal - Nectar Teaspoon: Reduced anterior laryngeal  mobility;Reduced epiglottic inversion;Reduced laryngeal  elevation;Reduced airway/laryngeal closure;Reduced tongue base  retraction;Penetration/Aspiration after  swallow;Penetration/Aspiration during swallow;Trace  aspiration;Pharyngeal residue - valleculae;Pharyngeal residue -  cp segment;Pharyngeal residue - pyriform sinuses;Compensatory  strategies attempted (Comment) Penetration/Aspiration details (nectar teaspoon): Material enters  airway, CONTACTS cords and not ejected out;Material enters  airway, passes BELOW cords without attempt by patient to eject  out (silent aspiration) Pharyngeal - Nectar Cup: Reduced anterior laryngeal  mobility;Reduced epiglottic inversion;Reduced laryngeal  elevation;Reduced airway/laryngeal closure;Reduced tongue base  retraction;Penetration/Aspiration after  swallow;Penetration/Aspiration during swallow;Trace  aspiration;Pharyngeal residue - valleculae;Pharyngeal residue -  cp segment;Pharyngeal residue - pyriform sinuses;Compensatory  strategies attempted (Comment) Penetration/Aspiration details (nectar cup): Material enters  airway,  CONTACTS cords and not ejected out;Material enters  airway, passes BELOW cords without attempt by patient to eject  out (silent aspiration) Pharyngeal - Thin Pharyngeal - Thin Teaspoon: Not tested (all spilled from mouth) Pharyngeal - Thin Straw: Reduced anterior laryngeal  mobility;Reduced epiglottic inversion;Reduced laryngeal  elevation;Reduced airway/laryngeal closure;Reduced tongue base  retraction;Penetration/Aspiration during swallow;Pharyngeal  residue - valleculae;Pharyngeal residue - cp segment;Pharyngeal  residue - pyriform sinuses;Compensatory strategies attempted  (Comment);Moderate aspiration Penetration/Aspiration details (thin straw): Material enters  airway, passes BELOW cords without attempt by patient to eject  out (silent aspiration) Pharyngeal - Solids Pharyngeal - Puree: Reduced anterior laryngeal mobility;Reduced  epiglottic inversion;Reduced laryngeal elevation;Reduced  airway/laryngeal closure;Reduced tongue base  retraction;Pharyngeal residue - valleculae;Pharyngeal residue -  cp segment;Pharyngeal residue - pyriform sinuses;Compensatory  strategies attempted (Comment) Penetration/Aspiration details (puree): Material does not enter  airway  Cervical Esophageal Phase    GO    Cervical Esophageal Phase Cervical Esophageal Phase: Impaired Cervical Esophageal Phase - Comment Cervical Esophageal Comment: see impression statement        Herbie [place], MA CCC-SLP [phone]  Katherene Ponto Deblois 01/21/2014, 10:56 AM     Medications: Scheduled Meds: . antiseptic oral rinse  15 mL Mouth Rinse BID  . aspirin  81 mg Oral Daily  . carvedilol  3.125 mg Oral BID WC  . darbepoetin (ARANESP) injection - NON-DIALYSIS  100 mcg Subcutaneous Q Mon-1800  . insulin aspart  0-24 Units Subcutaneous TID WC  . pantoprazole  40 mg Oral QAC breakfast      LOS: 24 days   Casha Estupinan M.D. [facility] 02/06/2014, 12:12 PM Pager: [phone]  If 7PM-7AM, please contact  night-coverage [email or web address] Password [email or web address]  **Disclaimer: This note was dictated with voice recognition software. Similar sounding words can inadvertently be transcribed and this note may contain transcription errors which may not have been corrected upon publication of note.**

## 2014-02-06 NOTE — Progress Notes (Signed)
Background: 73 year old male with past medical history of hypertension, diabetes who presented to the emergency room on 5/27 with complaints of nausea and vomiting x2 days. Found to be in severe acute renal failure with a creatinine of 9.36, potassium greater than 7.7 and hypotensive. Patient noted to be hypoxic, likely from aspiration pneumonia from vomiting with volume overload and was started on BiPAP. He was admitted to the critical care service. Patient was started on pressor support, CVVHD and was intubated.CRRT stopped 6/2 and transitioned to IHD.  He is currently making urine. Dialysis catheter accidentally removed by patient on 6/11 early morning. Catheter (right TDC) replaced  6/14 by interventional radiology. Once it is determined whether patient will need dialysis long-term, he will go to skilled nursing facility. Baseline creatinine done in Dr. Claris Gower' office 01/08/14 was 1.05 and 11/2011 was 1.12 so I am optimistic for recovery)  Assessment /Plans 1. AKI 2/2 ATN related to NSAID, ACEi, hypovolemia, PNA, Septic shock, CRRT 5/28-6/2, now getting prn HD. Found out from Dr. Arelia Sneddon' office today creatinine was 1.05 in May of this year so that makes me much more optimistic that he will recover. Creatinine rising in interdialytic interval (despite some UOP, no return of GFR).  Vol status good. Today's creatinine higher but volume and K good.  Am tentativle planning to watch through the weekend 2. Anemia - s/p PRBC's with HD 6/14; tsat low but ferritin of 2000 precludes use of Fe with HD; added darbe 100/week (6/15) 3. HTN 4. DM 5. S/p PNA (aspiration- completed ATB's) 6. Diastolic CHF 7. Atrial flutter - occurred on dialysis 6/16; has not had this issue prior. Responded to IV lopressor.  8. S/p NTEMI - demand ischemia in setting of septic shock on adm  Subjective: "I wish I could say I felt good, honey" Eating lunch Asking for Sprite  Objective: Vital signs in last 24 hours: Temp:   [98 F (36.7 C)-99 F (37.2 C)] 98 F (36.7 C) (06/20 0945) Pulse Rate:  [77-82] 81 (06/20 0945) Resp:  [16] 16 (06/20 0945) BP: (129-140)/(75-81) 140/75 mmHg (06/20 0945) SpO2:  [95 %] 95 % (06/20 0945) Weight:  [73.936 kg (163 lb)] 73.936 kg (163 lb) (06/20 0500) Weight change: -1.464 kg (-3 lb 3.6 oz)  Intake/Output from previous day: 06/19 0701 - 06/20 0700 In: 720 [P.O.:720] Out: 200 [Urine:200] Intake/Output this shift: Total I/O In: 120 [P.O.:120] Out: 100 [Urine:100] BP 140/75  Pulse 81  Temp(Src) 98 F (36.7 C) (Oral)  Resp 16  Ht 5\' 11"  (1.803 m)  Wt 73.936 kg (163 lb)  BMI 22.74 kg/m2  SpO2 95%  Physical examination VS as noted NAD Regular S1S2 No S3 Lungs grossly clear TDC dressing intact Abd soft and nontender No edema of the LE's   Recent Labs  02/04/14 0541 02/06/14 1031  WBC 10.1 11.2*  HGB 9.3* 8.8*  HCT 29.5* 28.0*  PLT 249 207    Recent Labs  02/05/14 0810 02/06/14 1024  NA 137 139  K 3.7 3.6*  CL 96 97  CO2 27 24  GLUCOSE 105* 177*  BUN 24* 39*  CREATININE 3.19* 4.53*  CALCIUM 8.1* 8.0*   Results for OSHER, OETTINGER (MRN [id number]) as of 02/02/2014 12:43  Ref. Range 02/02/2014 05:07  Iron Latest Range: 42-135 ug/dL 29 (L)  UIBC Latest Range: 125-400 ug/dL 164  TIBC Latest Range: 215-435 ug/dL 193 (L)  Saturation Ratios Latest Range: 20-55 % 15 (L)  Ferritin Latest Range: 22-322 ng/mL 2065 (H)  Scheduled Medications: . antiseptic oral rinse  15 mL Mouth Rinse BID  . aspirin  81 mg Oral Daily  . carvedilol  3.125 mg Oral BID WC  . darbepoetin (ARANESP) injection - NON-DIALYSIS  100 mcg Subcutaneous Q Mon-1800  . insulin aspart  0-24 Units Subcutaneous TID WC  . pantoprazole  40 mg Oral QAC breakfast    LOS: 24 days   DUNHAM,CYNTHIA B 02/06/2014,2:30 PM

## 2014-02-07 DIAGNOSIS — I359 Nonrheumatic aortic valve disorder, unspecified: Secondary | ICD-10-CM

## 2014-02-07 LAB — GLUCOSE, CAPILLARY
GLUCOSE-CAPILLARY: 126 mg/dL — AB (ref 70–99)
Glucose-Capillary: 105 mg/dL — ABNORMAL HIGH (ref 70–99)
Glucose-Capillary: 129 mg/dL — ABNORMAL HIGH (ref 70–99)

## 2014-02-07 LAB — RENAL FUNCTION PANEL
ALBUMIN: 2.3 g/dL — AB (ref 3.5–5.2)
BUN: 48 mg/dL — ABNORMAL HIGH (ref 6–23)
CO2: 24 mEq/L (ref 19–32)
Calcium: 8.2 mg/dL — ABNORMAL LOW (ref 8.4–10.5)
Chloride: 97 mEq/L (ref 96–112)
Creatinine, Ser: 5.22 mg/dL — ABNORMAL HIGH (ref 0.50–1.35)
GFR calc non Af Amer: 10 mL/min — ABNORMAL LOW (ref >90)
GFR, EST AFRICAN AMERICAN: 11 mL/min — AB (ref >90)
Glucose, Bld: 109 mg/dL — ABNORMAL HIGH (ref 70–99)
PHOSPHORUS: 5.5 mg/dL — AB (ref 2.3–4.6)
Potassium: 3.4 mEq/L — ABNORMAL LOW (ref 3.7–5.3)
SODIUM: 139 meq/L (ref 137–147)

## 2014-02-07 MED ORDER — CARVEDILOL 6.25 MG PO TABS
6.2500 mg | ORAL_TABLET | Freq: Two times a day (BID) | ORAL | Status: DC
  Administered 2014-02-07 – 2014-02-11 (×8): 6.25 mg via ORAL
  Filled 2014-02-07 (×10): qty 1

## 2014-02-07 MED ORDER — POTASSIUM CHLORIDE CRYS ER 20 MEQ PO TBCR
20.0000 meq | EXTENDED_RELEASE_TABLET | Freq: Once | ORAL | Status: AC
  Administered 2014-02-07: 20 meq via ORAL
  Filled 2014-02-07 (×2): qty 1

## 2014-02-07 NOTE — Progress Notes (Signed)
Patient ID: [name]  male  CHY:[id number]    DOB: 1940-10-19    DOA: 01/13/2014  PCP: No primary Jerrel Tiberio on file.  Assessment/Plan: Principal Problem:  Hypotensive/Septic shock, aspiration pneumonia: Resolved. Patient met criteria given hypotension, end organ damage and required pressor support. Source felt to be pneumonia secondary to aspiration.  - Completed antibiotic course, continue dysphagia 3 diet.   Active problems Acute renal failure now in the setting of chronic renal disease. Improving, Nephrology following. Baseline Cr 1.05 in May 2015 - Creatinine 4.5, HD 6/19 - Catheter replaced 6/14 - Renal recommendations reviewed, may need hemodialysis temporarily TTS until kidney function recover, monitor creatinine function over the next few days  Metabolic acidosis: Secondary to uremia. Initially from septic shock.   Hyperkalemia: Secondary renal failure. Initially treated with dialysis.  - Resolved  Thrombocytopenia: Resolved. Secondary to thrombocytopenia critical illness. Improving   Anemia: Secondary to chronic renal disease and illness.   Atrial flutter: On 6/16, Patient started developing episodes of significant tachycardia during dialysis. Rhythm assessment found to be aflutter.  - Currently stable  Diabetes mellitus:  - CBG stable. On sliding scale only. A1c 5.5.  - At this point, discontinue metformin permanently given renal failure and try to manage with dietary control only   Dysphagia: In part due to mentation. Initially at dysphagia 3 with nectar thick liquids. Upgraded by speech therapy to than likely this on 6/16. Patient has history of presbyesophagus. Speech therapy should follow even at skilled nursing facility   HTN: Slightly elevated. Started on beta blocker 6/13   Hyperlipidemia   Diastolic heart failure: Echocardiogram done in the setting of ?Non-STEMI. Grade 2 diastolic dysfunction noted.  NSTEMI: Demand ischemia in the setting of septic shock.  Resolved.   Toe ulceration: Patient was scheduled by orthopedic surgery to have planned left fourth toe amputation done 6/25. Hospitalist left message for need for postponement   DVT Prophylaxis:  Code Status:  Family Communication: Discussed with patient's wife in detail yesterday  Disposition: Will need skilled nursing facility  Consultants:   nephrology Critical Care  Interventional radiology  Speech therapy   Procedures:  Status post intubation on 5/28-extubated 6/1  Temporary dialysis catheter placed 5/28, patient removed it on 6/11  CVVH started 5/20  Echo 5/28 >> EF 65 to 87%, grade 2 diastolic dysfx, mod AS  Placement of dialysis catheter by interventional radiology on 6/14      Antibiotics:  Zosyn 5/27 >>> 6/03  Vancomycin 5/27 >>> 6/02  Subjective: Patient seen, sitting up in the chair, pleasant and cooperative, no acute issues  Objective: Weight change: 2.722 kg (6 lb)  Intake/Output Summary (Last 24 hours) at 02/07/14 1026 Last data filed at 02/06/14 2013  Gross per 24 hour  Intake    360 ml  Output    575 ml  Net   -215 ml   Blood pressure 163/91, pulse 84, temperature 98.4 F (36.9 C), temperature source Oral, resp. rate 18, height 5' 11"  (1.803 m), weight 76.658 kg (169 lb), SpO2 95.00%.  Physical Exam: General: Alert and awake, NAD oriented, frail . CVS: S1-S2 clear, 3/6 holosystolic murmur  Chest: CTAB Abdomen: soft nontender, nondistended, normal bowel sounds  Extremities: no cyanosis, clubbing or edema noted bilaterally   Lab Results: Basic Metabolic Panel:  Recent Labs Lab 02/06/14 1024 02/07/14 0505  NA 139 139  K 3.6* 3.4*  CL 97 97  CO2 24 24  GLUCOSE 177* 109*  BUN 39* 48*  CREATININE 4.53*  5.22*  CALCIUM 8.0* 8.2*  PHOS 4.8* 5.5*   Liver Function Tests:  Recent Labs Lab 02/06/14 1024 02/07/14 0505  ALBUMIN 2.2* 2.3*   No results found for this basename: LIPASE, AMYLASE,  in the last 168 hours No results  found for this basename: AMMONIA,  in the last 168 hours CBC:  Recent Labs Lab 02/04/14 0541 02/06/14 1031  WBC 10.1 11.2*  HGB 9.3* 8.8*  HCT 29.5* 28.0*  MCV 89.7 90.3  PLT 249 207   Cardiac Enzymes: No results found for this basename: CKTOTAL, CKMB, CKMBINDEX, TROPONINI,  in the last 168 hours BNP: No components found with this basename: POCBNP,  CBG:  Recent Labs Lab 02/06/14 0757 02/06/14 1155 02/06/14 1718 02/06/14 2008 02/07/14 0832  GLUCAP 117* 172* 107* 176* 105*     Micro Results: No results found for this or any previous visit (from the past 240 hour(s)).  Studies/Results: Dg Abd 1 View  01/23/2014   CLINICAL DATA:  Feeding tube placement.  EXAM: ABDOMEN - 1 VIEW  COMPARISON:  01/21/2014  FINDINGS: Feeding tube tip is seen with tip in the fundus of the stomach. Some residual contrast is seen within the colon. No dilated bowel loops seen.  IMPRESSION: Feeding tube tip in fundus of stomach.   Electronically Signed   By: Earle Gell M.D.   On: 01/23/2014 18:57   Dg Esophagus  01/26/2014   CLINICAL DATA:  Dysphagia. Modified swallow earlier in the morning only clearing patient for nectar thickness.  EXAM: ESOPHOGRAM/BARIUM SWALLOW  TECHNIQUE: Single contrast examination was performed using  thick barium.  FLUOROSCOPY TIME:  3 min and 20 is seconds  COMPARISON:  Chest radiograph 01/19/2014.  FINDINGS: Focused single-contrast exam was performed in a supine, mildly LPO position. The bed was maintained partially upright.  Evaluation of a single swallow demonstrates contrast stasis throughout the mid and upper thoracic esophagus.  Full column evaluation of the esophagus demonstrates no persistent narrowing or stricture.  13 mm barium tablet has initial delay in the vallecula. On subsequent swallows, the tablet passes into the esophagus and then into the stomach.  IMPRESSION: 1. Moderately degraded exam. 2. 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 3. No evidence of   stricture other anatomic cause for dysphagia. 4. Delayed passage of the barium tablet at the level of the vallecula.   Electronically Signed   By: Abigail Miyamoto M.D.   On: 01/26/2014 16:14   US Renal  01/14/2014   CLINICAL DATA:  Renal failure, history hypertension, diabetes  EXAM: RENAL/URINARY TRACT ULTRASOUND COMPLETE  COMPARISON:  None  FINDINGS: Right Kidney:  Length: 12.4 cm length. Cortical thickness up to 1.8 cm thick. Increased cortical echogenicity. Hypoechoic nodule medial aspect mid RIGHT kidney 2.1 x 2.2 x 2.1 cm containing scattered internal echoes question complicated cyst. No additional mass, hydronephrosis or shadowing calcification.  Left Kidney:  Length: 10.5 cm length. Cortical thickness up to 2.0 cm thick. Increased cortical echogenicity. Minimally complicated cyst at mid LEFT kidney 2.7 x 3.3 x 3.7 cm containing slightly irregular wall and scattered internal echoes. No additional mass, hydronephrosis or shadowing calcification.  Bladder:  Contains only minimal urine. Minimally enlarged central lobe of prostate.  IMPRESSION: Increased renal cortical echogenicity bilaterally consistent with medical renal disease.  Mildly complicated cysts within the mid portions of both kidneys.  No evidence of hydronephrosis.   Electronically Signed   By: Lavonia Dana M.D.   On: 01/14/2014 02:31   Ir Fluoro Guide Cv Line Right  01/31/2014   INDICATION: Renal failure and needs access for hemodialysis.  EXAM: FLUOROSCOPIC AND ULTRASOUND GUIDED PLACEMENT OF A TUNNELED DIALYSIS CATHETER  Physician: Stephan Minister. Anselm Pancoast, MD  FLUOROSCOPY TIME:  24 seconds  MEDICATIONS: 0.5 mg versed, 25 mcg fentanyl. A radiology nurse monitored the patient for moderate sedation.  ANESTHESIA/SEDATION: Moderate sedation time: 19 minutes  PROCEDURE: Informed consent was obtained for placement of a tunneled dialysis catheter. The patient was placed supine on the interventional table. Ultrasound confirmed a patent right internal jugularvein.  Ultrasound images were obtained for documentation. The right side of the neck was prepped and draped in a sterile fashion. The right side of the neck was anesthetized with 1% lidocaine. Maximal barrier sterile technique was utilized including caps, mask, sterile gowns, sterile gloves, sterile drape, hand hygiene and skin antiseptic. A small incision was made with #11 blade scalpel. A 21 gauge needle directed into the right internal jugular vein with ultrasound guidance. A micropuncture dilator set was placed. A 19 cm tip to cuff HemoSplit catheter was selected. The skin below the right clavicle was anesthetized and a small incision was made with an #11 blade scalpel. A subcutaneous tunnel was formed to the vein dermatotomy site. The catheter was brought through the tunnel. The vein dermatotomy site was dilated to accommodate a peel-away sheath. The catheter was placed through the peel-away sheath and directed into the central venous structures. The tip of the catheter was placed in the lower SVC with fluoroscopy. Fluoroscopic images were obtained for documentation. Both lumens were found to aspirate and flush well. The proper amount of heparin was flushed in both lumens. The vein dermatotomy site was closed using a single layer of absorbable suture and Dermabond. The catheter was secured to the skin using Prolene suture.  FINDINGS: Catheter tip in the lower SVC.  COMPLICATIONS: None  IMPRESSION: Successful placement of a right jugular tunneled dialysis catheter using ultrasound and fluoroscopic guidance.   Electronically Signed   By: Markus Daft M.D.   On: 01/31/2014 13:45   Ir US Guide Vasc Access Right  01/31/2014   INDICATION: Renal failure and needs access for hemodialysis.  EXAM: FLUOROSCOPIC AND ULTRASOUND GUIDED PLACEMENT OF A TUNNELED DIALYSIS CATHETER  Physician: Stephan Minister. Anselm Pancoast, MD  FLUOROSCOPY TIME:  24 seconds  MEDICATIONS: 0.5 mg versed, 25 mcg fentanyl. A radiology nurse monitored the patient for  moderate sedation.  ANESTHESIA/SEDATION: Moderate sedation time: 19 minutes  PROCEDURE: Informed consent was obtained for placement of a tunneled dialysis catheter. The patient was placed supine on the interventional table. Ultrasound confirmed a patent right internal jugularvein. Ultrasound images were obtained for documentation. The right side of the neck was prepped and draped in a sterile fashion. The right side of the neck was anesthetized with 1% lidocaine. Maximal barrier sterile technique was utilized including caps, mask, sterile gowns, sterile gloves, sterile drape, hand hygiene and skin antiseptic. A small incision was made with #11 blade scalpel. A 21 gauge needle directed into the right internal jugular vein with ultrasound guidance. A micropuncture dilator set was placed. A 19 cm tip to cuff HemoSplit catheter was selected. The skin below the right clavicle was anesthetized and a small incision was made with an #11 blade scalpel. A subcutaneous tunnel was formed to the vein dermatotomy site. The catheter was brought through the tunnel. The vein dermatotomy site was dilated to accommodate a peel-away sheath. The catheter was placed through the peel-away sheath and directed into the central venous structures. The tip of  the catheter was placed in the lower SVC with fluoroscopy. Fluoroscopic images were obtained for documentation. Both lumens were found to aspirate and flush well. The proper amount of heparin was flushed in both lumens. The vein dermatotomy site was closed using a single layer of absorbable suture and Dermabond. The catheter was secured to the skin using Prolene suture.  FINDINGS: Catheter tip in the lower SVC.  COMPLICATIONS: None  IMPRESSION: Successful placement of a right jugular tunneled dialysis catheter using ultrasound and fluoroscopic guidance.   Electronically Signed   By: Markus Daft M.D.   On: 01/31/2014 13:45   Dg Chest Port 1 View  01/19/2014   CLINICAL DATA:  Cough  EXAM:  PORTABLE CHEST - 1 VIEW  COMPARISON:  01/18/2014  FINDINGS: Cardiac shadow is stable. A right jugular line and left jugular temporary dialysis catheter are again seen and stable. The endotracheal tube and nasogastric catheter have been removed. The lungs are well aerated bilaterally. Improved aeration is again seen bilaterally with persistent changes in the right lung base. No new focal infiltrate is seen.  IMPRESSION: Improved aeration with some persistent changes in the right base.  Tubes and lines as described.   Electronically Signed   By: Inez Catalina M.D.   On: 01/19/2014 06:53   Dg Chest Port 1 View  01/18/2014   CLINICAL DATA:  Check endotracheal tube position.  EXAM: PORTABLE CHEST - 1 VIEW  COMPARISON:  01/16/2014.  FINDINGS: Endotracheal tube terminates approximately 3.1 cm above the carina. Nasogastric tube is followed into the stomach. Right IJ central line tip projects over the low SVC. Left IJ catheter tip projects over the brachiocephalic vein confluence or upper SVC. Heart is enlarged, stable. Thoracic aorta is calcified. There is mild-to-moderate diffuse bilateral airspace disease with slight improvement in aeration at the right lung base. No definite pleural fluid.  IMPRESSION: Mild to moderate diffuse bilateral airspace disease, likely due to pulmonary edema. Slight improvement in aeration at the right lung base.   Electronically Signed   By: Lorin Picket M.D.   On: 01/18/2014 07:40   Dg Chest Port 1 View  01/16/2014   CLINICAL DATA:  Aspiration pneumonia  EXAM: PORTABLE CHEST - 1 VIEW  COMPARISON:  01/15/2014  FINDINGS: Endotracheal tube ends in the mid thoracic trachea. Unchanged positioning of bilateral IJ catheters, tips at the level of the SVC. Enteric tube enters the stomach.  Bibasilar airspace disease shows no progression or clearing. No evidence of increasing pleural fluid or air leak.  IMPRESSION: 1. Stable positioning of tubes and lines. 2. Unchanged bibasilar pneumonia.    Electronically Signed   By: Jorje Guild M.D.   On: 01/16/2014 07:18   Dg Chest Port 1 View  01/15/2014   CLINICAL DATA:  Ventilator support.  Pneumonia.  EXAM: PORTABLE CHEST - 1 VIEW  COMPARISON:  01/14/2014  FINDINGS: Endotracheal tube has its tip 4 cm above the carina. Nasogastric tube enters the stomach. Right internal jugular central line has its tip in the SVC above the right atrium. Left internal jugular central line tips are in the SVC above the right atrium. Bilateral lower lobe airspace filling consistent with pneumonia persists, probably slightly improved since yesterday. The upper lungs remain clear. No effusions.  IMPRESSION: Lines and tubes well positioned. Slight improvement in bilateral lower lobe pneumonia.   Electronically Signed   By: Nelson Chimes M.D.   On: 01/15/2014 07:35   Dg Chest Port 1 View  01/14/2014   CLINICAL DATA:  Status post central line placement  EXAM: PORTABLE CHEST - 1 VIEW  COMPARISON:  01/14/2014 225 hr  FINDINGS: A left jugular dual-lumen central line is again identified and stable. A new right jugular central line is seen with the catheter tip at the cavoatrial junction. No pneumothorax is noted. The endotracheal tube is seen 5 cm above the carina. A nasogastric catheter is noted extending into the stomach. Bilateral lower lung infiltrates are seen. The overall appearance is stable. No new focal abnormality is noted.  IMPRESSION: Stable bilateral infiltrates.  New tubes and lines as described above without complicating factors.   Electronically Signed   By: Inez Catalina M.D.   On: 01/14/2014 09:33   Dg Chest Port 1 View  01/14/2014   CLINICAL DATA:  Hemodialysis catheter placement  EXAM: Portable exam 0225 hr compared to 01/13/2014  COMPARISON:  None.  FINDINGS: LEFT jugular dual-lumen central venous catheter with tip projecting over mid SVC.  Enlargement of cardiac silhouette with pulmonary vascular congestion.  Perihilar and basilar infiltrates which could  represent edema or infection.  No pneumothorax.  Bones unremarkable.  IMPRESSION: No pneumothorax following LEFT jugular line placement.  Enlargement of cardiac silhouette with pulmonary vascular congestion and diffuse increased pulmonary infiltrates, question edema versus infection.   Electronically Signed   By: Lavonia Dana M.D.   On: 01/14/2014 02:41   Dg Chest Port 1 View  01/13/2014   CLINICAL DATA:  73 year old male with back and knee pain. Shortness of Breath. Initial encounter.  EXAM: PORTABLE CHEST - 1 VIEW  COMPARISON:  11/21/2007.  FINDINGS: Portable AP supine view at 2211 hrs. New confluent bibasilar pulmonary opacity. No pneumothorax. Pulmonary vascularity within normal limits. Stable cardiac size and mediastinal contours. Visualized tracheal air column is within normal limits.  IMPRESSION: New confluent bibasilar pulmonary opacity, nonspecific but consider bilateral pneumonia or aspiration.   Electronically Signed   By: Lars Pinks M.D.   On: 01/13/2014 23:02   Dg Abd Portable 1v  01/23/2014   CLINICAL DATA:  Feeding tube placement.  EXAM: PORTABLE ABDOMEN - 1 VIEW  COMPARISON:  01/23/2014  FINDINGS: The Dobbhoff feeding tube is looped back on itself in the descending duodenum. The tip is in the antropyloric region of the stomach.  IMPRESSION: Dobbhoff feeding tube is looped back on itself in the descending duodenum.   Electronically Signed   By: Kalman Jewels M.D.   On: 01/23/2014 23:44   Dg Abd Portable 1v  01/21/2014   CLINICAL DATA:  Feeding tube placement.  EXAM: PORTABLE ABDOMEN - 1 VIEW  COMPARISON:  None.  FINDINGS: Weighted tip feeding tube is present in the proximal gastric fundus. Barium is present in small bowel. The bowel gas pattern is nonobstructive. Gaseous distention of the colon.  IMPRESSION: Weighted tip feeding tube in the proximal gastric fundus.   Electronically Signed   By: Dereck Ligas M.D.   On: 01/21/2014 16:32   Dg Swallowing Func-speech Pathology  02/02/2014    Katherene Ponto Deblois, CCC-SLP     02/02/2014 10:53 AM Objective Swallowing Evaluation: Modified Barium Swallowing Study   Patient Details  Name: [name] MRN: [id number] Date of Birth: 17-Sep-1940  Today's Date: 02/02/2014 Time: 1000-1030 SLP Time Calculation (min): 30 min  Past Medical History:  Past Medical History  Diagnosis Date  . Diabetes mellitus without complication   . Hypertension   . Hypercholesteremia    Past Surgical History:  Past Surgical History  Procedure Laterality Date  . Prostatectomy    .  Colon resection     HPI:  73 year old male with HTN and DM presented 5/27 to ED c/o N/V  since 5/25. In ED found to be in acute renal failure,  hyperkalemic, hypotensive with SBP's in 70s. Started on BiPAP for  hypoxia and increased WOB. Intubated from 5/28 to 6/1. Pt with  ICU delirium. Admission Chest CXR says New confluent bibasilar  pulmonary opacity, nonspecific but consider bilateral pneumonia  or aspiration. Pt has undergone multiple MBS studies with  yesterday's study showing improved swallow function to allow po  intake with strict precautions.       Assessment / Plan / Recommendation Clinical Impression  Dysphagia Diagnosis: Moderate pharyngeal phase  dysphagia;Moderate cervical esophageal phase dysphagia Clinical impression: Pt demonstrates swallow function that is  likely his baseline. Strength has continued to improve with  minimal oropharygneal residuals even with solid textures. 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 Pt  consistently has trace silent penetration to the cords with small  to normal sips of thin liquids. He had aspiration when trying to  swallow too quickly or tilting his head dramatically back. If pt  clears his throat every 2-3 sips he can expel trace penetrates.  Again this function appears to be pts baseline. Recommend a dys 3  (mechanical soft) diet with thin liquids and an  intermittent  throat clear with ongoing therapy to address compensatory  strategies.     Treatment Recommendation  Therapy as outlined in treatment plan below    Diet Recommendation Dysphagia 3 (Mechanical Soft);Thin liquid   Liquid Administration via: Cup;No straw Medication Administration: Whole meds with puree Supervision: Patient able to self feed;Full supervision/cueing  for compensatory strategies Compensations: Slow rate;Small sips/bites;Clear throat  intermittently;Follow solids with liquid Postural Changes and/or Swallow Maneuvers: Seated upright 90  degrees;Upright 30-60 min after meal;Out of bed for meals    Other  Recommendations Oral Care Recommendations: Oral care BID   Follow Up Recommendations  Inpatient Rehab    Frequency and Duration min 2x/week  2 weeks   Pertinent Vitals/Pain NA    SLP Swallow Goals     General HPI: 73 year old male with HTN and DM presented 5/27 to  ED c/o N/V since 5/25. In ED found to be in acute renal failure,  hyperkalemic, hypotensive with SBP's in 70s. Started on BiPAP for  hypoxia and increased WOB. Intubated from 5/28 to 6/1. Pt with  ICU delirium. Admission Chest CXR says New confluent bibasilar  pulmonary opacity, nonspecific but consider bilateral pneumonia  or aspiration. Pt has undergone multiple MBS studies with  yesterday's study showing improved swallow function to allow po  intake with strict precautions.   Type of Study: Modified Barium Swallowing Study Reason for Referral: Objectively evaluate swallowing function Diet Prior to this Study: Dysphagia 3 (soft);Nectar-thick liquids Temperature Spikes Noted: No Respiratory Status: Room air History of Recent Intubation: Yes Length of Intubations (days): 5 days Date extubated: 01/18/14 Behavior/Cognition: Alert;Cooperative;Pleasant mood Oral Cavity - Dentition: Dentures, top;Dentures, bottom Oral Motor / Sensory Function: Within functional limits Self-Feeding Abilities: Able to feed self Patient Positioning: Upright  in chair Baseline Vocal Quality: Clear Volitional Cough: Strong Volitional Swallow: Able to elicit Anatomy: Other (Comment) (bony protrustion at C5/6, Prominent CP,  no radiologist pres) Pharyngeal Secretions: Not observed secondary MBS    Reason for Referral Objectively evaluate swallowing function   Oral Phase Oral Preparation/Oral Phase Oral Phase: WFL Oral - Honey  Oral - Honey Teaspoon: Not tested Oral - Nectar Oral - Nectar Teaspoon: Not tested Oral - Nectar Cup: Not tested Oral - Thin Oral - Thin Teaspoon: Not tested Oral - Thin Straw: Within functional limits (with a chin tuck) Oral - Solids Oral - Puree: Within functional limits   Pharyngeal Phase Pharyngeal Phase Pharyngeal Phase: Impaired Pharyngeal - Honey Pharyngeal - Honey Teaspoon: Not tested Pharyngeal - Nectar Pharyngeal - Nectar Teaspoon: Not tested Pharyngeal - Nectar Cup: Not tested Pharyngeal - Nectar Straw: Not tested Pharyngeal - Thin Pharyngeal - Thin Teaspoon: Not tested Pharyngeal - Thin Cup: Delayed swallow initiation;Reduced  laryngeal elevation;Reduced airway/laryngeal  closure;Penetration/Aspiration before swallow;Trace aspiration Penetration/Aspiration details (thin cup): Material enters  airway, CONTACTS cords and not ejected out;Material enters  airway, passes BELOW cords without attempt by patient to eject  out (silent aspiration) Pharyngeal - Thin Straw: Delayed swallow initiation;Reduced  laryngeal elevation;Reduced airway/laryngeal  closure;Penetration/Aspiration before swallow;Trace aspiration Penetration/Aspiration details (thin straw): Material enters  airway, CONTACTS cords and not ejected out Pharyngeal - Solids Pharyngeal - Puree: Pharyngeal residue - valleculae;Reduced  laryngeal elevation;Reduced epiglottic inversion Penetration/Aspiration details (puree): Material does not enter  airway Pharyngeal - Regular: Pharyngeal residue - valleculae;Reduced  laryngeal elevation;Reduced epiglottic inversion  Cervical Esophageal  Phase    GO    Cervical Esophageal Phase Cervical Esophageal Phase: Impaired Cervical Esophageal Phase - Comment Cervical Esophageal Comment: see impression statement        Herbie Baltimore, MA CCC-SLP [phone]  Lynann Beaver 02/02/2014, 10:51 AM    Dg Swallowing Func-speech Pathology  01/26/2014   Katherene Ponto Deblois, CCC-SLP     01/26/2014 12:49 PM Objective Swallowing Evaluation: Modified Barium Swallowing Study   Patient Details  Name: [name] MRN: [id number] Date of Birth: 12-Jan-1941  Today's Date: 01/26/2014 Time: 8182-9937 SLP Time Calculation (min): 19 min  Past Medical History:  Past Medical History  Diagnosis Date  . Diabetes mellitus without complication   . Hypertension   . Hypercholesteremia    Past Surgical History:  Past Surgical History  Procedure Laterality Date  . Prostatectomy    . Colon resection     HPI:  73 year old male with HTN and DM presented 5/27 to ED c/o N/V  since 5/25. In ED found to be in acute renal failure,  hyperkalemic, hypotensive with SBP's in 70s. Started on BiPAP for  hypoxia and increased WOB. Intubated from 5/28 to 6/1. Pt with  ICU delirium. Admission Chest CXR says New confluent bibasilar  pulmonary opacity, nonspecific but consider bilateral pneumonia  or aspiration.      Assessment / Plan / Recommendation Clinical Impression  Dysphagia Diagnosis: Mild cervical esophageal phase  dysphagia;Suspected primary esophageal dysphagia;Mild pharyngeal  phase dysphagia Clinical impression: Overall strength and timeliness of swallow  has significantly improved though mild to moderate dyspahgia  persists. Reduced hyoid excursion and base of tongue retraction  leads to mild to moderate vallecular residuals that, with thin  liquids, are silently penetrated/aspirated after the swallow.  Quantity and severity of penetration with nectar thick liquids is  trace and is easily cleared with occasional throat clear. The  primary problem is likely more related to cervical  esophageal and  possibly esopahgeal function. There is a prominent CP, reduced  opening of UES and pt was noted to have significant esophageal  stasis building up to proximal esophagus with one instance of  backflow to pharynx. Pt is recommended to consume a dys 3  (mechanical soft) diet with nectar thick liquids,  intermittent  throat clear and esophageal precautions. Discussed concerns with  MD. Kerby Moors diet will reduce risk, but still pt may aspirate  esophageal backflow.     Treatment Recommendation  Therapy as outlined in treatment plan below    Diet Recommendation Dysphagia 3 (Mechanical Soft);Nectar-thick  liquid   Liquid Administration via: Cup;Straw Medication Administration: Crushed with puree Supervision: Patient able to self feed;Full supervision/cueing  for compensatory strategies Compensations: Slow rate;Small sips/bites;Clear throat  intermittently;Follow solids with liquid Postural Changes and/or Swallow Maneuvers: Seated upright 90  degrees;Upright 30-60 min after meal;Out of bed for meals    Other  Recommendations Oral Care Recommendations: Oral care BID Other Recommendations: Order thickener from pharmacy   Follow Up Recommendations  Inpatient Rehab    Frequency and Duration min 2x/week  2 weeks   Pertinent Vitals/Pain NA    SLP Swallow Goals     General HPI: 73 year old male with HTN and DM presented 5/27 to  ED c/o N/V since 5/25. In ED found to be in acute renal failure,  hyperkalemic, hypotensive with SBP's in 70s. Started on BiPAP for  hypoxia and increased WOB. Intubated from 5/28 to 6/1. Pt with  ICU delirium. Admission Chest CXR says New confluent bibasilar  pulmonary opacity, nonspecific but consider bilateral pneumonia  or aspiration.  Type of Study: Modified Barium Swallowing Study Reason for Referral: Objectively evaluate swallowing function Previous Swallow Assessment: MBS 01/21/14 - NPO Diet Prior to this Study: NPO;Panda Temperature Spikes Noted: No Respiratory Status: Room air History  of Recent Intubation: Yes Length of Intubations (days): 5 days Date extubated: 01/18/14 Behavior/Cognition: Alert;Cooperative;Requires  cueing;Distractible Oral Cavity - Dentition: Dentures, top;Dentures, bottom Oral Motor / Sensory Function: Impaired - see Bedside swallow  eval Self-Feeding Abilities: Able to feed self Patient Positioning: Upright in chair Baseline Vocal Quality: Clear Volitional Cough: Strong Volitional Swallow: Able to elicit Anatomy: Other (Comment) (Protrusion at c5/6, prominent CP) Pharyngeal Secretions: Not observed secondary MBS    Reason for Referral Objectively evaluate swallowing function   Oral Phase Oral Preparation/Oral Phase Oral Phase: WFL   Pharyngeal Phase Pharyngeal Phase Pharyngeal Phase: Impaired Pharyngeal - Honey Pharyngeal - Honey Teaspoon: Reduced epiglottic inversion;Reduced  anterior laryngeal mobility;Reduced laryngeal  elevation;Pharyngeal residue - valleculae;Pharyngeal residue - cp  segment Penetration/Aspiration details (honey teaspoon): Material does  not enter airway Pharyngeal - Nectar Pharyngeal - Nectar Teaspoon: Reduced epiglottic  inversion;Reduced anterior laryngeal mobility;Reduced laryngeal  elevation;Pharyngeal residue - valleculae;Pharyngeal residue - cp  segment Penetration/Aspiration details (nectar teaspoon): Material does  not enter airway Pharyngeal - Nectar Cup: Reduced epiglottic inversion;Reduced  anterior laryngeal mobility;Reduced laryngeal  elevation;Pharyngeal residue - valleculae;Pharyngeal residue - cp  segment;Penetration/Aspiration after swallow Penetration/Aspiration details (nectar cup): Material enters  airway, remains ABOVE vocal cords and not ejected out;Material  does not enter airway Pharyngeal - Nectar Straw: Reduced epiglottic inversion;Reduced  anterior laryngeal mobility;Reduced laryngeal  elevation;Pharyngeal residue - valleculae;Pharyngeal residue - cp  segment;Penetration/Aspiration after swallow Penetration/Aspiration details  (nectar straw): Material enters  airway, remains ABOVE vocal cords and not ejected out;Material  does not enter airway Pharyngeal - Thin Pharyngeal - Thin Teaspoon: Not tested Pharyngeal - Thin Cup: Reduced epiglottic inversion;Reduced  anterior laryngeal mobility;Reduced laryngeal  elevation;Pharyngeal residue - valleculae;Pharyngeal residue - cp  segment;Penetration/Aspiration after swallow;Trace aspiration Penetration/Aspiration details (thin cup): Material enters  airway, passes BELOW cords without attempt by patient to eject  out (silent aspiration);Material enters airway, CONTACTS cords  and not ejected out Pharyngeal - Thin Straw: Not tested Pharyngeal - Solids Pharyngeal -  Puree: Reduced epiglottic inversion;Reduced anterior  laryngeal mobility;Reduced laryngeal elevation;Pharyngeal residue  - valleculae;Pharyngeal residue - cp segment Penetration/Aspiration details (puree): Material does not enter  airway Pharyngeal - Regular: Reduced epiglottic inversion;Reduced  anterior laryngeal mobility;Reduced laryngeal  elevation;Pharyngeal residue - valleculae;Pharyngeal residue - cp  segment Pharyngeal - Pill: Not tested  Cervical Esophageal Phase    GO    Cervical Esophageal Phase Cervical Esophageal Phase: Impaired Cervical Esophageal Phase - Comment Cervical Esophageal Comment: see impression statement        Herbie Baltimore, MA CCC-SLP [phone]  Katherene Ponto Deblois 01/26/2014, 12:48 PM    Dg Swallowing Func-speech Pathology  01/21/2014   Katherene Ponto Deblois, CCC-SLP     01/21/2014 10:58 AM Objective Swallowing Evaluation: Modified Barium Swallowing Study   Patient Details  Name: [name] MRN: [id number] Date of Birth: 07/29/1941  Today's Date: 01/21/2014 Time: 0930-1000 SLP Time Calculation (min): 30 min  Past Medical History:  Past Medical History  Diagnosis Date  . Diabetes mellitus without complication   . Hypertension   . Hypercholesteremia    Past Surgical History:  Past Surgical History  Procedure  Laterality Date  . Prostatectomy    . Colon resection     HPI:  73 year old male with HTN and DM presented 5/27 to ED c/o N/V  since 5/25. In ED found to be in acute renal failure,  hyperkalemic, hypotensive with SBP's in 70s. Started on BiPAP for  hypoxia and increased WOB. Intubated from 5/28 to 6/1. Pt with  ICU delirium. Admission Chest CXR says New confluent bibasilar  pulmonary opacity, nonspecific but consider bilateral pneumonia  or aspiration.      Assessment / Plan / Recommendation Clinical Impression  Dysphagia Diagnosis: Moderate cervical esophageal phase  dysphagia;Severe pharyngeal phase dysphagia;Mild oral phase  dysphagia Clinical impression: Pt demonstrates a primary structural  cervical esophageal dysphagia, present at baseline, that is  likely worsed by pts mental status and generalized weakness. Pt  was awake, but required max cues to keep head up, bring cup to  lips and orally accept POs. Oral phase characterized by slow  transit. Pt noted to have the appearance of bony protrusion at  C5/6 and a prominent cricopharyngeus (no radiologist present to  confirm) as well as reduced laryngeal elevation and hyoid  excursion. These deficits result in incomplete airway closure  with silent aspiration during the swallow and residuals over the  CP segment and in the valleculae that are repentrated after the  swallow even if pt cued to clear his throat. Findings consistent  with possible admission with aspiration pna. Pt would be at high  risk of aspiration at this time and would benefit from several  days of recovery prior to retesting and attempting POs. Improved  mental status and general strength would be indicators for  retest. Recommend NPO with short term alternate nutrition. SLP  will revisit pt on Monday for possiblity of retest.     Treatment Recommendation  Therapy as outlined in treatment plan below    Diet Recommendation NPO;Alternative means - temporary   Medication Administration: Via  alternative means    Other  Recommendations Oral Care Recommendations: Oral care Q4  per protocol   Follow Up Recommendations  Skilled Nursing facility    Frequency and Duration min 3x week  2 weeks   Pertinent Vitals/Pain NA    SLP Swallow Goals     General HPI: 73 year old male with HTN and DM presented 5/27 to  ED  c/o N/V since 5/25. In ED found to be in acute renal failure,  hyperkalemic, hypotensive with SBP's in 70s. Started on BiPAP for  hypoxia and increased WOB. Intubated from 5/28 to 6/1. Pt with  ICU delirium. Admission Chest CXR says New confluent bibasilar  pulmonary opacity, nonspecific but consider bilateral pneumonia  or aspiration.  Type of Study: Modified Barium Swallowing Study Reason for Referral: Objectively evaluate swallowing function Previous Swallow Assessment: none in chart Diet Prior to this Study: NPO Temperature Spikes Noted: No Respiratory Status: Nasal cannula History of Recent Intubation: Yes Length of Intubations (days): 5 days Date extubated: 01/18/14 Behavior/Cognition: Alert;Cooperative;Requires  cueing;Distractible Oral Cavity - Dentition: Dentures, top;Dentures, bottom Oral Motor / Sensory Function: Impaired - see Bedside swallow  eval Self-Feeding Abilities: Able to feed self;Needs assist Patient Positioning: Upright in chair Baseline Vocal Quality: Low vocal intensity;Wet Volitional Cough: Strong Volitional Swallow: Able to elicit Anatomy:  (bony protrustion at C5/6, Prominent CP, no radiologist  pres.) Pharyngeal Secretions:  (suspect standing secretions)    Reason for Referral Objectively evaluate swallowing function   Oral Phase Oral Preparation/Oral Phase Oral Phase: Impaired Oral - Honey Oral - Honey Teaspoon: Delayed oral transit Oral - Nectar Oral - Nectar Teaspoon: Delayed oral transit Oral - Nectar Cup: Delayed oral transit Oral - Thin Oral - Thin Teaspoon: Right anterior bolus loss;Left anterior  bolus loss;Delayed oral transit Oral - Thin Straw: Delayed oral transit  (pt struggles to tip head  and cup back for thin cup) Oral - Solids Oral - Puree: Within functional limits   Pharyngeal Phase Pharyngeal Phase Pharyngeal Phase: Impaired Pharyngeal - Honey Pharyngeal - Honey Teaspoon: Reduced anterior laryngeal  mobility;Reduced epiglottic inversion;Reduced laryngeal  elevation;Reduced airway/laryngeal closure;Reduced tongue base  retraction;Penetration/Aspiration after  swallow;Penetration/Aspiration during swallow;Trace  aspiration;Pharyngeal residue - valleculae;Pharyngeal residue -  cp segment;Pharyngeal residue - pyriform sinuses;Compensatory  strategies attempted (Comment) (head turn left - not effective) Penetration/Aspiration details (honey teaspoon): Material enters  airway, CONTACTS cords and not ejected out;Material enters  airway, passes BELOW cords without attempt by patient to eject  out (silent aspiration) Pharyngeal - Nectar Pharyngeal - Nectar Teaspoon: Reduced anterior laryngeal  mobility;Reduced epiglottic inversion;Reduced laryngeal  elevation;Reduced airway/laryngeal closure;Reduced tongue base  retraction;Penetration/Aspiration after  swallow;Penetration/Aspiration during swallow;Trace  aspiration;Pharyngeal residue - valleculae;Pharyngeal residue -  cp segment;Pharyngeal residue - pyriform sinuses;Compensatory  strategies attempted (Comment) Penetration/Aspiration details (nectar teaspoon): Material enters  airway, CONTACTS cords and not ejected out;Material enters  airway, passes BELOW cords without attempt by patient to eject  out (silent aspiration) Pharyngeal - Nectar Cup: Reduced anterior laryngeal  mobility;Reduced epiglottic inversion;Reduced laryngeal  elevation;Reduced airway/laryngeal closure;Reduced tongue base  retraction;Penetration/Aspiration after  swallow;Penetration/Aspiration during swallow;Trace  aspiration;Pharyngeal residue - valleculae;Pharyngeal residue -  cp segment;Pharyngeal residue - pyriform sinuses;Compensatory  strategies  attempted (Comment) Penetration/Aspiration details (nectar cup): Material enters  airway, CONTACTS cords and not ejected out;Material enters  airway, passes BELOW cords without attempt by patient to eject  out (silent aspiration) Pharyngeal - Thin Pharyngeal - Thin Teaspoon: Not tested (all spilled from mouth) Pharyngeal - Thin Straw: Reduced anterior laryngeal  mobility;Reduced epiglottic inversion;Reduced laryngeal  elevation;Reduced airway/laryngeal closure;Reduced tongue base  retraction;Penetration/Aspiration during swallow;Pharyngeal  residue - valleculae;Pharyngeal residue - cp segment;Pharyngeal  residue - pyriform sinuses;Compensatory strategies attempted  (Comment);Moderate aspiration Penetration/Aspiration details (thin straw): Material enters  airway, passes BELOW cords without attempt by patient to eject  out (silent aspiration) Pharyngeal - Solids Pharyngeal - Puree: Reduced anterior laryngeal mobility;Reduced  epiglottic inversion;Reduced laryngeal elevation;Reduced  airway/laryngeal closure;Reduced tongue  base  retraction;Pharyngeal residue - valleculae;Pharyngeal residue -  cp segment;Pharyngeal residue - pyriform sinuses;Compensatory  strategies attempted (Comment) Penetration/Aspiration details (puree): Material does not enter  airway  Cervical Esophageal Phase    GO    Cervical Esophageal Phase Cervical Esophageal Phase: Impaired Cervical Esophageal Phase - Comment Cervical Esophageal Comment: see impression statement        Herbie [place], MA CCC-SLP [phone]  Katherene Ponto Deblois 01/21/2014, 10:56 AM     Medications: Scheduled Meds: . antiseptic oral rinse  15 mL Mouth Rinse BID  . aspirin  81 mg Oral Daily  . carvedilol  6.25 mg Oral BID WC  . darbepoetin (ARANESP) injection - NON-DIALYSIS  100 mcg Subcutaneous Q Mon-1800  . insulin aspart  0-24 Units Subcutaneous TID WC  . pantoprazole  40 mg Oral QAC breakfast      LOS: 25 days   RAI,RIPUDEEP M.D. [facility] 02/07/2014, 10:26 AM Pager: [phone]  If 7PM-7AM, please contact night-coverage [email or web address] Password [email or web address]  **Disclaimer: This note was dictated with voice recognition software. Similar sounding words can inadvertently be transcribed and this note may contain transcription errors which may not have been corrected upon publication of note.**

## 2014-02-07 NOTE — Progress Notes (Signed)
Background: 73 year old male with past medical history of hypertension, diabetes who presented to the ED on 5/27 with nausea and vomiting x2 days. Found to be in severe acute renal failure with a creatinine of 9.36, potassium greater than 7.7, hypotensive, hypoxic, likely from aspiration pneumonia from vomiting with volume overload and started on BiPAP. Admitted to the critical care service, started on pressor support, CVVHD and intubated. CRRT stopped 6/2 and transitioned to IHD.  He is currently making urine. Dialysis catheter accidentally removed by pt  6/11. Right TDC) replaced  6/14 by interventional radiology. Once it is determined whether patient will need dialysis long-term, he will go to skilled nursing facility. Baseline creatinine done in Dr. Claris Gower' office 01/08/14 was 1.05 (and 11/2011 was 1.12) so I am much more optimistic for recovery)  Assessment /Plans 1. AKI 2/2 ATN related to NSAID, ACEi, hypovolemia, PNA, Septic shock, CRRT 5/28-6/2, now getting prn HD. Found out from Dr. Arelia Sneddon' office Friday that  creatinine was 1.05 in May of this year so that makes me much more optimistic that he will recover. Creatinine rising in interdialytic interval (despite fair UOP, no return of GFR yet).  Last HD was 6/18. Vol status good. Today's creatinine yet higher but volume and K remain good.   Continue to watch through the weekend without HD, reassess on Monday and dialyze prn pending recovery 2. Anemia - s/p PRBC's with HD 6/14; tsat low but ferritin of 2000 precludes use of Fe with HD. Added darbe 100/week [facility]  (6/15) 3. HTN - meds; BP a little high. Increase carvedilol to 6.25 BID 4. DM - SSI 5. S/p PNA (aspiration- completed ATB's) 6. Diastolic CHF - stable and not in CHF now. 7. Atrial flutter - occurred on dialysis 6/16; has not had this issue prior. Responded to IV lopressor.  8. S/p NTEMI - demand ischemia in setting of septic shock on adm  Subjective: Feels better today Denies pain or  SOB Looks less depressed  Objective: Vital signs in last 24 hours: Temp:  [98 F (36.7 C)-98.5 F (36.9 C)] 98.4 F (36.9 C) (06/21 0446) Pulse Rate:  [80-85] 84 (06/21 0446) Resp:  [16-18] 18 (06/21 0446) BP: (133-163)/(64-91) 163/91 mmHg (06/21 0446) SpO2:  [95 %-96 %] 95 % (06/21 0446) Weight:  [76.658 kg (169 lb)] 76.658 kg (169 lb) (06/21 0446) Weight change: 2.722 kg (6 lb)  Intake/Output from previous day: 06/20 0701 - 06/21 0700 In: 480 [P.O.:480] Out: 675 [Urine:675] Intake/Output this shift:   BP 163/91  Pulse 84  Temp(Src) 98.4 F (36.9 C) (Oral)  Resp 18  Ht 5\' 11"  (1.803 m)  Wt 76.658 kg (169 lb)  BMI 23.58 kg/m2  SpO2 95%  Physical examination VS as noted NAD Regular S1S2 No S3 Lungs grossly clear TDC dressing intact right chest Abd soft and nontender No edema whatsoever of the LE's   Recent Labs  02/06/14 1031  WBC 11.2*  HGB 8.8*  HCT 28.0*  PLT 207    Recent Labs  02/06/14 1024 02/07/14 0505  NA 139 139  K 3.6* 3.4*  CL 97 97  CO2 24 24  GLUCOSE 177* 109*  BUN 39* 48*  CREATININE 4.53* 5.22*  CALCIUM 8.0* 8.2*  Phos 5.5  Results for KEYLIN, PODOLSKY (MRN [id number]) as of 02/02/2014 12:43  Ref. Range 02/02/2014 05:07  Iron Latest Range: 42-135 ug/dL 29 (L)  UIBC Latest Range: 125-400 ug/dL 164  TIBC Latest Range: 215-435 ug/dL 193 (L)  Saturation Ratios Latest  Range: 20-55 % 15 (L)  Ferritin Latest Range: 22-322 ng/mL 2065 (H)   Scheduled Medications: . antiseptic oral rinse  15 mL Mouth Rinse BID  . aspirin  81 mg Oral Daily  . carvedilol  3.125 mg Oral BID WC  . darbepoetin (ARANESP) injection - NON-DIALYSIS  100 mcg Subcutaneous Q Mon-1800  . insulin aspart  0-24 Units Subcutaneous TID WC  . pantoprazole  40 mg Oral QAC breakfast    LOS: 25 days   DUNHAM,CYNTHIA B 02/07/2014,8:10 AM

## 2014-02-08 LAB — RENAL FUNCTION PANEL
ALBUMIN: 2.2 g/dL — AB (ref 3.5–5.2)
BUN: 53 mg/dL — ABNORMAL HIGH (ref 6–23)
CHLORIDE: 98 meq/L (ref 96–112)
CO2: 24 mEq/L (ref 19–32)
Calcium: 8.3 mg/dL — ABNORMAL LOW (ref 8.4–10.5)
Creatinine, Ser: 5.2 mg/dL — ABNORMAL HIGH (ref 0.50–1.35)
GFR calc Af Amer: 12 mL/min — ABNORMAL LOW (ref >90)
GFR, EST NON AFRICAN AMERICAN: 10 mL/min — AB (ref >90)
Glucose, Bld: 108 mg/dL — ABNORMAL HIGH (ref 70–99)
Phosphorus: 5.1 mg/dL — ABNORMAL HIGH (ref 2.3–4.6)
Potassium: 3.8 mEq/L (ref 3.7–5.3)
Sodium: 139 mEq/L (ref 137–147)

## 2014-02-08 LAB — CBC
HEMATOCRIT: 27.7 % — AB (ref 39.0–52.0)
Hemoglobin: 8.8 g/dL — ABNORMAL LOW (ref 13.0–17.0)
MCH: 28.6 pg (ref 26.0–34.0)
MCHC: 31.8 g/dL (ref 30.0–36.0)
MCV: 89.9 fL (ref 78.0–100.0)
Platelets: 222 10*3/uL (ref 150–400)
RBC: 3.08 MIL/uL — ABNORMAL LOW (ref 4.22–5.81)
RDW: 15.1 % (ref 11.5–15.5)
WBC: 11.5 10*3/uL — ABNORMAL HIGH (ref 4.0–10.5)

## 2014-02-08 LAB — GLUCOSE, CAPILLARY
Glucose-Capillary: 145 mg/dL — ABNORMAL HIGH (ref 70–99)
Glucose-Capillary: 90 mg/dL (ref 70–99)
Glucose-Capillary: 181 mg/dL — ABNORMAL HIGH (ref 70–99)
Glucose-Capillary: 94 mg/dL (ref 70–99)

## 2014-02-08 MED ORDER — DARBEPOETIN ALFA-POLYSORBATE 100 MCG/0.5ML IJ SOLN
100.0000 ug | INTRAMUSCULAR | Status: DC
  Filled 2014-02-08: qty 0.5

## 2014-02-08 NOTE — Progress Notes (Signed)
Patient ID: [name]  male  JGO:[id number]    DOB: [birth year]    DOA: 01/13/2014  PCP: No primary provider on file.  Assessment/Plan: Principal Problem:  Hypotensive/Septic shock, aspiration pneumonia: Resolved. Patient met criteria given hypotension, end organ damage and required pressor support. Source felt to be pneumonia secondary to aspiration.  - Completed antibiotic course, continue dysphagia 3 diet.   Active problems Acute renal failure now in the setting of chronic renal disease. Improving, Nephrology following. Baseline Cr 1.05 in May 2015 - Creatinine 5.2, HD 6/19, Catheter replaced 6/14 - Renal recommendations reviewed, may need hemodialysis temporarily TTS until kidney function recover, monitor creatinine function over the next few days  Metabolic acidosis: Secondary to uremia. Initially from septic shock.   Hyperkalemia: Secondary renal failure. Initially treated with dialysis.  - Resolved  Thrombocytopenia: Resolved. Secondary to thrombocytopenia critical illness. Improving   Anemia: Secondary to chronic renal disease and illness.   Atrial flutter: On 6/16, Patient started developing episodes of significant tachycardia during dialysis. Rhythm assessment found to be aflutter.  - Currently stable, on Coreg  Diabetes mellitus:  - CBG stable. On sliding scale only. A1c 5.5.  - At this point, discontinue metformin permanently given renal failure and try to manage with dietary control only   Dysphagia: In part due to mentation. Initially at dysphagia 3 with nectar thick liquids. Upgraded by speech therapy to than likely this on 6/16. Patient has history of presbyesophagus. Speech therapy should follow even at skilled nursing facility   HTN: Stable  Hyperlipidemia   Diastolic heart failure: Echocardiogram done in the setting of ?Non-STEMI. Grade 2 diastolic dysfunction noted.  NSTEMI: Demand ischemia in the setting of septic shock. Resolved.   Toe ulceration:  Patient was scheduled by orthopedic surgery to have planned left fourth toe amputation done 6/25. Hospitalist left message for need for postponement   DVT Prophylaxis:  Code Status:  Family Communication: Discussed with patient's wife in detail  At the bedside  Disposition: Awaiting final renal recommendations, Will need skilled nursing facility for rehabilitation  Consultants:   nephrology Critical Care  Interventional radiology  Speech therapy   Procedures:  Status post intubation on 5/28-extubated 6/1  Temporary dialysis catheter placed 5/28, patient removed it on 6/11  CVVH started 5/20  Echo 5/28 >> EF 65 to 55%, grade 2 diastolic dysfx, mod AS  Placement of dialysis catheter by interventional radiology on 6/14      Antibiotics:  Zosyn 5/27 >>> 6/03  Vancomycin 5/27 >>> 6/02  Subjective: Patient seen, sitting up in the chair, pleasant and cooperative, no acute issues  Objective: Weight change: 0.242 kg (8.5 oz)  Intake/Output Summary (Last 24 hours) at 02/08/14 1143 Last data filed at 02/07/14 2258  Gross per 24 hour  Intake    480 ml  Output    800 ml  Net   -320 ml   Blood pressure 127/78, pulse 76, temperature 98.4 F (36.9 C), temperature source Oral, resp. rate 17, height 5' 11"  (1.803 m), weight 76.9 kg (169 lb 8.5 oz), SpO2 98.00%.  Physical Exam: General: Alert and awake, NAD oriented, frail . CVS: S1-S2 clear, 3/6 holosystolic murmur  Chest: CTAB Abdomen: soft nontender, nondistended, normal bowel sounds  Extremities: no cyanosis, clubbing or edema noted bilaterally   Lab Results: Basic Metabolic Panel:  Recent Labs Lab 02/07/14 0505 02/08/14 0350  NA 139 139  K 3.4* 3.8  CL 97 98  CO2 24 24  GLUCOSE 109* 108*  BUN  48* 53*  CREATININE 5.22* 5.20*  CALCIUM 8.2* 8.3*  PHOS 5.5* 5.1*   Liver Function Tests:  Recent Labs Lab 02/07/14 0505 02/08/14 0350  ALBUMIN 2.3* 2.2*   No results found for this basename: LIPASE, AMYLASE,   in the last 168 hours No results found for this basename: AMMONIA,  in the last 168 hours CBC:  Recent Labs Lab 02/06/14 1031 02/08/14 0350  WBC 11.2* 11.5*  HGB 8.8* 8.8*  HCT 28.0* 27.7*  MCV 90.3 89.9  PLT 207 222   Cardiac Enzymes: No results found for this basename: CKTOTAL, CKMB, CKMBINDEX, TROPONINI,  in the last 168 hours BNP: No components found with this basename: POCBNP,  CBG:  Recent Labs Lab 02/07/14 0832 02/07/14 1220 02/07/14 1700 02/07/14 2203 02/08/14 0729  GLUCAP 105* 129* 126* 145* 90     Micro Results: No results found for this or any previous visit (from the past 240 hour(s)).  Studies/Results: Dg Abd 1 View  01/23/2014   CLINICAL DATA:  Feeding tube placement.  EXAM: ABDOMEN - 1 VIEW  COMPARISON:  01/21/2014  FINDINGS: Feeding tube tip is seen with tip in the fundus of the stomach. Some residual contrast is seen within the colon. No dilated bowel loops seen.  IMPRESSION: Feeding tube tip in fundus of stomach.   Electronically Signed   By: Earle Gell M.D.   On: 01/23/2014 18:57   Dg Esophagus  01/26/2014   CLINICAL DATA:  Dysphagia. Modified swallow earlier in the morning only clearing patient for nectar thickness.  EXAM: ESOPHOGRAM/BARIUM SWALLOW  TECHNIQUE: Single contrast examination was performed using  thick barium.  FLUOROSCOPY TIME:  3 min and 20 is seconds  COMPARISON:  Chest radiograph 01/19/2014.  FINDINGS: Focused single-contrast exam was performed in a supine, mildly LPO position. The bed was maintained partially upright.  Evaluation of a single swallow demonstrates contrast stasis throughout the mid and upper thoracic esophagus.  Full column evaluation of the esophagus demonstrates no persistent narrowing or stricture.  13 mm barium tablet has initial delay in the vallecula. On subsequent swallows, the tablet passes into the esophagus and then into the stomach.  IMPRESSION: 1. Moderately degraded exam. 2. 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 3. No evidence of  stricture other anatomic cause for dysphagia. 4. Delayed passage of the barium tablet at the level of the vallecula.   Electronically Signed   By: Abigail Miyamoto M.D.   On: 01/26/2014 16:14   US Renal  01/14/2014   CLINICAL DATA:  Renal failure, history hypertension, diabetes  EXAM: RENAL/URINARY TRACT ULTRASOUND COMPLETE  COMPARISON:  None  FINDINGS: Right Kidney:  Length: 12.4 cm length. Cortical thickness up to 1.8 cm thick. Increased cortical echogenicity. Hypoechoic nodule medial aspect mid RIGHT kidney 2.1 x 2.2 x 2.1 cm containing scattered internal echoes question complicated cyst. No additional mass, hydronephrosis or shadowing calcification.  Left Kidney:  Length: 10.5 cm length. Cortical thickness up to 2.0 cm thick. Increased cortical echogenicity. Minimally complicated cyst at mid LEFT kidney 2.7 x 3.3 x 3.7 cm containing slightly irregular wall and scattered internal echoes. No additional mass, hydronephrosis or shadowing calcification.  Bladder:  Contains only minimal urine. Minimally enlarged central lobe of prostate.  IMPRESSION: Increased renal cortical echogenicity bilaterally consistent with medical renal disease.  Mildly complicated cysts within the mid portions of both kidneys.  No evidence of hydronephrosis.   Electronically Signed   By: Lavonia Dana M.D.   On: 01/14/2014 02:31   Ir  Fluoro Guide Cv Line Right  01/31/2014   INDICATION: Renal failure and needs access for hemodialysis.  EXAM: FLUOROSCOPIC AND ULTRASOUND GUIDED PLACEMENT OF A TUNNELED DIALYSIS CATHETER  Physician: Stephan Minister. Anselm Pancoast, MD  FLUOROSCOPY TIME:  24 seconds  MEDICATIONS: 0.5 mg versed, 25 mcg fentanyl. A radiology nurse monitored the patient for moderate sedation.  ANESTHESIA/SEDATION: Moderate sedation time: 19 minutes  PROCEDURE: Informed consent was obtained for placement of a tunneled dialysis catheter. The patient was placed supine on the interventional table. Ultrasound confirmed a  patent right internal jugularvein. Ultrasound images were obtained for documentation. The right side of the neck was prepped and draped in a sterile fashion. The right side of the neck was anesthetized with 1% lidocaine. Maximal barrier sterile technique was utilized including caps, mask, sterile gowns, sterile gloves, sterile drape, hand hygiene and skin antiseptic. A small incision was made with #11 blade scalpel. A 21 gauge needle directed into the right internal jugular vein with ultrasound guidance. A micropuncture dilator set was placed. A 19 cm tip to cuff HemoSplit catheter was selected. The skin below the right clavicle was anesthetized and a small incision was made with an #11 blade scalpel. A subcutaneous tunnel was formed to the vein dermatotomy site. The catheter was brought through the tunnel. The vein dermatotomy site was dilated to accommodate a peel-away sheath. The catheter was placed through the peel-away sheath and directed into the central venous structures. The tip of the catheter was placed in the lower SVC with fluoroscopy. Fluoroscopic images were obtained for documentation. Both lumens were found to aspirate and flush well. The proper amount of heparin was flushed in both lumens. The vein dermatotomy site was closed using a single layer of absorbable suture and Dermabond. The catheter was secured to the skin using Prolene suture.  FINDINGS: Catheter tip in the lower SVC.  COMPLICATIONS: None  IMPRESSION: Successful placement of a right jugular tunneled dialysis catheter using ultrasound and fluoroscopic guidance.   Electronically Signed   By: Markus Daft M.D.   On: 01/31/2014 13:45   Ir US Guide Vasc Access Right  01/31/2014   INDICATION: Renal failure and needs access for hemodialysis.  EXAM: FLUOROSCOPIC AND ULTRASOUND GUIDED PLACEMENT OF A TUNNELED DIALYSIS CATHETER  Physician: Stephan Minister. Anselm Pancoast, MD  FLUOROSCOPY TIME:  24 seconds  MEDICATIONS: 0.5 mg versed, 25 mcg fentanyl. A radiology  nurse monitored the patient for moderate sedation.  ANESTHESIA/SEDATION: Moderate sedation time: 19 minutes  PROCEDURE: Informed consent was obtained for placement of a tunneled dialysis catheter. The patient was placed supine on the interventional table. Ultrasound confirmed a patent right internal jugularvein. Ultrasound images were obtained for documentation. The right side of the neck was prepped and draped in a sterile fashion. The right side of the neck was anesthetized with 1% lidocaine. Maximal barrier sterile technique was utilized including caps, mask, sterile gowns, sterile gloves, sterile drape, hand hygiene and skin antiseptic. A small incision was made with #11 blade scalpel. A 21 gauge needle directed into the right internal jugular vein with ultrasound guidance. A micropuncture dilator set was placed. A 19 cm tip to cuff HemoSplit catheter was selected. The skin below the right clavicle was anesthetized and a small incision was made with an #11 blade scalpel. A subcutaneous tunnel was formed to the vein dermatotomy site. The catheter was brought through the tunnel. The vein dermatotomy site was dilated to accommodate a peel-away sheath. The catheter was placed through the peel-away sheath and directed into the  central venous structures. The tip of the catheter was placed in the lower SVC with fluoroscopy. Fluoroscopic images were obtained for documentation. Both lumens were found to aspirate and flush well. The proper amount of heparin was flushed in both lumens. The vein dermatotomy site was closed using a single layer of absorbable suture and Dermabond. The catheter was secured to the skin using Prolene suture.  FINDINGS: Catheter tip in the lower SVC.  COMPLICATIONS: None  IMPRESSION: Successful placement of a right jugular tunneled dialysis catheter using ultrasound and fluoroscopic guidance.   Electronically Signed   By: Markus Daft M.D.   On: 01/31/2014 13:45   Dg Chest Port 1 View  01/19/2014    CLINICAL DATA:  Cough  EXAM: PORTABLE CHEST - 1 VIEW  COMPARISON:  01/18/2014  FINDINGS: Cardiac shadow is stable. A right jugular line and left jugular temporary dialysis catheter are again seen and stable. The endotracheal tube and nasogastric catheter have been removed. The lungs are well aerated bilaterally. Improved aeration is again seen bilaterally with persistent changes in the right lung base. No new focal infiltrate is seen.  IMPRESSION: Improved aeration with some persistent changes in the right base.  Tubes and lines as described.   Electronically Signed   By: Inez Catalina M.D.   On: 01/19/2014 06:53   Dg Chest Port 1 View  01/18/2014   CLINICAL DATA:  Check endotracheal tube position.  EXAM: PORTABLE CHEST - 1 VIEW  COMPARISON:  01/16/2014.  FINDINGS: Endotracheal tube terminates approximately 3.1 cm above the carina. Nasogastric tube is followed into the stomach. Right IJ central line tip projects over the low SVC. Left IJ catheter tip projects over the brachiocephalic vein confluence or upper SVC. Heart is enlarged, stable. Thoracic aorta is calcified. There is mild-to-moderate diffuse bilateral airspace disease with slight improvement in aeration at the right lung base. No definite pleural fluid.  IMPRESSION: Mild to moderate diffuse bilateral airspace disease, likely due to pulmonary edema. Slight improvement in aeration at the right lung base.   Electronically Signed   By: Lorin Picket M.D.   On: 01/18/2014 07:40   Dg Chest Port 1 View  01/16/2014   CLINICAL DATA:  Aspiration pneumonia  EXAM: PORTABLE CHEST - 1 VIEW  COMPARISON:  01/15/2014  FINDINGS: Endotracheal tube ends in the mid thoracic trachea. Unchanged positioning of bilateral IJ catheters, tips at the level of the SVC. Enteric tube enters the stomach.  Bibasilar airspace disease shows no progression or clearing. No evidence of increasing pleural fluid or air leak.  IMPRESSION: 1. Stable positioning of tubes and lines. 2.  Unchanged bibasilar pneumonia.   Electronically Signed   By: Jorje Guild M.D.   On: 01/16/2014 07:18   Dg Chest Port 1 View  01/15/2014   CLINICAL DATA:  Ventilator support.  Pneumonia.  EXAM: PORTABLE CHEST - 1 VIEW  COMPARISON:  01/14/2014  FINDINGS: Endotracheal tube has its tip 4 cm above the carina. Nasogastric tube enters the stomach. Right internal jugular central line has its tip in the SVC above the right atrium. Left internal jugular central line tips are in the SVC above the right atrium. Bilateral lower lobe airspace filling consistent with pneumonia persists, probably slightly improved since yesterday. The upper lungs remain clear. No effusions.  IMPRESSION: Lines and tubes well positioned. Slight improvement in bilateral lower lobe pneumonia.   Electronically Signed   By: Nelson Chimes M.D.   On: 01/15/2014 07:35   Dg Chest [facility]  01/14/2014   CLINICAL DATA:  Status post central line placement  EXAM: PORTABLE CHEST - 1 VIEW  COMPARISON:  01/14/2014 225 hr  FINDINGS: A left jugular dual-lumen central line is again identified and stable. A new right jugular central line is seen with the catheter tip at the cavoatrial junction. No pneumothorax is noted. The endotracheal tube is seen 5 cm above the carina. A nasogastric catheter is noted extending into the stomach. Bilateral lower lung infiltrates are seen. The overall appearance is stable. No new focal abnormality is noted.  IMPRESSION: Stable bilateral infiltrates.  New tubes and lines as described above without complicating factors.   Electronically Signed   By: Inez Catalina M.D.   On: 01/14/2014 09:33   Dg Chest Port 1 View  01/14/2014   CLINICAL DATA:  Hemodialysis catheter placement  EXAM: Portable exam 0225 hr compared to 01/13/2014  COMPARISON:  None.  FINDINGS: LEFT jugular dual-lumen central venous catheter with tip projecting over mid SVC.  Enlargement of cardiac silhouette with pulmonary vascular congestion.  Perihilar and  basilar infiltrates which could represent edema or infection.  No pneumothorax.  Bones unremarkable.  IMPRESSION: No pneumothorax following LEFT jugular line placement.  Enlargement of cardiac silhouette with pulmonary vascular congestion and diffuse increased pulmonary infiltrates, question edema versus infection.   Electronically Signed   By: Lavonia Dana M.D.   On: 01/14/2014 02:41   Dg Chest Port 1 View  01/13/2014   CLINICAL DATA:  73 year old male with back and knee pain. Shortness of Breath. Initial encounter.  EXAM: PORTABLE CHEST - 1 VIEW  COMPARISON:  11/21/2007.  FINDINGS: Portable AP supine view at 2211 hrs. New confluent bibasilar pulmonary opacity. No pneumothorax. Pulmonary vascularity within normal limits. Stable cardiac size and mediastinal contours. Visualized tracheal air column is within normal limits.  IMPRESSION: New confluent bibasilar pulmonary opacity, nonspecific but consider bilateral pneumonia or aspiration.   Electronically Signed   By: Lars Pinks M.D.   On: 01/13/2014 23:02   Dg Abd Portable 1v  01/23/2014   CLINICAL DATA:  Feeding tube placement.  EXAM: PORTABLE ABDOMEN - 1 VIEW  COMPARISON:  01/23/2014  FINDINGS: The Dobbhoff feeding tube is looped back on itself in the descending duodenum. The tip is in the antropyloric region of the stomach.  IMPRESSION: Dobbhoff feeding tube is looped back on itself in the descending duodenum.   Electronically Signed   By: Kalman Jewels M.D.   On: 01/23/2014 23:44   Dg Abd Portable 1v  01/21/2014   CLINICAL DATA:  Feeding tube placement.  EXAM: PORTABLE ABDOMEN - 1 VIEW  COMPARISON:  None.  FINDINGS: Weighted tip feeding tube is present in the proximal gastric fundus. Barium is present in small bowel. The bowel gas pattern is nonobstructive. Gaseous distention of the colon.  IMPRESSION: Weighted tip feeding tube in the proximal gastric fundus.   Electronically Signed   By: Dereck Ligas M.D.   On: 01/21/2014 16:32   Dg Swallowing  Func-speech Pathology  02/02/2014   Katherene Ponto Deblois, CCC-SLP     02/02/2014 10:53 AM Objective Swallowing Evaluation: Modified Barium Swallowing Study   Patient Details  Name: [name] MRN: [id number] Date of Birth: 1940/10/18  Today's Date: 02/02/2014 Time: 1000-1030 SLP Time Calculation (min): 30 min  Past Medical History:  Past Medical History  Diagnosis Date  . Diabetes mellitus without complication   . Hypertension   . Hypercholesteremia    Past Surgical History:  Past Surgical History  Procedure  Laterality Date  . Prostatectomy    . Colon resection     HPI:  73 year old male with HTN and DM presented 5/27 to ED c/o N/V  since 5/25. In ED found to be in acute renal failure,  hyperkalemic, hypotensive with SBP's in 70s. Started on BiPAP for  hypoxia and increased WOB. Intubated from 5/28 to 6/1. Pt with  ICU delirium. Admission Chest CXR says New confluent bibasilar  pulmonary opacity, nonspecific but consider bilateral pneumonia  or aspiration. Pt has undergone multiple MBS studies with  yesterday's study showing improved swallow function to allow po  intake with strict precautions.       Assessment / Plan / Recommendation Clinical Impression  Dysphagia Diagnosis: Moderate pharyngeal phase  dysphagia;Moderate cervical esophageal phase dysphagia Clinical impression: Pt demonstrates swallow function that is  likely his baseline. Strength has continued to improve with  minimal oropharygneal residuals even with solid textures. 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 Pt  consistently has trace silent penetration to the cords with small  to normal sips of thin liquids. He had aspiration when trying to  swallow too quickly or tilting his head dramatically back. If pt  clears his throat every 2-3 sips he can expel trace penetrates.  Again this function appears to be pts baseline. Recommend a dys 3  (mechanical soft)  diet with thin liquids and an intermittent  throat clear with ongoing therapy to address compensatory  strategies.     Treatment Recommendation  Therapy as outlined in treatment plan below    Diet Recommendation Dysphagia 3 (Mechanical Soft);Thin liquid   Liquid Administration via: Cup;No straw Medication Administration: Whole meds with puree Supervision: Patient able to self feed;Full supervision/cueing  for compensatory strategies Compensations: Slow rate;Small sips/bites;Clear throat  intermittently;Follow solids with liquid Postural Changes and/or Swallow Maneuvers: Seated upright 90  degrees;Upright 30-60 min after meal;Out of bed for meals    Other  Recommendations Oral Care Recommendations: Oral care BID   Follow Up Recommendations  Inpatient Rehab    Frequency and Duration min 2x/week  2 weeks   Pertinent Vitals/Pain NA    SLP Swallow Goals     General HPI: 73 year old male with HTN and DM presented 5/27 to  ED c/o N/V since 5/25. In ED found to be in acute renal failure,  hyperkalemic, hypotensive with SBP's in 70s. Started on BiPAP for  hypoxia and increased WOB. Intubated from 5/28 to 6/1. Pt with  ICU delirium. Admission Chest CXR says New confluent bibasilar  pulmonary opacity, nonspecific but consider bilateral pneumonia  or aspiration. Pt has undergone multiple MBS studies with  yesterday's study showing improved swallow function to allow po  intake with strict precautions.   Type of Study: Modified Barium Swallowing Study Reason for Referral: Objectively evaluate swallowing function Diet Prior to this Study: Dysphagia 3 (soft);Nectar-thick liquids Temperature Spikes Noted: No Respiratory Status: Room air History of Recent Intubation: Yes Length of Intubations (days): 5 days Date extubated: 01/18/14 Behavior/Cognition: Alert;Cooperative;Pleasant mood Oral Cavity - Dentition: Dentures, top;Dentures, bottom Oral Motor / Sensory Function: Within functional limits Self-Feeding Abilities: Able to feed self  Patient Positioning: Upright in chair Baseline Vocal Quality: Clear Volitional Cough: Strong Volitional Swallow: Able to elicit Anatomy: Other (Comment) (bony protrustion at C5/6, Prominent CP,  no radiologist pres) Pharyngeal Secretions: Not observed secondary MBS    Reason for Referral Objectively evaluate swallowing function   Oral Phase  Oral Preparation/Oral Phase Oral Phase: WFL Oral - Honey Oral - Honey Teaspoon: Not tested Oral - Nectar Oral - Nectar Teaspoon: Not tested Oral - Nectar Cup: Not tested Oral - Thin Oral - Thin Teaspoon: Not tested Oral - Thin Straw: Within functional limits (with a chin tuck) Oral - Solids Oral - Puree: Within functional limits   Pharyngeal Phase Pharyngeal Phase Pharyngeal Phase: Impaired Pharyngeal - Honey Pharyngeal - Honey Teaspoon: Not tested Pharyngeal - Nectar Pharyngeal - Nectar Teaspoon: Not tested Pharyngeal - Nectar Cup: Not tested Pharyngeal - Nectar Straw: Not tested Pharyngeal - Thin Pharyngeal - Thin Teaspoon: Not tested Pharyngeal - Thin Cup: Delayed swallow initiation;Reduced  laryngeal elevation;Reduced airway/laryngeal  closure;Penetration/Aspiration before swallow;Trace aspiration Penetration/Aspiration details (thin cup): Material enters  airway, CONTACTS cords and not ejected out;Material enters  airway, passes BELOW cords without attempt by patient to eject  out (silent aspiration) Pharyngeal - Thin Straw: Delayed swallow initiation;Reduced  laryngeal elevation;Reduced airway/laryngeal  closure;Penetration/Aspiration before swallow;Trace aspiration Penetration/Aspiration details (thin straw): Material enters  airway, CONTACTS cords and not ejected out Pharyngeal - Solids Pharyngeal - Puree: Pharyngeal residue - valleculae;Reduced  laryngeal elevation;Reduced epiglottic inversion Penetration/Aspiration details (puree): Material does not enter  airway Pharyngeal - Regular: Pharyngeal residue - valleculae;Reduced  laryngeal elevation;Reduced epiglottic  inversion  Cervical Esophageal Phase    GO    Cervical Esophageal Phase Cervical Esophageal Phase: Impaired Cervical Esophageal Phase - Comment Cervical Esophageal Comment: see impression statement        Herbie Baltimore, MA CCC-SLP [phone]  Lynann Beaver 02/02/2014, 10:51 AM    Dg Swallowing Func-speech Pathology  01/26/2014   Katherene Ponto Deblois, CCC-SLP     01/26/2014 12:49 PM Objective Swallowing Evaluation: Modified Barium Swallowing Study   Patient Details  Name: [name] MRN: [id number] Date of Birth: 22-Dec-1940  Today's Date: 01/26/2014 Time: 7342-8768 SLP Time Calculation (min): 19 min  Past Medical History:  Past Medical History  Diagnosis Date  . Diabetes mellitus without complication   . Hypertension   . Hypercholesteremia    Past Surgical History:  Past Surgical History  Procedure Laterality Date  . Prostatectomy    . Colon resection     HPI:  73 year old male with HTN and DM presented 5/27 to ED c/o N/V  since 5/25. In ED found to be in acute renal failure,  hyperkalemic, hypotensive with SBP's in 70s. Started on BiPAP for  hypoxia and increased WOB. Intubated from 5/28 to 6/1. Pt with  ICU delirium. Admission Chest CXR says New confluent bibasilar  pulmonary opacity, nonspecific but consider bilateral pneumonia  or aspiration.      Assessment / Plan / Recommendation Clinical Impression  Dysphagia Diagnosis: Mild cervical esophageal phase  dysphagia;Suspected primary esophageal dysphagia;Mild pharyngeal  phase dysphagia Clinical impression: Overall strength and timeliness of swallow  has significantly improved though mild to moderate dyspahgia  persists. Reduced hyoid excursion and base of tongue retraction  leads to mild to moderate vallecular residuals that, with thin  liquids, are silently penetrated/aspirated after the swallow.  Quantity and severity of penetration with nectar thick liquids is  trace and is easily cleared with occasional throat clear. The  primary problem is likely  more related to cervical esophageal and  possibly esopahgeal function. There is a prominent CP, reduced  opening of UES and pt was noted to have significant esophageal  stasis building up to proximal esophagus with one instance of  backflow to pharynx. Pt is recommended to consume a dys  3  (mechanical soft) diet with nectar thick liquids, intermittent  throat clear and esophageal precautions. Discussed concerns with  MD. Kerby Moors diet will reduce risk, but still pt may aspirate  esophageal backflow.     Treatment Recommendation  Therapy as outlined in treatment plan below    Diet Recommendation Dysphagia 3 (Mechanical Soft);Nectar-thick  liquid   Liquid Administration via: Cup;Straw Medication Administration: Crushed with puree Supervision: Patient able to self feed;Full supervision/cueing  for compensatory strategies Compensations: Slow rate;Small sips/bites;Clear throat  intermittently;Follow solids with liquid Postural Changes and/or Swallow Maneuvers: Seated upright 90  degrees;Upright 30-60 min after meal;Out of bed for meals    Other  Recommendations Oral Care Recommendations: Oral care BID Other Recommendations: Order thickener from pharmacy   Follow Up Recommendations  Inpatient Rehab    Frequency and Duration min 2x/week  2 weeks   Pertinent Vitals/Pain NA    SLP Swallow Goals     General HPI: 73 year old male with HTN and DM presented 5/27 to  ED c/o N/V since 5/25. In ED found to be in acute renal failure,  hyperkalemic, hypotensive with SBP's in 70s. Started on BiPAP for  hypoxia and increased WOB. Intubated from 5/28 to 6/1. Pt with  ICU delirium. Admission Chest CXR says New confluent bibasilar  pulmonary opacity, nonspecific but consider bilateral pneumonia  or aspiration.  Type of Study: Modified Barium Swallowing Study Reason for Referral: Objectively evaluate swallowing function Previous Swallow Assessment: MBS 01/21/14 - NPO Diet Prior to this Study: NPO;Panda Temperature Spikes Noted: No Respiratory  Status: Room air History of Recent Intubation: Yes Length of Intubations (days): 5 days Date extubated: 01/18/14 Behavior/Cognition: Alert;Cooperative;Requires  cueing;Distractible Oral Cavity - Dentition: Dentures, top;Dentures, bottom Oral Motor / Sensory Function: Impaired - see Bedside swallow  eval Self-Feeding Abilities: Able to feed self Patient Positioning: Upright in chair Baseline Vocal Quality: Clear Volitional Cough: Strong Volitional Swallow: Able to elicit Anatomy: Other (Comment) (Protrusion at c5/6, prominent CP) Pharyngeal Secretions: Not observed secondary MBS    Reason for Referral Objectively evaluate swallowing function   Oral Phase Oral Preparation/Oral Phase Oral Phase: WFL   Pharyngeal Phase Pharyngeal Phase Pharyngeal Phase: Impaired Pharyngeal - Honey Pharyngeal - Honey Teaspoon: Reduced epiglottic inversion;Reduced  anterior laryngeal mobility;Reduced laryngeal  elevation;Pharyngeal residue - valleculae;Pharyngeal residue - cp  segment Penetration/Aspiration details (honey teaspoon): Material does  not enter airway Pharyngeal - Nectar Pharyngeal - Nectar Teaspoon: Reduced epiglottic  inversion;Reduced anterior laryngeal mobility;Reduced laryngeal  elevation;Pharyngeal residue - valleculae;Pharyngeal residue - cp  segment Penetration/Aspiration details (nectar teaspoon): Material does  not enter airway Pharyngeal - Nectar Cup: Reduced epiglottic inversion;Reduced  anterior laryngeal mobility;Reduced laryngeal  elevation;Pharyngeal residue - valleculae;Pharyngeal residue - cp  segment;Penetration/Aspiration after swallow Penetration/Aspiration details (nectar cup): Material enters  airway, remains ABOVE vocal cords and not ejected out;Material  does not enter airway Pharyngeal - Nectar Straw: Reduced epiglottic inversion;Reduced  anterior laryngeal mobility;Reduced laryngeal  elevation;Pharyngeal residue - valleculae;Pharyngeal residue - cp  segment;Penetration/Aspiration after swallow  Penetration/Aspiration details (nectar straw): Material enters  airway, remains ABOVE vocal cords and not ejected out;Material  does not enter airway Pharyngeal - Thin Pharyngeal - Thin Teaspoon: Not tested Pharyngeal - Thin Cup: Reduced epiglottic inversion;Reduced  anterior laryngeal mobility;Reduced laryngeal  elevation;Pharyngeal residue - valleculae;Pharyngeal residue - cp  segment;Penetration/Aspiration after swallow;Trace aspiration Penetration/Aspiration details (thin cup): Material enters  airway, passes BELOW cords without attempt by patient to eject  out (silent aspiration);Material enters airway, CONTACTS cords  and not ejected out Pharyngeal -  Thin Straw: Not tested Pharyngeal - Solids Pharyngeal - Puree: Reduced epiglottic inversion;Reduced anterior  laryngeal mobility;Reduced laryngeal elevation;Pharyngeal residue  - valleculae;Pharyngeal residue - cp segment Penetration/Aspiration details (puree): Material does not enter  airway Pharyngeal - Regular: Reduced epiglottic inversion;Reduced  anterior laryngeal mobility;Reduced laryngeal  elevation;Pharyngeal residue - valleculae;Pharyngeal residue - cp  segment Pharyngeal - Pill: Not tested  Cervical Esophageal Phase    GO    Cervical Esophageal Phase Cervical Esophageal Phase: Impaired Cervical Esophageal Phase - Comment Cervical Esophageal Comment: see impression statement        Herbie Baltimore, MA CCC-SLP [phone]  Katherene Ponto Deblois 01/26/2014, 12:48 PM    Dg Swallowing Func-speech Pathology  01/21/2014   Katherene Ponto Deblois, CCC-SLP     01/21/2014 10:58 AM Objective Swallowing Evaluation: Modified Barium Swallowing Study   Patient Details  Name: ARES CARDOZO MRN: [id number] Date of Birth: 21-Aug-1940  Today's Date: 01/21/2014 Time: 0930-1000 SLP Time Calculation (min): 30 min  Past Medical History:  Past Medical History  Diagnosis Date  . Diabetes mellitus without complication   . Hypertension   . Hypercholesteremia    Past Surgical History:  Past  Surgical History  Procedure Laterality Date  . Prostatectomy    . Colon resection     HPI:  73 year old male with HTN and DM presented 5/27 to ED c/o N/V  since 5/25. In ED found to be in acute renal failure,  hyperkalemic, hypotensive with SBP's in 70s. Started on BiPAP for  hypoxia and increased WOB. Intubated from 5/28 to 6/1. Pt with  ICU delirium. Admission Chest CXR says New confluent bibasilar  pulmonary opacity, nonspecific but consider bilateral pneumonia  or aspiration.      Assessment / Plan / Recommendation Clinical Impression  Dysphagia Diagnosis: Moderate cervical esophageal phase  dysphagia;Severe pharyngeal phase dysphagia;Mild oral phase  dysphagia Clinical impression: Pt demonstrates a primary structural  cervical esophageal dysphagia, present at baseline, that is  likely worsed by pts mental status and generalized weakness. Pt  was awake, but required max cues to keep head up, bring cup to  lips and orally accept POs. Oral phase characterized by slow  transit. Pt noted to have the appearance of bony protrusion at  C5/6 and a prominent cricopharyngeus (no radiologist present to  confirm) as well as reduced laryngeal elevation and hyoid  excursion. These deficits result in incomplete airway closure  with silent aspiration during the swallow and residuals over the  CP segment and in the valleculae that are repentrated after the  swallow even if pt cued to clear his throat. Findings consistent  with possible admission with aspiration pna. Pt would be at high  risk of aspiration at this time and would benefit from several  days of recovery prior to retesting and attempting POs. Improved  mental status and general strength would be indicators for  retest. Recommend NPO with short term alternate nutrition. SLP  will revisit pt on Monday for possiblity of retest.     Treatment Recommendation  Therapy as outlined in treatment plan below    Diet Recommendation NPO;Alternative means - temporary   Medication  Administration: Via alternative means    Other  Recommendations Oral Care Recommendations: Oral care Q4  per protocol   Follow Up Recommendations  Skilled Nursing facility    Frequency and Duration min 3x week  2 weeks   Pertinent Vitals/Pain NA    SLP Swallow Goals     General HPI: 73 year old male  with HTN and DM presented 5/27 to  ED c/o N/V since 5/25. In ED found to be in acute renal failure,  hyperkalemic, hypotensive with SBP's in 70s. Started on BiPAP for  hypoxia and increased WOB. Intubated from 5/28 to 6/1. Pt with  ICU delirium. Admission Chest CXR says New confluent bibasilar  pulmonary opacity, nonspecific but consider bilateral pneumonia  or aspiration.  Type of Study: Modified Barium Swallowing Study Reason for Referral: Objectively evaluate swallowing function Previous Swallow Assessment: none in chart Diet Prior to this Study: NPO Temperature Spikes Noted: No Respiratory Status: Nasal cannula History of Recent Intubation: Yes Length of Intubations (days): 5 days Date extubated: 01/18/14 Behavior/Cognition: Alert;Cooperative;Requires  cueing;Distractible Oral Cavity - Dentition: Dentures, top;Dentures, bottom Oral Motor / Sensory Function: Impaired - see Bedside swallow  eval Self-Feeding Abilities: Able to feed self;Needs assist Patient Positioning: Upright in chair Baseline Vocal Quality: Low vocal intensity;Wet Volitional Cough: Strong Volitional Swallow: Able to elicit Anatomy:  (bony protrustion at C5/6, Prominent CP, no radiologist  pres.) Pharyngeal Secretions:  (suspect standing secretions)    Reason for Referral Objectively evaluate swallowing function   Oral Phase Oral Preparation/Oral Phase Oral Phase: Impaired Oral - Honey Oral - Honey Teaspoon: Delayed oral transit Oral - Nectar Oral - Nectar Teaspoon: Delayed oral transit Oral - Nectar Cup: Delayed oral transit Oral - Thin Oral - Thin Teaspoon: Right anterior bolus loss;Left anterior  bolus loss;Delayed oral transit Oral - Thin Straw:  Delayed oral transit (pt struggles to tip head  and cup back for thin cup) Oral - Solids Oral - Puree: Within functional limits   Pharyngeal Phase Pharyngeal Phase Pharyngeal Phase: Impaired Pharyngeal - Honey Pharyngeal - Honey Teaspoon: Reduced anterior laryngeal  mobility;Reduced epiglottic inversion;Reduced laryngeal  elevation;Reduced airway/laryngeal closure;Reduced tongue base  retraction;Penetration/Aspiration after  swallow;Penetration/Aspiration during swallow;Trace  aspiration;Pharyngeal residue - valleculae;Pharyngeal residue -  cp segment;Pharyngeal residue - pyriform sinuses;Compensatory  strategies attempted (Comment) (head turn left - not effective) Penetration/Aspiration details (honey teaspoon): Material enters  airway, CONTACTS cords and not ejected out;Material enters  airway, passes BELOW cords without attempt by patient to eject  out (silent aspiration) Pharyngeal - Nectar Pharyngeal - Nectar Teaspoon: Reduced anterior laryngeal  mobility;Reduced epiglottic inversion;Reduced laryngeal  elevation;Reduced airway/laryngeal closure;Reduced tongue base  retraction;Penetration/Aspiration after  swallow;Penetration/Aspiration during swallow;Trace  aspiration;Pharyngeal residue - valleculae;Pharyngeal residue -  cp segment;Pharyngeal residue - pyriform sinuses;Compensatory  strategies attempted (Comment) Penetration/Aspiration details (nectar teaspoon): Material enters  airway, CONTACTS cords and not ejected out;Material enters  airway, passes BELOW cords without attempt by patient to eject  out (silent aspiration) Pharyngeal - Nectar Cup: Reduced anterior laryngeal  mobility;Reduced epiglottic inversion;Reduced laryngeal  elevation;Reduced airway/laryngeal closure;Reduced tongue base  retraction;Penetration/Aspiration after  swallow;Penetration/Aspiration during swallow;Trace  aspiration;Pharyngeal residue - valleculae;Pharyngeal residue -  cp segment;Pharyngeal residue - pyriform sinuses;Compensatory   strategies attempted (Comment) Penetration/Aspiration details (nectar cup): Material enters  airway, CONTACTS cords and not ejected out;Material enters  airway, passes BELOW cords without attempt by patient to eject  out (silent aspiration) Pharyngeal - Thin Pharyngeal - Thin Teaspoon: Not tested (all spilled from mouth) Pharyngeal - Thin Straw: Reduced anterior laryngeal  mobility;Reduced epiglottic inversion;Reduced laryngeal  elevation;Reduced airway/laryngeal closure;Reduced tongue base  retraction;Penetration/Aspiration during swallow;Pharyngeal  residue - valleculae;Pharyngeal residue - cp segment;Pharyngeal  residue - pyriform sinuses;Compensatory strategies attempted  (Comment);Moderate aspiration Penetration/Aspiration details (thin straw): Material enters  airway, passes BELOW cords without attempt by patient to eject  out (silent aspiration) Pharyngeal - Solids Pharyngeal - Puree: Reduced anterior laryngeal mobility;Reduced  epiglottic inversion;Reduced laryngeal elevation;Reduced  airway/laryngeal closure;Reduced tongue base  retraction;Pharyngeal residue - valleculae;Pharyngeal residue -  cp segment;Pharyngeal residue - pyriform sinuses;Compensatory  strategies attempted (Comment) Penetration/Aspiration details (puree): Material does not enter  airway  Cervical Esophageal Phase    GO    Cervical Esophageal Phase Cervical Esophageal Phase: Impaired Cervical Esophageal Phase - Comment Cervical Esophageal Comment: see impression statement        Herbie [place], MA CCC-SLP [phone]  Katherene Ponto Deblois 01/21/2014, 10:56 AM     Medications: Scheduled Meds: . antiseptic oral rinse  15 mL Mouth Rinse BID  . aspirin  81 mg Oral Daily  . carvedilol  6.25 mg Oral BID WC  . darbepoetin (ARANESP) injection - NON-DIALYSIS  100 mcg Subcutaneous Q Mon-1800  . insulin aspart  0-24 Units Subcutaneous TID WC  . pantoprazole  40 mg Oral QAC breakfast      LOS: 26 days   RAI,RIPUDEEP M.D. [facility] 02/08/2014, 11:43 AM Pager: [phone]  If 7PM-7AM, please contact night-coverage [email or web address] Password [email or web address]  **Disclaimer: This note was dictated with voice recognition software. Similar sounding words can inadvertently be transcribed and this note may contain transcription errors which may not have been corrected upon publication of note.**

## 2014-02-08 NOTE — Progress Notes (Signed)
Background: 73 year old male with  hypertension, diabetes who presented to the ED on 5/27 with nausea and vomiting x2 days. Found to be in severe acute renal failure with a creatinine of 9.36, potassium greater than 7.7, hypotensive, hypoxic, likely from aspiration pneumonia from vomiting with volume overload and started on BiPAP. Admitted to the critical care service, started on pressor support, CVVHD and intubated. CRRT stopped 6/2 and transitioned to IHD.  He is currently making urine. Right TDC replaced  6/14 by interventional radiology. Once it is determined whether patient will need dialysis long-term, he will go to skilled nursing facility.   Assessment /Plans 1. AKI due to ATN related to NSAID, ACEi, hypovolemia, PNA, Septic shock, CRRT 5/28-6/2, now getting prn HD. Found out from Dr. Arelia Sneddon' office Friday that  creatinine was 1.05 in May of this year so that makes me much more optimistic that he will recover. Creatinine had been  rising in interdialytic interval but is stable from yesterday to today.  Last HD was 6/18. Vol status good. There are no indications for HD today dialyze prn pending recovery- if creatine starts to trend down we may be able to remove the [facility] and get him to SNF 2. Anemia - s/p PRBC's with HD 6/14; tsat low but ferritin of 2000 precludes use of Fe with HD. Added darbe 100/week [place]  (6/15) 3. HTN - meds. Increased carvedilol to 6.25 BID- seems better today 4. DM - SSI 5. S/p PNA (aspiration- completed ATB's) 6. Diastolic CHF - stable and not in CHF now. 7. Atrial flutter - occurred on dialysis 6/16; has not had this issue prior. Responded to IV lopressor.  8. S/p NTEMI - demand ischemia in setting of septic shock on adm  Subjective: Feels better today Denies pain or SOB Looks less depressed  Objective: Vital signs in last 24 hours: Temp:  [98.4 F (36.9 C)-98.7 F (37.1 C)] 98.4 F (36.9 C) (06/22 1005) Pulse Rate:  [76-88] 76 (06/22 1005) Resp:  [17-18] 17  (06/22 1005) BP: (127-172)/(67-98) 127/78 mmHg (06/22 1005) SpO2:  [95 %-99 %] 98 % (06/22 1005) Weight:  [76.9 kg (169 lb 8.5 oz)] 76.9 kg (169 lb 8.5 oz) (06/22 0311) Weight change: 0.242 kg (8.5 oz)  Intake/Output from previous day: 06/21 0701 - 06/22 0700 In: 480 [P.O.:480] Out: 800 [Urine:800] Intake/Output this shift:   BP 127/78  Pulse 76  Temp(Src) 98.4 F (36.9 C) (Oral)  Resp 17  Ht 5\' 11"  (1.803 m)  Wt 76.9 kg (169 lb 8.5 oz)  BMI 23.66 kg/m2  SpO2 98%  Physical examination VS as noted NAD Regular S1S2 No S3 Lungs grossly clear TDC dressing intact right chest Abd soft and nontender No edema whatsoever of the LE's   Recent Labs  02/06/14 1031 02/08/14 0350  WBC 11.2* 11.5*  HGB 8.8* 8.8*  HCT 28.0* 27.7*  PLT 207 222    Recent Labs  02/07/14 0505 02/08/14 0350  NA 139 139  K 3.4* 3.8  CL 97 98  CO2 24 24  GLUCOSE 109* 108*  BUN 48* 53*  CREATININE 5.22* 5.20*  CALCIUM 8.2* 8.3*  Phos 5.5  Results for MARVON, [place] (MRN [id number]) as of 02/02/2014 12:43  Ref. Range 02/02/2014 05:07  Iron Latest Range: 42-135 ug/dL 29 (L)  UIBC Latest Range: 125-400 ug/dL 164  TIBC Latest Range: 215-435 ug/dL 193 (L)  Saturation Ratios Latest Range: 20-55 % 15 (L)  Ferritin Latest Range: 22-322 ng/mL 2065 (H)   Scheduled Medications: .  antiseptic oral rinse  15 mL Mouth Rinse BID  . aspirin  81 mg Oral Daily  . carvedilol  6.25 mg Oral BID WC  . darbepoetin (ARANESP) injection - NON-DIALYSIS  100 mcg Subcutaneous Q Mon-1800  . insulin aspart  0-24 Units Subcutaneous TID WC  . pantoprazole  40 mg Oral QAC breakfast    LOS: 26 days   GOLDSBOROUGH,KELLIE A 02/08/2014,12:17 PM

## 2014-02-09 LAB — BASIC METABOLIC PANEL
BUN: 55 mg/dL — AB (ref 6–23)
CHLORIDE: 101 meq/L (ref 96–112)
CO2: 23 meq/L (ref 19–32)
CREATININE: 4.95 mg/dL — AB (ref 0.50–1.35)
Calcium: 8.2 mg/dL — ABNORMAL LOW (ref 8.4–10.5)
GFR calc Af Amer: 12 mL/min — ABNORMAL LOW (ref >90)
GFR calc non Af Amer: 11 mL/min — ABNORMAL LOW (ref >90)
GLUCOSE: 102 mg/dL — AB (ref 70–99)
Potassium: 3.9 mEq/L (ref 3.7–5.3)
Sodium: 141 mEq/L (ref 137–147)

## 2014-02-09 LAB — CBC
HEMATOCRIT: 28 % — AB (ref 39.0–52.0)
HEMOGLOBIN: 8.6 g/dL — AB (ref 13.0–17.0)
MCH: 27.8 pg (ref 26.0–34.0)
MCHC: 30.7 g/dL (ref 30.0–36.0)
MCV: 90.6 fL (ref 78.0–100.0)
Platelets: 247 10*3/uL (ref 150–400)
RBC: 3.09 MIL/uL — ABNORMAL LOW (ref 4.22–5.81)
RDW: 15.3 % (ref 11.5–15.5)
WBC: 10.4 10*3/uL (ref 4.0–10.5)

## 2014-02-09 LAB — GLUCOSE, CAPILLARY
GLUCOSE-CAPILLARY: 118 mg/dL — AB (ref 70–99)
Glucose-Capillary: 144 mg/dL — ABNORMAL HIGH (ref 70–99)
Glucose-Capillary: 100 mg/dL — ABNORMAL HIGH (ref 70–99)
Glucose-Capillary: 113 mg/dL — ABNORMAL HIGH (ref 70–99)
Glucose-Capillary: 123 mg/dL — ABNORMAL HIGH (ref 70–99)

## 2014-02-09 NOTE — Progress Notes (Signed)
Patient ID: [name]  male  KZL:[id number]    DOB: 11/04/1940    DOA: 01/13/2014  PCP: No primary provider on file.  Interim summary  Patient is a 73 year old male with past medical history hypertension diabetes who presented to the emergency room on 5/27 with complaints of nausea and vomiting x2 days. The patient was found to be in severe acute renal failure with a creatinine of 9.36, potassium greater than 7.7 and was hypotensive. Patient noted to be hypoxic, likely from aspiration pneumonia from vomiting with volume overload and was started on BiPAP. He was admitted to the critical care service. Nephrology was consulted. Patient was started on pressor support, CVVH and was intubated.   ARF was felt to be multifactorial from hypovolemia, NSAID-induced nephropathy plus ACE inhibitor. Also high anion gap metabolic acidosis noted with metformin as contributing factor. Renal ultrasound notes medical renal disease. Chest x-ray noted pneumonia and/or pulmonary edema. Patient was treated as septic shock with pressor support. Mildly elevated troponin plus questionable EKG changes, felt to have mild non-STEMI secondary to demand ischemia. He was started on on tube feedings.   Pt significantly improved and was extubated 6/1. Pressors and stress dose steroids were weaned off. Antibiotics were  completed on 6/3. Patient was continued on tube feedings as there was concerns about chronic aspiration due to to decreased mentation even after extubation. Patient failed initial swallow eval 6/3. CERT was stopped on 6/2 and hemodialysis was initiated as the renal function did not improve. Patient was transferred to the floor to hospitalist service on 6/5.  Patient's mentation has continued to improve. Speech therapy continue to follow and after modified barium swallow on 6/9, patient upgraded to dysphagia 3 diet with nectar thick liquids. Patient intermittently dialyzed with hopes renal function will improve. The  patient's last dialysis was 6/18. He is currently making urine. However, dialysis catheter accidentally removed by patient on 6/11 early morning. Catheter replaced on 6/14 by interventional radiology.  Patient was followed by speech therapy on 6/16 who upgraded him to a dysphagia 3 diet with thin liquids   On 6/16, Patient started developing episodes of atrial flutter during dialysis, was treated with IV Lopressor.  Per nephrology, no indications for hemodialysis today, monitor creatinine, if continues to trend down, may be able to remove the [facility] and get him to SNF.      Assessment/Plan: Principal Problem:  Hypotensive/Septic shock, aspiration pneumonia: Resolved. Patient met criteria given hypotension, end organ damage and required pressor support. Source felt to be pneumonia secondary to aspiration.  - Completed antibiotic course, continue dysphagia 3 diet.   Active problems Acute renal failure now in the setting of chronic renal disease. Improving, Nephrology following. Baseline Cr 1.05 in May 2015 - Creatinine 4.9, last HD 6/18, Catheter replaced 6/14 - Renal recommendations reviewed, no indications for hemodialysis today, monitor creatinine, if continues to trend down,may be able to remove the [facility] and get him to SNF  Metabolic acidosis: Secondary to uremia. Initially from septic shock, resolved.   Hyperkalemia: Secondary renal failure. Initially treated with dialysis.  - Resolved  Thrombocytopenia: Resolved. Secondary to thrombocytopenia critical illness.    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   Atrial flutter: On 6/16, Patient started developing episodes of significant tachycardia during dialysis. Rhythm assessment found to be aflutter.  - Currently stable, on Coreg  Diabetes mellitus:  - CBG stable. On sliding scale only. A1c 5.5.  - At this point, discontinue metformin permanently given renal failure  and try to manage with dietary control only   Dysphagia:  In part due to mentation. -  Upgraded by speech therapy to dysphagia 3, thin liquids. Patient has history of presbyesophagus. Speech therapy should follow even at skilled nursing facility   HTN: Stable  Hyperlipidemia   Diastolic heart failure: Echocardiogram done in the setting of ?Non-STEMI. Grade 2 diastolic dysfunction noted.  NSTEMI: Demand ischemia in the setting of septic shock. Resolved.   Toe ulceration: Patient was scheduled by orthopedic surgery to have planned left fourth toe amputation done 6/25. Hospitalist left message for need for postponement   DVT Prophylaxis:  Code Status:  Family Communication: Discussed with patient at bedside, discussed with wife in detail yesterday  Disposition: Awaiting final renal recommendations, Will need skilled nursing facility for rehabilitation  Consultants:   nephrology Critical Care  Interventional radiology  Speech therapy   Procedures:  Status post intubation on 5/28-extubated 6/1  Temporary dialysis catheter placed 5/28, patient removed it on 6/11  CVVH started 5/20  Echo 5/28 >> EF 65 to 37%, grade 2 diastolic dysfx, mod AS  Placement of dialysis catheter by interventional radiology on 6/14      Antibiotics:  Zosyn 5/27 >>> 6/03  Vancomycin 5/27 >>> 6/02  Subjective: Patient seen and examined, tolerating diet, feels a whole lot better, awaiting final decision from nephrology, hopeful that he would not need hemodialysis  Objective: Weight change: 2.026 kg (4 lb 7.5 oz)  Intake/Output Summary (Last 24 hours) at 02/09/14 1314 Last data filed at 02/09/14 0810  Gross per 24 hour  Intake    360 ml  Output   1675 ml  Net  -1315 ml   Blood pressure 135/80, pulse 86, temperature 98.2 F (36.8 C), temperature source Oral, resp. rate 18, height 5' 11"  (1.803 m), weight 78.926 kg (174 lb), SpO2 96.00%.  Physical Exam: General: Alert and awake, NAD oriented pleasant CVS: S1-S2 clear, 3/6 holosystolic murmur  Chest:  CTAB Abdomen: soft nontender, nondistended, normal bowel sounds  Extremities: no cyanosis, clubbing or edema noted bilaterally   Lab Results: Basic Metabolic Panel:  Recent Labs Lab 02/08/14 0350 02/09/14 0455  NA 139 141  K 3.8 3.9  CL 98 101  CO2 24 23  GLUCOSE 108* 102*  BUN 53* 55*  CREATININE 5.20* 4.95*  CALCIUM 8.3* 8.2*  PHOS 5.1*  --    Liver Function Tests:  Recent Labs Lab 02/07/14 0505 02/08/14 0350  ALBUMIN 2.3* 2.2*   No results found for this basename: LIPASE, AMYLASE,  in the last 168 hours No results found for this basename: AMMONIA,  in the last 168 hours CBC:  Recent Labs Lab 02/08/14 0350 02/09/14 0455  WBC 11.5* 10.4  HGB 8.8* 8.6*  HCT 27.7* 28.0*  MCV 89.9 90.6  PLT 222 247   Cardiac Enzymes: No results found for this basename: CKTOTAL, CKMB, CKMBINDEX, TROPONINI,  in the last 168 hours BNP: No components found with this basename: POCBNP,  CBG:  Recent Labs Lab 02/08/14 1318 02/08/14 1704 02/08/14 2059 02/09/14 0726 02/09/14 1222  GLUCAP 181* 94 144* 100* 113*     Micro Results: No results found for this or any previous visit (from the past 240 hour(s)).  Studies/Results: Dg Abd 1 View  01/23/2014   CLINICAL DATA:  Feeding tube placement.  EXAM: ABDOMEN - 1 VIEW  COMPARISON:  01/21/2014  FINDINGS: Feeding tube tip is seen with tip in the fundus of the stomach. Some residual contrast is seen within the  colon. No dilated bowel loops seen.  IMPRESSION: Feeding tube tip in fundus of stomach.   Electronically Signed   By: Earle Gell M.D.   On: 01/23/2014 18:57   Dg Esophagus  01/26/2014   CLINICAL DATA:  Dysphagia. Modified swallow earlier in the morning only clearing patient for nectar thickness.  EXAM: ESOPHOGRAM/BARIUM SWALLOW  TECHNIQUE: Single contrast examination was performed using  thick barium.  FLUOROSCOPY TIME:  3 min and 20 is seconds  COMPARISON:  Chest radiograph 01/19/2014.  FINDINGS: Focused single-contrast exam was  performed in a supine, mildly LPO position. The bed was maintained partially upright.  Evaluation of a single swallow demonstrates contrast stasis throughout the mid and upper thoracic esophagus.  Full column evaluation of the esophagus demonstrates no persistent narrowing or stricture.  13 mm barium tablet has initial delay in the vallecula. On subsequent swallows, the tablet passes into the esophagus and then into the stomach.  IMPRESSION: 1. Moderately degraded exam. 2. 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 3. No evidence of  stricture other anatomic cause for dysphagia. 4. Delayed passage of the barium tablet at the level of the vallecula.   Electronically Signed   By: Abigail Miyamoto M.D.   On: 01/26/2014 16:14   US Renal  01/14/2014   CLINICAL DATA:  Renal failure, history hypertension, diabetes  EXAM: RENAL/URINARY TRACT ULTRASOUND COMPLETE  COMPARISON:  None  FINDINGS: Right Kidney:  Length: 12.4 cm length. Cortical thickness up to 1.8 cm thick. Increased cortical echogenicity. Hypoechoic nodule medial aspect mid RIGHT kidney 2.1 x 2.2 x 2.1 cm containing scattered internal echoes question complicated cyst. No additional mass, hydronephrosis or shadowing calcification.  Left Kidney:  Length: 10.5 cm length. Cortical thickness up to 2.0 cm thick. Increased cortical echogenicity. Minimally complicated cyst at mid LEFT kidney 2.7 x 3.3 x 3.7 cm containing slightly irregular wall and scattered internal echoes. No additional mass, hydronephrosis or shadowing calcification.  Bladder:  Contains only minimal urine. Minimally enlarged central lobe of prostate.  IMPRESSION: Increased renal cortical echogenicity bilaterally consistent with medical renal disease.  Mildly complicated cysts within the mid portions of both kidneys.  No evidence of hydronephrosis.   Electronically Signed   By: Lavonia Dana M.D.   On: 01/14/2014 02:31   Ir Fluoro Guide Cv Line Right  01/31/2014   INDICATION: Renal failure  and needs access for hemodialysis.  EXAM: FLUOROSCOPIC AND ULTRASOUND GUIDED PLACEMENT OF A TUNNELED DIALYSIS CATHETER  Physician: Stephan Minister. Anselm Pancoast, MD  FLUOROSCOPY TIME:  24 seconds  MEDICATIONS: 0.5 mg versed, 25 mcg fentanyl. A radiology nurse monitored the patient for moderate sedation.  ANESTHESIA/SEDATION: Moderate sedation time: 19 minutes  PROCEDURE: Informed consent was obtained for placement of a tunneled dialysis catheter. The patient was placed supine on the interventional table. Ultrasound confirmed a patent right internal jugularvein. Ultrasound images were obtained for documentation. The right side of the neck was prepped and draped in a sterile fashion. The right side of the neck was anesthetized with 1% lidocaine. Maximal barrier sterile technique was utilized including caps, mask, sterile gowns, sterile gloves, sterile drape, hand hygiene and skin antiseptic. A small incision was made with #11 blade scalpel. A 21 gauge needle directed into the right internal jugular vein with ultrasound guidance. A micropuncture dilator set was placed. A 19 cm tip to cuff HemoSplit catheter was selected. The skin below the right clavicle was anesthetized and a small incision was made with an #11 blade scalpel. A subcutaneous tunnel was formed  to the vein dermatotomy site. The catheter was brought through the tunnel. The vein dermatotomy site was dilated to accommodate a peel-away sheath. The catheter was placed through the peel-away sheath and directed into the central venous structures. The tip of the catheter was placed in the lower SVC with fluoroscopy. Fluoroscopic images were obtained for documentation. Both lumens were found to aspirate and flush well. The proper amount of heparin was flushed in both lumens. The vein dermatotomy site was closed using a single layer of absorbable suture and Dermabond. The catheter was secured to the skin using Prolene suture.  FINDINGS: Catheter tip in the lower SVC.   COMPLICATIONS: None  IMPRESSION: Successful placement of a right jugular tunneled dialysis catheter using ultrasound and fluoroscopic guidance.   Electronically Signed   By: Markus Daft M.D.   On: 01/31/2014 13:45   Ir US Guide Vasc Access Right  01/31/2014   INDICATION: Renal failure and needs access for hemodialysis.  EXAM: FLUOROSCOPIC AND ULTRASOUND GUIDED PLACEMENT OF A TUNNELED DIALYSIS CATHETER  Physician: Stephan Minister. Anselm Pancoast, MD  FLUOROSCOPY TIME:  24 seconds  MEDICATIONS: 0.5 mg versed, 25 mcg fentanyl. A radiology nurse monitored the patient for moderate sedation.  ANESTHESIA/SEDATION: Moderate sedation time: 19 minutes  PROCEDURE: Informed consent was obtained for placement of a tunneled dialysis catheter. The patient was placed supine on the interventional table. Ultrasound confirmed a patent right internal jugularvein. Ultrasound images were obtained for documentation. The right side of the neck was prepped and draped in a sterile fashion. The right side of the neck was anesthetized with 1% lidocaine. Maximal barrier sterile technique was utilized including caps, mask, sterile gowns, sterile gloves, sterile drape, hand hygiene and skin antiseptic. A small incision was made with #11 blade scalpel. A 21 gauge needle directed into the right internal jugular vein with ultrasound guidance. A micropuncture dilator set was placed. A 19 cm tip to cuff HemoSplit catheter was selected. The skin below the right clavicle was anesthetized and a small incision was made with an #11 blade scalpel. A subcutaneous tunnel was formed to the vein dermatotomy site. The catheter was brought through the tunnel. The vein dermatotomy site was dilated to accommodate a peel-away sheath. The catheter was placed through the peel-away sheath and directed into the central venous structures. The tip of the catheter was placed in the lower SVC with fluoroscopy. Fluoroscopic images were obtained for documentation. Both lumens were found to  aspirate and flush well. The proper amount of heparin was flushed in both lumens. The vein dermatotomy site was closed using a single layer of absorbable suture and Dermabond. The catheter was secured to the skin using Prolene suture.  FINDINGS: Catheter tip in the lower SVC.  COMPLICATIONS: None  IMPRESSION: Successful placement of a right jugular tunneled dialysis catheter using ultrasound and fluoroscopic guidance.   Electronically Signed   By: Markus Daft M.D.   On: 01/31/2014 13:45   Dg Chest Port 1 View  01/19/2014   CLINICAL DATA:  Cough  EXAM: PORTABLE CHEST - 1 VIEW  COMPARISON:  01/18/2014  FINDINGS: Cardiac shadow is stable. A right jugular line and left jugular temporary dialysis catheter are again seen and stable. The endotracheal tube and nasogastric catheter have been removed. The lungs are well aerated bilaterally. Improved aeration is again seen bilaterally with persistent changes in the right lung base. No new focal infiltrate is seen.  IMPRESSION: Improved aeration with some persistent changes in the right base.  Tubes and lines as  described.   Electronically Signed   By: Inez Catalina M.D.   On: 01/19/2014 06:53   Dg Chest Port 1 View  01/18/2014   CLINICAL DATA:  Check endotracheal tube position.  EXAM: PORTABLE CHEST - 1 VIEW  COMPARISON:  01/16/2014.  FINDINGS: Endotracheal tube terminates approximately 3.1 cm above the carina. Nasogastric tube is followed into the stomach. Right IJ central line tip projects over the low SVC. Left IJ catheter tip projects over the brachiocephalic vein confluence or upper SVC. Heart is enlarged, stable. Thoracic aorta is calcified. There is mild-to-moderate diffuse bilateral airspace disease with slight improvement in aeration at the right lung base. No definite pleural fluid.  IMPRESSION: Mild to moderate diffuse bilateral airspace disease, likely due to pulmonary edema. Slight improvement in aeration at the right lung base.   Electronically Signed   By:  Lorin Picket M.D.   On: 01/18/2014 07:40   Dg Chest Port 1 View  01/16/2014   CLINICAL DATA:  Aspiration pneumonia  EXAM: PORTABLE CHEST - 1 VIEW  COMPARISON:  01/15/2014  FINDINGS: Endotracheal tube ends in the mid thoracic trachea. Unchanged positioning of bilateral IJ catheters, tips at the level of the SVC. Enteric tube enters the stomach.  Bibasilar airspace disease shows no progression or clearing. No evidence of increasing pleural fluid or air leak.  IMPRESSION: 1. Stable positioning of tubes and lines. 2. Unchanged bibasilar pneumonia.   Electronically Signed   By: Jorje Guild M.D.   On: 01/16/2014 07:18   Dg Chest Port 1 View  01/15/2014   CLINICAL DATA:  Ventilator support.  Pneumonia.  EXAM: PORTABLE CHEST - 1 VIEW  COMPARISON:  01/14/2014  FINDINGS: Endotracheal tube has its tip 4 cm above the carina. Nasogastric tube enters the stomach. Right internal jugular central line has its tip in the SVC above the right atrium. Left internal jugular central line tips are in the SVC above the right atrium. Bilateral lower lobe airspace filling consistent with pneumonia persists, probably slightly improved since yesterday. The upper lungs remain clear. No effusions.  IMPRESSION: Lines and tubes well positioned. Slight improvement in bilateral lower lobe pneumonia.   Electronically Signed   By: Nelson Chimes M.D.   On: 01/15/2014 07:35   Dg Chest Port 1 View  01/14/2014   CLINICAL DATA:  Status post central line placement  EXAM: PORTABLE CHEST - 1 VIEW  COMPARISON:  01/14/2014 225 hr  FINDINGS: A left jugular dual-lumen central line is again identified and stable. A new right jugular central line is seen with the catheter tip at the cavoatrial junction. No pneumothorax is noted. The endotracheal tube is seen 5 cm above the carina. A nasogastric catheter is noted extending into the stomach. Bilateral lower lung infiltrates are seen. The overall appearance is stable. No new focal abnormality is noted.   IMPRESSION: Stable bilateral infiltrates.  New tubes and lines as described above without complicating factors.   Electronically Signed   By: Inez Catalina M.D.   On: 01/14/2014 09:33   Dg Chest Port 1 View  01/14/2014   CLINICAL DATA:  Hemodialysis catheter placement  EXAM: Portable exam 0225 hr compared to 01/13/2014  COMPARISON:  None.  FINDINGS: LEFT jugular dual-lumen central venous catheter with tip projecting over mid SVC.  Enlargement of cardiac silhouette with pulmonary vascular congestion.  Perihilar and basilar infiltrates which could represent edema or infection.  No pneumothorax.  Bones unremarkable.  IMPRESSION: No pneumothorax following LEFT jugular line placement.  Enlargement of  cardiac silhouette with pulmonary vascular congestion and diffuse increased pulmonary infiltrates, question edema versus infection.   Electronically Signed   By: Lavonia Dana M.D.   On: 01/14/2014 02:41   Dg Chest Port 1 View  01/13/2014   CLINICAL DATA:  73 year old male with back and knee pain. Shortness of Breath. Initial encounter.  EXAM: PORTABLE CHEST - 1 VIEW  COMPARISON:  11/21/2007.  FINDINGS: Portable AP supine view at 2211 hrs. New confluent bibasilar pulmonary opacity. No pneumothorax. Pulmonary vascularity within normal limits. Stable cardiac size and mediastinal contours. Visualized tracheal air column is within normal limits.  IMPRESSION: New confluent bibasilar pulmonary opacity, nonspecific but consider bilateral pneumonia or aspiration.   Electronically Signed   By: Lars Pinks M.D.   On: 01/13/2014 23:02   Dg Abd Portable 1v  01/23/2014   CLINICAL DATA:  Feeding tube placement.  EXAM: PORTABLE ABDOMEN - 1 VIEW  COMPARISON:  01/23/2014  FINDINGS: The Dobbhoff feeding tube is looped back on itself in the descending duodenum. The tip is in the antropyloric region of the stomach.  IMPRESSION: Dobbhoff feeding tube is looped back on itself in the descending duodenum.   Electronically Signed   By: Kalman Jewels M.D.   On: 01/23/2014 23:44   Dg Abd Portable 1v  01/21/2014   CLINICAL DATA:  Feeding tube placement.  EXAM: PORTABLE ABDOMEN - 1 VIEW  COMPARISON:  None.  FINDINGS: Weighted tip feeding tube is present in the proximal gastric fundus. Barium is present in small bowel. The bowel gas pattern is nonobstructive. Gaseous distention of the colon.  IMPRESSION: Weighted tip feeding tube in the proximal gastric fundus.   Electronically Signed   By: Dereck Ligas M.D.   On: 01/21/2014 16:32   Dg Swallowing Func-speech Pathology  02/02/2014   Katherene Ponto Deblois, CCC-SLP     02/02/2014 10:53 AM Objective Swallowing Evaluation: Modified Barium Swallowing Study   Patient Details  Name: JARID SASSO MRN: [id number] Date of Birth: Mar 17, 1941  Today's Date: 02/02/2014 Time: 1000-1030 SLP Time Calculation (min): 30 min  Past Medical History:  Past Medical History  Diagnosis Date  . Diabetes mellitus without complication   . Hypertension   . Hypercholesteremia    Past Surgical History:  Past Surgical History  Procedure Laterality Date  . Prostatectomy    . Colon resection     HPI:  73 year old male with HTN and DM presented 5/27 to ED c/o N/V  since 5/25. In ED found to be in acute renal failure,  hyperkalemic, hypotensive with SBP's in 70s. Started on BiPAP for  hypoxia and increased WOB. Intubated from 5/28 to 6/1. Pt with  ICU delirium. Admission Chest CXR says New confluent bibasilar  pulmonary opacity, nonspecific but consider bilateral pneumonia  or aspiration. Pt has undergone multiple MBS studies with  yesterday's study showing improved swallow function to allow po  intake with strict precautions.       Assessment / Plan / Recommendation Clinical Impression  Dysphagia Diagnosis: Moderate pharyngeal phase  dysphagia;Moderate cervical esophageal phase dysphagia Clinical impression: Pt demonstrates swallow function that is  likely his baseline. Strength has continued to improve with  minimal oropharygneal  residuals even with solid textures. 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 Pt  consistently has trace silent penetration to the cords with small  to normal sips of thin liquids. He had aspiration when trying to  swallow too quickly  or tilting his head dramatically back. If pt  clears his throat every 2-3 sips he can expel trace penetrates.  Again this function appears to be pts baseline. Recommend a dys 3  (mechanical soft) diet with thin liquids and an intermittent  throat clear with ongoing therapy to address compensatory  strategies.     Treatment Recommendation  Therapy as outlined in treatment plan below    Diet Recommendation Dysphagia 3 (Mechanical Soft);Thin liquid   Liquid Administration via: Cup;No straw Medication Administration: Whole meds with puree Supervision: Patient able to self feed;Full supervision/cueing  for compensatory strategies Compensations: Slow rate;Small sips/bites;Clear throat  intermittently;Follow solids with liquid Postural Changes and/or Swallow Maneuvers: Seated upright 90  degrees;Upright 30-60 min after meal;Out of bed for meals    Other  Recommendations Oral Care Recommendations: Oral care BID   Follow Up Recommendations  Inpatient Rehab    Frequency and Duration min 2x/week  2 weeks   Pertinent Vitals/Pain NA    SLP Swallow Goals     General HPI: 73 year old male with HTN and DM presented 5/27 to  ED c/o N/V since 5/25. In ED found to be in acute renal failure,  hyperkalemic, hypotensive with SBP's in 70s. Started on BiPAP for  hypoxia and increased WOB. Intubated from 5/28 to 6/1. Pt with  ICU delirium. Admission Chest CXR says New confluent bibasilar  pulmonary opacity, nonspecific but consider bilateral pneumonia  or aspiration. Pt has undergone multiple MBS studies with  yesterday's study showing improved swallow function to allow po  intake with strict precautions.   Type of Study:  Modified Barium Swallowing Study Reason for Referral: Objectively evaluate swallowing function Diet Prior to this Study: Dysphagia 3 (soft);Nectar-thick liquids Temperature Spikes Noted: No Respiratory Status: Room air History of Recent Intubation: Yes Length of Intubations (days): 5 days Date extubated: 01/18/14 Behavior/Cognition: Alert;Cooperative;Pleasant mood Oral Cavity - Dentition: Dentures, top;Dentures, bottom Oral Motor / Sensory Function: Within functional limits Self-Feeding Abilities: Able to feed self Patient Positioning: Upright in chair Baseline Vocal Quality: Clear Volitional Cough: Strong Volitional Swallow: Able to elicit Anatomy: Other (Comment) (bony protrustion at C5/6, Prominent CP,  no radiologist pres) Pharyngeal Secretions: Not observed secondary MBS    Reason for Referral Objectively evaluate swallowing function   Oral Phase Oral Preparation/Oral Phase Oral Phase: WFL Oral - Honey Oral - Honey Teaspoon: Not tested Oral - Nectar Oral - Nectar Teaspoon: Not tested Oral - Nectar Cup: Not tested Oral - Thin Oral - Thin Teaspoon: Not tested Oral - Thin Straw: Within functional limits (with a chin tuck) Oral - Solids Oral - Puree: Within functional limits   Pharyngeal Phase Pharyngeal Phase Pharyngeal Phase: Impaired Pharyngeal - Honey Pharyngeal - Honey Teaspoon: Not tested Pharyngeal - Nectar Pharyngeal - Nectar Teaspoon: Not tested Pharyngeal - Nectar Cup: Not tested Pharyngeal - Nectar Straw: Not tested Pharyngeal - Thin Pharyngeal - Thin Teaspoon: Not tested Pharyngeal - Thin Cup: Delayed swallow initiation;Reduced  laryngeal elevation;Reduced airway/laryngeal  closure;Penetration/Aspiration before swallow;Trace aspiration Penetration/Aspiration details (thin cup): Material enters  airway, CONTACTS cords and not ejected out;Material enters  airway, passes BELOW cords without attempt by patient to eject  out (silent aspiration) Pharyngeal - Thin Straw: Delayed swallow initiation;Reduced   laryngeal elevation;Reduced airway/laryngeal  closure;Penetration/Aspiration before swallow;Trace aspiration Penetration/Aspiration details (thin straw): Material enters  airway, CONTACTS cords and not ejected out Pharyngeal - Solids Pharyngeal - Puree: Pharyngeal residue - valleculae;Reduced  laryngeal elevation;Reduced epiglottic inversion Penetration/Aspiration details (puree): Material does not enter  airway  Pharyngeal - Regular: Pharyngeal residue - valleculae;Reduced  laryngeal elevation;Reduced epiglottic inversion  Cervical Esophageal Phase    GO    Cervical Esophageal Phase Cervical Esophageal Phase: Impaired Cervical Esophageal Phase - Comment Cervical Esophageal Comment: see impression statement        Herbie Baltimore, MA CCC-SLP [phone]  Lynann Beaver 02/02/2014, 10:51 AM    Dg Swallowing Func-speech Pathology  01/26/2014   Katherene Ponto Deblois, CCC-SLP     01/26/2014 12:49 PM Objective Swallowing Evaluation: Modified Barium Swallowing Study   Patient Details  Name: DUANNE DUCHESNE MRN: [id number] Date of Birth: [birth year]  Today's Date: 01/26/2014 Time: 0947-0962 SLP Time Calculation (min): 19 min  Past Medical History:  Past Medical History  Diagnosis Date  . Diabetes mellitus without complication   . Hypertension   . Hypercholesteremia    Past Surgical History:  Past Surgical History  Procedure Laterality Date  . Prostatectomy    . Colon resection     HPI:  73 year old male with HTN and DM presented 5/27 to ED c/o N/V  since 5/25. In ED found to be in acute renal failure,  hyperkalemic, hypotensive with SBP's in 70s. Started on BiPAP for  hypoxia and increased WOB. Intubated from 5/28 to 6/1. Pt with  ICU delirium. Admission Chest CXR says New confluent bibasilar  pulmonary opacity, nonspecific but consider bilateral pneumonia  or aspiration.      Assessment / Plan / Recommendation Clinical Impression  Dysphagia Diagnosis: Mild cervical esophageal phase  dysphagia;Suspected primary esophageal  dysphagia;Mild pharyngeal  phase dysphagia Clinical impression: Overall strength and timeliness of swallow  has significantly improved though mild to moderate dyspahgia  persists. Reduced hyoid excursion and base of tongue retraction  leads to mild to moderate vallecular residuals that, with thin  liquids, are silently penetrated/aspirated after the swallow.  Quantity and severity of penetration with nectar thick liquids is  trace and is easily cleared with occasional throat clear. The  primary problem is likely more related to cervical esophageal and  possibly esopahgeal function. There is a prominent CP, reduced  opening of UES and pt was noted to have significant esophageal  stasis building up to proximal esophagus with one instance of  backflow to pharynx. Pt is recommended to consume a dys 3  (mechanical soft) diet with nectar thick liquids, intermittent  throat clear and esophageal precautions. Discussed concerns with  MD. Kerby Moors diet will reduce risk, but still pt may aspirate  esophageal backflow.     Treatment Recommendation  Therapy as outlined in treatment plan below    Diet Recommendation Dysphagia 3 (Mechanical Soft);Nectar-thick  liquid   Liquid Administration via: Cup;Straw Medication Administration: Crushed with puree Supervision: Patient able to self feed;Full supervision/cueing  for compensatory strategies Compensations: Slow rate;Small sips/bites;Clear throat  intermittently;Follow solids with liquid Postural Changes and/or Swallow Maneuvers: Seated upright 90  degrees;Upright 30-60 min after meal;Out of bed for meals    Other  Recommendations Oral Care Recommendations: Oral care BID Other Recommendations: Order thickener from pharmacy   Follow Up Recommendations  Inpatient Rehab    Frequency and Duration min 2x/week  2 weeks   Pertinent Vitals/Pain NA    SLP Swallow Goals     General HPI: 73 year old male with HTN and DM presented 5/27 to  ED c/o N/V since 5/25. In ED found to be in acute renal  failure,  hyperkalemic, hypotensive with SBP's in 70s. Started on BiPAP for  hypoxia and increased WOB. Intubated from  5/28 to 6/1. Pt with  ICU delirium. Admission Chest CXR says New confluent bibasilar  pulmonary opacity, nonspecific but consider bilateral pneumonia  or aspiration.  Type of Study: Modified Barium Swallowing Study Reason for Referral: Objectively evaluate swallowing function Previous Swallow Assessment: MBS 01/21/14 - NPO Diet Prior to this Study: NPO;Panda Temperature Spikes Noted: No Respiratory Status: Room air History of Recent Intubation: Yes Length of Intubations (days): 5 days Date extubated: 01/18/14 Behavior/Cognition: Alert;Cooperative;Requires  cueing;Distractible Oral Cavity - Dentition: Dentures, top;Dentures, bottom Oral Motor / Sensory Function: Impaired - see Bedside swallow  eval Self-Feeding Abilities: Able to feed self Patient Positioning: Upright in chair Baseline Vocal Quality: Clear Volitional Cough: Strong Volitional Swallow: Able to elicit Anatomy: Other (Comment) (Protrusion at c5/6, prominent CP) Pharyngeal Secretions: Not observed secondary MBS    Reason for Referral Objectively evaluate swallowing function   Oral Phase Oral Preparation/Oral Phase Oral Phase: WFL   Pharyngeal Phase Pharyngeal Phase Pharyngeal Phase: Impaired Pharyngeal - Honey Pharyngeal - Honey Teaspoon: Reduced epiglottic inversion;Reduced  anterior laryngeal mobility;Reduced laryngeal  elevation;Pharyngeal residue - valleculae;Pharyngeal residue - cp  segment Penetration/Aspiration details (honey teaspoon): Material does  not enter airway Pharyngeal - Nectar Pharyngeal - Nectar Teaspoon: Reduced epiglottic  inversion;Reduced anterior laryngeal mobility;Reduced laryngeal  elevation;Pharyngeal residue - valleculae;Pharyngeal residue - cp  segment Penetration/Aspiration details (nectar teaspoon): Material does  not enter airway Pharyngeal - Nectar Cup: Reduced epiglottic inversion;Reduced  anterior  laryngeal mobility;Reduced laryngeal  elevation;Pharyngeal residue - valleculae;Pharyngeal residue - cp  segment;Penetration/Aspiration after swallow Penetration/Aspiration details (nectar cup): Material enters  airway, remains ABOVE vocal cords and not ejected out;Material  does not enter airway Pharyngeal - Nectar Straw: Reduced epiglottic inversion;Reduced  anterior laryngeal mobility;Reduced laryngeal  elevation;Pharyngeal residue - valleculae;Pharyngeal residue - cp  segment;Penetration/Aspiration after swallow Penetration/Aspiration details (nectar straw): Material enters  airway, remains ABOVE vocal cords and not ejected out;Material  does not enter airway Pharyngeal - Thin Pharyngeal - Thin Teaspoon: Not tested Pharyngeal - Thin Cup: Reduced epiglottic inversion;Reduced  anterior laryngeal mobility;Reduced laryngeal  elevation;Pharyngeal residue - valleculae;Pharyngeal residue - cp  segment;Penetration/Aspiration after swallow;Trace aspiration Penetration/Aspiration details (thin cup): Material enters  airway, passes BELOW cords without attempt by patient to eject  out (silent aspiration);Material enters airway, CONTACTS cords  and not ejected out Pharyngeal - Thin Straw: Not tested Pharyngeal - Solids Pharyngeal - Puree: Reduced epiglottic inversion;Reduced anterior  laryngeal mobility;Reduced laryngeal elevation;Pharyngeal residue  - valleculae;Pharyngeal residue - cp segment Penetration/Aspiration details (puree): Material does not enter  airway Pharyngeal - Regular: Reduced epiglottic inversion;Reduced  anterior laryngeal mobility;Reduced laryngeal  elevation;Pharyngeal residue - valleculae;Pharyngeal residue - cp  segment Pharyngeal - Pill: Not tested  Cervical Esophageal Phase    GO    Cervical Esophageal Phase Cervical Esophageal Phase: Impaired Cervical Esophageal Phase - Comment Cervical Esophageal Comment: see impression statement        Herbie Baltimore, MA CCC-SLP [phone]  Katherene Ponto Deblois  01/26/2014, 12:48 PM    Dg Swallowing Func-speech Pathology  01/21/2014   Katherene Ponto Deblois, CCC-SLP     01/21/2014 10:58 AM Objective Swallowing Evaluation: Modified Barium Swallowing Study   Patient Details  Name: KERRI ASCHE MRN: [id number] Date of Birth: 01/27/1941  Today's Date: 01/21/2014 Time: 0930-1000 SLP Time Calculation (min): 30 min  Past Medical History:  Past Medical History  Diagnosis Date  . Diabetes mellitus without complication   . Hypertension   . Hypercholesteremia    Past Surgical History:  Past Surgical History  Procedure Laterality Date  .  Prostatectomy    . Colon resection     HPI:  72 year old male with HTN and DM presented 5/27 to ED c/o N/V  since 5/25. In ED found to be in acute renal failure,  hyperkalemic, hypotensive with SBP's in 70s. Started on BiPAP for  hypoxia and increased WOB. Intubated from 5/28 to 6/1. Pt with  ICU delirium. Admission Chest CXR says New confluent bibasilar  pulmonary opacity, nonspecific but consider bilateral pneumonia  or aspiration.      Assessment / Plan / Recommendation Clinical Impression  Dysphagia Diagnosis: Moderate cervical esophageal phase  dysphagia;Severe pharyngeal phase dysphagia;Mild oral phase  dysphagia Clinical impression: Pt demonstrates a primary structural  cervical esophageal dysphagia, present at baseline, that is  likely worsed by pts mental status and generalized weakness. Pt  was awake, but required max cues to keep head up, bring cup to  lips and orally accept POs. Oral phase characterized by slow  transit. Pt noted to have the appearance of bony protrusion at  C5/6 and a prominent cricopharyngeus (no radiologist present to  confirm) as well as reduced laryngeal elevation and hyoid  excursion. These deficits result in incomplete airway closure  with silent aspiration during the swallow and residuals over the  CP segment and in the valleculae that are repentrated after the  swallow even if pt cued to clear his throat. Findings  consistent  with possible admission with aspiration pna. Pt would be at high  risk of aspiration at this time and would benefit from several  days of recovery prior to retesting and attempting POs. Improved  mental status and general strength would be indicators for  retest. Recommend NPO with short term alternate nutrition. SLP  will revisit pt on Monday for possiblity of retest.     Treatment Recommendation  Therapy as outlined in treatment plan below    Diet Recommendation NPO;Alternative means - temporary   Medication Administration: Via alternative means    Other  Recommendations Oral Care Recommendations: Oral care Q4  per protocol   Follow Up Recommendations  Skilled Nursing facility    Frequency and Duration min 3x week  2 weeks   Pertinent Vitals/Pain NA    SLP Swallow Goals     General HPI: 73 year old male with HTN and DM presented 5/27 to  ED c/o N/V since 5/25. In ED found to be in acute renal failure,  hyperkalemic, hypotensive with SBP's in 70s. Started on BiPAP for  hypoxia and increased WOB. Intubated from 5/28 to 6/1. Pt with  ICU delirium. Admission Chest CXR says New confluent bibasilar  pulmonary opacity, nonspecific but consider bilateral pneumonia  or aspiration.  Type of Study: Modified Barium Swallowing Study Reason for Referral: Objectively evaluate swallowing function Previous Swallow Assessment: none in chart Diet Prior to this Study: NPO Temperature Spikes Noted: No Respiratory Status: Nasal cannula History of Recent Intubation: Yes Length of Intubations (days): 5 days Date extubated: 01/18/14 Behavior/Cognition: Alert;Cooperative;Requires  cueing;Distractible Oral Cavity - Dentition: Dentures, top;Dentures, bottom Oral Motor / Sensory Function: Impaired - see Bedside swallow  eval Self-Feeding Abilities: Able to feed self;Needs assist Patient Positioning: Upright in chair Baseline Vocal Quality: Low vocal intensity;Wet Volitional Cough: Strong Volitional Swallow: Able to elicit Anatomy:   (bony protrustion at C5/6, Prominent CP, no radiologist  pres.) Pharyngeal Secretions:  (suspect standing secretions)    Reason for Referral Objectively evaluate swallowing function   Oral Phase Oral Preparation/Oral Phase Oral Phase: Impaired Oral - Honey Oral -  Honey Teaspoon: Delayed oral transit Oral - Nectar Oral - Nectar Teaspoon: Delayed oral transit Oral - Nectar Cup: Delayed oral transit Oral - Thin Oral - Thin Teaspoon: Right anterior bolus loss;Left anterior  bolus loss;Delayed oral transit Oral - Thin Straw: Delayed oral transit (pt struggles to tip head  and cup back for thin cup) Oral - Solids Oral - Puree: Within functional limits   Pharyngeal Phase Pharyngeal Phase Pharyngeal Phase: Impaired Pharyngeal - Honey Pharyngeal - Honey Teaspoon: Reduced anterior laryngeal  mobility;Reduced epiglottic inversion;Reduced laryngeal  elevation;Reduced airway/laryngeal closure;Reduced tongue base  retraction;Penetration/Aspiration after  swallow;Penetration/Aspiration during swallow;Trace  aspiration;Pharyngeal residue - valleculae;Pharyngeal residue -  cp segment;Pharyngeal residue - pyriform sinuses;Compensatory  strategies attempted (Comment) (head turn left - not effective) Penetration/Aspiration details (honey teaspoon): Material enters  airway, CONTACTS cords and not ejected out;Material enters  airway, passes BELOW cords without attempt by patient to eject  out (silent aspiration) Pharyngeal - Nectar Pharyngeal - Nectar Teaspoon: Reduced anterior laryngeal  mobility;Reduced epiglottic inversion;Reduced laryngeal  elevation;Reduced airway/laryngeal closure;Reduced tongue base  retraction;Penetration/Aspiration after  swallow;Penetration/Aspiration during swallow;Trace  aspiration;Pharyngeal residue - valleculae;Pharyngeal residue -  cp segment;Pharyngeal residue - pyriform sinuses;Compensatory  strategies attempted (Comment) Penetration/Aspiration details (nectar teaspoon): Material enters  airway,  CONTACTS cords and not ejected out;Material enters  airway, passes BELOW cords without attempt by patient to eject  out (silent aspiration) Pharyngeal - Nectar Cup: Reduced anterior laryngeal  mobility;Reduced epiglottic inversion;Reduced laryngeal  elevation;Reduced airway/laryngeal closure;Reduced tongue base  retraction;Penetration/Aspiration after  swallow;Penetration/Aspiration during swallow;Trace  aspiration;Pharyngeal residue - valleculae;Pharyngeal residue -  cp segment;Pharyngeal residue - pyriform sinuses;Compensatory  strategies attempted (Comment) Penetration/Aspiration details (nectar cup): Material enters  airway, CONTACTS cords and not ejected out;Material enters  airway, passes BELOW cords without attempt by patient to eject  out (silent aspiration) Pharyngeal - Thin Pharyngeal - Thin Teaspoon: Not tested (all spilled from mouth) Pharyngeal - Thin Straw: Reduced anterior laryngeal  mobility;Reduced epiglottic inversion;Reduced laryngeal  elevation;Reduced airway/laryngeal closure;Reduced tongue base  retraction;Penetration/Aspiration during swallow;Pharyngeal  residue - valleculae;Pharyngeal residue - cp segment;Pharyngeal  residue - pyriform sinuses;Compensatory strategies attempted  (Comment);Moderate aspiration Penetration/Aspiration details (thin straw): Material enters  airway, passes BELOW cords without attempt by patient to eject  out (silent aspiration) Pharyngeal - Solids Pharyngeal - Puree: Reduced anterior laryngeal mobility;Reduced  epiglottic inversion;Reduced laryngeal elevation;Reduced  airway/laryngeal closure;Reduced tongue base  retraction;Pharyngeal residue - valleculae;Pharyngeal residue -  cp segment;Pharyngeal residue - pyriform sinuses;Compensatory  strategies attempted (Comment) Penetration/Aspiration details (puree): Material does not enter  airway  Cervical Esophageal Phase    GO    Cervical Esophageal Phase Cervical Esophageal Phase: Impaired Cervical Esophageal Phase -  Comment Cervical Esophageal Comment: see impression statement        Herbie Baltimore, MA CCC-SLP [phone]  Katherene Ponto Deblois 01/21/2014, 10:56 AM     Medications: Scheduled Meds: . antiseptic oral rinse  15 mL Mouth Rinse BID  . aspirin  81 mg Oral Daily  . carvedilol  6.25 mg Oral BID WC  . darbepoetin (ARANESP) injection - NON-DIALYSIS  100 mcg Subcutaneous Q Tue-HD  . insulin aspart  0-24 Units Subcutaneous TID WC  . pantoprazole  40 mg Oral QAC breakfast      LOS: 42 days   Lexani Corona M.D. [facility] 02/09/2014, 1:14 PM Pager: [phone]  If 7PM-7AM, please contact night-coverage [email or web address] Password [email or web address]  **Disclaimer: This note was dictated with voice recognition software. Similar sounding words can inadvertently be transcribed and this note may contain transcription errors which may not have  been corrected upon publication of note.**

## 2014-02-09 NOTE — Progress Notes (Signed)
Occupational Therapy Treatment Patient Details Name: [name] MRN: [id number] DOB: 08-06-1941 Today's Date: 02/09/2014    History of present illness 73 year old male with HTN and DM presented 5/27 to ED c/o N/V since 5/25. In ED found to be in acute renal failure, hyperkalemic, hypotensive with SBP's in 70s. Started on BiPAP for hypoxia and increased WOB. intubated 5/28 and extubated 6/1 Pt currently with Hemodialysis Catheter Left side of neck   OT comments  This 73 yo male making progress. Will continue to benefit from acute OT with goals updated today (based off of how pt is currently moving all present goals have been met).Post acute OT with follow up at SNF.  Follow Up Recommendations  SNF    Equipment Recommendations  Wheelchair (measurements OT);Wheelchair cushion (measurements OT);3 in 1 bedside comode       Precautions / Restrictions Precautions Precautions: Fall Restrictions Weight Bearing Restrictions: No       Mobility      General bed mobility comments: Pt up in recliner upon arrival  Transfers Overall transfer level: Needs assistance Equipment used: Rolling walker (2 wheeled) Transfers: Sit to/from Stand Sit to Stand: Min assist                 ADL Overall ADL's : Needs assistance/impaired                         Toilet Transfer: Moderate assistance;Ambulation;Regular Toilet;Grab bars   Toileting- Clothing Manipulation and Hygiene: Total assistance;Sit to/from stand                          Cognition   Behavior During Therapy: [facility] for tasks assessed/performed Overall Cognitive Status: Within Functional Limits for tasks assessed                                             Frequency Min 2X/week     Progress Toward Goals  OT Goals(current goals can now be found in the care plan section)  Progress towards OT goals: Progressing toward goals     Plan Discharge plan remains appropriate       End of  Session Equipment Utilized During Treatment: Gait belt;Rolling walker   Activity Tolerance  ("that wore me out" at the end of the session)   Patient Left in chair   Nurse Communication  (NT: pt will substantial bowel movement)        Time: 2706-2376 OT Time Calculation (min): 28 min  Charges: OT General Charges $OT Visit: 1 Procedure OT Treatments $Self Care/Home Management : 23-37 mins  Almon Register [phone] 02/09/2014, 11:40 AM

## 2014-02-09 NOTE — Progress Notes (Signed)
Physical Therapy Treatment Patient Details Name: DEMETRICK EICHENBERGER MRN: [id number] DOB: 1940/08/31 Today's Date: 02/09/2014    History of Present Illness 73 year old male with HTN and DM presented 5/27 to ED c/o N/V since 5/25. In ED found to be in acute renal failure, hyperkalemic, hypotensive with SBP's in 70s. Started on BiPAP for hypoxia and increased WOB. intubated 5/28 and extubated 6/1 Pt currently with Hemodialysis Catheter Left side of neck    PT Comments    Excellent progress with progressive ambulation and activity tolerance today; Pt also in better spirits, and happy with his performance  Follow Up Recommendations  SNF;Supervision/Assistance - 24 hour     Equipment Recommendations  Rolling walker with 5" wheels;3in1 (PT)    Recommendations for Other Services       Precautions / Restrictions Precautions Precautions: Fall Restrictions Weight Bearing Restrictions: No    Mobility  Bed Mobility Overal bed mobility: Needs Assistance Bed Mobility: Supine to Sit     Supine to sit: Min assist;HOB elevated     General bed mobility comments: assist for LEs, required extra time  Transfers Overall transfer level: Needs assistance Equipment used: Rolling walker (2 wheeled) Transfers: Sit to/from Stand Sit to Stand: Min assist         General transfer comment: Cues for hand placement and safety; Noted very good rise  Ambulation/Gait Ambulation/Gait assistance: Min assist;+2 safety/equipment (push chair) Ambulation Distance (Feet): 70 Feet Assistive device: Rolling walker (2 wheeled) Gait Pattern/deviations: Step-through pattern Gait velocity: slowed   General Gait Details: Continued cues for posture and to self-monitor for activity tolerance   Stairs            Wheelchair Mobility    Modified Rankin (Stroke Patients Only)       Balance             Standing balance-Leahy Scale: Fair                      Cognition Arousal/Alertness:  Awake/alert Behavior During Therapy: WFL for tasks assessed/performed Overall Cognitive Status: Within Functional Limits for tasks assessed                      Exercises      General Comments        Pertinent Vitals/Pain no apparent distress     Home Living                      Prior Function            PT Goals (current goals can now be found in the care plan section) Acute Rehab PT Goals PT Goal Formulation: With patient Time For Goal Achievement: 02/18/14 Potential to Achieve Goals: Good Progress towards PT goals: Goals met and updated - see care plan    Frequency  Min 3X/week    PT Plan Current plan remains appropriate    Co-evaluation             End of Session Equipment Utilized During Treatment: Gait belt Activity Tolerance: Patient tolerated treatment well Patient left: in chair;with call bell/phone within reach     Time: 0850-0922 PT Time Calculation (min): 32 min  Charges:  $Gait Training: 23-37 mins                    G Codes:      Quin Hoop 02/09/2014, 10:31 AM  Roney Marion, PT  Acute  Rehabilitation Services Pager [phone] Office [phone]

## 2014-02-09 NOTE — Progress Notes (Signed)
Background: 73 year old male with  hypertension, diabetes who presented to the ED on 5/27 with N/V. Found to be in severe acute renal failure with a creatinine of 9.36, potassium greater than 7.7, hypotensive, hypoxic, likely from aspiration pneumonia from vomiting with volume overload and started on BiPAP. Admitted to the critical care service, started on pressor support, CVVHD and intubated. CRRT stopped 6/2 and transitioned to IHD.  He is currently making urine. Right TDC replaced  6/14 by interventional radiology.  Assessment /Plans 1. AKI due to ATN related to NSAID, ACEi, hypovolemia, PNA, Septic shock, CRRT 5/28-6/2, now getting prn HD. Found out  that creatinine was 1.05 in May of this year so that makes me much more optimistic that he will recover. Creatinine had been rising in interdialytic interval but is improved from yesterday to today.  Last HD was 6/18. Vol status good. There are no indications for HD today.   if creatine continues to trend down we may be able to remove the [facility] and get him to SNF- maybe Wednesday or Thursday ? 2. Anemia - s/p PRBC's with HD 6/14; tsat low but ferritin of 2000 precludes use of Fe with HD. Added darbe 100/week [place]  (6/15) 3. HTN - meds. Increased carvedilol to 6.25 BID- seems better today 4. DM - SSI 5. S/p PNA (aspiration- completed ATB's) 6. Diastolic CHF - stable and not in CHF now. 7. Atrial flutter - occurred on dialysis 6/16; has not had this issue prior. Responded to IV lopressor.  8. S/p NTEMI - demand ischemia in setting of septic shock on adm  Subjective: Feels better today Denies pain or SOB Looks less depressed  Objective: Vital signs in last 24 hours: Temp:  [98.2 F (36.8 C)-98.7 F (37.1 C)] 98.2 F (36.8 C) (06/23 0944) Pulse Rate:  [78-88] 86 (06/23 0944) Resp:  [18] 18 (06/23 0944) BP: (129-168)/(72-100) 135/80 mmHg (06/23 0944) SpO2:  [96 %-98 %] 96 % (06/23 0944) Weight:  [78.926 kg (174 lb)] 78.926 kg (174 lb) (06/23  0453) Weight change: 2.026 kg (4 lb 7.5 oz)  Intake/Output from previous day: 06/22 0701 - 06/23 0700 In: 720 [P.O.:720] Out: 1075 [Urine:1075] Intake/Output this shift: Total I/O In: -  Out: 600 [Urine:600] BP 135/80  Pulse 86  Temp(Src) 98.2 F (36.8 C) (Oral)  Resp 18  Ht 5\' 11"  (1.803 m)  Wt 78.926 kg (174 lb)  BMI 24.28 kg/m2  SpO2 96%  Physical examination VS as noted NAD Regular S1S2 No S3 Lungs grossly clear TDC dressing intact right chest Abd soft and nontender No edema whatsoever of the LE's   Recent Labs  02/08/14 0350 02/09/14 0455  WBC 11.5* 10.4  HGB 8.8* 8.6*  HCT 27.7* 28.0*  PLT 222 247    Recent Labs  02/08/14 0350 02/09/14 0455  NA 139 141  K 3.8 3.9  CL 98 101  CO2 24 23  GLUCOSE 108* 102*  BUN 53* 55*  CREATININE 5.20* 4.95*  CALCIUM 8.3* 8.2*  Phos 5.5  Results for NEDIM, OKI (MRN [id number]) as of 02/02/2014 12:43  Ref. Range 02/02/2014 05:07  Iron Latest Range: 42-135 ug/dL 29 (L)  UIBC Latest Range: 125-400 ug/dL 164  TIBC Latest Range: 215-435 ug/dL 193 (L)  Saturation Ratios Latest Range: 20-55 % 15 (L)  Ferritin Latest Range: 22-322 ng/mL 2065 (H)   Scheduled Medications: . antiseptic oral rinse  15 mL Mouth Rinse BID  . aspirin  81 mg Oral Daily  . carvedilol  6.25  mg Oral BID WC  . darbepoetin (ARANESP) injection - NON-DIALYSIS  100 mcg Subcutaneous Q Tue-HD  . insulin aspart  0-24 Units Subcutaneous TID WC  . pantoprazole  40 mg Oral QAC breakfast    LOS: 27 days   GOLDSBOROUGH,KELLIE A 02/09/2014,12:33 PM

## 2014-02-10 DIAGNOSIS — E872 Acidosis, unspecified: Secondary | ICD-10-CM

## 2014-02-10 DIAGNOSIS — E875 Hyperkalemia: Secondary | ICD-10-CM

## 2014-02-10 DIAGNOSIS — D696 Thrombocytopenia, unspecified: Secondary | ICD-10-CM

## 2014-02-10 DIAGNOSIS — A419 Sepsis, unspecified organism: Secondary | ICD-10-CM

## 2014-02-10 DIAGNOSIS — N179 Acute kidney failure, unspecified: Secondary | ICD-10-CM

## 2014-02-10 LAB — GLUCOSE, CAPILLARY
GLUCOSE-CAPILLARY: 143 mg/dL — AB (ref 70–99)
GLUCOSE-CAPILLARY: 83 mg/dL (ref 70–99)
Glucose-Capillary: 99 mg/dL (ref 70–99)
Glucose-Capillary: 183 mg/dL — ABNORMAL HIGH (ref 70–99)

## 2014-02-10 LAB — CBC
HCT: 27.7 % — ABNORMAL LOW (ref 39.0–52.0)
HEMOGLOBIN: 8.7 g/dL — AB (ref 13.0–17.0)
MCH: 28.2 pg (ref 26.0–34.0)
MCHC: 31.4 g/dL (ref 30.0–36.0)
MCV: 89.6 fL (ref 78.0–100.0)
Platelets: 304 10*3/uL (ref 150–400)
RBC: 3.09 MIL/uL — ABNORMAL LOW (ref 4.22–5.81)
RDW: 15.2 % (ref 11.5–15.5)
WBC: 11.2 10*3/uL — ABNORMAL HIGH (ref 4.0–10.5)

## 2014-02-10 LAB — BASIC METABOLIC PANEL
BUN: 56 mg/dL — AB (ref 6–23)
CHLORIDE: 100 meq/L (ref 96–112)
CO2: 23 mEq/L (ref 19–32)
Calcium: 8.5 mg/dL (ref 8.4–10.5)
Creatinine, Ser: 4.51 mg/dL — ABNORMAL HIGH (ref 0.50–1.35)
GFR calc Af Amer: 14 mL/min — ABNORMAL LOW (ref >90)
GFR calc non Af Amer: 12 mL/min — ABNORMAL LOW (ref >90)
GLUCOSE: 90 mg/dL (ref 70–99)
POTASSIUM: 4.3 meq/L (ref 3.7–5.3)
Sodium: 141 mEq/L (ref 137–147)

## 2014-02-10 MED ORDER — DARBEPOETIN ALFA-POLYSORBATE 100 MCG/0.5ML IJ SOLN
100.0000 ug | INTRAMUSCULAR | Status: DC
  Administered 2014-02-10: 100 ug via SUBCUTANEOUS
  Filled 2014-02-10: qty 0.5

## 2014-02-10 NOTE — Progress Notes (Signed)
Spoke to Dr. Moshe Cipro about patient's discharge medications. She stated patient will NOT be discharged on SQ Aranesp.  Joellen Jersey, RN.

## 2014-02-10 NOTE — Progress Notes (Signed)
[facility] PROGRESS NOTE  [name]:[id number] DOB: 1941/05/15 DOA: 01/13/2014 PCP: No primary provider on file.  Assessment/Plan: Active Problems:   Severe sepsis   Acute renal failure   Metabolic acidosis   Hyperkalemia   Thrombocytopenia   Anemia   Aspiration pneumonia   Diabetes mellitus   Dysphagia   HTN (hypertension)   Other and unspecified hyperlipidemia   Overweight(278.02)   Aortic stenosis, moderate   Chronic diastolic heart failure   NSTEMI (non-ST elevated myocardial infarction)   Toe ulcer   Atrial flutter    Patient is a 73 year old male with past medical history hypertension diabetes who presented to the emergency room on 5/27 with complaints of nausea and vomiting x2 days. The patient was found to be in severe acute renal failure with a creatinine of 9.36, potassium greater than 7.7 and was hypotensive. Patient noted to be hypoxic, likely from aspiration pneumonia from vomiting with volume overload and was started on BiPAP. He was admitted to the critical care service. Nephrology was consulted. Patient was started on pressor support, CVVH and was intubated.  ARF was felt to be multifactorial from hypovolemia, NSAID-induced nephropathy plus ACE inhibitor. Also high anion gap metabolic acidosis noted with metformin as contributing factor. Renal ultrasound notes medical renal disease. Chest x-ray noted pneumonia and/or pulmonary edema. Patient was treated as septic shock with pressor support. Mildly elevated troponin plus questionable EKG changes, felt to have mild non-STEMI secondary to demand ischemia. He was started on on tube feedings.  Pt significantly improved and was extubated 6/1. Pressors and stress dose steroids were weaned off. Antibiotics were completed on 6/3. Patient was continued on tube feedings as there was concerns about chronic aspiration due to to decreased mentation even after extubation. Patient failed initial swallow eval 6/3. CERT was  stopped on 6/2 and hemodialysis was initiated as the renal function did not improve. Patient was transferred to the floor to hospitalist service on 6/5.  Patient's mentation has continued to improve. Speech therapy continue to follow and after modified barium swallow on 6/9, patient upgraded to dysphagia 3 diet with nectar thick liquids. Patient intermittently dialyzed with hopes renal function will improve. The patient's last dialysis was 6/18. He is currently making urine. However, dialysis catheter accidentally removed by patient on 6/11 early morning. Catheter replaced on 6/14 by interventional radiology.  Patient was followed by speech therapy on 6/16 who upgraded him to a dysphagia 3 diet with thin liquids  On 6/16, Patient started developing episodes of atrial flutter during dialysis, was treated with IV Lopressor.  Per nephrology, no indications for hemodialysis today, monitor creatinine, if continues to trend down, may be able to remove the [facility] and get him to SNF.    Assessment/Plan:  Principal Problem:  Hypotensive/Septic shock, aspiration pneumonia: Resolved. Patient met criteria given hypotension, end organ damage and required pressor support. Source felt to be pneumonia secondary to aspiration.  - Completed antibiotic course, continue dysphagia 3 diet.    Acute renal failure now in the setting of chronic renal disease. Improving, Nephrology following. Baseline Cr 1.05 in May 2015  - Creatinine 4.51, last HD 6/18, Catheter replaced 6/14  - Renal recommendations reviewed, Last HD was 6/18. Vol status good, making urine. There are no indications for HD today, creatinine down. Nephrology to remove PC   Metabolic acidosis: Secondary to uremia. Initially from septic shock, resolved.   Hyperkalemia: Secondary renal failure. Initially treated with dialysis.  - Resolved   Thrombocytopenia: Resolved. Secondary to thrombocytopenia  critical illness.   Anemia: Secondary to chronic renal  disease and illness.   Atrial flutter: On 6/16, Patient started developing episodes of significant tachycardia during dialysis. Rhythm assessment found to be aflutter.  Coreg increased to 6.25 twice a day  Diabetes mellitus:  - CBG stable. On sliding scale only. A1c 5.5.  - At this point, discontinue metformin permanently given renal failure and try to manage with dietary control only    Dysphagia: In part due to mentation.  - Upgraded by speech therapy to dysphagia 3, thin liquids. Patient has history of presbyesophagus. Speech therapy should follow even at skilled nursing facility    HTN: On Coreg stable  Hyperlipidemia    Chronic Diastolic heart failure: Echocardiogram done in the setting of ?Non-STEMI. Grade 2 diastolic dysfunction noted.  NSTEMI: Demand ischemia in the setting of septic shock. Resolved.    Toe ulceration: Patient was scheduled by orthopedic surgery to have planned left fourth toe amputation done 6/25. Hospitalist left message for need for postponement    DVT Prophylaxis:  Code Status:  Family Communication: Discussed with patient at bedside, discussed with wife in detail yesterday  Disposition: Awaiting renal to remove PC, DC to SNF tomorrow,[facility]   Consultants:  nephrology Critical Care  Interventional radiology  Speech therapy  Procedures:  Status post intubation on 5/28-extubated 6/1  Temporary dialysis catheter placed 5/28, patient removed it on 6/11  CVVH started 5/20  Echo 5/28 >> EF 65 to 15%, grade 2 diastolic dysfx, mod AS  Placement of dialysis catheter by interventional radiology on 6/14 Antibiotics:  Zosyn 5/27 >>> 6/03  Vancomycin 5/27 >>> 6/02  Subjective:  Patient seen and examined, tolerating diet, feels a whole lot better, awaiting final decision from nephrology, hopeful that he would not need hemodialysis   Objective: Filed Vitals:   02/10/14 0513 02/10/14 0537 02/10/14 0900 02/10/14 1140  BP:  150/77  138/72 117/58  Pulse:  77 88   Temp:  99.4 F (37.4 C) 98.4 F (36.9 C)   TempSrc: Oral Oral Oral   Resp:  18 20   Height:      Weight:      SpO2:  98% 98%     Intake/Output Summary (Last 24 hours) at 02/10/14 1648 Last data filed at 02/10/14 1355  Gross per 24 hour  Intake    840 ml  Output    750 ml  Net     90 ml    Exam:  HENT:  Head: Atraumatic.  Nose: Nose normal.  Mouth/Throat: Oropharynx is clear and moist.  Eyes: Conjunctivae are normal. Pupils are equal, round, and reactive to light. No scleral icterus.  Neck: Neck supple. No tracheal deviation present.  Cardiovascular: Normal rate, regular rhythm, normal heart sounds and intact distal pulses.  Pulmonary/Chest: Effort normal and breath sounds normal. No respiratory distress.  Abdominal: Soft. Normal appearance and bowel sounds are normal. She exhibits no distension. There is no tenderness.  Musculoskeletal: She exhibits no edema and no tenderness.  Neurological: She is alert. No cranial nerve deficit.    Data Reviewed: Basic Metabolic Panel:  Recent Labs Lab 02/04/14 0545 02/05/14 0810 02/06/14 1024 02/07/14 0505 02/08/14 0350 02/09/14 0455 02/10/14 0527  NA 140 137 139 139 139 141 141  K 3.9 3.7 3.6* 3.4* 3.8 3.9 4.3  CL 95* 96 97 97 98 101 100  CO2 25 27 24 24 24 23 23   GLUCOSE 104* 105* 177* 109* 108* 102* 90  BUN 66* 24*  39* 48* 53* 55* 56*  CREATININE 5.84* 3.19* 4.53* 5.22* 5.20* 4.95* 4.51*  CALCIUM 8.0* 8.1* 8.0* 8.2* 8.3* 8.2* 8.5  PHOS 7.6* 4.2 4.8* 5.5* 5.1*  --   --     Liver Function Tests:  Recent Labs Lab 02/04/14 0545 02/05/14 0810 02/06/14 1024 02/07/14 0505 02/08/14 0350  ALBUMIN 2.3* 2.2* 2.2* 2.3* 2.2*   No results found for this basename: LIPASE, AMYLASE,  in the last 168 hours No results found for this basename: AMMONIA,  in the last 168 hours  CBC:  Recent Labs Lab 02/04/14 0541 02/06/14 1031 02/08/14 0350 02/09/14 0455 02/10/14 0527  WBC 10.1 11.2*  11.5* 10.4 11.2*  HGB 9.3* 8.8* 8.8* 8.6* 8.7*  HCT 29.5* 28.0* 27.7* 28.0* 27.7*  MCV 89.7 90.3 89.9 90.6 89.6  PLT 249 207 222 247 304    Cardiac Enzymes: No results found for this basename: CKTOTAL, CKMB, CKMBINDEX, TROPONINI,  in the last 168 hours BNP (last 3 results)  Recent Labs  01/13/14 2125  PROBNP 35530.0*     CBG:  Recent Labs Lab 02/09/14 0726 02/09/14 1222 02/09/14 1659 02/09/14 2105 02/10/14 0758  GLUCAP 100* 113* 123* 118* 99    No results found for this or any previous visit (from the past 240 hour(s)).   Studies: Dg Abd 1 View  01/23/2014   CLINICAL DATA:  Feeding tube placement.  EXAM: ABDOMEN - 1 VIEW  COMPARISON:  01/21/2014  FINDINGS: Feeding tube tip is seen with tip in the fundus of the stomach. Some residual contrast is seen within the colon. No dilated bowel loops seen.  IMPRESSION: Feeding tube tip in fundus of stomach.   Electronically Signed   By: Earle Gell M.D.   On: 01/23/2014 18:57   Dg Esophagus  01/26/2014   CLINICAL DATA:  Dysphagia. Modified swallow earlier in the morning only clearing patient for nectar thickness.  EXAM: ESOPHOGRAM/BARIUM SWALLOW  TECHNIQUE: Single contrast examination was performed using  thick barium.  FLUOROSCOPY TIME:  3 min and 20 is seconds  COMPARISON:  Chest radiograph 01/19/2014.  FINDINGS: Focused single-contrast exam was performed in a supine, mildly LPO position. The bed was maintained partially upright.  Evaluation of a single swallow demonstrates contrast stasis throughout the mid and upper thoracic esophagus.  Full column evaluation of the esophagus demonstrates no persistent narrowing or stricture.  13 mm barium tablet has initial delay in the vallecula. On subsequent swallows, the tablet passes into the esophagus and then into the stomach.  IMPRESSION: 1. Moderately degraded exam. 2. 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 3. No evidence of  stricture other anatomic cause for dysphagia. 4.  Delayed passage of the barium tablet at the level of the vallecula.   Electronically Signed   By: Abigail Miyamoto M.D.   On: 01/26/2014 16:14   US Renal  01/14/2014   CLINICAL DATA:  Renal failure, history hypertension, diabetes  EXAM: RENAL/URINARY TRACT ULTRASOUND COMPLETE  COMPARISON:  None  FINDINGS: Right Kidney:  Length: 12.4 cm length. Cortical thickness up to 1.8 cm thick. Increased cortical echogenicity. Hypoechoic nodule medial aspect mid RIGHT kidney 2.1 x 2.2 x 2.1 cm containing scattered internal echoes question complicated cyst. No additional mass, hydronephrosis or shadowing calcification.  Left Kidney:  Length: 10.5 cm length. Cortical thickness up to 2.0 cm thick. Increased cortical echogenicity. Minimally complicated cyst at mid LEFT kidney 2.7 x 3.3 x 3.7 cm containing slightly irregular wall and scattered internal echoes. No additional mass, hydronephrosis or shadowing  calcification.  Bladder:  Contains only minimal urine. Minimally enlarged central lobe of prostate.  IMPRESSION: Increased renal cortical echogenicity bilaterally consistent with medical renal disease.  Mildly complicated cysts within the mid portions of both kidneys.  No evidence of hydronephrosis.   Electronically Signed   By: Lavonia Dana M.D.   On: 01/14/2014 02:31   Ir Fluoro Guide Cv Line Right  01/31/2014   INDICATION: Renal failure and needs access for hemodialysis.  EXAM: FLUOROSCOPIC AND ULTRASOUND GUIDED PLACEMENT OF A TUNNELED DIALYSIS CATHETER  Physician: Stephan Minister. Anselm Pancoast, MD  FLUOROSCOPY TIME:  24 seconds  MEDICATIONS: 0.5 mg versed, 25 mcg fentanyl. A radiology nurse monitored the patient for moderate sedation.  ANESTHESIA/SEDATION: Moderate sedation time: 19 minutes  PROCEDURE: Informed consent was obtained for placement of a tunneled dialysis catheter. The patient was placed supine on the interventional table. Ultrasound confirmed a patent right internal jugularvein. Ultrasound images were obtained for documentation.  The right side of the neck was prepped and draped in a sterile fashion. The right side of the neck was anesthetized with 1% lidocaine. Maximal barrier sterile technique was utilized including caps, mask, sterile gowns, sterile gloves, sterile drape, hand hygiene and skin antiseptic. A small incision was made with #11 blade scalpel. A 21 gauge needle directed into the right internal jugular vein with ultrasound guidance. A micropuncture dilator set was placed. A 19 cm tip to cuff HemoSplit catheter was selected. The skin below the right clavicle was anesthetized and a small incision was made with an #11 blade scalpel. A subcutaneous tunnel was formed to the vein dermatotomy site. The catheter was brought through the tunnel. The vein dermatotomy site was dilated to accommodate a peel-away sheath. The catheter was placed through the peel-away sheath and directed into the central venous structures. The tip of the catheter was placed in the lower SVC with fluoroscopy. Fluoroscopic images were obtained for documentation. Both lumens were found to aspirate and flush well. The proper amount of heparin was flushed in both lumens. The vein dermatotomy site was closed using a single layer of absorbable suture and Dermabond. The catheter was secured to the skin using Prolene suture.  FINDINGS: Catheter tip in the lower SVC.  COMPLICATIONS: None  IMPRESSION: Successful placement of a right jugular tunneled dialysis catheter using ultrasound and fluoroscopic guidance.   Electronically Signed   By: Markus Daft M.D.   On: 01/31/2014 13:45   Ir US Guide Vasc Access Right  01/31/2014   INDICATION: Renal failure and needs access for hemodialysis.  EXAM: FLUOROSCOPIC AND ULTRASOUND GUIDED PLACEMENT OF A TUNNELED DIALYSIS CATHETER  Physician: Stephan Minister. Anselm Pancoast, MD  FLUOROSCOPY TIME:  24 seconds  MEDICATIONS: 0.5 mg versed, 25 mcg fentanyl. A radiology nurse monitored the patient for moderate sedation.  ANESTHESIA/SEDATION: Moderate  sedation time: 19 minutes  PROCEDURE: Informed consent was obtained for placement of a tunneled dialysis catheter. The patient was placed supine on the interventional table. Ultrasound confirmed a patent right internal jugularvein. Ultrasound images were obtained for documentation. The right side of the neck was prepped and draped in a sterile fashion. The right side of the neck was anesthetized with 1% lidocaine. Maximal barrier sterile technique was utilized including caps, mask, sterile gowns, sterile gloves, sterile drape, hand hygiene and skin antiseptic. A small incision was made with #11 blade scalpel. A 21 gauge needle directed into the right internal jugular vein with ultrasound guidance. A micropuncture dilator set was placed. A 19 cm tip to cuff HemoSplit catheter  was selected. The skin below the right clavicle was anesthetized and a small incision was made with an #11 blade scalpel. A subcutaneous tunnel was formed to the vein dermatotomy site. The catheter was brought through the tunnel. The vein dermatotomy site was dilated to accommodate a peel-away sheath. The catheter was placed through the peel-away sheath and directed into the central venous structures. The tip of the catheter was placed in the lower SVC with fluoroscopy. Fluoroscopic images were obtained for documentation. Both lumens were found to aspirate and flush well. The proper amount of heparin was flushed in both lumens. The vein dermatotomy site was closed using a single layer of absorbable suture and Dermabond. The catheter was secured to the skin using Prolene suture.  FINDINGS: Catheter tip in the lower SVC.  COMPLICATIONS: None  IMPRESSION: Successful placement of a right jugular tunneled dialysis catheter using ultrasound and fluoroscopic guidance.   Electronically Signed   By: Markus Daft M.D.   On: 01/31/2014 13:45   Dg Chest Port 1 View  01/19/2014   CLINICAL DATA:  Cough  EXAM: PORTABLE CHEST - 1 VIEW  COMPARISON:  01/18/2014   FINDINGS: Cardiac shadow is stable. A right jugular line and left jugular temporary dialysis catheter are again seen and stable. The endotracheal tube and nasogastric catheter have been removed. The lungs are well aerated bilaterally. Improved aeration is again seen bilaterally with persistent changes in the right lung base. No new focal infiltrate is seen.  IMPRESSION: Improved aeration with some persistent changes in the right base.  Tubes and lines as described.   Electronically Signed   By: Inez Catalina M.D.   On: 01/19/2014 06:53   Dg Chest Port 1 View  01/18/2014   CLINICAL DATA:  Check endotracheal tube position.  EXAM: PORTABLE CHEST - 1 VIEW  COMPARISON:  01/16/2014.  FINDINGS: Endotracheal tube terminates approximately 3.1 cm above the carina. Nasogastric tube is followed into the stomach. Right IJ central line tip projects over the low SVC. Left IJ catheter tip projects over the brachiocephalic vein confluence or upper SVC. Heart is enlarged, stable. Thoracic aorta is calcified. There is mild-to-moderate diffuse bilateral airspace disease with slight improvement in aeration at the right lung base. No definite pleural fluid.  IMPRESSION: Mild to moderate diffuse bilateral airspace disease, likely due to pulmonary edema. Slight improvement in aeration at the right lung base.   Electronically Signed   By: Lorin Picket M.D.   On: 01/18/2014 07:40   Dg Chest Port 1 View  01/16/2014   CLINICAL DATA:  Aspiration pneumonia  EXAM: PORTABLE CHEST - 1 VIEW  COMPARISON:  01/15/2014  FINDINGS: Endotracheal tube ends in the mid thoracic trachea. Unchanged positioning of bilateral IJ catheters, tips at the level of the SVC. Enteric tube enters the stomach.  Bibasilar airspace disease shows no progression or clearing. No evidence of increasing pleural fluid or air leak.  IMPRESSION: 1. Stable positioning of tubes and lines. 2. Unchanged bibasilar pneumonia.   Electronically Signed   By: Jorje Guild M.D.   On:  01/16/2014 07:18   Dg Chest Port 1 View  01/15/2014   CLINICAL DATA:  Ventilator support.  Pneumonia.  EXAM: PORTABLE CHEST - 1 VIEW  COMPARISON:  01/14/2014  FINDINGS: Endotracheal tube has its tip 4 cm above the carina. Nasogastric tube enters the stomach. Right internal jugular central line has its tip in the SVC above the right atrium. Left internal jugular central line tips are in the SVC above  the right atrium. Bilateral lower lobe airspace filling consistent with pneumonia persists, probably slightly improved since yesterday. The upper lungs remain clear. No effusions.  IMPRESSION: Lines and tubes well positioned. Slight improvement in bilateral lower lobe pneumonia.   Electronically Signed   By: Nelson Chimes M.D.   On: 01/15/2014 07:35   Dg Chest Port 1 View  01/14/2014   CLINICAL DATA:  Status post central line placement  EXAM: PORTABLE CHEST - 1 VIEW  COMPARISON:  01/14/2014 225 hr  FINDINGS: A left jugular dual-lumen central line is again identified and stable. A new right jugular central line is seen with the catheter tip at the cavoatrial junction. No pneumothorax is noted. The endotracheal tube is seen 5 cm above the carina. A nasogastric catheter is noted extending into the stomach. Bilateral lower lung infiltrates are seen. The overall appearance is stable. No new focal abnormality is noted.  IMPRESSION: Stable bilateral infiltrates.  New tubes and lines as described above without complicating factors.   Electronically Signed   By: Inez Catalina M.D.   On: 01/14/2014 09:33   Dg Chest Port 1 View  01/14/2014   CLINICAL DATA:  Hemodialysis catheter placement  EXAM: Portable exam 0225 hr compared to 01/13/2014  COMPARISON:  None.  FINDINGS: LEFT jugular dual-lumen central venous catheter with tip projecting over mid SVC.  Enlargement of cardiac silhouette with pulmonary vascular congestion.  Perihilar and basilar infiltrates which could represent edema or infection.  No pneumothorax.  Bones  unremarkable.  IMPRESSION: No pneumothorax following LEFT jugular line placement.  Enlargement of cardiac silhouette with pulmonary vascular congestion and diffuse increased pulmonary infiltrates, question edema versus infection.   Electronically Signed   By: Lavonia Dana M.D.   On: 01/14/2014 02:41   Dg Chest Port 1 View  01/13/2014   CLINICAL DATA:  73 year old male with back and knee pain. Shortness of Breath. Initial encounter.  EXAM: PORTABLE CHEST - 1 VIEW  COMPARISON:  11/21/2007.  FINDINGS: Portable AP supine view at 2211 hrs. New confluent bibasilar pulmonary opacity. No pneumothorax. Pulmonary vascularity within normal limits. Stable cardiac size and mediastinal contours. Visualized tracheal air column is within normal limits.  IMPRESSION: New confluent bibasilar pulmonary opacity, nonspecific but consider bilateral pneumonia or aspiration.   Electronically Signed   By: Lars Pinks M.D.   On: 01/13/2014 23:02   Dg Abd Portable 1v  01/23/2014   CLINICAL DATA:  Feeding tube placement.  EXAM: PORTABLE ABDOMEN - 1 VIEW  COMPARISON:  01/23/2014  FINDINGS: The Dobbhoff feeding tube is looped back on itself in the descending duodenum. The tip is in the antropyloric region of the stomach.  IMPRESSION: Dobbhoff feeding tube is looped back on itself in the descending duodenum.   Electronically Signed   By: Kalman Jewels M.D.   On: 01/23/2014 23:44   Dg Abd Portable 1v  01/21/2014   CLINICAL DATA:  Feeding tube placement.  EXAM: PORTABLE ABDOMEN - 1 VIEW  COMPARISON:  None.  FINDINGS: Weighted tip feeding tube is present in the proximal gastric fundus. Barium is present in small bowel. The bowel gas pattern is nonobstructive. Gaseous distention of the colon.  IMPRESSION: Weighted tip feeding tube in the proximal gastric fundus.   Electronically Signed   By: Dereck Ligas M.D.   On: 01/21/2014 16:32   Dg Swallowing Func-speech Pathology  02/02/2014   Katherene Ponto Deblois, CCC-SLP     02/02/2014 10:53 AM  Objective Swallowing Evaluation: Modified Barium Swallowing Study  Patient Details  Name: TAKOTA CAHALAN MRN: [id number] Date of Birth: [birth year]  Today's Date: 02/02/2014 Time: 1000-1030 SLP Time Calculation (min): 30 min  Past Medical History:  Past Medical History  Diagnosis Date  . Diabetes mellitus without complication   . Hypertension   . Hypercholesteremia    Past Surgical History:  Past Surgical History  Procedure Laterality Date  . Prostatectomy    . Colon resection     HPI:  73 year old male with HTN and DM presented 5/27 to ED c/o N/V  since 5/25. In ED found to be in acute renal failure,  hyperkalemic, hypotensive with SBP's in 70s. Started on BiPAP for  hypoxia and increased WOB. Intubated from 5/28 to 6/1. Pt with  ICU delirium. Admission Chest CXR says New confluent bibasilar  pulmonary opacity, nonspecific but consider bilateral pneumonia  or aspiration. Pt has undergone multiple MBS studies with  yesterday's study showing improved swallow function to allow po  intake with strict precautions.       Assessment / Plan / Recommendation Clinical Impression  Dysphagia Diagnosis: Moderate pharyngeal phase  dysphagia;Moderate cervical esophageal phase dysphagia Clinical impression: Pt demonstrates swallow function that is  likely his baseline. Strength has continued to improve with  minimal oropharygneal residuals even with solid textures. 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 Pt  consistently has trace silent penetration to the cords with small  to normal sips of thin liquids. He had aspiration when trying to  swallow too quickly or tilting his head dramatically back. If pt  clears his throat every 2-3 sips he can expel trace penetrates.  Again this function appears to be pts baseline. Recommend a dys 3  (mechanical soft) diet with thin liquids and an intermittent  throat clear with ongoing therapy to address  compensatory  strategies.     Treatment Recommendation  Therapy as outlined in treatment plan below    Diet Recommendation Dysphagia 3 (Mechanical Soft);Thin liquid   Liquid Administration via: Cup;No straw Medication Administration: Whole meds with puree Supervision: Patient able to self feed;Full supervision/cueing  for compensatory strategies Compensations: Slow rate;Small sips/bites;Clear throat  intermittently;Follow solids with liquid Postural Changes and/or Swallow Maneuvers: Seated upright 90  degrees;Upright 30-60 min after meal;Out of bed for meals    Other  Recommendations Oral Care Recommendations: Oral care BID   Follow Up Recommendations  Inpatient Rehab    Frequency and Duration min 2x/week  2 weeks   Pertinent Vitals/Pain NA    SLP Swallow Goals     General HPI: 73 year old male with HTN and DM presented 5/27 to  ED c/o N/V since 5/25. In ED found to be in acute renal failure,  hyperkalemic, hypotensive with SBP's in 70s. Started on BiPAP for  hypoxia and increased WOB. Intubated from 5/28 to 6/1. Pt with  ICU delirium. Admission Chest CXR says New confluent bibasilar  pulmonary opacity, nonspecific but consider bilateral pneumonia  or aspiration. Pt has undergone multiple MBS studies with  yesterday's study showing improved swallow function to allow po  intake with strict precautions.   Type of Study: Modified Barium Swallowing Study Reason for Referral: Objectively evaluate swallowing function Diet Prior to this Study: Dysphagia 3 (soft);Nectar-thick liquids Temperature Spikes Noted: No Respiratory Status: Room air History of Recent Intubation: Yes Length of Intubations (days): 5 days Date extubated: 01/18/14 Behavior/Cognition: Alert;Cooperative;Pleasant mood Oral Cavity - Dentition: Dentures, top;Dentures, bottom Oral Motor /  Sensory Function: Within functional limits Self-Feeding Abilities: Able to feed self Patient Positioning: Upright in chair Baseline Vocal Quality: Clear Volitional Cough:  Strong Volitional Swallow: Able to elicit Anatomy: Other (Comment) (bony protrustion at C5/6, Prominent CP,  no radiologist pres) Pharyngeal Secretions: Not observed secondary MBS    Reason for Referral Objectively evaluate swallowing function   Oral Phase Oral Preparation/Oral Phase Oral Phase: WFL Oral - Honey Oral - Honey Teaspoon: Not tested Oral - Nectar Oral - Nectar Teaspoon: Not tested Oral - Nectar Cup: Not tested Oral - Thin Oral - Thin Teaspoon: Not tested Oral - Thin Straw: Within functional limits (with a chin tuck) Oral - Solids Oral - Puree: Within functional limits   Pharyngeal Phase Pharyngeal Phase Pharyngeal Phase: Impaired Pharyngeal - Honey Pharyngeal - Honey Teaspoon: Not tested Pharyngeal - Nectar Pharyngeal - Nectar Teaspoon: Not tested Pharyngeal - Nectar Cup: Not tested Pharyngeal - Nectar Straw: Not tested Pharyngeal - Thin Pharyngeal - Thin Teaspoon: Not tested Pharyngeal - Thin Cup: Delayed swallow initiation;Reduced  laryngeal elevation;Reduced airway/laryngeal  closure;Penetration/Aspiration before swallow;Trace aspiration Penetration/Aspiration details (thin cup): Material enters  airway, CONTACTS cords and not ejected out;Material enters  airway, passes BELOW cords without attempt by patient to eject  out (silent aspiration) Pharyngeal - Thin Straw: Delayed swallow initiation;Reduced  laryngeal elevation;Reduced airway/laryngeal  closure;Penetration/Aspiration before swallow;Trace aspiration Penetration/Aspiration details (thin straw): Material enters  airway, CONTACTS cords and not ejected out Pharyngeal - Solids Pharyngeal - Puree: Pharyngeal residue - valleculae;Reduced  laryngeal elevation;Reduced epiglottic inversion Penetration/Aspiration details (puree): Material does not enter  airway Pharyngeal - Regular: Pharyngeal residue - valleculae;Reduced  laryngeal elevation;Reduced epiglottic inversion  Cervical Esophageal Phase    GO    Cervical Esophageal Phase Cervical Esophageal  Phase: Impaired Cervical Esophageal Phase - Comment Cervical Esophageal Comment: see impression statement        Herbie Baltimore, MA CCC-SLP [phone]  Lynann Beaver 02/02/2014, 10:51 AM    Dg Swallowing Func-speech Pathology  01/26/2014   Katherene Ponto Deblois, CCC-SLP     01/26/2014 12:49 PM Objective Swallowing Evaluation: Modified Barium Swallowing Study   Patient Details  Name: [name] MRN: [id number] Date of Birth: Nov 19, 1940  Today's Date: 01/26/2014 Time: 6010-9323 SLP Time Calculation (min): 19 min  Past Medical History:  Past Medical History  Diagnosis Date  . Diabetes mellitus without complication   . Hypertension   . Hypercholesteremia    Past Surgical History:  Past Surgical History  Procedure Laterality Date  . Prostatectomy    . Colon resection     HPI:  73 year old male with HTN and DM presented 5/27 to ED c/o N/V  since 5/25. In ED found to be in acute renal failure,  hyperkalemic, hypotensive with SBP's in 70s. Started on BiPAP for  hypoxia and increased WOB. Intubated from 5/28 to 6/1. Pt with  ICU delirium. Admission Chest CXR says New confluent bibasilar  pulmonary opacity, nonspecific but consider bilateral pneumonia  or aspiration.      Assessment / Plan / Recommendation Clinical Impression  Dysphagia Diagnosis: Mild cervical esophageal phase  dysphagia;Suspected primary esophageal dysphagia;Mild pharyngeal  phase dysphagia Clinical impression: Overall strength and timeliness of swallow  has significantly improved though mild to moderate dyspahgia  persists. Reduced hyoid excursion and base of tongue retraction  leads to mild to moderate vallecular residuals that, with thin  liquids, are silently penetrated/aspirated after the swallow.  Quantity and severity of penetration with nectar thick liquids is  trace and is easily  cleared with occasional throat clear. The  primary problem is likely more related to cervical esophageal and  possibly esopahgeal function. There is a prominent  CP, reduced  opening of UES and pt was noted to have significant esophageal  stasis building up to proximal esophagus with one instance of  backflow to pharynx. Pt is recommended to consume a dys 3  (mechanical soft) diet with nectar thick liquids, intermittent  throat clear and esophageal precautions. Discussed concerns with  MD. Kerby Moors diet will reduce risk, but still pt may aspirate  esophageal backflow.     Treatment Recommendation  Therapy as outlined in treatment plan below    Diet Recommendation Dysphagia 3 (Mechanical Soft);Nectar-thick  liquid   Liquid Administration via: Cup;Straw Medication Administration: Crushed with puree Supervision: Patient able to self feed;Full supervision/cueing  for compensatory strategies Compensations: Slow rate;Small sips/bites;Clear throat  intermittently;Follow solids with liquid Postural Changes and/or Swallow Maneuvers: Seated upright 90  degrees;Upright 30-60 min after meal;Out of bed for meals    Other  Recommendations Oral Care Recommendations: Oral care BID Other Recommendations: Order thickener from pharmacy   Follow Up Recommendations  Inpatient Rehab    Frequency and Duration min 2x/week  2 weeks   Pertinent Vitals/Pain NA    SLP Swallow Goals     General HPI: 73 year old male with HTN and DM presented 5/27 to  ED c/o N/V since 5/25. In ED found to be in acute renal failure,  hyperkalemic, hypotensive with SBP's in 70s. Started on BiPAP for  hypoxia and increased WOB. Intubated from 5/28 to 6/1. Pt with  ICU delirium. Admission Chest CXR says New confluent bibasilar  pulmonary opacity, nonspecific but consider bilateral pneumonia  or aspiration.  Type of Study: Modified Barium Swallowing Study Reason for Referral: Objectively evaluate swallowing function Previous Swallow Assessment: MBS 01/21/14 - NPO Diet Prior to this Study: NPO;Panda Temperature Spikes Noted: No Respiratory Status: Room air History of Recent Intubation: Yes Length of Intubations (days): 5 days  Date extubated: 01/18/14 Behavior/Cognition: Alert;Cooperative;Requires  cueing;Distractible Oral Cavity - Dentition: Dentures, top;Dentures, bottom Oral Motor / Sensory Function: Impaired - see Bedside swallow  eval Self-Feeding Abilities: Able to feed self Patient Positioning: Upright in chair Baseline Vocal Quality: Clear Volitional Cough: Strong Volitional Swallow: Able to elicit Anatomy: Other (Comment) (Protrusion at c5/6, prominent CP) Pharyngeal Secretions: Not observed secondary MBS    Reason for Referral Objectively evaluate swallowing function   Oral Phase Oral Preparation/Oral Phase Oral Phase: WFL   Pharyngeal Phase Pharyngeal Phase Pharyngeal Phase: Impaired Pharyngeal - Honey Pharyngeal - Honey Teaspoon: Reduced epiglottic inversion;Reduced  anterior laryngeal mobility;Reduced laryngeal  elevation;Pharyngeal residue - valleculae;Pharyngeal residue - cp  segment Penetration/Aspiration details (honey teaspoon): Material does  not enter airway Pharyngeal - Nectar Pharyngeal - Nectar Teaspoon: Reduced epiglottic  inversion;Reduced anterior laryngeal mobility;Reduced laryngeal  elevation;Pharyngeal residue - valleculae;Pharyngeal residue - cp  segment Penetration/Aspiration details (nectar teaspoon): Material does  not enter airway Pharyngeal - Nectar Cup: Reduced epiglottic inversion;Reduced  anterior laryngeal mobility;Reduced laryngeal  elevation;Pharyngeal residue - valleculae;Pharyngeal residue - cp  segment;Penetration/Aspiration after swallow Penetration/Aspiration details (nectar cup): Material enters  airway, remains ABOVE vocal cords and not ejected out;Material  does not enter airway Pharyngeal - Nectar Straw: Reduced epiglottic inversion;Reduced  anterior laryngeal mobility;Reduced laryngeal  elevation;Pharyngeal residue - valleculae;Pharyngeal residue - cp  segment;Penetration/Aspiration after swallow Penetration/Aspiration details (nectar straw): Material enters  airway, remains ABOVE vocal  cords and not ejected out;Material  does not enter airway Pharyngeal - Thin Pharyngeal -  Thin Teaspoon: Not tested Pharyngeal - Thin Cup: Reduced epiglottic inversion;Reduced  anterior laryngeal mobility;Reduced laryngeal  elevation;Pharyngeal residue - valleculae;Pharyngeal residue - cp  segment;Penetration/Aspiration after swallow;Trace aspiration Penetration/Aspiration details (thin cup): Material enters  airway, passes BELOW cords without attempt by patient to eject  out (silent aspiration);Material enters airway, CONTACTS cords  and not ejected out Pharyngeal - Thin Straw: Not tested Pharyngeal - Solids Pharyngeal - Puree: Reduced epiglottic inversion;Reduced anterior  laryngeal mobility;Reduced laryngeal elevation;Pharyngeal residue  - valleculae;Pharyngeal residue - cp segment Penetration/Aspiration details (puree): Material does not enter  airway Pharyngeal - Regular: Reduced epiglottic inversion;Reduced  anterior laryngeal mobility;Reduced laryngeal  elevation;Pharyngeal residue - valleculae;Pharyngeal residue - cp  segment Pharyngeal - Pill: Not tested  Cervical Esophageal Phase    GO    Cervical Esophageal Phase Cervical Esophageal Phase: Impaired Cervical Esophageal Phase - Comment Cervical Esophageal Comment: see impression statement        Herbie Baltimore, MA CCC-SLP [phone]  Katherene Ponto Deblois 01/26/2014, 12:48 PM    Dg Swallowing Func-speech Pathology  01/21/2014   Katherene Ponto Deblois, CCC-SLP     01/21/2014 10:58 AM Objective Swallowing Evaluation: Modified Barium Swallowing Study   Patient Details  Name: AQUILLA VOILES MRN: [id number] Date of Birth: 1941-03-12  Today's Date: 01/21/2014 Time: 0930-1000 SLP Time Calculation (min): 30 min  Past Medical History:  Past Medical History  Diagnosis Date  . Diabetes mellitus without complication   . Hypertension   . Hypercholesteremia    Past Surgical History:  Past Surgical History  Procedure Laterality Date  . Prostatectomy    . Colon resection     HPI:   73 year old male with HTN and DM presented 5/27 to ED c/o N/V  since 5/25. In ED found to be in acute renal failure,  hyperkalemic, hypotensive with SBP's in 70s. Started on BiPAP for  hypoxia and increased WOB. Intubated from 5/28 to 6/1. Pt with  ICU delirium. Admission Chest CXR says New confluent bibasilar  pulmonary opacity, nonspecific but consider bilateral pneumonia  or aspiration.      Assessment / Plan / Recommendation Clinical Impression  Dysphagia Diagnosis: Moderate cervical esophageal phase  dysphagia;Severe pharyngeal phase dysphagia;Mild oral phase  dysphagia Clinical impression: Pt demonstrates a primary structural  cervical esophageal dysphagia, present at baseline, that is  likely worsed by pts mental status and generalized weakness. Pt  was awake, but required max cues to keep head up, bring cup to  lips and orally accept POs. Oral phase characterized by slow  transit. Pt noted to have the appearance of bony protrusion at  C5/6 and a prominent cricopharyngeus (no radiologist present to  confirm) as well as reduced laryngeal elevation and hyoid  excursion. These deficits result in incomplete airway closure  with silent aspiration during the swallow and residuals over the  CP segment and in the valleculae that are repentrated after the  swallow even if pt cued to clear his throat. Findings consistent  with possible admission with aspiration pna. Pt would be at high  risk of aspiration at this time and would benefit from several  days of recovery prior to retesting and attempting POs. Improved  mental status and general strength would be indicators for  retest. Recommend NPO with short term alternate nutrition. SLP  will revisit pt on Monday for possiblity of retest.     Treatment Recommendation  Therapy as outlined in treatment plan below    Diet Recommendation NPO;Alternative means - temporary   Medication Administration:  Via alternative means    Other  Recommendations Oral Care Recommendations:  Oral care Q4  per protocol   Follow Up Recommendations  Skilled Nursing facility    Frequency and Duration min 3x week  2 weeks   Pertinent Vitals/Pain NA    SLP Swallow Goals     General HPI: 73 year old male with HTN and DM presented 5/27 to  ED c/o N/V since 5/25. In ED found to be in acute renal failure,  hyperkalemic, hypotensive with SBP's in 70s. Started on BiPAP for  hypoxia and increased WOB. Intubated from 5/28 to 6/1. Pt with  ICU delirium. Admission Chest CXR says New confluent bibasilar  pulmonary opacity, nonspecific but consider bilateral pneumonia  or aspiration.  Type of Study: Modified Barium Swallowing Study Reason for Referral: Objectively evaluate swallowing function Previous Swallow Assessment: none in chart Diet Prior to this Study: NPO Temperature Spikes Noted: No Respiratory Status: Nasal cannula History of Recent Intubation: Yes Length of Intubations (days): 5 days Date extubated: 01/18/14 Behavior/Cognition: Alert;Cooperative;Requires  cueing;Distractible Oral Cavity - Dentition: Dentures, top;Dentures, bottom Oral Motor / Sensory Function: Impaired - see Bedside swallow  eval Self-Feeding Abilities: Able to feed self;Needs assist Patient Positioning: Upright in chair Baseline Vocal Quality: Low vocal intensity;Wet Volitional Cough: Strong Volitional Swallow: Able to elicit Anatomy:  (bony protrustion at C5/6, Prominent CP, no radiologist  pres.) Pharyngeal Secretions:  (suspect standing secretions)    Reason for Referral Objectively evaluate swallowing function   Oral Phase Oral Preparation/Oral Phase Oral Phase: Impaired Oral - Honey Oral - Honey Teaspoon: Delayed oral transit Oral - Nectar Oral - Nectar Teaspoon: Delayed oral transit Oral - Nectar Cup: Delayed oral transit Oral - Thin Oral - Thin Teaspoon: Right anterior bolus loss;Left anterior  bolus loss;Delayed oral transit Oral - Thin Straw: Delayed oral transit (pt struggles to tip head  and cup back for thin cup) Oral - Solids  Oral - Puree: Within functional limits   Pharyngeal Phase Pharyngeal Phase Pharyngeal Phase: Impaired Pharyngeal - Honey Pharyngeal - Honey Teaspoon: Reduced anterior laryngeal  mobility;Reduced epiglottic inversion;Reduced laryngeal  elevation;Reduced airway/laryngeal closure;Reduced tongue base  retraction;Penetration/Aspiration after  swallow;Penetration/Aspiration during swallow;Trace  aspiration;Pharyngeal residue - valleculae;Pharyngeal residue -  cp segment;Pharyngeal residue - pyriform sinuses;Compensatory  strategies attempted (Comment) (head turn left - not effective) Penetration/Aspiration details (honey teaspoon): Material enters  airway, CONTACTS cords and not ejected out;Material enters  airway, passes BELOW cords without attempt by patient to eject  out (silent aspiration) Pharyngeal - Nectar Pharyngeal - Nectar Teaspoon: Reduced anterior laryngeal  mobility;Reduced epiglottic inversion;Reduced laryngeal  elevation;Reduced airway/laryngeal closure;Reduced tongue base  retraction;Penetration/Aspiration after  swallow;Penetration/Aspiration during swallow;Trace  aspiration;Pharyngeal residue - valleculae;Pharyngeal residue -  cp segment;Pharyngeal residue - pyriform sinuses;Compensatory  strategies attempted (Comment) Penetration/Aspiration details (nectar teaspoon): Material enters  airway, CONTACTS cords and not ejected out;Material enters  airway, passes BELOW cords without attempt by patient to eject  out (silent aspiration) Pharyngeal - Nectar Cup: Reduced anterior laryngeal  mobility;Reduced epiglottic inversion;Reduced laryngeal  elevation;Reduced airway/laryngeal closure;Reduced tongue base  retraction;Penetration/Aspiration after  swallow;Penetration/Aspiration during swallow;Trace  aspiration;Pharyngeal residue - valleculae;Pharyngeal residue -  cp segment;Pharyngeal residue - pyriform sinuses;Compensatory  strategies attempted (Comment) Penetration/Aspiration details (nectar cup): Material  enters  airway, CONTACTS cords and not ejected out;Material enters  airway, passes BELOW cords without attempt by patient to eject  out (silent aspiration) Pharyngeal - Thin Pharyngeal - Thin Teaspoon: Not tested (all spilled from mouth) Pharyngeal - Thin Straw: Reduced anterior laryngeal  mobility;Reduced epiglottic  inversion;Reduced laryngeal  elevation;Reduced airway/laryngeal closure;Reduced tongue base  retraction;Penetration/Aspiration during swallow;Pharyngeal  residue - valleculae;Pharyngeal residue - cp segment;Pharyngeal  residue - pyriform sinuses;Compensatory strategies attempted  (Comment);Moderate aspiration Penetration/Aspiration details (thin straw): Material enters  airway, passes BELOW cords without attempt by patient to eject  out (silent aspiration) Pharyngeal - Solids Pharyngeal - Puree: Reduced anterior laryngeal mobility;Reduced  epiglottic inversion;Reduced laryngeal elevation;Reduced  airway/laryngeal closure;Reduced tongue base  retraction;Pharyngeal residue - valleculae;Pharyngeal residue -  cp segment;Pharyngeal residue - pyriform sinuses;Compensatory  strategies attempted (Comment) Penetration/Aspiration details (puree): Material does not enter  airway  Cervical Esophageal Phase    GO    Cervical Esophageal Phase Cervical Esophageal Phase: Impaired Cervical Esophageal Phase - Comment Cervical Esophageal Comment: see impression statement        Herbie Baltimore, MA CCC-SLP [phone]  Katherene Ponto Deblois 01/21/2014, 10:56 AM     Scheduled Meds: . antiseptic oral rinse  15 mL Mouth Rinse BID  . aspirin  81 mg Oral Daily  . carvedilol  6.25 mg Oral BID WC  . darbepoetin (ARANESP) injection - NON-DIALYSIS  100 mcg Subcutaneous Q Wed-1800  . insulin aspart  0-24 Units Subcutaneous TID WC  . pantoprazole  40 mg Oral QAC breakfast   Continuous Infusions:   Active Problems:   Severe sepsis   Acute renal failure   Metabolic acidosis   Hyperkalemia   Thrombocytopenia   Anemia    Aspiration pneumonia   Diabetes mellitus   Dysphagia   HTN (hypertension)   Other and unspecified hyperlipidemia   Overweight(278.02)   Aortic stenosis, moderate   Chronic diastolic heart failure   NSTEMI (non-ST elevated myocardial infarction)   Toe ulcer   Atrial flutter    Time spent: 40 minutes   [facility] Pager [phone]. If 8PM-8AM, please contact night-coverage at [email or web address], password [facility] 02/10/2014, 4:48 PM  LOS: 28 days

## 2014-02-10 NOTE — Progress Notes (Signed)
Background: 73 year old male with  hypertension, diabetes who presented to the ED on 5/27 with N/V. Found to be in severe acute renal failure with a creatinine of 9.36 (was 1.0 in May of 2015) , potassium greater than 7.7, hypotensive, hypoxic, likely from aspiration pneumonia from vomiting with volume overload and started on BiPAP. Admitted to the critical care service, started on pressor support, CVVHD and intubated. CRRT stopped 6/2 and transitioned to IHD.  He is currently making urine. Right TDC replaced  6/14 by interventional radiology.  Assessment /Plans 1. AKI due to ATN related to NSAID, ACEi, hypovolemia, PNA, Septic shock, CRRT 5/28-6/2, now getting prn HD. Found out  that creatinine was 1.05 in May of this year so that makes me much more optimistic that he will recover.  Last HD was 6/18. Vol status good, making urine. There are no indications for HD today, creatinine down.   Will go ahead and remove PC 2. Anemia - s/p PRBC's with HD 6/14; tsat low but ferritin of 2000 precludes use of Fe with HD. Added darbe 100/week [place]  (6/15).  Should improve as renal function improves 3. HTN - meds. Increased carvedilol to 6.25 BID- seems better today 4. DM - SSI 5. S/p PNA (aspiration- completed ATB's) 6. Diastolic CHF - stable and not in CHF now. 7. Atrial flutter - occurred on dialysis 6/16; has not had this issue prior. Responded to IV lopressor.  8. S/p NTEMI - demand ischemia in setting of septic shock on adm  I guess once PC removed could be discharged to SNF- if still here tomorrow will order labs.  If happens to go to SNF later today pt would just need a renal panel on Friday 6/26 and Monday 6/29 fax results to [facility] [phone]   Subjective: Feels better today Denies pain or SOB   Objective: Vital signs in last 24 hours: Temp:  [98.4 F (36.9 C)-99.4 F (37.4 C)] 98.4 F (36.9 C) (06/24 0900) Pulse Rate:  [77-91] 88 (06/24 0900) Resp:  [18-20] 20 (06/24 0900) BP: (113-150)/(58-83)  117/58 mmHg (06/24 1140) SpO2:  [97 %-98 %] 98 % (06/24 0900) Weight:  [78.926 kg (174 lb)] 78.926 kg (174 lb) (06/23 2106) Weight change: 0 kg (0 lb)  Intake/Output from previous day: 06/23 0701 - 06/24 0700 In: 480 [P.O.:480] Out: 1150 [Urine:1150] Intake/Output this shift: Total I/O In: 120 [P.O.:120] Out: 250 [Urine:250] BP 117/58  Pulse 88  Temp(Src) 98.4 F (36.9 C) (Oral)  Resp 20  Ht 5\' 11"  (1.803 m)  Wt 78.926 kg (174 lb)  BMI 24.28 kg/m2  SpO2 98%  Physical examination VS as noted NAD Regular S1S2 No S3 Lungs grossly clear TDC dressing intact right chest Abd soft and nontender No edema whatsoever of the LE's   Recent Labs  02/09/14 0455 02/10/14 0527  WBC 10.4 11.2*  HGB 8.6* 8.7*  HCT 28.0* 27.7*  PLT 247 304    Recent Labs  02/09/14 0455 02/10/14 0527  NA 141 141  K 3.9 4.3  CL 101 100  CO2 23 23  GLUCOSE 102* 90  BUN 55* 56*  CREATININE 4.95* 4.51*  CALCIUM 8.2* 8.5  Phos 5.5  Results for [name], [name] (MRN [id number]) as of 02/02/2014 12:43  Ref. Range 02/02/2014 05:07  Iron Latest Range: 42-135 ug/dL 29 (L)  UIBC Latest Range: 125-400 ug/dL 164  TIBC Latest Range: 215-435 ug/dL 193 (L)  Saturation Ratios Latest Range: 20-55 % 15 (L)  Ferritin Latest Range: 22-322 ng/mL 2065 (  H)   Scheduled Medications: . antiseptic oral rinse  15 mL Mouth Rinse BID  . aspirin  81 mg Oral Daily  . carvedilol  6.25 mg Oral BID WC  . darbepoetin (ARANESP) injection - NON-DIALYSIS  100 mcg Subcutaneous Q Wed-1800  . insulin aspart  0-24 Units Subcutaneous TID WC  . pantoprazole  40 mg Oral QAC breakfast    LOS: 28 days   GOLDSBOROUGH,KELLIE A 02/10/2014,12:15 PM

## 2014-02-10 NOTE — Progress Notes (Signed)
Physical Therapy Treatment Patient Details Name: [name] MRN: [id number] DOB: 27-Apr-1941 Today's Date: 02/10/2014    History of Present Illness 73 year old male with HTN and DM presented 5/27 to ED c/o N/V since 5/25. In ED found to be in acute renal failure, hyperkalemic, hypotensive with SBP's in 70s. Started on BiPAP for hypoxia and increased WOB. intubated 5/28 and extubated 6/1 Pt currently with Hemodialysis Catheter Left side of neck    PT Comments    Increasing activity tolerance, able to double the distance walked yesterday  Follow Up Recommendations  SNF;Supervision/Assistance - 24 hour     Equipment Recommendations  Rolling walker with 5" wheels;3in1 (PT)    Recommendations for Other Services       Precautions / Restrictions Precautions Precautions: Fall    Mobility  Bed Mobility               General bed mobility comments: Pt up in recliner upon arrival  Transfers Overall transfer level: Needs assistance Equipment used: Rolling walker (2 wheeled) Transfers: Sit to/from Stand Sit to Stand: Min assist         General transfer comment: Cues for hand placement and safety; Noted more dependent on momentum for rise today than last session  Ambulation/Gait Ambulation/Gait assistance: Min assist Ambulation Distance (Feet): 70 Feet (x2) Assistive device: Rolling walker (2 wheeled)   Gait velocity: slowed   General Gait Details: Continued cues for posture and to self-monitor for activity tolerance   Stairs            Wheelchair Mobility    Modified Rankin (Stroke Patients Only)       Balance             Standing balance-Leahy Scale: Fair                      Cognition Arousal/Alertness: Awake/alert Behavior During Therapy: WFL for tasks assessed/performed Overall Cognitive Status: Within Functional Limits for tasks assessed                      Exercises      General Comments        Pertinent  Vitals/Pain no apparent distress     Home Living                      Prior Function            PT Goals (current goals can now be found in the care plan section) Acute Rehab PT Goals PT Goal Formulation: With patient Time For Goal Achievement: 02/18/14 Potential to Achieve Goals: Good Progress towards PT goals: Progressing toward goals    Frequency  Min 3X/week    PT Plan Current plan remains appropriate    Co-evaluation             End of Session Equipment Utilized During Treatment: Gait belt Activity Tolerance: Patient tolerated treatment well Patient left: in chair;with call bell/phone within reach     Time: 1132-1146 PT Time Calculation (min): 14 min  Charges:  $Gait Training: 8-22 mins                    G Codes:      Quin Hoop 02/10/2014, 1:21 PM Roney [place], [place] Pager [phone] Office [phone]

## 2014-02-10 NOTE — Progress Notes (Signed)
NUTRITION FOLLOW UP  Intervention:   Continue Magic Cup supplements on meal trays. RD to continue to follow nutrition care plan.  Nutrition Dx:   Inadequate oral intake now related to swallowing difficulty as evidenced by limited intake. Improving.  Goal:   Intake to meet >90% of estimated nutrition needs. Improving.  Monitor:   weight trends, lab trends, I/O's, PO intake, supplement tolerance  Assessment:   73 year old male with HTN and DM presented 5/27 to ED c/o N/V since 5/25. In ED found to be in acute renal failure, hyperkalemic, hypotensive with SBP's in 70s. Started on BiPAP for hypoxia and increased WOB. Required intubation on 5/28.  Patient was extubated on 6/1. CRRT off since 6/2. S/P multiple swallow evaluations with SLP; S/P MBS 6/4 - pt was found to not be ready for diet advancement at this time. Nepro enteral nutrition initiated via NGT on 6/4. Underwent additional MBSS on 6/9, and pt with Dysphagia 3 with Nectar Thickened Liquids. Upgraded to thin liquids on 6/16. Confirmed with RN, patient and nurse tech that patient is eating much better.  Renal continues to follow patient and assess need for HD daily. Per most recent renal note, last HD was 6/18 and volume status is good.   CBG's: 99 - 123 Sodium and potassium WNL Phosphorus elevated at 5.1  Height: Ht Readings from Last 1 Encounters:  02/05/14 5\' 11"  (1.803 m)    Weight Status: Wt Readings from Last 1 Encounters:  02/09/14 174 lb (78.926 kg)  75.2 kg s/p HD 6/18 78.7 kg s/p HD 6/8 79.5 kg s/p HD 6/6  Admission weight: 87.1 kg  Body mass index is 24.28 kg/(m^2). WNL  Re-estimated needs:  Kcal: 2000-2300 Protein: 95-110 gm Fluid: 1.2 L  Skin: stage I to R sacrum  Diet Order: Dysphagia 3; thin liquids   Intake/Output Summary (Last 24 hours) at 02/10/14 1019 Last data filed at 02/10/14 0539  Gross per 24 hour  Intake    480 ml  Output    550 ml  Net    -70 ml    Last BM:  6/23   Labs:   Recent Labs Lab 02/06/14 1024 02/07/14 0505 02/08/14 0350 02/09/14 0455 02/10/14 0527  NA 139 139 139 141 141  K 3.6* 3.4* 3.8 3.9 4.3  CL 97 97 98 101 100  CO2 24 24 24 23 23   BUN 39* 48* 53* 55* 56*  CREATININE 4.53* 5.22* 5.20* 4.95* 4.51*  CALCIUM 8.0* 8.2* 8.3* 8.2* 8.5  PHOS 4.8* 5.5* 5.1*  --   --   GLUCOSE 177* 109* 108* 102* 90    CBG (last 3)   Recent Labs  02/09/14 1659 02/09/14 2105 02/10/14 0758  GLUCAP 123* 118* 99    Scheduled Meds: . antiseptic oral rinse  15 mL Mouth Rinse BID  . aspirin  81 mg Oral Daily  . carvedilol  6.25 mg Oral BID WC  . darbepoetin (ARANESP) injection - NON-DIALYSIS  100 mcg Subcutaneous Q Tue-HD  . insulin aspart  0-24 Units Subcutaneous TID WC  . pantoprazole  40 mg Oral QAC breakfast    Continuous Infusions:    Inda Coke MS, RD, LDN Inpatient Registered Dietitian Pager: [phone] After-hours pager: [phone]

## 2014-02-11 ENCOUNTER — Inpatient Hospital Stay (HOSPITAL_COMMUNITY): Payer: Medicare Other

## 2014-02-11 ENCOUNTER — Encounter (HOSPITAL_BASED_OUTPATIENT_CLINIC_OR_DEPARTMENT_OTHER): Admission: RE | Payer: Self-pay | Source: Ambulatory Visit

## 2014-02-11 ENCOUNTER — Ambulatory Visit (HOSPITAL_BASED_OUTPATIENT_CLINIC_OR_DEPARTMENT_OTHER): Admission: RE | Admit: 2014-02-11 | Payer: Medicare Other | Source: Ambulatory Visit | Admitting: Orthopedic Surgery

## 2014-02-11 LAB — CBC
HCT: 26.6 % — ABNORMAL LOW (ref 39.0–52.0)
Hemoglobin: 8.4 g/dL — ABNORMAL LOW (ref 13.0–17.0)
MCH: 28.4 pg (ref 26.0–34.0)
MCHC: 31.6 g/dL (ref 30.0–36.0)
MCV: 89.9 fL (ref 78.0–100.0)
PLATELETS: 292 10*3/uL (ref 150–400)
RBC: 2.96 MIL/uL — AB (ref 4.22–5.81)
RDW: 14.9 % (ref 11.5–15.5)
WBC: 10.7 10*3/uL — AB (ref 4.0–10.5)

## 2014-02-11 LAB — BASIC METABOLIC PANEL
BUN: 50 mg/dL — ABNORMAL HIGH (ref 6–23)
CALCIUM: 8.7 mg/dL (ref 8.4–10.5)
CHLORIDE: 100 meq/L (ref 96–112)
CO2: 22 mEq/L (ref 19–32)
CREATININE: 3.92 mg/dL — AB (ref 0.50–1.35)
GFR calc non Af Amer: 14 mL/min — ABNORMAL LOW (ref >90)
GFR, EST AFRICAN AMERICAN: 16 mL/min — AB (ref >90)
Glucose, Bld: 92 mg/dL (ref 70–99)
Potassium: 3.8 mEq/L (ref 3.7–5.3)
Sodium: 139 mEq/L (ref 137–147)

## 2014-02-11 LAB — GLUCOSE, CAPILLARY
GLUCOSE-CAPILLARY: 131 mg/dL — AB (ref 70–99)
Glucose-Capillary: 111 mg/dL — ABNORMAL HIGH (ref 70–99)

## 2014-02-11 SURGERY — AMPUTATION, FOOT, RAY
Anesthesia: Regional | Site: Toe | Laterality: Left

## 2014-02-11 MED ORDER — RESOURCE THICKENUP CLEAR PO POWD: ORAL | Status: DC

## 2014-02-11 MED ORDER — PANTOPRAZOLE SODIUM 40 MG PO TBEC: 40.0000 mg | DELAYED_RELEASE_TABLET | Freq: Every day | ORAL | Status: DC

## 2014-02-11 MED ORDER — CHLORHEXIDINE GLUCONATE 4 % EX LIQD
CUTANEOUS | Status: DC
Start: 2014-02-11 — End: 2014-02-11
  Filled 2014-02-11: qty 15

## 2014-02-11 MED ORDER — ASPIRIN 81 MG PO CHEW: 81.0000 mg | CHEWABLE_TABLET | Freq: Every day | ORAL | Status: DC

## 2014-02-11 MED ORDER — VERAPAMIL HCL ER 240 MG PO TBCR: 240.0000 mg | EXTENDED_RELEASE_TABLET | Freq: Every day | ORAL | Status: DC

## 2014-02-11 MED ORDER — CARVEDILOL 6.25 MG PO TABS: 6.2500 mg | ORAL_TABLET | Freq: Two times a day (BID) | ORAL | Status: DC

## 2014-02-11 NOTE — Progress Notes (Signed)
Background: 73 year old male with  hypertension, diabetes who presented to the ED on 5/27 with N/V. Found to be in severe acute renal failure with a creatinine of 9.36 (was 1.0 in May of 2015) , potassium greater than 7.7, hypotensive, hypoxic, likely from aspiration pneumonia from vomiting with volume overload and started on BiPAP. Admitted to the critical care service, started on pressor support, CVVHD and intubated. CRRT stopped 6/2 and transitioned to IHD.  He is currently making urine and seems to be recovering renal function  Assessment /Plans 1. AKI due to ATN related to NSAID, ACEi, hypovolemia, PNA, Septic shock, CRRT 5/28-6/2, now getting prn HD. Found out  that creatinine was 1.05 in May of this year so that makes me much more optimistic that he will recover.  Last HD was 6/18. Vol status good, making urine. There are no indications for HD today, creatinine down.   PC removed- being discharged to facility ([facility]) today 2. Anemia - s/p PRBC's with HD 6/14; tsat low but ferritin of 2000 precludes use of Fe with HD. Added darbe 100/week [place]  (6/15).  Should improve as renal function improves- will not need ESA at time of discharge 3. HTN - meds. Increased carvedilol to 6.25 BID- seems better today 4. DM - SSI 5. S/p PNA (aspiration- completed ATB's) 6. Diastolic CHF - stable and not in CHF now. 7. Atrial flutter - occurred on dialysis 6/16; has not had this issue prior. Responded to IV lopressor.  8. S/p NTEMI - demand ischemia in setting of septic shock on adm  I am OK with discharge to facility.  Have orders to draw labs for kidney function on Friday and Monday.  I can order further labs from there.  If AKI completely resolves will not need nephrology follow up.  If it stalls I will arrange follow up   Subjective: Feels better today Denies pain or SOB   Objective: Vital signs in last 24 hours: Temp:  [98.1 F (36.7 C)-99.3 F (37.4 C)] 98.5 F (36.9 C) (06/25 0940) Pulse Rate:   [75-88] 75 (06/25 0940) Resp:  [18-20] 18 (06/25 0940) BP: (120-154)/(63-70) 154/68 mmHg (06/25 0940) SpO2:  [97 %-98 %] 97 % (06/25 0940) Weight:  [78.926 kg (174 lb)] 78.926 kg (174 lb) (06/24 2143) Weight change: 0 kg (0 lb)  Intake/Output from previous day: 06/24 0701 - 06/25 0700 In: 360 [P.O.:360] Out: 900 [Urine:900] Intake/Output this shift: Total I/O In: 240 [P.O.:240] Out: 300 [Urine:300] BP 154/68  Pulse 75  Temp(Src) 98.5 F (36.9 C) (Oral)  Resp 18  Ht 5\' 11"  (1.803 m)  Wt 78.926 kg (174 lb)  BMI 24.28 kg/m2  SpO2 97%  Physical examination VS as noted NAD Regular S1S2 No S3 Lungs grossly clear TDC dressing intact right chest Abd soft and nontender No edema whatsoever of the LE's   Recent Labs  02/10/14 0527 02/11/14 0433  WBC 11.2* 10.7*  HGB 8.7* 8.4*  HCT 27.7* 26.6*  PLT 304 292    Recent Labs  02/10/14 0527 02/11/14 0433  NA 141 139  K 4.3 3.8  CL 100 100  CO2 23 22  GLUCOSE 90 92  BUN 56* 50*  CREATININE 4.51* 3.92*  CALCIUM 8.5 8.7  Phos 5.5  Results for TARIG, ZIMMERS (MRN [id number]) as of 02/02/2014 12:43  Ref. Range 02/02/2014 05:07  Iron Latest Range: 42-135 ug/dL 29 (L)  UIBC Latest Range: 125-400 ug/dL 164  TIBC Latest Range: 215-435 ug/dL 193 (L)  Saturation Ratios  Latest Range: 20-55 % 15 (L)  Ferritin Latest Range: 22-322 ng/mL 2065 (H)   Scheduled Medications: . antiseptic oral rinse  15 mL Mouth Rinse BID  . aspirin  81 mg Oral Daily  . carvedilol  6.25 mg Oral BID WC  . chlorhexidine      . darbepoetin (ARANESP) injection - NON-DIALYSIS  100 mcg Subcutaneous Q Wed-1800  . insulin aspart  0-24 Units Subcutaneous TID WC  . pantoprazole  40 mg Oral QAC breakfast    LOS: 29 days   GOLDSBOROUGH,KELLIE A 02/11/2014,11:44 AM

## 2014-02-11 NOTE — Discharge Summary (Signed)
Physician Discharge Summary  MARTEL GALVAN MRN: [id number] DOB/AGE: 26-Jan-1941 73 y.o.  PCP: No primary provider on file.   Admit date: 01/13/2014 Discharge date: 02/11/2014  Discharge Diagnoses:      Severe sepsis   Acute renal failure   Metabolic acidosis   Hyperkalemia   Thrombocytopenia   Anemia   Aspiration pneumonia   Diabetes mellitus   Dysphagia   HTN (hypertension)   Other and unspecified hyperlipidemia   Overweight(278.02)   Aortic stenosis, moderate   Chronic diastolic heart failure   NSTEMI (non-ST elevated myocardial infarction)   Toe ulcer   Atrial flutter  Followup recommendations need a renal panel on Friday 6/26 and Monday 6/29 fax results to [facility]    dysphagia 3, thin liquids    Medication List    STOP taking these medications       lisinopril 20 MG tablet  Commonly known as:  PRINIVIL,ZESTRIL     metFORMIN 1000 MG tablet  Commonly known as:  GLUCOPHAGE     ranitidine 150 MG tablet  Commonly known as:  ZANTAC      TAKE these medications       alendronate 70 MG tablet  Commonly known as:  FOSAMAX  Take 1 tablet by mouth once a week. Patient takes on Sunday     allopurinol 300 MG tablet  Commonly known as:  ZYLOPRIM  Take 300 mg by mouth daily.     aspirin 81 MG chewable tablet  Chew 1 tablet (81 mg total) by mouth daily.     carvedilol 6.25 MG tablet  Commonly known as:  COREG  Take 1 tablet (6.25 mg total) by mouth 2 (two) times daily with a meal.     lovastatin 20 MG tablet  Commonly known as:  MEVACOR  Take 20 mg by mouth daily.     oxybutynin 5 MG tablet  Commonly known as:  DITROPAN  Take 5 mg by mouth 4 (four) times daily.     pantoprazole 40 MG tablet  Commonly known as:  PROTONIX  Take 1 tablet (40 mg total) by mouth daily before breakfast.     RESOURCE THICKENUP CLEAR Powd  With 3 meals     verapamil 240 MG CR tablet  Commonly known as:  CALAN-SR  Take 1 tablet (240 mg total) by mouth daily.         Discharge Condition: Stable Disposition:    Consultants:  nephrology Critical Care  Interventional radiology  Speech therapy  Procedures:  Status post intubation on 5/28-extubated 6/1  Temporary dialysis catheter placed 5/28, patient removed it on 6/11  CVVH started 5/20  Echo 5/28 >> EF 65 to 87%, grade 2 diastolic dysfx, mod AS  Placement of dialysis catheter by interventional radiology on 6/14  Antibiotics:  Zosyn 5/27 >>> 6/03  Vancomycin 5/27 >>> 6/02      Significant Diagnostic Studies: Dg Abd 1 View  01/23/2014   CLINICAL DATA:  Feeding tube placement.  EXAM: ABDOMEN - 1 VIEW  COMPARISON:  01/21/2014  FINDINGS: Feeding tube tip is seen with tip in the fundus of the stomach. Some residual contrast is seen within the colon. No dilated bowel loops seen.  IMPRESSION: Feeding tube tip in fundus of stomach.   Electronically Signed   By: Earle Gell M.D.   On: 01/23/2014 18:57   Dg Esophagus  01/26/2014   CLINICAL DATA:  Dysphagia. Modified swallow earlier in the morning only clearing patient for nectar thickness.  EXAM: ESOPHOGRAM/BARIUM SWALLOW  TECHNIQUE: Single contrast examination was performed using  thick barium.  FLUOROSCOPY TIME:  3 min and 20 is seconds  COMPARISON:  Chest radiograph 01/19/2014.  FINDINGS: Focused single-contrast exam was performed in a supine, mildly LPO position. The bed was maintained partially upright.  Evaluation of a single swallow demonstrates contrast stasis throughout the mid and upper thoracic esophagus.  Full column evaluation of the esophagus demonstrates no persistent narrowing or stricture.  13 mm barium tablet has initial delay in the vallecula. On subsequent swallows, the tablet passes into the esophagus and then into the stomach.  IMPRESSION: 1. Moderately degraded exam. 2. 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 3. No evidence of  stricture other anatomic cause for dysphagia. 4. Delayed passage of the barium tablet at the  level of the vallecula.   Electronically Signed   By: Abigail Miyamoto M.D.   On: 01/26/2014 16:14   US Renal  01/14/2014   CLINICAL DATA:  Renal failure, history hypertension, diabetes  EXAM: RENAL/URINARY TRACT ULTRASOUND COMPLETE  COMPARISON:  None  FINDINGS: Right Kidney:  Length: 12.4 cm length. Cortical thickness up to 1.8 cm thick. Increased cortical echogenicity. Hypoechoic nodule medial aspect mid RIGHT kidney 2.1 x 2.2 x 2.1 cm containing scattered internal echoes question complicated cyst. No additional mass, hydronephrosis or shadowing calcification.  Left Kidney:  Length: 10.5 cm length. Cortical thickness up to 2.0 cm thick. Increased cortical echogenicity. Minimally complicated cyst at mid LEFT kidney 2.7 x 3.3 x 3.7 cm containing slightly irregular wall and scattered internal echoes. No additional mass, hydronephrosis or shadowing calcification.  Bladder:  Contains only minimal urine. Minimally enlarged central lobe of prostate.  IMPRESSION: Increased renal cortical echogenicity bilaterally consistent with medical renal disease.  Mildly complicated cysts within the mid portions of both kidneys.  No evidence of hydronephrosis.   Electronically Signed   By: Lavonia Dana M.D.   On: 01/14/2014 02:31   Ir Fluoro Guide Cv Line Right  01/31/2014   INDICATION: Renal failure and needs access for hemodialysis.  EXAM: FLUOROSCOPIC AND ULTRASOUND GUIDED PLACEMENT OF A TUNNELED DIALYSIS CATHETER  Physician: Stephan Minister. Anselm Pancoast, MD  FLUOROSCOPY TIME:  24 seconds  MEDICATIONS: 0.5 mg versed, 25 mcg fentanyl. A radiology nurse monitored the patient for moderate sedation.  ANESTHESIA/SEDATION: Moderate sedation time: 19 minutes  PROCEDURE: Informed consent was obtained for placement of a tunneled dialysis catheter. The patient was placed supine on the interventional table. Ultrasound confirmed a patent right internal jugularvein. Ultrasound images were obtained for documentation. The right side of the neck was prepped and  draped in a sterile fashion. The right side of the neck was anesthetized with 1% lidocaine. Maximal barrier sterile technique was utilized including caps, mask, sterile gowns, sterile gloves, sterile drape, hand hygiene and skin antiseptic. A small incision was made with #11 blade scalpel. A 21 gauge needle directed into the right internal jugular vein with ultrasound guidance. A micropuncture dilator set was placed. A 19 cm tip to cuff HemoSplit catheter was selected. The skin below the right clavicle was anesthetized and a small incision was made with an #11 blade scalpel. A subcutaneous tunnel was formed to the vein dermatotomy site. The catheter was brought through the tunnel. The vein dermatotomy site was dilated to accommodate a peel-away sheath. The catheter was placed through the peel-away sheath and directed into the central venous structures. The tip of the catheter was placed in the lower SVC with fluoroscopy. Fluoroscopic images were obtained for  documentation. Both lumens were found to aspirate and flush well. The proper amount of heparin was flushed in both lumens. The vein dermatotomy site was closed using a single layer of absorbable suture and Dermabond. The catheter was secured to the skin using Prolene suture.  FINDINGS: Catheter tip in the lower SVC.  COMPLICATIONS: None  IMPRESSION: Successful placement of a right jugular tunneled dialysis catheter using ultrasound and fluoroscopic guidance.   Electronically Signed   By: Markus Daft M.D.   On: 01/31/2014 13:45   Ir US Guide Vasc Access Right  01/31/2014   INDICATION: Renal failure and needs access for hemodialysis.  EXAM: FLUOROSCOPIC AND ULTRASOUND GUIDED PLACEMENT OF A TUNNELED DIALYSIS CATHETER  Physician: Stephan Minister. Anselm Pancoast, MD  FLUOROSCOPY TIME:  24 seconds  MEDICATIONS: 0.5 mg versed, 25 mcg fentanyl. A radiology nurse monitored the patient for moderate sedation.  ANESTHESIA/SEDATION: Moderate sedation time: 19 minutes  PROCEDURE: Informed  consent was obtained for placement of a tunneled dialysis catheter. The patient was placed supine on the interventional table. Ultrasound confirmed a patent right internal jugularvein. Ultrasound images were obtained for documentation. The right side of the neck was prepped and draped in a sterile fashion. The right side of the neck was anesthetized with 1% lidocaine. Maximal barrier sterile technique was utilized including caps, mask, sterile gowns, sterile gloves, sterile drape, hand hygiene and skin antiseptic. A small incision was made with #11 blade scalpel. A 21 gauge needle directed into the right internal jugular vein with ultrasound guidance. A micropuncture dilator set was placed. A 19 cm tip to cuff HemoSplit catheter was selected. The skin below the right clavicle was anesthetized and a small incision was made with an #11 blade scalpel. A subcutaneous tunnel was formed to the vein dermatotomy site. The catheter was brought through the tunnel. The vein dermatotomy site was dilated to accommodate a peel-away sheath. The catheter was placed through the peel-away sheath and directed into the central venous structures. The tip of the catheter was placed in the lower SVC with fluoroscopy. Fluoroscopic images were obtained for documentation. Both lumens were found to aspirate and flush well. The proper amount of heparin was flushed in both lumens. The vein dermatotomy site was closed using a single layer of absorbable suture and Dermabond. The catheter was secured to the skin using Prolene suture.  FINDINGS: Catheter tip in the lower SVC.  COMPLICATIONS: None  IMPRESSION: Successful placement of a right jugular tunneled dialysis catheter using ultrasound and fluoroscopic guidance.   Electronically Signed   By: Markus Daft M.D.   On: 01/31/2014 13:45   Dg Chest Port 1 View  01/19/2014   CLINICAL DATA:  Cough  EXAM: PORTABLE CHEST - 1 VIEW  COMPARISON:  01/18/2014  FINDINGS: Cardiac shadow is stable. A right  jugular line and left jugular temporary dialysis catheter are again seen and stable. The endotracheal tube and nasogastric catheter have been removed. The lungs are well aerated bilaterally. Improved aeration is again seen bilaterally with persistent changes in the right lung base. No new focal infiltrate is seen.  IMPRESSION: Improved aeration with some persistent changes in the right base.  Tubes and lines as described.   Electronically Signed   By: Inez Catalina M.D.   On: 01/19/2014 06:53   Dg Chest Port 1 View  01/18/2014   CLINICAL DATA:  Check endotracheal tube position.  EXAM: PORTABLE CHEST - 1 VIEW  COMPARISON:  01/16/2014.  FINDINGS: Endotracheal tube terminates approximately 3.1 cm above the  carina. Nasogastric tube is followed into the stomach. Right IJ central line tip projects over the low SVC. Left IJ catheter tip projects over the brachiocephalic vein confluence or upper SVC. Heart is enlarged, stable. Thoracic aorta is calcified. There is mild-to-moderate diffuse bilateral airspace disease with slight improvement in aeration at the right lung base. No definite pleural fluid.  IMPRESSION: Mild to moderate diffuse bilateral airspace disease, likely due to pulmonary edema. Slight improvement in aeration at the right lung base.   Electronically Signed   By: Lorin Picket M.D.   On: 01/18/2014 07:40   Dg Chest Port 1 View  01/16/2014   CLINICAL DATA:  Aspiration pneumonia  EXAM: PORTABLE CHEST - 1 VIEW  COMPARISON:  01/15/2014  FINDINGS: Endotracheal tube ends in the mid thoracic trachea. Unchanged positioning of bilateral IJ catheters, tips at the level of the SVC. Enteric tube enters the stomach.  Bibasilar airspace disease shows no progression or clearing. No evidence of increasing pleural fluid or air leak.  IMPRESSION: 1. Stable positioning of tubes and lines. 2. Unchanged bibasilar pneumonia.   Electronically Signed   By: Jorje Guild M.D.   On: 01/16/2014 07:18   Dg Chest Port 1  View  01/15/2014   CLINICAL DATA:  Ventilator support.  Pneumonia.  EXAM: PORTABLE CHEST - 1 VIEW  COMPARISON:  01/14/2014  FINDINGS: Endotracheal tube has its tip 4 cm above the carina. Nasogastric tube enters the stomach. Right internal jugular central line has its tip in the SVC above the right atrium. Left internal jugular central line tips are in the SVC above the right atrium. Bilateral lower lobe airspace filling consistent with pneumonia persists, probably slightly improved since yesterday. The upper lungs remain clear. No effusions.  IMPRESSION: Lines and tubes well positioned. Slight improvement in bilateral lower lobe pneumonia.   Electronically Signed   By: Nelson Chimes M.D.   On: 01/15/2014 07:35   Dg Chest Port 1 View  01/14/2014   CLINICAL DATA:  Status post central line placement  EXAM: PORTABLE CHEST - 1 VIEW  COMPARISON:  01/14/2014 225 hr  FINDINGS: A left jugular dual-lumen central line is again identified and stable. A new right jugular central line is seen with the catheter tip at the cavoatrial junction. No pneumothorax is noted. The endotracheal tube is seen 5 cm above the carina. A nasogastric catheter is noted extending into the stomach. Bilateral lower lung infiltrates are seen. The overall appearance is stable. No new focal abnormality is noted.  IMPRESSION: Stable bilateral infiltrates.  New tubes and lines as described above without complicating factors.   Electronically Signed   By: Inez Catalina M.D.   On: 01/14/2014 09:33   Dg Chest Port 1 View  01/14/2014   CLINICAL DATA:  Hemodialysis catheter placement  EXAM: Portable exam 0225 hr compared to 01/13/2014  COMPARISON:  None.  FINDINGS: LEFT jugular dual-lumen central venous catheter with tip projecting over mid SVC.  Enlargement of cardiac silhouette with pulmonary vascular congestion.  Perihilar and basilar infiltrates which could represent edema or infection.  No pneumothorax.  Bones unremarkable.  IMPRESSION: No  pneumothorax following LEFT jugular line placement.  Enlargement of cardiac silhouette with pulmonary vascular congestion and diffuse increased pulmonary infiltrates, question edema versus infection.   Electronically Signed   By: Lavonia Dana M.D.   On: 01/14/2014 02:41   Dg Chest Port 1 View  01/13/2014   CLINICAL DATA:  73 year old male with back and knee pain. Shortness of Breath. Initial  encounter.  EXAM: PORTABLE CHEST - 1 VIEW  COMPARISON:  11/21/2007.  FINDINGS: Portable AP supine view at 2211 hrs. New confluent bibasilar pulmonary opacity. No pneumothorax. Pulmonary vascularity within normal limits. Stable cardiac size and mediastinal contours. Visualized tracheal air column is within normal limits.  IMPRESSION: New confluent bibasilar pulmonary opacity, nonspecific but consider bilateral pneumonia or aspiration.   Electronically Signed   By: Lars Pinks M.D.   On: 01/13/2014 23:02   Dg Abd Portable 1v  01/23/2014   CLINICAL DATA:  Feeding tube placement.  EXAM: PORTABLE ABDOMEN - 1 VIEW  COMPARISON:  01/23/2014  FINDINGS: The Dobbhoff feeding tube is looped back on itself in the descending duodenum. The tip is in the antropyloric region of the stomach.  IMPRESSION: Dobbhoff feeding tube is looped back on itself in the descending duodenum.   Electronically Signed   By: Kalman Jewels M.D.   On: 01/23/2014 23:44   Dg Abd Portable 1v  01/21/2014   CLINICAL DATA:  Feeding tube placement.  EXAM: PORTABLE ABDOMEN - 1 VIEW  COMPARISON:  None.  FINDINGS: Weighted tip feeding tube is present in the proximal gastric fundus. Barium is present in small bowel. The bowel gas pattern is nonobstructive. Gaseous distention of the colon.  IMPRESSION: Weighted tip feeding tube in the proximal gastric fundus.   Electronically Signed   By: Dereck Ligas M.D.   On: 01/21/2014 16:32   Dg Swallowing Func-speech Pathology  02/02/2014   Katherene Ponto Deblois, CCC-SLP     02/02/2014 10:53 AM Objective Swallowing  Evaluation: Modified Barium Swallowing Study   Patient Details  Name: JAMARRI VUNCANNON MRN: [id number] Date of Birth: Mar 31, 1941  Today's Date: 02/02/2014 Time: 1000-1030 SLP Time Calculation (min): 30 min  Past Medical History:  Past Medical History  Diagnosis Date  . Diabetes mellitus without complication   . Hypertension   . Hypercholesteremia    Past Surgical History:  Past Surgical History  Procedure Laterality Date  . Prostatectomy    . Colon resection     HPI:  73 year old male with HTN and DM presented 5/27 to ED c/o N/V  since 5/25. In ED found to be in acute renal failure,  hyperkalemic, hypotensive with SBP's in 70s. Started on BiPAP for  hypoxia and increased WOB. Intubated from 5/28 to 6/1. Pt with  ICU delirium. Admission Chest CXR says New confluent bibasilar  pulmonary opacity, nonspecific but consider bilateral pneumonia  or aspiration. Pt has undergone multiple MBS studies with  yesterday's study showing improved swallow function to allow po  intake with strict precautions.       Assessment / Plan / Recommendation Clinical Impression  Dysphagia Diagnosis: Moderate pharyngeal phase  dysphagia;Moderate cervical esophageal phase dysphagia Clinical impression: Pt demonstrates swallow function that is  likely his baseline. Strength has continued to improve with  minimal oropharygneal residuals even with solid textures. 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 Pt  consistently has trace silent penetration to the cords with small  to normal sips of thin liquids. He had aspiration when trying to  swallow too quickly or tilting his head dramatically back. If pt  clears his throat every 2-3 sips he can expel trace penetrates.  Again this function appears to be pts baseline. Recommend a dys 3  (mechanical soft) diet with thin liquids and an intermittent  throat clear with ongoing therapy to address compensatory  strategies.  Treatment Recommendation  Therapy as outlined in treatment plan below    Diet Recommendation Dysphagia 3 (Mechanical Soft);Thin liquid   Liquid Administration via: Cup;No straw Medication Administration: Whole meds with puree Supervision: Patient able to self feed;Full supervision/cueing  for compensatory strategies Compensations: Slow rate;Small sips/bites;Clear throat  intermittently;Follow solids with liquid Postural Changes and/or Swallow Maneuvers: Seated upright 90  degrees;Upright 30-60 min after meal;Out of bed for meals    Other  Recommendations Oral Care Recommendations: Oral care BID   Follow Up Recommendations  Inpatient Rehab    Frequency and Duration min 2x/week  2 weeks   Pertinent Vitals/Pain NA    SLP Swallow Goals     General HPI: 73 year old male with HTN and DM presented 5/27 to  ED c/o N/V since 5/25. In ED found to be in acute renal failure,  hyperkalemic, hypotensive with SBP's in 70s. Started on BiPAP for  hypoxia and increased WOB. Intubated from 5/28 to 6/1. Pt with  ICU delirium. Admission Chest CXR says New confluent bibasilar  pulmonary opacity, nonspecific but consider bilateral pneumonia  or aspiration. Pt has undergone multiple MBS studies with  yesterday's study showing improved swallow function to allow po  intake with strict precautions.   Type of Study: Modified Barium Swallowing Study Reason for Referral: Objectively evaluate swallowing function Diet Prior to this Study: Dysphagia 3 (soft);Nectar-thick liquids Temperature Spikes Noted: No Respiratory Status: Room air History of Recent Intubation: Yes Length of Intubations (days): 5 days Date extubated: 01/18/14 Behavior/Cognition: Alert;Cooperative;Pleasant mood Oral Cavity - Dentition: Dentures, top;Dentures, bottom Oral Motor / Sensory Function: Within functional limits Self-Feeding Abilities: Able to feed self Patient Positioning: Upright in chair Baseline Vocal Quality: Clear Volitional Cough: Strong Volitional Swallow: Able  to elicit Anatomy: Other (Comment) (bony protrustion at C5/6, Prominent CP,  no radiologist pres) Pharyngeal Secretions: Not observed secondary MBS    Reason for Referral Objectively evaluate swallowing function   Oral Phase Oral Preparation/Oral Phase Oral Phase: WFL Oral - Honey Oral - Honey Teaspoon: Not tested Oral - Nectar Oral - Nectar Teaspoon: Not tested Oral - Nectar Cup: Not tested Oral - Thin Oral - Thin Teaspoon: Not tested Oral - Thin Straw: Within functional limits (with a chin tuck) Oral - Solids Oral - Puree: Within functional limits   Pharyngeal Phase Pharyngeal Phase Pharyngeal Phase: Impaired Pharyngeal - Honey Pharyngeal - Honey Teaspoon: Not tested Pharyngeal - Nectar Pharyngeal - Nectar Teaspoon: Not tested Pharyngeal - Nectar Cup: Not tested Pharyngeal - Nectar Straw: Not tested Pharyngeal - Thin Pharyngeal - Thin Teaspoon: Not tested Pharyngeal - Thin Cup: Delayed swallow initiation;Reduced  laryngeal elevation;Reduced airway/laryngeal  closure;Penetration/Aspiration before swallow;Trace aspiration Penetration/Aspiration details (thin cup): Material enters  airway, CONTACTS cords and not ejected out;Material enters  airway, passes BELOW cords without attempt by patient to eject  out (silent aspiration) Pharyngeal - Thin Straw: Delayed swallow initiation;Reduced  laryngeal elevation;Reduced airway/laryngeal  closure;Penetration/Aspiration before swallow;Trace aspiration Penetration/Aspiration details (thin straw): Material enters  airway, CONTACTS cords and not ejected out Pharyngeal - Solids Pharyngeal - Puree: Pharyngeal residue - valleculae;Reduced  laryngeal elevation;Reduced epiglottic inversion Penetration/Aspiration details (puree): Material does not enter  airway Pharyngeal - Regular: Pharyngeal residue - valleculae;Reduced  laryngeal elevation;Reduced epiglottic inversion  Cervical Esophageal Phase    GO    Cervical Esophageal Phase Cervical Esophageal Phase: Impaired Cervical  Esophageal Phase - Comment Cervical Esophageal Comment: see impression statement        Herbie Baltimore, MA CCC-SLP [phone]  DeBlois, Katherene Ponto 02/02/2014,  10:51 AM    Dg Swallowing Func-speech Pathology  01/26/2014   Katherene Ponto Deblois, CCC-SLP     01/26/2014 12:49 PM Objective Swallowing Evaluation: Modified Barium Swallowing Study   Patient Details  Name: MAXON KRESSE MRN: [id number] Date of Birth: 07-Aug-1941  Today's Date: 01/26/2014 Time: 9622-2979 SLP Time Calculation (min): 19 min  Past Medical History:  Past Medical History  Diagnosis Date  . Diabetes mellitus without complication   . Hypertension   . Hypercholesteremia    Past Surgical History:  Past Surgical History  Procedure Laterality Date  . Prostatectomy    . Colon resection     HPI:  73 year old male with HTN and DM presented 5/27 to ED c/o N/V  since 5/25. In ED found to be in acute renal failure,  hyperkalemic, hypotensive with SBP's in 70s. Started on BiPAP for  hypoxia and increased WOB. Intubated from 5/28 to 6/1. Pt with  ICU delirium. Admission Chest CXR says New confluent bibasilar  pulmonary opacity, nonspecific but consider bilateral pneumonia  or aspiration.      Assessment / Plan / Recommendation Clinical Impression  Dysphagia Diagnosis: Mild cervical esophageal phase  dysphagia;Suspected primary esophageal dysphagia;Mild pharyngeal  phase dysphagia Clinical impression: Overall strength and timeliness of swallow  has significantly improved though mild to moderate dyspahgia  persists. Reduced hyoid excursion and base of tongue retraction  leads to mild to moderate vallecular residuals that, with thin  liquids, are silently penetrated/aspirated after the swallow.  Quantity and severity of penetration with nectar thick liquids is  trace and is easily cleared with occasional throat clear. The  primary problem is likely more related to cervical esophageal and  possibly esopahgeal function. There is a prominent CP, reduced  opening of UES  and pt was noted to have significant esophageal  stasis building up to proximal esophagus with one instance of  backflow to pharynx. Pt is recommended to consume a dys 3  (mechanical soft) diet with nectar thick liquids, intermittent  throat clear and esophageal precautions. Discussed concerns with  MD. Kerby Moors diet will reduce risk, but still pt may aspirate  esophageal backflow.     Treatment Recommendation  Therapy as outlined in treatment plan below    Diet Recommendation Dysphagia 3 (Mechanical Soft);Nectar-thick  liquid   Liquid Administration via: Cup;Straw Medication Administration: Crushed with puree Supervision: Patient able to self feed;Full supervision/cueing  for compensatory strategies Compensations: Slow rate;Small sips/bites;Clear throat  intermittently;Follow solids with liquid Postural Changes and/or Swallow Maneuvers: Seated upright 90  degrees;Upright 30-60 min after meal;Out of bed for meals    Other  Recommendations Oral Care Recommendations: Oral care BID Other Recommendations: Order thickener from pharmacy   Follow Up Recommendations  Inpatient Rehab    Frequency and Duration min 2x/week  2 weeks   Pertinent Vitals/Pain NA    SLP Swallow Goals     General HPI: 73 year old male with HTN and DM presented 5/27 to  ED c/o N/V since 5/25. In ED found to be in acute renal failure,  hyperkalemic, hypotensive with SBP's in 70s. Started on BiPAP for  hypoxia and increased WOB. Intubated from 5/28 to 6/1. Pt with  ICU delirium. Admission Chest CXR says New confluent bibasilar  pulmonary opacity, nonspecific but consider bilateral pneumonia  or aspiration.  Type of Study: Modified Barium Swallowing Study Reason for Referral: Objectively evaluate swallowing function Previous Swallow Assessment: MBS 01/21/14 - NPO Diet Prior to this Study: NPO;Panda Temperature Spikes Noted: No Respiratory  Status: Room air History of Recent Intubation: Yes Length of Intubations (days): 5 days Date extubated: 01/18/14  Behavior/Cognition: Alert;Cooperative;Requires  cueing;Distractible Oral Cavity - Dentition: Dentures, top;Dentures, bottom Oral Motor / Sensory Function: Impaired - see Bedside swallow  eval Self-Feeding Abilities: Able to feed self Patient Positioning: Upright in chair Baseline Vocal Quality: Clear Volitional Cough: Strong Volitional Swallow: Able to elicit Anatomy: Other (Comment) (Protrusion at c5/6, prominent CP) Pharyngeal Secretions: Not observed secondary MBS    Reason for Referral Objectively evaluate swallowing function   Oral Phase Oral Preparation/Oral Phase Oral Phase: WFL   Pharyngeal Phase Pharyngeal Phase Pharyngeal Phase: Impaired Pharyngeal - Honey Pharyngeal - Honey Teaspoon: Reduced epiglottic inversion;Reduced  anterior laryngeal mobility;Reduced laryngeal  elevation;Pharyngeal residue - valleculae;Pharyngeal residue - cp  segment Penetration/Aspiration details (honey teaspoon): Material does  not enter airway Pharyngeal - Nectar Pharyngeal - Nectar Teaspoon: Reduced epiglottic  inversion;Reduced anterior laryngeal mobility;Reduced laryngeal  elevation;Pharyngeal residue - valleculae;Pharyngeal residue - cp  segment Penetration/Aspiration details (nectar teaspoon): Material does  not enter airway Pharyngeal - Nectar Cup: Reduced epiglottic inversion;Reduced  anterior laryngeal mobility;Reduced laryngeal  elevation;Pharyngeal residue - valleculae;Pharyngeal residue - cp  segment;Penetration/Aspiration after swallow Penetration/Aspiration details (nectar cup): Material enters  airway, remains ABOVE vocal cords and not ejected out;Material  does not enter airway Pharyngeal - Nectar Straw: Reduced epiglottic inversion;Reduced  anterior laryngeal mobility;Reduced laryngeal  elevation;Pharyngeal residue - valleculae;Pharyngeal residue - cp  segment;Penetration/Aspiration after swallow Penetration/Aspiration details (nectar straw): Material enters  airway, remains ABOVE vocal cords and not ejected  out;Material  does not enter airway Pharyngeal - Thin Pharyngeal - Thin Teaspoon: Not tested Pharyngeal - Thin Cup: Reduced epiglottic inversion;Reduced  anterior laryngeal mobility;Reduced laryngeal  elevation;Pharyngeal residue - valleculae;Pharyngeal residue - cp  segment;Penetration/Aspiration after swallow;Trace aspiration Penetration/Aspiration details (thin cup): Material enters  airway, passes BELOW cords without attempt by patient to eject  out (silent aspiration);Material enters airway, CONTACTS cords  and not ejected out Pharyngeal - Thin Straw: Not tested Pharyngeal - Solids Pharyngeal - Puree: Reduced epiglottic inversion;Reduced anterior  laryngeal mobility;Reduced laryngeal elevation;Pharyngeal residue  - valleculae;Pharyngeal residue - cp segment Penetration/Aspiration details (puree): Material does not enter  airway Pharyngeal - Regular: Reduced epiglottic inversion;Reduced  anterior laryngeal mobility;Reduced laryngeal  elevation;Pharyngeal residue - valleculae;Pharyngeal residue - cp  segment Pharyngeal - Pill: Not tested  Cervical Esophageal Phase    GO    Cervical Esophageal Phase Cervical Esophageal Phase: Impaired Cervical Esophageal Phase - Comment Cervical Esophageal Comment: see impression statement        Herbie Baltimore, MA CCC-SLP [phone]  Katherene Ponto Deblois 01/26/2014, 12:48 PM    Dg Swallowing Func-speech Pathology  01/21/2014   Katherene Ponto Deblois, CCC-SLP     01/21/2014 10:58 AM Objective Swallowing Evaluation: Modified Barium Swallowing Study   Patient Details  Name: LARSON LIMONES MRN: [id number] Date of Birth: 06-02-1941  Today's Date: 01/21/2014 Time: 0930-1000 SLP Time Calculation (min): 30 min  Past Medical History:  Past Medical History  Diagnosis Date  . Diabetes mellitus without complication   . Hypertension   . Hypercholesteremia    Past Surgical History:  Past Surgical History  Procedure Laterality Date  . Prostatectomy    . Colon resection     HPI:  73 year old male with  HTN and DM presented 5/27 to ED c/o N/V  since 5/25. In ED found to be in acute renal failure,  hyperkalemic, hypotensive with SBP's in 70s. Started on BiPAP for  hypoxia and increased WOB. Intubated from 5/28  to 6/1. Pt with  ICU delirium. Admission Chest CXR says New confluent bibasilar  pulmonary opacity, nonspecific but consider bilateral pneumonia  or aspiration.      Assessment / Plan / Recommendation Clinical Impression  Dysphagia Diagnosis: Moderate cervical esophageal phase  dysphagia;Severe pharyngeal phase dysphagia;Mild oral phase  dysphagia Clinical impression: Pt demonstrates a primary structural  cervical esophageal dysphagia, present at baseline, that is  likely worsed by pts mental status and generalized weakness. Pt  was awake, but required max cues to keep head up, bring cup to  lips and orally accept POs. Oral phase characterized by slow  transit. Pt noted to have the appearance of bony protrusion at  C5/6 and a prominent cricopharyngeus (no radiologist present to  confirm) as well as reduced laryngeal elevation and hyoid  excursion. These deficits result in incomplete airway closure  with silent aspiration during the swallow and residuals over the  CP segment and in the valleculae that are repentrated after the  swallow even if pt cued to clear his throat. Findings consistent  with possible admission with aspiration pna. Pt would be at high  risk of aspiration at this time and would benefit from several  days of recovery prior to retesting and attempting POs. Improved  mental status and general strength would be indicators for  retest. Recommend NPO with short term alternate nutrition. SLP  will revisit pt on Monday for possiblity of retest.     Treatment Recommendation  Therapy as outlined in treatment plan below    Diet Recommendation NPO;Alternative means - temporary   Medication Administration: Via alternative means    Other  Recommendations Oral Care Recommendations: Oral care Q4  per  protocol   Follow Up Recommendations  Skilled Nursing facility    Frequency and Duration min 3x week  2 weeks   Pertinent Vitals/Pain NA    SLP Swallow Goals     General HPI: 73 year old male with HTN and DM presented 5/27 to  ED c/o N/V since 5/25. In ED found to be in acute renal failure,  hyperkalemic, hypotensive with SBP's in 70s. Started on BiPAP for  hypoxia and increased WOB. Intubated from 5/28 to 6/1. Pt with  ICU delirium. Admission Chest CXR says New confluent bibasilar  pulmonary opacity, nonspecific but consider bilateral pneumonia  or aspiration.  Type of Study: Modified Barium Swallowing Study Reason for Referral: Objectively evaluate swallowing function Previous Swallow Assessment: none in chart Diet Prior to this Study: NPO Temperature Spikes Noted: No Respiratory Status: Nasal cannula History of Recent Intubation: Yes Length of Intubations (days): 5 days Date extubated: 01/18/14 Behavior/Cognition: Alert;Cooperative;Requires  cueing;Distractible Oral Cavity - Dentition: Dentures, top;Dentures, bottom Oral Motor / Sensory Function: Impaired - see Bedside swallow  eval Self-Feeding Abilities: Able to feed self;Needs assist Patient Positioning: Upright in chair Baseline Vocal Quality: Low vocal intensity;Wet Volitional Cough: Strong Volitional Swallow: Able to elicit Anatomy:  (bony protrustion at C5/6, Prominent CP, no radiologist  pres.) Pharyngeal Secretions:  (suspect standing secretions)    Reason for Referral Objectively evaluate swallowing function   Oral Phase Oral Preparation/Oral Phase Oral Phase: Impaired Oral - Honey Oral - Honey Teaspoon: Delayed oral transit Oral - Nectar Oral - Nectar Teaspoon: Delayed oral transit Oral - Nectar Cup: Delayed oral transit Oral - Thin Oral - Thin Teaspoon: Right anterior bolus loss;Left anterior  bolus loss;Delayed oral transit Oral - Thin Straw: Delayed oral transit (pt struggles to tip head  and cup back for thin cup) Oral -  Solids Oral - Puree: Within  functional limits   Pharyngeal Phase Pharyngeal Phase Pharyngeal Phase: Impaired Pharyngeal - Honey Pharyngeal - Honey Teaspoon: Reduced anterior laryngeal  mobility;Reduced epiglottic inversion;Reduced laryngeal  elevation;Reduced airway/laryngeal closure;Reduced tongue base  retraction;Penetration/Aspiration after  swallow;Penetration/Aspiration during swallow;Trace  aspiration;Pharyngeal residue - valleculae;Pharyngeal residue -  cp segment;Pharyngeal residue - pyriform sinuses;Compensatory  strategies attempted (Comment) (head turn left - not effective) Penetration/Aspiration details (honey teaspoon): Material enters  airway, CONTACTS cords and not ejected out;Material enters  airway, passes BELOW cords without attempt by patient to eject  out (silent aspiration) Pharyngeal - Nectar Pharyngeal - Nectar Teaspoon: Reduced anterior laryngeal  mobility;Reduced epiglottic inversion;Reduced laryngeal  elevation;Reduced airway/laryngeal closure;Reduced tongue base  retraction;Penetration/Aspiration after  swallow;Penetration/Aspiration during swallow;Trace  aspiration;Pharyngeal residue - valleculae;Pharyngeal residue -  cp segment;Pharyngeal residue - pyriform sinuses;Compensatory  strategies attempted (Comment) Penetration/Aspiration details (nectar teaspoon): Material enters  airway, CONTACTS cords and not ejected out;Material enters  airway, passes BELOW cords without attempt by patient to eject  out (silent aspiration) Pharyngeal - Nectar Cup: Reduced anterior laryngeal  mobility;Reduced epiglottic inversion;Reduced laryngeal  elevation;Reduced airway/laryngeal closure;Reduced tongue base  retraction;Penetration/Aspiration after  swallow;Penetration/Aspiration during swallow;Trace  aspiration;Pharyngeal residue - valleculae;Pharyngeal residue -  cp segment;Pharyngeal residue - pyriform sinuses;Compensatory  strategies attempted (Comment) Penetration/Aspiration details (nectar cup): Material enters  airway,  CONTACTS cords and not ejected out;Material enters  airway, passes BELOW cords without attempt by patient to eject  out (silent aspiration) Pharyngeal - Thin Pharyngeal - Thin Teaspoon: Not tested (all spilled from mouth) Pharyngeal - Thin Straw: Reduced anterior laryngeal  mobility;Reduced epiglottic inversion;Reduced laryngeal  elevation;Reduced airway/laryngeal closure;Reduced tongue base  retraction;Penetration/Aspiration during swallow;Pharyngeal  residue - valleculae;Pharyngeal residue - cp segment;Pharyngeal  residue - pyriform sinuses;Compensatory strategies attempted  (Comment);Moderate aspiration Penetration/Aspiration details (thin straw): Material enters  airway, passes BELOW cords without attempt by patient to eject  out (silent aspiration) Pharyngeal - Solids Pharyngeal - Puree: Reduced anterior laryngeal mobility;Reduced  epiglottic inversion;Reduced laryngeal elevation;Reduced  airway/laryngeal closure;Reduced tongue base  retraction;Pharyngeal residue - valleculae;Pharyngeal residue -  cp segment;Pharyngeal residue - pyriform sinuses;Compensatory  strategies attempted (Comment) Penetration/Aspiration details (puree): Material does not enter  airway  Cervical Esophageal Phase    GO    Cervical Esophageal Phase Cervical Esophageal Phase: Impaired Cervical Esophageal Phase - Comment Cervical Esophageal Comment: see impression statement        Herbie Baltimore, MA CCC-SLP [phone]  Katherene Ponto Deblois 01/21/2014, 10:56 AM        Microbiology: No results found for this or any previous visit (from the past 240 hour(s)).   Labs: Results for orders placed during the hospital encounter of 01/13/14 (from the past 48 hour(s))  GLUCOSE, CAPILLARY     Status: Abnormal   Collection Time    02/09/14 12:22 PM      Result Value Ref Range   Glucose-Capillary 113 (*) 70 - 99 mg/dL  GLUCOSE, CAPILLARY     Status: Abnormal   Collection Time    02/09/14  4:59 PM      Result Value Ref Range    Glucose-Capillary 123 (*) 70 - 99 mg/dL  GLUCOSE, CAPILLARY     Status: Abnormal   Collection Time    02/09/14  9:05 PM      Result Value Ref Range   Glucose-Capillary 118 (*) 70 - 99 mg/dL  BASIC METABOLIC PANEL     Status: Abnormal   Collection Time    02/10/14  5:27 AM      Result Value  Ref Range   Sodium 141  137 - 147 mEq/L   Potassium 4.3  3.7 - 5.3 mEq/L   Chloride 100  96 - 112 mEq/L   CO2 23  19 - 32 mEq/L   Glucose, Bld 90  70 - 99 mg/dL   BUN 56 (*) 6 - 23 mg/dL   Creatinine, Ser 4.51 (*) 0.50 - 1.35 mg/dL   Calcium 8.5  8.4 - 10.5 mg/dL   GFR calc non Af Amer 12 (*) >90 mL/min   GFR calc Af Amer 14 (*) >90 mL/min   Comment: (NOTE)     The eGFR has been calculated using the CKD EPI equation.     This calculation has not been validated in all clinical situations.     eGFR's persistently <90 mL/min signify possible Chronic Kidney     Disease.  CBC     Status: Abnormal   Collection Time    02/10/14  5:27 AM      Result Value Ref Range   WBC 11.2 (*) 4.0 - 10.5 K/uL   RBC 3.09 (*) 4.22 - 5.81 MIL/uL   Hemoglobin 8.7 (*) 13.0 - 17.0 g/dL   HCT 27.7 (*) 39.0 - 52.0 %   MCV 89.6  78.0 - 100.0 fL   MCH 28.2  26.0 - 34.0 pg   MCHC 31.4  30.0 - 36.0 g/dL   RDW 15.2  11.5 - 15.5 %   Platelets 304  150 - 400 K/uL  GLUCOSE, CAPILLARY     Status: None   Collection Time    02/10/14  7:58 AM      Result Value Ref Range   Glucose-Capillary 99  70 - 99 mg/dL   Comment 1 Notify RN     Comment 2 Documented in Chart    GLUCOSE, CAPILLARY     Status: Abnormal   Collection Time    02/10/14 12:00 PM      Result Value Ref Range   Glucose-Capillary 143 (*) 70 - 99 mg/dL   Comment 1 Notify RN     Comment 2 Documented in Chart    GLUCOSE, CAPILLARY     Status: None   Collection Time    02/10/14  4:32 PM      Result Value Ref Range   Glucose-Capillary 83  70 - 99 mg/dL   Comment 1 Notify RN     Comment 2 Documented in Chart    GLUCOSE, CAPILLARY     Status: Abnormal    Collection Time    02/10/14  9:46 PM      Result Value Ref Range   Glucose-Capillary 183 (*) 70 - 99 mg/dL  BASIC METABOLIC PANEL     Status: Abnormal   Collection Time    02/11/14  4:33 AM      Result Value Ref Range   Sodium 139  137 - 147 mEq/L   Potassium 3.8  3.7 - 5.3 mEq/L   Chloride 100  96 - 112 mEq/L   CO2 22  19 - 32 mEq/L   Glucose, Bld 92  70 - 99 mg/dL   BUN 50 (*) 6 - 23 mg/dL   Creatinine, Ser 3.92 (*) 0.50 - 1.35 mg/dL   Calcium 8.7  8.4 - 10.5 mg/dL   GFR calc non Af Amer 14 (*) >90 mL/min   GFR calc Af Amer 16 (*) >90 mL/min   Comment: (NOTE)     The eGFR has been calculated using the CKD EPI  equation.     This calculation has not been validated in all clinical situations.     eGFR's persistently <90 mL/min signify possible Chronic Kidney     Disease.  CBC     Status: Abnormal   Collection Time    02/11/14  4:33 AM      Result Value Ref Range   WBC 10.7 (*) 4.0 - 10.5 K/uL   RBC 2.96 (*) 4.22 - 5.81 MIL/uL   Hemoglobin 8.4 (*) 13.0 - 17.0 g/dL   HCT 26.6 (*) 39.0 - 52.0 %   MCV 89.9  78.0 - 100.0 fL   MCH 28.4  26.0 - 34.0 pg   MCHC 31.6  30.0 - 36.0 g/dL   RDW 14.9  11.5 - 15.5 %   Platelets 292  150 - 400 K/uL     HPI : year-old male with past medical history hypertension diabetes who presented to the emergency room on 5/27 with complaints of nausea and vomiting x2 days. The patient was found to be in severe acute renal failure with a creatinine of 9.36, potassium greater than 7.7 and was hypotensive. Patient noted to be hypoxic, likely from aspiration pneumonia from vomiting with volume overload and was started on BiPAP. He was admitted to the critical care service. Nephrology was consulted. Patient was started on pressor support, CVVH and was intubated.  ARF was felt to be multifactorial from hypovolemia, NSAID-induced nephropathy plus ACE inhibitor. Also high anion gap metabolic acidosis noted with metformin as contributing factor. Renal ultrasound  notes medical renal disease. Chest x-ray noted pneumonia and/or pulmonary edema. Patient was treated as septic shock with pressor support. Mildly elevated troponin plus questionable EKG changes, felt to have mild non-STEMI secondary to demand ischemia. He was started on on tube feedings.  Pt significantly improved and was extubated 6/1. Pressors and stress dose steroids were weaned off. Antibiotics were completed on 6/3. Patient was continued on tube feedings as there was concerns about chronic aspiration due to to decreased mentation even after extubation. Patient failed initial swallow eval 6/3.Speech therapy continue to follow and after modified barium swallow on 6/9, patient upgraded to dysphagia 3 diet with nectar thick liquids.  CERT was stopped on 6/2 and hemodialysis was initiated as the renal function did not improve. Patient was transferred to the floor to hospitalist service on 6/5.    Patient intermittently dialyzed with hopes renal function will improve. The patient's last dialysis was 6/18. He is currently making urine. First dialysis catheter accidentally removed by patient on 6/11 early morning. Catheter replaced on 6/14 by interventional radiology.    On 6/16, Patient started developing episodes of atrial flutter during dialysis, was treated with IV Lopressor.   Marland Kitchen   HOSPITAL COURSE #1 hypotension and septic shock Hypotensive, hypoxic likely from aspiration pneumonia due to vomiting with volume overload and started on BiPAP Also started on vasopressor support Aspiration pneumonia treated with Zosyn and vancomycin completed from 5/20 -6/3  #2 acute renal failure In the setting of hypotensive shock, initial creatinine 9.36, potassium 7.7 Admitted to critical care CRRT 5/28-6/2,CRRT stopped 6/2 and transitioned to inpatient hemodialysis  Improving kidney function, . Baseline Cr 1.05 in May 2015  Last creatinine prior to discharge is 3.92 on 6/25, last HD 6/18, Right TDC replaced  6/14 by interventional radiology. subsequently removed on 6/25  Last HD was 6/18. Vol status good, making urine. There are no indications for for further hemodialysis according to nephrology, creatinine down. Removed, cath, 6/25  Metabolic acidosis: Secondary to uremia. Initially from septic shock, resolved.    Hyperkalemia: Initial creatinine 9.36 and potassium of 7.7 Secondary renal failure. Initially treated with dialysis.  - Resolved    Thrombocytopenia: Resolved. Secondary to thrombocytopenia critical illness.  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  s/p PRBC's with HD 6/14; tsat low but ferritin of 2000 precludes use of Fe with HD. Added darbe 100/week Bensley (6/15). Should improve as renal function improves  Atrial flutter: On 6/16, Patient started developing episodes of significant tachycardia during dialysis. Rhythm assessment found to be aflutter.  Started on Coreg 6.25 twice a day   Diabetes mellitus:  - CBG stable. On sliding scale only. A1c 5.5.  - At this point, discontinue metformin permanently given renal failure and try to manage with dietary control only    Dysphagia: In part due to mentation.  - Upgraded by speech therapy to dysphagia 3, thin liquids. Patient has history of presbyesophagus. Speech therapy should follow even at skilled nursing facility    HTN: On Coreg stable  Hyperlipidemia    Chronic Diastolic heart failure: Echocardiogram done in the setting of ?Non-STEMI. Grade 2 diastolic dysfunction noted.   NSTEMI: Demand ischemia in the setting of septic shock. Resolved.  Continue aspirin and Coreg  Toe ulceration: Patient was scheduled by orthopedic surgery to have planned left fourth toe amputation done 6/25. Hospitalist left message for need for postponement         Discharge Exam:  Blood pressure 130/70, pulse 75, temperature 98.7 F (37.1 C), temperature source Oral, resp. rate 18, height _0  (1.803 m), weight 78.926 kg  (174 lb), SpO2 97.00%.  VS as noted  NAD  Regular S1S2 No S3  Lungs grossly clear  TDC dressing intact right chest  Abd soft and nontender  No edema whatsoever of the LE's        Discharge Instructions   Diet - low sodium heart healthy    Complete by:  As directed      Increase activity slowly    Complete by:  As directed              Signed: ABROL,NAYANA 02/11/2014, 9:43 AM

## 2014-02-11 NOTE — Procedures (Signed)
Successful removal RT IJ HD CATH NO COMP STABLE FULL REPORT IN PACS

## 2014-06-28 ENCOUNTER — Other Ambulatory Visit: Payer: Self-pay | Admitting: Family Medicine

## 2014-06-28 ENCOUNTER — Ambulatory Visit
Admission: RE | Admit: 2014-06-28 | Discharge: 2014-06-28 | Disposition: A | Discharge status: Home / Self Care | Payer: Medicare Other | Source: Ambulatory Visit | Attending: Family Medicine | Admitting: Family Medicine

## 2014-06-28 DIAGNOSIS — M545 Low back pain: Secondary | ICD-10-CM

## 2015-03-03 ENCOUNTER — Other Ambulatory Visit (HOSPITAL_COMMUNITY): Payer: Self-pay | Admitting: Family Medicine

## 2015-03-03 DIAGNOSIS — M48061 Spinal stenosis, lumbar region without neurogenic claudication: Secondary | ICD-10-CM

## 2015-03-04 ENCOUNTER — Encounter: Payer: Self-pay | Admitting: *Deleted

## 2015-03-04 ENCOUNTER — Ambulatory Visit (HOSPITAL_COMMUNITY)
Admission: RE | Admit: 2015-03-04 | Discharge: 2015-03-04 | Disposition: A | Discharge status: Home / Self Care | Payer: Medicare Other | Source: Ambulatory Visit | Attending: Family Medicine | Admitting: Family Medicine

## 2015-03-04 ENCOUNTER — Emergency Department
Admission: EM | Admit: 2015-03-04 | Discharge: 2015-03-05 | Disposition: A | Discharge status: Home / Self Care | Payer: Medicare Other | Attending: Emergency Medicine | Admitting: Emergency Medicine

## 2015-03-04 DIAGNOSIS — Z87891 Personal history of nicotine dependence: Secondary | ICD-10-CM | POA: Insufficient documentation

## 2015-03-04 DIAGNOSIS — Z79899 Other long term (current) drug therapy: Secondary | ICD-10-CM | POA: Diagnosis not present

## 2015-03-04 DIAGNOSIS — Z8546 Personal history of malignant neoplasm of prostate: Secondary | ICD-10-CM | POA: Diagnosis not present

## 2015-03-04 DIAGNOSIS — M545 Low back pain, unspecified: Secondary | ICD-10-CM

## 2015-03-04 DIAGNOSIS — Z7982 Long term (current) use of aspirin: Secondary | ICD-10-CM | POA: Insufficient documentation

## 2015-03-04 DIAGNOSIS — C799 Secondary malignant neoplasm of unspecified site: Secondary | ICD-10-CM | POA: Diagnosis not present

## 2015-03-04 DIAGNOSIS — I10 Essential (primary) hypertension: Secondary | ICD-10-CM | POA: Diagnosis not present

## 2015-03-04 DIAGNOSIS — E119 Type 2 diabetes mellitus without complications: Secondary | ICD-10-CM | POA: Insufficient documentation

## 2015-03-04 DIAGNOSIS — M4806 Spinal stenosis, lumbar region: Secondary | ICD-10-CM | POA: Diagnosis present

## 2015-03-04 DIAGNOSIS — M48061 Spinal stenosis, lumbar region without neurogenic claudication: Secondary | ICD-10-CM

## 2015-03-04 HISTORY — DX: Gastro-esophageal reflux disease without esophagitis: K21.9

## 2015-03-04 NOTE — ED Notes (Signed)
Pt was sent by dr. [place] Crane to see ed md after mri. Pt states "i might have prostate cancer and he sent me here to talk about my treatment options."

## 2015-03-04 NOTE — ED Provider Notes (Signed)
[facility] Emergency Department Provider Note  ____________________________________________  Time seen: Approximately 11:36 PM  I have reviewed the triage vital signs and the nursing notes.   HISTORY  Chief Complaint Back Pain    HPI OTHON Long is a 74 y.o. male who presents to the ED from home after MRI of lumbar spine performed at [facility] discuss treatment options". Patient has a remote history of prostate cancer in the 1990s s/p surgery who has been having several months' history of lower back pain. He has been treated by his PCP with analgesics and most recently a cortisone shot yesterday. He complains of lower back pain mostly at night with occasional numbness and tingling radiating to bilateral lower extremities. In the mornings, patient notes his legs feel weak. Patient denies bowel incontinence. Does state in the past week he had 2 episodes of urinary incontinence which he attributes to "not getting to the bathroom in time". Patient denies recent fall or injury secondary to leg weakness. Denies recent surgery.   Past Medical History  Diagnosis Date  . Diabetes mellitus without complication   . Hypertension   . Hypercholesteremia   . Cancer   . GERD (gastroesophageal reflux disease)     Patient Active Problem List   Diagnosis Date Noted  . Atrial flutter 02/02/2014  . Dysphagia 01/30/2014  . HTN (hypertension) 01/30/2014  . Other and unspecified hyperlipidemia 01/30/2014  . Overweight(278.02) 01/30/2014  . Aortic stenosis, moderate 01/30/2014  . Chronic diastolic heart failure 16/05/9603  . NSTEMI (non-ST elevated myocardial infarction) 01/30/2014  . Toe ulcer 01/30/2014  . Severe sepsis 01/14/2014  . Acute renal failure 01/14/2014  . Metabolic acidosis 54/04/8118  . Hyperkalemia 01/14/2014  . Thrombocytopenia 01/14/2014  . Anemia 01/14/2014  . Aspiration pneumonia 01/14/2014  . Diabetes mellitus 01/14/2014    Past Surgical  History  Procedure Laterality Date  . Prostatectomy    . Colon resection      Current Outpatient Rx  Name  Route  Sig  Dispense  Refill  . alendronate (FOSAMAX) 70 MG tablet   Oral   Take 1 tablet by mouth once a week. Patient takes on Sunday         . allopurinol (ZYLOPRIM) 300 MG tablet   Oral   Take 300 mg by mouth daily.         Marland Kitchen aspirin 81 MG chewable tablet   Oral   Chew 1 tablet (81 mg total) by mouth daily.   30 tablet   2   . carvedilol (COREG) 6.25 MG tablet   Oral   Take 1 tablet (6.25 mg total) by mouth 2 (two) times daily with a meal.   60 tablet   2   . lovastatin (MEVACOR) 20 MG tablet   Oral   Take 20 mg by mouth daily.         . Maltodextrin-Xanthan Gum (RESOURCE THICKENUP CLEAR) POWD      With 3 meals   1 Can   2   . oxybutynin (DITROPAN) 5 MG tablet   Oral   Take 5 mg by mouth 4 (four) times daily.         . pantoprazole (PROTONIX) 40 MG tablet   Oral   Take 1 tablet (40 mg total) by mouth daily before breakfast.   30 tablet   2   . verapamil (CALAN-SR) 240 MG CR tablet   Oral   Take 1 tablet (240 mg total) by mouth daily.  30 tablet   2     Allergies Review of patient's allergies indicates no known allergies.  History reviewed. No pertinent family history.  Social History History  Substance Use Topics  . Smoking status: Former Smoker -- 1.50 packs/day for 25 years    Types: Cigarettes    Quit date: 01/21/1974  . Smokeless tobacco: Never Used  . Alcohol Use: No    Review of Systems Constitutional: No fever/chills Eyes: No visual changes. ENT: No sore throat. Cardiovascular: Denies chest pain. Respiratory: Denies shortness of breath. Gastrointestinal: No abdominal pain.  No nausea, no vomiting.  No diarrhea.  No constipation. Genitourinary: Negative for dysuria. Musculoskeletal: Positive for back pain. Skin: Negative for rash. Neurological: Negative for headaches. Positive for occasional bilateral leg  weakness, numbness and tingling.  10-point ROS otherwise negative.  ____________________________________________   PHYSICAL EXAM:  VITAL SIGNS: ED Triage Vitals  Enc Vitals Group     BP 03/04/15 2129 152/79 mmHg     Pulse Rate 03/04/15 2129 62     Resp 03/04/15 2129 18     Temp 03/04/15 2129 97.9 F (36.6 C)     Temp Source 03/04/15 2129 Oral     SpO2 03/04/15 2129 96 %     Weight 03/04/15 2129 200 lb (90.719 kg)     Height 03/04/15 2129 5\' 11"  (1.803 m)     Head Cir --      Peak Flow --      Pain Score --      Pain Loc --      Pain Edu? --      Excl. in [place]? --     Constitutional: Alert and oriented. Well appearing and in no acute distress. Eyes: Conjunctivae are normal. PERRL. EOMI. Head: Atraumatic. Nose: No congestion/rhinnorhea. Mouth/Throat: Mucous membranes are moist.  Oropharynx non-erythematous. Neck: No stridor.   Cardiovascular: Normal rate, regular rhythm. II/VI SEM murmur. Grossly normal heart sounds.  Good peripheral circulation. Respiratory: Normal respiratory effort.  No retractions. Lungs CTAB. Gastrointestinal: Soft and nontender. No distention. No abdominal bruits. No CVA tenderness. Genitourinary: Strong bilateral cremaster reflexes. Rectal: Strong rectal tone. Excellent sensation to perianal and perineal areas. Musculoskeletal: Mild tenderness to palpation lumbar spine. No lower extremity tenderness nor edema.  No joint effusions. Neurologic:  Normal speech and language. No gross focal neurologic deficits are appreciated. No gait instability. Specifically, patient has 5/5 motor strength bilateral lower extremities. Slightly diminished reflex left ankle. No foot drop. Skin:  Skin is warm, dry and intact. No rash noted. Psychiatric: Mood and affect are normal. Speech and behavior are normal.  ____________________________________________   LABS (all labs ordered are listed, but only abnormal results are displayed)  Labs Reviewed - No data to  display ____________________________________________  EKG  None ____________________________________________  RADIOLOGY  MRI lumbar spine without contrast interpreted per Dr. Nevada Crane: 1. 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 2. Small bone metastases also suspected at L3 and L5. 3. Superimposed lower lumbar spine degeneration related to L3-L4 and L4-L5 spondylolisthesis including severe facet arthropathy and multifactorial severe degenerative spinal stenosis also at L3-L4. 4. Partially visible pelvic lymphadenopathy. 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 Findings discussed with the patient by telephone at 6:18 pm on 03/04/2015. I advised he go to the Emergency Room. He has a driver (his wife) and lives in [place], [place]. And  he would prefer to go to the [facility] Emergency Department for further evaluation and treatment. I will contact the emergency department there in anticipation of his arrival. ____________________________________________   PROCEDURES  Procedure(s) performed: None  Critical Care performed: No  ____________________________________________   INITIAL IMPRESSION / ASSESSMENT AND PLAN / ED COURSE  Pertinent labs & imaging results that were available during my care of the patient were reviewed by me and considered in my medical decision making (see chart for details).  74 year old male with a remote history of prostate cancer who had an MRI of his lumbar spine done earlier today, ordered by his PCP for a several month history of lower back pain associated with occasional numbness/tingling and weakness of his bilateral lower extremities; 2 episodes of urinary incontinence this week. The initial MRI report was taken by Dr. Thomasene Lot who conveys to me his discussion  with Dr. 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 I had an extensive discussion with the patient and his wife regarding these findings and treatment options. I did offer hospital admission for further evaluation and workup of patient's tumor. However, patient desires to go home and follow-up as an outpatient. I think this is reasonable given that patient's symptoms are still mild; specifically he does not have significant weakness in his lower extremities nor continuous urinary incontinence. I did speak with Dr. Ma Hillock from oncology who will see patient in the office early next week. I have given the patient and his wife strict return precautions. Both verbalize understanding and agree with plan of care. ____________________________________________   FINAL CLINICAL IMPRESSION(S) / ED DIAGNOSES  Final diagnoses:  Bilateral low back pain without sciatica  Metastasis      Paulette Blanch, MD 03/06/15 [phone]

## 2015-03-04 NOTE — ED Notes (Signed)
Pt presents w/ c/o back pain that has been persistent. Pt has outpatient MRI done at [facility] today and was told upon discharge to see ED doctor about the results of this imaging.

## 2015-03-05 NOTE — Discharge Instructions (Signed)
Your MRI shows that you have a tumor of your spine that will most likely require radiation. Please call Dr. Ma Hillock (oncology) Monday morning at 9:15am. Asked to speak with either him or his nurse Judeen Hammans and they will schedule your appointment. Return to the ER if you experience worsening weakness in your legs, worsening bladder incontinence, experience bowel incontinence or other concerns.  Back Pain, Adult Low back pain is very common. About 1 in 5 people have back pain.The cause of low back pain is rarely dangerous. The pain often gets better over time.About half of people with a sudden onset of back pain feel better in just 2 weeks. About 8 in 10 people feel better by 6 weeks.  CAUSES Some common causes of back pain include:  Strain of the muscles or ligaments supporting the spine.  Wear and tear (degeneration) of the spinal discs.  Arthritis.  Direct injury to the back. DIAGNOSIS Most of the time, the direct cause of low back pain is not known.However, back pain can be treated effectively even when the exact cause of the pain is unknown.Answering your caregiver's questions about your overall health and symptoms is one of the most accurate ways to make sure the cause of your pain is not dangerous. If your caregiver needs more information, he or she may order lab work or imaging tests (X-rays or MRIs).However, even if imaging tests show changes in your back, this usually does not require surgery. HOME CARE INSTRUCTIONS For many people, back pain returns.Since low back pain is rarely dangerous, it is often a condition that people can learn to [facility] their own.   Remain active. It is stressful on the back to sit or stand in one place. Do not sit, drive, or stand in one place for more than 30 minutes at a time. Take short walks on level surfaces as soon as pain allows.Try to increase the length of time you walk each day.  Do not stay in bed.Resting more than 1 or 2 days can delay your  recovery.  Do not avoid exercise or work.Your body is made to move.It is not dangerous to be active, even though your back may hurt.Your back will likely heal faster if you return to being active before your pain is gone.  Pay attention to your body when you bend and lift. Many people have less discomfortwhen lifting if they bend their knees, keep the load close to their bodies,and avoid twisting. Often, the most comfortable positions are those that put less stress on your recovering back.  Find a comfortable position to sleep. Use a firm mattress and lie on your side with your knees slightly bent. If you lie on your back, put a pillow under your knees.  Only take over-the-counter or prescription medicines as directed by your caregiver. Over-the-counter medicines to reduce pain and inflammation are often the most helpful.Your caregiver may prescribe muscle relaxant drugs.These medicines help dull your pain so you can more quickly return to your normal activities and healthy exercise.  Put ice on the injured area.  Put ice in a plastic bag.  Place a towel between your skin and the bag.  Leave the ice on for 15-20 minutes, 03-04 times a day for the first 2 to 3 days. After that, ice and heat may be alternated to reduce pain and spasms.  Ask your caregiver about trying back exercises and gentle massage. This may be of some benefit.  Avoid feeling anxious or stressed.Stress increases muscle tension and  can worsen back pain. It is important to recognize when you are anxious or stressed and learn ways to manage it. Exercise is a great option. °SEEK MEDICAL CARE IF: °· You have pain that is not relieved with rest or medicine. °· You have pain that does not improve in 1 week. °· You have new symptoms. °· You are generally not feeling well. °SEEK IMMEDIATE MEDICAL CARE IF:  °· You have pain that radiates from your back into your legs. °· You develop new bowel or bladder control problems. °· You  have unusual weakness or numbness in your arms or legs. °· You develop nausea or vomiting. °· You develop abdominal pain. °· You feel faint. °Document Released: 08/06/2005 Document Revised: 02/05/2012 Document Reviewed: 12/08/2013 °Exit[employer]® Patient Information ©2015 [employer]. This information is not intended to replace advice given to you by your health care provider. Make sure you discuss any questions you have with your health care provider. ° °

## 2015-03-07 ENCOUNTER — Encounter: Payer: Self-pay | Admitting: Radiation Oncology

## 2015-03-07 ENCOUNTER — Inpatient Hospital Stay: Payer: Medicare Other | Attending: Internal Medicine | Admitting: Internal Medicine

## 2015-03-07 ENCOUNTER — Ambulatory Visit
Admission: RE | Admit: 2015-03-07 | Discharge: 2015-03-07 | Disposition: A | Discharge status: Home / Self Care | Payer: Medicare Other | Source: Ambulatory Visit | Attending: Radiation Oncology | Admitting: Radiation Oncology

## 2015-03-07 VITALS — BP 152/89 | HR 64 | Temp 95.8°F | Resp 18 | Ht 71.0 in | Wt 207.0 lb

## 2015-03-07 DIAGNOSIS — R32 Unspecified urinary incontinence: Secondary | ICD-10-CM

## 2015-03-07 DIAGNOSIS — Z87891 Personal history of nicotine dependence: Secondary | ICD-10-CM | POA: Insufficient documentation

## 2015-03-07 DIAGNOSIS — E780 Pure hypercholesterolemia: Secondary | ICD-10-CM | POA: Diagnosis not present

## 2015-03-07 DIAGNOSIS — M4806 Spinal stenosis, lumbar region: Secondary | ICD-10-CM | POA: Insufficient documentation

## 2015-03-07 DIAGNOSIS — I10 Essential (primary) hypertension: Secondary | ICD-10-CM | POA: Insufficient documentation

## 2015-03-07 DIAGNOSIS — M545 Low back pain: Secondary | ICD-10-CM

## 2015-03-07 DIAGNOSIS — R05 Cough: Secondary | ICD-10-CM | POA: Insufficient documentation

## 2015-03-07 DIAGNOSIS — Z510 Encounter for antineoplastic radiation therapy: Secondary | ICD-10-CM | POA: Insufficient documentation

## 2015-03-07 DIAGNOSIS — Z79899 Other long term (current) drug therapy: Secondary | ICD-10-CM | POA: Insufficient documentation

## 2015-03-07 DIAGNOSIS — Z7982 Long term (current) use of aspirin: Secondary | ICD-10-CM | POA: Diagnosis not present

## 2015-03-07 DIAGNOSIS — R591 Generalized enlarged lymph nodes: Secondary | ICD-10-CM | POA: Insufficient documentation

## 2015-03-07 DIAGNOSIS — C61 Malignant neoplasm of prostate: Secondary | ICD-10-CM | POA: Diagnosis not present

## 2015-03-07 DIAGNOSIS — N1330 Unspecified hydronephrosis: Secondary | ICD-10-CM | POA: Diagnosis not present

## 2015-03-07 DIAGNOSIS — E119 Type 2 diabetes mellitus without complications: Secondary | ICD-10-CM | POA: Diagnosis not present

## 2015-03-07 DIAGNOSIS — C7951 Secondary malignant neoplasm of bone: Secondary | ICD-10-CM

## 2015-03-07 DIAGNOSIS — N134 Hydroureter: Secondary | ICD-10-CM | POA: Diagnosis not present

## 2015-03-07 DIAGNOSIS — K219 Gastro-esophageal reflux disease without esophagitis: Secondary | ICD-10-CM

## 2015-03-07 NOTE — Consult Note (Signed)
Except an outstanding is perfect of Radiation Oncology NEW PATIENT EVALUATION  Name: [name]  MRN: [id number]  Date:   03/07/2015     DOB: 1940/11/14   This 74 y.o. male patient presents to the clinic for initial evaluation of metastatic prostate cancer.  REFERRING PHYSICIAN: Tamsen Roers, MD  CHIEF COMPLAINT:back pain  DIAGNOSIS: There were no encounter diagnoses.   PREVIOUS INVESTIGATIONS:  MRI scan reviewed Clinical notes reviewed CT scan bone scan ordered will review when available  HPI: patient is a 74 year old male with a history of radical prostatectomy 10 years prior no further history and no adjuvant treatment recommended. Patient has been lost follow-up with urology although states his PSA was undetectable after surgery. He has presented over the past several months with progressive low back pain radicular in nature mostly extending down to the right lower extremity. He was seen in emergency room underwent MRI scan of his lumbar spine showing metastatic disease to L4 with epidural tumor contributing to severe spinal stenosis and right foraminal stenosis. There is also metastatic involvement of L3 L5. There is also noted visible pelvic lymphadenopathy and left hydronephrosis and left hydroureter suggesting malignant obstruction. I been asked to evaluate the patient for possible palliative radiation therapy to his lumbar spine. Further workup including bone scan, current PSA, and CT scan of chest abdomen and pelvis have all been ordered.  PLANNED TREATMENT REGIMEN: tied radiation therapy to lumbar spine  PAST MEDICAL HISTORY:  has a past medical history of Diabetes mellitus without complication; Hypertension; Hypercholesteremia; Cancer; and GERD (gastroesophageal reflux disease).    PAST SURGICAL HISTORY:  Past Surgical History  Procedure Laterality Date  . Prostatectomy    . Colon resection      FAMILY HISTORY: family history is not on file.  SOCIAL HISTORY:  reports  that he quit smoking about 41 years ago. His smoking use included Cigarettes. He has a 37.5 pack-year smoking history. He has never used smokeless tobacco. He reports that he does not drink alcohol or use illicit drugs.  ALLERGIES: Review of patient's allergies indicates no known allergies.  MEDICATIONS:  Current Outpatient Prescriptions  Medication Sig Dispense Refill  . alendronate (FOSAMAX) 70 MG tablet Take 1 tablet by mouth once a week. Patient takes on Sunday    . allopurinol (ZYLOPRIM) 300 MG tablet Take 300 mg by mouth daily.    Marland Kitchen aspirin 81 MG chewable tablet Chew 1 tablet (81 mg total) by mouth daily. 30 tablet 2  . carvedilol (COREG) 6.25 MG tablet Take 1 tablet (6.25 mg total) by mouth 2 (two) times daily with a meal. 60 tablet 2  . lovastatin (MEVACOR) 20 MG tablet Take 20 mg by mouth daily.    . Maltodextrin-Xanthan Gum (RESOURCE THICKENUP CLEAR) POWD With 3 meals 1 Can 2  . oxybutynin (DITROPAN) 5 MG tablet Take 5 mg by mouth 4 (four) times daily.    . pantoprazole (PROTONIX) 40 MG tablet Take 1 tablet (40 mg total) by mouth daily before breakfast. 30 tablet 2  . verapamil (CALAN-SR) 240 MG CR tablet Take 1 tablet (240 mg total) by mouth daily. 30 tablet 2   No current facility-administered medications for this encounter.    ECOG PERFORMANCE STATUS:  1 - Symptomatic but completely ambulatory  REVIEW OF SYSTEMS:  Patient denies any weight loss, fatigue, weakness, fever, chills or night sweats. Patient denies any loss of vision, blurred vision. Patient denies any ringing  of the ears or hearing loss. No irregular heartbeat.  Patient denies heart murmur or history of fainting. Patient denies any chest pain or pain radiating to her upper extremities. Patient denies any shortness of breath, difficulty breathing at night, cough or hemoptysis. Patient denies any swelling in the lower legs. Patient denies any nausea vomiting, vomiting of blood, or coffee ground material in the vomitus.  Patient denies any stomach pain. Patient states has had normal bowel movements no significant constipation or diarrhea. Patient denies any dysuria, hematuria or significant nocturia. Patient denies any problems walking, swelling in the joints or loss of balance. Patient denies any skin changes, loss of hair or loss of weight. Patient denies any excessive worrying or anxiety or significant depression. Patient denies any problems with insomnia. Patient denies excessive thirst, polyuria, polydipsia. Patient denies any swollen glands, patient denies easy bruising or easy bleeding. Patient denies any recent infections, allergies or URI. Patient "s visual fields have not changed significantly in recent time.    PHYSICAL EXAM: There were no vitals taken for this visit. Well-developed male in NAD. Deep palpation of the spine does not elicit pain. Motor sensory and DTR levels are equal and symmetric in the upper and lower extremities. Proprioception is intact. Range of motion of his lower extremities does not elicit pain. Well-developed well-nourished patient in NAD. HEENT reveals PERLA, EOMI, discs not visualized.  Oral cavity is clear. No oral mucosal lesions are identified. Neck is clear without evidence of cervical or supraclavicular adenopathy. Lungs are clear to A&P. Cardiac examination is essentially unremarkable with regular rate and rhythm without murmur rub or thrill. Abdomen is benign with no organomegaly or masses noted. Motor sensory and DTR levels are equal and symmetric in the upper and lower extremities. Cranial nerves II through XII are grossly intact. Proprioception is intact. No peripheral adenopathy or edema is identified. No motor or sensory levels are noted. Crude visual fields are within normal range.   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  PLAN: this time I recommended palliative radiation therapy to his lumbar spine. Would plan on delivering 3000 cGy in 10 fractions. Risks and benefits of treatment including possible diarrhea, skin reaction, alteration of blood counts, fatigue, all were discussed in detail with the patient. CT scan of chest abdomen and pelvis have been ordered as well as bone scan which I will have available to review prior to my simulation which I have set up and ordered for later this week. Patient will also benefit from possible [facility] agonist based on pathology and PSA score.I have discussed the case personally with medical oncology will continue to follow the patient.  I would like to take this opportunity for allowing me to participate in the care of your patient.Armstead Peaks., MD

## 2015-03-07 NOTE — Progress Notes (Signed)
[name]  [phone] Fax:(336) [phone]     ID: [name] OB: February 21, 1941  MR#: [id number]  FAO#:[id number]  Patient Care Team: Tamsen Roers, MD as PCP - General (Family Medicine)  CHIEF COMPLAINT/DIAGNOSIS:  Progressive low back pain times few months with lumbar metastasis on MRI, prior history of prostate cancer.  (MRI lumbar spine on 03/04/15 IMPRESSION: 1. 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 2. Small bone metastases also suspected at L3 and L5. 3. Superimposed lower lumbar spine degeneration related to L3-L4 and L4-L5 spondylolisthesis including severe facet arthropathy and multifactorial severe degenerative spinal stenosis also at L3-L4. 4. Partially visible pelvic lymphadenopathy. 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 (Old records not available, patient reports limited details, reportedly diagnosed with prostate cancer in the 1990s and underwent prostatectomy, subsequent PSA reportedly undetectable but did not follow-up after 2 years post surgery).  -  Patient referred here by ED for Oncology evaluation and management.   HISTORY OF PRESENT ILLNESS:  [name] is a 74 year old gentleman with past medical history as described below. Patient remembers limited details about prostate cancer, states that he was diagnosed sometime in the 199 and underwent prostatectomy by Dr. Etta Quill in [place], that he remembers PSA being undetectable post surgery and followed up for a couple of years and after that was lost to follow-up. He is unclear if he had any recent PSA done. Over the last few months, he has developed progressive low back pain sometimes radiating down the right lower extremity. He denies any focal muscle weakness but has had one or 2 episodes of urinary  incontinence which he attributes to not being able to get to the restroom on time. He denies any hematuria. Otherwise appetite is good and denies unintentional weight loss. Denies other history of cancers. States that he had colonoscopy in 2012 and was unremarkable as much as he can remember. He has occasional cough, no dyspnea chest pain or hemoptysis. No new headaches imbalance or falls.  REVIEW OF SYSTEMS:   ROS CONSTITUTIONAL: As in HPI above. No chills, fever or sweats.    ENT:  No headache, dizziness or epistaxis. No ear or jaw pain. No sinus symptoms. RESPIRATORY:   No cough.  No shortness of breath. No wheezing. No hemoptysis. CARDIAC:  No palpitations.  No retrosternal chest pain. No orthopnea, PND. GI:  No abdominal pain, nausea or vomiting. No diarrhea.   GU:  No dysuria or hematuria.  SKIN: No rashes or pruritus. HEMATOLOGIC: denies bleeding symptoms MUSCULOSKELETAL:  As in history of present illness  EXTREMITY:  No new swelling or pain.  NEURO:  No focal weakness. No numbness or tingling of extremities.  No seizures.   ENDOCRINE:  No polyuria or polydipsia.   PS ECOG 1  PAST MEDICAL HISTORY: Reviewed. Past Medical History  Diagnosis Date  . Diabetes mellitus without complication   . Hypertension   . Hypercholesteremia   . Cancer   . GERD (gastroesophageal reflux disease)   Records not available, patient reports limited details, reportedly diagnosed with prostate cancer in the 1990s and underwent prostatectomy, subsequent PSA reportedly undetectable but did not follow-up after 2 years post surgery.  PAST SURGICAL HISTORY: Reviewed. Past Surgical History  Procedure Laterality Date  . Prostatectomy    . Colon resection      FAMILY HISTORY: Reviewed. Remarkable for  breast cancer, brain tumor. Denies prostate cancer.  SOCIAL HISTORY: Reviewed. History  Substance Use Topics  . Smoking status: Former Smoker -- 1.50 packs/day for 25 years    Types: Cigarettes    Quit  date: 01/21/1974  . Smokeless tobacco: Never Used  . Alcohol Use: No    No Known Allergies  Current Outpatient Prescriptions  Medication Sig Dispense Refill  . alendronate (FOSAMAX) 70 MG tablet Take 1 tablet by mouth once a week. Patient takes on Sunday    . allopurinol (ZYLOPRIM) 300 MG tablet Take 300 mg by mouth daily.    Marland Kitchen aspirin 81 MG chewable tablet Chew 1 tablet (81 mg total) by mouth daily. 30 tablet 2  . carvedilol (COREG) 6.25 MG tablet Take 1 tablet (6.25 mg total) by mouth 2 (two) times daily with a meal. 60 tablet 2  . lovastatin (MEVACOR) 20 MG tablet Take 20 mg by mouth daily.    . Maltodextrin-Xanthan Gum (RESOURCE THICKENUP CLEAR) POWD With 3 meals 1 Can 2  . oxybutynin (DITROPAN) 5 MG tablet Take 5 mg by mouth 4 (four) times daily.    . pantoprazole (PROTONIX) 40 MG tablet Take 1 tablet (40 mg total) by mouth daily before breakfast. 30 tablet 2  . verapamil (CALAN-SR) 240 MG CR tablet Take 1 tablet (240 mg total) by mouth daily. 30 tablet 2   No current facility-administered medications for this visit.    PHYSICAL EXAM: Filed Vitals:   03/07/15 1351  BP: 152/89  Pulse: 64  Temp:   Resp:      Body mass index is 28.89 kg/(m^2).    ECOG FS:1 - Symptomatic but completely ambulatory  GENERAL: Patient is alert and oriented and in no acute distress. There is no icterus. HEENT: EOMs intact. Oral exam negative for thrush or lesions. No cervical lymphadenopathy. CVS: S1S2, regular LUNGS: Bilaterally clear to auscultation, no rhonchi. ABDOMEN: Soft, nontender. No hepatosplenomegaly clinically.  NEURO: grossly nonfocal, cranial nerves are intact. Gait unremarkable. EXTREMITIES: No pedal edema. LYMPHATICS: No palpable adenopathy in axillary or inguinal areas. SKIN: no rashes or major bruising MUSCULOSKELETAL: no obvious joint rednessSWELLING   LAB RESULTS:    Component Value Date/Time   NA 139 02/11/2014 0433   K 3.8 02/11/2014 0433   CL 100 02/11/2014 0433    CO2 22 02/11/2014 0433   GLUCOSE 92 02/11/2014 0433   BUN 50* 02/11/2014 0433   CREATININE 3.92* 02/11/2014 0433   CALCIUM 8.7 02/11/2014 0433   PROT 6.8 01/21/2014 0420   ALBUMIN 2.2* 02/08/2014 0350   AST 52* 01/21/2014 0420   ALT 39 01/21/2014 0420   ALKPHOS 92 01/21/2014 0420   BILITOT 0.6 01/21/2014 0420   GFRNONAA 14* 02/11/2014 0433   GFRAA 16* 02/11/2014 0433   Lab Results  Component Value Date   WBC 10.7* 02/11/2014   NEUTROABS 14.4* 01/13/2014   HGB 8.4* 02/11/2014   HCT 26.6* 02/11/2014   MCV 89.9 02/11/2014   PLT 292 02/11/2014     STUDIES: Mr Lumbar Spine Wo Contrast  03/04/2015   ADDENDUM REPORT: 03/04/2015 19:07  ADDENDUM: Correction: The patient subsequently informed me that [facility] is his preferred site of treatment. I called ahead to [facility] at 1835 hrs and spoke to the ED physician there.   Electronically Signed   By: Genevie Ann M.D.   On: 03/04/2015 19:07   03/04/2015   CLINICAL DATA:  74 year old male who reports prostate cancer treatment in the 1990s. Severe low back pain  radiating to both lower extremities, abnormal lower extremity reflexes, and loss of bladder and bowel control for 1 week. Initial encounter.  EXAM: MRI LUMBAR SPINE WITHOUT CONTRAST  TECHNIQUE: Multiplanar, multisequence MR imaging of the lumbar spine was performed. No intravenous contrast was administered.  COMPARISON:  Lumbar radiographs 06/28/2014.  FINDINGS: The L4 level was abnormally sclerotic on the 2015 comparison, and demonstrated superior endplate deformity which appears stable today. However, on this study the L4 body and right posterior element marrow appears completely replaced by low T1 signal heterogeneously STIR hyperintense tumor. There is ventral and right lateral epidural tumor demonstrated on series 7, image 19. Superimposed severe spinal stenosis at the mid L4 level, when superimposed on some degenerative changes (detailed below). Evidence of tumor involvement  of the right L4 neural foramen (series 3, image 4). The left L4 posterior elements and spinous process are relatively spared.  There are small round T1 hypo intense foci in the L5 and L3 vertebral bodies suspicious for small metastases. However, otherwise no other lumbar or visible sacral metastasis.  Normal noncontrast appearance of the lower thoracic spinal cord with conus at L1-L2.  Left hydronephrosis and hydroureter throughout the visible course of the ureter. Left pelvic iliac chain lymphadenopathy is visible on sagittal images (series 3, image 11) with rounded nodes up to 8 mm short axis, abnormally increased in number.  There is superimposed degenerative disease in the lumbar spine also resulting in severe spinal and moderate to severe foraminal stenosis at L3-L4 (series 6, image 16). Severe facet hypertrophy at L3-L4 and L4-L5 contributes to the L4 level stenosis. This facet disease is also related to mild grade 1 anterolisthesis at each level.  IMPRESSION: 1. 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 2. Small bone metastases also suspected at L3 and L5. 3. Superimposed lower lumbar spine degeneration related to L3-L4 and L4-L5 spondylolisthesis including severe facet arthropathy and multifactorial severe degenerative spinal stenosis also at L3-L4. 4. Partially visible pelvic lymphadenopathy. 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 Findings discussed with the patient by telephone at 6:18 pm on 03/04/2015. I advised he go to the Emergency Room. He has a driver (his wife) and lives in [place], [place]. And he would prefer to go to the [facility] Emergency Department for further evaluation and treatment. I will contact the emergency department there in anticipation of his arrival.  Electronically Signed: By: Genevie Ann  M.D. On: 03/04/2015 18:19     ASSESSMENT / PLAN:   Progressive low back pain times few months with lumbar metastasis on MRI, prior history of prostate cancer.  (MRI lumbar spine on 03/04/15 IMPRESSION: 1. 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 2. Small bone metastases also suspected at L3 and L5. 3. Superimposed lower lumbar spine degeneration related to L3-L4 and L4-L5 spondylolisthesis including severe facet arthropathy and multifactorial severe degenerative spinal stenosis also at L3-L4. 4. Partially visible pelvic lymphadenopathy. 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 (Old records not available, patient reports limited details, reportedly diagnosed with prostate cancer in the 1990s and underwent prostatectomy, subsequent PSA reportedly undetectable but did not follow-up after 2 years post surgery). -  Patient referred here by ED for Oncology evaluation and management.  Reviewed records from ED and recent MRI report and discussed with patient. Currently he is taking oxycodone when necessary and states her pain is under better control, he does get intermittent radiation of pain noted right lower extremity. Given prior history of prostate cancer with above mentioned MRI report, most likely possibility would be recurrent metastatic prostate cancer but will need evaluation to rule out other metastatic malignancy from a different primary. Accordingly will draw labs today for CBC, comprehensive metabolic panel, baseline PT and PTT in case we need to do a biopsy, serum PSA, CEA, SPEP and random UPEP. Will get CT scan of the chest/abdomen/pelvis (without IV contrast given the elevated creatinine last year of >3) along with a bone scan as soon as possible. He will meet with radiation oncologist Dr. Baruch Gouty today to  see if he needs to start palliative radiation quickly. He does not have any obvious neurological deficits at this time otherwise. I have explained to patient above details, that we may need to pursue biopsy if indicated, and that if he does have metastatic malignancy this would represent stage IV disease which is incurable and treatments offered with would be with palliative intent only.     In between visits, the patient has been advised to call or come to the ER in case of fevers, chills, bleeding, acute sickness, or new symptoms. Patient is agreeable to this plan.   Leia Alf, MD   03/07/2015 2:34 PM

## 2015-03-08 ENCOUNTER — Ambulatory Visit
Admission: RE | Admit: 2015-03-08 | Discharge: 2015-03-08 | Disposition: A | Discharge status: Home / Self Care | Payer: Medicare Other | Source: Ambulatory Visit | Attending: Internal Medicine | Admitting: Internal Medicine

## 2015-03-08 DIAGNOSIS — C7951 Secondary malignant neoplasm of bone: Secondary | ICD-10-CM | POA: Insufficient documentation

## 2015-03-08 DIAGNOSIS — R911 Solitary pulmonary nodule: Secondary | ICD-10-CM | POA: Insufficient documentation

## 2015-03-08 DIAGNOSIS — C61 Malignant neoplasm of prostate: Secondary | ICD-10-CM

## 2015-03-10 ENCOUNTER — Ambulatory Visit
Admission: RE | Admit: 2015-03-10 | Discharge: 2015-03-10 | Disposition: A | Discharge status: Home / Self Care | Payer: Medicare Other | Source: Ambulatory Visit | Attending: Radiation Oncology | Admitting: Radiation Oncology

## 2015-03-10 ENCOUNTER — Encounter
Admission: RE | Admit: 2015-03-10 | Discharge: 2015-03-10 | Disposition: A | Discharge status: Home / Self Care | Payer: Medicare Other | Source: Ambulatory Visit | Attending: Internal Medicine | Admitting: Internal Medicine

## 2015-03-10 DIAGNOSIS — Z510 Encounter for antineoplastic radiation therapy: Secondary | ICD-10-CM | POA: Diagnosis not present

## 2015-03-10 DIAGNOSIS — C61 Malignant neoplasm of prostate: Secondary | ICD-10-CM | POA: Diagnosis not present

## 2015-03-10 MED ORDER — TECHNETIUM TC 99M MEDRONATE IV KIT
25.0000 | PACK | Freq: Once | INTRAVENOUS | Status: AC | PRN
  Administered 2015-03-10: 22.336 via INTRAVENOUS

## 2015-03-11 ENCOUNTER — Other Ambulatory Visit: Payer: Self-pay | Admitting: *Deleted

## 2015-03-11 DIAGNOSIS — C61 Malignant neoplasm of prostate: Secondary | ICD-10-CM | POA: Diagnosis not present

## 2015-03-11 DIAGNOSIS — C7951 Secondary malignant neoplasm of bone: Secondary | ICD-10-CM

## 2015-03-15 ENCOUNTER — Ambulatory Visit
Admission: RE | Admit: 2015-03-15 | Discharge: 2015-03-15 | Disposition: A | Discharge status: Home / Self Care | Payer: Medicare Other | Source: Ambulatory Visit | Attending: Radiation Oncology | Admitting: Radiation Oncology

## 2015-03-15 DIAGNOSIS — C61 Malignant neoplasm of prostate: Secondary | ICD-10-CM | POA: Diagnosis not present

## 2015-03-16 ENCOUNTER — Ambulatory Visit
Admission: RE | Admit: 2015-03-16 | Discharge: 2015-03-16 | Disposition: A | Discharge status: Home / Self Care | Payer: Medicare Other | Source: Ambulatory Visit | Attending: Radiation Oncology | Admitting: Radiation Oncology

## 2015-03-16 DIAGNOSIS — C61 Malignant neoplasm of prostate: Secondary | ICD-10-CM | POA: Diagnosis not present

## 2015-03-17 ENCOUNTER — Ambulatory Visit
Admission: RE | Admit: 2015-03-17 | Discharge: 2015-03-17 | Disposition: A | Discharge status: Home / Self Care | Payer: Medicare Other | Source: Ambulatory Visit | Attending: Radiation Oncology | Admitting: Radiation Oncology

## 2015-03-17 DIAGNOSIS — C61 Malignant neoplasm of prostate: Secondary | ICD-10-CM | POA: Diagnosis not present

## 2015-03-18 ENCOUNTER — Ambulatory Visit
Admission: RE | Admit: 2015-03-18 | Discharge: 2015-03-18 | Disposition: A | Discharge status: Home / Self Care | Payer: Medicare Other | Source: Ambulatory Visit | Attending: Radiation Oncology | Admitting: Radiation Oncology

## 2015-03-18 DIAGNOSIS — C61 Malignant neoplasm of prostate: Secondary | ICD-10-CM | POA: Diagnosis not present

## 2015-03-21 ENCOUNTER — Ambulatory Visit
Admission: RE | Admit: 2015-03-21 | Discharge: 2015-03-21 | Disposition: A | Discharge status: Home / Self Care | Payer: Medicare Other | Source: Ambulatory Visit | Attending: Radiation Oncology | Admitting: Radiation Oncology

## 2015-03-21 DIAGNOSIS — C61 Malignant neoplasm of prostate: Secondary | ICD-10-CM | POA: Diagnosis not present

## 2015-03-22 ENCOUNTER — Ambulatory Visit
Admission: RE | Admit: 2015-03-22 | Discharge: 2015-03-22 | Disposition: A | Discharge status: Home / Self Care | Payer: Medicare Other | Source: Ambulatory Visit | Attending: Radiation Oncology | Admitting: Radiation Oncology

## 2015-03-22 DIAGNOSIS — C61 Malignant neoplasm of prostate: Secondary | ICD-10-CM | POA: Diagnosis not present

## 2015-03-23 ENCOUNTER — Ambulatory Visit
Admission: RE | Admit: 2015-03-23 | Discharge: 2015-03-23 | Disposition: A | Discharge status: Home / Self Care | Payer: Medicare Other | Source: Ambulatory Visit | Attending: Radiation Oncology | Admitting: Radiation Oncology

## 2015-03-23 ENCOUNTER — Inpatient Hospital Stay: Payer: Medicare Other | Attending: Radiation Oncology

## 2015-03-23 ENCOUNTER — Telehealth: Payer: Self-pay | Admitting: *Deleted

## 2015-03-23 DIAGNOSIS — E119 Type 2 diabetes mellitus without complications: Secondary | ICD-10-CM | POA: Diagnosis not present

## 2015-03-23 DIAGNOSIS — C61 Malignant neoplasm of prostate: Secondary | ICD-10-CM | POA: Diagnosis present

## 2015-03-23 DIAGNOSIS — Z79818 Long term (current) use of other agents affecting estrogen receptors and estrogen levels: Secondary | ICD-10-CM | POA: Insufficient documentation

## 2015-03-23 DIAGNOSIS — Z7982 Long term (current) use of aspirin: Secondary | ICD-10-CM | POA: Insufficient documentation

## 2015-03-23 DIAGNOSIS — E780 Pure hypercholesterolemia: Secondary | ICD-10-CM | POA: Diagnosis not present

## 2015-03-23 DIAGNOSIS — R972 Elevated prostate specific antigen [PSA]: Secondary | ICD-10-CM | POA: Diagnosis not present

## 2015-03-23 DIAGNOSIS — M549 Dorsalgia, unspecified: Secondary | ICD-10-CM | POA: Insufficient documentation

## 2015-03-23 DIAGNOSIS — Z87891 Personal history of nicotine dependence: Secondary | ICD-10-CM | POA: Diagnosis not present

## 2015-03-23 DIAGNOSIS — R918 Other nonspecific abnormal finding of lung field: Secondary | ICD-10-CM | POA: Insufficient documentation

## 2015-03-23 DIAGNOSIS — I10 Essential (primary) hypertension: Secondary | ICD-10-CM | POA: Insufficient documentation

## 2015-03-23 DIAGNOSIS — Z79899 Other long term (current) drug therapy: Secondary | ICD-10-CM | POA: Diagnosis not present

## 2015-03-23 DIAGNOSIS — N134 Hydroureter: Secondary | ICD-10-CM | POA: Insufficient documentation

## 2015-03-23 DIAGNOSIS — K219 Gastro-esophageal reflux disease without esophagitis: Secondary | ICD-10-CM | POA: Diagnosis not present

## 2015-03-23 DIAGNOSIS — C7951 Secondary malignant neoplasm of bone: Secondary | ICD-10-CM | POA: Insufficient documentation

## 2015-03-23 LAB — CBC
HCT: 38.5 % — ABNORMAL LOW (ref 40.0–52.0)
Hemoglobin: 12.7 g/dL — ABNORMAL LOW (ref 13.0–18.0)
MCH: 29 pg (ref 26.0–34.0)
MCHC: 32.8 g/dL (ref 32.0–36.0)
MCV: 88.5 fL (ref 80.0–100.0)
Platelets: 184 K/uL (ref 150–440)
RBC: 4.36 MIL/uL — ABNORMAL LOW (ref 4.40–5.90)
RDW: 15.5 % — ABNORMAL HIGH (ref 11.5–14.5)
WBC: 12.5 K/uL — ABNORMAL HIGH (ref 3.8–10.6)

## 2015-03-23 NOTE — Telephone Encounter (Signed)
When pt was seen for the first time and then he went to see Dr. Baruch Gouty he did not get to the lab that day and wanted to see if he could get labs done tom. Before he goes to radiation.  Pt agreeable with plan and an order for lab only entered for tom 1 pm.

## 2015-03-24 ENCOUNTER — Inpatient Hospital Stay: Payer: Medicare Other

## 2015-03-24 ENCOUNTER — Other Ambulatory Visit: Payer: Self-pay | Admitting: *Deleted

## 2015-03-24 ENCOUNTER — Ambulatory Visit
Admission: RE | Admit: 2015-03-24 | Discharge: 2015-03-24 | Disposition: A | Discharge status: Home / Self Care | Payer: Medicare Other | Source: Ambulatory Visit | Attending: Radiation Oncology | Admitting: Radiation Oncology

## 2015-03-24 DIAGNOSIS — C61 Malignant neoplasm of prostate: Secondary | ICD-10-CM | POA: Diagnosis not present

## 2015-03-24 LAB — DIFFERENTIAL
BAND NEUTROPHILS: 10 % (ref 0–10)
BASOS PCT: 1 % (ref 0–1)
Blasts: 0 %
Eosinophils Relative: 7 % — ABNORMAL HIGH (ref 0–5)
LYMPHS PCT: 11 % — AB (ref 12–46)
Metamyelocytes Relative: 2 %
Monocytes Relative: 3 % (ref 3–12)
Myelocytes: 0 %
NEUTROS PCT: 66 % (ref 43–77)
NRBC: 0 /100{WBCs}
Other: 0 %
PROMYELOCYTES ABS: 0 %

## 2015-03-24 LAB — COMPREHENSIVE METABOLIC PANEL
ALT: 12 U/L — AB (ref 17–63)
ANION GAP: 6 (ref 5–15)
AST: 16 U/L (ref 15–41)
Albumin: 3.6 g/dL (ref 3.5–5.0)
Alkaline Phosphatase: 81 U/L (ref 38–126)
BILIRUBIN TOTAL: 0.4 mg/dL (ref 0.3–1.2)
BUN: 27 mg/dL — ABNORMAL HIGH (ref 6–20)
CALCIUM: 9.8 mg/dL (ref 8.9–10.3)
CO2: 24 mmol/L (ref 22–32)
CREATININE: 1.6 mg/dL — AB (ref 0.61–1.24)
Chloride: 104 mmol/L (ref 101–111)
GFR calc Af Amer: 48 mL/min — ABNORMAL LOW (ref >60)
GFR, EST NON AFRICAN AMERICAN: 41 mL/min — AB (ref >60)
Glucose, Bld: 139 mg/dL — ABNORMAL HIGH (ref 65–99)
Potassium: 4.2 mmol/L (ref 3.5–5.1)
Sodium: 134 mmol/L — ABNORMAL LOW (ref 135–145)
TOTAL PROTEIN: 6.8 g/dL (ref 6.5–8.1)

## 2015-03-24 LAB — PROTIME-INR
INR: 1.01
PROTHROMBIN TIME: 13.5 s (ref 11.4–15.0)

## 2015-03-24 LAB — APTT: APTT: 30 s (ref 24–36)

## 2015-03-24 LAB — PSA: PSA: 48.22 ng/mL — AB (ref 0.00–4.00)

## 2015-03-25 ENCOUNTER — Inpatient Hospital Stay: Payer: Medicare Other | Admitting: Internal Medicine

## 2015-03-25 ENCOUNTER — Other Ambulatory Visit: Payer: Self-pay | Admitting: *Deleted

## 2015-03-25 ENCOUNTER — Ambulatory Visit
Admission: RE | Admit: 2015-03-25 | Discharge: 2015-03-25 | Disposition: A | Discharge status: Home / Self Care | Payer: Medicare Other | Source: Ambulatory Visit | Attending: Radiation Oncology | Admitting: Radiation Oncology

## 2015-03-25 DIAGNOSIS — C61 Malignant neoplasm of prostate: Secondary | ICD-10-CM | POA: Diagnosis not present

## 2015-03-25 DIAGNOSIS — R9389 Abnormal findings on diagnostic imaging of other specified body structures: Secondary | ICD-10-CM

## 2015-03-25 LAB — PROTEIN ELECTROPHORESIS, SERUM
A/G Ratio: 1 (ref 0.7–1.7)
Albumin ELP: 3 g/dL (ref 2.9–4.4)
Alpha-1-Globulin: 0.2 g/dL (ref 0.0–0.4)
Alpha-2-Globulin: 1 g/dL (ref 0.4–1.0)
BETA GLOBULIN: 1 g/dL (ref 0.7–1.3)
GAMMA GLOBULIN: 0.8 g/dL (ref 0.4–1.8)
Globulin, Total: 3 g/dL (ref 2.2–3.9)
Total Protein ELP: 6 g/dL (ref 6.0–8.5)

## 2015-03-25 LAB — PROTEIN ELECTRO, RANDOM URINE
Albumin ELP, Urine: 42.5 %
Alpha-1-Globulin, U: 2.9 %
Alpha-2-Globulin, U: 11.4 %
BETA GLOBULIN, U: 35.4 %
GAMMA GLOBULIN, U: 7.7 %
TOTAL PROTEIN, URINE-UPE24: 26.4 mg/dL

## 2015-03-25 LAB — CEA: CEA: 2.3 ng/mL (ref 0.0–4.7)

## 2015-03-25 LAB — TESTOSTERONE: TESTOSTERONE: 228 ng/dL — AB (ref 348–1197)

## 2015-03-28 ENCOUNTER — Ambulatory Visit
Admission: RE | Admit: 2015-03-28 | Discharge: 2015-03-28 | Disposition: A | Discharge status: Home / Self Care | Payer: Medicare Other | Source: Ambulatory Visit | Attending: Radiation Oncology | Admitting: Radiation Oncology

## 2015-03-28 DIAGNOSIS — C61 Malignant neoplasm of prostate: Secondary | ICD-10-CM | POA: Diagnosis not present

## 2015-03-29 ENCOUNTER — Ambulatory Visit
Admission: RE | Admit: 2015-03-29 | Discharge: 2015-03-29 | Disposition: A | Discharge status: Home / Self Care | Payer: Medicare Other | Source: Ambulatory Visit | Attending: Radiation Oncology | Admitting: Radiation Oncology

## 2015-03-29 DIAGNOSIS — C61 Malignant neoplasm of prostate: Secondary | ICD-10-CM | POA: Diagnosis not present

## 2015-03-31 ENCOUNTER — Other Ambulatory Visit: Payer: Self-pay | Admitting: *Deleted

## 2015-03-31 ENCOUNTER — Ambulatory Visit (INDEPENDENT_AMBULATORY_CARE_PROVIDER_SITE_OTHER): Payer: Medicare Other | Admitting: Urology

## 2015-03-31 ENCOUNTER — Encounter: Payer: Self-pay | Admitting: Urology

## 2015-03-31 ENCOUNTER — Telehealth: Payer: Self-pay | Admitting: *Deleted

## 2015-03-31 VITALS — BP 122/79 | HR 75 | Resp 18 | Ht 71.0 in | Wt 204.7 lb

## 2015-03-31 DIAGNOSIS — Z8546 Personal history of malignant neoplasm of prostate: Secondary | ICD-10-CM

## 2015-03-31 DIAGNOSIS — N429 Disorder of prostate, unspecified: Secondary | ICD-10-CM

## 2015-03-31 DIAGNOSIS — D696 Thrombocytopenia, unspecified: Secondary | ICD-10-CM

## 2015-03-31 DIAGNOSIS — N4289 Other specified disorders of prostate: Secondary | ICD-10-CM

## 2015-03-31 LAB — URINALYSIS, COMPLETE
Bilirubin, UA: NEGATIVE
GLUCOSE, UA: NEGATIVE
Ketones, UA: NEGATIVE
NITRITE UA: NEGATIVE
RBC, UA: NEGATIVE
Specific Gravity, UA: 1.025 (ref 1.005–1.030)
Urobilinogen, Ur: 0.2 mg/dL (ref 0.2–1.0)
pH, UA: 6 (ref 5.0–7.5)

## 2015-03-31 LAB — MICROSCOPIC EXAMINATION: Epithelial Cells (non renal): NONE SEEN /hpf (ref 0–10)

## 2015-03-31 NOTE — Progress Notes (Signed)
03/31/2015 4:01 PM   [name] Jun 03, 1941 [id number]  Referring provider: Tamsen Roers, MD [address], [place] [address]  Chief Complaint  Patient presents with  . Results    Abnormal CT  . Acute Renal Failure    HPI: Patient presents with voiding copious. An radical prostatectomy in 1992. He claims he's had PSAs since but has not seen a urologist since. He been on oxybutynin chloride for incontinence and the oxybutynin chloride really helps him. He claims his PSAs have been normal. I do not know what they're number is. He also had colon surgery for colon cancer. Incision extends from his xiphoid to his pubic symphysis. Patient has no back pain or bone pain at this time. I do not know what his original resected prostate tumor was or his original PSA. He did not have it done by a local urologist. He has had some incontinence ever since his surgery but it hasn't been stress incontinence since been overactive bladder incontinence well-controlled with his anticholinergic HPI   PMH: Past Medical History  Diagnosis Date  . Diabetes mellitus without complication   . Hypertension   . Hypercholesteremia   . Cancer   . GERD (gastroesophageal reflux disease)     Surgical History: Past Surgical History  Procedure Laterality Date  . Prostatectomy    . Colon resection      Home Medications:    Medication List       This list is accurate as of: 03/31/15  4:01 PM.  Always use your most recent med list.               alendronate 70 MG tablet  Commonly known as:  FOSAMAX  Take 1 tablet by mouth once a week. Patient takes on Sunday     allopurinol 300 MG tablet  Commonly known as:  ZYLOPRIM  Take 300 mg by mouth daily.     aspirin 81 MG chewable tablet  Chew 1 tablet (81 mg total) by mouth daily.     carvedilol 6.25 MG tablet  Commonly known as:  COREG  Take 1 tablet (6.25 mg total) by mouth 2 (two) times daily with a meal.     lovastatin 20 MG tablet  Commonly  known as:  MEVACOR  Take 20 mg by mouth daily.     oxybutynin 5 MG tablet  Commonly known as:  DITROPAN  Take 5 mg by mouth 4 (four) times daily.     pantoprazole 40 MG tablet  Commonly known as:  PROTONIX  Take 1 tablet (40 mg total) by mouth daily before breakfast.     ranitidine 150 MG tablet  Commonly known as:  ZANTAC  Take 150 mg by mouth 2 (two) times daily.     RESOURCE THICKENUP CLEAR Powd  With 3 meals     verapamil 240 MG CR tablet  Commonly known as:  CALAN-SR  Take 1 tablet (240 mg total) by mouth daily.        Allergies: No Known Allergies  Family History: Family History  Problem Relation Age of Onset  . Aneurysm Father   . Cancer Mother     Social History:  reports that he quit smoking about 41 years ago. His smoking use included Cigarettes. He has a 37.5 pack-year smoking history. He has never used smokeless tobacco. He reports that he does not drink alcohol or use illicit drugs.  ROS: UROLOGY Frequent Urination?: Yes Hard to postpone urination?: No Burning/pain  with urination?: No Get up at night to urinate?: Yes Leakage of urine?: Yes Urine stream starts and stops?: No Trouble starting stream?: No Do you have to strain to urinate?: No Blood in urine?: No Urinary tract infection?: No Sexually transmitted disease?: No Injury to kidneys or bladder?: No Painful intercourse?: No Weak stream?: No Erection problems?: No Penile pain?: No  Gastrointestinal Nausea?: No Vomiting?: No Indigestion/heartburn?: No Diarrhea?: No Constipation?: No  Constitutional Fever: No Night sweats?: No Weight loss?: No Fatigue?: No  Skin Skin rash/lesions?: No Itching?: No  Eyes Blurred vision?: No Double vision?: No  Ears/Nose/Throat Sore throat?: No Sinus problems?: No  Hematologic/Lymphatic Swollen glands?: No Easy bruising?: No  Cardiovascular Leg swelling?: No Chest pain?: No  Respiratory Cough?: No Shortness of breath?:  No  Endocrine Excessive thirst?: No  Musculoskeletal Back pain?: No Joint pain?: No  Neurological Headaches?: No Dizziness?: No  Psychologic Depression?: No Anxiety?: No  Physical Exam: BP 122/79 mmHg  Pulse 75  Resp 18  Ht 5\' 11"  (1.803 m)  Wt 204 lb 11.2 oz (92.851 kg)  BMI 28.56 kg/m2  Constitutional:  Alert and oriented, No acute distress. HEENT: [place] AT, moist mucus membranes.  Trachea midline, no masses. Cardiovascular: No clubbing, cyanosis, or edema. Respiratory: Normal respiratory effort, no increased work of breathing. GI: Abdomen is soft, nontender, nondistended, no abdominal masses GU: No CVA tenderness. Heart irregular rectal mass feels to be recurrent prostate cancer. Cystoscopy will be done in the termination of biopsy after that PSA is obtained. Penis and testes are normal. Circumcised patient no masses hydroceles spermatoceles. Skin: No rashes, bruises or suspicious lesions. Lymph: No cervical or inguinal adenopathy. Neurologic: Grossly intact, no focal deficits, moving all 4 extremities. Psychiatric: Normal mood and affect.  Laboratory Data: Lab Results  Component Value Date   WBC 12.5* 03/23/2015   HGB 12.7* 03/23/2015   HCT 38.5* 03/23/2015   MCV 88.5 03/23/2015   PLT 184 03/23/2015    Lab Results  Component Value Date   CREATININE 1.60* 03/24/2015    Lab Results  Component Value Date   PSA 48.22* 03/24/2015    Lab Results  Component Value Date   TESTOSTERONE 228* 03/24/2015    Lab Results  Component Value Date   HGBA1C 5.5 01/30/2014    Urinalysis    Component Value Date/Time   COLORURINE YELLOW 01/13/2014 2146   APPEARANCEUR CLOUDY* 01/13/2014 2146   LABSPEC 1.017 01/13/2014 2146   PHURINE 5.0 01/13/2014 2146   GLUCOSEU NEGATIVE 01/13/2014 2146   HGBUR LARGE* 01/13/2014 2146   BILIRUBINUR NEGATIVE 01/13/2014 2146   KETONESUR NEGATIVE 01/13/2014 2146   PROTEINUR 100* 01/13/2014 2146   UROBILINOGEN 0.2 01/13/2014 2146    NITRITE NEGATIVE 01/13/2014 2146   LEUKOCYTESUR NEGATIVE 01/13/2014 2146    Pertinent Imaging: CT scan reviewed. No hydronephrosis no renal lesions there appears to be a impression on the base of the bladder that appears to be as prostate. This is correlated with his rectal exam. Assessment and Plan: Patient needs a current PSA for recurrent mass that appears to be prostatic in origin. This is a hard nodular mass in his rectum and do not think it is rectal carcinoma. I will do a cystoscopy on him make sure he doesn't have a prostatic extension into his bladder but it appears that he does on CT scan. Cystoscopy will be done within the next month. Anxiously await his PSA      Problem List Items Addressed This Visit    None  Visit Diagnoses    Prostate mass    -  Primary    Relevant Orders    Urinalysis, Complete    PSA       No Follow-up on file.  Collier Flowers, [facility] [address] [place], [place] [address] [phone]

## 2015-03-31 NOTE — Telephone Encounter (Signed)
Spoke with [name] at [facility] about [name] per Dr. Beverly Gust request. She confirmed that they will check patient's labwork at every visit and give Epogen injections as needed.

## 2015-04-01 ENCOUNTER — Telehealth: Payer: Self-pay | Admitting: *Deleted

## 2015-04-01 ENCOUNTER — Other Ambulatory Visit: Payer: Self-pay | Admitting: *Deleted

## 2015-04-01 LAB — PSA: PROSTATE SPECIFIC AG, SERUM: 48.3 ng/mL — AB (ref 0.0–4.0)

## 2015-04-01 MED ORDER — BICALUTAMIDE 50 MG PO TABS: 50.0000 mg | ORAL_TABLET | Freq: Every day | ORAL | Status: DC

## 2015-04-01 NOTE — Telephone Encounter (Signed)
Had called pt and left message and pt called back. rtn call that dr Ma Hillock would like him to start on casodex 50mg  tablets one a day to get him started on something for his prostate cancer.  His bx will be 8/17 and went over instructions for him and Juanita in radiology will call him later today to go over instructions also.  Patient has appt 8/22 1: 15 for labs and 1:30 to see md and afterwards depending on bx results pt will start lupron and xgeva. Pt agreeable to plan. Medication called into pharmacy

## 2015-04-05 ENCOUNTER — Other Ambulatory Visit: Payer: Self-pay | Admitting: Radiology

## 2015-04-05 ENCOUNTER — Telehealth: Payer: Self-pay | Admitting: *Deleted

## 2015-04-05 ENCOUNTER — Other Ambulatory Visit: Payer: Self-pay | Admitting: Physician Assistant

## 2015-04-05 NOTE — Telephone Encounter (Signed)
Patient called ot state he has 8 AM appt for bx tomorrow and a an appt with Dr Ma Hillock at 1:30. After checking his chart, the Dr Ma Hillock appt has been cancelled adn pt notified of this adn confirmed appt for 8/22

## 2015-04-06 ENCOUNTER — Ambulatory Visit: Payer: Medicare Other | Admitting: Internal Medicine

## 2015-04-06 ENCOUNTER — Other Ambulatory Visit: Payer: Self-pay | Admitting: Internal Medicine

## 2015-04-06 ENCOUNTER — Ambulatory Visit
Admission: RE | Admit: 2015-04-06 | Discharge: 2015-04-06 | Disposition: A | Discharge status: Home / Self Care | Payer: Medicare Other | Source: Ambulatory Visit | Attending: Internal Medicine | Admitting: Internal Medicine

## 2015-04-06 DIAGNOSIS — R9389 Abnormal findings on diagnostic imaging of other specified body structures: Secondary | ICD-10-CM

## 2015-04-06 DIAGNOSIS — C61 Malignant neoplasm of prostate: Secondary | ICD-10-CM | POA: Diagnosis not present

## 2015-04-06 DIAGNOSIS — C7951 Secondary malignant neoplasm of bone: Secondary | ICD-10-CM | POA: Diagnosis not present

## 2015-04-06 DIAGNOSIS — I10 Essential (primary) hypertension: Secondary | ICD-10-CM | POA: Insufficient documentation

## 2015-04-06 DIAGNOSIS — E785 Hyperlipidemia, unspecified: Secondary | ICD-10-CM | POA: Insufficient documentation

## 2015-04-06 DIAGNOSIS — E119 Type 2 diabetes mellitus without complications: Secondary | ICD-10-CM | POA: Insufficient documentation

## 2015-04-06 DIAGNOSIS — K219 Gastro-esophageal reflux disease without esophagitis: Secondary | ICD-10-CM | POA: Insufficient documentation

## 2015-04-06 DIAGNOSIS — R590 Localized enlarged lymph nodes: Secondary | ICD-10-CM | POA: Diagnosis not present

## 2015-04-06 DIAGNOSIS — R938 Abnormal findings on diagnostic imaging of other specified body structures: Secondary | ICD-10-CM | POA: Diagnosis present

## 2015-04-06 LAB — CBC
HCT: 39.6 % — ABNORMAL LOW (ref 40.0–52.0)
Hemoglobin: 12.9 g/dL — ABNORMAL LOW (ref 13.0–18.0)
MCH: 29.1 pg (ref 26.0–34.0)
MCHC: 32.6 g/dL (ref 32.0–36.0)
MCV: 89.3 fL (ref 80.0–100.0)
PLATELETS: 230 10*3/uL (ref 150–440)
RBC: 4.44 MIL/uL (ref 4.40–5.90)
RDW: 15.5 % — ABNORMAL HIGH (ref 11.5–14.5)
WBC: 10.2 10*3/uL (ref 3.8–10.6)

## 2015-04-06 LAB — APTT: aPTT: 31 seconds (ref 24–36)

## 2015-04-06 LAB — PROTIME-INR
INR: 1.04
PROTHROMBIN TIME: 13.8 s (ref 11.4–15.0)

## 2015-04-06 MED ORDER — SODIUM CHLORIDE 0.9 % IV SOLN
INTRAVENOUS | Status: DC
  Administered 2015-04-06: 09:00:00 via INTRAVENOUS

## 2015-04-06 NOTE — Sedation Documentation (Signed)
Procedure cancelled after CT scan pre-procedure-"bowel in the way".

## 2015-04-06 NOTE — H&P (Signed)
Chief Complaint: Metastatic prostate carcinoma.  Referring Physician(s): Pandit,Sandeep  History of Present Illness: [name] is a 74 y.o. male here for lymph node biopsy.  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  Presenting for CT guided biopsy.  Past Medical History  Diagnosis Date  . Diabetes mellitus without complication   . Hypertension   . Hypercholesteremia   . Cancer   . GERD (gastroesophageal reflux disease)     Past Surgical History  Procedure Laterality Date  . Prostatectomy    . Colon resection      Allergies: Review of patient's allergies indicates no known allergies.  Medications: Prior to Admission medications   Medication Sig Start Date End Date Taking? Authorizing Provider  allopurinol (ZYLOPRIM) 300 MG tablet Take 300 mg by mouth daily. 01/07/14  Yes Historical Provider, MD  aspirin 81 MG chewable tablet Chew 1 tablet (81 mg total) by mouth daily. 02/11/14  Yes Reyne Dumas, MD  bicalutamide (CASODEX) 50 MG tablet Take 1 tablet (50 mg total) by mouth daily. 04/01/15  Yes Leia Alf, MD  carvedilol (COREG) 6.25 MG tablet Take 1 tablet (6.25 mg total) by mouth 2 (two) times daily with a meal. 02/11/14  Yes Reyne Dumas, MD  lovastatin (MEVACOR) 20 MG tablet Take 20 mg by mouth daily. 12/07/13  Yes Historical Provider, MD  oxybutynin (DITROPAN) 5 MG tablet Take 5 mg by mouth 4 (four) times daily. 01/04/14  Yes Historical Provider, MD  pantoprazole (PROTONIX) 40 MG tablet Take 1 tablet (40 mg total) by mouth daily before breakfast. 02/11/14  Yes Reyne Dumas, MD  ranitidine (ZANTAC) 150 MG tablet Take 150 mg by mouth 2 (two) times daily.   Yes Historical Provider, MD  verapamil (CALAN-SR) 240 MG CR tablet Take 1 tablet (240 mg total) by mouth daily. 02/11/14  Yes Reyne Dumas, MD     Family History  Problem Relation Age of Onset  . Aneurysm Father   . Cancer Mother      Social History   Social History  . Marital Status: Married    Spouse Name: N/A  . Number of Children: N/A  . Years of Education: N/A   Social History Main Topics  . Smoking status: Former Smoker -- 1.50 packs/day for 25 years    Types: Cigarettes    Quit date: 01/21/1974  . Smokeless tobacco: Never Used  . Alcohol Use: No  . Drug Use: No  . Sexual Activity: Not Asked   Other Topics Concern  . None   Social History Narrative     Review of Systems: A 12 point ROS discussed and pertinent positives are indicated in the HPI above.  All other systems are negative.  Review of Systems  Constitutional: Negative.   Respiratory: Negative.   Cardiovascular: Negative.   Gastrointestinal: Negative.     Vital Signs: BP 123/70 mmHg  Pulse 61  Temp(Src) 98.1 F (36.7 C) (Oral)  Resp 18  Ht 5\' 11"  (1.803 m)  Wt 199 lb (90.266 kg)  BMI 27.77 kg/m2  SpO2 94%  Physical Exam  Constitutional: No distress.  Cardiovascular: Normal rate, regular rhythm and normal heart sounds.  Exam reveals no gallop and no friction rub.   No murmur heard. Pulmonary/Chest: Effort normal and breath sounds normal. No respiratory distress. He has no wheezes. He has no rales.    Mallampati Score:  MD Evaluation Airway: WNL Heart: WNL Abdomen: WNL  Chest/ Lungs: WNL ASA  Classification: 2 Mallampati/Airway Score: One  Imaging: Ct Abdomen Pelvis Wo Contrast  03/08/2015   CLINICAL DATA:  Prostate cancer, restaging. History of lumbar spine metastatic disease.  EXAM: CT CHEST, ABDOMEN AND PELVIS WITHOUT CONTRAST  TECHNIQUE: Multidetector CT imaging of the chest, abdomen and pelvis was performed following the standard protocol without IV contrast.  COMPARISON:  Lumbar MRI 03/04/2015.  No prior CT for comparison.  FINDINGS: CT CHEST FINDINGS  Mediastinum/Nodes: Thyroid is inhomogeneous without measurable mass. Representative pretracheal node measures 1.1 cm image 24. Moderate atheromatous aortic and  coronary arterial calcification without aneurysm. Heart size is normal. No pericardial effusion. No axillary lymphadenopathy.  Lungs/Pleura: Thin curvilinear probable scarring abutting the minor fissure image 32. No measurable nodule or consolidation in the right lung. 4 mm left upper lobe pulmonary parenchymal nodule image 26. Thin curvilinear/patchy opacity within the posterior inferior left upper lobe image 23. Central airways are patent. No pleural effusion.  Upper abdomen: Please see dedicated report below.  Musculoskeletal: Healed posterior left rib fracture. No lytic or sclerotic osseous lesion within the thoracic spine.  CT ABDOMEN AND PELVIS FINDINGS  Hepato biliary: Dependent tiny stones or sludge within the otherwise normal-appearing gallbladder. Liver is normal.  Pancreas: Pancreas appears normal.  Spleen: Normal  Adrenals/Urinary Tract: Multiple nonobstructing bilateral renal calculi are identified measuring 1-2 mm maximally. Asymmetric mild left renal cortical atrophy is present. Bilateral renal cortical cysts are identified, largest left mid kidney 3.7 cm. There is moderate to severe left hydroureteronephrosis to the level of the left ureterovesicular junction with adjacent surgical clip artifact and mass at the base of the prostate bed measuring 3.8 x 3.1 cm image 118. The bladder is decompressed.  Stomach/Bowel: Moderate to large stool burden. There is no clear fat plane between the mass in the prostate bed and the anterior wall of the rectum and colonic invasion is possible. This is felt much less likely to represent a primary exophytic colonic carcinoma. Stomach and small bowel are unremarkable.  Vascular/Lymphatic: 1 cm low retroperitoneal representative lymph node is identified image 100. Representative right external iliac chain lymph node measures 0.9 cm image 102. No upper abdominal lymphadenopathy. Moderate atheromatous aortic calcification without aneurysm.  Other: No ascites or free air.  Evidence of pelvic sidewall dissection.  Musculoskeletal: Compression deformity of L5 reidentified with sclerosis of the previously evaluated metastatic lesion at L4. Probable right femoral bone island.  IMPRESSION: Prostatic bed mass with probable secondary compression or postsurgical adhesion producing moderate to severe left hydroureteronephrosis.  No clear fat plane is visible between this mass and the anterior aspect of the rectum, raising the question of rectal invasion. Primary exophytic rectal adenocarcinoma is felt less likely.  Retroperitoneal and left external iliac chain lymphadenopathy compatible with metastatic disease.  L4 osseous metastatic disease with interval sclerosis.  4 mm left upper lobe pulmonary parenchymal nodule, amenable to presumed followup that feature for staining exams.   Electronically Signed   By: Conchita Paris M.D.   On: 03/08/2015 13:09   Ct Chest Wo Contrast  03/08/2015   CLINICAL DATA:  Prostate cancer, restaging. History of lumbar spine metastatic disease.  EXAM: CT CHEST, ABDOMEN AND PELVIS WITHOUT CONTRAST  TECHNIQUE: Multidetector CT imaging of the chest, abdomen and pelvis was performed following the standard protocol without IV contrast.  COMPARISON:  Lumbar MRI 03/04/2015.  No prior CT for comparison.  FINDINGS: CT CHEST FINDINGS  Mediastinum/Nodes: Thyroid is inhomogeneous without measurable mass. Representative pretracheal node measures 1.1 cm  image 24. Moderate atheromatous aortic and coronary arterial calcification without aneurysm. Heart size is normal. No pericardial effusion. No axillary lymphadenopathy.  Lungs/Pleura: Thin curvilinear probable scarring abutting the minor fissure image 32. No measurable nodule or consolidation in the right lung. 4 mm left upper lobe pulmonary parenchymal nodule image 26. Thin curvilinear/patchy opacity within the posterior inferior left upper lobe image 23. Central airways are patent. No pleural effusion.  Upper abdomen:  Please see dedicated report below.  Musculoskeletal: Healed posterior left rib fracture. No lytic or sclerotic osseous lesion within the thoracic spine.  CT ABDOMEN AND PELVIS FINDINGS  Hepato biliary: Dependent tiny stones or sludge within the otherwise normal-appearing gallbladder. Liver is normal.  Pancreas: Pancreas appears normal.  Spleen: Normal  Adrenals/Urinary Tract: Multiple nonobstructing bilateral renal calculi are identified measuring 1-2 mm maximally. Asymmetric mild left renal cortical atrophy is present. Bilateral renal cortical cysts are identified, largest left mid kidney 3.7 cm. There is moderate to severe left hydroureteronephrosis to the level of the left ureterovesicular junction with adjacent surgical clip artifact and mass at the base of the prostate bed measuring 3.8 x 3.1 cm image 118. The bladder is decompressed.  Stomach/Bowel: Moderate to large stool burden. There is no clear fat plane between the mass in the prostate bed and the anterior wall of the rectum and colonic invasion is possible. This is felt much less likely to represent a primary exophytic colonic carcinoma. Stomach and small bowel are unremarkable.  Vascular/Lymphatic: 1 cm low retroperitoneal representative lymph node is identified image 100. Representative right external iliac chain lymph node measures 0.9 cm image 102. No upper abdominal lymphadenopathy. Moderate atheromatous aortic calcification without aneurysm.  Other: No ascites or free air. Evidence of pelvic sidewall dissection.  Musculoskeletal: Compression deformity of L5 reidentified with sclerosis of the previously evaluated metastatic lesion at L4. Probable right femoral bone island.  IMPRESSION: Prostatic bed mass with probable secondary compression or postsurgical adhesion producing moderate to severe left hydroureteronephrosis.  No clear fat plane is visible between this mass and the anterior aspect of the rectum, raising the question of rectal invasion.  Primary exophytic rectal adenocarcinoma is felt less likely.  Retroperitoneal and left external iliac chain lymphadenopathy compatible with metastatic disease.  L4 osseous metastatic disease with interval sclerosis.  4 mm left upper lobe pulmonary parenchymal nodule, amenable to presumed followup that feature for staining exams.   Electronically Signed   By: Conchita Paris M.D.   On: 03/08/2015 13:09   Nm Bone Scan Whole Body  03/10/2015   CLINICAL DATA:  Prostate cancer.  EXAM: NUCLEAR MEDICINE WHOLE BODY BONE SCAN  TECHNIQUE: Whole body anterior and posterior images were obtained approximately 3 hours after intravenous injection of radiopharmaceutical.  RADIOPHARMACEUTICALS:  22.3 mCi Technetium-80m MDP IV  COMPARISON:  CT 03/08/2015 .  FINDINGS: Increased activity noted over the left renal collecting system ureter consistent with known hydronephrosis and hydroureter. Intense L4 activity noted consistent with known metastatic disease.Minimal activity noted in the right aspect of the L2 vertebral body. Mild metastatic disease to this vertebral body cannot be excluded. Punctate area of increased activity noted along a right mid posterior rib consistent with prior fracture as noted on CT. Mild increased activity noted over the left posterior mid rib. This could represent a site of old trauma or subtle metastatic disease. Mild activity noted over both knees and ankles consistent degenerative change. Similar findings noted over both sternoclavicular joints.  IMPRESSION: 1. Increased activity noted over the left renal collecting  system and ureter consistent with no hydronephrosis and hydroureter. 2. Intense increased activity noted over the L4 vertebral body consist with known metastatic disease. Faint punctate area of increased activity noted over the right portion of the L2 vertebral body. This could represent a tiny metastatic focus. 3. Focal area of increased activity noted a right posterior rib consistent with  a site of prior fracture. 4. Punctate focal air of increased activity noted over a posterior left rib. This could be related to prior fracture or a tiny metastatic focus.   Electronically Signed   By: Marcello Moores  Register   On: 03/10/2015 12:23    Labs:  CBC:  Recent Labs  03/23/15 1256 04/06/15 0803  WBC 12.5* 10.2  HGB 12.7* 12.9*  HCT 38.5* 39.6*  PLT 184 230    COAGS:  Recent Labs  03/24/15 1245 04/06/15 0803  INR 1.01 1.04  APTT 30 31    BMP:  Recent Labs  03/24/15 1245  NA 134*  K 4.2  CL 104  CO2 24  GLUCOSE 139*  BUN 27*  CALCIUM 9.8  CREATININE 1.60*  GFRNONAA 41*  GFRAA 48*    LIVER FUNCTION TESTS:  Recent Labs  03/24/15 1245  BILITOT 0.4  AST 16  ALT 12*  ALKPHOS 81  PROT 6.8  ALBUMIN 3.6    TUMOR MARKERS:  Recent Labs  03/24/15 1245  CEA 2.3    Assessment and Plan:  For CT guided right external iliac lymph node biopsy today.  Difficult window by prior CT with possible bowel in way and node near iliac vessels.  Will scan, potentially in different positions, to determine window for biopsy.  May require hydrodissection.  May not be safe window for biopsy.  Risks and Benefits discussed with the patient including, but not limited to bleeding, infection, damage to adjacent structures or low yield requiring additional tests. All of the patient's questions were answered, patient is agreeable to proceed. Consent signed and in chart.   SignedAletta Edouard T 04/06/2015, 9:08 AM

## 2015-04-08 ENCOUNTER — Ambulatory Visit: Payer: Medicare Other | Admitting: Internal Medicine

## 2015-04-11 ENCOUNTER — Inpatient Hospital Stay: Payer: Medicare Other

## 2015-04-11 ENCOUNTER — Inpatient Hospital Stay (HOSPITAL_BASED_OUTPATIENT_CLINIC_OR_DEPARTMENT_OTHER): Payer: Medicare Other | Admitting: Internal Medicine

## 2015-04-11 VITALS — BP 147/76 | HR 64 | Temp 95.8°F | Resp 18 | Ht 71.0 in | Wt 204.8 lb

## 2015-04-11 DIAGNOSIS — C7951 Secondary malignant neoplasm of bone: Secondary | ICD-10-CM

## 2015-04-11 DIAGNOSIS — Z87891 Personal history of nicotine dependence: Secondary | ICD-10-CM

## 2015-04-11 DIAGNOSIS — Z79899 Other long term (current) drug therapy: Secondary | ICD-10-CM

## 2015-04-11 DIAGNOSIS — I10 Essential (primary) hypertension: Secondary | ICD-10-CM

## 2015-04-11 DIAGNOSIS — C61 Malignant neoplasm of prostate: Secondary | ICD-10-CM

## 2015-04-11 DIAGNOSIS — N134 Hydroureter: Secondary | ICD-10-CM

## 2015-04-11 DIAGNOSIS — E119 Type 2 diabetes mellitus without complications: Secondary | ICD-10-CM

## 2015-04-11 DIAGNOSIS — Z7982 Long term (current) use of aspirin: Secondary | ICD-10-CM

## 2015-04-11 DIAGNOSIS — E780 Pure hypercholesterolemia: Secondary | ICD-10-CM

## 2015-04-11 DIAGNOSIS — R972 Elevated prostate specific antigen [PSA]: Secondary | ICD-10-CM

## 2015-04-11 DIAGNOSIS — K219 Gastro-esophageal reflux disease without esophagitis: Secondary | ICD-10-CM

## 2015-04-11 DIAGNOSIS — Z79818 Long term (current) use of other agents affecting estrogen receptors and estrogen levels: Secondary | ICD-10-CM

## 2015-04-11 DIAGNOSIS — R918 Other nonspecific abnormal finding of lung field: Secondary | ICD-10-CM

## 2015-04-11 DIAGNOSIS — M549 Dorsalgia, unspecified: Secondary | ICD-10-CM

## 2015-04-11 DIAGNOSIS — D696 Thrombocytopenia, unspecified: Secondary | ICD-10-CM

## 2015-04-11 LAB — CBC WITH DIFFERENTIAL/PLATELET
BASOS PCT: 0 %
Basophils Absolute: 0 10*3/uL (ref 0–0.1)
EOS ABS: 0.7 10*3/uL (ref 0–0.7)
Eosinophils Relative: 7 %
HCT: 39.4 % — ABNORMAL LOW (ref 40.0–52.0)
Hemoglobin: 13 g/dL (ref 13.0–18.0)
Lymphocytes Relative: 12 %
Lymphs Abs: 1.3 10*3/uL (ref 1.0–3.6)
MCH: 28.9 pg (ref 26.0–34.0)
MCHC: 33 g/dL (ref 32.0–36.0)
MCV: 87.6 fL (ref 80.0–100.0)
MONO ABS: 0.9 10*3/uL (ref 0.2–1.0)
MONOS PCT: 8 %
NEUTROS PCT: 73 %
Neutro Abs: 8.1 10*3/uL — ABNORMAL HIGH (ref 1.4–6.5)
PLATELETS: 214 10*3/uL (ref 150–440)
RBC: 4.5 MIL/uL (ref 4.40–5.90)
RDW: 15.5 % — AB (ref 11.5–14.5)
WBC: 11.1 10*3/uL — ABNORMAL HIGH (ref 3.8–10.6)

## 2015-04-11 LAB — CREATININE, SERUM
CREATININE: 1.6 mg/dL — AB (ref 0.61–1.24)
GFR calc Af Amer: 48 mL/min — ABNORMAL LOW (ref >60)
GFR calc non Af Amer: 41 mL/min — ABNORMAL LOW (ref >60)

## 2015-04-11 LAB — CALCIUM: CALCIUM: 10.2 mg/dL (ref 8.9–10.3)

## 2015-04-11 MED ORDER — DENOSUMAB 120 MG/1.7ML ~~LOC~~ SOLN
120.0000 mg | Freq: Once | SUBCUTANEOUS | Status: AC
  Administered 2015-04-11: 120 mg via SUBCUTANEOUS
  Filled 2015-04-11: qty 1.7

## 2015-04-11 MED ORDER — LEUPROLIDE ACETATE (4 MONTH) 30 MG IM KIT
30.0000 mg | PACK | Freq: Once | INTRAMUSCULAR | Status: AC
  Administered 2015-04-11: 30 mg via INTRAMUSCULAR
  Filled 2015-04-11: qty 30

## 2015-04-11 NOTE — Progress Notes (Signed)
Patient states that he has been feeling very good and offers no complaints today. He states that he has not had any pain in his back for about two weeks now.

## 2015-04-12 ENCOUNTER — Telehealth: Payer: Self-pay | Admitting: *Deleted

## 2015-04-12 NOTE — Telephone Encounter (Signed)
Patient requesting someone call him regarding his appt schedule

## 2015-04-12 NOTE — Telephone Encounter (Signed)
The appts for [facility] he understands it is the appt at urology that he has for 8/29 he is unsure of and when I called their office that was the original appt that pt was given but it got moved up so therefore the 8/29 was cancelled because he was seen 8/11.  Called pt and let him know.

## 2015-04-18 ENCOUNTER — Ambulatory Visit: Payer: Self-pay

## 2015-04-27 NOTE — Progress Notes (Signed)
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 Radiology looked into CT-guided pelvic lymph node biopsy on 04/06/15 but could not be done since it was felt not accessible safely). (Old records not available, patient reports limited details, reportedly diagnosed with prostate cancer in the 1990s and underwent prostatectomy, subsequent PSA reportedly undetectable but did not follow-up after 2 years post surgery).  -  Patient starting on [facility] agonist therapy today along with Xgeva.   HISTORY OF PRESENT ILLNESS:  Patient returns for continued oncology follow-up. Overall states that he is doing about the same. Low back pain is under better control, states that he is not using pain medication at this time. Denies any dysuria or hematuria. Appetite is fairly steady. No other new bone pains. No nausea or vomiting. No new cough, no dyspnea chest pain or hemoptysis. No new headaches imbalance or falls.  REVIEW OF SYSTEMS:   ROS As in HPI above. In addition, no fever, chills. No new headaches or focal weakness.  No earache or discharge. No sore throat, cough, shortness of breath, sputum, hemoptysis or chest pain. No abdominal pain, constipation, diarrhea, dysuria or hematuria. No new skin rash or bleeding symptoms. No new paresthesias in extremities. PS ECOG 1.  PAST MEDICAL HISTORY: Reviewed. Past Medical History  Diagnosis Date  .  Diabetes mellitus without complication   . Hypertension   . Hypercholesteremia   . Cancer   . GERD (gastroesophageal reflux disease)   Records not available, patient reports limited details, reportedly diagnosed with prostate cancer in the 1990s and underwent prostatectomy, subsequent PSA reportedly undetectable but did not follow-up after 2 years post surgery.  PAST SURGICAL HISTORY: Reviewed. Past Surgical History  Procedure Laterality Date  . Prostatectomy    . Colon resection      FAMILY HISTORY: Reviewed. Remarkable for breast cancer, brain tumor. Denies prostate cancer.  SOCIAL HISTORY: Reviewed. Social History  Substance Use Topics  . Smoking status: Former Smoker -- 1.50 packs/day for 25 years    Types: Cigarettes    Quit date: 01/21/1974  . Smokeless tobacco: Never Used  . Alcohol Use: No    No Known Allergies  Current Outpatient Prescriptions  Medication Sig Dispense Refill  . allopurinol (ZYLOPRIM) 300 MG tablet Take 300 mg by mouth daily.    Marland Kitchen aspirin 81 MG chewable tablet Chew 1 tablet (81 mg total) by mouth daily. 30 tablet 2  . bicalutamide (CASODEX) 50 MG tablet Take 1 tablet (50 mg total) by mouth daily. 14 tablet 0  . carvedilol (COREG) 6.25 MG tablet Take 1 tablet (6.25 mg total) by mouth 2 (two) times daily with a meal. 60 tablet 2  . lovastatin (MEVACOR) 20 MG tablet Take 20 mg by mouth daily.    Marland Kitchen oxybutynin (DITROPAN) 5 MG tablet Take 5 mg by mouth 4 (four) times daily.    . pantoprazole (PROTONIX) 40 MG tablet Take 1 tablet (40 mg total) by mouth daily before breakfast. 30 tablet 2  . ranitidine (  ZANTAC) 150 MG tablet Take 150 mg by mouth 2 (two) times daily.    . verapamil (CALAN-SR) 240 MG CR tablet Take 1 tablet (240 mg total) by mouth daily. 30 tablet 2   No current facility-administered medications for this visit.    PHYSICAL EXAM: Filed Vitals:   04/11/15 1348  BP: 147/76  Pulse: 64  Temp: 95.8 F (35.4 C)  Resp: 18     Body mass index  is 28.58 kg/(m^2).    ECOG FS:1 - Symptomatic but completely ambulatory  GENERAL: Patient is alert and oriented and in no acute distress. There is no icterus. HEENT: EOMs intact.  No cervical lymphadenopathy. CVS: S1S2, regular LUNGS: Bilaterally clear to auscultation, no rhonchi. ABDOMEN: Soft, nontender. No hepatomegaly clinically.  EXTREMITIES: No pedal edema.   LAB RESULTS:    Component Value Date/Time   NA 134* 03/24/2015 1245   K 4.2 03/24/2015 1245   CL 104 03/24/2015 1245   CO2 24 03/24/2015 1245   GLUCOSE 139* 03/24/2015 1245   BUN 27* 03/24/2015 1245   CREATININE 1.60* 04/11/2015 1251   CALCIUM 10.2 04/11/2015 1251   PROT 6.8 03/24/2015 1245   ALBUMIN 3.6 03/24/2015 1245   AST 16 03/24/2015 1245   ALT 12* 03/24/2015 1245   ALKPHOS 81 03/24/2015 1245   BILITOT 0.4 03/24/2015 1245   GFRNONAA 41* 04/11/2015 1251   GFRAA 48* 04/11/2015 1251   Lab Results  Component Value Date   WBC 11.1* 04/11/2015   NEUTROABS 8.1* 04/11/2015   HGB 13.0 04/11/2015   HCT 39.4* 04/11/2015   MCV 87.6 04/11/2015   PLT 214 04/11/2015   03/31/15 - serum PSA 48.3.   STUDIES: 03/08/15 - CT scan of abdomen/pelvis.  IMPRESSION: Prostatic bed mass with probable secondary compression or postsurgical adhesion producing moderate to severe left hydroureteronephrosis. No clear fat plane is visible between this mass and the anterior aspect of the rectum, raising the question of rectal invasion. Primary exophytic rectal adenocarcinoma is felt less likely.  Retroperitoneal and left external iliac chain lymphadenopathy compatible with metastatic disease. L4 osseous metastatic disease with interval sclerosis. 4 mm left upper lobe pulmonary parenchymal nodule, amenable to presumed followup that feature for staining exams.  03/10/15 - Bone Scan.  IMPRESSION: 1. Increased activity noted over the left renal collecting system and ureter consistent with no hydronephrosis and hydroureter.  2. Intense increased  activity noted over the L4 vertebral body consist with known metastatic disease. Faint punctate area of increased activity noted over the right portion of the L2 vertebral body. This could represent a tiny metastatic focus.  3. Focal area of increased activity noted a right posterior rib consistent with a site of prior fracture.  4. Punctate focal air of increased activity noted over a posterior left rib. This could be related to prior fracture or a tiny metastatic focus.   Ct Pelvis Wo Contrast  04/06/2015   CLINICAL DATA:  Recurrent metastatic prostate carcinoma with enlarged pelvic lymph nodes. The patient presents for CT prior to attempt to try to biopsy one of the pelvic lymph nodes.  EXAM: CT PELVIS WITHOUT CONTRAST  TECHNIQUE: Multidetector CT imaging of the pelvis was performed following the standard protocol without intravenous contrast.  COMPARISON:  CT of the abdomen and pelvis dated 03/08/2015  FINDINGS: CT studies through the pelvis were performed in supine, right oblique and left oblique positions. Imaging shows interval enlargement of several pelvic lymph nodes. A proximal right external iliac chain lymph node measures 11 mm  in short axis compared to 9 mm on the prior study. A mid right external iliac lymph node measures 12 mm in short axis compared to 10 mm previously. A high presacral lymph node measures 12 mm in short axis compared to 10 mm. An additional presacral lymph node measures 10 mm in short axis compared to 9 mm previously. There remains evidence of left hydroureter.  In reviewing all of the imaging for potential biopsy of a right-sided external iliac lymph node, encroachment by the cecum and multiple small bowel loops did not allow a safe window to perform percutaneous biopsy. The presacral lymph nodes are also shielded by anterior bowel loops and bone posteriorly.  IMPRESSION: 1. A safe percutaneous window was not available under CT guidance to allow biopsy of either of the enlarged  external iliac lymph nodes today. The enlarged presacral lymph nodes are also not accessible percutaneously. 2. All of the index lymph nodes in the right external iliac chain and presacral region appear larger based on short axis measurements compared to prior imaging on 03/08/2015. Please see measurements above. 3. PET scan could be considered for further evaluation. If these lymph nodes are hot on PET scan, they are undoubtedly involved with metastatic prostate carcinoma given the recent history of prostate carcinoma recurrence with metastatic involvement of the lumbar spine.   Electronically Signed   By: Aletta Edouard M.D.   On: 04/06/2015 10:28     ASSESSMENT / PLAN:   1. 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 Radiology looked into CT-guided pelvic lymph node biopsy on 04/06/15 but could not be done since it was felt not accessible safely). (Old records not available, patient reports limited details, reportedly diagnosed with prostate cancer in the 1990s and underwent prostatectomy, subsequent PSA reportedly undetectable but did not follow-up after 2 years post surgery).  -  Reviewed recent labs, CT scan and bone scan and discussed with patient. Have explained that radiology was unable to find accessible pelvic lymph node for biopsy and they also did not feel that lumbar vertebral lesion was biopsy able. Given rising PSA and above radiological findings, along with history of prostate cancer, have explained that this most likely is metastatic recurrent prostate cancer. Patient has taken bicalutamide 2 weeks now, we will start him on [facility] agonist therapy for antigen  deprivation with Lupron 30 mg IM today given once every 16 weeks. Patient was explained about palliative intent of treatment since stage IV cancer overall is incurable, possible response rates and possible side effects of Lupron, he is agreeable to take this treatment and expressed verbal consent.  2. Back Pain - continue pain medication when necessary, being evaluated for palliative radiation. 3. Awaiting urology evaluation for left hydronephrosis on CT scan. Serum creatinine 1.60, patient advised to maintain adequate oral fluid intake, continue to monitor intermittently. 4. Bone metastasis - start Xgeva 120 mg subcutaneous every 4 weeks for Bone Directed Therapy. Monitor calcium prior to each dose.     5. In between visits, the patient has been advised to call or come to the ER in case of fevers, bleeding, acute sickness or new symptoms. He is agreeable to this plan.   Leia Alf, MD   04/27/2015 8:48  AM

## 2015-05-04 ENCOUNTER — Encounter: Payer: Self-pay | Admitting: Radiation Oncology

## 2015-05-04 ENCOUNTER — Ambulatory Visit
Admission: RE | Admit: 2015-05-04 | Discharge: 2015-05-04 | Disposition: A | Discharge status: Home / Self Care | Payer: Medicare Other | Source: Ambulatory Visit | Attending: Radiation Oncology | Admitting: Radiation Oncology

## 2015-05-04 VITALS — BP 148/74 | HR 53 | Temp 96.5°F | Resp 18 | Wt 208.2 lb

## 2015-05-04 DIAGNOSIS — C7951 Secondary malignant neoplasm of bone: Principal | ICD-10-CM

## 2015-05-04 DIAGNOSIS — C61 Malignant neoplasm of prostate: Secondary | ICD-10-CM

## 2015-05-04 NOTE — Progress Notes (Signed)
Radiation Oncology Follow up Note  Name: [name]   Date:   05/04/2015 MRN:  [id number] DOB: 10/21/1940    This 74 y.o. male presents to the clinic today for follow-up of stage IV metastatic prostate cancer.  REFERRING PROVIDER: Tamsen Roers, MD  HPI: Patient is a 74 year old male now out 1 month having completed radiation therapy to his lumbar spine for stage IV involvement of metastatic prostate cancer. He is now seen 1 month out as had a complete response as far as pain and concern. He's having no pain at this time. Specifically denies any diarrhea or any difficulty ambulating..  COMPLICATIONS OF TREATMENT: none  FOLLOW UP COMPLIANCE: keeps appointments   PHYSICAL EXAM:  BP 148/74 mmHg  Pulse 53  Temp(Src) 96.5 F (35.8 C)  Resp 18  Wt 208 lb 3.6 oz (94.45 kg) Deep palpation of his lumbar spine does not elicit pain. Motor sensory and DTR levels are equal and symmetric in the lower extremity is bilaterally. Range of motion of his lower extremities does not elicit pain. Well-developed well-nourished patient in NAD. HEENT reveals PERLA, EOMI, discs not visualized.  Oral cavity is clear. No oral mucosal lesions are identified. Neck is clear without evidence of cervical or supraclavicular adenopathy. Lungs are clear to A&P. Cardiac examination is essentially unremarkable with regular rate and rhythm without murmur rub or thrill. Abdomen is benign with no organomegaly or masses noted. Motor sensory and DTR levels are equal and symmetric in the upper and lower extremities. Cranial nerves II through XII are grossly intact. Proprioception is intact. No peripheral adenopathy or edema is identified. No motor or sensory levels are noted. Crude visual fields are within normal range.   RADIOLOGY RESULTS: No current films to review  PLAN: At the present time he is doing well with excellent palliative benefit to the radiation therapy. I'm turning follow-up care over to medical oncology. We'll be  happy to reevaluate the patient at any time should further palliative treatment be indicated.  I would like to take this opportunity for allowing me to participate in the care of your patient.Armstead Peaks., MD

## 2015-05-09 ENCOUNTER — Inpatient Hospital Stay: Payer: Medicare Other

## 2015-05-09 ENCOUNTER — Inpatient Hospital Stay: Payer: Medicare Other | Attending: Internal Medicine

## 2015-05-09 DIAGNOSIS — Z79899 Other long term (current) drug therapy: Secondary | ICD-10-CM | POA: Diagnosis not present

## 2015-05-09 DIAGNOSIS — C61 Malignant neoplasm of prostate: Secondary | ICD-10-CM | POA: Insufficient documentation

## 2015-05-09 DIAGNOSIS — C7951 Secondary malignant neoplasm of bone: Secondary | ICD-10-CM | POA: Diagnosis not present

## 2015-05-09 LAB — CALCIUM: CALCIUM: 9.6 mg/dL (ref 8.9–10.3)

## 2015-05-09 MED ORDER — DENOSUMAB 120 MG/1.7ML ~~LOC~~ SOLN
120.0000 mg | Freq: Once | SUBCUTANEOUS | Status: AC
  Administered 2015-05-09: 120 mg via SUBCUTANEOUS
  Filled 2015-05-09: qty 1.7

## 2015-05-10 ENCOUNTER — Ambulatory Visit (INDEPENDENT_AMBULATORY_CARE_PROVIDER_SITE_OTHER): Payer: Medicare Other | Admitting: Urology

## 2015-05-10 ENCOUNTER — Encounter: Payer: Self-pay | Admitting: Urology

## 2015-05-10 VITALS — BP 144/76 | HR 64 | Ht 71.0 in | Wt 209.9 lb

## 2015-05-10 DIAGNOSIS — N1330 Unspecified hydronephrosis: Secondary | ICD-10-CM

## 2015-05-10 DIAGNOSIS — C61 Malignant neoplasm of prostate: Secondary | ICD-10-CM

## 2015-05-10 LAB — URINALYSIS, COMPLETE
Bilirubin, UA: NEGATIVE
Glucose, UA: NEGATIVE
KETONES UA: NEGATIVE
LEUKOCYTES UA: NEGATIVE
Nitrite, UA: NEGATIVE
SPEC GRAV UA: 1.02 (ref 1.005–1.030)
Urobilinogen, Ur: 0.2 mg/dL (ref 0.2–1.0)
pH, UA: 5.5 (ref 5.0–7.5)

## 2015-05-10 LAB — MICROSCOPIC EXAMINATION: Bacteria, UA: NONE SEEN

## 2015-05-10 NOTE — Progress Notes (Signed)
05/10/2015 1:46 PM   [name] [birth year] [id number]  Referring Aleshka Corney: Tamsen Roers, MD [address], [place] [address]  Chief Complaint  Patient presents with  . Hydronephrosis    discuss surgery    HPI: The patient is a 74 year old gentleman with a known past medical history of stage IV metastatic prostate cancer. He presents today with left hydroureteronephrosis. This was seen on both the recent CT scan and bone scan. He also has what appears to be a mass in his prostatics bed and possibly growing into his bladder. He had a radical prostatectomy in 1992. We do not have records from this procedure. The patient does not remember where this was done. He saw Dr. Elnoria Howard a few weeks ago and noted a large firm mass at the base the bladder. He now has what appears to be this newfound left hydronephrosis. His creatinine is 1.6. He sees Dr. Leia Alf recently started him on Lupron. His most recent PSA prior to Lupron was 48.31 month ago.  PMH: Past Medical History  Diagnosis Date  . Diabetes mellitus without complication   . Hypertension   . Hypercholesteremia   . Cancer   . GERD (gastroesophageal reflux disease)   . Prostate cancer   . Arthritis     Surgical History: Past Surgical History  Procedure Laterality Date  . Prostatectomy  1994  . Colon resection      Home Medications:    Medication List       This list is accurate as of: 05/10/15  1:46 PM.  Always use your most recent med list.               allopurinol 300 MG tablet  Commonly known as:  ZYLOPRIM  Take 300 mg by mouth daily.     aspirin 81 MG chewable tablet  Chew 1 tablet (81 mg total) by mouth daily.     bicalutamide 50 MG tablet  Commonly known as:  CASODEX  Take 1 tablet (50 mg total) by mouth daily.     carvedilol 6.25 MG tablet  Commonly known as:  COREG  Take 1 tablet (6.25 mg total) by mouth 2 (two) times daily with a meal.     lovastatin 20 MG tablet  Commonly known as:   MEVACOR  Take 20 mg by mouth daily.     oxybutynin 5 MG tablet  Commonly known as:  DITROPAN  Take 5 mg by mouth 4 (four) times daily.     pantoprazole 40 MG tablet  Commonly known as:  PROTONIX  Take 1 tablet (40 mg total) by mouth daily before breakfast.     ranitidine 150 MG tablet  Commonly known as:  ZANTAC  Take 150 mg by mouth 2 (two) times daily.     verapamil 240 MG CR tablet  Commonly known as:  CALAN-SR  Take 1 tablet (240 mg total) by mouth daily.        Allergies: No Known Allergies  Family History: Family History  Problem Relation Age of Onset  . Aneurysm Father   . Cancer Mother     Social History:  reports that he quit smoking about 41 years ago. His smoking use included Cigarettes. He has a 37.5 pack-year smoking history. He has never used smokeless tobacco. He reports that he does not drink alcohol or use illicit drugs.  ROS: UROLOGY Frequent Urination?: Yes Hard to postpone urination?: No Burning/pain with urination?: No Get up at night to  urinate?: Yes Leakage of urine?: Yes Urine stream starts and stops?: No Trouble starting stream?: No Do you have to strain to urinate?: No Blood in urine?: No Urinary tract infection?: No Sexually transmitted disease?: No Injury to kidneys or bladder?: No Painful intercourse?: No Weak stream?: Yes Erection problems?: No Penile pain?: No  Gastrointestinal Nausea?: No Vomiting?: No Indigestion/heartburn?: No Diarrhea?: No Constipation?: Yes  Constitutional Fever: No Night sweats?: No Weight loss?: No Fatigue?: No  Skin Skin rash/lesions?: No Itching?: No  Eyes Blurred vision?: No Double vision?: No  Ears/Nose/Throat Sore throat?: No Sinus problems?: No  Hematologic/Lymphatic Swollen glands?: No Easy bruising?: No  Cardiovascular Leg swelling?: No Chest pain?: No  Respiratory Cough?: No Shortness of breath?: No  Endocrine Excessive thirst?: No  Musculoskeletal Back pain?:  No Joint pain?: No  Neurological Headaches?: No Dizziness?: No  Psychologic Depression?: No Anxiety?: No  Physical Exam: BP 144/76 mmHg  Pulse 64  Ht 5\' 11"  (1.803 m)  Wt 209 lb 14.4 oz (95.21 kg)  BMI 29.29 kg/m2  Constitutional:  Alert and oriented, No acute distress. HEENT: [place] AT, moist mucus membranes.  Trachea midline, no masses. Cardiovascular: No clubbing, cyanosis, or edema. Respiratory: Normal respiratory effort, no increased work of breathing. GI: Abdomen is soft, nontender, nondistended, no abdominal masses GU: No CVA tenderness.  Skin: No rashes, bruises or suspicious lesions. Lymph: No cervical or inguinal adenopathy. Neurologic: Grossly intact, no focal deficits, moving all 4 extremities. Psychiatric: Normal mood and affect.  Laboratory Data: Lab Results  Component Value Date   WBC 11.1* 04/11/2015   HGB 13.0 04/11/2015   HCT 39.4* 04/11/2015   MCV 87.6 04/11/2015   PLT 214 04/11/2015    Lab Results  Component Value Date   CREATININE 1.60* 04/11/2015    Lab Results  Component Value Date   PSA 48.3* 03/31/2015   PSA 48.22* 03/24/2015    Lab Results  Component Value Date   TESTOSTERONE 228* 03/24/2015    Lab Results  Component Value Date   HGBA1C 5.5 01/30/2014    Urinalysis    Component Value Date/Time   COLORURINE YELLOW 01/13/2014 2146   APPEARANCEUR CLOUDY* 01/13/2014 2146   LABSPEC 1.017 01/13/2014 2146   PHURINE 5.0 01/13/2014 2146   GLUCOSEU Negative 03/31/2015 1240   HGBUR LARGE* 01/13/2014 2146   BILIRUBINUR Negative 03/31/2015 1240   BILIRUBINUR NEGATIVE 01/13/2014 2146   [place] 01/13/2014 2146   PROTEINUR 100* 01/13/2014 2146   UROBILINOGEN 0.2 01/13/2014 2146   NITRITE Negative 03/31/2015 1240   NITRITE NEGATIVE 01/13/2014 2146   LEUKOCYTESUR 3+* 03/31/2015 1240   LEUKOCYTESUR NEGATIVE 01/13/2014 2146    Pertinent Imaging: IMIMPRESSION: Prostatic bed mass with probable secondary compression  or postsurgical adhesion producing moderate to severe left hydroureteronephrosis.  No clear fat plane is visible between this mass and the anterior aspect of the rectum, raising the question of rectal invasion. Primary exophytic rectal adenocarcinoma is felt less likely.  Retroperitoneal and left external iliac chain lymphadenopathy compatible with metastatic disease.  L4 osseous metastatic disease with interval sclerosis.  4 mm left upper lobe pulmonary parenchymal nodule, amenable to presumed followup that feature for staining exams.   L4 osseous metastatic disease with interval sclerosis.  4 mm left upper lobe pulmonary parenchymal nodule, amenable to presumed followup that feature for staining exams.   Assessment & Plan:    1. Left Hydroureteronephrosis I discussed with the patient that his severe hydroureteronephrosis needs further evaluation. We discussed that it is likely secondary to  local recurrence of prostate cancer. We discussed him undergoing a cystoscopy, left retrograde pyelogram, and left ureteral stent placement. He was made aware that he will likely need a chronic ureteral stents which would obligate him to stent changes every 3 months. He is also aware that there is a fairly high chance that a retrograde approach may be unsuccessful due to his cancer. In this event, he would need to see interventional radiology for antegrade ureteral stent placement. He is agreeable to this procedure and will be scheduled for in the near future. - Urinalysis, Complete - CULTURE, URINE COMPREHENSIVE   2. Metastatic prostate cancer - Stage IV with metastases to lumbar spine, pelvic lymphadenopathy  He is artery under the care of Dr. Ma Hillock in on Lupron and [facility] for bone mets. We will defer further management to medical oncology.   Follow Up: In OR for left ureteral stent placement  Nickie Retort, [place] [address], [place], [place] [address] [phone]

## 2015-05-12 LAB — CULTURE, URINE COMPREHENSIVE

## 2015-05-17 ENCOUNTER — Telehealth: Payer: Self-pay | Admitting: Radiology

## 2015-05-17 NOTE — Telephone Encounter (Signed)
Pt notified of surgery scheduled 06/08/15, pre-admit testing appt 05/31/15 at 8:15 and to call day prior to surgery for arrival time to SDS. Pt advised to be NPO after mn day of surgery. Pt verbalizes understanding.

## 2015-05-18 ENCOUNTER — Telehealth: Payer: Self-pay | Admitting: Radiology

## 2015-05-18 NOTE — Telephone Encounter (Signed)
Notified pt that per Dr Pilar Jarvis he does not need to hold ASA 81mg  prior to surgery. Reiterated that surgery is scheduled 06/08/15, pre-admit testing appt on 05/31/15 @8 :15 and to call day before surgery for arrival time. Pt verbalizes understanding.

## 2015-05-28 NOTE — Progress Notes (Signed)
This encounter was created in error - please disregard.

## 2015-05-31 ENCOUNTER — Encounter
Admission: RE | Admit: 2015-05-31 | Discharge: 2015-05-31 | Disposition: A | Discharge status: Home / Self Care | Payer: Medicare Other | Source: Ambulatory Visit | Attending: Urology | Admitting: Urology

## 2015-05-31 DIAGNOSIS — Z01818 Encounter for other preprocedural examination: Secondary | ICD-10-CM | POA: Diagnosis not present

## 2015-05-31 DIAGNOSIS — N1330 Unspecified hydronephrosis: Secondary | ICD-10-CM | POA: Diagnosis not present

## 2015-05-31 DIAGNOSIS — C61 Malignant neoplasm of prostate: Secondary | ICD-10-CM | POA: Diagnosis not present

## 2015-05-31 LAB — BASIC METABOLIC PANEL
ANION GAP: 6 (ref 5–15)
BUN: 30 mg/dL — ABNORMAL HIGH (ref 6–20)
CHLORIDE: 109 mmol/L (ref 101–111)
CO2: 25 mmol/L (ref 22–32)
Calcium: 9.9 mg/dL (ref 8.9–10.3)
Creatinine, Ser: 1.45 mg/dL — ABNORMAL HIGH (ref 0.61–1.24)
GFR calc non Af Amer: 46 mL/min — ABNORMAL LOW (ref >60)
GFR, EST AFRICAN AMERICAN: 54 mL/min — AB (ref >60)
GLUCOSE: 148 mg/dL — AB (ref 65–99)
POTASSIUM: 4.3 mmol/L (ref 3.5–5.1)
Sodium: 140 mmol/L (ref 135–145)

## 2015-05-31 LAB — CBC
HEMATOCRIT: 39.8 % — AB (ref 40.0–52.0)
HEMOGLOBIN: 13.1 g/dL (ref 13.0–18.0)
MCH: 29 pg (ref 26.0–34.0)
MCHC: 32.9 g/dL (ref 32.0–36.0)
MCV: 88.1 fL (ref 80.0–100.0)
Platelets: 178 10*3/uL (ref 150–440)
RBC: 4.51 MIL/uL (ref 4.40–5.90)
RDW: 16.2 % — ABNORMAL HIGH (ref 11.5–14.5)
WBC: 8.6 10*3/uL (ref 3.8–10.6)

## 2015-05-31 NOTE — Patient Instructions (Signed)
  Your procedure is scheduled on: 06/08/15 Report to Day Surgery. To find out your arrival time please call [phone] between 1PM - 3PM on 06/07/15.  Remember: Instructions that are not followed completely may result in serious medical risk, up to and including death, or upon the discretion of your surgeon and anesthesiologist your surgery may need to be rescheduled.    __x__ 1. Do not eat food or drink liquids after midnight. No gum chewing or hard candies.     _x___ 2. No Alcohol for 24 hours before or after surgery.   ____ 3. Bring all medications with you on the day of surgery if instructed.    __x__ 4. Notify your doctor if there is any change in your medical condition     (cold, fever, infections).     Do not wear jewelry, make-up, hairpins, clips or nail polish.  Do not wear lotions, powders, or perfumes. You may wear deodorant.  Do not shave 48 hours prior to surgery. Men may shave face and neck.  Do not bring valuables to the hospital.    [facility] is not responsible for any belongings or valuables.               Contacts, dentures or bridgework may not be worn into surgery.  Leave your suitcase in the car. After surgery it may be brought to your room.  For patients admitted to the hospital, discharge time is determined by your                treatment team.   Patients discharged the day of surgery will not be allowed to drive home.   Please read over the following fact sheets that you were given:   Surgical Site Infection Prevention   ____ Take these medicines the morning of surgery with A SIP OF WATER:    1. carvedilol  2. oxybutynin  3.protonix   4.zantac  5.  6.  ____ Fleet Enema (as directed)   ____ Use CHG Soap as directed  ____ Use inhalers on the day of surgery  ____ Stop metformin 2 days prior to surgery    ____ Take 1/2 of usual insulin dose the night before surgery and none on the morning of surgery.   __x__ Stop Coumadin/Plavix/aspirin on   Stopping aspirin now ____ Stop Anti-inflammatories on   ____ Stop supplements until after surgery.    ____ Bring C-Pap to the hospital.

## 2015-06-06 ENCOUNTER — Inpatient Hospital Stay: Payer: Medicare Other

## 2015-06-06 ENCOUNTER — Inpatient Hospital Stay: Payer: Medicare Other | Attending: Internal Medicine

## 2015-06-06 VITALS — BP 170/82 | HR 66 | Temp 96.3°F

## 2015-06-06 DIAGNOSIS — C7951 Secondary malignant neoplasm of bone: Secondary | ICD-10-CM | POA: Insufficient documentation

## 2015-06-06 DIAGNOSIS — C61 Malignant neoplasm of prostate: Secondary | ICD-10-CM | POA: Diagnosis present

## 2015-06-06 DIAGNOSIS — Z79899 Other long term (current) drug therapy: Secondary | ICD-10-CM | POA: Insufficient documentation

## 2015-06-06 LAB — CALCIUM: CALCIUM: 10 mg/dL (ref 8.9–10.3)

## 2015-06-06 MED ORDER — DENOSUMAB 120 MG/1.7ML ~~LOC~~ SOLN
120.0000 mg | Freq: Once | SUBCUTANEOUS | Status: AC
  Administered 2015-06-06: 120 mg via SUBCUTANEOUS
  Filled 2015-06-06: qty 1.7

## 2015-06-08 ENCOUNTER — Encounter
Admission: RE | Disposition: A | Discharge status: Home / Self Care | Payer: Self-pay | Source: Ambulatory Visit | Attending: Urology

## 2015-06-08 ENCOUNTER — Ambulatory Visit: Payer: Medicare Other | Admitting: Anesthesiology

## 2015-06-08 ENCOUNTER — Telehealth: Payer: Self-pay

## 2015-06-08 ENCOUNTER — Encounter: Payer: Self-pay | Admitting: *Deleted

## 2015-06-08 ENCOUNTER — Ambulatory Visit
Admission: RE | Admit: 2015-06-08 | Discharge: 2015-06-08 | Disposition: A | Discharge status: Home / Self Care | Payer: Medicare Other | Source: Ambulatory Visit | Attending: Urology | Admitting: Urology

## 2015-06-08 DIAGNOSIS — Z7982 Long term (current) use of aspirin: Secondary | ICD-10-CM | POA: Diagnosis not present

## 2015-06-08 DIAGNOSIS — C61 Malignant neoplasm of prostate: Secondary | ICD-10-CM | POA: Diagnosis not present

## 2015-06-08 DIAGNOSIS — Z87891 Personal history of nicotine dependence: Secondary | ICD-10-CM | POA: Diagnosis not present

## 2015-06-08 DIAGNOSIS — K219 Gastro-esophageal reflux disease without esophagitis: Secondary | ICD-10-CM | POA: Diagnosis not present

## 2015-06-08 DIAGNOSIS — C7951 Secondary malignant neoplasm of bone: Secondary | ICD-10-CM | POA: Insufficient documentation

## 2015-06-08 DIAGNOSIS — I252 Old myocardial infarction: Secondary | ICD-10-CM | POA: Diagnosis not present

## 2015-06-08 DIAGNOSIS — N132 Hydronephrosis with renal and ureteral calculous obstruction: Secondary | ICD-10-CM

## 2015-06-08 DIAGNOSIS — Z8249 Family history of ischemic heart disease and other diseases of the circulatory system: Secondary | ICD-10-CM | POA: Diagnosis not present

## 2015-06-08 DIAGNOSIS — N138 Other obstructive and reflux uropathy: Secondary | ICD-10-CM | POA: Diagnosis not present

## 2015-06-08 DIAGNOSIS — I10 Essential (primary) hypertension: Secondary | ICD-10-CM | POA: Diagnosis not present

## 2015-06-08 DIAGNOSIS — Z5309 Procedure and treatment not carried out because of other contraindication: Secondary | ICD-10-CM | POA: Insufficient documentation

## 2015-06-08 DIAGNOSIS — Z809 Family history of malignant neoplasm, unspecified: Secondary | ICD-10-CM | POA: Diagnosis not present

## 2015-06-08 DIAGNOSIS — M1990 Unspecified osteoarthritis, unspecified site: Secondary | ICD-10-CM | POA: Diagnosis not present

## 2015-06-08 DIAGNOSIS — D696 Thrombocytopenia, unspecified: Secondary | ICD-10-CM | POA: Diagnosis not present

## 2015-06-08 DIAGNOSIS — N359 Urethral stricture, unspecified: Secondary | ICD-10-CM | POA: Diagnosis not present

## 2015-06-08 DIAGNOSIS — I509 Heart failure, unspecified: Secondary | ICD-10-CM | POA: Diagnosis not present

## 2015-06-08 DIAGNOSIS — N131 Hydronephrosis with ureteral stricture, not elsewhere classified: Secondary | ICD-10-CM

## 2015-06-08 DIAGNOSIS — N1330 Unspecified hydronephrosis: Secondary | ICD-10-CM | POA: Diagnosis not present

## 2015-06-08 DIAGNOSIS — E7800 Pure hypercholesterolemia, unspecified: Secondary | ICD-10-CM | POA: Insufficient documentation

## 2015-06-08 DIAGNOSIS — E119 Type 2 diabetes mellitus without complications: Secondary | ICD-10-CM | POA: Insufficient documentation

## 2015-06-08 DIAGNOSIS — Z79899 Other long term (current) drug therapy: Secondary | ICD-10-CM | POA: Insufficient documentation

## 2015-06-08 HISTORY — PX: CYSTOSCOPY W/ RETROGRADES: SHX1426

## 2015-06-08 LAB — GLUCOSE, CAPILLARY
GLUCOSE-CAPILLARY: 134 mg/dL — AB (ref 65–99)
Glucose-Capillary: 137 mg/dL — ABNORMAL HIGH (ref 65–99)

## 2015-06-08 SURGERY — CYSTOSCOPY, WITH RETROGRADE PYELOGRAM
Anesthesia: General | Laterality: Left | Wound class: Clean Contaminated

## 2015-06-08 MED ORDER — SODIUM CHLORIDE 0.9 % IV SOLN
INTRAVENOUS | Status: DC
  Administered 2015-06-08: 08:00:00 via INTRAVENOUS

## 2015-06-08 MED ORDER — DEXAMETHASONE SODIUM PHOSPHATE 4 MG/ML IJ SOLN
INTRAMUSCULAR | Status: DC | PRN
  Administered 2015-06-08: 10 mg via INTRAVENOUS

## 2015-06-08 MED ORDER — FENTANYL CITRATE (PF) 100 MCG/2ML IJ SOLN: 25.0000 ug | INTRAMUSCULAR | Status: DC | PRN

## 2015-06-08 MED ORDER — EPHEDRINE SULFATE 50 MG/ML IJ SOLN
INTRAMUSCULAR | Status: DC | PRN
  Administered 2015-06-08: 10 mg via INTRAVENOUS

## 2015-06-08 MED ORDER — CEFAZOLIN SODIUM-DEXTROSE 2-3 GM-% IV SOLR
INTRAVENOUS | Status: AC
Start: 2015-06-08 — End: 2015-06-08
  Administered 2015-06-08: 2 g via INTRAVENOUS
  Filled 2015-06-08: qty 50

## 2015-06-08 MED ORDER — ONDANSETRON HCL 4 MG/2ML IJ SOLN
INTRAMUSCULAR | Status: DC | PRN
  Administered 2015-06-08: 4 mg via INTRAVENOUS

## 2015-06-08 MED ORDER — PHENYLEPHRINE HCL 10 MG/ML IJ SOLN
INTRAMUSCULAR | Status: DC | PRN
  Administered 2015-06-08: 100 ug via INTRAVENOUS

## 2015-06-08 MED ORDER — ROCURONIUM BROMIDE 100 MG/10ML IV SOLN
INTRAVENOUS | Status: DC | PRN
  Administered 2015-06-08: 9.3 mg via INTRAVENOUS

## 2015-06-08 MED ORDER — CEFAZOLIN SODIUM 1-5 GM-% IV SOLN
INTRAVENOUS | Status: DC | PRN
  Administered 2015-06-08: 2 g via INTRAVENOUS

## 2015-06-08 MED ORDER — PROPOFOL 10 MG/ML IV BOLUS
INTRAVENOUS | Status: DC | PRN
  Administered 2015-06-08: 100 mg via INTRAVENOUS

## 2015-06-08 MED ORDER — CIPROFLOXACIN HCL 500 MG PO TABS: 500.0000 mg | ORAL_TABLET | Freq: Every day | ORAL | Status: DC

## 2015-06-08 MED ORDER — SUCCINYLCHOLINE CHLORIDE 20 MG/ML IJ SOLN
INTRAMUSCULAR | Status: DC | PRN
  Administered 2015-06-08: 100 mg via INTRAVENOUS

## 2015-06-08 MED ORDER — ONDANSETRON HCL 4 MG/2ML IJ SOLN: 4.0000 mg | Freq: Once | INTRAMUSCULAR | Status: DC | PRN

## 2015-06-08 MED ORDER — IOTHALAMATE MEGLUMINE 43 % IV SOLN
INTRAVENOUS | Status: DC | PRN
  Administered 2015-06-08: 12.5 mL

## 2015-06-08 MED ORDER — LACTATED RINGERS IV SOLN
INTRAVENOUS | Status: DC | PRN
  Administered 2015-06-08: 09:00:00 via INTRAVENOUS

## 2015-06-08 MED ORDER — LIDOCAINE HCL (CARDIAC) 20 MG/ML IV SOLN
INTRAVENOUS | Status: DC | PRN
  Administered 2015-06-08: 100 mg via INTRAVENOUS

## 2015-06-08 MED ORDER — HYDROCODONE-ACETAMINOPHEN 5-325 MG PO TABS: 1.0000 | ORAL_TABLET | Freq: Four times a day (QID) | ORAL | Status: DC | PRN

## 2015-06-08 MED ORDER — FENTANYL CITRATE (PF) 100 MCG/2ML IJ SOLN
INTRAMUSCULAR | Status: DC | PRN
  Administered 2015-06-08: 100 ug via INTRAVENOUS

## 2015-06-08 MED ORDER — CEFAZOLIN SODIUM-DEXTROSE 2-3 GM-% IV SOLR
2.0000 g | INTRAVENOUS | Status: AC
  Administered 2015-06-08: 2 g via INTRAVENOUS

## 2015-06-08 SURGICAL SUPPLY — 18 items
BACTOSHIELD CHG 4% 4OZ (MISCELLANEOUS) ×2
CATH URETL 5X70 OPEN END (CATHETERS) ×3 IMPLANT
CONRAY 43 FOR UROLOGY 50M (MISCELLANEOUS) ×3 IMPLANT
GLOVE BIO SURGEON STRL SZ7 (GLOVE) ×6 IMPLANT
GLOVE BIO SURGEON STRL SZ7.5 (GLOVE) ×3 IMPLANT
GOWN STRL REUS W/ TWL LRG LVL3 (GOWN DISPOSABLE) ×1 IMPLANT
GOWN STRL REUS W/ TWL LRG LVL4 (GOWN DISPOSABLE) ×1 IMPLANT
GOWN STRL REUS W/TWL LRG LVL3 (GOWN DISPOSABLE) ×3
GOWN STRL REUS W/TWL LRG LVL4 (GOWN DISPOSABLE) ×3
GOWN STRL REUS W/TWL XL LVL3 (GOWN DISPOSABLE) ×3 IMPLANT
KIT RM TURNOVER CYSTO AR (KITS) ×3 IMPLANT
PACK CYSTO AR (MISCELLANEOUS) ×3 IMPLANT
SCRUB CHG 4% DYNA-HEX 4OZ (MISCELLANEOUS) ×1 IMPLANT
SENSORWIRE 0.038 NOT ANGLED (WIRE) ×3
STENT URET 6FRX24 CONTOUR (STENTS) IMPLANT
STENT URET 6FRX26 CONTOUR (STENTS) IMPLANT
SURGILUBE 2OZ TUBE FLIPTOP (MISCELLANEOUS) ×3 IMPLANT
WIRE SENSOR 0.038 NOT ANGLED (WIRE) ×1 IMPLANT

## 2015-06-08 NOTE — H&P (View-Only) (Signed)
05/10/2015 1:46 PM   [name] 1941-08-19 [id number]  Referring provider: Tamsen Roers, MD [address], [place] [address]  Chief Complaint  Patient presents with  . Hydronephrosis    discuss surgery    HPI: The patient is a 74 year old gentleman with a known past medical history of stage IV metastatic prostate cancer. He presents today with left hydroureteronephrosis. This was seen on both the recent CT scan and bone scan. He also has what appears to be a mass in his prostatics bed and possibly growing into his bladder. He had a radical prostatectomy in 1992. We do not have records from this procedure. The patient does not remember where this was done. He saw Dr. Elnoria Howard a few weeks ago and noted a large firm mass at the base the bladder. He now has what appears to be this newfound left hydronephrosis. His creatinine is 1.6. He sees Dr. Leia Alf recently started him on Lupron. His most recent PSA prior to Lupron was 48.31 month ago.  PMH: Past Medical History  Diagnosis Date  . Diabetes mellitus without complication   . Hypertension   . Hypercholesteremia   . Cancer   . GERD (gastroesophageal reflux disease)   . Prostate cancer   . Arthritis     Surgical History: Past Surgical History  Procedure Laterality Date  . Prostatectomy  1994  . Colon resection      Home Medications:    Medication List       This list is accurate as of: 05/10/15  1:46 PM.  Always use your most recent med list.               allopurinol 300 MG tablet  Commonly known as:  ZYLOPRIM  Take 300 mg by mouth daily.     aspirin 81 MG chewable tablet  Chew 1 tablet (81 mg total) by mouth daily.     bicalutamide 50 MG tablet  Commonly known as:  CASODEX  Take 1 tablet (50 mg total) by mouth daily.     carvedilol 6.25 MG tablet  Commonly known as:  COREG  Take 1 tablet (6.25 mg total) by mouth 2 (two) times daily with a meal.     lovastatin 20 MG tablet  Commonly known as:   MEVACOR  Take 20 mg by mouth daily.     oxybutynin 5 MG tablet  Commonly known as:  DITROPAN  Take 5 mg by mouth 4 (four) times daily.     pantoprazole 40 MG tablet  Commonly known as:  PROTONIX  Take 1 tablet (40 mg total) by mouth daily before breakfast.     ranitidine 150 MG tablet  Commonly known as:  ZANTAC  Take 150 mg by mouth 2 (two) times daily.     verapamil 240 MG CR tablet  Commonly known as:  CALAN-SR  Take 1 tablet (240 mg total) by mouth daily.        Allergies: No Known Allergies  Family History: Family History  Problem Relation Age of Onset  . Aneurysm Father   . Cancer Mother     Social History:  reports that he quit smoking about 41 years ago. His smoking use included Cigarettes. He has a 37.5 pack-year smoking history. He has never used smokeless tobacco. He reports that he does not drink alcohol or use illicit drugs.  ROS: UROLOGY Frequent Urination?: Yes Hard to postpone urination?: No Burning/pain with urination?: No Get up at night to  urinate?: Yes Leakage of urine?: Yes Urine stream starts and stops?: No Trouble starting stream?: No Do you have to strain to urinate?: No Blood in urine?: No Urinary tract infection?: No Sexually transmitted disease?: No Injury to kidneys or bladder?: No Painful intercourse?: No Weak stream?: Yes Erection problems?: No Penile pain?: No  Gastrointestinal Nausea?: No Vomiting?: No Indigestion/heartburn?: No Diarrhea?: No Constipation?: Yes  Constitutional Fever: No Night sweats?: No Weight loss?: No Fatigue?: No  Skin Skin rash/lesions?: No Itching?: No  Eyes Blurred vision?: No Double vision?: No  Ears/Nose/Throat Sore throat?: No Sinus problems?: No  Hematologic/Lymphatic Swollen glands?: No Easy bruising?: No  Cardiovascular Leg swelling?: No Chest pain?: No  Respiratory Cough?: No Shortness of breath?: No  Endocrine Excessive thirst?: No  Musculoskeletal Back pain?:  No Joint pain?: No  Neurological Headaches?: No Dizziness?: No  Psychologic Depression?: No Anxiety?: No  Physical Exam: BP 144/76 mmHg  Pulse 64  Ht 5\' 11"  (1.803 m)  Wt 209 lb 14.4 oz (95.21 kg)  BMI 29.29 kg/m2  Constitutional:  Alert and oriented, No acute distress. HEENT: [place] AT, moist mucus membranes.  Trachea midline, no masses. Cardiovascular: No clubbing, cyanosis, or edema. Respiratory: Normal respiratory effort, no increased work of breathing. GI: Abdomen is soft, nontender, nondistended, no abdominal masses GU: No CVA tenderness.  Skin: No rashes, bruises or suspicious lesions. Lymph: No cervical or inguinal adenopathy. Neurologic: Grossly intact, no focal deficits, moving all 4 extremities. Psychiatric: Normal mood and affect.  Laboratory Data: Lab Results  Component Value Date   WBC 11.1* 04/11/2015   HGB 13.0 04/11/2015   HCT 39.4* 04/11/2015   MCV 87.6 04/11/2015   PLT 214 04/11/2015    Lab Results  Component Value Date   CREATININE 1.60* 04/11/2015    Lab Results  Component Value Date   PSA 48.3* 03/31/2015   PSA 48.22* 03/24/2015    Lab Results  Component Value Date   TESTOSTERONE 228* 03/24/2015    Lab Results  Component Value Date   HGBA1C 5.5 01/30/2014    Urinalysis    Component Value Date/Time   COLORURINE YELLOW 01/13/2014 2146   APPEARANCEUR CLOUDY* 01/13/2014 2146   LABSPEC 1.017 01/13/2014 2146   PHURINE 5.0 01/13/2014 2146   GLUCOSEU Negative 03/31/2015 1240   HGBUR LARGE* 01/13/2014 2146   BILIRUBINUR Negative 03/31/2015 1240   BILIRUBINUR NEGATIVE 01/13/2014 2146   [place] 01/13/2014 2146   PROTEINUR 100* 01/13/2014 2146   UROBILINOGEN 0.2 01/13/2014 2146   NITRITE Negative 03/31/2015 1240   NITRITE NEGATIVE 01/13/2014 2146   LEUKOCYTESUR 3+* 03/31/2015 1240   LEUKOCYTESUR NEGATIVE 01/13/2014 2146    Pertinent Imaging: IMIMPRESSION: Prostatic bed mass with probable secondary compression  or postsurgical adhesion producing moderate to severe left hydroureteronephrosis.  No clear fat plane is visible between this mass and the anterior aspect of the rectum, raising the question of rectal invasion. Primary exophytic rectal adenocarcinoma is felt less likely.  Retroperitoneal and left external iliac chain lymphadenopathy compatible with metastatic disease.  L4 osseous metastatic disease with interval sclerosis.  4 mm left upper lobe pulmonary parenchymal nodule, amenable to presumed followup that feature for staining exams.   L4 osseous metastatic disease with interval sclerosis.  4 mm left upper lobe pulmonary parenchymal nodule, amenable to presumed followup that feature for staining exams.   Assessment & Plan:    1. Left Hydroureteronephrosis I discussed with the patient that his severe hydroureteronephrosis needs further evaluation. We discussed that it is likely secondary to  local recurrence of prostate cancer. We discussed him undergoing a cystoscopy, left retrograde pyelogram, and left ureteral stent placement. He was made aware that he will likely need a chronic ureteral stents which would obligate him to stent changes every 3 months. He is also aware that there is a fairly high chance that a retrograde approach may be unsuccessful due to his cancer. In this event, he would need to see interventional radiology for antegrade ureteral stent placement. He is agreeable to this procedure and will be scheduled for in the near future. - Urinalysis, Complete - CULTURE, URINE COMPREHENSIVE   2. Metastatic prostate cancer - Stage IV with metastases to lumbar spine, pelvic lymphadenopathy  He is artery under the care of Dr. Ma Hillock in on Lupron and [place] for bone mets. We will defer further management to medical oncology.   Follow Up: In OR for left ureteral stent placement  Nickie Retort, [place] [address] [place], [place] [address] [phone]

## 2015-06-08 NOTE — Op Note (Signed)
Preoperative diagnosis: Left hydroureteronephrosis  Postoperative diagnosis: Left hydroureteronephrosis  Procedure: Cystoscopy, left retrograde pyelogram, attempted left ureteral stent placement, urethral dilation  Surgeon: Baruch Gouty, M.D.  Findings: There is a high-grade obstruction in the distal left ureter seen on retrograde pyelogram. There is hydroureteronephrosis proximal to this. I was unable to place a wire passed high-grade obstruction.   Anesthesia: Gen.  Disposition: Stable to postanesthesia care unit   Indications for procedure: The patient is a 74 year old gentleman with stage IV metastatic prostate cancer was noted to have left hydroureteronephrosis from his disease. He presents today for further evaluation and attempted stent placement.  Description of procedure: The patient was met in the preoperative area. All risks, benefits, indications procedure described in great detail. The patient consented to the procedure. The patient was aware that there was a very good chance that we will be unable to stent due to his high-grade obstruction. He was informed that he may have to have the procedure with interventional radiology to place an antegrade stent if we were unsuccessful todayu. He was agreeable to this plan. His was taken back to the operative theater. Preoperative antibiotics given. Gen. anesthesia induced per the anesthesia service. This placed in dorsal lithotomy position and prepped and draped in the usual sterile fashion. He was noted at this time to have some meatal stenosis. He was dilated with a 22 and 24 French urethral sound in the distal urethra. A 21 French cystoscope with 30 lens inserted into the patient's bladder per urethra atraumatically. Pan cystoscopy revealed his known prostate . His left ureteral orifice was found. It was surrounded by cancerous tissue. With some degree of difficulty retropyelogram was obtained. This showed high-grade distal obstruction with  proximal hydroureteronephrosis. Attempt at placing a wire past this obstruction were impossible. Small false passage was made through the ureteral orifice. At this point was decided that it was not possible to bypass obstruction with a wire. The patient's bladder was then emptied and the cystoscope removed. He was woken from anesthesia and transferred in stable condition to the postanesthesia care unit.  Plan: The patient will follow-up with interventional radiology for a left antegrade ureteral stent placement. If this is not possible, he will have a left nephrostomy tube placed. He will then follow-up for exchange of the stent in 3 months with urology. He'll continue care for his metastatic Stage IV prostate cancer with medical oncology.

## 2015-06-08 NOTE — Anesthesia Preprocedure Evaluation (Addendum)
Anesthesia Evaluation  Patient identified by MRN, date of birth, ID band Patient awake    Reviewed: Allergy & Precautions, NPO status , Patient's Chart, lab work & pertinent test results, reviewed documented beta blocker date and time   Airway Mallampati: III  TM Distance: <3 FB Neck ROM: Limited    Dental  (+) Upper Dentures, Lower Dentures   Pulmonary pneumonia, resolved, former smoker,    Pulmonary exam normal breath sounds clear to auscultation       Cardiovascular hypertension, Pt. on medications and Pt. on home beta blockers + Past MI and +CHF  Normal cardiovascular exam+ Valvular Problems/Murmurs AS      Neuro/Psych negative neurological ROS  negative psych ROS   GI/Hepatic dysphagia   Endo/Other  diabetes, Well Controlled, Type 2  Renal/GU Renal diseaseHx of ARF     Musculoskeletal  (+) Arthritis , Osteoarthritis,    Abdominal Normal abdominal exam  (+)   Peds  Hematology  (+) anemia , thrombocytopenia   Anesthesia Other Findings   Reproductive/Obstetrics                           Anesthesia Physical Anesthesia Plan  ASA: III  Anesthesia Plan: General   Post-op Pain Management:    Induction: Intravenous  Airway Management Planned: Oral ETT  Additional Equipment:   Intra-op Plan:   Post-operative Plan: Extubation in OR  Informed Consent: I have reviewed the patients History and Physical, chart, labs and discussed the procedure including the risks, benefits and alternatives for the proposed anesthesia with the patient or authorized representative who has indicated his/her understanding and acceptance.   Dental advisory given  Plan Discussed with: CRNA and Surgeon  Anesthesia Plan Comments:         Anesthesia Quick Evaluation

## 2015-06-08 NOTE — Telephone Encounter (Signed)
-----   Message from Nickie Retort, MD sent at 06/08/2015  8:59 AM EDT ----- I was unable to get a stent in [name]. He will need to follow up with IR for left antegrade ureteral stent placement.  He will need the stent they place exchanged in 3 months with [place].  Thanks.

## 2015-06-08 NOTE — Anesthesia Postprocedure Evaluation (Signed)
  Anesthesia Post-op Note  Patient: [name]  Procedure(s) Performed: Procedure(s): CYSTOSCOPY WITH RETROGRADE PYELOGRAM (Left)  Anesthesia type:General  Patient location: PACU  Post pain: Pain level controlled  Post assessment: Post-op Vital signs reviewed, Patient's Cardiovascular Status Stable, Respiratory Function Stable, Patent Airway and No signs of Nausea or vomiting  Post vital signs: Reviewed and stable  Last Vitals:  Filed Vitals:   06/08/15 1022  BP: 150/72  Pulse: 57  Temp:   Resp: 14    Level of consciousness: awake, alert  and patient cooperative  Complications: No apparent anesthesia complications

## 2015-06-08 NOTE — Transfer of Care (Signed)
Immediate Anesthesia Transfer of Care Note  Patient: [name]  Procedure(s) Performed: Procedure(s): CYSTOSCOPY WITH RETROGRADE PYELOGRAM (Left)  Patient Location: PACU  Anesthesia Type:General  Level of Consciousness: awake, alert  and oriented  Airway & Oxygen Therapy: Patient Spontanous Breathing and Patient connected to nasal cannula oxygen  Post-op Assessment: Report given to RN and Post -op Vital signs reviewed and stable  Post vital signs: Reviewed and stable  Last Vitals:  Filed Vitals:   06/08/15 0750  BP: 171/76  Pulse: 63  Temp: 36.8 C  Resp: 18    Complications: No apparent anesthesia complications

## 2015-06-08 NOTE — Discharge Instructions (Signed)
AMBULATORY SURGERY  DISCHARGE INSTRUCTIONS   1) The drugs that you were given will stay in your system until tomorrow so for the next 24 hours you should not:  A) Drive an automobile B) Make any legal decisions C) Drink any alcoholic beverage   2) You may resume regular meals tomorrow.  Today it is better to start with liquids and gradually work up to solid foods.  You may eat anything you prefer, but it is better to start with liquids, then soup and crackers, and gradually work up to solid foods.   3) Please notify your doctor immediately if you have any unusual bleeding, trouble breathing, redness and pain at the surgery site, drainage, fever, or pain not relieved by medication.    4) Additional Instructions:        Please contact your physician with any problems or Same Day Surgery at [phone], Monday through Friday 6 am to 4 pm, or [place] at [facility] number at [phone].     Cystoscopy, Care After Refer to this sheet in the next few weeks. These instructions provide you with information on caring for yourself after your procedure. Your caregiver may also give you more specific instructions. Your treatment has been planned according to current medical practices, but problems sometimes occur. Call your caregiver if you have any problems or questions after your procedure. HOME CARE INSTRUCTIONS  Things you can do to ease any discomfort after your procedure include:  Drinking enough water and fluids to keep your urine clear or pale yellow.  Taking a warm bath to relieve any burning feelings. SEEK IMMEDIATE MEDICAL CARE IF:   You have an increase in blood in your urine.  You notice blood clots in your urine.  You have difficulty passing urine.  You have the chills.  You have abdominal pain.  You have a fever or persistent symptoms for more than 2-3 days.  You have a fever and your symptoms suddenly get worse. MAKE SURE YOU:   Understand these  instructions.  Will watch your condition.  Will get help right away if you are not doing well or get worse.   This information is not intended to replace advice given to you by your health care provider. Make sure you discuss any questions you have with your health care provider.   Document Released: 02/23/2005 Document Revised: 08/27/2014 Document Reviewed: 01/28/2012 Elsevier Interactive Patient Education [employer].

## 2015-06-08 NOTE — Interval H&P Note (Signed)
History and Physical Interval Note:  06/08/2015 7:40 AM  [name]  has presented today for surgery, with the diagnosis of LEFT HYDRONEPHROSIS,PROSTATE CANCER  The various methods of treatment have been discussed with the patient and family. After consideration of risks, benefits and other options for treatment, the patient has consented to  Procedure(s): CYSTOSCOPY WITH RETROGRADE PYELOGRAM (Left) CYSTOSCOPY WITH STENT PLACEMENT (Left) as a surgical intervention .  The patient's history has been reviewed, patient examined, no change in status, stable for surgery.  I have reviewed the patient's chart and labs.  Questions were answered to the patient's satisfaction.    RRR Unlabored resp  Nickie Retort

## 2015-06-13 NOTE — Telephone Encounter (Signed)
Dr Pilar Jarvis needs to place an order in Epic for this procedure so we can get it scheduled.

## 2015-06-14 ENCOUNTER — Other Ambulatory Visit: Payer: Self-pay | Admitting: Urology

## 2015-06-14 DIAGNOSIS — N139 Obstructive and reflux uropathy, unspecified: Secondary | ICD-10-CM

## 2015-06-14 NOTE — Telephone Encounter (Signed)
Pt notified of left antegrade ureteral stent placement on 06/24/15. Pt to arrive at registration in medical mall at 6:45, be npo after mn except for taking coreg & verapamil with a sip of water. Pt verbalizes understanding.

## 2015-06-23 ENCOUNTER — Other Ambulatory Visit: Payer: Self-pay | Admitting: Family Medicine

## 2015-06-23 ENCOUNTER — Other Ambulatory Visit: Payer: Self-pay | Admitting: Radiology

## 2015-06-23 DIAGNOSIS — R0989 Other specified symptoms and signs involving the circulatory and respiratory systems: Secondary | ICD-10-CM

## 2015-06-24 ENCOUNTER — Ambulatory Visit
Admission: RE | Admit: 2015-06-24 | Discharge: 2015-06-24 | Disposition: A | Discharge status: Home / Self Care | Payer: Medicare Other | Source: Ambulatory Visit | Attending: Urology | Admitting: Urology

## 2015-06-24 ENCOUNTER — Ambulatory Visit
Admission: RE | Admit: 2015-06-24 | Discharge: 2015-06-24 | Disposition: A | Discharge status: Home / Self Care | Payer: Medicare Other | Source: Ambulatory Visit | Attending: Interventional Radiology | Admitting: Interventional Radiology

## 2015-06-24 DIAGNOSIS — N135 Crossing vessel and stricture of ureter without hydronephrosis: Secondary | ICD-10-CM | POA: Diagnosis not present

## 2015-06-24 DIAGNOSIS — Q6210 Congenital occlusion of ureter, unspecified: Secondary | ICD-10-CM

## 2015-06-24 DIAGNOSIS — N139 Obstructive and reflux uropathy, unspecified: Secondary | ICD-10-CM

## 2015-06-24 HISTORY — DX: Cardiac murmur, unspecified: R01.1

## 2015-06-24 LAB — CBC
HCT: 39.2 % — ABNORMAL LOW (ref 40.0–52.0)
HEMOGLOBIN: 12.9 g/dL — AB (ref 13.0–18.0)
MCH: 29.1 pg (ref 26.0–34.0)
MCHC: 32.8 g/dL (ref 32.0–36.0)
MCV: 88.7 fL (ref 80.0–100.0)
PLATELETS: 181 10*3/uL (ref 150–440)
RBC: 4.42 MIL/uL (ref 4.40–5.90)
RDW: 16.3 % — ABNORMAL HIGH (ref 11.5–14.5)
WBC: 10.6 10*3/uL (ref 3.8–10.6)

## 2015-06-24 LAB — BASIC METABOLIC PANEL
ANION GAP: 9 (ref 5–15)
BUN: 25 mg/dL — ABNORMAL HIGH (ref 6–20)
CALCIUM: 9.9 mg/dL (ref 8.9–10.3)
CO2: 24 mmol/L (ref 22–32)
CREATININE: 1.5 mg/dL — AB (ref 0.61–1.24)
Chloride: 108 mmol/L (ref 101–111)
GFR, EST AFRICAN AMERICAN: 52 mL/min — AB (ref >60)
GFR, EST NON AFRICAN AMERICAN: 44 mL/min — AB (ref >60)
Glucose, Bld: 132 mg/dL — ABNORMAL HIGH (ref 65–99)
Potassium: 4.3 mmol/L (ref 3.5–5.1)
SODIUM: 141 mmol/L (ref 135–145)

## 2015-06-24 LAB — PROTIME-INR
INR: 0.97
PROTHROMBIN TIME: 13.1 s (ref 11.4–15.0)

## 2015-06-24 LAB — APTT: APTT: 30 s (ref 24–36)

## 2015-06-24 MED ORDER — MIDAZOLAM HCL 5 MG/5ML IJ SOLN
INTRAMUSCULAR | Status: AC | PRN
  Administered 2015-06-24: 0.5 mg via INTRAVENOUS
  Administered 2015-06-24: 1 mg via INTRAVENOUS
  Administered 2015-06-24: 0.5 mg via INTRAVENOUS

## 2015-06-24 MED ORDER — CEFAZOLIN SODIUM-DEXTROSE 2-3 GM-% IV SOLR
2.0000 g | INTRAVENOUS | Status: AC
  Administered 2015-06-24: 2 g via INTRAVENOUS
  Filled 2015-06-24: qty 50

## 2015-06-24 MED ORDER — IOHEXOL 300 MG/ML  SOLN
30.0000 mL | Freq: Once | INTRAMUSCULAR | Status: DC | PRN
Start: 2015-06-24 — End: 2015-06-25
  Administered 2015-06-24: 35 mL
  Filled 2015-06-24: qty 30

## 2015-06-24 MED ORDER — HYDROCODONE-ACETAMINOPHEN 5-325 MG PO TABS: 1.0000 | ORAL_TABLET | ORAL | Status: DC | PRN

## 2015-06-24 MED ORDER — FENTANYL CITRATE (PF) 100 MCG/2ML IJ SOLN
INTRAMUSCULAR | Status: AC | PRN
  Administered 2015-06-24: 25 ug via INTRAVENOUS
  Administered 2015-06-24: 50 ug via INTRAVENOUS

## 2015-06-24 MED ORDER — SODIUM CHLORIDE 0.9 % IV SOLN
Freq: Once | INTRAVENOUS | Status: AC
  Administered 2015-06-24: 08:00:00 via INTRAVENOUS

## 2015-06-24 MED ORDER — LIDOCAINE HCL (PF) 1 % IJ SOLN
INTRAMUSCULAR | Status: DC | PRN
  Administered 2015-06-24: 10 mL

## 2015-06-24 NOTE — Procedures (Signed)
Interventional Radiology Procedure Note  Procedure: Placement of antegrade LEFT ureteral stent.   Complications: None immediate  Estimated Blood Loss: < 25 mL  Recommendations: - Bedrest x 4 hrs - Follow-up with Urology for stent exchange in 12 weeks  Signed,  Criselda Peaches, MD

## 2015-06-30 ENCOUNTER — Ambulatory Visit
Admission: RE | Admit: 2015-06-30 | Discharge: 2015-06-30 | Disposition: A | Discharge status: Home / Self Care | Payer: Medicare Other | Source: Ambulatory Visit | Attending: Family Medicine | Admitting: Family Medicine

## 2015-06-30 DIAGNOSIS — R0989 Other specified symptoms and signs involving the circulatory and respiratory systems: Secondary | ICD-10-CM

## 2015-07-04 ENCOUNTER — Inpatient Hospital Stay: Payer: Medicare Other

## 2015-07-04 ENCOUNTER — Inpatient Hospital Stay: Payer: Medicare Other | Attending: Internal Medicine

## 2015-07-04 ENCOUNTER — Other Ambulatory Visit: Payer: Self-pay | Admitting: Internal Medicine

## 2015-07-04 VITALS — BP 139/79 | HR 68 | Temp 96.1°F | Resp 16

## 2015-07-04 DIAGNOSIS — Z79899 Other long term (current) drug therapy: Secondary | ICD-10-CM | POA: Diagnosis not present

## 2015-07-04 DIAGNOSIS — C61 Malignant neoplasm of prostate: Secondary | ICD-10-CM | POA: Insufficient documentation

## 2015-07-04 DIAGNOSIS — C7951 Secondary malignant neoplasm of bone: Secondary | ICD-10-CM | POA: Insufficient documentation

## 2015-07-04 LAB — PSA: PSA: 1.39 ng/mL (ref 0.00–4.00)

## 2015-07-04 LAB — CALCIUM: CALCIUM: 10 mg/dL (ref 8.9–10.3)

## 2015-07-04 MED ORDER — DENOSUMAB 120 MG/1.7ML ~~LOC~~ SOLN
120.0000 mg | Freq: Once | SUBCUTANEOUS | Status: AC
  Administered 2015-07-04: 120 mg via SUBCUTANEOUS
  Filled 2015-07-04: qty 1.7

## 2015-07-28 ENCOUNTER — Encounter: Payer: Self-pay | Admitting: *Deleted

## 2015-07-28 ENCOUNTER — Other Ambulatory Visit: Payer: Self-pay | Admitting: *Deleted

## 2015-07-28 DIAGNOSIS — C61 Malignant neoplasm of prostate: Secondary | ICD-10-CM

## 2015-07-28 DIAGNOSIS — C7951 Secondary malignant neoplasm of bone: Principal | ICD-10-CM

## 2015-08-01 ENCOUNTER — Inpatient Hospital Stay: Payer: Medicare Other | Attending: Internal Medicine | Admitting: Internal Medicine

## 2015-08-01 ENCOUNTER — Inpatient Hospital Stay: Payer: Medicare Other

## 2015-08-01 VITALS — BP 149/87 | HR 61 | Temp 98.6°F | Ht 71.0 in | Wt 212.1 lb

## 2015-08-01 DIAGNOSIS — Z9079 Acquired absence of other genital organ(s): Secondary | ICD-10-CM | POA: Diagnosis not present

## 2015-08-01 DIAGNOSIS — K219 Gastro-esophageal reflux disease without esophagitis: Secondary | ICD-10-CM | POA: Insufficient documentation

## 2015-08-01 DIAGNOSIS — Z809 Family history of malignant neoplasm, unspecified: Secondary | ICD-10-CM | POA: Diagnosis not present

## 2015-08-01 DIAGNOSIS — E7800 Pure hypercholesterolemia, unspecified: Secondary | ICD-10-CM | POA: Insufficient documentation

## 2015-08-01 DIAGNOSIS — I10 Essential (primary) hypertension: Secondary | ICD-10-CM | POA: Insufficient documentation

## 2015-08-01 DIAGNOSIS — Z87891 Personal history of nicotine dependence: Secondary | ICD-10-CM | POA: Diagnosis not present

## 2015-08-01 DIAGNOSIS — Z79899 Other long term (current) drug therapy: Secondary | ICD-10-CM | POA: Diagnosis not present

## 2015-08-01 DIAGNOSIS — R011 Cardiac murmur, unspecified: Secondary | ICD-10-CM | POA: Insufficient documentation

## 2015-08-01 DIAGNOSIS — E119 Type 2 diabetes mellitus without complications: Secondary | ICD-10-CM | POA: Insufficient documentation

## 2015-08-01 DIAGNOSIS — C7951 Secondary malignant neoplasm of bone: Principal | ICD-10-CM

## 2015-08-01 DIAGNOSIS — M129 Arthropathy, unspecified: Secondary | ICD-10-CM | POA: Diagnosis not present

## 2015-08-01 DIAGNOSIS — Z7982 Long term (current) use of aspirin: Secondary | ICD-10-CM | POA: Diagnosis not present

## 2015-08-01 DIAGNOSIS — C61 Malignant neoplasm of prostate: Secondary | ICD-10-CM | POA: Diagnosis not present

## 2015-08-01 DIAGNOSIS — N1330 Unspecified hydronephrosis: Secondary | ICD-10-CM | POA: Insufficient documentation

## 2015-08-01 DIAGNOSIS — Z7951 Long term (current) use of inhaled steroids: Secondary | ICD-10-CM | POA: Diagnosis not present

## 2015-08-01 DIAGNOSIS — Z79818 Long term (current) use of other agents affecting estrogen receptors and estrogen levels: Secondary | ICD-10-CM | POA: Diagnosis not present

## 2015-08-01 LAB — CBC WITH DIFFERENTIAL/PLATELET
BASOS PCT: 1 %
Basophils Absolute: 0.1 10*3/uL (ref 0–0.1)
Eosinophils Absolute: 0.6 10*3/uL (ref 0–0.7)
Eosinophils Relative: 6 %
HEMATOCRIT: 41.4 % (ref 40.0–52.0)
Hemoglobin: 13.6 g/dL (ref 13.0–18.0)
Lymphocytes Relative: 18 %
Lymphs Abs: 1.8 10*3/uL (ref 1.0–3.6)
MCH: 28.7 pg (ref 26.0–34.0)
MCHC: 32.9 g/dL (ref 32.0–36.0)
MCV: 87.1 fL (ref 80.0–100.0)
MONO ABS: 0.9 10*3/uL (ref 0.2–1.0)
MONOS PCT: 9 %
NEUTROS ABS: 6.7 10*3/uL — AB (ref 1.4–6.5)
Neutrophils Relative %: 66 %
Platelets: 183 10*3/uL (ref 150–440)
RBC: 4.75 MIL/uL (ref 4.40–5.90)
RDW: 15.7 % — AB (ref 11.5–14.5)
WBC: 10.1 10*3/uL (ref 3.8–10.6)

## 2015-08-01 LAB — COMPREHENSIVE METABOLIC PANEL
ALT: 14 U/L — ABNORMAL LOW (ref 17–63)
ANION GAP: 7 (ref 5–15)
AST: 21 U/L (ref 15–41)
Albumin: 4.1 g/dL (ref 3.5–5.0)
Alkaline Phosphatase: 61 U/L (ref 38–126)
BILIRUBIN TOTAL: 0.3 mg/dL (ref 0.3–1.2)
BUN: 31 mg/dL — ABNORMAL HIGH (ref 6–20)
CO2: 25 mmol/L (ref 22–32)
Calcium: 10.1 mg/dL (ref 8.9–10.3)
Chloride: 103 mmol/L (ref 101–111)
Creatinine, Ser: 1.4 mg/dL — ABNORMAL HIGH (ref 0.61–1.24)
GFR, EST AFRICAN AMERICAN: 56 mL/min — AB (ref >60)
GFR, EST NON AFRICAN AMERICAN: 48 mL/min — AB (ref >60)
Glucose, Bld: 113 mg/dL — ABNORMAL HIGH (ref 65–99)
POTASSIUM: 4.4 mmol/L (ref 3.5–5.1)
Sodium: 135 mmol/L (ref 135–145)
TOTAL PROTEIN: 7.3 g/dL (ref 6.5–8.1)

## 2015-08-01 MED ORDER — DENOSUMAB 120 MG/1.7ML ~~LOC~~ SOLN
120.0000 mg | Freq: Once | SUBCUTANEOUS | Status: AC
  Administered 2015-08-01: 120 mg via SUBCUTANEOUS
  Filled 2015-08-01: qty 1.7

## 2015-08-01 MED ORDER — LEUPROLIDE ACETATE (4 MONTH) 30 MG IM KIT
30.0000 mg | PACK | Freq: Once | INTRAMUSCULAR | Status: AC
  Administered 2015-08-01: 30 mg via INTRAMUSCULAR
  Filled 2015-08-01: qty 30

## 2015-08-01 NOTE — Progress Notes (Signed)
[place] OFFICE PROGRESS NOTE  Patient Care Team: Tamsen Roers, MD as PCP - General (Family Medicine)   SUMMARY OF ONCOLOGIC HISTORY:  # AUG 2016- METASTATIC PROSTATE CA/ STAGE IV; Castrate sensitive [PSA- 48] ; mets- lumbar spine [s/p pal RT to Aug 2016; Dr.Crystal] /Pelvic LN; 1994- Prostate CA s/p Surgery [facility]]  # Bone lesions on denosumab;DEc 2016- Recm q 64M  # Left hydronephrosis s/p stenting   INTERVAL HISTORY:  This is my first interaction with the patient since I joined the practice September 2016. I reviewed the patient's prior charts/pertinent labs/imaging in detail; findings are summarized above.   A very pleasant 74 year old male patient with above history of prostate cancer stage IV hormone sensitive currently on Lupron every 4 months is here for follow-up. He is also on denosumab.  Patient's appetite is good. His back pain is resolved. He does not take any pain medications on a regular basis. Denies any chest pain  REVIEW OF SYSTEMS:  A complete 10 point review of system is done which is negative except mentioned above/history of present illness.   PAST MEDICAL HISTORY :  Past Medical History  Diagnosis Date  . Diabetes mellitus without complication ([place])   . Hypertension   . Hypercholesteremia   . GERD (gastroesophageal reflux disease)   . Prostate cancer ([place])     metastatic  . Arthritis   . Heart murmur     PAST SURGICAL HISTORY :   Past Surgical History  Procedure Laterality Date  . Prostatectomy  1994  . Colon resection    . Eye surgery    . Cystoscopy w/ retrogrades Left 06/08/2015    Procedure: CYSTOSCOPY WITH RETROGRADE PYELOGRAM;  Surgeon: Nickie Retort, MD;  Location: [facility] ORS;  Service: Urology;  Laterality: Left;    FAMILY HISTORY :   Family History  Problem Relation Age of Onset  . Aneurysm Father   . Cancer Mother     SOCIAL HISTORY:   Social History  Substance Use Topics  . Smoking status: Former  Smoker -- 1.50 packs/day for 25 years    Types: Cigarettes    Quit date: 01/21/1974  . Smokeless tobacco: Never Used  . Alcohol Use: No    ALLERGIES:  has No Known Allergies.  MEDICATIONS:  Current Outpatient Prescriptions  Medication Sig Dispense Refill  . allopurinol (ZYLOPRIM) 300 MG tablet Take 300 mg by mouth daily.    Marland Kitchen aspirin 81 MG chewable tablet Chew 1 tablet (81 mg total) by mouth daily. 30 tablet 2  . carvedilol (COREG) 6.25 MG tablet Take 1 tablet (6.25 mg total) by mouth 2 (two) times daily with a meal. 60 tablet 2  . Cyanocobalamin (VITAMIN B 12 PO) Take 1,000 mcg by mouth daily.    . ferrous sulfate 325 (65 FE) MG tablet Take 325 mg by mouth 2 (two) times daily with a meal.    . lovastatin (MEVACOR) 20 MG tablet Take 20 mg by mouth daily.    Marland Kitchen oxybutynin (DITROPAN) 5 MG tablet Take 5 mg by mouth 4 (four) times daily.    . pantoprazole (PROTONIX) 40 MG tablet Take 1 tablet (40 mg total) by mouth daily before breakfast. 30 tablet 2  . ranitidine (ZANTAC) 150 MG tablet Take 150 mg by mouth 2 (two) times daily.    . verapamil (CALAN-SR) 240 MG CR tablet Take 1 tablet (240 mg total) by mouth daily. (Patient taking differently: Take 240 mg by mouth 2 (two) times daily. )  30 tablet 2   No current facility-administered medications for this visit.    PHYSICAL EXAMINATION: ECOG PERFORMANCE STATUS: 0 - Asymptomatic  BP 149/87 mmHg  Pulse 61  Temp(Src) 98.6 F (37 C) (Oral)  Ht 5\' 11"  (1.803 m)  Wt 212 lb 1.3 oz (96.2 kg)  BMI 29.59 kg/m2  Filed Weights   08/01/15 1320  Weight: 212 lb 1.3 oz (96.2 kg)    GENERAL: Well-nourished well-developed; Alert, no distress and comfortable.   Accompanied by his wife. EYES: no pallor or icterus OROPHARYNX: no thrush or ulceration; dentures. NECK: supple, no masses felt LYMPH:  no palpable lymphadenopathy in the cervical, axillary or inguinal regions LUNGS: clear to auscultation and  No wheeze or crackles HEART/CVS: regular rate  & rhythm and no murmurs; No lower extremity edema ABDOMEN:abdomen soft, non-tender and normal bowel sounds Musculoskeletal:no cyanosis of digits and no clubbing  PSYCH: alert & oriented x 3 with fluent speech NEURO: no focal motor/sensory deficits SKIN:  no rashes or significant lesions  LABORATORY DATA:  I have reviewed the data as listed    Component Value Date/Time   NA 135 08/01/2015 1258   K 4.4 08/01/2015 1258   CL 103 08/01/2015 1258   CO2 25 08/01/2015 1258   GLUCOSE 113* 08/01/2015 1258   BUN 31* 08/01/2015 1258   CREATININE 1.40* 08/01/2015 1258   CALCIUM 10.1 08/01/2015 1258   PROT 7.3 08/01/2015 1258   ALBUMIN 4.1 08/01/2015 1258   AST 21 08/01/2015 1258   ALT 14* 08/01/2015 1258   ALKPHOS 61 08/01/2015 1258   BILITOT 0.3 08/01/2015 1258   GFRNONAA 48* 08/01/2015 1258   GFRAA 56* 08/01/2015 1258    No results found for: SPEP, UPEP  Lab Results  Component Value Date   WBC 10.1 08/01/2015   NEUTROABS 6.7* 08/01/2015   HGB 13.6 08/01/2015   HCT 41.4 08/01/2015   MCV 87.1 08/01/2015   PLT 183 08/01/2015      Chemistry      Component Value Date/Time   NA 135 08/01/2015 1258   K 4.4 08/01/2015 1258   CL 103 08/01/2015 1258   CO2 25 08/01/2015 1258   BUN 31* 08/01/2015 1258   CREATININE 1.40* 08/01/2015 1258      Component Value Date/Time   CALCIUM 10.1 08/01/2015 1258   ALKPHOS 61 08/01/2015 1258   AST 21 08/01/2015 1258   ALT 14* 08/01/2015 1258   BILITOT 0.3 08/01/2015 1258       RADIOGRAPHIC STUDIES: I have personally reviewed the radiological images as listed and agreed with the findings in the report. No results found.   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 Patient's most recent PSA is down to 1.5; from 48 in August 2016/prior to starting treatment. Patient tolerating therapy fairly well continue current therapy.  # I had a long discussion the patient and his wife  regarding the stage IV/incurable nature of the disease. However, given the long relapse free survival [~20 years] since the diagnosis of prostate cancer- I think patient will have likely a less aggressive course. Patient is not interested in chemotherapy.  # Bone lesions- denosumab every 4 weeks; since patient is castrate sensitive; I would recommend switching the denosumab every 4 months at this time. Patient agrees.  # Left ureteral stent/hydronephrosis- likely from pelvic adenopathy from his malignancy. Defer management to urology.  # 40 minutes face-to-face with the patient discussing the above plan of  care; more than 50% of time spent on prognosis/ natural history; counseling and coordination.      Cammie Sickle, MD 08/01/2015 1:26 PM

## 2015-08-24 ENCOUNTER — Telehealth: Payer: Self-pay | Admitting: *Deleted

## 2015-08-24 NOTE — Telephone Encounter (Signed)
Patient called to ask for letter to be written on his behalf. He is trying to apply for a life insurance policy. He needs a doctor's statement/letter written to verify his dx and current tx plan. He states that he does not have any forms at this time for the doctor to complete; however, he just needs the letter for now. RN informed patient that MD is out of the office until 08/29/15. I will write the letter and have md sign on 08/29/15 when md returns. Patient demonstrated verbal understanding.

## 2015-09-01 NOTE — Telephone Encounter (Signed)
Patient notified via telephone call by Gay Filler, RN. letter written. He may pick letter up at reception desk at cancer center.

## 2015-09-02 ENCOUNTER — Telehealth: Payer: Self-pay | Admitting: *Deleted

## 2015-09-02 ENCOUNTER — Telehealth: Payer: Self-pay | Admitting: Urology

## 2015-09-02 DIAGNOSIS — N201 Calculus of ureter: Secondary | ICD-10-CM

## 2015-09-02 MED ORDER — TRAMADOL HCL 50 MG PO TABS: 50.0000 mg | ORAL_TABLET | Freq: Two times a day (BID) | ORAL | Status: DC | PRN

## 2015-09-02 NOTE — Telephone Encounter (Signed)
Spoke with pt in reference to pain. Made aware for the need of RUS. Pt voiced understanding. Also advised pt to call his oncologist. Pt voiced understanding.

## 2015-09-02 NOTE — Telephone Encounter (Signed)
Called reporting back pain since stent placed andto request pain med, saw Urologist this morning and they refused to give him pain med stating he needed to call oncology for it.

## 2015-09-02 NOTE — Telephone Encounter (Signed)
Patient is having back pain from the stent and would like pain meds. He has an appt for post op to discuss the stent exchange on 09-16-15. And he has not had a bowel movement 4 days and wants to know if the stent would have anything to do with that?  He want meds called [facility]. Can you call them back if there is a problem please.   Thanks,  Sharyn Lull

## 2015-09-02 NOTE — Telephone Encounter (Signed)
Tramadol 50 mg faxed to Pharmacy pt informed

## 2015-09-02 NOTE — Telephone Encounter (Signed)
He needs a renal ultrasound to ensure that his stent is functioning.  Stent would not effect bowel movements.  He has metastatic prostate cancer, so pain meds should come from his oncologist.

## 2015-09-12 ENCOUNTER — Ambulatory Visit
Admission: RE | Admit: 2015-09-12 | Discharge: 2015-09-12 | Disposition: A | Discharge status: Home / Self Care | Payer: Medicare Other | Source: Ambulatory Visit | Attending: Urology | Admitting: Urology

## 2015-09-12 DIAGNOSIS — N200 Calculus of kidney: Secondary | ICD-10-CM | POA: Insufficient documentation

## 2015-09-12 DIAGNOSIS — N201 Calculus of ureter: Secondary | ICD-10-CM | POA: Diagnosis present

## 2015-09-16 ENCOUNTER — Encounter: Payer: Self-pay | Admitting: Urology

## 2015-09-16 ENCOUNTER — Ambulatory Visit (INDEPENDENT_AMBULATORY_CARE_PROVIDER_SITE_OTHER): Payer: Medicare Other | Admitting: Urology

## 2015-09-16 VITALS — BP 124/66 | HR 69 | Ht 71.0 in | Wt 214.9 lb

## 2015-09-16 DIAGNOSIS — G8929 Other chronic pain: Secondary | ICD-10-CM | POA: Diagnosis not present

## 2015-09-16 DIAGNOSIS — C61 Malignant neoplasm of prostate: Secondary | ICD-10-CM

## 2015-09-16 DIAGNOSIS — N1339 Other hydronephrosis: Secondary | ICD-10-CM | POA: Diagnosis not present

## 2015-09-16 MED ORDER — HYDROCODONE-ACETAMINOPHEN 5-325 MG PO TABS: 1.0000 | ORAL_TABLET | ORAL | Status: DC | PRN

## 2015-09-16 NOTE — Progress Notes (Signed)
09/16/2015 3:21 PM   [name] Oct 16, 1940 [id number]  Referring provider: Tamsen Roers, MD [address], [place] [address]  Chief Complaint  Patient presents with  . Pre-op Exam    Hydronephrosis with ureteral stricture    HPI: The patient is a 75 year old gentleman with a known past medical history of stage IV metastatic prostate cancer. He presents today with left hydroureteronephrosis. This was seen on both the recent CT scan and bone scan. He also has what appears to be a mass in his prostatics bed and possibly growing into his bladder. He had a radical prostatectomy in 1992. We do not have records from this procedure. The patient does not remember where this was done. He saw Dr. Elnoria Howard a few weeks ago and noted a large firm mass at the base the bladder. He now has what appears to be this newfound left hydronephrosis. His creatinine is 1.6. He sees Dr. Leia Alf recently started him on Lupron. His most recent PSA prior to Lupron was 48.31 month ago.  January 2017 Interval history: The patient underwent a antegrade left ureteral stent placement for his left hydronephrosis after unsuccessful attempt at placing a left ureteral stent. He presented today to discuss management of his stent as he is due for change. He did have a recent renal ultrasound for left flank pain that showed the stent in place draining well. It did show a possible stone on the right side 9 mm in side. However he did get a CT scan to 6 months ago which was showed 1-2 mm punctate stones so I feel that this is a shadow not a true stone. He also has chronic pain from his metastatic prostate cancer. He is on tramadol amount is out of pills. He states it does not work for him at this time.   PMH: Past Medical History  Diagnosis Date  . Diabetes mellitus without complication ([place])   . Hypertension   . Hypercholesteremia   . GERD (gastroesophageal reflux disease)   . Prostate cancer ([place])     metastatic  .  Arthritis   . Heart murmur   . Aortic stenosis, moderate 01/30/2014  . Atrial flutter ([place]) 02/02/2014  . HTN (hypertension) 01/30/2014  . NSTEMI (non-ST elevated myocardial infarction) ([place]) 01/30/2014    Surgical History: Past Surgical History  Procedure Laterality Date  . Prostatectomy  1994  . Colon resection    . Eye surgery    . Cystoscopy w/ retrogrades Left 06/08/2015    Procedure: CYSTOSCOPY WITH RETROGRADE PYELOGRAM;  Surgeon: Nickie Retort, MD;  Location: [facility] ORS;  Service: Urology;  Laterality: Left;    Home Medications:    Medication List       This list is accurate as of: 09/16/15  3:21 PM.  Always use your most recent med list.               allopurinol 300 MG tablet  Commonly known as:  ZYLOPRIM  Take 300 mg by mouth daily.     aspirin 81 MG chewable tablet  Chew 1 tablet (81 mg total) by mouth daily.     atorvastatin 10 MG tablet  Commonly known as:  LIPITOR     carvedilol 6.25 MG tablet  Commonly known as:  COREG  Take 1 tablet (6.25 mg total) by mouth 2 (two) times daily with a meal.     ferrous sulfate 325 (65 FE) MG tablet  Take 325 mg by mouth 2 (  two) times daily with a meal.     lovastatin 20 MG tablet  Commonly known as:  MEVACOR  Take 20 mg by mouth daily. Reported on 09/16/2015     oxybutynin 5 MG tablet  Commonly known as:  DITROPAN  Take 5 mg by mouth 4 (four) times daily.     pantoprazole 40 MG tablet  Commonly known as:  PROTONIX  Take 1 tablet (40 mg total) by mouth daily before breakfast.     ranitidine 150 MG tablet  Commonly known as:  ZANTAC  Take 150 mg by mouth 2 (two) times daily.     traMADol 50 MG tablet  Commonly known as:  ULTRAM  Take 1 tablet (50 mg total) by mouth 2 (two) times daily as needed.     verapamil 240 MG CR tablet  Commonly known as:  CALAN-SR  Take 1 tablet (240 mg total) by mouth daily.     VITAMIN B 12 PO  Take 1,000 mcg by mouth daily.        Allergies: No Known Allergies  Family  History: Family History  Problem Relation Age of Onset  . Aneurysm Father   . Cancer Mother     Social History:  reports that he quit smoking about 41 years ago. His smoking use included Cigarettes. He has a 37.5 pack-year smoking history. He has never used smokeless tobacco. He reports that he does not drink alcohol or use illicit drugs.  ROS: UROLOGY Frequent Urination?: No Hard to postpone urination?: No Burning/pain with urination?: No Get up at night to urinate?: Yes Leakage of urine?: Yes Urine stream starts and stops?: No Trouble starting stream?: No Do you have to strain to urinate?: No Blood in urine?: No Urinary tract infection?: No Sexually transmitted disease?: No Injury to kidneys or bladder?: No Painful intercourse?: No Weak stream?: No Erection problems?: No Penile pain?: No  Gastrointestinal Nausea?: No Vomiting?: No Indigestion/heartburn?: No Diarrhea?: No Constipation?: No  Constitutional Fever: No Night sweats?: No Weight loss?: No Fatigue?: No  Skin Skin rash/lesions?: No Itching?: No  Eyes Blurred vision?: No Double vision?: No  Ears/Nose/Throat Sore throat?: No Sinus problems?: No  Hematologic/Lymphatic Swollen glands?: No Easy bruising?: No  Cardiovascular Leg swelling?: No Chest pain?: No  Respiratory Cough?: No Shortness of breath?: No  Endocrine Excessive thirst?: Yes  Musculoskeletal Back pain?: Yes Joint pain?: No  Neurological Headaches?: No Dizziness?: No  Psychologic Depression?: No Anxiety?: No  Physical Exam: BP 124/66 mmHg  Pulse 69  Ht 5\' 11"  (1.803 m)  Wt 214 lb 14.4 oz (97.478 kg)  BMI 29.99 kg/m2  Constitutional:  Alert and oriented, No acute distress. HEENT: [place] AT, moist mucus membranes.  Trachea midline, no masses. Cardiovascular: No clubbing, cyanosis, or edema. Respiratory: Normal respiratory effort, no increased work of breathing. GI: Abdomen is soft, nontender, nondistended, no  abdominal masses GU: No CVA tenderness. Skin: No rashes, bruises or suspicious lesions. Lymph: No cervical or inguinal adenopathy. Neurologic: Grossly intact, no focal deficits, moving all 4 extremities. Psychiatric: Normal mood and affect.  Laboratory Data: Lab Results  Component Value Date   WBC 10.1 08/01/2015   HGB 13.6 08/01/2015   HCT 41.4 08/01/2015   MCV 87.1 08/01/2015   PLT 183 08/01/2015    Lab Results  Component Value Date   CREATININE 1.40* 08/01/2015    Lab Results  Component Value Date   PSA 1.39 07/04/2015   PSA 48.22* 03/24/2015    Lab Results  Component Value  Date   TESTOSTERONE 228* 03/24/2015    Lab Results  Component Value Date   HGBA1C 5.5 01/30/2014    Urinalysis    Component Value Date/Time   COLORURINE YELLOW 01/13/2014 2146   APPEARANCEUR CLOUDY* 01/13/2014 2146   LABSPEC 1.017 01/13/2014 2146   PHURINE 5.0 01/13/2014 2146   GLUCOSEU Negative 05/10/2015 1335   HGBUR LARGE* 01/13/2014 2146   BILIRUBINUR Negative 05/10/2015 1335   BILIRUBINUR NEGATIVE 01/13/2014 2146   KETONESUR NEGATIVE 01/13/2014 2146   PROTEINUR 100* 01/13/2014 2146   UROBILINOGEN 0.2 01/13/2014 2146   NITRITE Negative 05/10/2015 1335   NITRITE NEGATIVE 01/13/2014 2146   LEUKOCYTESUR Negative 05/10/2015 1335   LEUKOCYTESUR NEGATIVE 01/13/2014 2146     Assessment & Plan:     1. Left Hydroureteronephrosis The patient is due for a left ureteral stent exchange. Discussed with the patient the risks, benefits, indications of the procedure. He understands that the goal being to change the stent using the procedure placed left antegrade ureteral stent. He understands if access is lost that he will need a repeat antegrade ureteral stent placement in interventional radiology. He agrees to this procedure and elects to proceed.  2. Metastatic prostate cancer - Stage IV with metastases to lumbar spine, pelvic lymphadenopathy  He is under the care of Dr. Rogue Bussing  on  Lupron and Delton See for bone mets. We will defer further management to medical oncology.   3. Chronic pain I prescribed the patient Norco 12/21/2023 milligrams every 4 hours with no refills. I instructed the patient that further pain medication should come from the cancer center as they are managing his metastatic disease.  No Follow-up on file.  Nickie Retort, MD  [facility] [address], [place], [place] [address] [phone]

## 2015-09-19 ENCOUNTER — Telehealth: Payer: Self-pay | Admitting: Radiology

## 2015-09-19 NOTE — Telephone Encounter (Signed)
Notified pt of surgery scheduled 10/05/15, pre-admit testing appt on 2/8 @9 :30 and to call day prior to surgery for arrival time to SDS. Advised pt that he should continue taking ASA 81mg  prior to surgery. Pt voices understanding.

## 2015-09-21 ENCOUNTER — Other Ambulatory Visit: Payer: Medicare Other

## 2015-09-28 ENCOUNTER — Encounter
Admission: RE | Admit: 2015-09-28 | Discharge: 2015-09-28 | Disposition: A | Discharge status: Home / Self Care | Payer: Medicare Other | Source: Ambulatory Visit | Attending: Urology | Admitting: Urology

## 2015-09-28 DIAGNOSIS — Z01812 Encounter for preprocedural laboratory examination: Secondary | ICD-10-CM | POA: Diagnosis not present

## 2015-09-28 LAB — BASIC METABOLIC PANEL
ANION GAP: 6 (ref 5–15)
BUN: 28 mg/dL — ABNORMAL HIGH (ref 6–20)
CO2: 26 mmol/L (ref 22–32)
Calcium: 10.2 mg/dL (ref 8.9–10.3)
Chloride: 106 mmol/L (ref 101–111)
Creatinine, Ser: 1.43 mg/dL — ABNORMAL HIGH (ref 0.61–1.24)
GFR calc Af Amer: 54 mL/min — ABNORMAL LOW (ref >60)
GFR calc non Af Amer: 47 mL/min — ABNORMAL LOW (ref >60)
GLUCOSE: 128 mg/dL — AB (ref 65–99)
POTASSIUM: 4.3 mmol/L (ref 3.5–5.1)
Sodium: 138 mmol/L (ref 135–145)

## 2015-09-28 LAB — CBC
HEMATOCRIT: 39.1 % — AB (ref 40.0–52.0)
Hemoglobin: 12.9 g/dL — ABNORMAL LOW (ref 13.0–18.0)
MCH: 29.1 pg (ref 26.0–34.0)
MCHC: 33 g/dL (ref 32.0–36.0)
MCV: 87.9 fL (ref 80.0–100.0)
Platelets: 178 10*3/uL (ref 150–440)
RBC: 4.45 MIL/uL (ref 4.40–5.90)
RDW: 15.8 % — ABNORMAL HIGH (ref 11.5–14.5)
WBC: 9.7 10*3/uL (ref 3.8–10.6)

## 2015-09-28 NOTE — Pre-Procedure Instructions (Signed)
Per Amy at Dr Pilar Jarvis office WBC order should be Hemogram

## 2015-09-28 NOTE — Pre-Procedure Instructions (Signed)
Faxed abnormal labs to Dr Budzyn 

## 2015-09-28 NOTE — Patient Instructions (Signed)
  Your procedure is scheduled on: Wednesday 10/05/15 Report to Day Surgery. 2ND FLOOR MEDCIAL MALL ENTRANCE To find out your arrival time please call [phone] between 1PM - 3PM on Tuesday 10/04/15.  Remember: Instructions that are not followed completely may result in serious medical risk, up to and including death, or upon the discretion of your surgeon and anesthesiologist your surgery may need to be rescheduled.    __X__ 1. Do not eat food or drink liquids after midnight. No gum chewing or hard candies.     __X__ 2. No Alcohol for 24 hours before or after surgery.   ____ 3. Bring all medications with you on the day of surgery if instructed.    __X__ 4. Notify your doctor if there is any change in your medical condition     (cold, fever, infections).     Do not wear jewelry, make-up, hairpins, clips or nail polish.  Do not wear lotions, powders, or perfumes. You may wear deodorant.  Do not shave 48 hours prior to surgery. Men may shave face and neck.  Do not bring valuables to the hospital.    [facility] is not responsible for any belongings or valuables.               Contacts, dentures or bridgework may not be worn into surgery.  Leave your suitcase in the car. After surgery it may be brought to your room.  For patients admitted to the hospital, discharge time is determined by your                treatment team.   Patients discharged the day of surgery will not be allowed to drive home.   Please read over the following fact sheets that you were given:   Surgical Site Infection Prevention   __X__ Take these medicines the morning of surgery with A SIP OF WATER:    1. CARVEDILOL  2. PANTOPRAZOLE  3. VERAPAMIL  4. MAY TAKE HYDROCODONE IF NEEDED FOR PAIN  5.  6.  ____ Fleet Enema (as directed)   ____ Use CHG Soap as directed  ____ Use inhalers on the day of surgery  ____ Stop metformin 2 days prior to surgery    ____ Take 1/2 of usual insulin dose the night before  surgery and none on the morning of surgery.   __X__ Stop Coumadin/Plavix/aspirin on AS DIRECTED BY YOU DOCTOR  ____ Stop Anti-inflammatories on    ____ Stop supplements until after surgery.    ____ Bring C-Pap to the hospital.

## 2015-09-29 ENCOUNTER — Telehealth: Payer: Self-pay | Admitting: Radiology

## 2015-09-29 IMAGING — CR DG CHEST 1V PORT
1 series · 1 of 1 positions shown · non-contrast
Comparison: None.

CLINICAL DATA: Hemodialysis catheter placement

EXAM:
Portable exam 0002 hr compared to 01/13/2014

[AP]
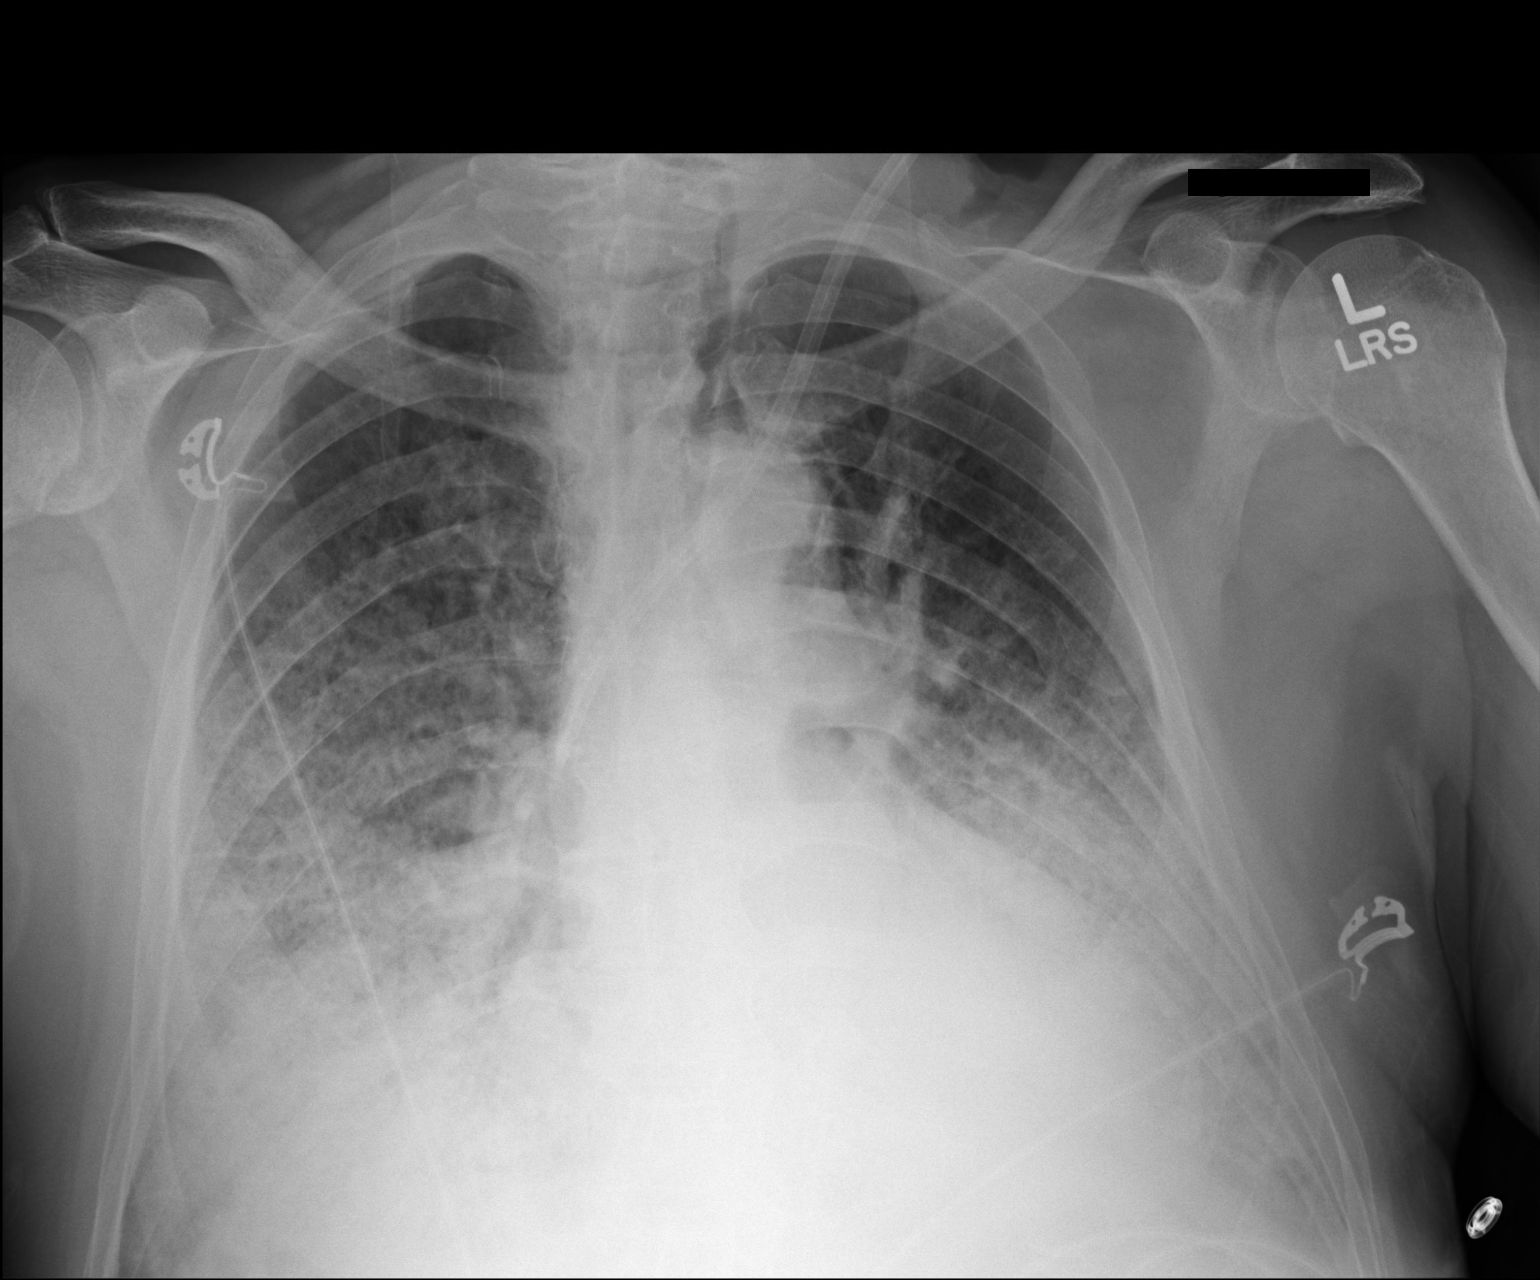

[1 of 1 positions shown; findings below may reference images not displayed]

FINDINGS: LEFT jugular dual-lumen central venous catheter with tip projecting
over mid SVC.

Enlargement of cardiac silhouette with pulmonary vascular
congestion.

Perihilar and basilar infiltrates which could represent edema or
infection.

No pneumothorax.

Bones unremarkable.
IMPRESSION: No pneumothorax following LEFT jugular line placement.

Enlargement of cardiac silhouette with pulmonary vascular congestion
and diffuse increased pulmonary infiltrates, question edema versus
infection.

## 2015-09-29 IMAGING — US US RENAL
1 series · 14 of 21 positions shown · non-contrast
Comparison: None

CLINICAL DATA: Renal failure, history hypertension, diabetes

EXAM:
RENAL/URINARY TRACT ULTRASOUND COMPLETE

[Series 1: us renal · 0.23mm/px · 14 of 21 slices shown]
[im 1/21]
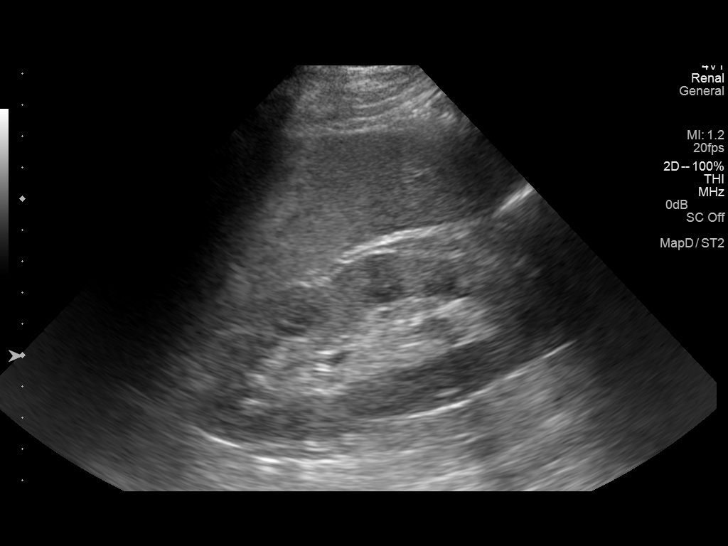
[im 3/21]
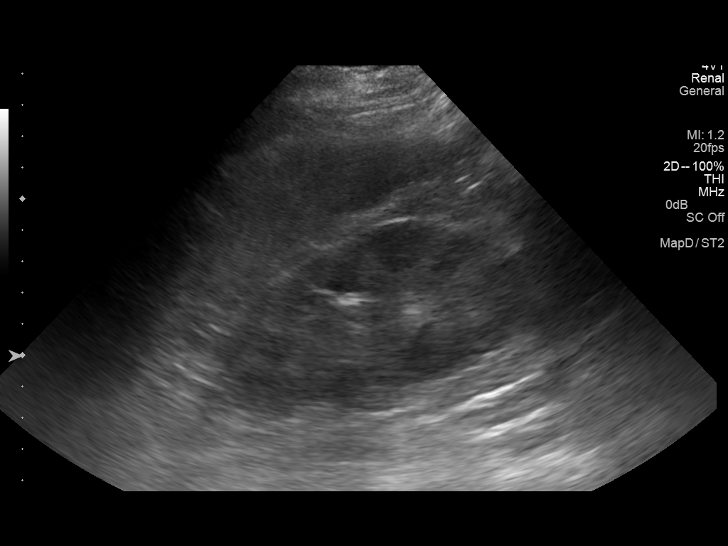
[im 4/21]
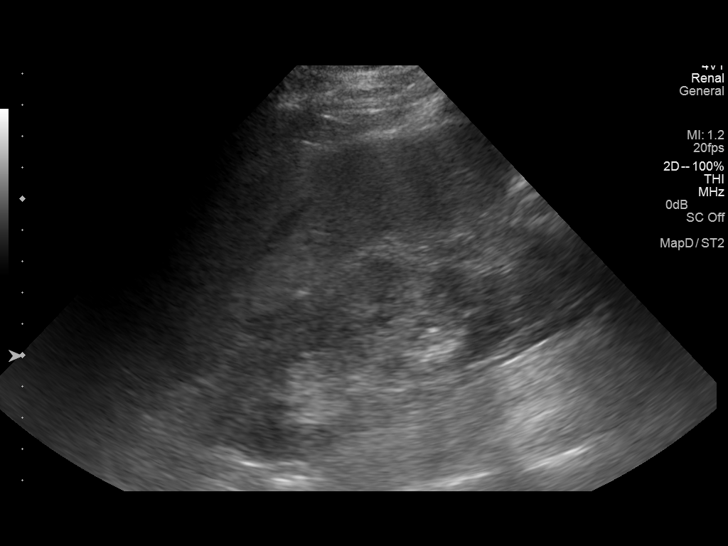
[im 6/21]
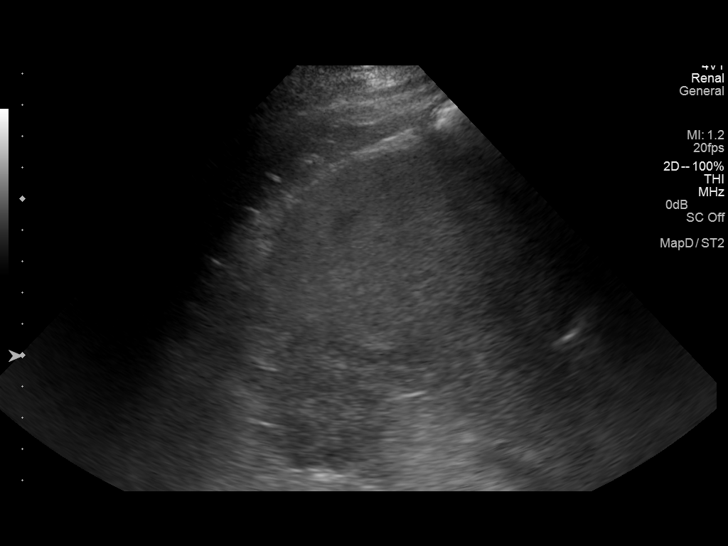
[im 7/21]
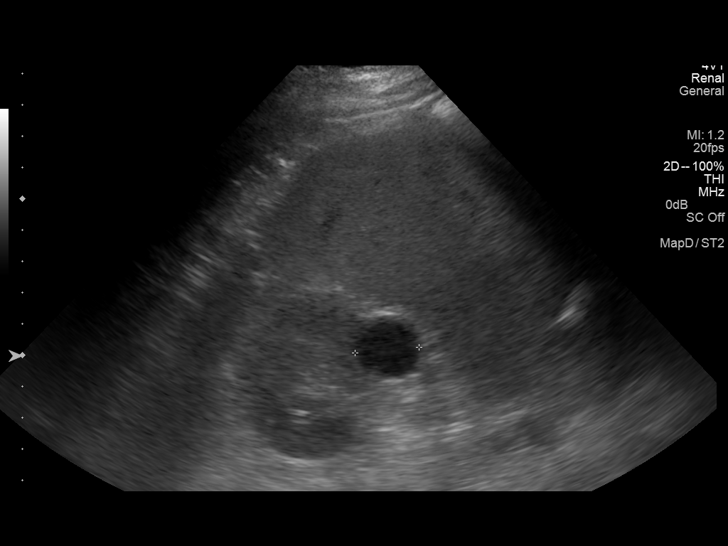
[im 9/21]
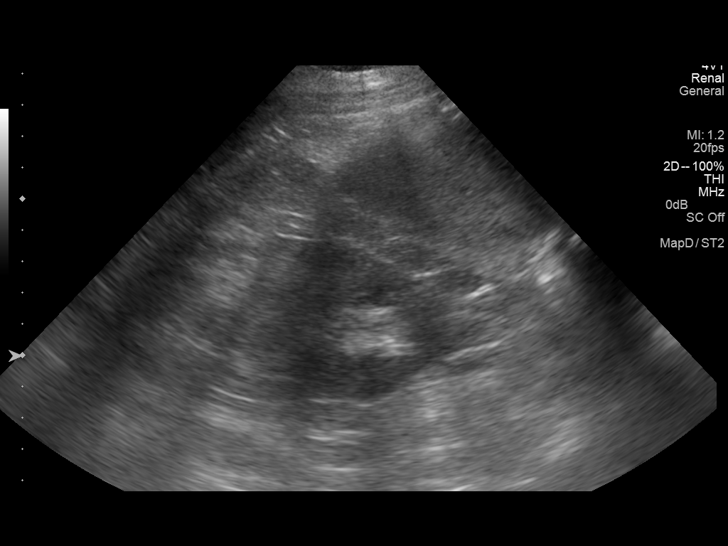
[im 10/21]
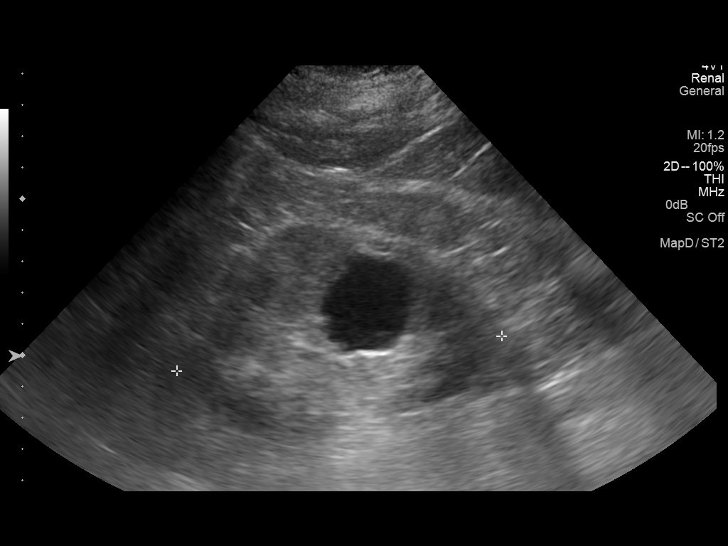
[im 12/21]
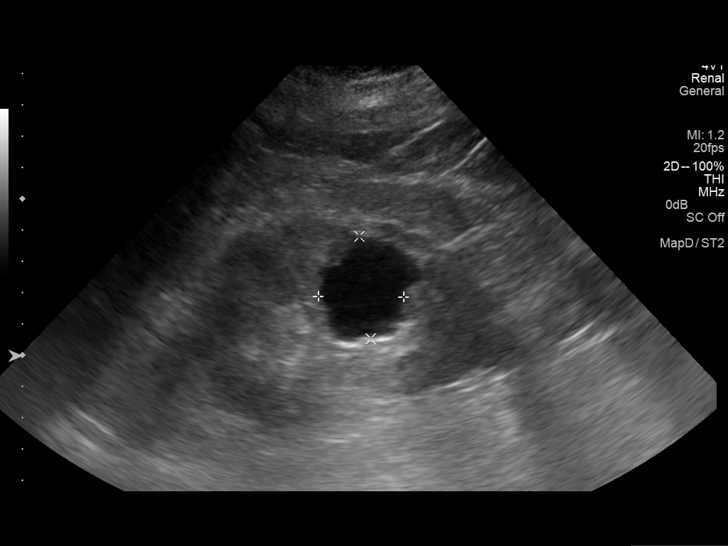
[im 13/21]
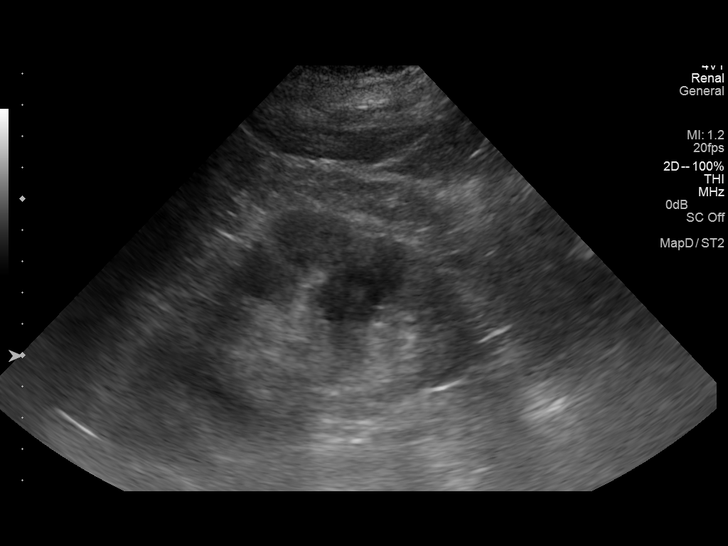
[im 15/21]
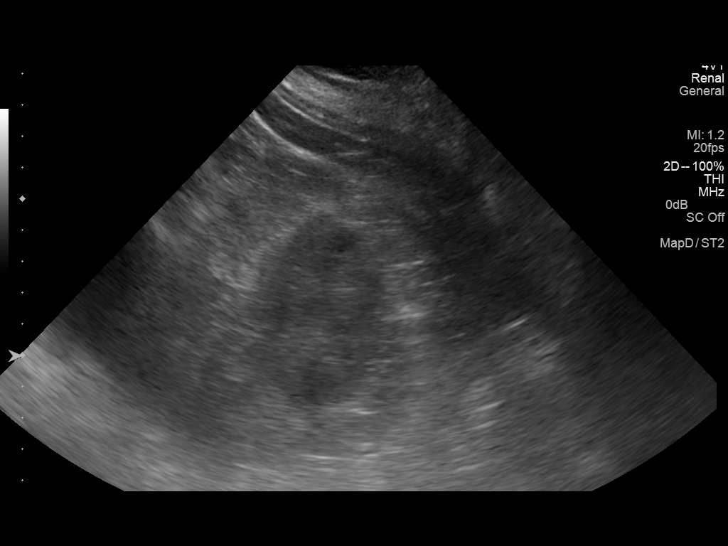
[im 16/21]
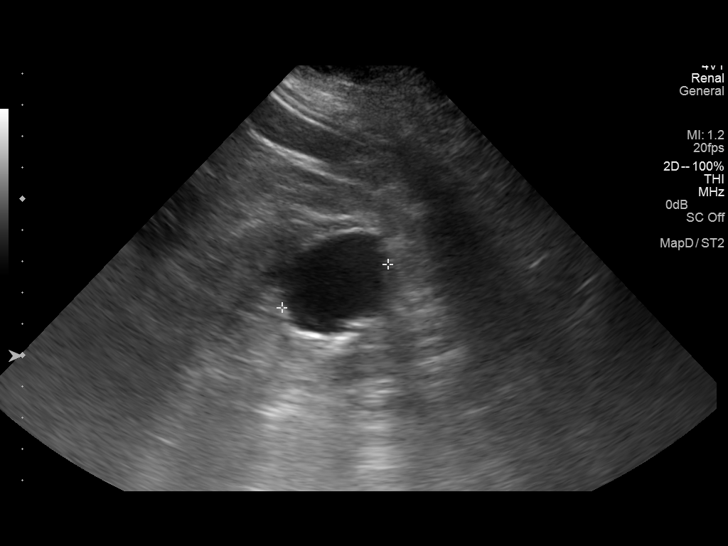
[im 18/21]
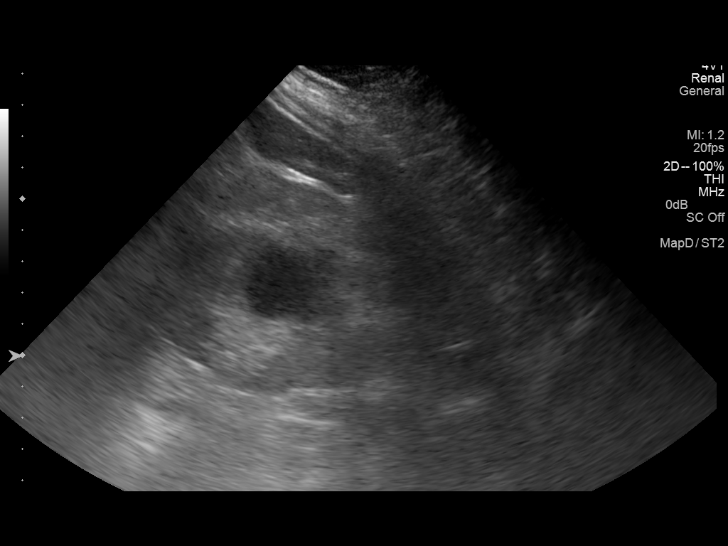
[im 19/21]
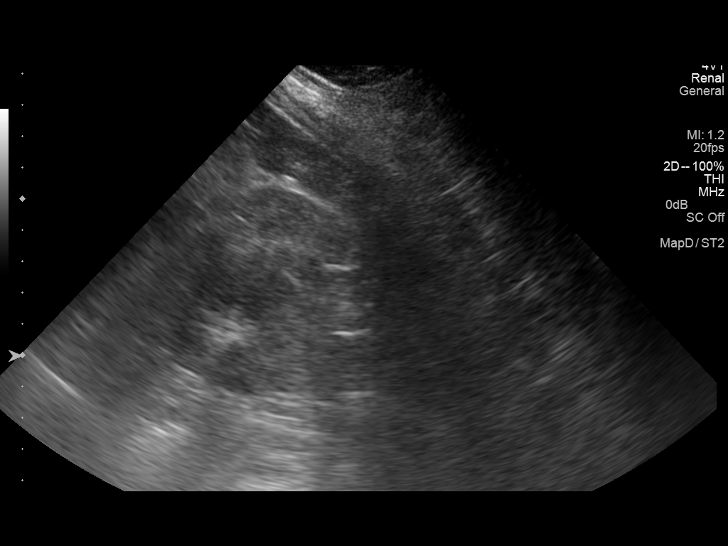
[im 21/21]
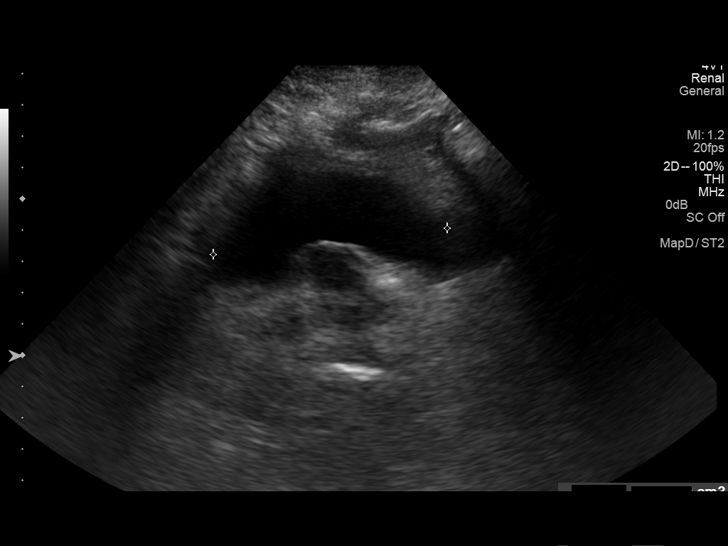

[14 of 21 positions shown; findings below may reference images not displayed]

FINDINGS: Right Kidney:

Length: 12.4 cm length. Cortical thickness up to 1.8 cm thick.
Increased cortical echogenicity. Hypoechoic nodule medial aspect mid
RIGHT kidney 2.1 x 2.2 x 2.1 cm containing scattered internal echoes
question complicated cyst. No additional mass, hydronephrosis or
shadowing calcification.

Left Kidney:

Length: 10.5 cm length.. Cortical thickness up to 2.0 cm thick.
Increased cortical echogenicity. Minimally complicated cyst at mid
LEFT kidney 2.7 x 3.3 x 3.7 cm containing slightly irregular wall
and scattered internal echoes. No additional mass, hydronephrosis or
shadowing calcification.

Bladder:

Contains only minimal urine. Minimally enlarged central lobe of
prostate.
IMPRESSION: Increased renal cortical echogenicity bilaterally consistent with
medical renal disease.

Mildly complicated cysts within the mid portions of both kidneys.

No evidence of hydronephrosis.

## 2015-09-29 NOTE — Telephone Encounter (Signed)
Dr Pilar Jarvis made aware of pre-admit testing labwork results. Per Dr Pilar Jarvis ok to proceed with surgery scheduled 10/05/15.

## 2015-09-30 IMAGING — CR DG CHEST 1V PORT
1 series · 1 of 1 positions shown · non-contrast
Comparison: 01/14/2014

CLINICAL DATA: Ventilator support.  Pneumonia.

EXAM:
PORTABLE CHEST - 1 VIEW

[AP]
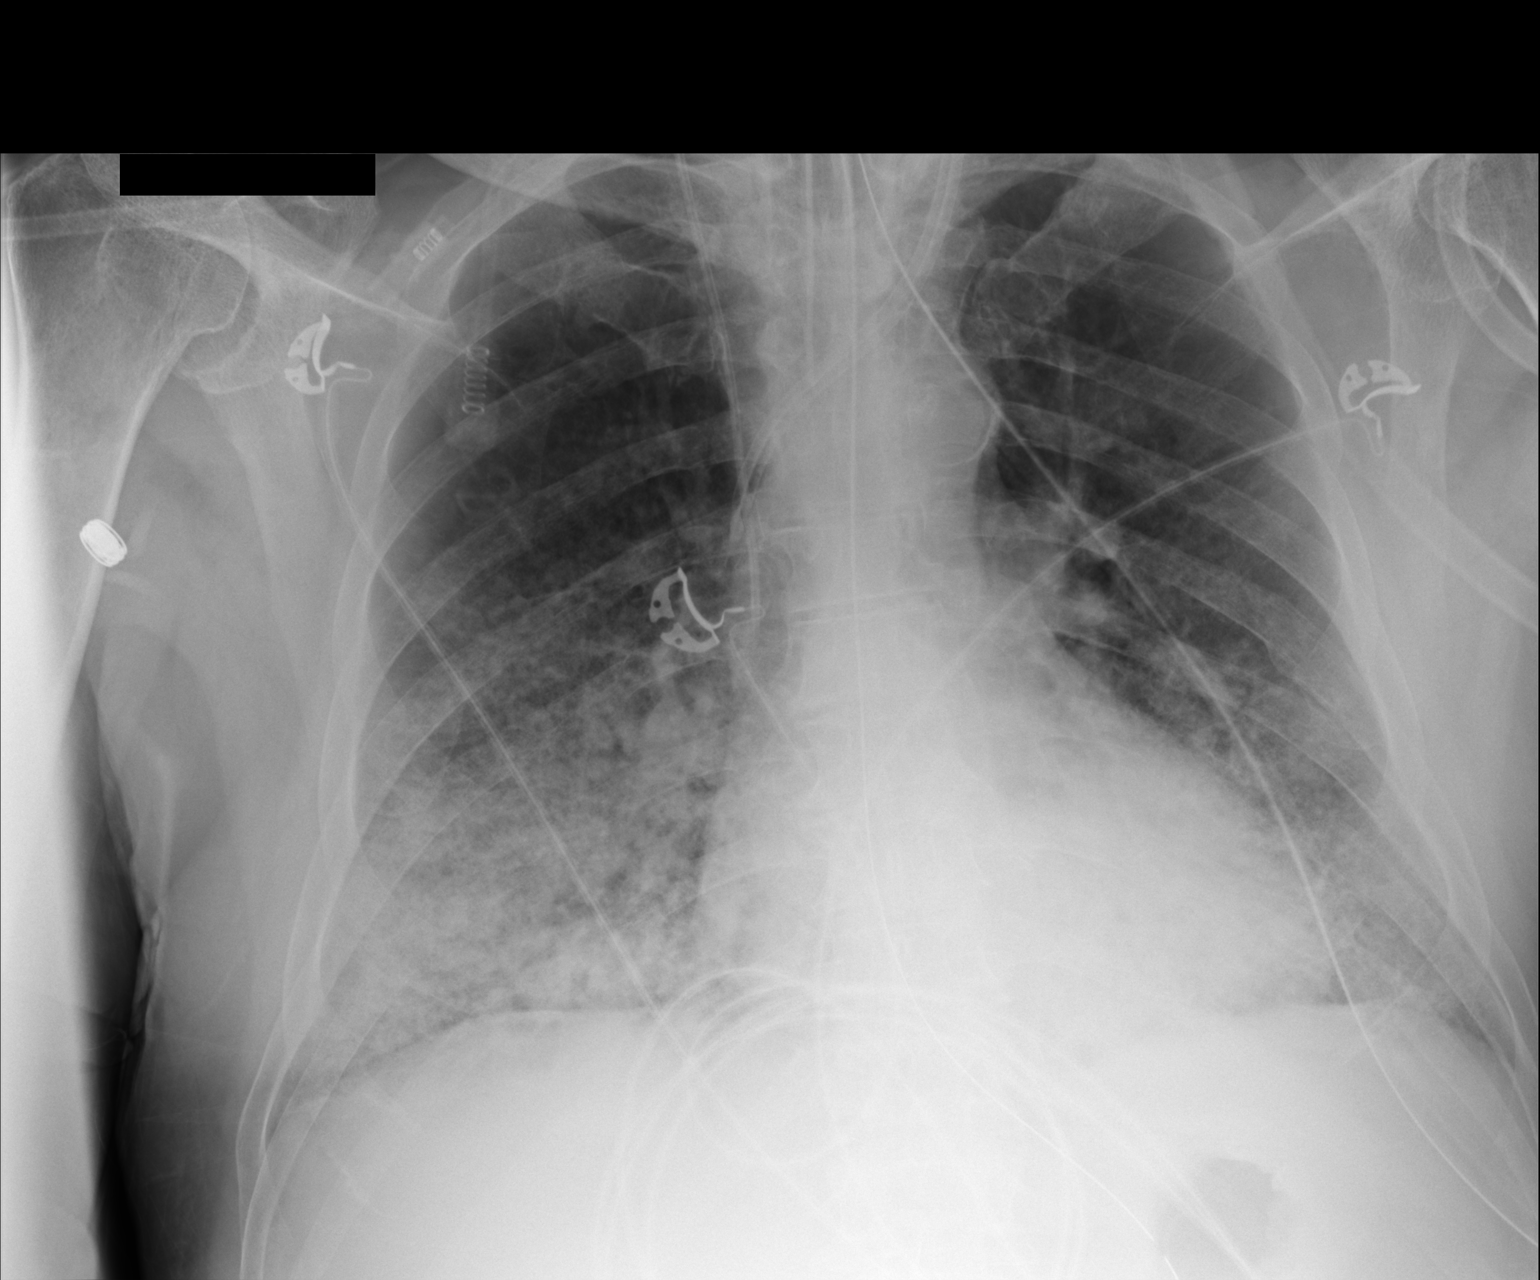

[1 of 1 positions shown; findings below may reference images not displayed]

FINDINGS: Endotracheal tube has its tip 4 cm above the carina. Nasogastric
tube enters the stomach. Right internal jugular central line has its
tip in the SVC above the right atrium. Left internal jugular central
line tips are in the SVC above the right atrium. Bilateral lower
lobe airspace filling consistent with pneumonia persists, probably
slightly improved since yesterday. The upper lungs remain clear. No
effusions.
IMPRESSION: Lines and tubes well positioned. Slight improvement in bilateral
lower lobe pneumonia.

## 2015-10-03 ENCOUNTER — Telehealth: Payer: Self-pay | Admitting: Radiology

## 2015-10-03 ENCOUNTER — Other Ambulatory Visit: Payer: Medicare Other

## 2015-10-03 DIAGNOSIS — N1330 Unspecified hydronephrosis: Secondary | ICD-10-CM

## 2015-10-03 IMAGING — CR DG CHEST 1V PORT
1 series · 1 of 1 positions shown · non-contrast
Comparison: 01/16/2014.

CLINICAL DATA: Check endotracheal tube position.

EXAM:
PORTABLE CHEST - 1 VIEW

[portable]
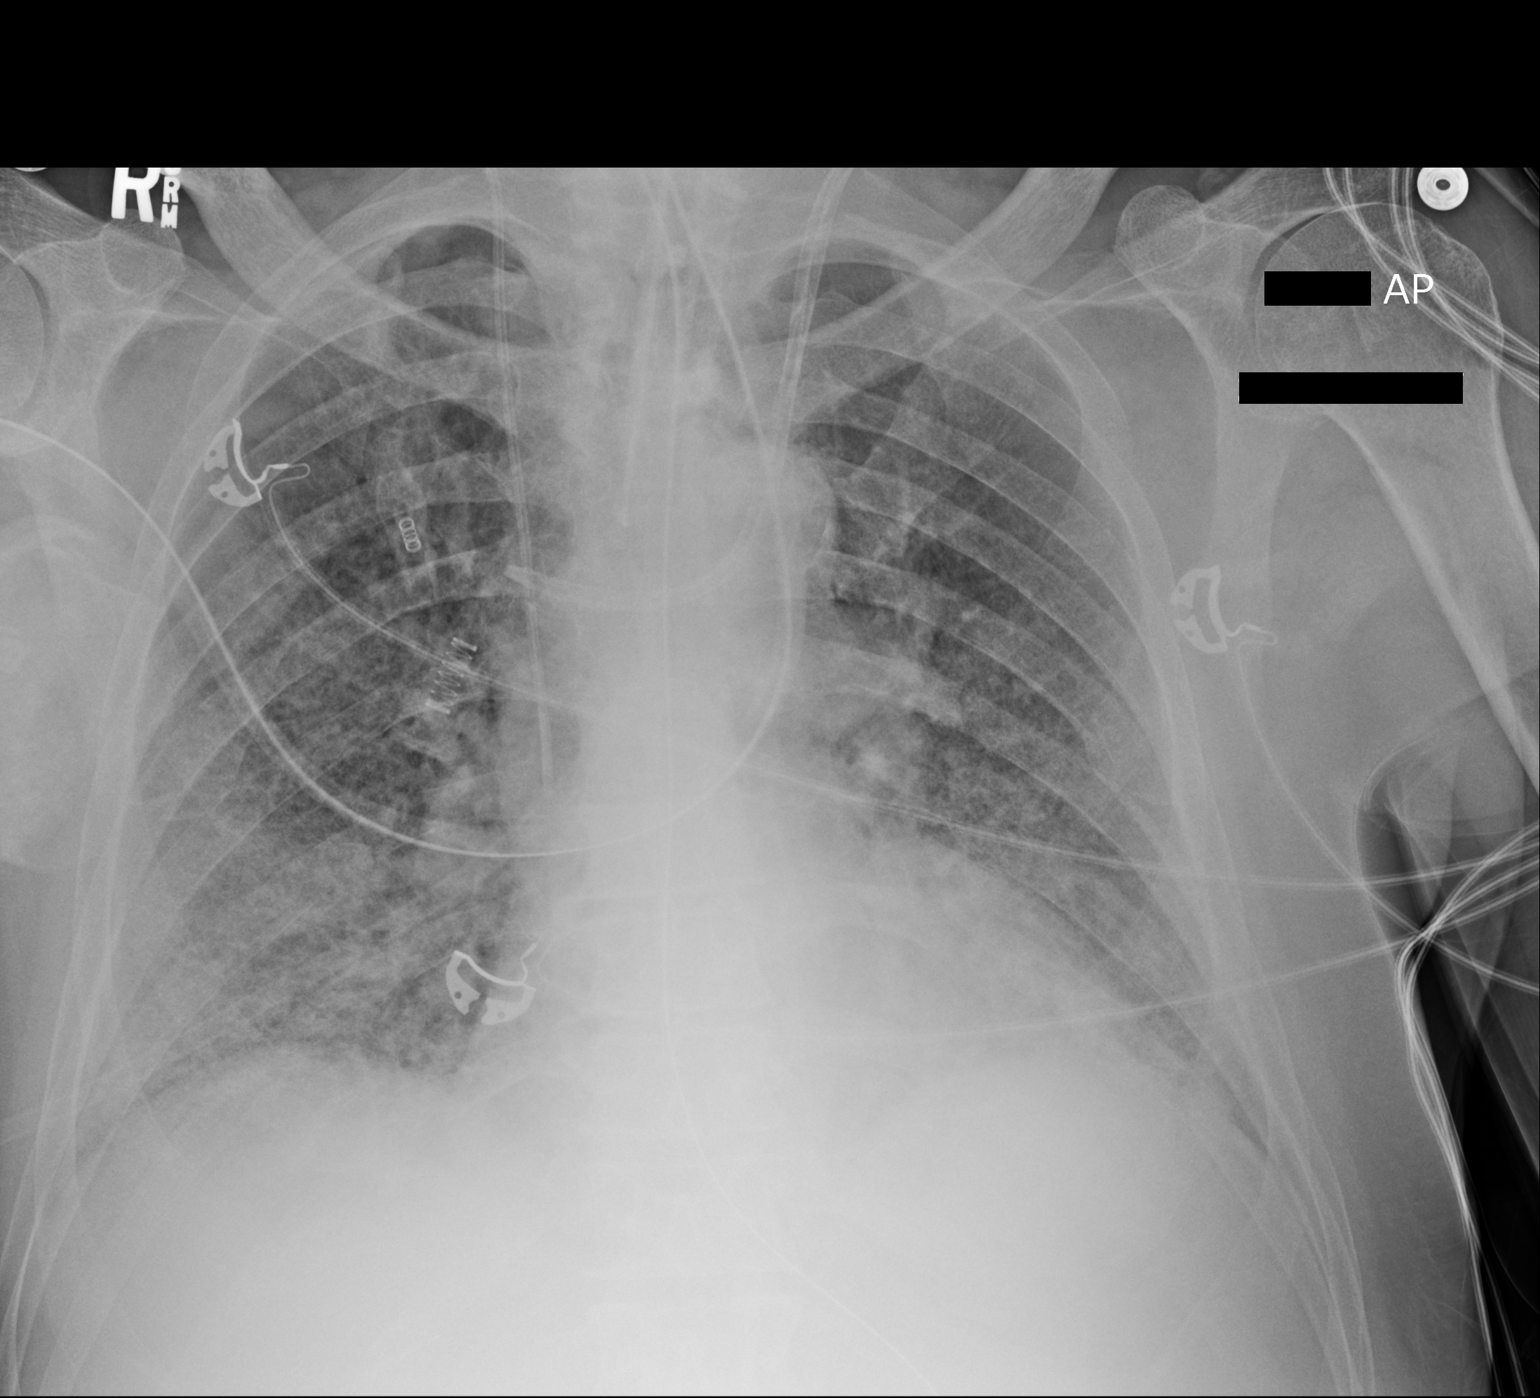

[1 of 1 positions shown; findings below may reference images not displayed]

FINDINGS: Endotracheal tube terminates approximately 3.1 cm above the carina.
Nasogastric tube is followed into the stomach. Right IJ central line
tip projects over the low SVC. Left IJ catheter tip projects over
the brachiocephalic vein confluence or upper SVC. Heart is enlarged,
stable. Thoracic aorta is calcified. There is mild-to-moderate
diffuse bilateral airspace disease with slight improvement in
aeration at the right lung base. No definite pleural fluid.
IMPRESSION: Mild to moderate diffuse bilateral airspace disease, likely due to
pulmonary edema. Slight improvement in aeration at the right lung
base.

## 2015-10-03 NOTE — Pre-Procedure Instructions (Signed)
Amy at Dr Pilar Jarvis office notified regarding urine culture. She state she will contact the patient.

## 2015-10-03 NOTE — Telephone Encounter (Signed)
LMOM. Need pt to RTC asap for ua & urine culture. Pt is scheduled for surgery on 2/15.

## 2015-10-04 LAB — URINALYSIS, COMPLETE
BILIRUBIN UA: NEGATIVE
Glucose, UA: NEGATIVE
KETONES UA: NEGATIVE
Leukocytes, UA: NEGATIVE
Nitrite, UA: NEGATIVE
PH UA: 5.5 (ref 5.0–7.5)
SPEC GRAV UA: 1.02 (ref 1.005–1.030)
UUROB: 0.2 mg/dL (ref 0.2–1.0)

## 2015-10-04 LAB — MICROSCOPIC EXAMINATION
BACTERIA UA: NONE SEEN
RBC, UA: >30 /hpf — AB (ref 0–?)
WBC UA: NONE SEEN /HPF (ref 0–?)

## 2015-10-05 ENCOUNTER — Encounter: Payer: Self-pay | Admitting: *Deleted

## 2015-10-05 ENCOUNTER — Ambulatory Visit
Admission: RE | Admit: 2015-10-05 | Discharge: 2015-10-05 | Disposition: A | Discharge status: Home / Self Care | Payer: Medicare Other | Source: Ambulatory Visit | Attending: Urology | Admitting: Urology

## 2015-10-05 ENCOUNTER — Ambulatory Visit: Payer: Medicare Other | Admitting: Certified Registered Nurse Anesthetist

## 2015-10-05 ENCOUNTER — Encounter
Admission: RE | Disposition: A | Discharge status: Home / Self Care | Payer: Self-pay | Source: Ambulatory Visit | Attending: Urology

## 2015-10-05 DIAGNOSIS — Z87891 Personal history of nicotine dependence: Secondary | ICD-10-CM | POA: Diagnosis not present

## 2015-10-05 DIAGNOSIS — I252 Old myocardial infarction: Secondary | ICD-10-CM | POA: Diagnosis not present

## 2015-10-05 DIAGNOSIS — Z8249 Family history of ischemic heart disease and other diseases of the circulatory system: Secondary | ICD-10-CM | POA: Diagnosis not present

## 2015-10-05 DIAGNOSIS — R109 Unspecified abdominal pain: Secondary | ICD-10-CM | POA: Diagnosis not present

## 2015-10-05 DIAGNOSIS — C7951 Secondary malignant neoplasm of bone: Secondary | ICD-10-CM | POA: Insufficient documentation

## 2015-10-05 DIAGNOSIS — Z79899 Other long term (current) drug therapy: Secondary | ICD-10-CM | POA: Diagnosis not present

## 2015-10-05 DIAGNOSIS — G8929 Other chronic pain: Secondary | ICD-10-CM | POA: Insufficient documentation

## 2015-10-05 DIAGNOSIS — N1330 Unspecified hydronephrosis: Secondary | ICD-10-CM | POA: Diagnosis not present

## 2015-10-05 DIAGNOSIS — C61 Malignant neoplasm of prostate: Secondary | ICD-10-CM | POA: Diagnosis not present

## 2015-10-05 DIAGNOSIS — Z7982 Long term (current) use of aspirin: Secondary | ICD-10-CM | POA: Diagnosis not present

## 2015-10-05 DIAGNOSIS — R011 Cardiac murmur, unspecified: Secondary | ICD-10-CM | POA: Insufficient documentation

## 2015-10-05 DIAGNOSIS — Z9079 Acquired absence of other genital organ(s): Secondary | ICD-10-CM | POA: Insufficient documentation

## 2015-10-05 DIAGNOSIS — N131 Hydronephrosis with ureteral stricture, not elsewhere classified: Secondary | ICD-10-CM | POA: Diagnosis present

## 2015-10-05 DIAGNOSIS — M1990 Unspecified osteoarthritis, unspecified site: Secondary | ICD-10-CM | POA: Diagnosis not present

## 2015-10-05 DIAGNOSIS — I350 Nonrheumatic aortic (valve) stenosis: Secondary | ICD-10-CM | POA: Insufficient documentation

## 2015-10-05 DIAGNOSIS — K219 Gastro-esophageal reflux disease without esophagitis: Secondary | ICD-10-CM | POA: Insufficient documentation

## 2015-10-05 DIAGNOSIS — I4892 Unspecified atrial flutter: Secondary | ICD-10-CM | POA: Diagnosis not present

## 2015-10-05 DIAGNOSIS — I10 Essential (primary) hypertension: Secondary | ICD-10-CM | POA: Insufficient documentation

## 2015-10-05 DIAGNOSIS — E7800 Pure hypercholesterolemia, unspecified: Secondary | ICD-10-CM | POA: Insufficient documentation

## 2015-10-05 DIAGNOSIS — E119 Type 2 diabetes mellitus without complications: Secondary | ICD-10-CM | POA: Insufficient documentation

## 2015-10-05 DIAGNOSIS — Z809 Family history of malignant neoplasm, unspecified: Secondary | ICD-10-CM | POA: Insufficient documentation

## 2015-10-05 HISTORY — PX: CYSTOSCOPY W/ URETERAL STENT PLACEMENT: SHX1429

## 2015-10-05 LAB — CULTURE, URINE COMPREHENSIVE

## 2015-10-05 LAB — GLUCOSE, CAPILLARY: Glucose-Capillary: 135 mg/dL — ABNORMAL HIGH (ref 65–99)

## 2015-10-05 SURGERY — CYSTOSCOPY, FLEXIBLE, WITH STENT REPLACEMENT
Anesthesia: General | Laterality: Left

## 2015-10-05 MED ORDER — PROPOFOL 10 MG/ML IV BOLUS
INTRAVENOUS | Status: DC | PRN
  Administered 2015-10-05: 100 mg via INTRAVENOUS

## 2015-10-05 MED ORDER — FENTANYL CITRATE (PF) 100 MCG/2ML IJ SOLN
INTRAMUSCULAR | Status: DC | PRN
  Administered 2015-10-05: 50 ug via INTRAVENOUS

## 2015-10-05 MED ORDER — FENTANYL CITRATE (PF) 100 MCG/2ML IJ SOLN: 25.0000 ug | INTRAMUSCULAR | Status: DC | PRN

## 2015-10-05 MED ORDER — HYDROCODONE-ACETAMINOPHEN 5-325 MG PO TABS: 1.0000 | ORAL_TABLET | ORAL | Status: DC | PRN

## 2015-10-05 MED ORDER — CEPHALEXIN 500 MG PO CAPS: 500.0000 mg | ORAL_CAPSULE | Freq: Three times a day (TID) | ORAL | Status: DC

## 2015-10-05 MED ORDER — LIDOCAINE HCL (CARDIAC) 20 MG/ML IV SOLN
INTRAVENOUS | Status: DC | PRN
  Administered 2015-10-05: 100 mg via INTRAVENOUS

## 2015-10-05 MED ORDER — CEFAZOLIN SODIUM-DEXTROSE 2-3 GM-% IV SOLR
INTRAVENOUS | Status: AC
  Administered 2015-10-05: 2 g via INTRAVENOUS
  Filled 2015-10-05: qty 50

## 2015-10-05 MED ORDER — ONDANSETRON HCL 4 MG/2ML IJ SOLN: 4.0000 mg | Freq: Once | INTRAMUSCULAR | Status: DC | PRN

## 2015-10-05 MED ORDER — SODIUM CHLORIDE 0.9 % IV SOLN
INTRAVENOUS | Status: DC
  Administered 2015-10-05: 10:00:00 via INTRAVENOUS

## 2015-10-05 MED ORDER — CEFAZOLIN SODIUM-DEXTROSE 2-3 GM-% IV SOLR
2.0000 g | Freq: Three times a day (TID) | INTRAVENOUS | Status: DC
  Administered 2015-10-05: 2 g via INTRAVENOUS

## 2015-10-05 SURGICAL SUPPLY — 24 items
BACTOSHIELD CHG 4% 4OZ (MISCELLANEOUS) ×2
CATH URETL 5X70 OPEN END (CATHETERS) ×3 IMPLANT
CONRAY 43 FOR UROLOGY 50M (MISCELLANEOUS) ×3 IMPLANT
GLOVE BIO SURGEON STRL SZ7 (GLOVE) ×6 IMPLANT
GLOVE BIO SURGEON STRL SZ7.5 (GLOVE) ×3 IMPLANT
GOWN STRL REUS W/ TWL LRG LVL3 (GOWN DISPOSABLE) ×1 IMPLANT
GOWN STRL REUS W/ TWL LRG LVL4 (GOWN DISPOSABLE) ×1 IMPLANT
GOWN STRL REUS W/TWL LRG LVL3 (GOWN DISPOSABLE) ×3
GOWN STRL REUS W/TWL LRG LVL4 (GOWN DISPOSABLE) ×2
GOWN STRL REUS W/TWL XL LVL3 (GOWN DISPOSABLE) ×3 IMPLANT
KIT RM TURNOVER CYSTO AR (KITS) ×3 IMPLANT
PACK CYSTO AR (MISCELLANEOUS) ×3 IMPLANT
SCRUB CHG 4% DYNA-HEX 4OZ (MISCELLANEOUS) ×1 IMPLANT
SENSORWIRE 0.038 NOT ANGLED (WIRE) ×3
SET CYSTO W/LG BORE CLAMP LF (SET/KITS/TRAYS/PACK) ×3 IMPLANT
SOL .9 NS 3000ML IRR  AL (IV SOLUTION) ×2
SOL .9 NS 3000ML IRR AL (IV SOLUTION) ×1
SOL .9 NS 3000ML IRR UROMATIC (IV SOLUTION) ×1 IMPLANT
STENT URET 6FRX24 CONTOUR (STENTS) IMPLANT
STENT URET 6FRX26 CONTOUR (STENTS) ×3 IMPLANT
SURGILUBE 2OZ TUBE FLIPTOP (MISCELLANEOUS) ×3 IMPLANT
WATER STERILE IRR 1000ML POUR (IV SOLUTION) ×3 IMPLANT
WIRE SENSOR 0.038 NOT ANGLED (WIRE) ×1 IMPLANT
ureteral stent ×3 IMPLANT

## 2015-10-05 NOTE — Op Note (Signed)
Date of procedure: 10/05/2015  Preoperative diagnosis:  1. Left hydronephrosis 2. Stage IV metastatic prostate cancer   Postoperative diagnosis:  1. Left hydronephrosis 2. Stage IV metastatic prostate cancer   Procedure: 1. Cystoscopy 2. Left ureteral stent exchange 6 [place] by 26 cm  Surgeon: Baruch Gouty, MD  Anesthesia: General  Complications: None  Intraoperative findings: The stent was confirmed to be in the correct location on fluoroscopy.  EBL: None  Specimens: None  Drains: 6 French by 26 cm double-J left ureteral stent  Disposition: Stable to the postanesthesia care unit  Indication for procedure: The patient is a 75 y.o. male with stage IV metastatic prostate cancer with left hydroureteronephrosis presents today for left ureteral stent exchange.  After reviewing the management options for treatment, the patient elected to proceed with the above surgical procedure(s). We have discussed the potential benefits and risks of the procedure, side effects of the proposed treatment, the likelihood of the patient achieving the goals of the procedure, and any potential problems that might occur during the procedure or recuperation. Informed consent has been obtained.  Description of procedure: The patient was met in the preoperative area. All risks, benefits, and indications of the procedure were described in great detail. The patient consented to the procedure. Preoperative antibiotics were given. The patient was taken to the operative theater. General anesthesia was induced per the anesthesia service. The patient was then placed in the dorsal lithotomy position and prepped and draped in the usual sterile fashion. A preoperative timeout was called. A 21 French 30 cystoscope was inserted into the patient's bladder per urethra atraumatically. The left ureteral stent was grasped flexible graspers brought to level urethral meatus. Prior to this though, fluoroscopy was used to locate  the level of the renal pelvis via stent curl. Sensor was exchanged left ureteral stent up to level the renal pelvis and the stent was removed. The cystoscope was reassembled over the sensor wire inserted back into the patient's bladder. A 6 French by 26 cm double-J ureteral stent was then placed over the sensor wire and advanced to thelevel of the renal pelvis under fluoroscopy. Thesensor wire was removed. A curl seen was seen in the patient's left renal pelvis indicating proximal correct placement on fluoroscopy. The correct distal placement of the stent was confirmed with direct position within the patient's urinary bladder.  There was clear yellow urine draining from the stent further confirming correct placement. The patient's bladder was then drained. The patient was woke from anesthesia and transferred to postanesthesia care unit without apparent complication.  Plan: The patient will follow-up in 3 months for repeat stent exchange. His metastatic stage IV prostate cancer is managed via Lupron by medical oncology.  Baruch Gouty, M.D.

## 2015-10-05 NOTE — Discharge Instructions (Signed)

## 2015-10-05 NOTE — Anesthesia Postprocedure Evaluation (Signed)
Anesthesia Post Note  Patient: [name]  Procedure(s) Performed: Procedure(s) (LRB): CYSTOSCOPY WITH STENT REPLACEMENT (Left)  Patient location during evaluation: PACU Anesthesia Type: General Level of consciousness: awake and alert Pain management: pain level controlled Vital Signs Assessment: post-procedure vital signs reviewed and stable Respiratory status: spontaneous breathing, nonlabored ventilation, respiratory function stable and patient connected to nasal cannula oxygen Cardiovascular status: blood pressure returned to baseline and stable Postop Assessment: no signs of nausea or vomiting Anesthetic complications: no    Last Vitals:  Filed Vitals:   10/05/15 1118 10/05/15 1135  BP: 137/71 135/70  Pulse: 59 63  Temp: 36.1 C 36.1 C  Resp: 16 16    Last Pain:  Filed Vitals:   10/05/15 1136  PainSc: 0-No pain                 Martha Clan

## 2015-10-05 NOTE — Interval H&P Note (Signed)
History and Physical Interval Note:  10/05/2015 9:20 AM  [name]  has presented today for surgery, with the diagnosis of prostate cancer  The various methods of treatment have been discussed with the patient and family. After consideration of risks, benefits and other options for treatment, the patient has consented to  Procedure(s): CYSTOSCOPY WITH STENT REPLACEMENT (Left) CYSTOSCOPY WITH RETROGRADE PYELOGRAM (Left) as a surgical intervention .  The patient's history has been reviewed, patient examined, no change in status, stable for surgery.  I have reviewed the patient's chart and labs.  Questions were answered to the patient's satisfaction.    RRR Unlabored respirations  Horald Pollen [place]

## 2015-10-05 NOTE — Anesthesia Procedure Notes (Signed)
Procedure Name: LMA Insertion Date/Time: 10/05/2015 10:18 AM Performed by: Rosaria Ferries, Chairty Toman Pre-anesthesia Checklist: Patient identified, Emergency Drugs available, Suction available and Patient being monitored Patient Re-evaluated:Patient Re-evaluated prior to inductionOxygen Delivery Method: Circle system utilized Preoxygenation: Pre-oxygenation with 100% oxygen Intubation Type: IV induction LMA Size: 5.0 Placement Confirmation: breath sounds checked- equal and bilateral Dental Injury: Teeth and Oropharynx as per pre-operative assessment

## 2015-10-05 NOTE — Anesthesia Preprocedure Evaluation (Addendum)
Anesthesia Evaluation  Patient identified by MRN, date of birth, ID band Patient awake    Reviewed: Allergy & Precautions, NPO status , Patient's Chart, lab work & pertinent test results, reviewed documented beta blocker date and time   History of Anesthesia Complications Negative for: history of anesthetic complications  Airway Mallampati: III  TM Distance: <3 FB Neck ROM: Limited    Dental  (+) Upper Dentures, Lower Dentures   Pulmonary neg shortness of breath, neg sleep apnea, pneumonia, resolved, neg COPD, neg recent URI, former smoker,    Pulmonary exam normal breath sounds clear to auscultation       Cardiovascular hypertension, Pt. on medications and Pt. on home beta blockers + Past MI and +CHF  Normal cardiovascular exam+ Valvular Problems/Murmurs AS      Neuro/Psych negative neurological ROS  negative psych ROS   GI/Hepatic GERD  ,dysphagia   Endo/Other  diabetes, Well Controlled, Type 2  Renal/GU Renal diseaseHx of ARF     Musculoskeletal  (+) Arthritis , Osteoarthritis,    Abdominal Normal abdominal exam  (+)   Peds  Hematology  (+) anemia , thrombocytopenia   Anesthesia Other Findings   Reproductive/Obstetrics                            Anesthesia Physical  Anesthesia Plan  ASA: III  Anesthesia Plan: General   Post-op Pain Management:    Induction: Intravenous  Airway Management Planned: Oral ETT  Additional Equipment:   Intra-op Plan:   Post-operative Plan: Extubation in OR  Informed Consent: I have reviewed the patients History and Physical, chart, labs and discussed the procedure including the risks, benefits and alternatives for the proposed anesthesia with the patient or authorized representative who has indicated his/her understanding and acceptance.   Dental advisory given  Plan Discussed with: CRNA, Surgeon and Anesthesiologist  Anesthesia Plan  Comments:        Anesthesia Quick Evaluation

## 2015-10-05 NOTE — H&P (View-Only) (Signed)
09/16/2015 3:21 PM   [name] 12-May-1941 [id number]  Referring provider: Tamsen Roers, MD [address], [place] [address]  Chief Complaint  Patient presents with  . Pre-op Exam    Hydronephrosis with ureteral stricture    HPI: The patient is a 75 year old gentleman with a known past medical history of stage IV metastatic prostate cancer. He presents today with left hydroureteronephrosis. This was seen on both the recent CT scan and bone scan. He also has what appears to be a mass in his prostatics bed and possibly growing into his bladder. He had a radical prostatectomy in 1992. We do not have records from this procedure. The patient does not remember where this was done. He saw Dr. Elnoria Howard a few weeks ago and noted a large firm mass at the base the bladder. He now has what appears to be this newfound left hydronephrosis. His creatinine is 1.6. He sees Dr. Leia Alf recently started him on Lupron. His most recent PSA prior to Lupron was 48.31 month ago.  January 2017 Interval history: The patient underwent a antegrade left ureteral stent placement for his left hydronephrosis after unsuccessful attempt at placing a left ureteral stent. He presented today to discuss management of his stent as he is due for change. He did have a recent renal ultrasound for left flank pain that showed the stent in place draining well. It did show a possible stone on the right side 9 mm in side. However he did get a CT scan to 6 months ago which was showed 1-2 mm punctate stones so I feel that this is a shadow not a true stone. He also has chronic pain from his metastatic prostate cancer. He is on tramadol amount is out of pills. He states it does not work for him at this time.   PMH: Past Medical History  Diagnosis Date  . Diabetes mellitus without complication ([place])   . Hypertension   . Hypercholesteremia   . GERD (gastroesophageal reflux disease)   . Prostate cancer ([place])     metastatic  .  Arthritis   . Heart murmur   . Aortic stenosis, moderate 01/30/2014  . Atrial flutter ([place]) 02/02/2014  . HTN (hypertension) 01/30/2014  . NSTEMI (non-ST elevated myocardial infarction) ([place]) 01/30/2014    Surgical History: Past Surgical History  Procedure Laterality Date  . Prostatectomy  1994  . Colon resection    . Eye surgery    . Cystoscopy w/ retrogrades Left 06/08/2015    Procedure: CYSTOSCOPY WITH RETROGRADE PYELOGRAM;  Surgeon: Nickie Retort, MD;  Location: [facility] ORS;  Service: Urology;  Laterality: Left;    Home Medications:    Medication List       This list is accurate as of: 09/16/15  3:21 PM.  Always use your most recent med list.               allopurinol 300 MG tablet  Commonly known as:  ZYLOPRIM  Take 300 mg by mouth daily.     aspirin 81 MG chewable tablet  Chew 1 tablet (81 mg total) by mouth daily.     atorvastatin 10 MG tablet  Commonly known as:  LIPITOR     carvedilol 6.25 MG tablet  Commonly known as:  COREG  Take 1 tablet (6.25 mg total) by mouth 2 (two) times daily with a meal.     ferrous sulfate 325 (65 FE) MG tablet  Take 325 mg by mouth 2 (  two) times daily with a meal.     lovastatin 20 MG tablet  Commonly known as:  MEVACOR  Take 20 mg by mouth daily. Reported on 09/16/2015     oxybutynin 5 MG tablet  Commonly known as:  DITROPAN  Take 5 mg by mouth 4 (four) times daily.     pantoprazole 40 MG tablet  Commonly known as:  PROTONIX  Take 1 tablet (40 mg total) by mouth daily before breakfast.     ranitidine 150 MG tablet  Commonly known as:  ZANTAC  Take 150 mg by mouth 2 (two) times daily.     traMADol 50 MG tablet  Commonly known as:  ULTRAM  Take 1 tablet (50 mg total) by mouth 2 (two) times daily as needed.     verapamil 240 MG CR tablet  Commonly known as:  CALAN-SR  Take 1 tablet (240 mg total) by mouth daily.     VITAMIN B 12 PO  Take 1,000 mcg by mouth daily.        Allergies: No Known Allergies  Family  History: Family History  Problem Relation Age of Onset  . Aneurysm Father   . Cancer Mother     Social History:  reports that he quit smoking about 41 years ago. His smoking use included Cigarettes. He has a 37.5 pack-year smoking history. He has never used smokeless tobacco. He reports that he does not drink alcohol or use illicit drugs.  ROS: UROLOGY Frequent Urination?: No Hard to postpone urination?: No Burning/pain with urination?: No Get up at night to urinate?: Yes Leakage of urine?: Yes Urine stream starts and stops?: No Trouble starting stream?: No Do you have to strain to urinate?: No Blood in urine?: No Urinary tract infection?: No Sexually transmitted disease?: No Injury to kidneys or bladder?: No Painful intercourse?: No Weak stream?: No Erection problems?: No Penile pain?: No  Gastrointestinal Nausea?: No Vomiting?: No Indigestion/heartburn?: No Diarrhea?: No Constipation?: No  Constitutional Fever: No Night sweats?: No Weight loss?: No Fatigue?: No  Skin Skin rash/lesions?: No Itching?: No  Eyes Blurred vision?: No Double vision?: No  Ears/Nose/Throat Sore throat?: No Sinus problems?: No  Hematologic/Lymphatic Swollen glands?: No Easy bruising?: No  Cardiovascular Leg swelling?: No Chest pain?: No  Respiratory Cough?: No Shortness of breath?: No  Endocrine Excessive thirst?: Yes  Musculoskeletal Back pain?: Yes Joint pain?: No  Neurological Headaches?: No Dizziness?: No  Psychologic Depression?: No Anxiety?: No  Physical Exam: BP 124/66 mmHg  Pulse 69  Ht 5\' 11"  (1.803 m)  Wt 214 lb 14.4 oz (97.478 kg)  BMI 29.99 kg/m2  Constitutional:  Alert and oriented, No acute distress. HEENT: [place] AT, moist mucus membranes.  Trachea midline, no masses. Cardiovascular: No clubbing, cyanosis, or edema. Respiratory: Normal respiratory effort, no increased work of breathing. GI: Abdomen is soft, nontender, nondistended, no  abdominal masses GU: No CVA tenderness. Skin: No rashes, bruises or suspicious lesions. Lymph: No cervical or inguinal adenopathy. Neurologic: Grossly intact, no focal deficits, moving all 4 extremities. Psychiatric: Normal mood and affect.  Laboratory Data: Lab Results  Component Value Date   WBC 10.1 08/01/2015   HGB 13.6 08/01/2015   HCT 41.4 08/01/2015   MCV 87.1 08/01/2015   PLT 183 08/01/2015    Lab Results  Component Value Date   CREATININE 1.40* 08/01/2015    Lab Results  Component Value Date   PSA 1.39 07/04/2015   PSA 48.22* 03/24/2015    Lab Results  Component Value  Date   TESTOSTERONE 228* 03/24/2015    Lab Results  Component Value Date   HGBA1C 5.5 01/30/2014    Urinalysis    Component Value Date/Time   COLORURINE YELLOW 01/13/2014 2146   APPEARANCEUR CLOUDY* 01/13/2014 2146   LABSPEC 1.017 01/13/2014 2146   PHURINE 5.0 01/13/2014 2146   GLUCOSEU Negative 05/10/2015 1335   HGBUR LARGE* 01/13/2014 2146   BILIRUBINUR Negative 05/10/2015 1335   BILIRUBINUR NEGATIVE 01/13/2014 2146   KETONESUR NEGATIVE 01/13/2014 2146   PROTEINUR 100* 01/13/2014 2146   UROBILINOGEN 0.2 01/13/2014 2146   NITRITE Negative 05/10/2015 1335   NITRITE NEGATIVE 01/13/2014 2146   LEUKOCYTESUR Negative 05/10/2015 1335   LEUKOCYTESUR NEGATIVE 01/13/2014 2146     Assessment & Plan:     1. Left Hydroureteronephrosis The patient is due for a left ureteral stent exchange. Discussed with the patient the risks, benefits, indications of the procedure. He understands that the goal being to change the stent using the procedure placed left antegrade ureteral stent. He understands if access is lost that he will need a repeat antegrade ureteral stent placement in interventional radiology. He agrees to this procedure and elects to proceed.  2. Metastatic prostate cancer - Stage IV with metastases to lumbar spine, pelvic lymphadenopathy  He is under the care of Dr. Rogue Bussing  on  Lupron and Delton See for bone mets. We will defer further management to medical oncology.   3. Chronic pain I prescribed the patient Norco 12/21/2023 milligrams every 4 hours with no refills. I instructed the patient that further pain medication should come from the cancer center as they are managing his metastatic disease.  No Follow-up on file.  Nickie Retort, MD  [facility] [address] [place], [place] [address] [phone]

## 2015-10-05 NOTE — Transfer of Care (Signed)
Immediate Anesthesia Transfer of Care Note  Patient: [name]  Procedure(s) Performed: Procedure(s): CYSTOSCOPY WITH STENT REPLACEMENT (Left)  Patient Location: PACU  Anesthesia Type:General  Level of Consciousness: awake, alert , oriented and patient cooperative  Airway & Oxygen Therapy: Patient Spontanous Breathing and Patient connected to nasal cannula oxygen  Post-op Assessment: Report given to RN and Post -op Vital signs reviewed and stable  Post vital signs: Reviewed and stable  Last Vitals:  Filed Vitals:   10/05/15 0914  BP: 154/85  Pulse: 74  Temp: 36.9 C  Resp: 16    Complications: No apparent anesthesia complications

## 2015-10-07 ENCOUNTER — Encounter: Payer: Self-pay | Admitting: Urology

## 2015-10-17 ENCOUNTER — Telehealth: Payer: Self-pay | Admitting: Urology

## 2015-10-17 NOTE — Telephone Encounter (Signed)
Pt needs refill on pain medication.  He wasn't aware of name, but it has codeine and tylenol in it.   He ran out yesterday.  Can we refill this? Please call pt [phone]

## 2015-10-18 ENCOUNTER — Telehealth: Payer: Self-pay | Admitting: Urology

## 2015-10-18 NOTE — Telephone Encounter (Signed)
Patient called today requesting a refill of the pain medication.  Patient had surgery on 10/05/15.

## 2015-10-18 NOTE — Telephone Encounter (Signed)
Spoke with pt in reference to pain medication. Per Dr. Carlynn Purl last note pt was to receive further pain medication from cancer center. Made pt aware of this. Pt voiced understanding.

## 2015-10-19 NOTE — Telephone Encounter (Signed)
Spoke with pt and made aware he needed to call the cancer center to get pain medication. Pt stated cancer center advised him to get pain medication from urology. Spoke with Nira Conn at cancer center who stated that pt should be seen due to pain. Therefore an appt was made for pt tomorrow at 1:45. Spoke with pt again and made aware Dr. B would like to see him tomorrow to reevaluate his pain. Pt was confused and didn't understanding what I was telling him. Pt will have wife call once she returns.

## 2015-10-19 NOTE — Telephone Encounter (Signed)
Patient needs to have his chronic pain medication filled by his oncologist (he has metastatic prostate cancer). I told him and his family at the time of surgery that I would only give him enough to give him time to contact his oncologist. We can give him norco 5/325 mg q4hr. Disp: 20, but he must know that he will not be getting further refills from [place].

## 2015-10-20 ENCOUNTER — Inpatient Hospital Stay: Payer: Medicare Other

## 2015-10-20 ENCOUNTER — Encounter: Payer: Self-pay | Admitting: Internal Medicine

## 2015-10-20 ENCOUNTER — Inpatient Hospital Stay: Payer: Medicare Other | Attending: Internal Medicine | Admitting: Internal Medicine

## 2015-10-20 VITALS — BP 178/90 | HR 64 | Temp 98.1°F | Resp 18 | Ht 71.0 in | Wt 202.0 lb

## 2015-10-20 DIAGNOSIS — E7800 Pure hypercholesterolemia, unspecified: Secondary | ICD-10-CM

## 2015-10-20 DIAGNOSIS — R011 Cardiac murmur, unspecified: Secondary | ICD-10-CM | POA: Diagnosis not present

## 2015-10-20 DIAGNOSIS — Z87891 Personal history of nicotine dependence: Secondary | ICD-10-CM | POA: Diagnosis not present

## 2015-10-20 DIAGNOSIS — C61 Malignant neoplasm of prostate: Secondary | ICD-10-CM

## 2015-10-20 DIAGNOSIS — M129 Arthropathy, unspecified: Secondary | ICD-10-CM | POA: Diagnosis not present

## 2015-10-20 DIAGNOSIS — M25552 Pain in left hip: Secondary | ICD-10-CM

## 2015-10-20 DIAGNOSIS — Z7982 Long term (current) use of aspirin: Secondary | ICD-10-CM | POA: Diagnosis not present

## 2015-10-20 DIAGNOSIS — K219 Gastro-esophageal reflux disease without esophagitis: Secondary | ICD-10-CM | POA: Insufficient documentation

## 2015-10-20 DIAGNOSIS — C7951 Secondary malignant neoplasm of bone: Secondary | ICD-10-CM | POA: Insufficient documentation

## 2015-10-20 DIAGNOSIS — I252 Old myocardial infarction: Secondary | ICD-10-CM | POA: Diagnosis not present

## 2015-10-20 DIAGNOSIS — N1330 Unspecified hydronephrosis: Secondary | ICD-10-CM | POA: Diagnosis not present

## 2015-10-20 DIAGNOSIS — I350 Nonrheumatic aortic (valve) stenosis: Secondary | ICD-10-CM | POA: Insufficient documentation

## 2015-10-20 DIAGNOSIS — Z79899 Other long term (current) drug therapy: Secondary | ICD-10-CM | POA: Insufficient documentation

## 2015-10-20 DIAGNOSIS — I10 Essential (primary) hypertension: Secondary | ICD-10-CM | POA: Diagnosis not present

## 2015-10-20 DIAGNOSIS — E119 Type 2 diabetes mellitus without complications: Secondary | ICD-10-CM | POA: Diagnosis not present

## 2015-10-20 DIAGNOSIS — I4892 Unspecified atrial flutter: Secondary | ICD-10-CM | POA: Diagnosis not present

## 2015-10-20 LAB — CBC
HEMATOCRIT: 42 % (ref 40.0–52.0)
HEMOGLOBIN: 13.9 g/dL (ref 13.0–18.0)
MCH: 29.2 pg (ref 26.0–34.0)
MCHC: 33.1 g/dL (ref 32.0–36.0)
MCV: 88 fL (ref 80.0–100.0)
Platelets: 207 10*3/uL (ref 150–440)
RBC: 4.77 MIL/uL (ref 4.40–5.90)
RDW: 15.5 % — ABNORMAL HIGH (ref 11.5–14.5)
WBC: 11.5 10*3/uL — ABNORMAL HIGH (ref 3.8–10.6)

## 2015-10-20 LAB — COMPREHENSIVE METABOLIC PANEL
ALK PHOS: 63 U/L (ref 38–126)
ALT: 14 U/L — AB (ref 17–63)
AST: 20 U/L (ref 15–41)
Albumin: 4.2 g/dL (ref 3.5–5.0)
Anion gap: 5 (ref 5–15)
BILIRUBIN TOTAL: 0.5 mg/dL (ref 0.3–1.2)
BUN: 24 mg/dL — ABNORMAL HIGH (ref 6–20)
CALCIUM: 9.8 mg/dL (ref 8.9–10.3)
CO2: 25 mmol/L (ref 22–32)
CREATININE: 1.35 mg/dL — AB (ref 0.61–1.24)
Chloride: 102 mmol/L (ref 101–111)
GFR calc non Af Amer: 50 mL/min — ABNORMAL LOW (ref >60)
GFR, EST AFRICAN AMERICAN: 58 mL/min — AB (ref >60)
Glucose, Bld: 138 mg/dL — ABNORMAL HIGH (ref 65–99)
Potassium: 4.3 mmol/L (ref 3.5–5.1)
SODIUM: 132 mmol/L — AB (ref 135–145)
TOTAL PROTEIN: 7.8 g/dL (ref 6.5–8.1)

## 2015-10-20 MED ORDER — HYDROCODONE-ACETAMINOPHEN 5-325 MG PO TABS: 1.0000 | ORAL_TABLET | Freq: Four times a day (QID) | ORAL | Status: DC | PRN

## 2015-10-20 NOTE — Progress Notes (Signed)
[place] OFFICE PROGRESS NOTE  Patient Care Team: Tamsen Roers, MD as PCP - General (Family Medicine)   SUMMARY OF ONCOLOGIC HISTORY:  # AUG 2016- METASTATIC PROSTATE CA/ STAGE IV; Castrate sensitive [PSA- 48] ; mets- lumbar spine [s/p pal RT to Aug 2016; Dr.Crystal] /Pelvic LN; 1994- Prostate CA s/p Surgery [facility]]; Lupron q4 M  # Bone lesions on denosumab;DEc 2016- Recm q 58M  # Left hydronephrosis s/p stenting   INTERVAL HISTORY:  A very pleasant 75 year old male patient with above history of prostate cancer stage IV hormone sensitive currently on Lupron every 4 months is here for further evaluation of his left hip pain.  He states that his hip pain has gotten worse the last 2-3 weeks. The pain radiates to the left leg. His appetite is good otherwise. His not losing any weight.  Denies any hot flashes.   REVIEW OF SYSTEMS:  A complete 10 point review of system is done which is negative except mentioned above/history of present illness.   PAST MEDICAL HISTORY :  Past Medical History  Diagnosis Date  . Diabetes mellitus without complication ([place])   . Hypertension   . Hypercholesteremia   . GERD (gastroesophageal reflux disease)   . Prostate cancer ([place])     metastatic  . Arthritis   . Heart murmur   . Aortic stenosis, moderate 01/30/2014  . Atrial flutter ([place]) 02/02/2014  . HTN (hypertension) 01/30/2014  . NSTEMI (non-ST elevated myocardial infarction) ([place]) 01/30/2014    PAST SURGICAL HISTORY :   Past Surgical History  Procedure Laterality Date  . Prostatectomy  1994  . Colon resection    . Eye surgery    . Cystoscopy w/ retrogrades Left 06/08/2015    Procedure: CYSTOSCOPY WITH RETROGRADE PYELOGRAM;  Surgeon: Nickie Retort, MD;  Location: [facility] ORS;  Service: Urology;  Laterality: Left;  . Cystoscopy w/ ureteral stent placement Left 10/05/2015    Procedure: CYSTOSCOPY WITH STENT REPLACEMENT;  Surgeon: Nickie Retort, MD;  Location: [facility]  ORS;  Service: Urology;  Laterality: Left;    FAMILY HISTORY :   Family History  Problem Relation Age of Onset  . Aneurysm Father   . Cancer Mother     breast    SOCIAL HISTORY:   Social History  Substance Use Topics  . Smoking status: Former Smoker -- 1.50 packs/day for 25 years    Types: Cigarettes    Quit date: 01/21/1974  . Smokeless tobacco: Never Used  . Alcohol Use: No    ALLERGIES:  has No Known Allergies.  MEDICATIONS:  Current Outpatient Prescriptions  Medication Sig Dispense Refill  . allopurinol (ZYLOPRIM) 300 MG tablet Take 300 mg by mouth daily.    Marland Kitchen aspirin 81 MG tablet Take 81 mg by mouth daily.    Marland Kitchen atorvastatin (LIPITOR) 10 MG tablet Take 10 mg by mouth every evening.     . carvedilol (COREG) 6.25 MG tablet Take 1 tablet (6.25 mg total) by mouth 2 (two) times daily with a meal. 60 tablet 2  . Cyanocobalamin (VITAMIN B 12 PO) Take 1,000 mcg by mouth daily.    . ferrous sulfate 325 (65 FE) MG tablet Take 325 mg by mouth 2 (two) times daily with a meal.    . HYDROcodone-acetaminophen (NORCO) 5-325 MG tablet Take 1 tablet by mouth every 6 (six) hours as needed for moderate pain. 40 tablet 0  . oxybutynin (DITROPAN) 5 MG tablet Take 5 mg by mouth 4 (four) times daily.    Marland Kitchen  pantoprazole (PROTONIX) 40 MG tablet Take 1 tablet (40 mg total) by mouth daily before breakfast. 30 tablet 2  . ranitidine (ZANTAC) 150 MG tablet Take 150 mg by mouth at bedtime.     . verapamil (CALAN-SR) 240 MG CR tablet Take 1 tablet (240 mg total) by mouth daily. (Patient taking differently: Take 240 mg by mouth 2 (two) times daily. ) 30 tablet 2   No current facility-administered medications for this visit.    PHYSICAL EXAMINATION: ECOG PERFORMANCE STATUS: 0 - Asymptomatic  BP 178/90 mmHg  Pulse 64  Temp(Src) 98.1 F (36.7 C)  Resp 18  Ht 5\' 11"  (1.803 m)  Wt 202 lb (91.627 kg)  BMI 28.19 kg/m2  Filed Weights   10/20/15 1410  Weight: 202 lb (91.627 kg)    GENERAL:  Well-nourished well-developed; Alert, no distress and comfortable.   Accompanied by his wife. Patient limps when walking. EYES: no pallor or icterus OROPHARYNX: no thrush or ulceration; dentures. NECK: supple, no masses felt LYMPH:  no palpable lymphadenopathy in the cervical, axillary or inguinal regions LUNGS: clear to auscultation and  No wheeze or crackles HEART/CVS: regular rate & rhythm and no murmurs; No lower extremity edema ABDOMEN:abdomen soft, non-tender and normal bowel sounds Musculoskeletal:no cyanosis of digits and no clubbing  PSYCH: alert & oriented x 3 with fluent speech NEURO: no focal motor/sensory deficits SKIN:  no rashes or significant lesions  LABORATORY DATA:  I have reviewed the data as listed    Component Value Date/Time   NA 138 09/28/2015 1032   K 4.3 09/28/2015 1032   CL 106 09/28/2015 1032   CO2 26 09/28/2015 1032   GLUCOSE 128* 09/28/2015 1032   BUN 28* 09/28/2015 1032   CREATININE 1.43* 09/28/2015 1032   CALCIUM 10.2 09/28/2015 1032   PROT 7.3 08/01/2015 1258   ALBUMIN 4.1 08/01/2015 1258   AST 21 08/01/2015 1258   ALT 14* 08/01/2015 1258   ALKPHOS 61 08/01/2015 1258   BILITOT 0.3 08/01/2015 1258   GFRNONAA 47* 09/28/2015 1032   GFRAA 54* 09/28/2015 1032    No results found for: SPEP, UPEP  Lab Results  Component Value Date   WBC 9.7 09/28/2015   NEUTROABS 6.7* 08/01/2015   HGB 12.9* 09/28/2015   HCT 39.1* 09/28/2015   MCV 87.9 09/28/2015   PLT 178 09/28/2015      Chemistry      Component Value Date/Time   NA 138 09/28/2015 1032   K 4.3 09/28/2015 1032   CL 106 09/28/2015 1032   CO2 26 09/28/2015 1032   BUN 28* 09/28/2015 1032   CREATININE 1.43* 09/28/2015 1032      Component Value Date/Time   CALCIUM 10.2 09/28/2015 1032   ALKPHOS 61 08/01/2015 1258   AST 21 08/01/2015 1258   ALT 14* 08/01/2015 1258   BILITOT 0.3 08/01/2015 1258        ASSESSMENT & PLAN:   # Metastatic castrate sensitive prostate cancer- currently  on Lupron every 4 months. Patient's most recent PSA is down to 1.5; from 48 in August 2016/prior to starting treatment. Given the worsening pain in his left hip- I would recommend further evaluation with a bone scan at this time. Also recommend checking PSA CBC CMP testosterone levels.  # left hip pain- unclear etiology await imaging as above. Given a prescription of hydrocodone  # Left ureteral stent/hydronephrosis- likely from pelvic adenopathy from his malignancy. Defer management to urology.  # patient will follow-up with me in approximately 10  days to review the results of his bone scan; and next plan of care.   # 25 minutes face-to-face with the patient discussing the above plan of care; more than 50% of time spent on prognosis/ natural history; counseling and coordination.        Cammie Sickle, MD 10/20/2015 2:32 PM

## 2015-10-20 NOTE — Progress Notes (Signed)
Pt states that his pain is in left hip area and he was feeling better being on hydrocodone but he was only given 30 tablets and taking 1 every 4 hours it does not take long to run out.  He says the pain med was helping him. Eating and drinking ok.

## 2015-10-21 LAB — TESTOSTERONE: Testosterone: <3 ng/dL — ABNORMAL LOW (ref 348–1197)

## 2015-10-21 LAB — PSA: PSA: 0.15 ng/mL (ref 0.00–4.00)

## 2015-10-24 ENCOUNTER — Encounter
Admission: RE | Admit: 2015-10-24 | Discharge: 2015-10-24 | Disposition: A | Discharge status: Home / Self Care | Payer: Medicare Other | Source: Ambulatory Visit | Attending: Internal Medicine | Admitting: Internal Medicine

## 2015-10-24 ENCOUNTER — Ambulatory Visit
Admission: RE | Admit: 2015-10-24 | Discharge: 2015-10-24 | Disposition: A | Discharge status: Home / Self Care | Payer: Medicare Other | Source: Ambulatory Visit | Attending: Internal Medicine | Admitting: Internal Medicine

## 2015-10-24 DIAGNOSIS — S2231XA Fracture of one rib, right side, initial encounter for closed fracture: Secondary | ICD-10-CM | POA: Diagnosis not present

## 2015-10-24 DIAGNOSIS — C61 Malignant neoplasm of prostate: Secondary | ICD-10-CM | POA: Diagnosis not present

## 2015-10-24 DIAGNOSIS — N1330 Unspecified hydronephrosis: Secondary | ICD-10-CM | POA: Insufficient documentation

## 2015-10-24 DIAGNOSIS — C7951 Secondary malignant neoplasm of bone: Secondary | ICD-10-CM | POA: Insufficient documentation

## 2015-10-24 MED ORDER — TECHNETIUM TC 99M MEDRONATE IV KIT
22.3670 | PACK | Freq: Once | INTRAVENOUS | Status: AC | PRN
  Administered 2015-10-24: 22.367 via INTRAVENOUS

## 2015-10-31 ENCOUNTER — Inpatient Hospital Stay (HOSPITAL_BASED_OUTPATIENT_CLINIC_OR_DEPARTMENT_OTHER): Payer: Medicare Other | Admitting: Internal Medicine

## 2015-10-31 ENCOUNTER — Encounter: Payer: Self-pay | Admitting: Internal Medicine

## 2015-10-31 VITALS — BP 145/82 | HR 76 | Temp 97.6°F | Resp 18 | Ht 71.0 in | Wt 207.7 lb

## 2015-10-31 DIAGNOSIS — M25552 Pain in left hip: Secondary | ICD-10-CM | POA: Diagnosis not present

## 2015-10-31 DIAGNOSIS — C7951 Secondary malignant neoplasm of bone: Secondary | ICD-10-CM | POA: Diagnosis not present

## 2015-10-31 DIAGNOSIS — K219 Gastro-esophageal reflux disease without esophagitis: Secondary | ICD-10-CM

## 2015-10-31 DIAGNOSIS — N1330 Unspecified hydronephrosis: Secondary | ICD-10-CM | POA: Diagnosis not present

## 2015-10-31 DIAGNOSIS — C61 Malignant neoplasm of prostate: Secondary | ICD-10-CM

## 2015-10-31 DIAGNOSIS — I10 Essential (primary) hypertension: Secondary | ICD-10-CM

## 2015-10-31 DIAGNOSIS — Z79899 Other long term (current) drug therapy: Secondary | ICD-10-CM

## 2015-10-31 DIAGNOSIS — I252 Old myocardial infarction: Secondary | ICD-10-CM

## 2015-10-31 DIAGNOSIS — R011 Cardiac murmur, unspecified: Secondary | ICD-10-CM

## 2015-10-31 DIAGNOSIS — Z7982 Long term (current) use of aspirin: Secondary | ICD-10-CM

## 2015-10-31 DIAGNOSIS — I350 Nonrheumatic aortic (valve) stenosis: Secondary | ICD-10-CM

## 2015-10-31 DIAGNOSIS — I4892 Unspecified atrial flutter: Secondary | ICD-10-CM

## 2015-10-31 DIAGNOSIS — E7800 Pure hypercholesterolemia, unspecified: Secondary | ICD-10-CM

## 2015-10-31 DIAGNOSIS — E119 Type 2 diabetes mellitus without complications: Secondary | ICD-10-CM

## 2015-10-31 DIAGNOSIS — M129 Arthropathy, unspecified: Secondary | ICD-10-CM

## 2015-10-31 DIAGNOSIS — Z87891 Personal history of nicotine dependence: Secondary | ICD-10-CM

## 2015-10-31 MED ORDER — HYDROCODONE-ACETAMINOPHEN 5-325 MG PO TABS: 1.0000 | ORAL_TABLET | Freq: Four times a day (QID) | ORAL | Status: DC | PRN

## 2015-10-31 NOTE — Progress Notes (Signed)
Pt needs to be referred to an orthopedic surgeon. H/O metastatic prostate cancer and hip pain.  Pt prefers to see Dr. Dondra Spry in [place] [phone]

## 2015-10-31 NOTE — Progress Notes (Signed)
[place] OFFICE PROGRESS NOTE  Patient Care Team: Tamsen Roers, MD as PCP - General (Family Medicine)   SUMMARY OF ONCOLOGIC HISTORY:  # AUG 2016- METASTATIC PROSTATE CA/ STAGE IV; Castrate sensitive [PSA- 48] ; mets- lumbar spine [s/p pal RT to Aug 2016; Dr.Crystal] /Pelvic LN; 1994- Prostate CA s/p Surgery [facility]]; Lupron q4 M; MARCH 2017- Bone scan- L4/Right pubic rami uptake; PSA- 0.1/testosterone-castrate [<3]  # Bone lesions on denosumab;DEC 2016- Recm q 74M  # Left hydronephrosis s/p stenting   INTERVAL HISTORY:  A very pleasant 75 year old male patient with above history of prostate cancer stage IV hormone sensitive currently on Lupron every 4 months is here for further evaluation of his left hip pain/ bone scan.  Patient stated that he is taking hydrocodone one pill every 6 hours; without any significant relief of his left hip pain/left knee pain. He continues to walk with a cane.   His appetite is good otherwise. His not losing any weight.  Denies any hot flashes.   REVIEW OF SYSTEMS:  A complete 10 point review of system is done which is negative except mentioned above/history of present illness.   PAST MEDICAL HISTORY :  Past Medical History  Diagnosis Date  . Diabetes mellitus without complication ([place])   . Hypertension   . Hypercholesteremia   . GERD (gastroesophageal reflux disease)   . Prostate cancer ([place])     metastatic  . Arthritis   . Heart murmur   . Aortic stenosis, moderate 01/30/2014  . Atrial flutter ([place]) 02/02/2014  . HTN (hypertension) 01/30/2014  . NSTEMI (non-ST elevated myocardial infarction) ([place]) 01/30/2014    PAST SURGICAL HISTORY :   Past Surgical History  Procedure Laterality Date  . Prostatectomy  1994  . Colon resection    . Eye surgery    . Cystoscopy w/ retrogrades Left 06/08/2015    Procedure: CYSTOSCOPY WITH RETROGRADE PYELOGRAM;  Surgeon: Nickie Retort, MD;  Location: [facility] ORS;  Service: Urology;   Laterality: Left;  . Cystoscopy w/ ureteral stent placement Left 10/05/2015    Procedure: CYSTOSCOPY WITH STENT REPLACEMENT;  Surgeon: Nickie Retort, MD;  Location: [facility] ORS;  Service: Urology;  Laterality: Left;    FAMILY HISTORY :   Family History  Problem Relation Age of Onset  . Aneurysm Father   . Cancer Mother     breast    SOCIAL HISTORY:   Social History  Substance Use Topics  . Smoking status: Former Smoker -- 1.50 packs/day for 25 years    Types: Cigarettes    Quit date: 01/21/1974  . Smokeless tobacco: Never Used  . Alcohol Use: No    ALLERGIES:  has No Known Allergies.  MEDICATIONS:  Current Outpatient Prescriptions  Medication Sig Dispense Refill  . allopurinol (ZYLOPRIM) 300 MG tablet Take 300 mg by mouth daily.    Marland Kitchen aspirin 81 MG tablet Take 81 mg by mouth daily.    Marland Kitchen atorvastatin (LIPITOR) 10 MG tablet Take 10 mg by mouth every evening.     . carvedilol (COREG) 6.25 MG tablet Take 1 tablet (6.25 mg total) by mouth 2 (two) times daily with a meal. 60 tablet 2  . Cyanocobalamin (VITAMIN B 12 PO) Take 1,000 mcg by mouth daily.    . ferrous sulfate 325 (65 FE) MG tablet Take 325 mg by mouth 2 (two) times daily with a meal.    . HYDROcodone-acetaminophen (NORCO) 5-325 MG tablet Take 1 tablet by mouth every 6 (six)  hours as needed for moderate pain. 40 tablet 0  . oxybutynin (DITROPAN) 5 MG tablet Take 5 mg by mouth 4 (four) times daily.    . pantoprazole (PROTONIX) 40 MG tablet Take 1 tablet (40 mg total) by mouth daily before breakfast. 30 tablet 2  . ranitidine (ZANTAC) 150 MG tablet Take 150 mg by mouth at bedtime.     . verapamil (CALAN-SR) 240 MG CR tablet Take 1 tablet (240 mg total) by mouth daily. (Patient taking differently: Take 240 mg by mouth 2 (two) times daily. ) 30 tablet 2   No current facility-administered medications for this visit.    PHYSICAL EXAMINATION: ECOG PERFORMANCE STATUS: 0 - Asymptomatic  BP 145/82 mmHg  Pulse 76  Temp(Src)  97.6 F (36.4 C) (Tympanic)  Resp 18  Ht 5\' 11"  (1.803 m)  Wt 207 lb 10.8 oz (94.2 kg)  BMI 28.98 kg/m2  Filed Weights   10/31/15 1529  Weight: 207 lb 10.8 oz (94.2 kg)    GENERAL: Well-nourished well-developed; Alert, no distress and comfortable.   Accompanied by his wife. Patient limps when walking. EYES: no pallor or icterus OROPHARYNX: no thrush or ulceration; dentures. NECK: supple, no masses felt LYMPH:  no palpable lymphadenopathy in the cervical, axillary or inguinal regions LUNGS: clear to auscultation and  No wheeze or crackles HEART/CVS: regular rate & rhythm and no murmurs; No lower extremity edema ABDOMEN:abdomen soft, non-tender and normal bowel sounds Musculoskeletal:no cyanosis of digits and no clubbing  PSYCH: alert & oriented x 3 with fluent speech NEURO: no focal motor/sensory deficits SKIN:  no rashes or significant lesions  LABORATORY DATA:  I have reviewed the data as listed    Component Value Date/Time   NA 132* 10/20/2015 1445   K 4.3 10/20/2015 1445   CL 102 10/20/2015 1445   CO2 25 10/20/2015 1445   GLUCOSE 138* 10/20/2015 1445   BUN 24* 10/20/2015 1445   CREATININE 1.35* 10/20/2015 1445   CALCIUM 9.8 10/20/2015 1445   PROT 7.8 10/20/2015 1445   ALBUMIN 4.2 10/20/2015 1445   AST 20 10/20/2015 1445   ALT 14* 10/20/2015 1445   ALKPHOS 63 10/20/2015 1445   BILITOT 0.5 10/20/2015 1445   GFRNONAA 50* 10/20/2015 1445   GFRAA 58* 10/20/2015 1445    No results found for: SPEP, UPEP  Lab Results  Component Value Date   WBC 11.5* 10/20/2015   NEUTROABS 6.7* 08/01/2015   HGB 13.9 10/20/2015   HCT 42.0 10/20/2015   MCV 88.0 10/20/2015   PLT 207 10/20/2015      Chemistry      Component Value Date/Time   NA 132* 10/20/2015 1445   K 4.3 10/20/2015 1445   CL 102 10/20/2015 1445   CO2 25 10/20/2015 1445   BUN 24* 10/20/2015 1445   CREATININE 1.35* 10/20/2015 1445      Component Value Date/Time   CALCIUM 9.8 10/20/2015 1445   ALKPHOS 63  10/20/2015 1445   AST 20 10/20/2015 1445   ALT 14* 10/20/2015 1445   BILITOT 0.5 10/20/2015 1445        ASSESSMENT & PLAN:   # Metastatic castrate sensitive prostate cancer- currently on Lupron every 4 months. Patient's most recent PSA is down to 0.15/testosterone [<3 ] from 48 in August 2016/prior to starting treatment. Bone scan shows-chronic L4 lesion/ punctate focus in the right pubic rami. I do not think this is clinically relevant at this time. I think patient is having excellent response to therapy. Continue Lupron/Xgeva  every 4 months. The next treatment is in April 2017.  #  left hip/left knee pain- This is unlikely related to his prostate cancer. Question arthritis versus other causes. Refer to orthopedics. Recommend continued hydrocodone 1 every 6 hours.   # Left ureteral stent/hydronephrosis- likely from pelvic adenopathy from his malignancy. Defer management to urology.  # patient will follow-up with me in approximately 4 months in Aug 2017.   # 25 minutes face-to-face with the patient discussing the above plan of care; more than 50% of time spent on prognosis/ natural history; counseling and coordination.        Cammie Sickle, MD 10/31/2015 3:35 PM

## 2015-10-31 NOTE — Progress Notes (Signed)
Pt's wife states that she will call Dr. Reather Littler office for her husband referral for orthopedics.

## 2015-10-31 NOTE — Progress Notes (Signed)
Pt here to get bone scan results. He cont. To have hip pain on left. And uses hydrocodone and does not feel like it works very well for him.  He has fell twice in 2 days when his knee buckles on him.

## 2015-11-18 ENCOUNTER — Other Ambulatory Visit: Payer: Self-pay | Admitting: Orthopedic Surgery

## 2015-11-21 ENCOUNTER — Inpatient Hospital Stay: Payer: Medicare Other

## 2015-11-21 ENCOUNTER — Ambulatory Visit: Payer: Medicare Other | Admitting: Internal Medicine

## 2015-11-21 ENCOUNTER — Inpatient Hospital Stay: Payer: Medicare Other | Attending: Internal Medicine

## 2015-11-21 DIAGNOSIS — Z79818 Long term (current) use of other agents affecting estrogen receptors and estrogen levels: Secondary | ICD-10-CM | POA: Diagnosis not present

## 2015-11-21 DIAGNOSIS — C61 Malignant neoplasm of prostate: Secondary | ICD-10-CM | POA: Diagnosis not present

## 2015-11-21 DIAGNOSIS — Z79899 Other long term (current) drug therapy: Secondary | ICD-10-CM | POA: Diagnosis not present

## 2015-11-21 DIAGNOSIS — C7951 Secondary malignant neoplasm of bone: Principal | ICD-10-CM

## 2015-11-21 LAB — CBC WITH DIFFERENTIAL/PLATELET
BASOS ABS: 0.1 10*3/uL (ref 0–0.1)
Basophils Relative: 1 %
EOS PCT: 4 %
Eosinophils Absolute: 0.5 10*3/uL (ref 0–0.7)
HEMATOCRIT: 40.6 % (ref 40.0–52.0)
HEMOGLOBIN: 13.5 g/dL (ref 13.0–18.0)
LYMPHS ABS: 1.6 10*3/uL (ref 1.0–3.6)
LYMPHS PCT: 14 %
MCH: 29.1 pg (ref 26.0–34.0)
MCHC: 33.2 g/dL (ref 32.0–36.0)
MCV: 87.9 fL (ref 80.0–100.0)
Monocytes Absolute: 0.8 10*3/uL (ref 0.2–1.0)
Monocytes Relative: 7 %
NEUTROS ABS: 8.8 10*3/uL — AB (ref 1.4–6.5)
NEUTROS PCT: 74 %
PLATELETS: 205 10*3/uL (ref 150–440)
RBC: 4.62 MIL/uL (ref 4.40–5.90)
RDW: 15.2 % — ABNORMAL HIGH (ref 11.5–14.5)
WBC: 11.9 10*3/uL — AB (ref 3.8–10.6)

## 2015-11-21 LAB — COMPREHENSIVE METABOLIC PANEL
ALK PHOS: 73 U/L (ref 38–126)
ALT: 16 U/L — AB (ref 17–63)
AST: 20 U/L (ref 15–41)
Albumin: 4.2 g/dL (ref 3.5–5.0)
Anion gap: 6 (ref 5–15)
BUN: 27 mg/dL — AB (ref 6–20)
CALCIUM: 10.1 mg/dL (ref 8.9–10.3)
CO2: 24 mmol/L (ref 22–32)
CREATININE: 1.28 mg/dL — AB (ref 0.61–1.24)
Chloride: 105 mmol/L (ref 101–111)
GFR, EST NON AFRICAN AMERICAN: 53 mL/min — AB (ref >60)
Glucose, Bld: 120 mg/dL — ABNORMAL HIGH (ref 65–99)
Potassium: 4.4 mmol/L (ref 3.5–5.1)
Sodium: 135 mmol/L (ref 135–145)
Total Bilirubin: 0.5 mg/dL (ref 0.3–1.2)
Total Protein: 7.4 g/dL (ref 6.5–8.1)

## 2015-11-21 LAB — PSA: PSA: 0.12 ng/mL (ref 0.00–4.00)

## 2015-11-21 MED ORDER — DENOSUMAB 120 MG/1.7ML ~~LOC~~ SOLN
120.0000 mg | Freq: Once | SUBCUTANEOUS | Status: AC
  Administered 2015-11-21: 120 mg via SUBCUTANEOUS
  Filled 2015-11-21: qty 1.7

## 2015-11-21 MED ORDER — LEUPROLIDE ACETATE (4 MONTH) 30 MG IM KIT
30.0000 mg | PACK | Freq: Once | INTRAMUSCULAR | Status: AC
Start: 2015-11-21 — End: 2015-11-21
  Administered 2015-11-21: 30 mg via INTRAMUSCULAR
  Filled 2015-11-21: qty 30

## 2015-12-08 NOTE — Pre-Procedure Instructions (Signed)
Pt's chart, Echo, EKG and office notes reviewed by Anesthesia. Pt's foot surgery with Dr. Doran Durand to be moved to main OR per Dr. Aris Lot.

## 2015-12-12 ENCOUNTER — Telehealth: Payer: Self-pay | Admitting: Radiology

## 2015-12-12 NOTE — Telephone Encounter (Signed)
-----   Message from Nickie Retort, MD sent at 10/05/2015 10:40 AM EST ----- Patient needs repeat cystoscopy, left ureteral stent exchange in 3 months. He does not need to be seen in the office prior. Thanks.

## 2015-12-12 NOTE — Telephone Encounter (Signed)
Pt notified of surgery scheduled 01/04/16, pre-admit testing appt on 12/20/15 @10 :30 and to call day prior to surgery for arrival time to SDS. Advised pt he may continue taking ASA 81mg  until surgery per Dr Pilar Jarvis. Pt voices understanding.

## 2015-12-14 ENCOUNTER — Encounter (HOSPITAL_COMMUNITY): Payer: Self-pay | Admitting: Vascular Surgery

## 2015-12-14 ENCOUNTER — Encounter (HOSPITAL_BASED_OUTPATIENT_CLINIC_OR_DEPARTMENT_OTHER): Payer: Self-pay | Admitting: *Deleted

## 2015-12-14 MED ORDER — CEFAZOLIN SODIUM-DEXTROSE 2-4 GM/100ML-% IV SOLN: 2.0000 g | INTRAVENOUS | Status: AC

## 2015-12-14 MED ORDER — SODIUM CHLORIDE 0.9 % IV SOLN
INTRAVENOUS | Status: DC
  Administered 2016-01-04 – 2016-04-11 (×2): via INTRAVENOUS

## 2015-12-14 NOTE — Progress Notes (Signed)
Anesthesia Chart Review: SAME DAY WORK-UP.  Patient is a 75 year old male scheduled for left 4th toe amputation on 12/15/15 by Dr. Doran Durand. 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 Case is posted for MAC anesthesia.   History includes stage IV prostate cancer (s/p prostatectomy '94) with left hydronephrosis s/p s/p cystoscopy with left ureteral stent exchange 10/05/15 [facility]) and attempted left ureteral stent 06/08/15 (left ureteral stent in IR 06/24/15), remote former smoker, DM2, HTN, hypercholesterolemia, GERD, arthritis, NSTEMI with aflutter 01/2014, moderate AS by 01/2014 echo, HTN, colon resection. Admission 01/13/14-02/11/14 with acute renal failure requiring CVVH and short-term hemodialysis, aspiration PNA requiring intubation 01/14/14-01/18/14, septic shock with metabolic acidosis, NSTEMI (felt secondary to demand ischemia), atrial flutter (02/02/14 during dialysis; treated with IV Lopressor). Echo then showed moderate AS. Cardiology was not consulted, and he was not referred at discharge. Patient is scheduled for another cystoscopy with left ureteral stent exchange on 01/04/16.  PCP is Dr. Tamsen Roers. Urologist is Dr. Baruch Gouty. HEM-ONC is Dr. Charlaine Dalton. RAD-ONC is Dr. Noreene Filbert.  05/31/15 EKG: SB at 58 bpm, LAFB, T wave abnormality, consider lateral ischemia. When compared to 02/02/14 tracing SR has replaced atrial flutter, ST no longer elevated in inferior leads, T wave inversion is less evident in lateral leads, non-specific changes in anterior ST segments.   01/14/14 Echo: Study Conclusions - Left ventricle: E/e&'>14 consistent with increased LV filling pressure. The cavity size was normal. Systolic function was vigorous. The estimated ejection fraction was in the range of 65% to 70%. Wall motion was normal; there were no regional wall motion abnormalities. There was an increased relative contribution of atrial contraction to ventricular filling. Features are  consistent with a pseudonormal left ventricular filling pattern, with concomitant abnormal relaxation and increased filling pressure (grade 2 diastolic dysfunction). - Aortic valve: Severe diffuse thickening and calcification. Valve mobility was mildly restricted. There was moderate stenosis. Valve area (VTI): 1.45 cm^2. Valve area (Vmax): 1.29 cm^2. - Tricuspid valve: There was mild regurgitation. - Pulmonic valve: There was trivial regurgitation.  06/30/15 Carotid U/S: IMPRESSION: Minimal to moderate amount of bilateral atherosclerotic plaque, left greater than right, not resulting in a hemodynamically significant stenosis within either internal carotid artery.  03/08/15 CT chest/abd/pelvis: IMPRESSION: - Prostatic bed mass with probable secondary compression or postsurgical adhesion producing moderate to severe left hydroureteronephrosis. - No clear fat plane is visible between this mass and the anterior aspect of the rectum, raising the question of rectal invasion. Primary exophytic rectal adenocarcinoma is felt less likely. - Retroperitoneal and left external iliac chain lymphadenopathy compatible with metastatic disease. - L4 osseous metastatic disease with interval sclerosis. - 4 mm left upper lobe pulmonary parenchymal nodule, amenable to presumed followup that feature for staining exams.  He will need labs prior to surgery. Last Cr was 1.28, H/H were 13.5/40.6 on 11/21/15.  Reviewed above with anesthesiologist Dr. Oletta Lamas. With cardiac history from 2015 and moderate AS at that time, Dr. Oletta Lamas recommended preoperative cardiology evaluation if surgery was not felt urgent. If it was urgent then would consider cardiology evaluation on the day of surgery. Case is currently posted as a first case. I called and spoke with Dr. Doran Durand. From when he saw patient a month ago, wound was chronic and surgery not felt urgent, but he does recommend case be done sooner than later. He will  have his staff work on getting patient a cardiology evaluation.    [facility] Short Stay Center/Anesthesiology Phone [phone] 12/14/2015 2:28  PM   

## 2015-12-15 ENCOUNTER — Ambulatory Visit (HOSPITAL_BASED_OUTPATIENT_CLINIC_OR_DEPARTMENT_OTHER): Admission: RE | Admit: 2015-12-15 | Payer: Medicare Other | Source: Ambulatory Visit | Admitting: Orthopedic Surgery

## 2015-12-15 SURGERY — AMPUTATION, TOE
Anesthesia: Monitor Anesthesia Care | Site: Toe | Laterality: Left

## 2015-12-16 ENCOUNTER — Encounter: Payer: Self-pay | Admitting: Cardiology

## 2015-12-16 NOTE — Progress Notes (Signed)
Received cardiac clearance form for patient from [facility]. Patient has an upcoming appointment on 01/03/16 at 3:15pm. Form placed in red folder in "To Do" bin on Wylee Ogden's desk.

## 2015-12-20 ENCOUNTER — Encounter
Admission: RE | Admit: 2015-12-20 | Discharge: 2015-12-20 | Disposition: A | Discharge status: Home / Self Care | Payer: Medicare Other | Source: Ambulatory Visit | Attending: Urology | Admitting: Urology

## 2015-12-20 DIAGNOSIS — Z01818 Encounter for other preprocedural examination: Secondary | ICD-10-CM | POA: Diagnosis not present

## 2015-12-20 HISTORY — DX: Other symptoms and signs involving the musculoskeletal system: R29.898

## 2015-12-20 NOTE — Patient Instructions (Signed)
  Your procedure is scheduled on: 01/04/16 Wed Report to Same Day Surgery 2nd floor medical mall To find out your arrival time please call [phone] between 1PM - 3PM on 01/03/16 Tues  Remember: Instructions that are not followed completely may result in serious medical risk, up to and including death, or upon the discretion of your surgeon and anesthesiologist your surgery may need to be rescheduled.    _x___ 1. Do not eat food or drink liquids after midnight. No gum chewing or hard candies.     ____ 2. No Alcohol for 24 hours before or after surgery.   ____ 3. Bring all medications with you on the day of surgery if instructed.    __x__ 4. Notify your doctor if there is any change in your medical condition     (cold, fever, infections).     Do not wear jewelry, make-up, hairpins, clips or nail polish.  Do not wear lotions, powders, or perfumes. You may wear deodorant.  Do not shave 48 hours prior to surgery. Men may shave face and neck.  Do not bring valuables to the hospital.    [facility] is not responsible for any belongings or valuables.               Contacts, dentures or bridgework may not be worn into surgery.  Leave your suitcase in the car. After surgery it may be brought to your room.  For patients admitted to the hospital, discharge time is determined by your treatment team.   Patients discharged the day of surgery will not be allowed to drive home.    Please read over the following fact sheets that you were given:   [facility] Preparing for Surgery and or MRSA Information   _x___ Take these medicines the morning of surgery with A SIP OF WATER:    1. carvedilol (COREG) 6.25 MG tablet  2.pantoprazole (PROTONIX) 40 MG tablet  3.verapamil (CALAN-SR) 240 MG CR tablet  4.  5.  6.  ____ Fleet Enema (as directed)   ____ Use CHG Soap or sage wipes as directed on instruction sheet   ____ Use inhalers on the day of surgery and bring to hospital day of surgery  ____  Stop metformin 2 days prior to surgery    ____ Take 1/2 of usual insulin dose the night before surgery and none on the morning of           surgery.   _x___ Stop aspirin or coumadin, or plavix Stop aspirin 1 week before surgery  _x__ Stop Anti-inflammatories such as Advil, Aleve, Ibuprofen, Motrin, Naproxen,          Naprosyn, Goodies powders or aspirin products. Ok to take Tylenol.   ____ Stop supplements until after surgery.    ____ Bring C-Pap to the hospital.

## 2015-12-23 LAB — URINE CULTURE: Culture: >=100000 — AB

## 2015-12-26 ENCOUNTER — Telehealth: Payer: Self-pay | Admitting: Radiology

## 2015-12-26 ENCOUNTER — Other Ambulatory Visit: Payer: Self-pay

## 2015-12-26 DIAGNOSIS — N390 Urinary tract infection, site not specified: Secondary | ICD-10-CM

## 2015-12-26 MED ORDER — AMPICILLIN 500 MG PO CAPS: 500.0000 mg | ORAL_CAPSULE | Freq: Four times a day (QID) | ORAL | Status: DC

## 2015-12-26 NOTE — Telephone Encounter (Signed)
Pt notified of positive ucx & prescription for ampicillin 500mg  q6hr x 7 days per Dr Pilar Jarvis was sent to pt's pharmacy. Appt made for repeat ucx on 5/15. Pt voices understanding.

## 2015-12-30 ENCOUNTER — Other Ambulatory Visit: Payer: Self-pay

## 2015-12-30 DIAGNOSIS — N1330 Unspecified hydronephrosis: Secondary | ICD-10-CM

## 2016-01-02 ENCOUNTER — Other Ambulatory Visit: Payer: Medicare Other

## 2016-01-02 DIAGNOSIS — N1330 Unspecified hydronephrosis: Secondary | ICD-10-CM

## 2016-01-02 LAB — URINALYSIS, COMPLETE
Bilirubin, UA: NEGATIVE
GLUCOSE, UA: NEGATIVE
KETONES UA: NEGATIVE
NITRITE UA: NEGATIVE
Protein, UA: NEGATIVE
RBC, UA: NEGATIVE
SPEC GRAV UA: 1.02 (ref 1.005–1.030)
Urobilinogen, Ur: 0.2 mg/dL (ref 0.2–1.0)
pH, UA: 6 (ref 5.0–7.5)

## 2016-01-02 LAB — MICROSCOPIC EXAMINATION
Bacteria, UA: NONE SEEN
Epithelial Cells (non renal): NONE SEEN /hpf (ref 0–10)
RBC, UA: NONE SEEN /hpf (ref 0–?)

## 2016-01-03 ENCOUNTER — Encounter: Payer: Self-pay | Admitting: Cardiology

## 2016-01-03 ENCOUNTER — Ambulatory Visit (INDEPENDENT_AMBULATORY_CARE_PROVIDER_SITE_OTHER): Payer: Medicare Other | Admitting: Cardiology

## 2016-01-03 VITALS — BP 126/80 | HR 72 | Ht 71.0 in | Wt 197.8 lb

## 2016-01-03 DIAGNOSIS — E785 Hyperlipidemia, unspecified: Secondary | ICD-10-CM | POA: Diagnosis not present

## 2016-01-03 DIAGNOSIS — Z01810 Encounter for preprocedural cardiovascular examination: Secondary | ICD-10-CM | POA: Diagnosis not present

## 2016-01-03 DIAGNOSIS — I10 Essential (primary) hypertension: Secondary | ICD-10-CM | POA: Diagnosis not present

## 2016-01-03 DIAGNOSIS — I350 Nonrheumatic aortic (valve) stenosis: Secondary | ICD-10-CM

## 2016-01-03 NOTE — Patient Instructions (Addendum)
Medication Instructions:  Your physician recommends that you continue on your current medications as directed. Please refer to the Current Medication list given to you today.   Labwork: None ordered  Testing/Procedures: Your physician has requested that you have an echocardiogram. Echocardiography is a painless test that uses sound waves to create images of your heart. It provides your doctor with information about the size and shape of your heart and how well your heart's chambers and valves are working. This procedure takes approximately one hour. There are no restrictions for this procedure.  Date & Time: _______________________________________________________________________  [facility]  Your caregiver has ordered a Stress Test with nuclear imaging. The purpose of this test is to evaluate the blood supply to your heart muscle. This procedure is referred to as a "Non-Invasive Stress Test." This is because other than having an IV started in your vein, nothing is inserted or "invades" your body. Cardiac stress tests are done to find areas of poor blood flow to the heart by determining the extent of coronary artery disease (CAD). Some patients exercise on a treadmill, which naturally increases the blood flow to your heart, while others who are  unable to walk on a treadmill due to physical limitations have a pharmacologic/chemical stress agent called Lexiscan . This medicine will mimic walking on a treadmill by temporarily increasing your coronary blood flow.   Please note: these test may take anywhere between 2-4 hours to complete  PLEASE REPORT TO [facility] AT THE FIRST DESK WILL DIRECT YOU WHERE TO GO  Date of Procedure:_Friday Jan 13, 2016 at 07:30 AM_____  Arrival Time for Procedure:__Arrive at 07:15AM to register_______  Instructions regarding medication:   _X__ : Hold diabetes medication morning of procedure  _X__:  Hold Coreg the night before  procedure and morning of procedure   PLEASE NOTIFY THE OFFICE AT LEAST 24 HOURS IN ADVANCE IF YOU ARE UNABLE TO KEEP YOUR APPOINTMENT.  [phone] AND  PLEASE NOTIFY NUCLEAR MEDICINE AT [facility] AT LEAST 24 HOURS IN ADVANCE IF YOU ARE UNABLE TO KEEP YOUR APPOINTMENT. [phone]  How to prepare for your Myoview test:   Do not eat or drink after midnight  No caffeine for 24 hours prior to test  No smoking 24 hours prior to test.  Your medication may be taken with water.  If your doctor stopped a medication because of this test, do not take that medication.  Ladies, please do not wear dresses.  Skirts or pants are appropriate. Please wear a short sleeve shirt.  No perfume, cologne or lotion.  Wear comfortable walking shoes. No heels!    Follow-Up: Your physician recommends that you schedule a follow-up appointment after testing to review results.  Date & Time:________________________________________________________________________________   Any Other Special Instructions Will Be Listed Below (If Applicable).     If you need a refill on your cardiac medications before your next appointment, please call your pharmacy.  Echocardiogram An echocardiogram, or echocardiography, uses sound waves (ultrasound) to produce an image of your heart. The echocardiogram is simple, painless, obtained within a short period of time, and offers valuable information to your health care provider. The images from an echocardiogram can provide information such as:  Evidence of coronary artery disease (CAD).  Heart size.  Heart muscle function.  Heart valve function.  Aneurysm detection.  Evidence of a past heart attack.  Fluid buildup around the heart.  Heart muscle thickening.  Assess heart valve function. [place]  CARE PROVIDER KNOW ABOUT:  Any allergies you have.  All medicines you are taking, including vitamins, herbs, eye drops, creams, and over-the-counter  medicines.  Previous problems you or members of your family have had with the use of anesthetics.  Any blood disorders you have.  Previous surgeries you have had.  Medical conditions you have.  Possibility of pregnancy, if this applies. BEFORE THE PROCEDURE  No special preparation is needed. Eat and drink normally.  PROCEDURE   In order to produce an image of your heart, gel will be applied to your chest and a wand-like tool (transducer) will be moved over your chest. The gel will help transmit the sound waves from the transducer. The sound waves will harmlessly bounce off your heart to allow the heart images to be captured in real-time motion. These images will then be recorded.  You may need an IV to receive a medicine that improves the quality of the pictures. AFTER THE PROCEDURE You may return to your normal schedule including diet, activities, and medicines, unless your health care provider tells you otherwise.   This information is not intended to replace advice given to you by your health care provider. Make sure you discuss any questions you have with your health care provider.   Document Released: 08/03/2000 Document Revised: 08/27/2014 Document Reviewed: 04/13/2013 Elsevier Interactive Patient Education 2016 [place].   Pharmacologic Stress Electrocardiogram A pharmacologic stress electrocardiogram is a heart (cardiac) test that uses nuclear imaging to evaluate the blood supply to your heart. This test may also be called a pharmacologic stress electrocardiography. Pharmacologic means that a medicine is used to increase your heart rate and blood pressure.  This stress test is done to find areas of poor blood flow to the heart by determining the extent of coronary artery disease (CAD). Some people exercise on a treadmill, which naturally increases the blood flow to the heart. For those people unable to exercise on a treadmill, a medicine is used. This medicine stimulates  your heart and will cause your heart to beat harder and more quickly, as if you were exercising.  Pharmacologic stress tests can help determine:  The adequacy of blood flow to your heart during increased levels of activity in order to clear you for discharge home.  The extent of coronary artery blockage caused by CAD.  Your prognosis if you have suffered a heart attack.  The effectiveness of cardiac procedures done, such as an angioplasty, which can increase the circulation in your coronary arteries.  Causes of chest pain or pressure. [facility] CARE PROVIDER KNOW ABOUT:  Any allergies you have.  All medicines you are taking, including vitamins, herbs, eye drops, creams, and over-the-counter medicines.  Previous problems you or members of your family have had with the use of anesthetics.  Any blood disorders you have.  Previous surgeries you have had.  Medical conditions you have.  Possibility of pregnancy, if this applies.  If you are currently breastfeeding. RISKS AND COMPLICATIONS Generally, this is a safe procedure. However, as with any procedure, complications can occur. Possible complications include:  You develop pain or pressure in the following areas:  Chest.  Jaw or neck.  Between your shoulder blades.  Radiating down your left arm.  Headache.  Dizziness or light-headedness.  Shortness of breath.  Increased or irregular heartbeat.  Low blood pressure.  Nausea or vomiting.  Flushing.  Redness going up the arm and slight pain during injection of medicine.  Heart attack (rare). BEFORE  THE PROCEDURE   Avoid all forms of caffeine for 24 hours before your test or as directed by your health care provider. This includes coffee, tea (even decaffeinated tea), caffeinated sodas, chocolate, cocoa, and certain pain medicines.  Follow your health care provider's instructions regarding eating and drinking before the test.  Take your medicines as  directed at regular times with water unless instructed otherwise. Exceptions may include:  If you have diabetes, ask how you are to take your insulin or pills. It is common to adjust insulin dosing the morning of the test.  If you are taking beta-blocker medicines, it is important to talk to your health care provider about these medicines well before the date of your test. Taking beta-blocker medicines may interfere with the test. In some cases, these medicines need to be changed or stopped 24 hours or more before the test.  If you wear a nitroglycerin patch, it may need to be removed prior to the test. Ask your health care provider if the patch should be removed before the test.  If you use an inhaler for any breathing condition, bring it with you to the test.  If you are an outpatient, bring a snack so you can eat right after the stress phase of the test.  Do not smoke for 4 hours prior to the test or as directed by your health care provider.  Do not apply lotions, powders, creams, or oils on your chest prior to the test.  Wear comfortable shoes and clothing. Let your health care provider know if you were unable to complete or follow the preparations for your test. PROCEDURE   Multiple patches (electrodes) will be put on your chest. If needed, small areas of your chest may be shaved to get better contact with the electrodes. Once the electrodes are attached to your body, multiple wires will be attached to the electrodes, and your heart rate will be monitored.  An IV access will be started. A nuclear trace (isotope) is given. The isotope may be given intravenously, or it may be swallowed. Nuclear refers to several types of radioactive isotopes, and the nuclear isotope lights up the arteries so that the nuclear images are clear. The isotope is absorbed by your body. This results in low radiation exposure.  A resting nuclear image is taken to show how your heart functions at rest.  A  medicine is given through the IV access.  A second scan is done about 1 hour after the medicine injection and determines how your heart functions under stress.  During this stress phase, you will be connected to an electrocardiogram machine. Your blood pressure and oxygen levels will be monitored. AFTER THE PROCEDURE   Your heart rate and blood pressure will be monitored after the test.  You may return to your normal schedule, including diet,activities, and medicines, unless your health care provider tells you otherwise.   This information is not intended to replace advice given to you by your health care provider. Make sure you discuss any questions you have with your health care provider.   Document Released: 12/23/2008 Document Revised: 08/11/2013 Document Reviewed: 04/13/2013 Elsevier Interactive Patient Education [employer].

## 2016-01-03 NOTE — Progress Notes (Addendum)
Cardiology Office Note   Date:  01/03/2016   ID:  [name], DOB 14-Dec-1940, MRN [id number]  Referring Doctor:  Tamsen Roers, MD   Cardiologist:   Wende Bushy, MD   Reason for consultation:  Chief Complaint  Patient presents with  . Establish Care    surgical clearance      History of Present Illness: [name] is a 75 y.o. male who presents for Preoperative cardiac evaluation prior to toe amputation.  Patient does not volunteer any symptoms of chest pain, shortness of breath, palpitations, lightheadedness, syncope. He has limited functional capacity/walking ability due to nonhealing ulcer on the left foot/toe, he walks with a cane.  Patient denies fever, cough, colds, abdominal pain. No PND, orthopnea, edema.  ROS:  Please see the history of present illness. Aside from mentioned under HPI, all other systems are reviewed and negative.     Past Medical History  Diagnosis Date  . Diabetes mellitus without complication ([place])   . Hypertension   . Hypercholesteremia   . GERD (gastroesophageal reflux disease)   . Arthritis   . Heart murmur   . Aortic stenosis, moderate 01/30/2014  . Atrial flutter ([place]) 02/02/2014  . HTN (hypertension) 01/30/2014  . NSTEMI (non-ST elevated myocardial infarction) ([place]) 01/30/2014  . Heart murmur   . Weakness of left leg   . Prostate cancer [facility])     metastatic   Demand ischemia and atrial flutter back in 2015 in the setting of acute renal failure, septic shock   Past Surgical History  Procedure Laterality Date  . Prostatectomy  1994  . Colon resection    . Eye surgery    . Cystoscopy w/ retrogrades Left 06/08/2015    Procedure: CYSTOSCOPY WITH RETROGRADE PYELOGRAM;  Surgeon: Nickie Retort, MD;  Location: [facility] ORS;  Service: Urology;  Laterality: Left;  . Cystoscopy w/ ureteral stent placement Left 10/05/2015    Procedure: CYSTOSCOPY WITH STENT REPLACEMENT;  Surgeon: Nickie Retort, MD;  Location: [facility] ORS;  Service:  Urology;  Laterality: Left;  . Cardiac catheterization      [facility]  . Cataract extraction w/ intraocular lens implant Bilateral      reports that he quit smoking about 41 years ago. His smoking use included Cigarettes. He has a 37.5 pack-year smoking history. He has never used smokeless tobacco. He reports that he does not drink alcohol or use illicit drugs.   family history includes Aneurysm in his father; Cancer in his mother.   Current Outpatient Prescriptions  Medication Sig Dispense Refill  . allopurinol (ZYLOPRIM) 300 MG tablet Take 300 mg by mouth daily.    Marland Kitchen atorvastatin (LIPITOR) 10 MG tablet Take 10 mg by mouth every evening.     . carvedilol (COREG) 6.25 MG tablet Take 1 tablet (6.25 mg total) by mouth 2 (two) times daily with a meal. 60 tablet 2  . Cyanocobalamin (VITAMIN B 12 PO) Take 1,000 mcg by mouth daily.    . ferrous sulfate 325 (65 FE) MG tablet Take 325 mg by mouth 2 (two) times daily with a meal.    . oxybutynin (DITROPAN) 5 MG tablet Take 5 mg by mouth 4 (four) times daily.    . pantoprazole (PROTONIX) 40 MG tablet Take 1 tablet (40 mg total) by mouth daily before breakfast. 30 tablet 2  . ranitidine (ZANTAC) 150 MG tablet Take 150 mg by mouth at bedtime.     . verapamil (CALAN-SR) 240 MG CR tablet Take 1 tablet (  240 mg total) by mouth daily. (Patient taking differently: Take 240 mg by mouth 2 (two) times daily. ) 30 tablet 2  . aspirin 81 MG tablet Take 81 mg by mouth daily. Reported on 01/03/2016     No current facility-administered medications for this visit.   Facility-Administered Medications Ordered in Other Visits  Medication Dose Route Frequency Provider Last Rate Last Dose  . 0.9 %  sodium chloride infusion   Intravenous Continuous Wylene Simmer, MD        Allergies: Review of patient's allergies indicates no known allergies.    PHYSICAL EXAM: VS:  BP 126/80 mmHg  Pulse 72  Ht 5\' 11"  (1.803 m)  Wt 197 lb 12.8 oz (89.721 kg)  BMI 27.60 kg/m2  SpO2 96%  , Body mass index is 27.6 kg/(m^2). Wt Readings from Last 3 Encounters:  01/03/16 197 lb 12.8 oz (89.721 kg)  12/20/15 198 lb (89.812 kg)  10/31/15 207 lb 10.8 oz (94.2 kg)    GENERAL:  well developed, well nourished, not in acute distress HEENT: normocephalic, pink conjunctivae, anicteric sclerae, no xanthelasma, normal dentition, oropharynx clear NECK:  no neck vein engorgement, JVP normal, no hepatojugular reflux, carotid upstroke brisk and symmetric, no bruit, no thyromegaly, no lymphadenopathy LUNGS:  good respiratory effort, clear to auscultation bilaterally CV:  PMI not displaced, no thrills, no lifts, S1 and S2 within normal limits, no palpable S3 or S4, 2 to 3/6 systolic ejection murmur, no rubs, no gallops ABD:  Soft, nontender, nondistended, normoactive bowel sounds, no abdominal aortic bruit, no hepatomegaly, no splenomegaly MS: nontender back, no kyphosis, no scoliosis, no joint deformities EXT:  2+ DP/PT pulses, no edema, no varicosities, no cyanosis, no clubbing SKIN: warm, nondiaphoretic, normal turgor, no ulcers NEUROPSYCH: alert, oriented to person, place, and time, sensory/motor grossly intact, normal mood, appropriate affect  Recent Labs: 11/21/2015: ALT 16*; BUN 27*; Creatinine, Ser 1.28*; Hemoglobin 13.5; Platelets 205; Potassium 4.4; Sodium 135   Lipid Panel No results found for: CHOL, TRIG, HDL, CHOLHDL, VLDL, LDLCALC, LDLDIRECT   Other studies Reviewed:  EKG:  The ekg from 5/16/2017was personally reviewed by me and it revealed SR 69 bpm, LAFB.  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 Recommend pharmacologic nuclear stress test, echocardiogram.  Moderate aortic stenosis Update echocardiogram  Hypertension BP is well controlled. Continue  monitoring BP. Continue current medical therapy and lifestyle changes.  Hyperlipidemia PCP following labs. LDL goal recommended is less than 70.   Current medicines are reviewed at length with the patient today.  The patient does not have concerns regarding medicines.  Labs/ tests ordered today include:  Orders Placed This Encounter  Procedures  . NM Myocar Multi W/Spect W/Wall Motion / EF  . EKG 12-Lead  . ECHOCARDIOGRAM COMPLETE    I had a lengthy and detailed discussion with the patient regarding diagnoses, prognosis, diagnostic options, treatment options .   I counseled the patient on importance of lifestyle modification including heart healthy diet, regular physical activity    Disposition:   FU with undersigned after tests   I spent at least 45 minutes with the patient today and more than 50% of the time was spent counseling the patient and coordinating care.   Signed, Wende Bushy, MD  01/03/2016 5:25 PM    [facility]

## 2016-01-04 ENCOUNTER — Ambulatory Visit: Payer: Medicare Other | Admitting: Anesthesiology

## 2016-01-04 ENCOUNTER — Ambulatory Visit
Admission: RE | Admit: 2016-01-04 | Discharge: 2016-01-04 | Disposition: A | Discharge status: Home / Self Care | Payer: Medicare Other | Source: Ambulatory Visit | Attending: Urology | Admitting: Urology

## 2016-01-04 ENCOUNTER — Telehealth: Payer: Self-pay | Admitting: Cardiology

## 2016-01-04 ENCOUNTER — Encounter
Admission: RE | Disposition: A | Discharge status: Home / Self Care | Payer: Self-pay | Source: Ambulatory Visit | Attending: Urology

## 2016-01-04 DIAGNOSIS — I10 Essential (primary) hypertension: Secondary | ICD-10-CM | POA: Insufficient documentation

## 2016-01-04 DIAGNOSIS — I252 Old myocardial infarction: Secondary | ICD-10-CM | POA: Diagnosis not present

## 2016-01-04 DIAGNOSIS — C7951 Secondary malignant neoplasm of bone: Secondary | ICD-10-CM | POA: Insufficient documentation

## 2016-01-04 DIAGNOSIS — Z7982 Long term (current) use of aspirin: Secondary | ICD-10-CM | POA: Diagnosis not present

## 2016-01-04 DIAGNOSIS — E119 Type 2 diabetes mellitus without complications: Secondary | ICD-10-CM | POA: Insufficient documentation

## 2016-01-04 DIAGNOSIS — Z809 Family history of malignant neoplasm, unspecified: Secondary | ICD-10-CM | POA: Diagnosis not present

## 2016-01-04 DIAGNOSIS — Z79899 Other long term (current) drug therapy: Secondary | ICD-10-CM | POA: Diagnosis not present

## 2016-01-04 DIAGNOSIS — E7800 Pure hypercholesterolemia, unspecified: Secondary | ICD-10-CM | POA: Insufficient documentation

## 2016-01-04 DIAGNOSIS — M1990 Unspecified osteoarthritis, unspecified site: Secondary | ICD-10-CM | POA: Diagnosis not present

## 2016-01-04 DIAGNOSIS — R590 Localized enlarged lymph nodes: Secondary | ICD-10-CM | POA: Diagnosis not present

## 2016-01-04 DIAGNOSIS — K219 Gastro-esophageal reflux disease without esophagitis: Secondary | ICD-10-CM | POA: Diagnosis not present

## 2016-01-04 DIAGNOSIS — Z87891 Personal history of nicotine dependence: Secondary | ICD-10-CM | POA: Diagnosis not present

## 2016-01-04 DIAGNOSIS — Z9079 Acquired absence of other genital organ(s): Secondary | ICD-10-CM | POA: Diagnosis not present

## 2016-01-04 DIAGNOSIS — Z8249 Family history of ischemic heart disease and other diseases of the circulatory system: Secondary | ICD-10-CM | POA: Diagnosis not present

## 2016-01-04 DIAGNOSIS — Z9049 Acquired absence of other specified parts of digestive tract: Secondary | ICD-10-CM | POA: Insufficient documentation

## 2016-01-04 DIAGNOSIS — N1330 Unspecified hydronephrosis: Secondary | ICD-10-CM | POA: Diagnosis not present

## 2016-01-04 DIAGNOSIS — I4892 Unspecified atrial flutter: Secondary | ICD-10-CM | POA: Diagnosis not present

## 2016-01-04 DIAGNOSIS — C61 Malignant neoplasm of prostate: Secondary | ICD-10-CM | POA: Insufficient documentation

## 2016-01-04 HISTORY — PX: CYSTOSCOPY W/ URETERAL STENT PLACEMENT: SHX1429

## 2016-01-04 LAB — GLUCOSE, CAPILLARY
GLUCOSE-CAPILLARY: 129 mg/dL — AB (ref 65–99)
Glucose-Capillary: 102 mg/dL — ABNORMAL HIGH (ref 65–99)

## 2016-01-04 SURGERY — CYSTOSCOPY, FLEXIBLE, WITH STENT REPLACEMENT
Anesthesia: General | Laterality: Left | Wound class: Clean Contaminated

## 2016-01-04 MED ORDER — GLYCOPYRROLATE 0.2 MG/ML IJ SOLN
INTRAMUSCULAR | Status: DC | PRN
  Administered 2016-01-04: 0.1 mg via INTRAVENOUS

## 2016-01-04 MED ORDER — ONDANSETRON HCL 4 MG/2ML IJ SOLN: 4.0000 mg | Freq: Once | INTRAMUSCULAR | Status: DC | PRN

## 2016-01-04 MED ORDER — IOTHALAMATE MEGLUMINE 17.2 % UR SOLN
URETHRAL | Status: DC | PRN
  Administered 2016-01-04: 15 mL via INTRAVESICAL

## 2016-01-04 MED ORDER — CEFAZOLIN SODIUM-DEXTROSE 2-4 GM/100ML-% IV SOLN
INTRAVENOUS | Status: AC
  Filled 2016-01-04: qty 100

## 2016-01-04 MED ORDER — CEFAZOLIN SODIUM-DEXTROSE 2-4 GM/100ML-% IV SOLN
2.0000 g | Freq: Once | INTRAVENOUS | Status: AC
  Administered 2016-01-04: 2 g via INTRAVENOUS

## 2016-01-04 MED ORDER — PROPOFOL 10 MG/ML IV BOLUS
INTRAVENOUS | Status: DC | PRN
  Administered 2016-01-04: 130 mg via INTRAVENOUS

## 2016-01-04 MED ORDER — SODIUM CHLORIDE 0.9 % IV SOLN: INTRAVENOUS | Status: DC

## 2016-01-04 MED ORDER — HYDROCODONE-ACETAMINOPHEN 5-325 MG PO TABS: 1.0000 | ORAL_TABLET | Freq: Four times a day (QID) | ORAL | Status: DC | PRN

## 2016-01-04 MED ORDER — FENTANYL CITRATE (PF) 100 MCG/2ML IJ SOLN
INTRAMUSCULAR | Status: DC | PRN
  Administered 2016-01-04: 50 ug via INTRAVENOUS

## 2016-01-04 MED ORDER — AMPICILLIN 500 MG PO CAPS: 500.0000 mg | ORAL_CAPSULE | Freq: Four times a day (QID) | ORAL | Status: DC

## 2016-01-04 MED ORDER — FENTANYL CITRATE (PF) 100 MCG/2ML IJ SOLN: 25.0000 ug | INTRAMUSCULAR | Status: DC | PRN

## 2016-01-04 MED ORDER — CHLORHEXIDINE GLUCONATE 4 % EX LIQD: 60.0000 mL | Freq: Once | CUTANEOUS | Status: DC

## 2016-01-04 MED ORDER — ONDANSETRON HCL 4 MG/2ML IJ SOLN
INTRAMUSCULAR | Status: DC | PRN
  Administered 2016-01-04: 4 mg via INTRAVENOUS

## 2016-01-04 SURGICAL SUPPLY — 20 items
BACTOSHIELD CHG 4% 4OZ (MISCELLANEOUS) ×2
CATH URETL 5X70 OPEN END (CATHETERS) ×3 IMPLANT
GLOVE BIO SURGEON STRL SZ7 (GLOVE) ×6 IMPLANT
GLOVE BIO SURGEON STRL SZ7.5 (GLOVE) ×3 IMPLANT
GOWN STRL REUS W/ TWL LRG LVL4 (GOWN DISPOSABLE) ×1 IMPLANT
GOWN STRL REUS W/TWL LRG LVL4 (GOWN DISPOSABLE) ×3
GOWN STRL REUS W/TWL XL LVL3 (GOWN DISPOSABLE) ×6 IMPLANT
KIT RM TURNOVER CYSTO AR (KITS) ×3 IMPLANT
PACK CYSTO AR (MISCELLANEOUS) ×3 IMPLANT
SCRUB CHG 4% DYNA-HEX 4OZ (MISCELLANEOUS) ×1 IMPLANT
SENSORWIRE 0.038 NOT ANGLED (WIRE) ×3
SET CYSTO W/LG BORE CLAMP LF (SET/KITS/TRAYS/PACK) ×3 IMPLANT
SOL .9 NS 3000ML IRR  AL (IV SOLUTION) ×2
SOL .9 NS 3000ML IRR AL (IV SOLUTION) ×1
SOL .9 NS 3000ML IRR UROMATIC (IV SOLUTION) ×1 IMPLANT
STENT URET 6FRX24 CONTOUR (STENTS) IMPLANT
STENT URET 6FRX26 CONTOUR (STENTS) ×3 IMPLANT
SURGILUBE 2OZ TUBE FLIPTOP (MISCELLANEOUS) ×3 IMPLANT
WATER STERILE IRR 1000ML POUR (IV SOLUTION) ×3 IMPLANT
WIRE SENSOR 0.038 NOT ANGLED (WIRE) ×1 IMPLANT

## 2016-01-04 NOTE — Anesthesia Postprocedure Evaluation (Signed)
Anesthesia Post Note  Patient: [name]  Procedure(s) Performed: Procedure(s) (LRB): CYSTOSCOPY WITH STENT REPLACEMENT (Left)  Patient location during evaluation: PACU Anesthesia Type: General Level of consciousness: awake and alert Pain management: pain level controlled Vital Signs Assessment: post-procedure vital signs reviewed and stable Respiratory status: spontaneous breathing, nonlabored ventilation, respiratory function stable and patient connected to nasal cannula oxygen Cardiovascular status: blood pressure returned to baseline and stable Postop Assessment: no signs of nausea or vomiting Anesthetic complications: no    Last Vitals:  Filed Vitals:   01/04/16 0857 01/04/16 0900  BP: 161/86 160/86  Pulse: 82 76  Temp: 35.9 C   Resp: 16     Last Pain: There were no vitals filed for this visit.               Miriam Kestler S

## 2016-01-04 NOTE — Telephone Encounter (Signed)
Cardiac clearance form faxed to Fabio Asa at [facility] and faxed forms placed in "Faxed" bin on [employer].

## 2016-01-04 NOTE — Anesthesia Procedure Notes (Signed)
Procedure Name: LMA Insertion Performed by: Lorie Apley Pre-anesthesia Checklist: Patient identified, Emergency Drugs available and Suction available Oxygen Delivery Method: Circle system utilized Preoxygenation: Pre-oxygenation with 100% oxygen Intubation Type: IV induction Ventilation: Mask ventilation without difficulty LMA Size: 5.0 Number of attempts: 1

## 2016-01-04 NOTE — Op Note (Signed)
Preoperative diagnosis:  1. Left hydronephrosis 2. Stage IV metastatic prostate cancer  Postoperative diagnosis:  1. Left hydronephrosis 2. Stage IV metastatic prostate cancer  Procedure: 1. Cystoscopy 2. Left ureteral stent exchange 6 [place] by 26 cm 3. Left retrograde pyelogram with interpertation  Surgeon: Baruch Gouty, MD  Anesthesia: General  Complications: None  Intraoperative findings: The stent was confirmed to be in the correct location on fluoroscopy.  EBL: None  Specimens: None  Drains: 6 French by 26 cm double-J left ureteral stent  Disposition: Stable to the postanesthesia care unit  Indication for procedure: The patient is a 75 y.o. male with stage IV metastatic prostate cancer with left hydroureteronephrosis presents today for left ureteral stent exchange. After reviewing the management options for treatment, the patient elected to proceed with the above surgical procedure(s). We have discussed the potential benefits and risks of the procedure, side effects of the proposed treatment, the likelihood of the patient achieving the goals of the procedure, and any potential problems that might occur during the procedure or recuperation. Informed consent has been obtained.  Description of procedure: The patient was met in the preoperative area. All risks, benefits, and indications of the procedure were described in great detail. The patient consented to the procedure. Preoperative antibiotics were given. The patient was taken to the operative theater. General anesthesia was induced per the anesthesia service. The patient was then placed in the dorsal lithotomy position and prepped and draped in the usual sterile fashion. A preoperative timeout was called. A 21 French 30 cystoscope was inserted into the patient's bladder per urethra atraumatically. The left ureteral stent was grasped flexible graspers brought to level urethral meatus. Prior to this though, fluoroscopy was  used to locate the level of the renal pelvis via stent curl. Sensor was exchanged left ureteral stent up to level the renal pelvis and the stent was removed.6 and opening catheter exchanged for the sensor wire and a retrograde pyelogram on the left side to place. This showed the patient still having persistent hydronephrosis. The open ended catheter was then exchanged for a sensor wire. The cystoscope was reassembled over the sensor wire inserted back into the patient's bladder. A 6 French by 26 cm double-J ureteral stent was then placed over the sensor wire and advanced to thelevel of the renal pelvis under fluoroscopy. The sensor wire was removed. A curl seen was seen in the patient's left renal pelvis indicating proximal correct placement on fluoroscopy. The correct distal placement of the stent was confirmed with direct position within the patient's urinary bladder. There was clear yellow urine draining from the stent further confirming correct placement. The patient's bladder was then drained. The patient was woke from anesthesia and transferred to postanesthesia care unit without apparent complication.  Plan: The patient will follow-up in 3 months for repeat stent exchange. His metastatic stage IV prostate cancer is managed via Lupron by medical oncology.  Baruch Gouty, M.D.

## 2016-01-04 NOTE — Discharge Instructions (Signed)
AMBULATORY SURGERY  °DISCHARGE INSTRUCTIONS ° ° °1) The drugs that you were given will stay in your system until tomorrow so for the next 24 hours you should not: ° °A) Drive an automobile °B) Make any legal decisions °C) Drink any alcoholic beverage ° ° °2) You may resume regular meals tomorrow.  Today it is better to start with liquids and gradually work up to solid foods. ° °You may eat anything you prefer, but it is better to start with liquids, then soup and crackers, and gradually work up to solid foods. ° ° °3) Please notify your doctor immediately if you have any unusual bleeding, trouble breathing, redness and pain at the surgery site, drainage, fever, or pain not relieved by medication. ° ° ° °4) Additional Instructions: ° ° ° ° ° ° ° °Please contact your physician with any problems or Same Day Surgery at [phone], Monday through Friday 6 am to 4 pm, or [place] [facility] [place] Main number at [phone].AMBULATORY SURGERY  °DISCHARGE INSTRUCTIONS ° ° °5) The drugs that you were given will stay in your system until tomorrow so for the next 24 hours you should not: ° °D) Drive an automobile °E) Make any legal decisions °F) Drink any alcoholic beverage ° ° °6) You may resume regular meals tomorrow.  Today it is better to start with liquids and gradually work up to solid foods. ° °You may eat anything you prefer, but it is better to start with liquids, then soup and crackers, and gradually work up to solid foods. ° ° °7) Please notify your doctor immediately if you have any unusual bleeding, trouble breathing, redness and pain at the surgery site, drainage, fever, or pain not relieved by medication. ° ° ° °8) Additional Instructions: ° ° ° ° ° ° ° °Please contact your physician with any problems or Same Day Surgery at [phone], Monday through Friday 6 am to 4 pm, or [place] at [place] Main number at [phone]. °

## 2016-01-04 NOTE — Transfer of Care (Signed)
Immediate Anesthesia Transfer of Care Note  Patient: [name]  Procedure(s) Performed: Procedure(s): CYSTOSCOPY WITH STENT REPLACEMENT (Left)  Patient Location: PACU  Anesthesia Type:General  Level of Consciousness: sedated  Airway & Oxygen Therapy: Patient Spontanous Breathing and Patient connected to face mask oxygen  Post-op Assessment: Report given to RN and Post -op Vital signs reviewed and stable  Post vital signs: Reviewed and stable  Last Vitals:  Filed Vitals:   01/04/16 0609 01/04/16 0808  BP: 165/76 130/79  Pulse: 66 74  Temp: 36.7 C 36.4 C  Resp: 17 18    Last Pain: There were no vitals filed for this visit.       Complications: No apparent anesthesia complications

## 2016-01-04 NOTE — H&P (Addendum)
Patient Information    Patient Name Sex DOB SSN   [name], [name] Male 09-01-1940 [id number]    H&P (View-Only) by Nickie Retort, MD at 09/16/2015 3:20 PM    Author: Nickie Retort, MD Service: Urology Author Type: Physician   Filed: 09/16/2015 3:38 PM Note Time: 09/16/2015 3:20 PM Status: Signed   Editor: Nickie Retort, MD (Physician)     Expand All Collapse All        [name] 27-Mar-1941 [id number]  Referring provider: Tamsen Roers, MD [address], [place] [address]  Chief Complaint  Patient presents with  . Pre-op Exam    Hydronephrosis with ureteral stricture    HPI: The patient is a 75 year old gentleman with a known past medical history of stage IV metastatic prostate cancer. He presents today with left hydroureteronephrosis. This was seen on both the recent CT scan and bone scan. He also has what appears to be a mass in his prostatics bed and possibly growing into his bladder. He had a radical prostatectomy in 1992. We do not have records from this procedure. The patient does not remember where this was done. He saw Dr. Elnoria Howard a few weeks ago and noted a large firm mass at the base the bladder. He now has what appears to be this newfound left hydronephrosis. His creatinine is 1.6. He sees Dr. Leia Alf recently started him on Lupron. His most recent PSA prior to Lupron was 48.31 month ago.  January 2017 Interval history: The patient underwent a antegrade left ureteral stent placement for his left hydronephrosis after unsuccessful attempt at placing a left ureteral stent. He presented today to discuss management of his stent as he is due for change. He did have a recent renal ultrasound for left flank pain that showed the stent in place draining well. It did show a possible stone on the right side 9 mm in side. However he did get a CT scan to 6 months ago which was showed 1-2 mm punctate stones so I feel that this is a shadow not a true stone.  He also has chronic pain from his metastatic prostate cancer. He is on tramadol amount is out of pills. He states it does not work for him at this time.   PMH: Past Medical History  Diagnosis Date  . Diabetes mellitus without complication ([place])   . Hypertension   . Hypercholesteremia   . GERD (gastroesophageal reflux disease)   . Prostate cancer ([place])     metastatic  . Arthritis   . Heart murmur   . Aortic stenosis, moderate 01/30/2014  . Atrial flutter ([place]) 02/02/2014  . HTN (hypertension) 01/30/2014  . NSTEMI (non-ST elevated myocardial infarction) ([place]) 01/30/2014    Surgical History: Past Surgical History  Procedure Laterality Date  . Prostatectomy  1994  . Colon resection    . Eye surgery    . Cystoscopy w/ retrogrades Left 06/08/2015    Procedure: CYSTOSCOPY WITH RETROGRADE PYELOGRAM; Surgeon: Nickie Retort, MD; Location: [facility] ORS; Service: Urology; Laterality: Left;    Home Medications:    Medication List       This list is accurate as of: 09/16/15 3:21 PM. Always use your most recent med list.              allopurinol 300 MG tablet  Commonly known as: ZYLOPRIM  Take 300 mg by mouth daily.     aspirin 81 MG chewable tablet  Chew 1 tablet (81  mg total) by mouth daily.     atorvastatin 10 MG tablet  Commonly known as: LIPITOR     carvedilol 6.25 MG tablet  Commonly known as: COREG  Take 1 tablet (6.25 mg total) by mouth 2 (two) times daily with a meal.     ferrous sulfate 325 (65 FE) MG tablet  Take 325 mg by mouth 2 (two) times daily with a meal.     lovastatin 20 MG tablet  Commonly known as: MEVACOR  Take 20 mg by mouth daily. Reported on 09/16/2015     oxybutynin 5 MG tablet  Commonly known as: DITROPAN  Take 5 mg by mouth 4 (four) times daily.     pantoprazole 40 MG tablet  Commonly known as: PROTONIX  Take 1 tablet  (40 mg total) by mouth daily before breakfast.     ranitidine 150 MG tablet  Commonly known as: ZANTAC  Take 150 mg by mouth 2 (two) times daily.     traMADol 50 MG tablet  Commonly known as: ULTRAM  Take 1 tablet (50 mg total) by mouth 2 (two) times daily as needed.     verapamil 240 MG CR tablet  Commonly known as: CALAN-SR  Take 1 tablet (240 mg total) by mouth daily.     VITAMIN B 12 PO  Take 1,000 mcg by mouth daily.        Allergies: No Known Allergies  Family History: Family History  Problem Relation Age of Onset  . Aneurysm Father   . Cancer Mother     Social History:  reports that he quit smoking about 41 years ago. His smoking use included Cigarettes. He has a 37.5 pack-year smoking history. He has never used smokeless tobacco. He reports that he does not drink alcohol or use illicit drugs.  ROS: UROLOGY Frequent Urination?: No Hard to postpone urination?: No Burning/pain with urination?: No Get up at night to urinate?: Yes Leakage of urine?: Yes Urine stream starts and stops?: No Trouble starting stream?: No Do you have to strain to urinate?: No Blood in urine?: No Urinary tract infection?: No Sexually transmitted disease?: No Injury to kidneys or bladder?: No Painful intercourse?: No Weak stream?: No Erection problems?: No Penile pain?: No  Gastrointestinal Nausea?: No Vomiting?: No Indigestion/heartburn?: No Diarrhea?: No Constipation?: No  Constitutional Fever: No Night sweats?: No Weight loss?: No Fatigue?: No  Skin Skin rash/lesions?: No Itching?: No  Eyes Blurred vision?: No Double vision?: No  Ears/Nose/Throat Sore throat?: No Sinus problems?: No  Hematologic/Lymphatic Swollen glands?: No Easy bruising?: No  Cardiovascular Leg swelling?: No Chest pain?: No  Respiratory Cough?: No Shortness of breath?: No  Endocrine Excessive thirst?: Yes  Musculoskeletal Back pain?:  Yes Joint pain?: No  Neurological Headaches?: No Dizziness?: No  Psychologic Depression?: No Anxiety?: No  Physical Exam: BP 124/66 mmHg  Pulse 69  Ht 5\' 11"  (1.803 m)  Wt 214 lb 14.4 oz (97.478 kg)  BMI 29.99 kg/m2  Constitutional: Alert and oriented, No acute distress. HEENT: [place] AT, moist mucus membranes. Trachea midline, no masses. Cardiovascular: No clubbing, cyanosis, or edema. RRR Respiratory: Normal respiratory effort, no increased work of breathing. GI: Abdomen is soft, nontender, nondistended, no abdominal masses GU: No CVA tenderness. Skin: No rashes, bruises or suspicious lesions. Lymph: No cervical or inguinal adenopathy. Neurologic: Grossly intact, no focal deficits, moving all 4 extremities. Psychiatric: Normal mood and affect.  Laboratory Data:  Recent Labs    Lab Results  Component Value Date  WBC 10.1 08/01/2015   HGB 13.6 08/01/2015   HCT 41.4 08/01/2015   MCV 87.1 08/01/2015   PLT 183 08/01/2015       Recent Labs    Lab Results  Component Value Date   CREATININE 1.40* 08/01/2015       Recent Labs    Lab Results  Component Value Date   PSA 1.39 07/04/2015   PSA 48.22* 03/24/2015       Recent Labs    Lab Results  Component Value Date   TESTOSTERONE 228* 03/24/2015       Recent Labs    Lab Results  Component Value Date   HGBA1C 5.5 01/30/2014      Urinalysis  Labs (Brief)       Component Value Date/Time   COLORURINE YELLOW 01/13/2014 2146   APPEARANCEUR CLOUDY* 01/13/2014 2146   LABSPEC 1.017 01/13/2014 2146   PHURINE 5.0 01/13/2014 2146   GLUCOSEU Negative 05/10/2015 1335   HGBUR LARGE* 01/13/2014 2146   BILIRUBINUR Negative 05/10/2015 1335   BILIRUBINUR NEGATIVE 01/13/2014 2146   KETONESUR NEGATIVE 01/13/2014 2146   PROTEINUR 100* 01/13/2014 2146   UROBILINOGEN 0.2 01/13/2014 2146   NITRITE  Negative 05/10/2015 1335   NITRITE NEGATIVE 01/13/2014 2146   LEUKOCYTESUR Negative 05/10/2015 1335   LEUKOCYTESUR NEGATIVE 01/13/2014 2146       Assessment & Plan:    1. Left Hydroureteronephrosis The patient is due for a left ureteral stent exchange.   2. Metastatic prostate cancer - Stage IV with metastases to lumbar spine, pelvic lymphadenopathy  He is under the care of Dr. Rogue Bussing on Lupron and Delton See for bone mets. We will defer further management to medical oncology.    No Follow-up on file.  Nickie Retort, MD  [facility] [address] [place], [place] [address] [phone]

## 2016-01-04 NOTE — Anesthesia Preprocedure Evaluation (Addendum)
Anesthesia Evaluation  Patient identified by MRN, date of birth, ID band Patient awake    Reviewed: Allergy & Precautions, NPO status , Patient's Chart, lab work & pertinent test results, reviewed documented beta blocker date and time   Airway Mallampati: III  TM Distance: >3 FB     Dental  (+) Chipped   Pulmonary pneumonia, resolved, former smoker,           Cardiovascular hypertension, Pt. on medications and Pt. on home beta blockers + Past MI  + Valvular Problems/Murmurs      Neuro/Psych    GI/Hepatic GERD  Controlled,  Endo/Other  diabetes, Type 2  Renal/GU Renal InsufficiencyRenal disease     Musculoskeletal  (+) Arthritis ,   Abdominal   Peds  Hematology  (+) anemia ,   Anesthesia Other Findings He denies MI. Denies dysphagia. EF 70 according to echo last year. L leg stillweak, uses cane. Hx of AF.  Reproductive/Obstetrics                            Anesthesia Physical Anesthesia Plan  ASA: III  Anesthesia Plan: General   Post-op Pain Management:    Induction: Intravenous  Airway Management Planned: LMA  Additional Equipment:   Intra-op Plan:   Post-operative Plan:   Informed Consent: I have reviewed the patients History and Physical, chart, labs and discussed the procedure including the risks, benefits and alternatives for the proposed anesthesia with the patient or authorized representative who has indicated his/her understanding and acceptance.     Plan Discussed with: CRNA  Anesthesia Plan Comments:         Anesthesia Quick Evaluation

## 2016-01-05 LAB — CULTURE, URINE COMPREHENSIVE

## 2016-01-12 ENCOUNTER — Telehealth: Payer: Self-pay | Admitting: Cardiology

## 2016-01-12 NOTE — Telephone Encounter (Signed)
Patient just wanted to review instructions for his stress test tomorrow. Reviewed all instructions and he verbalized understanding with no further questions.

## 2016-01-12 NOTE — Telephone Encounter (Signed)
Pt calling having some questions about testing he has scheduling tomorrow Please call back.

## 2016-01-12 NOTE — Telephone Encounter (Signed)
Left detailed voicemail message regarding stress test instructions for tomorrow and instructions for patient to call back.

## 2016-01-13 ENCOUNTER — Encounter
Admission: RE | Admit: 2016-01-13 | Discharge: 2016-01-13 | Disposition: A | Discharge status: Home / Self Care | Payer: Medicare Other | Source: Ambulatory Visit | Attending: Cardiology | Admitting: Cardiology

## 2016-01-13 DIAGNOSIS — E785 Hyperlipidemia, unspecified: Secondary | ICD-10-CM | POA: Insufficient documentation

## 2016-01-13 DIAGNOSIS — I350 Nonrheumatic aortic (valve) stenosis: Secondary | ICD-10-CM | POA: Insufficient documentation

## 2016-01-13 DIAGNOSIS — Z01810 Encounter for preprocedural cardiovascular examination: Secondary | ICD-10-CM | POA: Insufficient documentation

## 2016-01-13 DIAGNOSIS — I10 Essential (primary) hypertension: Secondary | ICD-10-CM

## 2016-01-13 LAB — NM MYOCAR MULTI W/SPECT W/WALL MOTION / EF
CHL CUP MPHR: 146 {beats}/min
CSEPEW: 1 METS
CSEPHR: 53 %
CSEPPHR: 78 {beats}/min
Exercise duration (min): 0 min
Exercise duration (sec): 0 s
LVDIAVOL: 91 mL (ref 62–150)
LVSYSVOL: 41 mL
NUC STRESS TID: 0.95
Rest HR: 61 {beats}/min
SDS: 1
SRS: 2
SSS: 3

## 2016-01-13 MED ORDER — TECHNETIUM TC 99M TETROFOSMIN IV KIT
13.0000 | PACK | Freq: Once | INTRAVENOUS | Status: AC | PRN
  Administered 2016-01-13: 12.631 via INTRAVENOUS

## 2016-01-13 MED ORDER — TECHNETIUM TC 99M TETROFOSMIN IV KIT
31.6470 | PACK | Freq: Once | INTRAVENOUS | Status: AC | PRN
  Administered 2016-01-13: 31.647 via INTRAVENOUS

## 2016-01-13 MED ORDER — REGADENOSON 0.4 MG/5ML IV SOLN
0.4000 mg | Freq: Once | INTRAVENOUS | Status: AC
  Administered 2016-01-13: 0.4 mg via INTRAVENOUS

## 2016-01-18 ENCOUNTER — Other Ambulatory Visit: Payer: Self-pay

## 2016-01-18 ENCOUNTER — Ambulatory Visit (INDEPENDENT_AMBULATORY_CARE_PROVIDER_SITE_OTHER): Payer: Medicare Other

## 2016-01-18 DIAGNOSIS — Z01810 Encounter for preprocedural cardiovascular examination: Secondary | ICD-10-CM | POA: Diagnosis not present

## 2016-01-18 DIAGNOSIS — I10 Essential (primary) hypertension: Secondary | ICD-10-CM | POA: Diagnosis not present

## 2016-01-18 DIAGNOSIS — I350 Nonrheumatic aortic (valve) stenosis: Secondary | ICD-10-CM

## 2016-01-18 DIAGNOSIS — E785 Hyperlipidemia, unspecified: Secondary | ICD-10-CM

## 2016-01-24 ENCOUNTER — Ambulatory Visit (INDEPENDENT_AMBULATORY_CARE_PROVIDER_SITE_OTHER): Payer: Medicare Other | Admitting: Cardiology

## 2016-01-24 ENCOUNTER — Encounter: Payer: Self-pay | Admitting: Cardiology

## 2016-01-24 VITALS — BP 152/90 | HR 72 | Ht 71.0 in | Wt 198.8 lb

## 2016-01-24 DIAGNOSIS — Z01810 Encounter for preprocedural cardiovascular examination: Secondary | ICD-10-CM | POA: Diagnosis not present

## 2016-01-24 DIAGNOSIS — I350 Nonrheumatic aortic (valve) stenosis: Secondary | ICD-10-CM | POA: Diagnosis not present

## 2016-01-24 DIAGNOSIS — I10 Essential (primary) hypertension: Secondary | ICD-10-CM | POA: Diagnosis not present

## 2016-01-24 DIAGNOSIS — E785 Hyperlipidemia, unspecified: Secondary | ICD-10-CM

## 2016-01-24 NOTE — Progress Notes (Signed)
Cardiology Office Note   Date:  01/24/2016   ID:  [name], DOB 02-27-1941, MRN [id number]  Referring Doctor:  Tamsen Roers, MD   Cardiologist:   Wende Bushy, MD   Reason for consultation:  Chief Complaint  Patient presents with  . other    Follow up from [facility]; [place] [facility]. Meds reviewed by the patient verbally.       History of Present Illness: [name] is a 75 y.o. male who presents for follow up after tests Patient doing well. Patient denies chest pain, shortness of breath, palpitations, lightheadedness, syncope. He has limited functional capacity/walking ability due to nonhealing ulcer on the left foot/toe, he walks with a cane.  Patient denies fever, cough, colds, abdominal pain. No PND, orthopnea, edema.  ROS:  Please see the history of present illness. Aside from mentioned under HPI, all other systems are reviewed and negative.     Past Medical History  Diagnosis Date  . Diabetes mellitus without complication ([place])   . Hypertension   . Hypercholesteremia   . GERD (gastroesophageal reflux disease)   . Arthritis   . Heart murmur   . Aortic stenosis, moderate 01/30/2014  . Atrial flutter ([place]) 02/02/2014  . HTN (hypertension) 01/30/2014  . NSTEMI (non-ST elevated myocardial infarction) ([place]) 01/30/2014  . Heart murmur   . Weakness of left leg   . Prostate cancer [facility])     metastatic   Demand ischemia and atrial flutter back in 2015 in the setting of acute renal failure, septic shock   Past Surgical History  Procedure Laterality Date  . Prostatectomy  1994  . Colon resection    . Eye surgery    . Cystoscopy w/ retrogrades Left 06/08/2015    Procedure: CYSTOSCOPY WITH RETROGRADE PYELOGRAM;  Surgeon: Nickie Retort, MD;  Location: [facility] ORS;  Service: Urology;  Laterality: Left;  . Cystoscopy w/ ureteral stent placement Left 10/05/2015    Procedure: CYSTOSCOPY WITH STENT REPLACEMENT;  Surgeon: Nickie Retort, MD;  Location: [facility] ORS;  Service:  Urology;  Laterality: Left;  . Cardiac catheterization      [facility]  . Cataract extraction w/ intraocular lens implant Bilateral   . Cystoscopy w/ ureteral stent placement Left 01/04/2016    Procedure: CYSTOSCOPY WITH STENT REPLACEMENT;  Surgeon: Nickie Retort, MD;  Location: [facility] ORS;  Service: Urology;  Laterality: Left;     reports that he quit smoking about 42 years ago. His smoking use included Cigarettes. He has a 37.5 pack-year smoking history. He has never used smokeless tobacco. He reports that he does not drink alcohol or use illicit drugs.   family history includes Aneurysm in his father; Cancer in his mother.   Current Outpatient Prescriptions  Medication Sig Dispense Refill  . allopurinol (ZYLOPRIM) 300 MG tablet Take 300 mg by mouth daily.    Marland Kitchen aspirin 81 MG tablet Take 81 mg by mouth daily. Reported on 01/03/2016    . atorvastatin (LIPITOR) 10 MG tablet Take 10 mg by mouth every evening. Reported on 01/04/2016    . carvedilol (COREG) 6.25 MG tablet Take 1 tablet (6.25 mg total) by mouth 2 (two) times daily with a meal. 60 tablet 2  . Cyanocobalamin (VITAMIN B 12 PO) Take 1,000 mcg by mouth daily.    . ferrous sulfate 325 (65 FE) MG tablet Take 325 mg by mouth 2 (two) times daily with a meal.    . oxybutynin (DITROPAN) 5 MG tablet Take 5 mg  by mouth 4 (four) times daily.    . pantoprazole (PROTONIX) 40 MG tablet Take 1 tablet (40 mg total) by mouth daily before breakfast. 30 tablet 2  . ranitidine (ZANTAC) 150 MG tablet Take 150 mg by mouth at bedtime.     . verapamil (CALAN-SR) 240 MG CR tablet Take 1 tablet (240 mg total) by mouth daily. (Patient taking differently: Take 240 mg by mouth 2 (two) times daily. ) 30 tablet 2   No current facility-administered medications for this visit.   Facility-Administered Medications Ordered in Other Visits  Medication Dose Route Frequency Provider Last Rate Last Dose  . 0.9 %  sodium chloride infusion   Intravenous Continuous Wylene Simmer, MD        Allergies: Review of patient's allergies indicates no known allergies.    PHYSICAL EXAM: VS:  BP 152/90 mmHg  Pulse 72  Ht 5\' 11"  (1.803 m)  Wt 198 lb 12 oz (90.152 kg)  BMI 27.73 kg/m2  SpO2 98% , Body mass index is 27.73 kg/(m^2). Wt Readings from Last 3 Encounters:  01/24/16 198 lb 12 oz (90.152 kg)  01/04/16 197 lb (89.359 kg)  01/03/16 197 lb 12.8 oz (89.721 kg)    GENERAL:  well developed, well nourished, not in acute distress HEENT: normocephalic, pink conjunctivae, anicteric sclerae, no xanthelasma, normal dentition, oropharynx clear NECK:  no neck vein engorgement, JVP normal, no hepatojugular reflux, carotid upstroke brisk and symmetric, no bruit, no thyromegaly, no lymphadenopathy LUNGS:  good respiratory effort, clear to auscultation bilaterally CV:  PMI not displaced, no thrills, no lifts, S1 and S2 within normal limits, no palpable S3 or S4, 2 to 3/6 systolic ejection murmur, no rubs, no gallops ABD:  Soft, nontender, nondistended, normoactive bowel sounds, no abdominal aortic bruit, no hepatomegaly, no splenomegaly MS: nontender back, no kyphosis, no scoliosis, no joint deformities EXT:  2+ DP/PT pulses, no edema, no varicosities, no cyanosis, no clubbing SKIN: warm, nondiaphoretic, normal turgor, no ulcers NEUROPSYCH: alert, oriented to person, place, and time, sensory/motor grossly intact, normal mood, appropriate affect  Recent Labs: 11/21/2015: ALT 16*; BUN 27*; Creatinine, Ser 1.28*; Hemoglobin 13.5; Platelets 205; Potassium 4.4; Sodium 135   Lipid Panel No results found for: CHOL, TRIG, HDL, CHOLHDL, VLDL, LDLCALC, LDLDIRECT   Other studies Reviewed:  EKG:  The ekg from 5/16/2017was personally reviewed by me and it revealed SR 69 bpm, LAFB.  Additional studies/ records that were reviewed personally reviewed by me today include:  Echocardiogram 01/18/2016: Left ventricle: The cavity size was mildly dilated. Wall  thickness was normal.  Systolic function was normal. The estimated  ejection fraction was in the range of 55% to 60%. Left  ventricular diastolic function parameters were normal for the  patient&'s age. - Aortic valve: Moderately calcified annulus. Moderately calcified  leaflets. There was moderate stenosis. There was moderate  regurgitation. Valve area (VTI): 1.16 cm^2. Valve area (Vmax):  1.16 cm^2. Valve area (Vmean): 1.17 cm^2. - Mitral valve: There was mild regurgitation. - Tricuspid valve: There was moderate regurgitation.  Pharmacologic stress test 01/13/2016: Pharmacological myocardial perfusion imaging study with no significant ischemia Normal wall motion, EF estimated at 55% Mild GI uptake artifact noted, small region of mild fixed apical thinning likely secondary to attenuation artifact No EKG changes concerning for ischemia at peak stress or in recovery. 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 Patient has no symptoms of  chest pain or shortness of breath. No evidence of congestive heart failure. Patient has moderate AS, stable from before. Patient has intermediate cardiac risk prior to planned moderate risk procedure. No indication for further testing at this point.  Moderate aortic stenosis Stable from before. No symptoms. Continue medical therapy with blood pressure control, avoiding drops in blood pressure. Maintaining hydration.  Hypertension BP is well controlled at home. Systolics in the [id number] to 0000000. Continue monitoring BP. Continue current medical therapy and lifestyle changes.  Hyperlipidemia PCP following labs. LDL goal recommended is less than 70.   Current medicines are reviewed at length with the patient today.  The patient does not have concerns regarding medicines.  Labs/ tests ordered today include:  No orders of the defined types were placed in this encounter.     I had a lengthy and detailed discussion with the patient regarding diagnoses, prognosis, diagnostic options, treatment options .   I counseled the patient on importance of lifestyle modification including heart healthy diet, regular physical activity    Disposition:   FU with undersigned in one year    Signed, Wende Bushy, MD  01/24/2016 3:37 PM    [place]

## 2016-01-24 NOTE — Patient Instructions (Signed)
Medication Instructions:  Your physician recommends that you continue on your current medications as directed. Please refer to the Current Medication list given to you today.   Labwork: None ordered  Testing/Procedures: None ordered  Follow-Up: Your physician wants you to follow-up in: 1 year with Dr. Yvone Neu. You will receive a reminder letter in the mail two months in advance. If you don't receive a letter, please call our office to schedule the follow-up appointment.   Any Other Special Instructions Will Be Listed Below (If Applicable).     If you need a refill on your cardiac medications before your next appointment, please call your pharmacy.

## 2016-02-28 ENCOUNTER — Other Ambulatory Visit: Payer: Self-pay | Admitting: *Deleted

## 2016-02-28 DIAGNOSIS — C7951 Secondary malignant neoplasm of bone: Principal | ICD-10-CM

## 2016-02-28 DIAGNOSIS — C61 Malignant neoplasm of prostate: Secondary | ICD-10-CM

## 2016-02-29 ENCOUNTER — Telehealth: Payer: Self-pay

## 2016-02-29 NOTE — Telephone Encounter (Signed)
Patient had received a call that said he needed to bring his medication to his next appointment.  Patient stated that he had been coming here for years and didn't know why he needed to bring it.  We discussed the reasons and patient stated that he understood.

## 2016-03-01 ENCOUNTER — Inpatient Hospital Stay: Payer: Medicare Other

## 2016-03-01 ENCOUNTER — Inpatient Hospital Stay: Payer: Medicare Other | Attending: Internal Medicine | Admitting: Internal Medicine

## 2016-03-01 VITALS — BP 152/81 | HR 69 | Temp 96.5°F | Resp 18 | Wt 198.9 lb

## 2016-03-01 DIAGNOSIS — E119 Type 2 diabetes mellitus without complications: Secondary | ICD-10-CM | POA: Diagnosis not present

## 2016-03-01 DIAGNOSIS — N1330 Unspecified hydronephrosis: Secondary | ICD-10-CM | POA: Diagnosis not present

## 2016-03-01 DIAGNOSIS — C61 Malignant neoplasm of prostate: Secondary | ICD-10-CM

## 2016-03-01 DIAGNOSIS — C7951 Secondary malignant neoplasm of bone: Secondary | ICD-10-CM | POA: Diagnosis not present

## 2016-03-01 DIAGNOSIS — M25562 Pain in left knee: Secondary | ICD-10-CM | POA: Diagnosis not present

## 2016-03-01 DIAGNOSIS — R011 Cardiac murmur, unspecified: Secondary | ICD-10-CM

## 2016-03-01 DIAGNOSIS — L089 Local infection of the skin and subcutaneous tissue, unspecified: Secondary | ICD-10-CM | POA: Insufficient documentation

## 2016-03-01 DIAGNOSIS — I350 Nonrheumatic aortic (valve) stenosis: Secondary | ICD-10-CM | POA: Diagnosis not present

## 2016-03-01 DIAGNOSIS — I10 Essential (primary) hypertension: Secondary | ICD-10-CM | POA: Diagnosis not present

## 2016-03-01 DIAGNOSIS — M129 Arthropathy, unspecified: Secondary | ICD-10-CM | POA: Insufficient documentation

## 2016-03-01 DIAGNOSIS — Z87891 Personal history of nicotine dependence: Secondary | ICD-10-CM | POA: Insufficient documentation

## 2016-03-01 DIAGNOSIS — I252 Old myocardial infarction: Secondary | ICD-10-CM

## 2016-03-01 DIAGNOSIS — Z803 Family history of malignant neoplasm of breast: Secondary | ICD-10-CM | POA: Insufficient documentation

## 2016-03-01 DIAGNOSIS — K219 Gastro-esophageal reflux disease without esophagitis: Secondary | ICD-10-CM | POA: Diagnosis not present

## 2016-03-01 DIAGNOSIS — E7800 Pure hypercholesterolemia, unspecified: Secondary | ICD-10-CM | POA: Insufficient documentation

## 2016-03-01 LAB — COMPREHENSIVE METABOLIC PANEL
ALT: 12 U/L — AB (ref 17–63)
AST: 21 U/L (ref 15–41)
Albumin: 4.1 g/dL (ref 3.5–5.0)
Alkaline Phosphatase: 70 U/L (ref 38–126)
Anion gap: 6 (ref 5–15)
BILIRUBIN TOTAL: 0.4 mg/dL (ref 0.3–1.2)
BUN: 23 mg/dL — AB (ref 6–20)
CHLORIDE: 105 mmol/L (ref 101–111)
CO2: 26 mmol/L (ref 22–32)
CREATININE: 1.14 mg/dL (ref 0.61–1.24)
Calcium: 10.1 mg/dL (ref 8.9–10.3)
GFR calc Af Amer: >60 mL/min (ref >60)
Glucose, Bld: 105 mg/dL — ABNORMAL HIGH (ref 65–99)
Potassium: 4.5 mmol/L (ref 3.5–5.1)
Sodium: 137 mmol/L (ref 135–145)
TOTAL PROTEIN: 7.6 g/dL (ref 6.5–8.1)

## 2016-03-01 LAB — CBC WITH DIFFERENTIAL/PLATELET
BASOS ABS: 0 10*3/uL (ref 0–0.1)
Basophils Relative: 0 %
Eosinophils Absolute: 0.4 10*3/uL (ref 0–0.7)
Eosinophils Relative: 5 %
HEMATOCRIT: 41.4 % (ref 40.0–52.0)
HEMOGLOBIN: 13.8 g/dL (ref 13.0–18.0)
LYMPHS PCT: 20 %
Lymphs Abs: 2 10*3/uL (ref 1.0–3.6)
MCH: 29.1 pg (ref 26.0–34.0)
MCHC: 33.4 g/dL (ref 32.0–36.0)
MCV: 87.3 fL (ref 80.0–100.0)
Monocytes Absolute: 0.9 10*3/uL (ref 0.2–1.0)
Monocytes Relative: 9 %
NEUTROS ABS: 6.6 10*3/uL — AB (ref 1.4–6.5)
NEUTROS PCT: 66 %
Platelets: 190 10*3/uL (ref 150–440)
RBC: 4.75 MIL/uL (ref 4.40–5.90)
RDW: 15.9 % — ABNORMAL HIGH (ref 11.5–14.5)
WBC: 10 10*3/uL (ref 3.8–10.6)

## 2016-03-01 MED ORDER — DENOSUMAB 120 MG/1.7ML ~~LOC~~ SOLN
120.0000 mg | Freq: Once | SUBCUTANEOUS | Status: AC
  Administered 2016-03-01: 120 mg via SUBCUTANEOUS
  Filled 2016-03-01: qty 1.7

## 2016-03-01 MED ORDER — LEUPROLIDE ACETATE (4 MONTH) 30 MG IM KIT
30.0000 mg | PACK | Freq: Once | INTRAMUSCULAR | Status: AC
  Administered 2016-03-01: 30 mg via INTRAMUSCULAR
  Filled 2016-03-01: qty 30

## 2016-03-01 NOTE — Assessment & Plan Note (Addendum)
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 PSA from today pending. No clinical progression noted  #  left hip/left knee pain- This is unlikely related to his prostate cancer. Question arthritis. Currently stable   # Left ureteral stent/hydronephrosis- likely from pelvic adenopathy from his malignancy. Plan stent exchange next month.   # Follow-up in 4 months; Lupron Xgeva ;   # 25 minutes face-to-face with the patient discussing the above plan of care; more than 50% of time spent on prognosis/ natural history; counseling and coordination.

## 2016-03-01 NOTE — Progress Notes (Signed)
Patient asking for referral to podiatrist.  Having problem with infection in his left 4th toe.

## 2016-03-01 NOTE — Progress Notes (Signed)
[place] OFFICE PROGRESS NOTE  Patient Care Team: Tamsen Roers, MD as PCP - General (Family Medicine)   SUMMARY OF ONCOLOGIC HISTORY:  Oncology History   # AUG 2016- METASTATIC PROSTATE CA/ STAGE IV; Castrate sensitive [PSA- 48] ; mets- lumbar spine [s/p pal RT to Aug 2016; Dr.Crystal] /Pelvic LN; 1994- Prostate CA s/p Surgery [facility]]; Lupron q4 M; MARCH 2017- Bone scan- L4/Right pubic rami uptake; PSA- 0.1/testosterone-castrate [<3]  # Bone lesions on denosumab;DEC 2016- Recm q 6M  # Left hydronephrosis s/p stenting     Prostate cancer metastatic to bone ([place])   04/11/2015 Initial Diagnosis Prostate cancer metastatic to bone [facility])    INTERVAL HISTORY:  A very pleasant 75 year old male patient with above history of prostate cancer stage IV hormone sensitive currently on Lupron every 4 months is here for follow-up.  His appetite is good. He is not losing any weight. No hot flashes. States the pain is stable. His not getting worse. Denies any worsening back pain  His appetite is good otherwise. His not losing any weight.  Denies any hot flashes.  Complains of infection the foot   REVIEW OF SYSTEMS:  A complete 10 point review of system is done which is negative except mentioned above/history of present illness.   PAST MEDICAL HISTORY :  Past Medical History  Diagnosis Date  . Diabetes mellitus without complication ([place])   . Hypertension   . Hypercholesteremia   . GERD (gastroesophageal reflux disease)   . Arthritis   . Heart murmur   . Aortic stenosis, moderate 01/30/2014  . Atrial flutter ([place]) 02/02/2014  . HTN (hypertension) 01/30/2014  . NSTEMI (non-ST elevated myocardial infarction) ([place]) 01/30/2014  . Heart murmur   . Weakness of left leg   . Prostate cancer [facility])     metastatic    PAST SURGICAL HISTORY :   Past Surgical History  Procedure Laterality Date  . Prostatectomy  1994  . Colon resection    . Eye surgery    . Cystoscopy w/  retrogrades Left 06/08/2015    Procedure: CYSTOSCOPY WITH RETROGRADE PYELOGRAM;  Surgeon: Nickie Retort, MD;  Location: [facility] ORS;  Service: Urology;  Laterality: Left;  . Cystoscopy w/ ureteral stent placement Left 10/05/2015    Procedure: CYSTOSCOPY WITH STENT REPLACEMENT;  Surgeon: Nickie Retort, MD;  Location: [facility] ORS;  Service: Urology;  Laterality: Left;  . Cardiac catheterization      [facility]  . Cataract extraction w/ intraocular lens implant Bilateral   . Cystoscopy w/ ureteral stent placement Left 01/04/2016    Procedure: CYSTOSCOPY WITH STENT REPLACEMENT;  Surgeon: Nickie Retort, MD;  Location: [facility] ORS;  Service: Urology;  Laterality: Left;    FAMILY HISTORY :   Family History  Problem Relation Age of Onset  . Aneurysm Father   . Cancer Mother     breast    SOCIAL HISTORY:   Social History  Substance Use Topics  . Smoking status: Former Smoker -- 1.50 packs/day for 25 years    Types: Cigarettes    Quit date: 01/21/1974  . Smokeless tobacco: Never Used  . Alcohol Use: No    ALLERGIES:  has No Known Allergies.  MEDICATIONS:  Current Outpatient Prescriptions  Medication Sig Dispense Refill  . allopurinol (ZYLOPRIM) 300 MG tablet Take 300 mg by mouth daily.    Marland Kitchen aspirin 81 MG tablet Take 81 mg by mouth daily. Reported on 01/03/2016    . atorvastatin (LIPITOR) 10 MG tablet  Take 10 mg by mouth every evening. Reported on 01/04/2016    . carvedilol (COREG) 6.25 MG tablet Take 1 tablet (6.25 mg total) by mouth 2 (two) times daily with a meal. 60 tablet 2  . Cyanocobalamin (VITAMIN B 12 PO) Take 1,000 mcg by mouth daily.    . ferrous sulfate 325 (65 FE) MG tablet Take 325 mg by mouth 2 (two) times daily with a meal.    . oxybutynin (DITROPAN) 5 MG tablet Take 5 mg by mouth 4 (four) times daily.    . pantoprazole (PROTONIX) 40 MG tablet Take 1 tablet (40 mg total) by mouth daily before breakfast. 30 tablet 2  . ranitidine (ZANTAC) 150 MG tablet Take 150 mg by mouth  at bedtime.     . verapamil (CALAN-SR) 240 MG CR tablet Take 1 tablet (240 mg total) by mouth daily. (Patient taking differently: Take 240 mg by mouth 2 (two) times daily. ) 30 tablet 2   No current facility-administered medications for this visit.   Facility-Administered Medications Ordered in Other Visits  Medication Dose Route Frequency Provider Last Rate Last Dose  . 0.9 %  sodium chloride infusion   Intravenous Continuous Wylene Simmer, MD        PHYSICAL EXAMINATION: ECOG PERFORMANCE STATUS: 0 - Asymptomatic  BP 152/81 mmHg  Pulse 69  Temp(Src) 96.5 F (35.8 C) (Tympanic)  Resp 18  Wt 198 lb 13.7 oz (90.2 kg)  Filed Weights   03/01/16 1433  Weight: 198 lb 13.7 oz (90.2 kg)    GENERAL: Well-nourished well-developed; Alert, no distress and comfortable.   Accompanied by his wife. Patient limps when walking. EYES: no pallor or icterus OROPHARYNX: no thrush or ulceration; dentures. NECK: supple, no masses felt LYMPH:  no palpable lymphadenopathy in the cervical, axillary or inguinal regions LUNGS: clear to auscultation and  No wheeze or crackles HEART/CVS: regular rate & rhythm and no murmurs; No lower extremity edema ABDOMEN:abdomen soft, non-tender and normal bowel sounds Musculoskeletal:no cyanosis of digits and no clubbing  PSYCH: alert & oriented x 3 with fluent speech NEURO: no focal motor/sensory deficits SKIN:  no rashes or significant lesions  LABORATORY DATA:  I have reviewed the data as listed    Component Value Date/Time   NA 137 03/01/2016 1338   K 4.5 03/01/2016 1338   CL 105 03/01/2016 1338   CO2 26 03/01/2016 1338   GLUCOSE 105* 03/01/2016 1338   BUN 23* 03/01/2016 1338   CREATININE 1.14 03/01/2016 1338   CALCIUM 10.1 03/01/2016 1338   PROT 7.6 03/01/2016 1338   ALBUMIN 4.1 03/01/2016 1338   AST 21 03/01/2016 1338   ALT 12* 03/01/2016 1338   ALKPHOS 70 03/01/2016 1338   BILITOT 0.4 03/01/2016 1338   GFRNONAA >60 03/01/2016 1338   GFRAA >60  03/01/2016 1338    No results found for: SPEP, UPEP  Lab Results  Component Value Date   WBC 10.0 03/01/2016   NEUTROABS 6.6* 03/01/2016   HGB 13.8 03/01/2016   HCT 41.4 03/01/2016   MCV 87.3 03/01/2016   PLT 190 03/01/2016      Chemistry      Component Value Date/Time   NA 137 03/01/2016 1338   K 4.5 03/01/2016 1338   CL 105 03/01/2016 1338   CO2 26 03/01/2016 1338   BUN 23* 03/01/2016 1338   CREATININE 1.14 03/01/2016 1338      Component Value Date/Time   CALCIUM 10.1 03/01/2016 1338   ALKPHOS 70 03/01/2016 1338  AST 21 03/01/2016 1338   ALT 12* 03/01/2016 1338   BILITOT 0.4 03/01/2016 1338        ASSESSMENT & PLAN:   Prostate cancer metastatic to bone [facility]) # Metastatic castrate sensitive prostate cancer- currently on Lupron/X-geva every 4 months. PSA from today pending.  ..   #  left hip/left knee pain- This is unlikely related to his prostate cancer. Question arthritis versus other causes. Refer to orthopedics. Recommend continued hydrocodone 1 every 6 hours.   # Left ureteral stent/hydronephrosis- likely from pelvic adenopathy from his malignancy. Plan stent exchange next month.   # patient will follow-up with me in approximately 4 months in Aug 2017.   # 25 minutes face-to-face with the patient discussing the above plan of care; more than 50% of time spent on prognosis/ natural history; counseling and coordination.    # Left foot infection recommend podiatry evaluation.     Cammie Sickle, MD 03/01/2016 8:07 PM

## 2016-03-06 ENCOUNTER — Other Ambulatory Visit: Payer: Self-pay | Admitting: *Deleted

## 2016-03-06 ENCOUNTER — Telehealth: Payer: Self-pay | Admitting: *Deleted

## 2016-03-06 DIAGNOSIS — C7951 Secondary malignant neoplasm of bone: Principal | ICD-10-CM

## 2016-03-06 DIAGNOSIS — C61 Malignant neoplasm of prostate: Secondary | ICD-10-CM

## 2016-03-06 NOTE — Telephone Encounter (Signed)
Patient had PSA drawn on 03-01-16.  The tube was sent to main lab for processing.  They in turn sent it to [facility] in [place].  PSA was never run and tube is now too old.  Next appointment not until November.  Lanelle Bal has names of those involved.  Will contact patient and have him come in for lab only.

## 2016-03-06 NOTE — Telephone Encounter (Signed)
Called patient to inform him that PSA lab needs to be rerun.  Patient agreed to comeback.  One of our schedulers will be contacting him with appointment.  Also notified Lanelle Bal in lab.

## 2016-03-07 ENCOUNTER — Inpatient Hospital Stay: Payer: Medicare Other

## 2016-03-07 ENCOUNTER — Other Ambulatory Visit: Payer: Self-pay

## 2016-03-07 DIAGNOSIS — C61 Malignant neoplasm of prostate: Secondary | ICD-10-CM | POA: Diagnosis not present

## 2016-03-07 DIAGNOSIS — C7951 Secondary malignant neoplasm of bone: Principal | ICD-10-CM

## 2016-03-07 LAB — PSA: PSA: 0.06 ng/mL (ref 0.00–4.00)

## 2016-03-08 ENCOUNTER — Telehealth: Payer: Self-pay | Admitting: *Deleted

## 2016-03-08 NOTE — Telephone Encounter (Signed)
-----   Message from Cammie Sickle, MD sent at 03/07/2016  8:34 PM EDT ----- Please inform pt- that psa improving. Dr.B

## 2016-03-09 NOTE — Telephone Encounter (Signed)
Patient informed of psa level-improved.  Teach back process performed with patient. He stated that he appreciated the call.

## 2016-03-16 ENCOUNTER — Telehealth: Payer: Self-pay | Admitting: Radiology

## 2016-03-16 NOTE — Telephone Encounter (Signed)
Notified pt of surgery with Dr Pilar Jarvis scheduled 04/11/16, pre-admit testing appt on 03/28/16 @10 :00 & to call day prior to surgery for arrival time to SDS. Pt voices understanding.

## 2016-03-16 NOTE — Telephone Encounter (Signed)
-----   Message from Nickie Retort, MD sent at 01/04/2016  8:02 AM EDT ----- Patient needs to be scheduled for cysto, left ureteral stent exchange in 3 months

## 2016-03-28 ENCOUNTER — Encounter
Admission: RE | Admit: 2016-03-28 | Discharge: 2016-03-28 | Disposition: A | Discharge status: Home / Self Care | Payer: Medicare Other | Source: Ambulatory Visit | Attending: Urology | Admitting: Urology

## 2016-03-28 DIAGNOSIS — Z01818 Encounter for other preprocedural examination: Secondary | ICD-10-CM | POA: Diagnosis not present

## 2016-03-28 NOTE — Patient Instructions (Addendum)
  Your procedure is scheduled on: 04/11/16 Wed Report to Same Day Surgery 2nd floor medical mall To find out your arrival time please call [phone] between 1PM - 3PM on 04/10/16 Tues Remember: Instructions that are not followed completely may result in serious medical risk, up to and including death, or upon the discretion of your surgeon and anesthesiologist your surgery may need to be rescheduled.    _x___ 1. Do not eat food or drink liquids after midnight. No gum chewing or hard candies.     __x__ 2. No Alcohol for 24 hours before or after surgery.   __x__3. No Smoking for 24 prior to surgery.   ____  4. Bring all medications with you on the day of surgery if instructed.    __x__ 5. Notify your doctor if there is any change in your medical condition     (cold, fever, infections).     Do not wear jewelry, make-up, hairpins, clips or nail polish.  Do not wear lotions, powders, or perfumes. You may wear deodorant.  Do not shave 48 hours prior to surgery. Men may shave face and neck.  Do not bring valuables to the hospital.    [facility] is not responsible for any belongings or valuables.               Contacts, dentures or bridgework may not be worn into surgery.  Leave your suitcase in the car. After surgery it may be brought to your room.  For patients admitted to the hospital, discharge time is determined by your treatment team.   Patients discharged the day of surgery will not be allowed to drive home.    Please read over the following fact sheets that you were given:   [facility] Preparing for Surgery and or MRSA Information   _x___ Take these medicines the morning of surgery with A SIP OF WATER:    1. carvedilol (COREG) 6.25 MG tablet  2.pantoprazole (PROTONIX) 40 MG tablet  3.verapamil (CALAN-SR) 240 MG CR tablet  4.  5.  6.  ____ Fleet Enema (as directed)   __ Use CHG Soap or sage wipes as directed on instruction sheet   ____ Use inhalers on the day of  surgery and bring to hospital day of surgery  ____ Stop metformin 2 days prior to surgery    ____ Take 1/2 of usual insulin dose the night before surgery and none on the morning of           surgery.   _x___ Stop aspirin or coumadin, or plavix Stop aspirin 1 week before surgery.  _x__ Stop Anti-inflammatories such as Advil, Aleve, Ibuprofen, Motrin, Naproxen,          Naprosyn, Goodies powders or aspirin products. Ok to take Tylenol.   ____ Stop supplements until after surgery.    ____ Bring C-Pap to the hospital.

## 2016-03-30 LAB — URINE CULTURE: Culture: >=100000 — AB

## 2016-04-02 ENCOUNTER — Telehealth: Payer: Self-pay

## 2016-04-02 DIAGNOSIS — N390 Urinary tract infection, site not specified: Secondary | ICD-10-CM

## 2016-04-02 MED ORDER — NITROFURANTOIN MONOHYD MACRO 100 MG PO CAPS: 100.0000 mg | ORAL_CAPSULE | Freq: Two times a day (BID) | ORAL | 0 refills | Status: AC

## 2016-04-02 NOTE — Telephone Encounter (Signed)
abx were sent prior to surgery per Dr. Pilar Jarvis

## 2016-04-02 NOTE — Telephone Encounter (Signed)
LMOM regarding +ucx with script sent to pharmacy. Need to notify pt of repeat ucx scheduled 04/06/16 @11 :30.

## 2016-04-02 NOTE — Telephone Encounter (Signed)
Notified pt of script sent to pharmacy for +ucx, to begin antibiotics today & RTC on 04/06/16 @11 :30 for repeat ucx. Pt voices understanding.

## 2016-04-05 ENCOUNTER — Other Ambulatory Visit: Payer: Self-pay

## 2016-04-05 DIAGNOSIS — N390 Urinary tract infection, site not specified: Secondary | ICD-10-CM

## 2016-04-06 ENCOUNTER — Other Ambulatory Visit: Payer: Medicare Other

## 2016-04-06 DIAGNOSIS — N390 Urinary tract infection, site not specified: Secondary | ICD-10-CM

## 2016-04-06 LAB — MICROSCOPIC EXAMINATION: RBC MICROSCOPIC, UA: NONE SEEN /HPF (ref 0–?)

## 2016-04-06 LAB — URINALYSIS, COMPLETE
Bilirubin, UA: NEGATIVE
Glucose, UA: NEGATIVE
Ketones, UA: NEGATIVE
LEUKOCYTES UA: NEGATIVE
NITRITE UA: NEGATIVE
PH UA: 6 (ref 5.0–7.5)
Protein, UA: NEGATIVE
RBC UA: NEGATIVE
Specific Gravity, UA: 1.015 (ref 1.005–1.030)
Urobilinogen, Ur: 0.2 mg/dL (ref 0.2–1.0)

## 2016-04-10 LAB — CULTURE, URINE COMPREHENSIVE

## 2016-04-11 ENCOUNTER — Encounter: Payer: Self-pay | Admitting: *Deleted

## 2016-04-11 ENCOUNTER — Ambulatory Visit
Admission: RE | Admit: 2016-04-11 | Discharge: 2016-04-11 | Disposition: A | Discharge status: Home / Self Care | Payer: Medicare Other | Source: Ambulatory Visit | Attending: Urology | Admitting: Urology

## 2016-04-11 ENCOUNTER — Encounter
Admission: RE | Disposition: A | Discharge status: Home / Self Care | Payer: Self-pay | Source: Ambulatory Visit | Attending: Urology

## 2016-04-11 ENCOUNTER — Ambulatory Visit: Payer: Medicare Other | Admitting: Anesthesiology

## 2016-04-11 DIAGNOSIS — C61 Malignant neoplasm of prostate: Secondary | ICD-10-CM | POA: Insufficient documentation

## 2016-04-11 DIAGNOSIS — Z8249 Family history of ischemic heart disease and other diseases of the circulatory system: Secondary | ICD-10-CM | POA: Insufficient documentation

## 2016-04-11 DIAGNOSIS — M1990 Unspecified osteoarthritis, unspecified site: Secondary | ICD-10-CM | POA: Insufficient documentation

## 2016-04-11 DIAGNOSIS — I350 Nonrheumatic aortic (valve) stenosis: Secondary | ICD-10-CM | POA: Insufficient documentation

## 2016-04-11 DIAGNOSIS — Z8546 Personal history of malignant neoplasm of prostate: Secondary | ICD-10-CM | POA: Diagnosis present

## 2016-04-11 DIAGNOSIS — Z809 Family history of malignant neoplasm, unspecified: Secondary | ICD-10-CM | POA: Diagnosis not present

## 2016-04-11 DIAGNOSIS — R011 Cardiac murmur, unspecified: Secondary | ICD-10-CM | POA: Insufficient documentation

## 2016-04-11 DIAGNOSIS — N1330 Unspecified hydronephrosis: Secondary | ICD-10-CM

## 2016-04-11 DIAGNOSIS — G8929 Other chronic pain: Secondary | ICD-10-CM | POA: Insufficient documentation

## 2016-04-11 DIAGNOSIS — C7951 Secondary malignant neoplasm of bone: Secondary | ICD-10-CM | POA: Diagnosis not present

## 2016-04-11 DIAGNOSIS — E119 Type 2 diabetes mellitus without complications: Secondary | ICD-10-CM | POA: Insufficient documentation

## 2016-04-11 DIAGNOSIS — I10 Essential (primary) hypertension: Secondary | ICD-10-CM | POA: Diagnosis not present

## 2016-04-11 DIAGNOSIS — E7800 Pure hypercholesterolemia, unspecified: Secondary | ICD-10-CM | POA: Insufficient documentation

## 2016-04-11 DIAGNOSIS — I4892 Unspecified atrial flutter: Secondary | ICD-10-CM | POA: Insufficient documentation

## 2016-04-11 DIAGNOSIS — K219 Gastro-esophageal reflux disease without esophagitis: Secondary | ICD-10-CM | POA: Insufficient documentation

## 2016-04-11 DIAGNOSIS — Z87891 Personal history of nicotine dependence: Secondary | ICD-10-CM | POA: Insufficient documentation

## 2016-04-11 DIAGNOSIS — I252 Old myocardial infarction: Secondary | ICD-10-CM | POA: Insufficient documentation

## 2016-04-11 HISTORY — PX: CYSTOSCOPY W/ URETERAL STENT PLACEMENT: SHX1429

## 2016-04-11 LAB — GLUCOSE, CAPILLARY
Glucose-Capillary: 153 mg/dL — ABNORMAL HIGH (ref 65–99)
Glucose-Capillary: 105 mg/dL — ABNORMAL HIGH (ref 65–99)

## 2016-04-11 SURGERY — CYSTOSCOPY, FLEXIBLE, WITH STENT REPLACEMENT
Anesthesia: General | Laterality: Left | Wound class: Clean Contaminated

## 2016-04-11 MED ORDER — CEPHALEXIN 500 MG PO CAPS
500.0000 mg | ORAL_CAPSULE | Freq: Three times a day (TID) | ORAL | 0 refills | Status: DC
Start: 2016-04-11 — End: 2016-06-28

## 2016-04-11 MED ORDER — LIDOCAINE HCL (CARDIAC) 20 MG/ML IV SOLN
INTRAVENOUS | Status: DC | PRN
  Administered 2016-04-11: 100 mg via INTRAVENOUS

## 2016-04-11 MED ORDER — SODIUM CHLORIDE 0.9 % IV SOLN
INTRAVENOUS | Status: DC
  Administered 2016-04-11: 12:00:00 via INTRAVENOUS

## 2016-04-11 MED ORDER — FENTANYL CITRATE (PF) 100 MCG/2ML IJ SOLN: 25.0000 ug | INTRAMUSCULAR | Status: DC | PRN

## 2016-04-11 MED ORDER — HYDROCODONE-ACETAMINOPHEN 5-325 MG PO TABS: 1.0000 | ORAL_TABLET | ORAL | 0 refills | Status: DC | PRN

## 2016-04-11 MED ORDER — FENTANYL CITRATE (PF) 100 MCG/2ML IJ SOLN
INTRAMUSCULAR | Status: DC | PRN
  Administered 2016-04-11: 50 ug via INTRAVENOUS

## 2016-04-11 MED ORDER — PROPOFOL 10 MG/ML IV BOLUS
INTRAVENOUS | Status: DC | PRN
  Administered 2016-04-11: 150 mg via INTRAVENOUS

## 2016-04-11 MED ORDER — CEFAZOLIN SODIUM-DEXTROSE 2-4 GM/100ML-% IV SOLN
INTRAVENOUS | Status: AC
  Filled 2016-04-11: qty 100

## 2016-04-11 MED ORDER — CEFAZOLIN SODIUM-DEXTROSE 2-4 GM/100ML-% IV SOLN
2.0000 g | Freq: Once | INTRAVENOUS | Status: AC
  Administered 2016-04-11: 2 g via INTRAVENOUS

## 2016-04-11 MED ORDER — ONDANSETRON HCL 4 MG/2ML IJ SOLN: 4.0000 mg | Freq: Once | INTRAMUSCULAR | Status: DC | PRN

## 2016-04-11 SURGICAL SUPPLY — 18 items
BACTOSHIELD CHG 4% 4OZ (MISCELLANEOUS) ×1
GLOVE BIO SURGEON STRL SZ7.5 (GLOVE) ×2 IMPLANT
GOWN STRL REUS W/ TWL LRG LVL4 (GOWN DISPOSABLE) ×1 IMPLANT
GOWN STRL REUS W/TWL LRG LVL4 (GOWN DISPOSABLE) ×1
GOWN STRL REUS W/TWL XL LVL3 (GOWN DISPOSABLE) ×2 IMPLANT
KIT RM TURNOVER CYSTO AR (KITS) ×2 IMPLANT
PACK CYSTO AR (MISCELLANEOUS) ×2 IMPLANT
SCRUB CHG 4% DYNA-HEX 4OZ (MISCELLANEOUS) ×1 IMPLANT
SENSORWIRE 0.038 NOT ANGLED (WIRE) ×2
SET CYSTO W/LG BORE CLAMP LF (SET/KITS/TRAYS/PACK) ×2 IMPLANT
SOL .9 NS 3000ML IRR  AL (IV SOLUTION) ×1
SOL .9 NS 3000ML IRR AL (IV SOLUTION) ×1
SOL .9 NS 3000ML IRR UROMATIC (IV SOLUTION) ×1 IMPLANT
STENT URET 6FRX24 CONTOUR (STENTS) IMPLANT
STENT URET 6FRX26 CONTOUR (STENTS) ×2 IMPLANT
SURGILUBE 2OZ TUBE FLIPTOP (MISCELLANEOUS) ×2 IMPLANT
WATER STERILE IRR 1000ML POUR (IV SOLUTION) ×2 IMPLANT
WIRE SENSOR 0.038 NOT ANGLED (WIRE) ×1 IMPLANT

## 2016-04-11 NOTE — Op Note (Signed)
Preoperative diagnosis:  1. Left hydronephrosis 2. Stage IV metastatic prostate cancer  Postoperative diagnosis:  1. Left hydronephrosis 2. Stage IV metastatic prostate cancer  Procedure: 1. Cystoscopy 2. Left ureteral stent exchange 6 [place] by 26 cm 3. Left retrograde pyelogram with interpertation  Surgeon: Baruch Gouty, MD  Anesthesia: General  Complications: None  Intraoperative findings: The stent was confirmed to be in the correct location on fluoroscopy.  EBL: None  Specimens: None  Drains: 6 French by 26 cm double-J left ureteral stent  Disposition: Stable to the postanesthesia care unit  Indication for procedure: The patient is a 75 y.o. male with stage IV metastatic prostate cancer with left hydroureteronephrosis presents today for left ureteral stent exchange. After reviewing the management options for treatment, the patient elected to proceed with the above surgical procedure(s). We have discussed the potential benefits and risks of the procedure, side effects of the proposed treatment, the likelihood of the patient achieving the goals of the procedure, and any potential problems that might occur during the procedure or recuperation. Informed consent has been obtained.  Description of procedure: The patient was met in the preoperative area. All risks, benefits, and indications of the procedure were described in great detail. The patient consented to the procedure. Preoperative antibiotics were given. The patient was taken to the operative theater. General anesthesia was induced per the anesthesia service. The patient was then placed in the dorsal lithotomy position and prepped and draped in the usual sterile fashion. A preoperative timeout was called. A 21 French 30 cystoscope was inserted into the patient's bladder per urethra atraumatically. The left ureteral stent was grasped flexible graspers brought to level urethral meatus. Prior to this though,  fluoroscopy was used to locate the level of the renal pelvis via stent curl. Sensor was exchanged left ureteral stent up to level the renal pelvis and the stent was removed.6 and opening catheter exchanged for the sensor wire and a retrograde pyelogram on the left side to place. This showed the patient still having persistent hydronephrosis. The open ended catheter was then exchanged for a sensor wire. The cystoscope was reassembled over the sensor wire inserted back into the patient's bladder. A 6 French by 26 cm double-J ureteral stent was then placed over the sensor wire and advanced to thelevel of the renal pelvis under fluoroscopy. The sensor wire was removed. A curl seen was seen in the patient's left renal pelvis indicating proximal correct placement on fluoroscopy. The correct distal placement of the stent was confirmed with direct position within the patient's urinary bladder. There was clear yellow urine draining from the stent further confirming correct placement. The patient's bladder was then drained. The patient was woke from anesthesia and transferred to postanesthesia care unit without apparent complication.  Plan: The patient will follow-up in 3 months for repeat stent exchange. His metastatic stage IV prostate cancer is managed via Lupron/Xgeva by medical oncology.  Baruch Gouty, M.D.

## 2016-04-11 NOTE — Anesthesia Procedure Notes (Signed)
Procedure Name: LMA Insertion Date/Time: 04/11/2016 12:50 PM Performed by: Nelda Marseille Pre-anesthesia Checklist: Patient identified, Patient being monitored, Timeout performed, Emergency Drugs available and Suction available Patient Re-evaluated:Patient Re-evaluated prior to inductionOxygen Delivery Method: Circle system utilized Preoxygenation: Pre-oxygenation with 100% oxygen Intubation Type: IV induction Ventilation: Mask ventilation without difficulty LMA: LMA inserted LMA Size: 4.5 Tube type: Oral Number of attempts: 1 Placement Confirmation: positive ETCO2 and breath sounds checked- equal and bilateral Tube secured with: Tape Dental Injury: Teeth and Oropharynx as per pre-operative assessment

## 2016-04-11 NOTE — Anesthesia Procedure Notes (Signed)
Procedure Name: LMA Insertion Date/Time: 04/11/2016 12:41 PM Performed by: Nelda Marseille Pre-anesthesia Checklist: Patient identified, Patient being monitored, Timeout performed, Emergency Drugs available and Suction available Patient Re-evaluated:Patient Re-evaluated prior to inductionOxygen Delivery Method: Circle system utilized Preoxygenation: Pre-oxygenation with 100% oxygen Intubation Type: IV induction Ventilation: Mask ventilation without difficulty LMA: LMA inserted LMA Size: 4.5 Tube type: Oral Number of attempts: 1 Placement Confirmation: positive ETCO2 and breath sounds checked- equal and bilateral Tube secured with: Tape Dental Injury: Teeth and Oropharynx as per pre-operative assessment

## 2016-04-11 NOTE — H&P (Signed)
[name] 09-Aug-1941 [id number]  Referring provider: Tamsen Roers, MD [address], [place] [address]      Chief Complaint  Patient presents with  . Pre-op Exam    Hydronephrosis with ureteral stricture    HPI: The patient is a 75 year old gentleman with a known past medical history of stage IV metastatic prostate cancer. He presents today with left hydroureteronephrosis. This was seen on both the recent CT scan and bone scan. He also has what appears to be a mass in his prostatics bed and possibly growing into his bladder. He had a radical prostatectomy in 1992. We do not have records from this procedure. The patient does not remember where this was done. He saw Dr. Elnoria Howard a few weeks ago and noted a large firm mass at the base the bladder. He now has what appears to be this newfound left hydronephrosis. His creatinine is 1.6. He sees Dr. Leia Alf recently started him on Lupron. His most recent PSA prior to Lupron was 48.31 month ago.  January 2017 Interval history: The patient underwent a antegrade left ureteral stent placement for his left hydronephrosis after unsuccessful attempt at placing a left ureteral stent. He presented today to discuss management of his stent as he is due for change. He did have a recent renal ultrasound for left flank pain that showed the stent in place draining well. It did show a possible stone on the right side 9 mm in side. However he did get a CT scan to 6 months ago which was showed 1-2 mm punctate stones so I feel that this is a shadow not a true stone. He also has chronic pain from his metastatic prostate cancer. He is on tramadol amount is out of pills. He states it does not work for him at this time.   PMH:      Past Medical History  Diagnosis Date  . Diabetes mellitus without complication ([facility])   . Hypertension   . Hypercholesteremia   . GERD (gastroesophageal reflux disease)   . Prostate cancer  ([place])     metastatic  . Arthritis   . Heart murmur   . Aortic stenosis, moderate 01/30/2014  . Atrial flutter ([facility]) 02/02/2014  . HTN (hypertension) 01/30/2014  . NSTEMI (non-ST elevated myocardial infarction) ([facility]) 01/30/2014    Surgical History:       Past Surgical History  Procedure Laterality Date  . Prostatectomy  1994  . Colon resection    . Eye surgery    . Cystoscopy w/ retrogrades Left 06/08/2015    Procedure: CYSTOSCOPY WITH RETROGRADE PYELOGRAM; Surgeon: Nickie Retort, MD; Location: [facility] ORS; Service: Urology; Laterality: Left;    Home Medications:           Medication List               This list is accurate as of: 09/16/15 3:21 PM. Always use your most recent med list.                     allopurinol 300 MG tablet  Commonly known as: ZYLOPRIM  Take 300 mg by mouth daily.     aspirin 81 MG chewable tablet  Chew 1 tablet (81 mg total) by mouth daily.     atorvastatin 10 MG tablet  Commonly known as: LIPITOR     carvedilol 6.25 MG tablet  Commonly known as: COREG  Take 1 tablet (6.25 mg total) by mouth  2 (two) times daily with a meal.     ferrous sulfate 325 (65 FE) MG tablet  Take 325 mg by mouth 2 (two) times daily with a meal.     lovastatin 20 MG tablet  Commonly known as: MEVACOR  Take 20 mg by mouth daily. Reported on 09/16/2015     oxybutynin 5 MG tablet  Commonly known as: DITROPAN  Take 5 mg by mouth 4 (four) times daily.     pantoprazole 40 MG tablet  Commonly known as: PROTONIX  Take 1 tablet (40 mg total) by mouth daily before breakfast.     ranitidine 150 MG tablet  Commonly known as: ZANTAC  Take 150 mg by mouth 2 (two) times daily.     traMADol 50 MG tablet  Commonly known as: ULTRAM  Take 1 tablet (50 mg total) by mouth 2 (two) times daily as needed.     verapamil 240 MG CR tablet  Commonly known  as: CALAN-SR  Take 1 tablet (240 mg total) by mouth daily.     VITAMIN B 12 PO  Take 1,000 mcg by mouth daily.           Allergies: No Known Allergies  Family History:      Family History  Problem Relation Age of Onset  . Aneurysm Father   . Cancer Mother     Social History:  reports that he quit smoking about 41 years ago. His smoking use included Cigarettes. He has a 37.5 pack-year smoking history. He has never used smokeless tobacco. He reports that he does not drink alcohol or use illicit drugs.  ROS: UROLOGY Frequent Urination?: No Hard to postpone urination?: No Burning/pain with urination?: No Get up at night to urinate?: Yes Leakage of urine?: Yes Urine stream starts and stops?: No Trouble starting stream?: No Do you have to strain to urinate?: No Blood in urine?: No Urinary tract infection?: No Sexually transmitted disease?: No Injury to kidneys or bladder?: No Painful intercourse?: No Weak stream?: No Erection problems?: No Penile pain?: No  Gastrointestinal Nausea?: No Vomiting?: No Indigestion/heartburn?: No Diarrhea?: No Constipation?: No  Constitutional Fever: No Night sweats?: No Weight loss?: No Fatigue?: No  Skin Skin rash/lesions?: No Itching?: No  Eyes Blurred vision?: No Double vision?: No  Ears/Nose/Throat Sore throat?: No Sinus problems?: No  Hematologic/Lymphatic Swollen glands?: No Easy bruising?: No  Cardiovascular Leg swelling?: No Chest pain?: No  Respiratory Cough?: No Shortness of breath?: No  Endocrine Excessive thirst?: Yes  Musculoskeletal Back pain?: Yes Joint pain?: No  Neurological Headaches?: No Dizziness?: No  Psychologic Depression?: No Anxiety?: No  Physical Exam: BP 124/66 mmHg  Pulse 69  Ht 5\' 11"  (1.803 m)  Wt 214 lb 14.4 oz (97.478 kg)  BMI 29.99 kg/m2  Constitutional: Alert and oriented, No acute distress. HEENT: [place] AT, moist mucus  membranes. Trachea midline, no masses. Cardiovascular: No clubbing, cyanosis, or edema. RRR Respiratory: Normal respiratory effort, no increased work of breathing. clear GI: Abdomen is soft, nontender, nondistended, no abdominal masses GU: No CVA tenderness. Skin: No rashes, bruises or suspicious lesions. Lymph: No cervical or inguinal adenopathy. Neurologic: Grossly intact, no focal deficits, moving all 4 extremities. Psychiatric: Normal mood and affect.  Laboratory Data:      Recent Labs          Lab Results  Component Value Date   WBC 10.1 08/01/2015   HGB 13.6 08/01/2015   HCT 41.4 08/01/2015   MCV 87.1 08/01/2015   PLT  183 08/01/2015           Recent Labs          Lab Results  Component Value Date   CREATININE 1.40* 08/01/2015           Recent Labs          Lab Results  Component Value Date   PSA 1.39 07/04/2015   PSA 48.22* 03/24/2015           Recent Labs          Lab Results  Component Value Date   TESTOSTERONE 228* 03/24/2015           Recent Labs          Lab Results  Component Value Date   HGBA1C 5.5 01/30/2014      Urinalysis      Labs (Brief)             Component Value Date/Time   COLORURINE YELLOW 01/13/2014 2146   APPEARANCEUR CLOUDY* 01/13/2014 2146   LABSPEC 1.017 01/13/2014 2146   PHURINE 5.0 01/13/2014 2146   GLUCOSEU Negative 05/10/2015 1335   HGBUR LARGE* 01/13/2014 2146   BILIRUBINUR Negative 05/10/2015 1335   BILIRUBINUR NEGATIVE 01/13/2014 2146   KETONESUR NEGATIVE 01/13/2014 2146   PROTEINUR 100* 01/13/2014 2146   UROBILINOGEN 0.2 01/13/2014 2146   NITRITE Negative 05/10/2015 1335   NITRITE NEGATIVE 01/13/2014 2146   LEUKOCYTESUR Negative 05/10/2015 1335   LEUKOCYTESUR NEGATIVE 01/13/2014 2146       Assessment & Plan:    1. Left  Hydroureteronephrosis The patient is due for a left ureteral stent exchange.   2. Metastatic prostate cancer - Stage IV with metastases to lumbar spine, pelvic lymphadenopathy  He is under the care of Dr. Rogue Bussing on Lupron and Delton See for bone mets. We will defer further management to medical oncology.    No Follow-up on file.  Nickie Retort, MD  [facility] [address] [place], [place] [address] [phone]

## 2016-04-11 NOTE — Anesthesia Preprocedure Evaluation (Signed)
Anesthesia Evaluation  Patient identified by MRN, date of birth, ID band Patient awake    Reviewed: Allergy & Precautions, NPO status , Patient's Chart, lab work & pertinent test results  Airway Mallampati: II       Dental  (+) Upper Dentures, Lower Dentures   Pulmonary former smoker,    breath sounds clear to auscultation       Cardiovascular Exercise Tolerance: Good hypertension, Pt. on medications and Pt. on home beta blockers  Rhythm:Regular     Neuro/Psych    GI/Hepatic Neg liver ROS, Bowel prep,GERD  Medicated,  Endo/Other  neg diabetes  Renal/GU      Musculoskeletal   Abdominal Normal abdominal exam  (+)   Peds  Hematology  (+) anemia ,   Anesthesia Other Findings   Reproductive/Obstetrics                             Anesthesia Physical Anesthesia Plan  ASA: III  Anesthesia Plan: General   Post-op Pain Management:    Induction: Intravenous  Airway Management Planned: LMA  Additional Equipment:   Intra-op Plan:   Post-operative Plan: Extubation in OR  Informed Consent: I have reviewed the patients History and Physical, chart, labs and discussed the procedure including the risks, benefits and alternatives for the proposed anesthesia with the patient or authorized representative who has indicated his/her understanding and acceptance.     Plan Discussed with: CRNA  Anesthesia Plan Comments:         Anesthesia Quick Evaluation

## 2016-04-11 NOTE — Anesthesia Postprocedure Evaluation (Signed)
Anesthesia Post Note  Patient: [name]  Procedure(s) Performed: Procedure(s) (LRB): CYSTOSCOPY WITH STENT REPLACEMENT (Left)  Patient location during evaluation: PACU Anesthesia Type: General Level of consciousness: awake Pain management: pain level controlled Vital Signs Assessment: post-procedure vital signs reviewed and stable Respiratory status: nonlabored ventilation Cardiovascular status: stable Anesthetic complications: no    Last Vitals:  Vitals:   04/11/16 1340 04/11/16 1354  BP: (!) 156/71 (!) 158/67  Pulse: 65 64  Resp: 16 14  Temp: 36.8 C     Last Pain:  Vitals:   04/11/16 1340  TempSrc:   PainSc: 0-No pain                 VAN STAVEREN,Manali Mcelmurry

## 2016-04-11 NOTE — Transfer of Care (Signed)
Immediate Anesthesia Transfer of Care Note  Patient: [name]  Procedure(s) Performed: Procedure(s): CYSTOSCOPY WITH STENT REPLACEMENT (Left)  Patient Location: PACU  Anesthesia Type:General  Level of Consciousness: sedated  Airway & Oxygen Therapy: Patient Spontanous Breathing and Patient connected to face mask oxygen  Post-op Assessment: Report given to RN and Post -op Vital signs reviewed and stable  Post vital signs: Reviewed and stable  Last Vitals:  Vitals:   04/11/16 1147  BP: (!) 180/94  Pulse: 68  Resp: 18  Temp: 36.8 C    Last Pain:  Vitals:   04/11/16 1147  TempSrc: Oral         Complications: No apparent anesthesia complications

## 2016-04-11 NOTE — Discharge Instructions (Signed)

## 2016-04-11 NOTE — OR Nursing (Signed)
No complaints of pain oln discharge

## 2016-04-17 ENCOUNTER — Encounter: Payer: Self-pay | Admitting: Family Medicine

## 2016-06-08 ENCOUNTER — Other Ambulatory Visit: Payer: Self-pay | Admitting: Radiology

## 2016-06-08 ENCOUNTER — Telehealth: Payer: Self-pay | Admitting: Radiology

## 2016-06-08 DIAGNOSIS — N200 Calculus of kidney: Secondary | ICD-10-CM

## 2016-06-08 DIAGNOSIS — R109 Unspecified abdominal pain: Secondary | ICD-10-CM

## 2016-06-08 NOTE — Telephone Encounter (Signed)
Pt called c/o intermittent sharp right flank pain that is relieved when he lies down. Pt will need a RUS per Dr Pilar Jarvis which has been ordered. Pt aware.

## 2016-06-20 ENCOUNTER — Other Ambulatory Visit: Payer: Self-pay | Admitting: Radiology

## 2016-06-20 DIAGNOSIS — N1330 Unspecified hydronephrosis: Secondary | ICD-10-CM

## 2016-06-20 NOTE — Telephone Encounter (Signed)
Spoke with pt about RUS ordered d/t pt c/o flank pain. Pt wants to defer RUS stating he "feels much better" & pain has resolved. Dr Pilar Jarvis is aware.

## 2016-06-21 ENCOUNTER — Other Ambulatory Visit: Payer: Self-pay | Admitting: Radiology

## 2016-06-21 ENCOUNTER — Telehealth: Payer: Self-pay | Admitting: Radiology

## 2016-06-21 NOTE — Telephone Encounter (Signed)
Notified pt of surgery scheduled with Dr Pilar Jarvis on 07/18/16, pre-admit testing appt on 07/04/16 @10 :15 & to call day prior to surgery for arrival time to SDS. Advised pt to continue ASA 81mg  per Dr Pilar Jarvis. Pt voices understanding.

## 2016-06-21 NOTE — Telephone Encounter (Signed)
-----   Message from Nickie Retort, MD sent at 04/11/2016 12:51 PM EDT ----- Patient needs repeat left stent exchange in OR in 3 months. thanks

## 2016-06-28 ENCOUNTER — Inpatient Hospital Stay: Payer: Medicare Other

## 2016-06-28 ENCOUNTER — Inpatient Hospital Stay: Payer: Medicare Other | Attending: Internal Medicine

## 2016-06-28 ENCOUNTER — Inpatient Hospital Stay (HOSPITAL_BASED_OUTPATIENT_CLINIC_OR_DEPARTMENT_OTHER): Payer: Medicare Other | Admitting: Internal Medicine

## 2016-06-28 VITALS — BP 139/78 | HR 67 | Temp 96.2°F | Resp 18 | Wt 206.0 lb

## 2016-06-28 DIAGNOSIS — Z79818 Long term (current) use of other agents affecting estrogen receptors and estrogen levels: Secondary | ICD-10-CM

## 2016-06-28 DIAGNOSIS — E119 Type 2 diabetes mellitus without complications: Secondary | ICD-10-CM

## 2016-06-28 DIAGNOSIS — N1330 Unspecified hydronephrosis: Secondary | ICD-10-CM

## 2016-06-28 DIAGNOSIS — R011 Cardiac murmur, unspecified: Secondary | ICD-10-CM

## 2016-06-28 DIAGNOSIS — I10 Essential (primary) hypertension: Secondary | ICD-10-CM | POA: Insufficient documentation

## 2016-06-28 DIAGNOSIS — I252 Old myocardial infarction: Secondary | ICD-10-CM | POA: Insufficient documentation

## 2016-06-28 DIAGNOSIS — R531 Weakness: Secondary | ICD-10-CM | POA: Insufficient documentation

## 2016-06-28 DIAGNOSIS — I350 Nonrheumatic aortic (valve) stenosis: Secondary | ICD-10-CM | POA: Insufficient documentation

## 2016-06-28 DIAGNOSIS — C61 Malignant neoplasm of prostate: Secondary | ICD-10-CM

## 2016-06-28 DIAGNOSIS — Z87891 Personal history of nicotine dependence: Secondary | ICD-10-CM | POA: Diagnosis not present

## 2016-06-28 DIAGNOSIS — I4892 Unspecified atrial flutter: Secondary | ICD-10-CM

## 2016-06-28 DIAGNOSIS — C7951 Secondary malignant neoplasm of bone: Principal | ICD-10-CM

## 2016-06-28 DIAGNOSIS — K219 Gastro-esophageal reflux disease without esophagitis: Secondary | ICD-10-CM | POA: Diagnosis not present

## 2016-06-28 DIAGNOSIS — M129 Arthropathy, unspecified: Secondary | ICD-10-CM

## 2016-06-28 DIAGNOSIS — Z79899 Other long term (current) drug therapy: Secondary | ICD-10-CM | POA: Diagnosis not present

## 2016-06-28 DIAGNOSIS — Z7982 Long term (current) use of aspirin: Secondary | ICD-10-CM

## 2016-06-28 DIAGNOSIS — E7800 Pure hypercholesterolemia, unspecified: Secondary | ICD-10-CM | POA: Diagnosis not present

## 2016-06-28 LAB — COMPREHENSIVE METABOLIC PANEL
ALBUMIN: 4.2 g/dL (ref 3.5–5.0)
ALT: 14 U/L — ABNORMAL LOW (ref 17–63)
AST: 20 U/L (ref 15–41)
Alkaline Phosphatase: 60 U/L (ref 38–126)
Anion gap: 6 (ref 5–15)
BILIRUBIN TOTAL: 0.3 mg/dL (ref 0.3–1.2)
BUN: 26 mg/dL — AB (ref 6–20)
CHLORIDE: 106 mmol/L (ref 101–111)
CO2: 24 mmol/L (ref 22–32)
Calcium: 10.3 mg/dL (ref 8.9–10.3)
Creatinine, Ser: 1.34 mg/dL — ABNORMAL HIGH (ref 0.61–1.24)
GFR calc Af Amer: 59 mL/min — ABNORMAL LOW (ref >60)
GFR calc non Af Amer: 51 mL/min — ABNORMAL LOW (ref >60)
GLUCOSE: 132 mg/dL — AB (ref 65–99)
POTASSIUM: 4.3 mmol/L (ref 3.5–5.1)
SODIUM: 136 mmol/L (ref 135–145)
TOTAL PROTEIN: 7.6 g/dL (ref 6.5–8.1)

## 2016-06-28 LAB — PSA: PSA: 0.02 ng/mL (ref 0.00–4.00)

## 2016-06-28 LAB — CBC WITH DIFFERENTIAL/PLATELET
Basophils Absolute: 0.2 10*3/uL — ABNORMAL HIGH (ref 0–0.1)
Basophils Relative: 2 %
EOS PCT: 5 %
Eosinophils Absolute: 0.5 10*3/uL (ref 0–0.7)
HEMATOCRIT: 40.4 % (ref 40.0–52.0)
Hemoglobin: 13.6 g/dL (ref 13.0–18.0)
LYMPHS ABS: 1.9 10*3/uL (ref 1.0–3.6)
LYMPHS PCT: 21 %
MCH: 29.7 pg (ref 26.0–34.0)
MCHC: 33.6 g/dL (ref 32.0–36.0)
MCV: 88.4 fL (ref 80.0–100.0)
MONO ABS: 0.8 10*3/uL (ref 0.2–1.0)
MONOS PCT: 9 %
NEUTROS ABS: 5.8 10*3/uL (ref 1.4–6.5)
Neutrophils Relative %: 63 %
PLATELETS: 183 10*3/uL (ref 150–440)
RBC: 4.57 MIL/uL (ref 4.40–5.90)
RDW: 15.9 % — AB (ref 11.5–14.5)
WBC: 9.2 10*3/uL (ref 3.8–10.6)

## 2016-06-28 MED ORDER — DENOSUMAB 120 MG/1.7ML ~~LOC~~ SOLN
120.0000 mg | Freq: Once | SUBCUTANEOUS | Status: AC
  Administered 2016-06-28: 120 mg via SUBCUTANEOUS
  Filled 2016-06-28: qty 1.7

## 2016-06-28 MED ORDER — LEUPROLIDE ACETATE (4 MONTH) 30 MG IM KIT
30.0000 mg | PACK | Freq: Once | INTRAMUSCULAR | Status: AC
  Administered 2016-06-28: 30 mg via INTRAMUSCULAR
  Filled 2016-06-28: qty 30

## 2016-06-28 NOTE — Progress Notes (Signed)
Patient is here for follow up,  

## 2016-06-28 NOTE — Assessment & Plan Note (Addendum)
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 Last psa July 2017- 0.02.  PSA from today pending. No clinical progression noted  # Left ureteral stent/hydronephrosis- likely from pelvic adenopathy from his malignancy. Plan stent exchange next month.   # Follow-up in 4 months; Lupron Xgeva; labs few days prior.

## 2016-06-28 NOTE — Progress Notes (Signed)
[facility] OFFICE PROGRESS NOTE  Patient Care Team: Tamsen Roers, MD as PCP - General (Family Medicine)   SUMMARY OF ONCOLOGIC HISTORY:  Oncology History   # AUG 2016- METASTATIC PROSTATE CA/ STAGE IV; Castrate sensitive [PSA- 48] ; mets- lumbar spine [s/p pal RT to Aug 2016; Dr.Crystal] /Pelvic LN; 1994- Prostate CA s/p Surgery [facility]]; Lupron q4 M; MARCH 2017- Bone scan- L4/Right pubic rami uptake; PSA- 0.1/testosterone-castrate [<3]  # Bone lesions on denosumab;DEC 2016- Recm q 57M  # Left hydronephrosis s/p stenting     Prostate cancer metastatic to bone ([place])   04/11/2015 Initial Diagnosis    Prostate cancer metastatic to bone [facility])       INTERVAL HISTORY:  A very pleasant 75 year old male patient with above history of prostate cancer stage IV hormone sensitive currently on Lupron every 4 months is here for follow-up.  His hip pain has resolved. His appetite is good. He is not losing any weight. No hot flashes. Denies any worsening back pain. No chest pain or shortness of breath or cough.   REVIEW OF SYSTEMS:  A complete 10 point review of system is done which is negative except mentioned above/history of present illness.   PAST MEDICAL HISTORY :  Past Medical History:  Diagnosis Date  . Aortic stenosis, moderate 01/30/2014  . Arthritis   . Atrial flutter ([place]) 02/02/2014  . Diabetes mellitus without complication ([facility])    diet control  . GERD (gastroesophageal reflux disease)   . Heart murmur   . Heart murmur   . HTN (hypertension) 01/30/2014  . Hypercholesteremia   . Hypertension   . NSTEMI (non-ST elevated myocardial infarction) ([place]) 01/30/2014   denies  . Prostate cancer ([place])    metastatic  . Weakness of left leg     PAST SURGICAL HISTORY :   Past Surgical History:  Procedure Laterality Date  . CARDIAC CATHETERIZATION     [facility]  . CATARACT EXTRACTION W/ INTRAOCULAR LENS IMPLANT Bilateral   . COLON RESECTION    . CYSTOSCOPY W/  RETROGRADES Left 06/08/2015   Procedure: CYSTOSCOPY WITH RETROGRADE PYELOGRAM;  Surgeon: Nickie Retort, MD;  Location: [facility] ORS;  Service: Urology;  Laterality: Left;  . CYSTOSCOPY W/ URETERAL STENT PLACEMENT Left 10/05/2015   Procedure: CYSTOSCOPY WITH STENT REPLACEMENT;  Surgeon: Nickie Retort, MD;  Location: [facility] ORS;  Service: Urology;  Laterality: Left;  . CYSTOSCOPY W/ URETERAL STENT PLACEMENT Left 01/04/2016   Procedure: CYSTOSCOPY WITH STENT REPLACEMENT;  Surgeon: Nickie Retort, MD;  Location: [facility] ORS;  Service: Urology;  Laterality: Left;  . CYSTOSCOPY W/ URETERAL STENT PLACEMENT Left 04/11/2016   Procedure: CYSTOSCOPY WITH STENT REPLACEMENT;  Surgeon: Nickie Retort, MD;  Location: [facility] ORS;  Service: Urology;  Laterality: Left;  . EYE SURGERY    . HERNIA REPAIR    . PROSTATECTOMY  1994    FAMILY HISTORY :   Family History  Problem Relation Age of Onset  . Aneurysm Father   . Cancer Mother     breast    SOCIAL HISTORY:   Social History  Substance Use Topics  . Smoking status: Former Smoker    Packs/day: 1.50    Years: 25.00    Types: Cigarettes    Quit date: 01/21/1974  . Smokeless tobacco: Never Used  . Alcohol use No    ALLERGIES:  has No Known Allergies.  MEDICATIONS:  Current Outpatient Prescriptions  Medication Sig Dispense Refill  . allopurinol (ZYLOPRIM) 300 MG  tablet Take 300 mg by mouth daily.    Marland Kitchen aspirin 81 MG tablet Take 81 mg by mouth daily. Reported on 01/03/2016    . atorvastatin (LIPITOR) 10 MG tablet Take 10 mg by mouth every evening. Reported on 01/04/2016    . carvedilol (COREG) 6.25 MG tablet Take 1 tablet (6.25 mg total) by mouth 2 (two) times daily with a meal. 60 tablet 2  . Cyanocobalamin (VITAMIN B 12 PO) Take 1,000 mcg by mouth daily.    . ferrous sulfate 325 (65 FE) MG tablet Take 325 mg by mouth 2 (two) times daily with a meal.    . HYDROcodone-acetaminophen (NORCO) 5-325 MG tablet Take 1 tablet by mouth every 4 (four)  hours as needed for moderate pain. 10 tablet 0  . oxybutynin (DITROPAN) 5 MG tablet Take 5 mg by mouth 4 (four) times daily.    . pantoprazole (PROTONIX) 40 MG tablet Take 1 tablet (40 mg total) by mouth daily before breakfast. 30 tablet 2  . ranitidine (ZANTAC) 150 MG tablet Take 150 mg by mouth at bedtime.     . verapamil (CALAN-SR) 240 MG CR tablet Take 1 tablet (240 mg total) by mouth daily. 30 tablet 2   No current facility-administered medications for this visit.    Facility-Administered Medications Ordered in Other Visits  Medication Dose Route Frequency Provider Last Rate Last Dose  . 0.9 %  sodium chloride infusion   Intravenous Continuous Wylene Simmer, MD        PHYSICAL EXAMINATION: ECOG PERFORMANCE STATUS: 0 - Asymptomatic  BP 139/78 (BP Location: Right Arm, Patient Position: Sitting)   Pulse 67   Temp (!) 96.2 F (35.7 C) (Tympanic)   Resp 18   Wt 206 lb (93.4 kg)   BMI 28.73 kg/m   Filed Weights   06/28/16 1422  Weight: 206 lb (93.4 kg)    GENERAL: Well-nourished well-developed; Alert, no distress and comfortable.   Accompanied by his wife. Patient limps when walking. EYES: no pallor or icterus OROPHARYNX: no thrush or ulceration; dentures. NECK: supple, no masses felt LYMPH:  no palpable lymphadenopathy in the cervical, axillary or inguinal regions LUNGS: clear to auscultation and  No wheeze or crackles HEART/CVS: regular rate & rhythm and no murmurs; No lower extremity edema ABDOMEN:abdomen soft, non-tender and normal bowel sounds Musculoskeletal:no cyanosis of digits and no clubbing  PSYCH: alert & oriented x 3 with fluent speech NEURO: no focal motor/sensory deficits SKIN:  no rashes or significant lesions  LABORATORY DATA:  I have reviewed the data as listed    Component Value Date/Time   NA 136 06/28/2016 1340   K 4.3 06/28/2016 1340   CL 106 06/28/2016 1340   CO2 24 06/28/2016 1340   GLUCOSE 132 (H) 06/28/2016 1340   BUN 26 (H) 06/28/2016 1340    CREATININE 1.34 (H) 06/28/2016 1340   CALCIUM 10.3 06/28/2016 1340   PROT 7.6 06/28/2016 1340   ALBUMIN 4.2 06/28/2016 1340   AST 20 06/28/2016 1340   ALT 14 (L) 06/28/2016 1340   ALKPHOS 60 06/28/2016 1340   BILITOT 0.3 06/28/2016 1340   GFRNONAA 51 (L) 06/28/2016 1340   GFRAA 59 (L) 06/28/2016 1340    No results found for: SPEP, UPEP  Lab Results  Component Value Date   WBC 9.2 06/28/2016   NEUTROABS 5.8 06/28/2016   HGB 13.6 06/28/2016   HCT 40.4 06/28/2016   MCV 88.4 06/28/2016   PLT 183 06/28/2016      Chemistry  Component Value Date/Time   NA 136 06/28/2016 1340   K 4.3 06/28/2016 1340   CL 106 06/28/2016 1340   CO2 24 06/28/2016 1340   BUN 26 (H) 06/28/2016 1340   CREATININE 1.34 (H) 06/28/2016 1340      Component Value Date/Time   CALCIUM 10.3 06/28/2016 1340   ALKPHOS 60 06/28/2016 1340   AST 20 06/28/2016 1340   ALT 14 (L) 06/28/2016 1340   BILITOT 0.3 06/28/2016 1340      Results for NATIVIDAD, HAMADE (MRN [id number]) as of 06/28/2016 14:41  Ref. 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 Last psa July 2017- 0.02.  PSA from today pending. No clinical progression noted  # Left ureteral stent/hydronephrosis- likely from pelvic adenopathy from his malignancy. Plan stent exchange next month.   # Follow-up in 4 months; Lupron Xgeva; labs few days prior.      Cammie Sickle, MD 06/28/2016 5:02 PM

## 2016-07-04 ENCOUNTER — Encounter
Admission: RE | Admit: 2016-07-04 | Discharge: 2016-07-04 | Disposition: A | Discharge status: Home / Self Care | Payer: Medicare Other | Source: Ambulatory Visit | Attending: Urology | Admitting: Urology

## 2016-07-04 DIAGNOSIS — Z01818 Encounter for other preprocedural examination: Secondary | ICD-10-CM | POA: Diagnosis present

## 2016-07-04 DIAGNOSIS — E119 Type 2 diabetes mellitus without complications: Secondary | ICD-10-CM | POA: Diagnosis not present

## 2016-07-04 DIAGNOSIS — N1330 Unspecified hydronephrosis: Secondary | ICD-10-CM | POA: Insufficient documentation

## 2016-07-04 DIAGNOSIS — I10 Essential (primary) hypertension: Secondary | ICD-10-CM | POA: Diagnosis not present

## 2016-07-04 DIAGNOSIS — Z01812 Encounter for preprocedural laboratory examination: Secondary | ICD-10-CM | POA: Diagnosis not present

## 2016-07-04 DIAGNOSIS — R9431 Abnormal electrocardiogram [ECG] [EKG]: Secondary | ICD-10-CM | POA: Insufficient documentation

## 2016-07-04 NOTE — Pre-Procedure Instructions (Addendum)
Reviewed notes from Dr. Vincente Poli office, instructed pt not to stop aspirin.  Comparing EKG from 01/03/16 to today's EKG no change noted.  Spoke with Amy at Dr. Carlynn Purl office, no H+P seen in Epic.  Dr. Pilar Jarvis will do H+P am of surgery.

## 2016-07-04 NOTE — Patient Instructions (Addendum)
  Your procedure is scheduled on:Wednesday Nov. 29 , 2017. Report to Same Day Surgery. To find out your arrival time please call [phone] between 1PM - 3PM on Tuesday Nov. 28, 2017.  Remember: Instructions that are not followed completely may result in serious medical risk, up to and including death, or upon the discretion of your surgeon and anesthesiologist your surgery may need to be rescheduled.    _x___ 1. Do not eat food or drink liquids after midnight. No gum chewing or hard candies.     ____ 2. No Alcohol for 24 hours before or after surgery.   ____ 3. Bring all medications with you on the day of surgery if instructed.    __x__ 4. Notify your doctor if there is any change in your medical condition     (cold, fever, infections).    _____ 5. No smoking 24 hours prior to surgery.     Do not wear jewelry, make-up, hairpins, clips or nail polish.  Do not wear lotions, powders, or perfumes.   Do not shave 48 hours prior to surgery. Men may shave face and neck.  Do not bring valuables to the hospital.    [facility] is not responsible for any belongings or valuables.               Contacts, dentures or bridgework may not be worn into surgery.  Leave your suitcase in the car. After surgery it may be brought to your room.  For patients admitted to the hospital, discharge time is determined by your treatment team.   Patients discharged the day of surgery will not be allowed to drive home.    Please read over the following fact sheets that you were given:   [facility] Preparing for Surgery  _x___ Take these medicines the morning of surgery with A SIP OF WATER:    1. carvedilol (COREG)  2. pantoprazole (PROTONIX)  3. verapamil (CALAN-SR)   ____ Fleet Enema (as directed)   ____ Use CHG Soap as directed on instruction sheet  ____ Use inhalers on the day of surgery and bring to hospital day of surgery  ____ Stop metformin 2 days prior to surgery    ____ Take 1/2 of  usual insulin dose the night before surgery and none on the morning of surgery.   ____ Stop Plavix, coumadin or aspirin does not apply.  _x___ Stop Anti-inflammatories such as Advil, Aleve, Ibuprofen, Motrin, Naproxen, Naprosyn, Goodies powders or aspirin products. OK to take Tylenol.   _x___ Stop supplements Cyanocobalamin (VITAMIN B 12 PO) 7 days prior to surgery.    ____ Bring C-Pap to the hospital.

## 2016-07-05 LAB — URINE CULTURE

## 2016-07-06 NOTE — Pre-Procedure Instructions (Signed)
Urine culture results multiple species present, suggest recollection, sent to Dr. Carlynn Purl.  Received call from Amy at Dr. Carlynn Purl office, he does want the urine culture recollected.   Spoke with pt's wife on the phone.  She reports pt is having back pain.  Pt plans to call Amy at Dr. Carlynn Purl office for an appt.    Encouraged wife to let Amy know they would like to have the urine culture done at Dr. Carlynn Purl office at the appt. he plans to set up for his back pain instead of coming to [facility] for repeat urine culture.

## 2016-07-06 NOTE — Pre-Procedure Instructions (Signed)
Urine culture results sent to Dr. Pilar Jarvis for review.  Asked if wanted it recollected as suggested?

## 2016-07-09 ENCOUNTER — Other Ambulatory Visit: Payer: Medicare Other

## 2016-07-09 ENCOUNTER — Other Ambulatory Visit: Payer: Self-pay

## 2016-07-09 DIAGNOSIS — N1330 Unspecified hydronephrosis: Secondary | ICD-10-CM

## 2016-07-11 LAB — CULTURE, URINE COMPREHENSIVE

## 2016-07-17 MED ORDER — CEFAZOLIN SODIUM-DEXTROSE 2-4 GM/100ML-% IV SOLN
2.0000 g | INTRAVENOUS | Status: AC
  Administered 2016-07-18: 2 g via INTRAVENOUS

## 2016-07-18 ENCOUNTER — Ambulatory Visit
Admission: RE | Admit: 2016-07-18 | Discharge: 2016-07-18 | Disposition: A | Discharge status: Home / Self Care | Payer: Medicare Other | Source: Ambulatory Visit | Attending: Urology | Admitting: Urology

## 2016-07-18 ENCOUNTER — Ambulatory Visit: Payer: Medicare Other | Admitting: Certified Registered Nurse Anesthetist

## 2016-07-18 ENCOUNTER — Encounter: Payer: Self-pay | Admitting: *Deleted

## 2016-07-18 ENCOUNTER — Encounter
Admission: RE | Disposition: A | Discharge status: Home / Self Care | Payer: Self-pay | Source: Ambulatory Visit | Attending: Urology

## 2016-07-18 DIAGNOSIS — K219 Gastro-esophageal reflux disease without esophagitis: Secondary | ICD-10-CM | POA: Diagnosis not present

## 2016-07-18 DIAGNOSIS — I10 Essential (primary) hypertension: Secondary | ICD-10-CM | POA: Diagnosis not present

## 2016-07-18 DIAGNOSIS — I4892 Unspecified atrial flutter: Secondary | ICD-10-CM | POA: Diagnosis not present

## 2016-07-18 DIAGNOSIS — E119 Type 2 diabetes mellitus without complications: Secondary | ICD-10-CM | POA: Diagnosis not present

## 2016-07-18 DIAGNOSIS — Z79899 Other long term (current) drug therapy: Secondary | ICD-10-CM | POA: Insufficient documentation

## 2016-07-18 DIAGNOSIS — N1330 Unspecified hydronephrosis: Secondary | ICD-10-CM

## 2016-07-18 DIAGNOSIS — C61 Malignant neoplasm of prostate: Secondary | ICD-10-CM | POA: Diagnosis not present

## 2016-07-18 DIAGNOSIS — M1990 Unspecified osteoarthritis, unspecified site: Secondary | ICD-10-CM | POA: Insufficient documentation

## 2016-07-18 DIAGNOSIS — D649 Anemia, unspecified: Secondary | ICD-10-CM | POA: Diagnosis not present

## 2016-07-18 DIAGNOSIS — Z87891 Personal history of nicotine dependence: Secondary | ICD-10-CM | POA: Diagnosis not present

## 2016-07-18 DIAGNOSIS — E7800 Pure hypercholesterolemia, unspecified: Secondary | ICD-10-CM | POA: Diagnosis not present

## 2016-07-18 DIAGNOSIS — I350 Nonrheumatic aortic (valve) stenosis: Secondary | ICD-10-CM | POA: Diagnosis not present

## 2016-07-18 DIAGNOSIS — I252 Old myocardial infarction: Secondary | ICD-10-CM | POA: Diagnosis not present

## 2016-07-18 DIAGNOSIS — Z7982 Long term (current) use of aspirin: Secondary | ICD-10-CM | POA: Diagnosis not present

## 2016-07-18 HISTORY — PX: CYSTOSCOPY W/ URETERAL STENT PLACEMENT: SHX1429

## 2016-07-18 LAB — GLUCOSE, CAPILLARY
Glucose-Capillary: 149 mg/dL — ABNORMAL HIGH (ref 65–99)
Glucose-Capillary: 128 mg/dL — ABNORMAL HIGH (ref 65–99)

## 2016-07-18 SURGERY — CYSTOSCOPY, FLEXIBLE, WITH STENT REPLACEMENT
Anesthesia: General | Laterality: Left

## 2016-07-18 MED ORDER — LIDOCAINE HCL (CARDIAC) 20 MG/ML IV SOLN
INTRAVENOUS | Status: DC | PRN
  Administered 2016-07-18: 30 mg via INTRAVENOUS

## 2016-07-18 MED ORDER — DEXAMETHASONE SODIUM PHOSPHATE 10 MG/ML IJ SOLN
INTRAMUSCULAR | Status: DC | PRN
  Administered 2016-07-18: 4 mg via INTRAVENOUS

## 2016-07-18 MED ORDER — FENTANYL CITRATE (PF) 100 MCG/2ML IJ SOLN: 25.0000 ug | INTRAMUSCULAR | Status: DC | PRN

## 2016-07-18 MED ORDER — ONDANSETRON HCL 4 MG/2ML IJ SOLN: 4.0000 mg | Freq: Once | INTRAMUSCULAR | Status: DC | PRN

## 2016-07-18 MED ORDER — CEPHALEXIN 500 MG PO CAPS: 500.0000 mg | ORAL_CAPSULE | Freq: Three times a day (TID) | ORAL | 0 refills | Status: DC

## 2016-07-18 MED ORDER — SODIUM CHLORIDE 0.9 % IV SOLN
INTRAVENOUS | Status: DC
  Administered 2016-07-18: 08:00:00 via INTRAVENOUS

## 2016-07-18 MED ORDER — PHENYLEPHRINE HCL 10 MG/ML IJ SOLN
INTRAMUSCULAR | Status: DC | PRN
  Administered 2016-07-18 (×2): 100 ug via INTRAVENOUS

## 2016-07-18 MED ORDER — HYDROCODONE-ACETAMINOPHEN 5-325 MG PO TABS: 1.0000 | ORAL_TABLET | ORAL | 0 refills | Status: DC | PRN

## 2016-07-18 MED ORDER — EPHEDRINE SULFATE 50 MG/ML IJ SOLN
INTRAMUSCULAR | Status: DC | PRN
  Administered 2016-07-18 (×2): 10 mg via INTRAVENOUS

## 2016-07-18 MED ORDER — GLYCOPYRROLATE 0.2 MG/ML IJ SOLN
INTRAMUSCULAR | Status: DC | PRN
  Administered 2016-07-18: 0.2 mg via INTRAVENOUS

## 2016-07-18 MED ORDER — CEFAZOLIN SODIUM-DEXTROSE 2-4 GM/100ML-% IV SOLN
INTRAVENOUS | Status: AC
  Filled 2016-07-18: qty 100

## 2016-07-18 MED ORDER — PROPOFOL 10 MG/ML IV BOLUS
INTRAVENOUS | Status: DC | PRN
  Administered 2016-07-18: 120 mg via INTRAVENOUS

## 2016-07-18 MED ORDER — ONDANSETRON HCL 4 MG/2ML IJ SOLN
INTRAMUSCULAR | Status: DC | PRN
  Administered 2016-07-18: 4 mg via INTRAVENOUS

## 2016-07-18 SURGICAL SUPPLY — 18 items
BACTOSHIELD CHG 4% 4OZ (MISCELLANEOUS) ×2
GLOVE BIO SURGEON STRL SZ7.5 (GLOVE) ×9 IMPLANT
GOWN STRL REUS W/ TWL LRG LVL4 (GOWN DISPOSABLE) ×1 IMPLANT
GOWN STRL REUS W/TWL LRG LVL4 (GOWN DISPOSABLE) ×2
GOWN STRL REUS W/TWL XL LVL3 (GOWN DISPOSABLE) ×3 IMPLANT
KIT RM TURNOVER CYSTO AR (KITS) ×3 IMPLANT
PACK CYSTO AR (MISCELLANEOUS) ×3 IMPLANT
SCRUB CHG 4% DYNA-HEX 4OZ (MISCELLANEOUS) ×1 IMPLANT
SENSORWIRE 0.038 NOT ANGLED (WIRE) ×3
SET CYSTO W/LG BORE CLAMP LF (SET/KITS/TRAYS/PACK) ×3 IMPLANT
SOL .9 NS 3000ML IRR  AL (IV SOLUTION) ×2
SOL .9 NS 3000ML IRR AL (IV SOLUTION) ×1
SOL .9 NS 3000ML IRR UROMATIC (IV SOLUTION) ×1 IMPLANT
STENT URET 6FRX24 CONTOUR (STENTS) IMPLANT
STENT URET 6FRX26 CONTOUR (STENTS) ×3 IMPLANT
SURGILUBE 2OZ TUBE FLIPTOP (MISCELLANEOUS) ×3 IMPLANT
WATER STERILE IRR 1000ML POUR (IV SOLUTION) ×3 IMPLANT
WIRE SENSOR 0.038 NOT ANGLED (WIRE) ×1 IMPLANT

## 2016-07-18 NOTE — Anesthesia Procedure Notes (Signed)
Procedure Name: LMA Insertion Performed by: Darlyne Russian Pre-anesthesia Checklist: Patient identified, Patient being monitored, Timeout performed, Emergency Drugs available and Suction available Patient Re-evaluated:Patient Re-evaluated prior to inductionOxygen Delivery Method: Circle system utilized Preoxygenation: Pre-oxygenation with 100% oxygen Intubation Type: IV induction Ventilation: Mask ventilation without difficulty LMA: LMA inserted LMA Size: 4.0 Tube type: Oral Number of attempts: 1 Placement Confirmation: positive ETCO2 and breath sounds checked- equal and bilateral Tube secured with: Tape Dental Injury: Teeth and Oropharynx as per pre-operative assessment

## 2016-07-18 NOTE — Transfer of Care (Signed)
Immediate Anesthesia Transfer of Care Note  Patient: [name]  Procedure(s) Performed: Procedure(s): CYSTOSCOPY WITH STENT REPLACEMENT (Left)  Patient Location: PACU  Anesthesia Type:General  Level of Consciousness: awake, alert , oriented and patient cooperative  Airway & Oxygen Therapy: Patient Spontanous Breathing and Patient connected to nasal cannula oxygen  Post-op Assessment: Report given to RN and Post -op Vital signs reviewed and stable  Post vital signs: Reviewed and stable  Last Vitals:  Vitals:   07/18/16 0756 07/18/16 0937  BP: 135/77 104/66  Pulse: 74 70  Resp: 16 18  Temp: (!) 35.9 C 36.2 C    Last Pain:  Vitals:   07/18/16 0937  TempSrc:   PainSc: 0-No pain         Complications: No apparent anesthesia complications

## 2016-07-18 NOTE — Anesthesia Postprocedure Evaluation (Signed)
Anesthesia Post Note  Patient: [name]  Procedure(s) Performed: Procedure(s) (LRB): CYSTOSCOPY WITH STENT REPLACEMENT (Left)  Patient location during evaluation: PACU Anesthesia Type: General Level of consciousness: awake Pain management: pain level controlled Vital Signs Assessment: post-procedure vital signs reviewed and stable Respiratory status: nonlabored ventilation Cardiovascular status: stable Anesthetic complications: no    Last Vitals:  Vitals:   07/18/16 1032 07/18/16 1112  BP: 119/75 121/63  Pulse: 67 66  Resp: 20 20  Temp: 36.2 C     Last Pain:  Vitals:   07/18/16 1112  TempSrc:   PainSc: 0-No pain                 VAN STAVEREN,Tanmay Halteman

## 2016-07-18 NOTE — Anesthesia Preprocedure Evaluation (Signed)
Anesthesia Evaluation  Patient identified by MRN, date of birth, ID band Patient awake    Reviewed: Allergy & Precautions, NPO status , Patient's Chart, lab work & pertinent test results  Airway Mallampati: II       Dental  (+) Upper Dentures, Lower Dentures   Pulmonary former smoker,    breath sounds clear to auscultation       Cardiovascular Exercise Tolerance: Good hypertension, Pt. on medications + Past MI   Rhythm:Regular Rate:Normal     Neuro/Psych    GI/Hepatic Neg liver ROS, GERD  Medicated,  Endo/Other  diabetes, Type 2  Renal/GU      Musculoskeletal   Abdominal Normal abdominal exam  (+)   Peds  Hematology  (+) anemia ,   Anesthesia Other Findings   Reproductive/Obstetrics                             Anesthesia Physical Anesthesia Plan  ASA: III  Anesthesia Plan: General   Post-op Pain Management:    Induction: Intravenous  Airway Management Planned: LMA  Additional Equipment:   Intra-op Plan:   Post-operative Plan: Extubation in OR  Informed Consent: I have reviewed the patients History and Physical, chart, labs and discussed the procedure including the risks, benefits and alternatives for the proposed anesthesia with the patient or authorized representative who has indicated his/her understanding and acceptance.     Plan Discussed with: CRNA  Anesthesia Plan Comments:         Anesthesia Quick Evaluation

## 2016-07-18 NOTE — Discharge Instructions (Signed)
Hydronephrosis Introduction Hydronephrosis is the enlargement of a kidney due to a blockage that stops urine from flowing out of the body. What are the causes? Common causes of this condition include:  A birth (congenital) defect of the kidney.  A congenital defect of the tube through which urine travels (ureter).  Kidney stones.  An enlarged prostate gland.  A tumor.  Cancer of the prostate, bladder, uterus, ovary, or colon.  A blood clot. What are the signs or symptoms? Symptoms of this condition include:  Pain or discomfort in your side (flank).  Swelling of the abdomen.  Pain in the abdomen.  Nausea and vomiting.  Fever.  Pain while passing urine.  Feeling of urgency to urinate.  Frequent urination.  Infection of the urinary tract. In some cases, there are no symptoms. How is this diagnosed? This condition may be diagnosed with:  A medical history.  A physical exam.  Blood and urine tests to check kidney function.  Imaging tests, such as an X-ray, ultrasound, CT scan, or MRI.  A test in which a rigid or flexible telescope (cystoscope) is used to view the site of the blockage. How is this treated? Treatment for this condition depends on where the blockage is located, how long it has been there, and what caused it. The goal of treatment is to remove the blockage. Treatment options include:  A procedure to put in a soft tube to help drain urine.  Antibiotic medicines to treat or prevent infection.  Shock-wave therapy (lithotripsy) to help eliminate kidney stones. Follow these instructions at home:  Get lots of rest.  Drink enough fluid to keep your urine clear or pale yellow.  If you have a drain in, follow your health care provider's instructions about how to care for it.  Take medicines only as directed by your health care provider.  If you were prescribed an antibiotic medicine, finish all of it even if you start to feel better.  Keep  all follow-up visits as directed by your health care provider. This is important. Contact a health care provider if:  You continue to have symptoms after treatment.  You develop new symptoms.  You have a problem with a drainage device.  Your urine becomes cloudy or bloody.  You have a fever. Get help right away if:  You have severe flank or abdominal pain.  You develop vomiting and are unable to keep fluids down. This information is not intended to replace advice given to you by your health care provider. Make sure you discuss any questions you have with your health care provider. Document Released: 06/03/2007 Document Revised: 01/12/2016 Document Reviewed: 08/02/2014  2017 [employer] AMBULATORY SURGERY  DISCHARGE INSTRUCTIONS   1) The drugs that you were given will stay in your system until tomorrow so for the next 24 hours you should not:  A) Drive an automobile B) Make any legal decisions C) Drink any alcoholic beverage   2) You may resume regular meals tomorrow.  Today it is better to start with liquids and gradually work up to solid foods.  You may eat anything you prefer, but it is better to start with liquids, then soup and crackers, and gradually work up to solid foods.   3) Please notify your doctor immediately if you have any unusual bleeding, trouble breathing, redness and pain at the surgery site, drainage, fever, or pain not relieved by medication.    4) Additional Instructions:        Please contact  your physician with any problems or Same Day Surgery at [phone], Monday through Friday 6 am to 4 pm, or [place] at [facility] number at [phone].

## 2016-07-18 NOTE — H&P (Signed)
[name] [birth year] [id number]  Referring provider: Tamsen Roers, MD [address], [place] [address]      Chief Complaint  Patient presents with  . Pre-op Exam    Hydronephrosis with ureteral stricture    HPI: The patient is a 75 year old gentleman with a known past medical history of stage IV metastatic prostate cancer. He presents today with left hydroureteronephrosis. This was seen on both the recent CT scan and bone scan. He also has what appears to be a mass in his prostatics bed and possibly growing into his bladder. He had a radical prostatectomy in 1992. We do not have records from this procedure. The patient does not remember where this was done. He saw Dr. Elnoria Howard a few weeks ago and noted a large firm mass at the base the bladder. He now has what appears to be this newfound left hydronephrosis. His creatinine is 1.6. He sees Dr. Leia Alf recently started him on Lupron. His most recent PSA prior to Lupron was 48.31 month ago.  January 2017 Interval history: The patient underwent a antegrade left ureteral stent placement for his left hydronephrosis after unsuccessful attempt at placing a left ureteral stent. He presented today to discuss management of his stent as he is due for change. He did have a recent renal ultrasound for left flank pain that showed the stent in place draining well. It did show a possible stone on the right side 9 mm in side. However he did get a CT scan to 6 months ago which was showed 1-2 mm punctate stones so I feel that this is a shadow not a true stone. He also has chronic pain from his metastatic prostate cancer. He is on tramadol amount is out of pills. He states it does not work for him at this time.   PMH:      Past Medical History  Diagnosis Date  . Diabetes mellitus without complication ([place])   . Hypertension   . Hypercholesteremia   . GERD (gastroesophageal reflux disease)   . Prostate  cancer ([place])     metastatic  . Arthritis   . Heart murmur   . Aortic stenosis, moderate 01/30/2014  . Atrial flutter ([place]) 02/02/2014  . HTN (hypertension) 01/30/2014  . NSTEMI (non-ST elevated myocardial infarction) ([place]) 01/30/2014    Surgical History:       Past Surgical History  Procedure Laterality Date  . Prostatectomy  1994  . Colon resection    . Eye surgery    . Cystoscopy w/ retrogrades Left 06/08/2015    Procedure: CYSTOSCOPY WITH RETROGRADE PYELOGRAM; Surgeon: Nickie Retort, MD; Location: [facility] ORS; Service: Urology; Laterality: Left;    Home Medications:           Medication List               This list is accurate as of:09/16/15 3:21 PM.Always use your most recent med list.                     allopurinol 300 MG tablet  Commonly known as: ZYLOPRIM  Take 300 mg by mouth daily.     aspirin 81 MG chewable tablet  Chew 1 tablet (81 mg total) by mouth daily.     atorvastatin 10 MG tablet  Commonly known as: LIPITOR     carvedilol 6.25 MG tablet  Commonly known as: COREG  Take 1 tablet (6.25 mg total) by mouth 2 (two)  times daily with a meal.     ferrous sulfate 325 (65 FE) MG tablet  Take 325 mg by mouth 2 (two) times daily with a meal.     lovastatin 20 MG tablet  Commonly known as: MEVACOR  Take 20 mg by mouth daily. Reported on 09/16/2015     oxybutynin 5 MG tablet  Commonly known as: DITROPAN  Take 5 mg by mouth 4 (four) times daily.     pantoprazole 40 MG tablet  Commonly known as: PROTONIX  Take 1 tablet (40 mg total) by mouth daily before breakfast.     ranitidine 150 MG tablet  Commonly known as: ZANTAC  Take 150 mg by mouth 2 (two) times daily.     traMADol 50 MG tablet  Commonly known as: ULTRAM  Take 1 tablet (50 mg total) by mouth 2 (two) times daily as needed.     verapamil  240 MG CR tablet  Commonly known as: CALAN-SR  Take 1 tablet (240 mg total) by mouth daily.     VITAMIN B 12 PO  Take 1,000 mcg by mouth daily.           Allergies: No Known Allergies  Family History:      Family History  Problem Relation Age of Onset  . Aneurysm Father   . Cancer Mother     Social History:reports that he quit smoking about 41 years ago. His smoking use included Cigarettes. He has a 37.5 pack-year smoking history. He has never used smokeless tobacco. He reports that he does not drink alcohol or use illicit drugs.  ROS: UROLOGY Frequent Urination?: No Hard to postpone urination?: No Burning/pain with urination?: No Get up at night to urinate?: Yes Leakage of urine?: Yes Urine stream starts and stops?: No Trouble starting stream?: No Do you have to strain to urinate?: No Blood in urine?: No Urinary tract infection?: No Sexually transmitted disease?: No Injury to kidneys or bladder?: No Painful intercourse?: No Weak stream?: No Erection problems?: No Penile pain?: No  Gastrointestinal Nausea?: No Vomiting?: No Indigestion/heartburn?: No Diarrhea?: No Constipation?: No  Constitutional Fever: No Night sweats?: No Weight loss?: No Fatigue?: No  Skin Skin rash/lesions?: No Itching?: No  Eyes Blurred vision?: No Double vision?: No  Ears/Nose/Throat Sore throat?: No Sinus problems?: No  Hematologic/Lymphatic Swollen glands?: No Easy bruising?: No  Cardiovascular Leg swelling?: No Chest pain?: No  Respiratory Cough?: No Shortness of breath?: No  Endocrine Excessive thirst?: Yes  Musculoskeletal Back pain?: Yes Joint pain?: No  Neurological Headaches?: No Dizziness?: No  Psychologic Depression?: No Anxiety?: No  Physical Exam: BP 124/66 mmHg  Pulse 69  Ht 5\' 11"  (1.803 m)  Wt 214 lb 14.4 oz (97.478 kg)  BMI 29.99 kg/m2  Constitutional: Alert and oriented, No  acute distress. HEENT: [place] AT, moist mucus membranes. Trachea midline, no masses. Cardiovascular: No clubbing, cyanosis, or edema. RRR Respiratory: Normal respiratory effort, no increased work of breathing. clear GI: Abdomen is soft, nontender, nondistended, no abdominal masses GU: No CVA tenderness. Skin: No rashes, bruises or suspicious lesions. Lymph: No cervical or inguinal adenopathy. Neurologic: Grossly intact, no focal deficits, moving all 4 extremities. Psychiatric: Normal mood and affect.  Laboratory Data:      Recent Labs          Lab Results  Component Value Date   WBC 10.1 08/01/2015   HGB 13.6 08/01/2015   HCT 41.4 08/01/2015   MCV 87.1 08/01/2015   PLT 183 08/01/2015  Recent Labs          Lab Results  Component Value Date   CREATININE 1.40* 08/01/2015           Recent Labs          Lab Results  Component Value Date   PSA 1.39 07/04/2015   PSA 48.22* 03/24/2015           Recent Labs          Lab Results  Component Value Date   TESTOSTERONE 228* 03/24/2015           Recent Labs          Lab Results  Component Value Date   HGBA1C 5.5 01/30/2014      Urinalysis      Labs (Brief)            Component Value Date/Time   COLORURINE YELLOW 01/13/2014 2146   APPEARANCEUR CLOUDY* 01/13/2014 2146   LABSPEC 1.017 01/13/2014 2146   PHURINE 5.0 01/13/2014 2146   GLUCOSEU Negative 05/10/2015 1335   HGBUR LARGE* 01/13/2014 2146   BILIRUBINUR Negative 05/10/2015 1335   BILIRUBINUR NEGATIVE 01/13/2014 2146   KETONESUR NEGATIVE 01/13/2014 2146   PROTEINUR 100* 01/13/2014 2146   UROBILINOGEN 0.2 01/13/2014 2146   NITRITE Negative 05/10/2015 1335   NITRITE NEGATIVE 01/13/2014 2146   LEUKOCYTESUR Negative 05/10/2015 1335   LEUKOCYTESUR  NEGATIVE 01/13/2014 2146       Assessment & Plan:    1. Left Hydroureteronephrosis The patient is due for a left ureteral stent exchange.   2. Metastatic prostate cancer - Stage IV with metastases to lumbar spine, pelvic lymphadenopathy  He is under the care of Dr. Rogue Bussing on Lupron and Delton See for bone mets. We will defer further management to medical oncology.

## 2016-07-18 NOTE — Op Note (Signed)
Preoperative diagnosis:  1. Left hydronephrosis 2. Stage IV metastatic prostate cancer  Postoperative diagnosis:  1. Left hydronephrosis 2. Stage IV metastatic prostate cancer  Procedure: 1. Cystoscopy 2. Left ureteral stent exchange 6 [place] by 26 cm 3. Left retrograde pyelogram with interpertation  Surgeon: Baruch Gouty, MD  Anesthesia: General  Complications: None  Intraoperative findings: The stent was confirmed to be in the correct location on fluoroscopy.  EBL: None  Specimens: None  Drains: 6 French by 26 cm double-J left ureteral stent  Disposition: Stable to the postanesthesia care unit  Indication for procedure: The patient is a 75 y.o. male with stage IV metastatic prostate cancer with left hydroureteronephrosis presents today for left ureteral stent exchange. After reviewing the management options for treatment, the patient elected to proceed with the above surgical procedure(s). We have discussed the potential benefits and risks of the procedure, side effects of the proposed treatment, the likelihood of the patient achieving the goals of the procedure, and any potential problems that might occur during the procedure or recuperation. Informed consent has been obtained.  Description of procedure: The patient was met in the preoperative area. All risks, benefits, and indications of the procedure were described in great detail. The patient consented to the procedure. Preoperative antibiotics were given. The patient was taken to the operative theater. General anesthesia was induced per the anesthesia service. The patient was then placed in the dorsal lithotomy position and prepped and draped in the usual sterile fashion. A preoperative timeout was called. A 21 French 30 cystoscope was inserted into the patient's bladder per urethra atraumatically. The left ureteral stent was grasped flexible graspers brought to level urethral meatus. Prior to this though,  fluoroscopy was used to locate the level of the renal pelvis via stent curl. Sensor was exchanged left ureteral stent up to level the renal pelvis and the stent was removed.6 and opening catheter exchanged for the sensor wire and a retrograde pyelogram on the left side to place. This showed the patient still having persistent hydronephrosis. The open ended catheter was then exchanged for a sensor wire. The cystoscope was reassembled over the sensor wire inserted back into the patient's bladder. A 6 French by 26 cm double-J ureteral stent was then placed over the sensor wire and advanced to thelevel of the renal pelvis under fluoroscopy. The sensor wire was removed. A curl seen was seen in the patient's left renal pelvis indicating proximal correct placement on fluoroscopy. The correct distal placement of the stent was confirmed with direct position within the patient's urinary bladder. There was clear yellow urine draining from the stent further confirming correct placement. The patient's bladder was then drained. The patient was woke from anesthesia and transferred to postanesthesia care unit without apparent complication.  Plan: The patient will follow-up in 3 months for repeat stent exchange. His metastatic stage IV prostate cancer is managed via Lupron/Xgeva by medical oncology.

## 2016-07-19 ENCOUNTER — Encounter: Payer: Self-pay | Admitting: Urology

## 2016-07-19 ENCOUNTER — Ambulatory Visit: Payer: Medicare Other

## 2016-09-04 ENCOUNTER — Telehealth: Payer: Self-pay | Admitting: Radiology

## 2016-09-04 ENCOUNTER — Other Ambulatory Visit: Payer: Self-pay | Admitting: Radiology

## 2016-09-04 DIAGNOSIS — N1339 Other hydronephrosis: Secondary | ICD-10-CM

## 2016-09-04 DIAGNOSIS — C61 Malignant neoplasm of prostate: Secondary | ICD-10-CM

## 2016-09-04 NOTE — Telephone Encounter (Signed)
Notified pt of surgery scheduled with Dr Pilar Jarvis on 11/02/16, pre-admit testing appt on 10/23/16 @1 :00 & to call day prior to surgery for arrival time to SDS. Advised pt to continue ASA 81mg  per Dr Pilar Jarvis. Pt voices understanding.

## 2016-09-04 NOTE — Telephone Encounter (Signed)
-----   Message from Nickie Retort, MD sent at 07/18/2016  9:33 AM EST ----- Patient needs cysto, left stent exchange in 3 months. thanks

## 2016-10-19 ENCOUNTER — Inpatient Hospital Stay: Payer: Medicare PPO | Attending: Internal Medicine

## 2016-10-19 DIAGNOSIS — I129 Hypertensive chronic kidney disease with stage 1 through stage 4 chronic kidney disease, or unspecified chronic kidney disease: Secondary | ICD-10-CM | POA: Insufficient documentation

## 2016-10-19 DIAGNOSIS — E119 Type 2 diabetes mellitus without complications: Secondary | ICD-10-CM | POA: Diagnosis not present

## 2016-10-19 DIAGNOSIS — I350 Nonrheumatic aortic (valve) stenosis: Secondary | ICD-10-CM | POA: Insufficient documentation

## 2016-10-19 DIAGNOSIS — Z79818 Long term (current) use of other agents affecting estrogen receptors and estrogen levels: Secondary | ICD-10-CM | POA: Insufficient documentation

## 2016-10-19 DIAGNOSIS — N1330 Unspecified hydronephrosis: Secondary | ICD-10-CM | POA: Diagnosis not present

## 2016-10-19 DIAGNOSIS — C61 Malignant neoplasm of prostate: Secondary | ICD-10-CM | POA: Insufficient documentation

## 2016-10-19 DIAGNOSIS — K219 Gastro-esophageal reflux disease without esophagitis: Secondary | ICD-10-CM | POA: Diagnosis not present

## 2016-10-19 DIAGNOSIS — M1990 Unspecified osteoarthritis, unspecified site: Secondary | ICD-10-CM | POA: Diagnosis not present

## 2016-10-19 DIAGNOSIS — Z87891 Personal history of nicotine dependence: Secondary | ICD-10-CM | POA: Diagnosis not present

## 2016-10-19 DIAGNOSIS — Z79899 Other long term (current) drug therapy: Secondary | ICD-10-CM | POA: Diagnosis not present

## 2016-10-19 DIAGNOSIS — N184 Chronic kidney disease, stage 4 (severe): Secondary | ICD-10-CM | POA: Insufficient documentation

## 2016-10-19 DIAGNOSIS — I4892 Unspecified atrial flutter: Secondary | ICD-10-CM | POA: Diagnosis not present

## 2016-10-19 DIAGNOSIS — E7800 Pure hypercholesterolemia, unspecified: Secondary | ICD-10-CM | POA: Diagnosis not present

## 2016-10-19 DIAGNOSIS — I10 Essential (primary) hypertension: Secondary | ICD-10-CM | POA: Diagnosis not present

## 2016-10-19 DIAGNOSIS — C7951 Secondary malignant neoplasm of bone: Secondary | ICD-10-CM | POA: Insufficient documentation

## 2016-10-19 DIAGNOSIS — Z7982 Long term (current) use of aspirin: Secondary | ICD-10-CM | POA: Diagnosis not present

## 2016-10-19 LAB — COMPREHENSIVE METABOLIC PANEL
ALBUMIN: 4.1 g/dL (ref 3.5–5.0)
ALT: 16 U/L — ABNORMAL LOW (ref 17–63)
ANION GAP: 8 (ref 5–15)
AST: 24 U/L (ref 15–41)
Alkaline Phosphatase: 57 U/L (ref 38–126)
BUN: 30 mg/dL — ABNORMAL HIGH (ref 6–20)
CHLORIDE: 105 mmol/L (ref 101–111)
CO2: 24 mmol/L (ref 22–32)
Calcium: 10 mg/dL (ref 8.9–10.3)
Creatinine, Ser: 1.47 mg/dL — ABNORMAL HIGH (ref 0.61–1.24)
GFR calc Af Amer: 52 mL/min — ABNORMAL LOW (ref >60)
GFR calc non Af Amer: 45 mL/min — ABNORMAL LOW (ref >60)
GLUCOSE: 124 mg/dL — AB (ref 65–99)
POTASSIUM: 4.5 mmol/L (ref 3.5–5.1)
SODIUM: 137 mmol/L (ref 135–145)
TOTAL PROTEIN: 7.4 g/dL (ref 6.5–8.1)
Total Bilirubin: 0.3 mg/dL (ref 0.3–1.2)

## 2016-10-19 LAB — CBC WITH DIFFERENTIAL/PLATELET
BASOS PCT: 0 %
Basophils Absolute: 0 10*3/uL (ref 0–0.1)
Eosinophils Absolute: 0.5 10*3/uL (ref 0–0.7)
Eosinophils Relative: 6 %
HCT: 40 % (ref 40.0–52.0)
Hemoglobin: 13.6 g/dL (ref 13.0–18.0)
Lymphocytes Relative: 21 %
Lymphs Abs: 1.9 10*3/uL (ref 1.0–3.6)
MCH: 30 pg (ref 26.0–34.0)
MCHC: 34 g/dL (ref 32.0–36.0)
MCV: 88.3 fL (ref 80.0–100.0)
MONOS PCT: 9 %
Monocytes Absolute: 0.8 10*3/uL (ref 0.2–1.0)
NEUTROS PCT: 64 %
Neutro Abs: 5.7 10*3/uL (ref 1.4–6.5)
Platelets: 181 10*3/uL (ref 150–440)
RBC: 4.54 MIL/uL (ref 4.40–5.90)
RDW: 15.8 % — AB (ref 11.5–14.5)
WBC: 8.9 10*3/uL (ref 3.8–10.6)

## 2016-10-19 LAB — PSA: PSA: 0.1 ng/mL (ref 0.00–4.00)

## 2016-10-23 ENCOUNTER — Encounter
Admission: RE | Admit: 2016-10-23 | Discharge: 2016-10-23 | Disposition: A | Discharge status: Home / Self Care | Payer: Medicare PPO | Source: Ambulatory Visit | Attending: Urology | Admitting: Urology

## 2016-10-23 DIAGNOSIS — Z01812 Encounter for preprocedural laboratory examination: Secondary | ICD-10-CM | POA: Insufficient documentation

## 2016-10-23 DIAGNOSIS — N1330 Unspecified hydronephrosis: Secondary | ICD-10-CM | POA: Insufficient documentation

## 2016-10-23 DIAGNOSIS — C61 Malignant neoplasm of prostate: Secondary | ICD-10-CM | POA: Insufficient documentation

## 2016-10-23 NOTE — Patient Instructions (Signed)
  Your procedure is scheduled [id number] March 16 , 2018. Report to Same Day Surgery. To find out your arrival time please call [phone] between 1PM - 3PM on Thursday November 01, 2016.  Remember: Instructions that are not followed completely may result in serious medical risk, up to and including death, or upon the discretion of your surgeon and anesthesiologist your surgery may need to be rescheduled.    _x___ 1. Do not eat food or drink liquids after midnight. No gum chewing or hard candies.     ____ 2. No Alcohol for 24 hours before or after surgery.   ____ 3. Bring all medications with you on the day of surgery if instructed.    __x__ 4. Notify your doctor if there is any change in your medical condition     (cold, fever, infections).    _____ 5. No smoking 24 hours prior to surgery.     Do not wear jewelry, make-up, hairpins, clips or nail polish.  Do not wear lotions, powders, or perfumes.   Do not shave 48 hours prior to surgery. Men may shave face and neck.  Do not bring valuables to the hospital.    [facility] is not responsible for any belongings or valuables.               Contacts, dentures or bridgework may not be worn into surgery.  Leave your suitcase in the car. After surgery it may be brought to your room.  For patients admitted to the hospital, discharge time is determined by your treatment team.   Patients discharged the day of surgery will not be allowed to drive home.    Please read over the following fact sheets that you were given:   [facility] Preparing for Surgery  _x___ Take these medicines the morning of surgery with A SIP OF WATER:    1. atorvastatin (LIPITOR)  2. carvedilol (COREG)  3. pantoprazole (PROTONIX)  4. verapamil (CALAN-SR)    ____ Fleet Enema (as directed)   ____ Use CHG Soap as directed on instruction sheet  ____ Use inhalers on the day of surgery and bring to hospital day of surgery  ____ Stop metformin 2 days prior to  surgery    ____ Take 1/2 of usual insulin dose the night before surgery and none on the morning of surgery.   __x__ Stop aspirin as directed by surgeon.  __x__ Stop Anti-inflammatories such as Advil, Aleve, Ibuprofen, Motrin, Naproxen, Naprosyn, Goodies powders or aspirin products. OK to take Tylenol.   ____ Stop supplements until after surgery.    ____ Bring C-Pap to the hospital.

## 2016-10-24 LAB — URINE CULTURE: CULTURE: NO GROWTH

## 2016-10-26 ENCOUNTER — Inpatient Hospital Stay (HOSPITAL_BASED_OUTPATIENT_CLINIC_OR_DEPARTMENT_OTHER): Payer: Medicare PPO | Admitting: Internal Medicine

## 2016-10-26 ENCOUNTER — Inpatient Hospital Stay: Payer: Medicare PPO

## 2016-10-26 VITALS — BP 140/84 | HR 63 | Temp 97.6°F | Resp 18 | Ht 71.0 in | Wt 200.0 lb

## 2016-10-26 DIAGNOSIS — E7800 Pure hypercholesterolemia, unspecified: Secondary | ICD-10-CM | POA: Diagnosis not present

## 2016-10-26 DIAGNOSIS — C61 Malignant neoplasm of prostate: Secondary | ICD-10-CM

## 2016-10-26 DIAGNOSIS — I129 Hypertensive chronic kidney disease with stage 1 through stage 4 chronic kidney disease, or unspecified chronic kidney disease: Secondary | ICD-10-CM | POA: Diagnosis not present

## 2016-10-26 DIAGNOSIS — I4892 Unspecified atrial flutter: Secondary | ICD-10-CM | POA: Diagnosis not present

## 2016-10-26 DIAGNOSIS — Z87891 Personal history of nicotine dependence: Secondary | ICD-10-CM

## 2016-10-26 DIAGNOSIS — Z79899 Other long term (current) drug therapy: Secondary | ICD-10-CM

## 2016-10-26 DIAGNOSIS — C7951 Secondary malignant neoplasm of bone: Principal | ICD-10-CM

## 2016-10-26 DIAGNOSIS — M1990 Unspecified osteoarthritis, unspecified site: Secondary | ICD-10-CM

## 2016-10-26 DIAGNOSIS — K219 Gastro-esophageal reflux disease without esophagitis: Secondary | ICD-10-CM

## 2016-10-26 DIAGNOSIS — N1330 Unspecified hydronephrosis: Secondary | ICD-10-CM

## 2016-10-26 DIAGNOSIS — N184 Chronic kidney disease, stage 4 (severe): Secondary | ICD-10-CM

## 2016-10-26 DIAGNOSIS — I10 Essential (primary) hypertension: Secondary | ICD-10-CM | POA: Diagnosis not present

## 2016-10-26 DIAGNOSIS — I350 Nonrheumatic aortic (valve) stenosis: Secondary | ICD-10-CM | POA: Diagnosis not present

## 2016-10-26 DIAGNOSIS — E119 Type 2 diabetes mellitus without complications: Secondary | ICD-10-CM

## 2016-10-26 DIAGNOSIS — Z7982 Long term (current) use of aspirin: Secondary | ICD-10-CM

## 2016-10-26 MED ORDER — LEUPROLIDE ACETATE (4 MONTH) 30 MG IM KIT
30.0000 mg | PACK | Freq: Once | INTRAMUSCULAR | Status: AC
  Administered 2016-10-26: 30 mg via INTRAMUSCULAR
  Filled 2016-10-26: qty 30

## 2016-10-26 NOTE — Progress Notes (Signed)
Patient here for his prostate cancer f/u with Dr. Rogue Bussing. Scheduled for xgeva today. He denies any medical problems.

## 2016-10-26 NOTE — Addendum Note (Signed)
Addended by: Sabino Gasser on: 10/26/2016 02:10 PM   Modules accepted: Orders

## 2016-10-26 NOTE — Assessment & Plan Note (Addendum)
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 Last psa March 2018- 0.1. No clinical progression noted  # right lower jaw pain- clinically less likely ONJ; however HOLD x-geva; follow up with dentist.   # Left ureteral stent/hydronephrosis- likely from pelvic adenopathy from his malignancy. Plan stent exchange this week. Stable.  # CKD- stage III- creat 1.4. Reviewed the labs.   # Follow-up in 4 months; Lupron/X-geva ; labs few days prior.

## 2016-10-26 NOTE — Progress Notes (Signed)
[place] OFFICE PROGRESS NOTE  Patient Care Team: Tamsen Roers, MD as PCP - General (Family Medicine)   SUMMARY OF ONCOLOGIC HISTORY:  Oncology History   # AUG 2016- METASTATIC PROSTATE CA/ STAGE IV; Castrate sensitive [PSA- 48] ; mets- lumbar spine [s/p pal RT to Aug 2016; Dr.Crystal] /Pelvic LN; 1994- Prostate CA s/p Surgery [facility]]; Lupron q4 M; MARCH 2017- Bone scan- L4/Right pubic rami uptake; PSA- 0.1/testosterone-castrate [<3]  # Bone lesions on denosumab;DEC 2016- Recm q 39M  # Left hydronephrosis s/p stenting     Prostate cancer metastatic to bone ([place])   04/11/2015 Initial Diagnosis    Prostate cancer metastatic to bone [facility])       INTERVAL HISTORY:  A very pleasant 76 year old male patient with above history of prostate cancer stage IV hormone sensitive currently on Lupron every 4 months is here for follow-up.   As per the wife patient noted to have pain in his right lower jaw in the last few weeks. Patient think this is likely from his poorly fitting dentures. His appetite is good. He is not losing any weight. No hot flashes. Denies any worsening back pain. No chest pain or shortness of breath or cough.   REVIEW OF SYSTEMS:  A complete 10 point review of system is done which is negative except mentioned above/history of present illness.   PAST MEDICAL HISTORY :  Past Medical History:  Diagnosis Date  . Aortic stenosis, moderate 01/30/2014  . Arthritis   . Atrial flutter ([place]) 02/02/2014  . Diabetes mellitus without complication (HCC)    diet control  . GERD (gastroesophageal reflux disease)   . Heart murmur   . Heart murmur   . HTN (hypertension) 01/30/2014  . Hypercholesteremia   . Hypertension   . Prostate cancer ([place])    metastatic  . Weakness of left leg     PAST SURGICAL HISTORY :   Past Surgical History:  Procedure Laterality Date  . CATARACT EXTRACTION W/ INTRAOCULAR LENS IMPLANT Bilateral   . COLON RESECTION    . CYSTOSCOPY  W/ RETROGRADES Left 06/08/2015   Procedure: CYSTOSCOPY WITH RETROGRADE PYELOGRAM;  Surgeon: Nickie Retort, MD;  Location: [facility] ORS;  Service: Urology;  Laterality: Left;  . CYSTOSCOPY W/ URETERAL STENT PLACEMENT Left 10/05/2015   Procedure: CYSTOSCOPY WITH STENT REPLACEMENT;  Surgeon: Nickie Retort, MD;  Location: [facility] ORS;  Service: Urology;  Laterality: Left;  . CYSTOSCOPY W/ URETERAL STENT PLACEMENT Left 01/04/2016   Procedure: CYSTOSCOPY WITH STENT REPLACEMENT;  Surgeon: Nickie Retort, MD;  Location: [facility] ORS;  Service: Urology;  Laterality: Left;  . CYSTOSCOPY W/ URETERAL STENT PLACEMENT Left 04/11/2016   Procedure: CYSTOSCOPY WITH STENT REPLACEMENT;  Surgeon: Nickie Retort, MD;  Location: [facility] ORS;  Service: Urology;  Laterality: Left;  . CYSTOSCOPY W/ URETERAL STENT PLACEMENT Left 07/18/2016   Procedure: CYSTOSCOPY WITH STENT REPLACEMENT;  Surgeon: Nickie Retort, MD;  Location: [facility] ORS;  Service: Urology;  Laterality: Left;  . EYE SURGERY Bilateral Sept and Oct.2012   cataract  . HERNIA REPAIR    . PROSTATECTOMY  1994    FAMILY HISTORY :   Family History  Problem Relation Age of Onset  . Aneurysm Father   . Cancer Mother     breast    SOCIAL HISTORY:   Social History  Substance Use Topics  . Smoking status: Former Smoker    Packs/day: 1.50    Years: 25.00    Types: Cigarettes  Quit date: 01/21/1974  . Smokeless tobacco: Never Used  . Alcohol use No    ALLERGIES:  has No Known Allergies.  MEDICATIONS:  Current Outpatient Prescriptions  Medication Sig Dispense Refill  . acetaminophen (TYLENOL) 500 MG tablet Take 1,000 mg by mouth at bedtime.    Marland Kitchen allopurinol (ZYLOPRIM) 300 MG tablet Take 300 mg by mouth every morning.     Marland Kitchen aspirin 81 MG tablet Take 81 mg by mouth every morning. Reported on 01/03/2016    . atorvastatin (LIPITOR) 10 MG tablet Take 10 mg by mouth every morning. Reported on 01/04/2016    . calcium carbonate (OSCAL) 1500 (600 Ca) MG  TABS tablet Take 600 mg of elemental calcium by mouth 2 (two) times daily with a meal.    . carvedilol (COREG) 6.25 MG tablet Take 1 tablet (6.25 mg total) by mouth 2 (two) times daily with a meal. 60 tablet 2  . ergocalciferol (VITAMIN D2) 50000 units capsule Take 50,000 Units by mouth. Tuesday    . ferrous sulfate 325 (65 FE) MG tablet Take 325 mg by mouth 2 (two) times daily with a meal.     . oxybutynin (DITROPAN) 5 MG tablet Take 5 mg by mouth 4 (four) times daily.    . pantoprazole (PROTONIX) 40 MG tablet Take 1 tablet (40 mg total) by mouth daily before breakfast. 30 tablet 2  . ranitidine (ZANTAC) 150 MG tablet Take 150 mg by mouth at bedtime.     . verapamil (CALAN-SR) 240 MG CR tablet Take 1 tablet (240 mg total) by mouth daily. (Patient taking differently: Take 240 mg by mouth 2 (two) times daily. ) 30 tablet 2  . vitamin B-12 (CYANOCOBALAMIN) 1000 MCG tablet Take 1,000 mcg by mouth daily.     No current facility-administered medications for this visit.    Facility-Administered Medications Ordered in Other Visits  Medication Dose Route Frequency Provider Last Rate Last Dose  . 0.9 %  sodium chloride infusion   Intravenous Continuous Wylene Simmer, MD        PHYSICAL EXAMINATION: ECOG PERFORMANCE STATUS: 0 - Asymptomatic  BP 140/84 (BP Location: Left Arm, Patient Position: Sitting)   Pulse 63   Temp 97.6 F (36.4 C) (Tympanic)   Resp 18   Ht 5\' 11"  (1.803 m)   Wt 200 lb (90.7 kg)   BMI 27.89 kg/m   Filed Weights   10/26/16 1347  Weight: 200 lb (90.7 kg)    GENERAL: Well-nourished well-developed; Alert, no distress and comfortable.   Accompanied by his wife. Patient limps when walking. EYES: no pallor or icterus OROPHARYNX: no thrush or ulceration; dentures. NECK: supple, no masses felt LYMPH:  no palpable lymphadenopathy in the cervical, axillary or inguinal regions LUNGS: clear to auscultation and  No wheeze or crackles HEART/CVS: regular rate & rhythm and no  murmurs; No lower extremity edema ABDOMEN:abdomen soft, non-tender and normal bowel sounds Musculoskeletal:no cyanosis of digits and no clubbing  PSYCH: alert & oriented x 3 with fluent speech NEURO: no focal motor/sensory deficits SKIN:  no rashes or significant lesions  LABORATORY DATA:  I have reviewed the data as listed    Component Value Date/Time   NA 137 10/19/2016 1335   K 4.5 10/19/2016 1335   CL 105 10/19/2016 1335   CO2 24 10/19/2016 1335   GLUCOSE 124 (H) 10/19/2016 1335   BUN 30 (H) 10/19/2016 1335   CREATININE 1.47 (H) 10/19/2016 1335   CALCIUM 10.0 10/19/2016 1335   PROT 7.4  10/19/2016 1335   ALBUMIN 4.1 10/19/2016 1335   AST 24 10/19/2016 1335   ALT 16 (L) 10/19/2016 1335   ALKPHOS 57 10/19/2016 1335   BILITOT 0.3 10/19/2016 1335   GFRNONAA 45 (L) 10/19/2016 1335   GFRAA 52 (L) 10/19/2016 1335    No results found for: SPEP, UPEP  Lab Results  Component Value Date   WBC 8.9 10/19/2016   NEUTROABS 5.7 10/19/2016   HGB 13.6 10/19/2016   HCT 40.0 10/19/2016   MCV 88.3 10/19/2016   PLT 181 10/19/2016      Chemistry      Component Value Date/Time   NA 137 10/19/2016 1335   K 4.5 10/19/2016 1335   CL 105 10/19/2016 1335   CO2 24 10/19/2016 1335   BUN 30 (H) 10/19/2016 1335   CREATININE 1.47 (H) 10/19/2016 1335      Component Value Date/Time   CALCIUM 10.0 10/19/2016 1335   ALKPHOS 57 10/19/2016 1335   AST 24 10/19/2016 1335   ALT 16 (L) 10/19/2016 1335   BILITOT 0.3 10/19/2016 1335      Results for CODIE, KROGH (MRN [id number]) as of 10/26/2016 13:49  Ref. Range 11/21/2007 11:15 03/24/2015 12:45 03/31/2015 15:21 07/04/2015 13:22 10/20/2015 14:45 11/21/2015 13:31 03/07/2016 14:55 06/28/2016 13:40 10/19/2016 13:35  PSA Latest Ref Range: 0.00 - 4.00 ng/mL  48.22 (H) 48.3 (H) 1.39 0.15 0.12 0.06 0.02 0.10     ASSESSMENT & PLAN:   Prostate cancer metastatic to bone [facility]) # Metastatic castrate sensitive prostate cancer- currently on Lupron/X-geva every 4  months. Last psa March 2018- 0.1. No clinical progression noted  # right lower jaw pain- clinically less likely ONJ; however HOLD x-geva; follow up with dentist.   # Left ureteral stent/hydronephrosis- likely from pelvic adenopathy from his malignancy. Plan stent exchange this week. Stable.  # CKD- stage III- creat 1.4. Reviewed the labs.   # Follow-up in 4 months; Lupron/X-geva ; labs few days prior.     Cammie Sickle, MD 10/26/2016 2:05 PM

## 2016-11-01 MED ORDER — CEFAZOLIN SODIUM-DEXTROSE 2-4 GM/100ML-% IV SOLN
2.0000 g | INTRAVENOUS | Status: AC
  Administered 2016-11-02: 2 g via INTRAVENOUS

## 2016-11-02 ENCOUNTER — Ambulatory Visit: Payer: Medicare PPO | Admitting: Anesthesiology

## 2016-11-02 ENCOUNTER — Encounter
Admission: RE | Disposition: A | Discharge status: Home / Self Care | Payer: Self-pay | Source: Ambulatory Visit | Attending: Urology

## 2016-11-02 ENCOUNTER — Ambulatory Visit
Admission: RE | Admit: 2016-11-02 | Discharge: 2016-11-02 | Disposition: A | Discharge status: Home / Self Care | Payer: Medicare PPO | Source: Ambulatory Visit | Attending: Urology | Admitting: Urology

## 2016-11-02 DIAGNOSIS — I252 Old myocardial infarction: Secondary | ICD-10-CM | POA: Insufficient documentation

## 2016-11-02 DIAGNOSIS — N1339 Other hydronephrosis: Secondary | ICD-10-CM

## 2016-11-02 DIAGNOSIS — N1330 Unspecified hydronephrosis: Secondary | ICD-10-CM | POA: Diagnosis not present

## 2016-11-02 DIAGNOSIS — Z7984 Long term (current) use of oral hypoglycemic drugs: Secondary | ICD-10-CM | POA: Diagnosis not present

## 2016-11-02 DIAGNOSIS — Z7982 Long term (current) use of aspirin: Secondary | ICD-10-CM | POA: Insufficient documentation

## 2016-11-02 DIAGNOSIS — Z9079 Acquired absence of other genital organ(s): Secondary | ICD-10-CM | POA: Insufficient documentation

## 2016-11-02 DIAGNOSIS — C61 Malignant neoplasm of prostate: Secondary | ICD-10-CM

## 2016-11-02 DIAGNOSIS — K219 Gastro-esophageal reflux disease without esophagitis: Secondary | ICD-10-CM | POA: Insufficient documentation

## 2016-11-02 DIAGNOSIS — Z79899 Other long term (current) drug therapy: Secondary | ICD-10-CM | POA: Insufficient documentation

## 2016-11-02 DIAGNOSIS — R590 Localized enlarged lymph nodes: Secondary | ICD-10-CM | POA: Insufficient documentation

## 2016-11-02 DIAGNOSIS — C7951 Secondary malignant neoplasm of bone: Secondary | ICD-10-CM | POA: Diagnosis not present

## 2016-11-02 DIAGNOSIS — D649 Anemia, unspecified: Secondary | ICD-10-CM | POA: Insufficient documentation

## 2016-11-02 DIAGNOSIS — Z87891 Personal history of nicotine dependence: Secondary | ICD-10-CM | POA: Diagnosis not present

## 2016-11-02 DIAGNOSIS — Z466 Encounter for fitting and adjustment of urinary device: Secondary | ICD-10-CM | POA: Diagnosis not present

## 2016-11-02 DIAGNOSIS — I10 Essential (primary) hypertension: Secondary | ICD-10-CM | POA: Insufficient documentation

## 2016-11-02 DIAGNOSIS — E119 Type 2 diabetes mellitus without complications: Secondary | ICD-10-CM | POA: Diagnosis not present

## 2016-11-02 DIAGNOSIS — E7800 Pure hypercholesterolemia, unspecified: Secondary | ICD-10-CM | POA: Insufficient documentation

## 2016-11-02 HISTORY — PX: CYSTOSCOPY W/ URETERAL STENT PLACEMENT: SHX1429

## 2016-11-02 LAB — GLUCOSE, CAPILLARY
GLUCOSE-CAPILLARY: 118 mg/dL — AB (ref 65–99)
Glucose-Capillary: 130 mg/dL — ABNORMAL HIGH (ref 65–99)

## 2016-11-02 SURGERY — CYSTOSCOPY, FLEXIBLE, WITH STENT REPLACEMENT
Anesthesia: General | Site: Ureter | Laterality: Left | Wound class: Clean Contaminated

## 2016-11-02 MED ORDER — LIDOCAINE HCL (PF) 2 % IJ SOLN
INTRAMUSCULAR | Status: AC
  Filled 2016-11-02: qty 2

## 2016-11-02 MED ORDER — MIDAZOLAM HCL 2 MG/2ML IJ SOLN
INTRAMUSCULAR | Status: DC | PRN
  Administered 2016-11-02: 1 mg via INTRAVENOUS

## 2016-11-02 MED ORDER — FENTANYL CITRATE (PF) 100 MCG/2ML IJ SOLN: 25.0000 ug | INTRAMUSCULAR | Status: DC | PRN

## 2016-11-02 MED ORDER — PHENYLEPHRINE HCL 10 MG/ML IJ SOLN
INTRAMUSCULAR | Status: DC | PRN
  Administered 2016-11-02: 200 ug via INTRAVENOUS

## 2016-11-02 MED ORDER — DEXAMETHASONE SODIUM PHOSPHATE 10 MG/ML IJ SOLN
INTRAMUSCULAR | Status: AC
  Filled 2016-11-02: qty 1

## 2016-11-02 MED ORDER — LIDOCAINE HCL (CARDIAC) 20 MG/ML IV SOLN
INTRAVENOUS | Status: DC | PRN
  Administered 2016-11-02: 100 mg via INTRAVENOUS

## 2016-11-02 MED ORDER — GLYCOPYRROLATE 0.2 MG/ML IJ SOLN
INTRAMUSCULAR | Status: AC
  Filled 2016-11-02: qty 1

## 2016-11-02 MED ORDER — DEXAMETHASONE SODIUM PHOSPHATE 10 MG/ML IJ SOLN
INTRAMUSCULAR | Status: DC | PRN
  Administered 2016-11-02: 5 mg via INTRAVENOUS

## 2016-11-02 MED ORDER — GLYCOPYRROLATE 0.2 MG/ML IJ SOLN
INTRAMUSCULAR | Status: DC | PRN
  Administered 2016-11-02: 0.2 mg via INTRAVENOUS

## 2016-11-02 MED ORDER — EPHEDRINE SULFATE 50 MG/ML IJ SOLN
INTRAMUSCULAR | Status: DC | PRN
  Administered 2016-11-02: 10 mg via INTRAVENOUS

## 2016-11-02 MED ORDER — PROPOFOL 10 MG/ML IV BOLUS
INTRAVENOUS | Status: DC | PRN
  Administered 2016-11-02: 150 mg via INTRAVENOUS

## 2016-11-02 MED ORDER — CEPHALEXIN 500 MG PO CAPS: 500.0000 mg | ORAL_CAPSULE | Freq: Three times a day (TID) | ORAL | 0 refills | Status: DC

## 2016-11-02 MED ORDER — HYDROCODONE-ACETAMINOPHEN 5-325 MG PO TABS: 1.0000 | ORAL_TABLET | ORAL | 0 refills | Status: DC | PRN

## 2016-11-02 MED ORDER — ONDANSETRON HCL 4 MG/2ML IJ SOLN
INTRAMUSCULAR | Status: AC
  Filled 2016-11-02: qty 2

## 2016-11-02 MED ORDER — SODIUM CHLORIDE 0.9 % IV SOLN
INTRAVENOUS | Status: DC
  Administered 2016-11-02: 08:00:00 via INTRAVENOUS

## 2016-11-02 MED ORDER — MIDAZOLAM HCL 2 MG/2ML IJ SOLN
INTRAMUSCULAR | Status: AC
  Filled 2016-11-02: qty 2

## 2016-11-02 MED ORDER — CEFAZOLIN SODIUM-DEXTROSE 2-4 GM/100ML-% IV SOLN
INTRAVENOUS | Status: AC
  Administered 2016-11-02: 2 g via INTRAVENOUS
  Filled 2016-11-02: qty 100

## 2016-11-02 MED ORDER — FENTANYL CITRATE (PF) 100 MCG/2ML IJ SOLN
INTRAMUSCULAR | Status: DC | PRN
  Administered 2016-11-02: 25 ug via INTRAVENOUS

## 2016-11-02 MED ORDER — ONDANSETRON HCL 4 MG/2ML IJ SOLN: 4.0000 mg | Freq: Once | INTRAMUSCULAR | Status: DC | PRN

## 2016-11-02 MED ORDER — FENTANYL CITRATE (PF) 100 MCG/2ML IJ SOLN
INTRAMUSCULAR | Status: AC
  Filled 2016-11-02: qty 2

## 2016-11-02 MED ORDER — ONDANSETRON HCL 4 MG/2ML IJ SOLN
INTRAMUSCULAR | Status: DC | PRN
  Administered 2016-11-02: 4 mg via INTRAVENOUS

## 2016-11-02 MED ORDER — PROPOFOL 10 MG/ML IV BOLUS
INTRAVENOUS | Status: AC
  Filled 2016-11-02: qty 20

## 2016-11-02 SURGICAL SUPPLY — 16 items
GLOVE BIO SURGEON STRL SZ7.5 (GLOVE) ×3 IMPLANT
GOWN STRL REUS W/ TWL LRG LVL4 (GOWN DISPOSABLE) ×1 IMPLANT
GOWN STRL REUS W/TWL LRG LVL4 (GOWN DISPOSABLE) ×3
GOWN STRL REUS W/TWL XL LVL3 (GOWN DISPOSABLE) ×3 IMPLANT
INTRODUCER DILATOR DOUBLE (INTRODUCER) ×3 IMPLANT
KIT RM TURNOVER CYSTO AR (KITS) ×3 IMPLANT
PACK CYSTO AR (MISCELLANEOUS) ×3 IMPLANT
SENSORWIRE 0.038 NOT ANGLED (WIRE) ×3
SET CYSTO W/LG BORE CLAMP LF (SET/KITS/TRAYS/PACK) ×3 IMPLANT
SOL .9 NS 3000ML IRR  AL (IV SOLUTION) ×2
SOL .9 NS 3000ML IRR UROMATIC (IV SOLUTION) ×1 IMPLANT
STENT URET 6FRX24 CONTOUR (STENTS) IMPLANT
STENT URET 6FRX26 CONTOUR (STENTS) ×3 IMPLANT
SURGILUBE 2OZ TUBE FLIPTOP (MISCELLANEOUS) ×3 IMPLANT
WATER STERILE IRR 1000ML POUR (IV SOLUTION) ×3 IMPLANT
WIRE SENSOR 0.038 NOT ANGLED (WIRE) ×1 IMPLANT

## 2016-11-02 NOTE — Op Note (Signed)
Preoperative diagnosis:  1. Left hydronephrosis 2. Stage IV metastatic prostate cancer  Postoperative diagnosis:  1. Left hydronephrosis 2. Stage IV metastatic prostate cancer  Procedure: 1. Cystoscopy 2. Left ureteral stent exchange 6 [place] by 26 cm 3. Left retrograde pyelogram with interpertation  Surgeon: Baruch Gouty, MD  Anesthesia: General  Complications: None  Intraoperative findings: The stent was confirmed to be in the correct location on fluoroscopy. Left hydronephrosis was persistent on left retrograde pyelogram.  EBL: None  Specimens: None  Drains: 6 French by 26 cm double-J left ureteral stent  Disposition: Stable to the postanesthesia care unit  Indication for procedure: The patient is a 76 y.o. male with stage IV metastatic prostate cancer with left hydroureteronephrosis presents today for left ureteral stent exchange. After reviewing the management options for treatment, the patient elected to proceed with the above surgical procedure(s). We have discussed the potential benefits and risks of the procedure, side effects of the proposed treatment, the likelihood of the patient achieving the goals of the procedure, and any potential problems that might occur during the procedure or recuperation. Informed consent has been obtained.  Description of procedure: The patient was met in the preoperative area. All risks, benefits, and indications of the procedure were described in great detail. The patient consented to the procedure. Preoperative antibiotics were given. The patient was taken to the operative theater. General anesthesia was induced per the anesthesia service. The patient was then placed in the dorsal lithotomy position and prepped and draped in the usual sterile fashion. A preoperative timeout was called. A 21 French 30 cystoscope was inserted into the patient's bladder per urethra atraumatically. The left ureteral stent was grasped flexible  graspers brought to level urethral meatus. Prior to this though, fluoroscopy was used to locate the level of the renal pelvis via stent curl. Sensor was exchanged left ureteral stent up to level the renal pelvis and the stent was removed.6 and opening catheter exchanged for the sensor wire and a retrograde pyelogram on the left side to place. This showed the patient still having persistent hydronephrosis. The open ended catheter was then exchanged for a sensor wire. The cystoscope was reassembled over the sensor wire inserted back into the patient's bladder. A 6 French by 26 cm double-J ureteral stent was then placed over the sensor wire and advanced to thelevel of the renal pelvis under fluoroscopy. The sensor wire was removed. A curl seen was seen in the patient's left renal pelvis indicating proximal correct placement on fluoroscopy. The correct distal placement of the stent was confirmed with direct position within the patient's urinary bladder. There was clear yellow urine draining from the stent further confirming correct placement. The patient's bladder was then drained. The patient was woke from anesthesia and transferred to postanesthesia care unit without apparent complication.  Plan: The patient will follow-up in 3 months for repeat stent exchange. His metastatic stage IV prostate cancer is managed via Lupron/Xgevaby medical oncology.

## 2016-11-02 NOTE — Anesthesia Post-op Follow-up Note (Cosign Needed)
Anesthesia QCDR form completed.        

## 2016-11-02 NOTE — Anesthesia Preprocedure Evaluation (Signed)
Anesthesia Evaluation  Patient identified by MRN, date of birth, ID band Patient awake    Reviewed: Allergy & Precautions, NPO status , Patient's Chart, lab work & pertinent test results  History of Anesthesia Complications Negative for: history of anesthetic complications  Airway Mallampati: III       Dental  (+) Upper Dentures, Lower Dentures   Pulmonary neg pulmonary ROS, former smoker,           Cardiovascular hypertension, Pt. on home beta blockers and Pt. on medications + Past MI (pt denies')  + Valvular Problems/Murmurs (murmur, no tx)      Neuro/Psych negative neurological ROS     GI/Hepatic GERD  Medicated and Controlled,  Endo/Other  diabetes, Type 2, Oral Hypoglycemic Agents  Renal/GU      Musculoskeletal   Abdominal   Peds  Hematology  (+) anemia ,   Anesthesia Other Findings   Reproductive/Obstetrics                             Anesthesia Physical Anesthesia Plan  ASA: III  Anesthesia Plan: General   Post-op Pain Management:    Induction: Intravenous  Airway Management Planned: LMA  Additional Equipment:   Intra-op Plan:   Post-operative Plan:   Informed Consent: I have reviewed the patients History and Physical, chart, labs and discussed the procedure including the risks, benefits and alternatives for the proposed anesthesia with the patient or authorized representative who has indicated his/her understanding and acceptance.     Plan Discussed with:   Anesthesia Plan Comments:         Anesthesia Quick Evaluation

## 2016-11-02 NOTE — H&P (Signed)
[name] [birth year] [id number]  Referring provider: Tamsen Roers, MD [address], [place] [address]      Chief Complaint  Patient presents with  . Pre-op Exam    Hydronephrosis with ureteral stricture    HPI: The patient is a 76 year old gentleman with a known past medical history of stage IV metastatic prostate cancer. He presents today with left hydroureteronephrosis. This was seen on both the recent CT scan and bone scan. He also has what appears to be a mass in his prostatic bed and possibly growing into his bladder. He had a radical prostatectomy in 1992. We do not have records from this procedure. The patient does not remember where this was done.   The patient is currently temporized with stent changes every 3 months for his left hydroureteronephrosis. 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   PMH:      Past Medical History  Diagnosis Date  . Diabetes mellitus without complication ([place])   . Hypertension   . Hypercholesteremia   . GERD (gastroesophageal reflux disease)   . Prostate cancer ([place])     metastatic  . Arthritis   . Heart murmur   . Aortic stenosis, moderate 01/30/2014  . Atrial flutter ([place]) 02/02/2014  . HTN (hypertension) 01/30/2014  . NSTEMI (non-ST elevated myocardial infarction) ([place]) 01/30/2014    Surgical History:       Past Surgical History  Procedure Laterality Date  . Prostatectomy  1994  . Colon resection    . Eye surgery    . Cystoscopy w/ retrogrades Left 06/08/2015    Procedure: CYSTOSCOPY WITH RETROGRADE PYELOGRAM; Surgeon: Nickie Retort, MD; Location: [facility] ORS; Service: Urology; Laterality: Left;    Home Medications:           Medication List               This list is accurate as of:09/16/15 3:21  PM.Always use your most recent med list.                     allopurinol 300 MG tablet  Commonly known as: ZYLOPRIM  Take 300 mg by mouth daily.     aspirin 81 MG chewable tablet  Chew 1 tablet (81 mg total) by mouth daily.     atorvastatin 10 MG tablet  Commonly known as: LIPITOR     carvedilol 6.25 MG tablet  Commonly known as: COREG  Take 1 tablet (6.25 mg total) by mouth 2 (two) times daily with a meal.     ferrous sulfate 325 (65 FE) MG tablet  Take 325 mg by mouth 2 (two) times daily with a meal.     lovastatin 20 MG tablet  Commonly known as: MEVACOR  Take 20 mg by mouth daily. Reported on 09/16/2015     oxybutynin 5 MG tablet  Commonly known as: DITROPAN  Take 5 mg by mouth 4 (four) times daily.     pantoprazole 40 MG tablet  Commonly known as: PROTONIX  Take 1 tablet (40 mg total) by mouth daily before breakfast.     ranitidine 150 MG tablet  Commonly known as: ZANTAC  Take 150 mg by mouth 2 (two) times daily.     traMADol 50 MG tablet  Commonly known as: ULTRAM  Take 1 tablet (50 mg total) by mouth 2 (two) times daily as needed.  verapamil 240 MG CR tablet  Commonly known as: CALAN-SR  Take 1 tablet (240 mg total) by mouth daily.     VITAMIN B 12 PO  Take 1,000 mcg by mouth daily.           Allergies: No Known Allergies  Family History:      Family History  Problem Relation Age of Onset  . Aneurysm Father   . Cancer Mother     Social History:reports that he quit smoking about 41 years ago. His smoking use included Cigarettes. He has a 37.5 pack-year smoking history. He has never used smokeless tobacco. He reports that he does not drink alcohol or use illicit drugs.  ROS: UROLOGY Frequent Urination?: No Hard to postpone urination?: No Burning/pain with urination?: No Get up at night to urinate?: Yes Leakage of urine?:  Yes Urine stream starts and stops?: No Trouble starting stream?: No Do you have to strain to urinate?: No Blood in urine?: No Urinary tract infection?: No Sexually transmitted disease?: No Injury to kidneys or bladder?: No Painful intercourse?: No Weak stream?: No Erection problems?: No Penile pain?: No  Gastrointestinal Nausea?: No Vomiting?: No Indigestion/heartburn?: No Diarrhea?: No Constipation?: No  Constitutional Fever: No Night sweats?: No Weight loss?: No Fatigue?: No  Skin Skin rash/lesions?: No Itching?: No  Eyes Blurred vision?: No Double vision?: No  Ears/Nose/Throat Sore throat?: No Sinus problems?: No  Hematologic/Lymphatic Swollen glands?: No Easy bruising?: No  Cardiovascular Leg swelling?: No Chest pain?: No  Respiratory Cough?: No Shortness of breath?: No  Endocrine Excessive thirst?: Yes  Musculoskeletal Back pain?: Yes Joint pain?: No  Neurological Headaches?: No Dizziness?: No  Psychologic Depression?: No Anxiety?: No  Physical Exam: BP 124/66 mmHg  Pulse 69  Ht 5\' 11"  (1.803 m)  Wt 214 lb 14.4 oz (97.478 kg)  BMI 29.99 kg/m2  Constitutional: Alert and oriented, No acute distress. HEENT: [place] AT, moist mucus membranes. Trachea midline, no masses. Cardiovascular: No clubbing, cyanosis, or edema. RRR Respiratory: Normal respiratory effort, no increased work of breathing. clear GI: Abdomen is soft, nontender, nondistended, no abdominal masses GU: No CVA tenderness. Skin: No rashes, bruises or suspicious lesions. Lymph: No cervical or inguinal adenopathy. Neurologic: Grossly intact, no focal deficits, moving all 4 extremities. Psychiatric: Normal mood and affect.  Laboratory Data:      Recent Labs          Lab Results  Component Value Date   WBC 10.1 08/01/2015   HGB 13.6 08/01/2015   HCT 41.4 08/01/2015   MCV 87.1 08/01/2015   PLT 183 08/01/2015            Recent Labs          Lab Results  Component Value Date   CREATININE 1.40* 08/01/2015           Recent Labs          Lab Results  Component Value Date   PSA 1.39 07/04/2015   PSA 48.22* 03/24/2015           Recent Labs          Lab Results  Component Value Date   TESTOSTERONE 228* 03/24/2015           Recent Labs          Lab Results  Component Value Date   HGBA1C 5.5 01/30/2014      Urinalysis      Labs (Brief)  Component Value Date/Time   COLORURINE YELLOW 01/13/2014 2146   APPEARANCEUR CLOUDY* 01/13/2014 2146   LABSPEC 1.017 01/13/2014 2146   PHURINE 5.0 01/13/2014 2146   GLUCOSEU Negative 05/10/2015 1335   HGBUR LARGE* 01/13/2014 2146   BILIRUBINUR Negative 05/10/2015 1335   BILIRUBINUR NEGATIVE 01/13/2014 2146   KETONESUR NEGATIVE 01/13/2014 2146   PROTEINUR 100* 01/13/2014 2146   UROBILINOGEN 0.2 01/13/2014 2146   NITRITE Negative 05/10/2015 1335   NITRITE NEGATIVE 01/13/2014 2146   LEUKOCYTESUR Negative 05/10/2015 1335   LEUKOCYTESUR NEGATIVE 01/13/2014 2146       Assessment & Plan:    1. Left Hydroureteronephrosis The patient is due for a left ureteral stent exchange.   2. Metastatic prostate cancer - Stage IV with metastases to lumbar spine, pelvic lymphadenopathy  He is under the care of Dr. Rogue Bussing on Lupron and Delton See for bone mets. We will defer further management to medical oncology.

## 2016-11-02 NOTE — Transfer of Care (Signed)
Immediate Anesthesia Transfer of Care Note  Patient: [name]  Procedure(s) Performed: Procedure(s): CYSTOSCOPY WITH STENT REPLACEMENT (Left)  Patient Location: PACU  Anesthesia Type:General  Level of Consciousness: sedated  Airway & Oxygen Therapy: Patient Spontanous Breathing and Patient connected to face mask oxygen  Post-op Assessment: Report given to RN and Post -op Vital signs reviewed and stable  Post vital signs: Reviewed and stable  Last Vitals:  Vitals:   11/02/16 0800 11/02/16 0935  BP: (!) 159/84 115/70  Pulse: 72 79  Resp: 18 11  Temp: 36.1 C 06.9 C    Complications: No apparent anesthesia complications

## 2016-11-02 NOTE — Anesthesia Procedure Notes (Signed)
Procedure Name: LMA Insertion Date/Time: 11/02/2016 9:00 AM Performed by: Doreen Salvage Pre-anesthesia Checklist: Patient identified, Patient being monitored, Timeout performed, Emergency Drugs available and Suction available Patient Re-evaluated:Patient Re-evaluated prior to inductionOxygen Delivery Method: Circle system utilized Preoxygenation: Pre-oxygenation with 100% oxygen Intubation Type: IV induction Ventilation: Mask ventilation without difficulty LMA: LMA inserted LMA Size: 4.0 Tube type: Oral Number of attempts: 1 Placement Confirmation: positive ETCO2 and breath sounds checked- equal and bilateral Tube secured with: Tape Dental Injury: Teeth and Oropharynx as per pre-operative assessment

## 2016-11-02 NOTE — Anesthesia Postprocedure Evaluation (Signed)
Anesthesia Post Note  Patient: [name]  Procedure(s) Performed: Procedure(s) (LRB): CYSTOSCOPY WITH STENT REPLACEMENT (Left)  Patient location during evaluation: PACU Anesthesia Type: General Level of consciousness: awake and alert Pain management: pain level controlled Vital Signs Assessment: post-procedure vital signs reviewed and stable Respiratory status: spontaneous breathing and respiratory function stable Cardiovascular status: stable Anesthetic complications: no     Last Vitals:  Vitals:   11/02/16 0800 11/02/16 0935  BP: (!) 159/84 115/70  Pulse: 72 79  Resp: 18 11  Temp: 36.1 C 36.2 C    Last Pain:  Vitals:   11/02/16 0935  TempSrc:   PainSc: (P) Asleep                 Tyshawn Keel K

## 2016-11-02 NOTE — Discharge Instructions (Signed)
AMBULATORY SURGERY  DISCHARGE INSTRUCTIONS   1) The drugs that you were given will stay in your system until tomorrow so for the next 24 hours you should not:  A) Drive an automobile B) Make any legal decisions C) Drink any alcoholic beverage   2) You may resume regular meals tomorrow.  Today it is better to start with liquids and gradually work up to solid foods.  You may eat anything you prefer, but it is better to start with liquids, then soup and crackers, and gradually work up to solid foods.   3) Please notify your doctor immediately if you have any unusual bleeding, trouble breathing, redness and pain at the surgery site, drainage, fever, or pain not relieved by medication.    Please contact your physician with any problems or Same Day Surgery at [phone], Monday through Friday 6 am to 4 pm, or [place] [facility] number at [phone].   Ureteral Stent Implantation, Care After Refer to this sheet in the next few weeks. These instructions provide you with information about caring for yourself after your procedure. Your health care provider may also give you more specific instructions. Your treatment has been planned according to current medical practices, but problems sometimes occur. Call your health care provider if you have any problems or questions after your procedure. What can I expect after the procedure? After the procedure, it is common to have:  Nausea.  Mild pain when you urinate. You may feel this pain in your lower back or lower abdomen. Pain should stop within a few minutes after you urinate. This may last for up to 1 week.  A small amount of blood in your urine for several days. Follow these instructions at home:   Medicines   Take over-the-counter and prescription medicines only as told by your health care provider.  If you were prescribed an antibiotic medicine, take it as told by your health care provider. Do not stop taking the antibiotic  even if you start to feel better.  Do not drive for 24 hours if you received a sedative.  Do not drive or operate heavy machinery while taking prescription pain medicines. Activity   Return to your normal activities as told by your health care provider. Ask your health care provider what activities are safe for you.  Do not lift anything that is heavier than 10 lb (4.5 kg). Follow this limit for 1 week after your procedure, or for as long as told by your health care provider. General instructions   Watch for any blood in your urine. Call your health care provider if the amount of blood in your urine increases.  If you have a catheter:  Follow instructions from your health care provider about taking care of your catheter and collection bag.  Do not take baths, swim, or use a hot tub until your health care provider approves.  Drink enough fluid to keep your urine clear or pale yellow.  Keep all follow-up visits as told by your health care provider. This is important. Contact a health care provider if:  You have pain that gets worse or does not get better with medicine, especially pain when you urinate.  You have difficulty urinating.  You feel nauseous or you vomit repeatedly during a period of more than 2 days after the procedure. Get help right away if:  Your urine is dark red or has blood clots in it.  You are leaking urine (have incontinence).  The end  of the stent comes out of your urethra.  You cannot urinate.  You have sudden, sharp, or severe pain in your abdomen or lower back.  You have a fever. This information is not intended to replace advice given to you by your health care provider. Make sure you discuss any questions you have with your health care provider. Document Released: 04/08/2013 Document Revised: 01/12/2016 Document Reviewed: 02/18/2015 Elsevier Interactive Patient Education  2017 [employer].

## 2016-11-03 ENCOUNTER — Encounter: Payer: Self-pay | Admitting: Urology

## 2016-11-07 ENCOUNTER — Telehealth: Payer: Self-pay

## 2016-11-07 NOTE — Telephone Encounter (Signed)
Pt called stating he had a stent exchange on 3/16 and has had a sore throat since. Reinforced with pt that is most likely from anesthesia and should get better as time goes on. Pt voiced understanding.

## 2016-12-11 ENCOUNTER — Telehealth: Payer: Self-pay | Admitting: Radiology

## 2016-12-11 NOTE — Telephone Encounter (Signed)
-----   Message from Nickie Retort, MD sent at 11/02/2016  9:30 AM EDT ----- Patient needs cysto, left stent exchange in 3 months. Thanks.

## 2017-01-07 ENCOUNTER — Other Ambulatory Visit: Payer: Self-pay | Admitting: Radiology

## 2017-01-07 DIAGNOSIS — N1339 Other hydronephrosis: Secondary | ICD-10-CM

## 2017-01-07 DIAGNOSIS — C61 Malignant neoplasm of prostate: Secondary | ICD-10-CM

## 2017-01-07 NOTE — Telephone Encounter (Signed)
Notified pt of surgery scheduled with Dr Pilar Jarvis on 02/08/17, pre-admit testing appt on 01/25/17 @9 :30 & to call day prior to surgery for arrival time to [facility]. Advised pt to continue ASA 81mg  per Dr Pilar Jarvis. Pt voices understanding.

## 2017-01-25 ENCOUNTER — Encounter
Admission: RE | Admit: 2017-01-25 | Discharge: 2017-01-25 | Disposition: A | Discharge status: Home / Self Care | Payer: Medicare PPO | Source: Ambulatory Visit | Attending: Urology | Admitting: Urology

## 2017-01-25 DIAGNOSIS — Z01810 Encounter for preprocedural cardiovascular examination: Secondary | ICD-10-CM | POA: Diagnosis present

## 2017-01-25 DIAGNOSIS — Z01812 Encounter for preprocedural laboratory examination: Secondary | ICD-10-CM | POA: Diagnosis present

## 2017-01-25 DIAGNOSIS — R9431 Abnormal electrocardiogram [ECG] [EKG]: Secondary | ICD-10-CM | POA: Insufficient documentation

## 2017-01-25 DIAGNOSIS — I10 Essential (primary) hypertension: Secondary | ICD-10-CM

## 2017-01-25 NOTE — Patient Instructions (Addendum)
  Your procedure is scheduled EA:[id number] February 08, 2017. Report to Same Day Surgery. To find out your arrival time please call [phone] between 1PM - 3PM on Thursday February 07, 2017.  Remember: Instructions that are not followed completely may result in serious medical risk, up to and including death, or upon the discretion of your surgeon and anesthesiologist your surgery may need to be rescheduled.    _x___ 1. Do not eat food or drink liquids after midnight. No gum chewing or hard candies.     ____ 2. No Alcohol for 24 hours before or after surgery.   ____ 3. Bring all medications with you on the day of surgery if instructed.    __x__ 4. Notify your doctor if there is any change in your medical condition     (cold, fever, infections).    _____ 5. No smoking 24 hours prior to surgery.     Do not wear jewelry, make-up, hairpins, clips or nail polish.  Do not wear lotions, powders, or perfumes.   Do not shave 48 hours prior to surgery. Men may shave face and neck.  Do not bring valuables to the hospital.    [facility] is not responsible for any belongings or valuables.               Contacts, dentures or bridgework may not be worn into surgery.  Leave your suitcase in the car. After surgery it may be brought to your room.  For patients admitted to the hospital, discharge time is determined by your treatment team.   Patients discharged the day of surgery will not be allowed to drive home.    Please read over the following fact sheets that you were given:   [facility] Preparing for Surgery  _x___ Take these medicines the morning of surgery with A SIP OF WATER:    1. Carvedilol/ coreg  2. Pantoprazole/ Protonix  3. Verapamil/ Calan SR   ____ [employer] (as directed)   ____ Use CHG Soap as directed on instruction sheet  ____ Use inhalers on the day of surgery and bring to hospital day of surgery  ____ Stop metformin 2 days prior to surgery    ____ Take 1/2 of usual  insulin dose the night before surgery and none on the morning of surgery.   ____ Stop Coumadin/Plavix/aspirin on does not apply.  __x__ Stop Anti-inflammatories such as Advil, Aleve, Ibuprofen, Motrin, Naproxen, Naprosyn, Goodies powders or aspirin products. OK to take Tylenol.   ____ Stop supplements until after surgery.    ____ Bring C-Pap to the hospital.

## 2017-01-26 LAB — URINE CULTURE: CULTURE: NO GROWTH

## 2017-02-07 MED ORDER — CEFAZOLIN SODIUM-DEXTROSE 2-4 GM/100ML-% IV SOLN
2.0000 g | INTRAVENOUS | Status: AC
Start: 2017-02-07 — End: 2017-02-08
  Administered 2017-02-08: 2 g via INTRAVENOUS

## 2017-02-08 ENCOUNTER — Ambulatory Visit: Payer: Medicare PPO | Admitting: Certified Registered Nurse Anesthetist

## 2017-02-08 ENCOUNTER — Ambulatory Visit
Admission: RE | Admit: 2017-02-08 | Discharge: 2017-02-08 | Disposition: A | Discharge status: Home / Self Care | Payer: Medicare PPO | Source: Ambulatory Visit | Attending: Urology | Admitting: Urology

## 2017-02-08 ENCOUNTER — Encounter
Admission: RE | Disposition: A | Discharge status: Home / Self Care | Payer: Self-pay | Source: Ambulatory Visit | Attending: Urology

## 2017-02-08 DIAGNOSIS — I10 Essential (primary) hypertension: Secondary | ICD-10-CM | POA: Insufficient documentation

## 2017-02-08 DIAGNOSIS — C61 Malignant neoplasm of prostate: Secondary | ICD-10-CM | POA: Insufficient documentation

## 2017-02-08 DIAGNOSIS — Z79899 Other long term (current) drug therapy: Secondary | ICD-10-CM | POA: Diagnosis not present

## 2017-02-08 DIAGNOSIS — E119 Type 2 diabetes mellitus without complications: Secondary | ICD-10-CM | POA: Diagnosis not present

## 2017-02-08 DIAGNOSIS — Z9079 Acquired absence of other genital organ(s): Secondary | ICD-10-CM | POA: Insufficient documentation

## 2017-02-08 DIAGNOSIS — K219 Gastro-esophageal reflux disease without esophagitis: Secondary | ICD-10-CM | POA: Diagnosis not present

## 2017-02-08 DIAGNOSIS — Z7982 Long term (current) use of aspirin: Secondary | ICD-10-CM | POA: Diagnosis not present

## 2017-02-08 DIAGNOSIS — I252 Old myocardial infarction: Secondary | ICD-10-CM | POA: Diagnosis not present

## 2017-02-08 DIAGNOSIS — N131 Hydronephrosis with ureteral stricture, not elsewhere classified: Secondary | ICD-10-CM | POA: Diagnosis not present

## 2017-02-08 DIAGNOSIS — N1339 Other hydronephrosis: Secondary | ICD-10-CM

## 2017-02-08 DIAGNOSIS — D649 Anemia, unspecified: Secondary | ICD-10-CM | POA: Diagnosis not present

## 2017-02-08 DIAGNOSIS — Z87891 Personal history of nicotine dependence: Secondary | ICD-10-CM | POA: Insufficient documentation

## 2017-02-08 DIAGNOSIS — R599 Enlarged lymph nodes, unspecified: Secondary | ICD-10-CM | POA: Diagnosis not present

## 2017-02-08 DIAGNOSIS — Z466 Encounter for fitting and adjustment of urinary device: Secondary | ICD-10-CM | POA: Diagnosis not present

## 2017-02-08 DIAGNOSIS — N1330 Unspecified hydronephrosis: Secondary | ICD-10-CM | POA: Diagnosis not present

## 2017-02-08 DIAGNOSIS — C7951 Secondary malignant neoplasm of bone: Secondary | ICD-10-CM | POA: Insufficient documentation

## 2017-02-08 HISTORY — PX: CYSTOSCOPY W/ URETERAL STENT PLACEMENT: SHX1429

## 2017-02-08 LAB — GLUCOSE, CAPILLARY: Glucose-Capillary: 123 mg/dL — ABNORMAL HIGH (ref 65–99)

## 2017-02-08 SURGERY — CYSTOSCOPY, FLEXIBLE, WITH STENT REPLACEMENT
Anesthesia: General | Laterality: Left

## 2017-02-08 MED ORDER — LACTATED RINGERS IV SOLN
INTRAVENOUS | Status: DC | PRN
  Administered 2017-02-08: 08:00:00 via INTRAVENOUS

## 2017-02-08 MED ORDER — PROPOFOL 10 MG/ML IV BOLUS
INTRAVENOUS | Status: AC
  Filled 2017-02-08: qty 40

## 2017-02-08 MED ORDER — LIDOCAINE HCL (CARDIAC) 20 MG/ML IV SOLN
INTRAVENOUS | Status: DC | PRN
  Administered 2017-02-08: 100 mg via INTRAVENOUS

## 2017-02-08 MED ORDER — FENTANYL CITRATE (PF) 100 MCG/2ML IJ SOLN
INTRAMUSCULAR | Status: DC | PRN
  Administered 2017-02-08: 50 ug via INTRAVENOUS

## 2017-02-08 MED ORDER — CEPHALEXIN 500 MG PO CAPS: 500.0000 mg | ORAL_CAPSULE | Freq: Three times a day (TID) | ORAL | 0 refills | Status: DC

## 2017-02-08 MED ORDER — HYDROCODONE-ACETAMINOPHEN 5-325 MG PO TABS: 1.0000 | ORAL_TABLET | ORAL | 0 refills | Status: DC | PRN

## 2017-02-08 MED ORDER — PROPOFOL 10 MG/ML IV BOLUS
INTRAVENOUS | Status: DC | PRN
Start: 2017-02-08 — End: 2017-02-08
  Administered 2017-02-08: 150 mg via INTRAVENOUS

## 2017-02-08 MED ORDER — ONDANSETRON HCL 4 MG/2ML IJ SOLN
INTRAMUSCULAR | Status: DC | PRN
  Administered 2017-02-08: 4 mg via INTRAVENOUS

## 2017-02-08 MED ORDER — ONDANSETRON HCL 4 MG/2ML IJ SOLN: 4.0000 mg | Freq: Once | INTRAMUSCULAR | Status: DC | PRN

## 2017-02-08 MED ORDER — FENTANYL CITRATE (PF) 100 MCG/2ML IJ SOLN: 25.0000 ug | INTRAMUSCULAR | Status: DC | PRN

## 2017-02-08 MED ORDER — CEFAZOLIN SODIUM-DEXTROSE 2-4 GM/100ML-% IV SOLN
INTRAVENOUS | Status: AC
  Filled 2017-02-08: qty 100

## 2017-02-08 MED ORDER — FENTANYL CITRATE (PF) 100 MCG/2ML IJ SOLN
INTRAMUSCULAR | Status: AC
  Filled 2017-02-08: qty 2

## 2017-02-08 MED ORDER — SODIUM CHLORIDE 0.9 % IV SOLN
INTRAVENOUS | Status: DC
  Administered 2017-02-08: 07:00:00 via INTRAVENOUS

## 2017-02-08 SURGICAL SUPPLY — 18 items
BACTOSHIELD CHG 4% 4OZ (MISCELLANEOUS) ×2
GLOVE BIO SURGEON STRL SZ7.5 (GLOVE) ×3 IMPLANT
GOWN STRL REUS W/ TWL LRG LVL4 (GOWN DISPOSABLE) ×1 IMPLANT
GOWN STRL REUS W/TWL LRG LVL4 (GOWN DISPOSABLE) ×2
GOWN STRL REUS W/TWL XL LVL3 (GOWN DISPOSABLE) ×3 IMPLANT
KIT RM TURNOVER CYSTO AR (KITS) ×3 IMPLANT
PACK CYSTO AR (MISCELLANEOUS) ×3 IMPLANT
SCRUB CHG 4% DYNA-HEX 4OZ (MISCELLANEOUS) ×1 IMPLANT
SENSORWIRE 0.038 NOT ANGLED (WIRE) ×3
SET CYSTO W/LG BORE CLAMP LF (SET/KITS/TRAYS/PACK) ×3 IMPLANT
SOL .9 NS 3000ML IRR  AL (IV SOLUTION) ×2
SOL .9 NS 3000ML IRR AL (IV SOLUTION) ×1
SOL .9 NS 3000ML IRR UROMATIC (IV SOLUTION) ×1 IMPLANT
STENT URET 6FRX24 CONTOUR (STENTS) IMPLANT
STENT URET 6FRX26 CONTOUR (STENTS) ×3 IMPLANT
SURGILUBE 2OZ TUBE FLIPTOP (MISCELLANEOUS) ×3 IMPLANT
WATER STERILE IRR 1000ML POUR (IV SOLUTION) ×3 IMPLANT
WIRE SENSOR 0.038 NOT ANGLED (WIRE) ×1 IMPLANT

## 2017-02-08 NOTE — H&P (Signed)
[name] Jul 20, 1941 [id number]  Referring provider: Tamsen Roers, MD [address], [place] [address]      Chief Complaint  Patient presents with  . Pre-op Exam    Hydronephrosis with ureteral stricture    HPI: The patient is a 76 year old gentleman with a known past medical history of stage IV metastatic prostate cancer. He presents today with left hydroureteronephrosis. This was seen on both the recent CT scan and bone scan. He also has what appears to be a mass in his prostatic bed and possibly growing into his bladder. He had a radical prostatectomy in 1992. We do not have records from this procedure. The patient does not remember where this was done.   The patient is currently temporized with stent changes every 3 months for his left hydroureteronephrosis. 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   PMH:      Past Medical History  Diagnosis Date  . Diabetes mellitus without complication ([place])   . Hypertension   . Hypercholesteremia   . GERD (gastroesophageal reflux disease)   . Prostate cancer ([place])     metastatic  . Arthritis   . Heart murmur   . Aortic stenosis, moderate 01/30/2014  . Atrial flutter ([place]) 02/02/2014  . HTN (hypertension) 01/30/2014  . NSTEMI (non-ST elevated myocardial infarction) ([place]) 01/30/2014    Surgical History:       Past Surgical History  Procedure Laterality Date  . Prostatectomy  1994  . Colon resection    . Eye surgery    . Cystoscopy w/ retrogrades Left 06/08/2015    Procedure: CYSTOSCOPY WITH RETROGRADE PYELOGRAM; Surgeon: Nickie Retort, MD; Location: [facility] ORS; Service: Urology; Laterality: Left;    Home Medications:           Medication List               This list is accurate as of:09/16/15 3:21  PM.Always use your most recent med list.                     allopurinol 300 MG tablet  Commonly known as: ZYLOPRIM  Take 300 mg by mouth daily.     aspirin 81 MG chewable tablet  Chew 1 tablet (81 mg total) by mouth daily.     atorvastatin 10 MG tablet  Commonly known as: LIPITOR     carvedilol 6.25 MG tablet  Commonly known as: COREG  Take 1 tablet (6.25 mg total) by mouth 2 (two) times daily with a meal.     ferrous sulfate 325 (65 FE) MG tablet  Take 325 mg by mouth 2 (two) times daily with a meal.     lovastatin 20 MG tablet  Commonly known as: MEVACOR  Take 20 mg by mouth daily. Reported on 09/16/2015     oxybutynin 5 MG tablet  Commonly known as: DITROPAN  Take 5 mg by mouth 4 (four) times daily.     pantoprazole 40 MG tablet  Commonly known as: PROTONIX  Take 1 tablet (40 mg total) by mouth daily before breakfast.     ranitidine 150 MG tablet  Commonly known as: ZANTAC  Take 150 mg by mouth 2 (two) times daily.     traMADol 50 MG tablet  Commonly known as: ULTRAM  Take 1 tablet (50 mg total) by mouth 2 (two) times daily as needed.  verapamil 240 MG CR tablet  Commonly known as: CALAN-SR  Take 1 tablet (240 mg total) by mouth daily.     VITAMIN B 12 PO  Take 1,000 mcg by mouth daily.           Allergies: No Known Allergies  Family History:      Family History  Problem Relation Age of Onset  . Aneurysm Father   . Cancer Mother     Social History:reports that he quit smoking about 41 years ago. His smoking use included Cigarettes. He has a 37.5 pack-year smoking history. He has never used smokeless tobacco. He reports that he does not drink alcohol or use illicit drugs.  ROS: UROLOGY Frequent Urination?: No Hard to postpone urination?: No Burning/pain with urination?: No Get up at night to urinate?: Yes Leakage of urine?:  Yes Urine stream starts and stops?: No Trouble starting stream?: No Do you have to strain to urinate?: No Blood in urine?: No Urinary tract infection?: No Sexually transmitted disease?: No Injury to kidneys or bladder?: No Painful intercourse?: No Weak stream?: No Erection problems?: No Penile pain?: No  Gastrointestinal Nausea?: No Vomiting?: No Indigestion/heartburn?: No Diarrhea?: No Constipation?: No  Constitutional Fever: No Night sweats?: No Weight loss?: No Fatigue?: No  Skin Skin rash/lesions?: No Itching?: No  Eyes Blurred vision?: No Double vision?: No  Ears/Nose/Throat Sore throat?: No Sinus problems?: No  Hematologic/Lymphatic Swollen glands?: No Easy bruising?: No  Cardiovascular Leg swelling?: No Chest pain?: No  Respiratory Cough?: No Shortness of breath?: No  Endocrine Excessive thirst?: Yes  Musculoskeletal Back pain?: Yes Joint pain?: No  Neurological Headaches?: No Dizziness?: No  Psychologic Depression?: No Anxiety?: No  Physical Exam: BP 124/66 mmHg  Pulse 69  Ht 5\' 11"  (1.803 m)  Wt 214 lb 14.4 oz (97.478 kg)  BMI 29.99 kg/m2  Constitutional: Alert and oriented, No acute distress. HEENT: [place] AT, moist mucus membranes. Trachea midline, no masses. Cardiovascular: No clubbing, cyanosis, or edema. RRR Respiratory: Normal respiratory effort, no increased work of breathing. clear GI: Abdomen is soft, nontender, nondistended, no abdominal masses GU: No CVA tenderness. Skin: No rashes, bruises or suspicious lesions. Lymph: No cervical or inguinal adenopathy. Neurologic: Grossly intact, no focal deficits, moving all 4 extremities. Psychiatric: Normal mood and affect.  Laboratory Data:      Recent Labs          Lab Results  Component Value Date   WBC 10.1 08/01/2015   HGB 13.6 08/01/2015   HCT 41.4 08/01/2015   MCV 87.1 08/01/2015   PLT 183 08/01/2015            Recent Labs          Lab Results  Component Value Date   CREATININE 1.40* 08/01/2015           Recent Labs          Lab Results  Component Value Date   PSA 1.39 07/04/2015   PSA 48.22* 03/24/2015           Recent Labs          Lab Results  Component Value Date   TESTOSTERONE 228* 03/24/2015           Recent Labs          Lab Results  Component Value Date   HGBA1C 5.5 01/30/2014      Urinalysis      Labs (Brief)  Component Value Date/Time   COLORURINE YELLOW 01/13/2014 2146   APPEARANCEUR CLOUDY* 01/13/2014 2146   LABSPEC 1.017 01/13/2014 2146   PHURINE 5.0 01/13/2014 2146   GLUCOSEU Negative 05/10/2015 1335   HGBUR LARGE* 01/13/2014 2146   BILIRUBINUR Negative 05/10/2015 1335   BILIRUBINUR NEGATIVE 01/13/2014 2146   KETONESUR NEGATIVE 01/13/2014 2146   PROTEINUR 100* 01/13/2014 2146   UROBILINOGEN 0.2 01/13/2014 2146   NITRITE Negative 05/10/2015 1335   NITRITE NEGATIVE 01/13/2014 2146   LEUKOCYTESUR Negative 05/10/2015 1335   LEUKOCYTESUR NEGATIVE 01/13/2014 2146       Assessment &Plan:    1. Left Hydroureteronephrosis The patient is due for a left ureteral stent exchange.   2. Metastatic prostate cancer - Stage IV with metastases to lumbar spine, pelvic lymphadenopathy  He is under the care of Dr. Rogue Bussing on Lupron and Delton See for bone mets. We will defer further management to medical oncology.

## 2017-02-08 NOTE — Transfer of Care (Signed)
Immediate Anesthesia Transfer of Care Note  Patient: [name]  Procedure(s) Performed: Procedure(s): CYSTOSCOPY WITH STENT REPLACEMENT (Left)  Patient Location: PACU  Anesthesia Type:General  Level of Consciousness: awake, alert  and oriented  Airway & Oxygen Therapy: Patient Spontanous Breathing  Post-op Assessment: Report given to RN, Post -op Vital signs reviewed and stable and Patient moving all extremities  Post vital signs: Reviewed and stable  Last Vitals:  Vitals:   02/08/17 0612 02/08/17 0822  BP: (!) 177/96 (!) (P) 158/83  Pulse:    Resp:    Temp:  36.3 C    Last Pain:  Vitals:   02/08/17 0822  TempSrc:   PainSc: (P) Asleep         Complications: No apparent anesthesia complications

## 2017-02-08 NOTE — Op Note (Signed)
Preoperative diagnosis:  1. Left hydronephrosis 2. Stage IV metastatic prostate cancer  Postoperative diagnosis:  1. Left hydronephrosis 2. Stage IV metastatic prostate cancer  Procedure: 1. Cystoscopy 2. Left ureteral stent exchange 6 [place] by 26 cm 3. Left retrograde pyelogram with interpertation  Surgeon: Baruch Gouty, MD  Anesthesia: General  Complications: None  Intraoperative findings: The stent was confirmed to be in the correct location on fluoroscopy. Left hydronephrosis was persistent on left retrograde pyelogram.  EBL: None  Specimens: None  Drains: 6 French by 26 cm double-J left ureteral stent  Disposition: Stable to the postanesthesia care unit  Indication for procedure: The patient is a 76 y.o. male with stage IV metastatic prostate cancer with left hydroureteronephrosis presents today for left ureteral stent exchange. After reviewing the management options for treatment, the patient elected to proceed with the above surgical procedure(s). We have discussed the potential benefits and risks of the procedure, side effects of the proposed treatment, the likelihood of the patient achieving the goals of the procedure, and any potential problems that might occur during the procedure or recuperation. Informed consent has been obtained.  Description of procedure: The patient was met in the preoperative area. All risks, benefits, and indications of the procedure were described in great detail. The patient consented to the procedure. Preoperative antibiotics were given. The patient was taken to the operative theater. General anesthesia was induced per the anesthesia service. The patient was then placed in the dorsal lithotomy position and prepped and draped in the usual sterile fashion. A preoperative timeout was called. A 21 French 30 cystoscope was inserted into the patient's bladder per urethra atraumatically. The left ureteral stent was grasped flexible  graspers brought to level urethral meatus. Prior to this though, fluoroscopy was used to locate the level of the renal pelvis via stent curl. Sensor was exchanged left ureteral stent up to level the renal pelvis and the stent was removed.6 and opening catheter exchanged for the sensor wire and a retrograde pyelogram on the left side to place. This showed the patient still having persistent hydronephrosis. The open ended catheter was then exchanged for a sensor wire. The cystoscope was reassembled over the sensor wire inserted back into the patient's bladder. A 6 French by 26 cm double-J ureteral stent was then placed over the sensor wire and advanced to thelevel of the renal pelvis under fluoroscopy. The sensor wire was removed. A curl seen was seen in the patient's left renal pelvis indicating proximal correct placement on fluoroscopy. The correct distal placement of the stent was confirmed with direct position within the patient's urinary bladder. There was clear yellow urine draining from the stent further confirming correct placement. The patient's bladder was then drained. The patient was woke from anesthesia and transferred to postanesthesia care unit without apparent complication.  Plan: The patient will follow-up in 3 months for repeat stent exchange. His metastatic stage IV prostate cancer is managed via Lupron/Xgevaby medical oncology.

## 2017-02-08 NOTE — Anesthesia Post-op Follow-up Note (Cosign Needed)
Anesthesia QCDR form completed.        

## 2017-02-08 NOTE — Anesthesia Preprocedure Evaluation (Signed)
Anesthesia Evaluation  Patient identified by MRN, date of birth, ID band Patient awake    Reviewed: Allergy & Precautions, NPO status , Patient's Chart, lab work & pertinent test results  History of Anesthesia Complications Negative for: history of anesthetic complications  Airway Mallampati: III       Dental  (+) Upper Dentures, Lower Dentures   Pulmonary neg pulmonary ROS, former smoker,           Cardiovascular hypertension, Pt. on home beta blockers and Pt. on medications (-) angina+ Past MI (pt denies')  (-) CAD, (-) Cardiac Stents and (-) CABG (-) dysrhythmias + Valvular Problems/Murmurs (murmur, no tx)      Neuro/Psych negative neurological ROS  negative psych ROS   GI/Hepatic Neg liver ROS, GERD  Medicated and Controlled,  Endo/Other  diabetes, Type 2, Oral Hypoglycemic Agents  Renal/GU      Musculoskeletal   Abdominal   Peds  Hematology  (+) anemia ,   Anesthesia Other Findings Past Medical History: 01/30/2014: Aortic stenosis, moderate No date: Arthritis 02/02/2014: Atrial flutter ([facility]) No date: Diabetes mellitus without complication ([facility])     Comment: diet control No date: GERD (gastroesophageal reflux disease) No date: Heart murmur No date: Heart murmur 01/30/2014: HTN (hypertension) No date: Hypercholesteremia No date: Hypertension No date: Prostate cancer ([place])     Comment: metastatic No date: Weakness of left leg   Reproductive/Obstetrics negative OB ROS                             Anesthesia Physical  Anesthesia Plan  ASA: III  Anesthesia Plan: General   Post-op Pain Management:    Induction: Intravenous  PONV Risk Score and Plan: 1 and Ondansetron and Dexamethasone  Airway Management Planned: LMA  Additional Equipment:   Intra-op Plan:   Post-operative Plan:   Informed Consent: I have reviewed the patients History and Physical, chart, labs and  discussed the procedure including the risks, benefits and alternatives for the proposed anesthesia with the patient or authorized representative who has indicated his/her understanding and acceptance.     Plan Discussed with:   Anesthesia Plan Comments:         Anesthesia Quick Evaluation

## 2017-02-08 NOTE — Anesthesia Procedure Notes (Deleted)
Performed by: Tamala Julian, Marilyne Haseley

## 2017-02-08 NOTE — OR Nursing (Signed)
Dr Pilar Jarvis notified that patient contimued taking Aspirin may proceed to Surgery per md.

## 2017-02-08 NOTE — Discharge Instructions (Signed)

## 2017-02-08 NOTE — Anesthesia Postprocedure Evaluation (Signed)
Anesthesia Post Note  Patient: [name]  Procedure(s) Performed: Procedure(s) (LRB): CYSTOSCOPY WITH STENT REPLACEMENT (Left)  Patient location during evaluation: PACU Anesthesia Type: General Level of consciousness: awake and alert Pain management: pain level controlled Vital Signs Assessment: post-procedure vital signs reviewed and stable Respiratory status: spontaneous breathing, nonlabored ventilation, respiratory function stable and patient connected to nasal cannula oxygen Cardiovascular status: blood pressure returned to baseline and stable Postop Assessment: no signs of nausea or vomiting Anesthetic complications: no     Last Vitals:  Vitals:   02/08/17 0858 02/08/17 0905  BP: (!) 171/86 (!) 156/76  Pulse: 75 69  Resp: 14 16  Temp: 36.3 C     Last Pain:  Vitals:   02/08/17 0848  TempSrc:   PainSc: 0-No pain                 Martha Clan

## 2017-02-08 NOTE — Anesthesia Procedure Notes (Signed)
Procedure Name: LMA Insertion Date/Time: 02/08/2017 7:56 AM Performed by: Tamala Julian, William Schake Pre-anesthesia Checklist: Patient identified, Suction available, Patient being monitored, Timeout performed and Emergency Drugs available Patient Re-evaluated:Patient Re-evaluated prior to inductionOxygen Delivery Method: Circle system utilized Preoxygenation: Pre-oxygenation with 100% oxygen Intubation Type: IV induction Ventilation: Mask ventilation without difficulty LMA: LMA inserted LMA Size: 4.0 Number of attempts: 1 Placement Confirmation: positive ETCO2 and breath sounds checked- equal and bilateral Tube secured with: Tape Dental Injury: Teeth and Oropharynx as per pre-operative assessment

## 2017-02-11 ENCOUNTER — Encounter: Payer: Self-pay | Admitting: Urology

## 2017-02-21 ENCOUNTER — Ambulatory Visit: Payer: Medicare PPO | Admitting: Urology

## 2017-02-21 ENCOUNTER — Encounter: Payer: Self-pay | Admitting: Urology

## 2017-02-21 VITALS — BP 191/91 | HR 88 | Ht 71.0 in | Wt 205.4 lb

## 2017-02-21 DIAGNOSIS — N200 Calculus of kidney: Secondary | ICD-10-CM

## 2017-02-21 DIAGNOSIS — C61 Malignant neoplasm of prostate: Secondary | ICD-10-CM | POA: Diagnosis not present

## 2017-02-21 DIAGNOSIS — N1330 Unspecified hydronephrosis: Secondary | ICD-10-CM

## 2017-02-21 DIAGNOSIS — R109 Unspecified abdominal pain: Secondary | ICD-10-CM

## 2017-02-21 NOTE — Progress Notes (Signed)
02/21/2017 10:53 AM   [name] [birth year] [id number]  Referring provider: Tamsen Roers, [place], [place] [address]  Chief Complaint  Patient presents with  . Post-op Problem    stent pain-yesterday    HPI: The patient is a 76 year old gentleman with a known past medical history of stage IV metastatic prostate cancer with known bone mets and left malignant hydroureteronephrosis which is managed with a left ureteral stent who presents today with left flank pain. His left ureteral stent was last exchanged on 02/08/2017. He noted that his left flank pain started yesterday. He finds it very bothersome. He describes it as constant. Position changes can either worsen it or make it better. Bending and turning also makes it worse. It is not relieved when he lies down. He has no nausea, vomiting, fever, or chills. He did not have this issue into the last day or so. He did okay and had no pain after stent exchange approximately 2 weeks ago. Tylenol has helped with his pain.  He had a radical prostatectomy in 1992. We do not have records from this procedure. The patient does not remember where this was done.   He is under the care of the cancer center receiving Lupron and Xgeva for his Stage IVcastrate sensitive metastatic prostate cancer.  Most recent PSA in March 2018 with 0.1.  He does have a known nonobstructing right renal calculus.  PMH: Past Medical History:  Diagnosis Date  . Aortic stenosis, moderate 01/30/2014  . Arthritis   . Atrial flutter ([place]) 02/02/2014  . Diabetes mellitus without complication (HCC)    diet control  . GERD (gastroesophageal reflux disease)   . Heart murmur   . Heart murmur   . HTN (hypertension) 01/30/2014  . Hypercholesteremia   . Hypertension   . Prostate cancer ([place])    metastatic  . Weakness of left leg     Surgical History: Past Surgical History:  Procedure Laterality Date  . CATARACT EXTRACTION W/ INTRAOCULAR LENS IMPLANT  Bilateral   . COLON RESECTION    . CYSTOSCOPY W/ RETROGRADES Left 06/08/2015   Procedure: CYSTOSCOPY WITH RETROGRADE PYELOGRAM;  Surgeon: Nickie Retort, MD;  Location: [facility];  Service: Urology;  Laterality: Left;  . CYSTOSCOPY W/ URETERAL STENT PLACEMENT Left 10/05/2015   Procedure: CYSTOSCOPY WITH STENT REPLACEMENT;  Surgeon: Nickie Retort, MD;  Location: [facility];  Service: Urology;  Laterality: Left;  . CYSTOSCOPY W/ URETERAL STENT PLACEMENT Left 01/04/2016   Procedure: CYSTOSCOPY WITH STENT REPLACEMENT;  Surgeon: Nickie Retort, MD;  Location: [facility];  Service: Urology;  Laterality: Left;  . CYSTOSCOPY W/ URETERAL STENT PLACEMENT Left 04/11/2016   Procedure: CYSTOSCOPY WITH STENT REPLACEMENT;  Surgeon: Nickie Retort, MD;  Location: [facility];  Service: Urology;  Laterality: Left;  . CYSTOSCOPY W/ URETERAL STENT PLACEMENT Left 07/18/2016   Procedure: CYSTOSCOPY WITH STENT REPLACEMENT;  Surgeon: Nickie Retort, MD;  Location: [facility];  Service: Urology;  Laterality: Left;  . CYSTOSCOPY W/ URETERAL STENT PLACEMENT Left 11/02/2016   Procedure: CYSTOSCOPY WITH STENT REPLACEMENT;  Surgeon: Nickie Retort, MD;  Location: [facility];  Service: Urology;  Laterality: Left;  . CYSTOSCOPY W/ URETERAL STENT PLACEMENT Left 02/08/2017   Procedure: CYSTOSCOPY WITH STENT REPLACEMENT;  Surgeon: Nickie Retort, MD;  Location: [facility];  Service: Urology;  Laterality: Left;  . EYE SURGERY Bilateral Sept and Oct.2012   cataract  . HERNIA REPAIR    . PROSTATECTOMY  1994    Home Medications:  Allergies as of 02/21/2017   No Known Allergies     Medication List       Accurate as of 02/21/17 10:53 AM. Always use your most recent med list.          acetaminophen 500 MG tablet Commonly known as:  TYLENOL Take 1,000 mg by mouth at bedtime.   allopurinol 300 MG tablet Commonly known as:  ZYLOPRIM Take 300 mg by mouth every morning.   aspirin 81 MG tablet Take 81 mg by  mouth every morning. Reported on 01/03/2016   carvedilol 6.25 MG tablet Commonly known as:  COREG Take 1 tablet (6.25 mg total) by mouth 2 (two) times daily with a meal.   cephALEXin 500 MG capsule Commonly known as:  KEFLEX Take 1 capsule (500 mg total) by mouth 3 (three) times daily.   ergocalciferol 50000 units capsule Commonly known as:  VITAMIN D2 Take 50,000 Units by mouth once a week. Tuesday   ferrous sulfate 325 (65 FE) MG tablet Take 325 mg by mouth 2 (two) times daily with a meal.   HYDROcodone-acetaminophen 5-325 MG tablet Commonly known as:  NORCO Take 1 tablet by mouth every 4 (four) hours as needed for moderate pain.   oxybutynin 5 MG tablet Commonly known as:  DITROPAN Take 5 mg by mouth 4 (four) times daily.   pantoprazole 40 MG tablet Commonly known as:  PROTONIX Take 1 tablet (40 mg total) by mouth daily before breakfast.   ranitidine 150 MG tablet Commonly known as:  ZANTAC Take 150 mg by mouth at bedtime.   verapamil 240 MG CR tablet Commonly known as:  CALAN-SR Take 1 tablet (240 mg total) by mouth daily.   vitamin B-12 1000 MCG tablet Commonly known as:  CYANOCOBALAMIN Take 1,000 mcg by mouth daily.       Allergies: No Known Allergies  Family History: Family History  Problem Relation Age of Onset  . Aneurysm Father   . Cancer Mother        breast    Social History:  reports that he quit smoking about 43 years ago. His smoking use included Cigarettes. He has a 37.50 pack-year smoking history. He has never used smokeless tobacco. He reports that he does not drink alcohol or use drugs.  ROS: UROLOGY Frequent Urination?: Yes Hard to postpone urination?: No Burning/pain with urination?: No Get up at night to urinate?: Yes Leakage of urine?: No Urine stream starts and stops?: No Trouble starting stream?: No Do you have to strain to urinate?: No Blood in urine?: No Urinary tract infection?: No Sexually transmitted disease?: No Injury  to kidneys or bladder?: No Painful intercourse?: No Weak stream?: No Erection problems?: No Penile pain?: No  Gastrointestinal Nausea?: No Vomiting?: No Indigestion/heartburn?: No Diarrhea?: No Constipation?: Yes  Constitutional Fever: No Night sweats?: No Weight loss?: No Fatigue?: No  Skin Skin rash/lesions?: No Itching?: No  Eyes Blurred vision?: No Double vision?: No  Ears/Nose/Throat Sore throat?: No Sinus problems?: No  Hematologic/Lymphatic Swollen glands?: No Easy bruising?: Yes  Cardiovascular Leg swelling?: No Chest pain?: No  Respiratory Cough?: No Shortness of breath?: No  Endocrine Excessive thirst?: No  Musculoskeletal Back pain?: No Joint pain?: No  Neurological Headaches?: No Dizziness?: No  Psychologic Depression?: No Anxiety?: No  Physical Exam: BP (!) 191/91 (BP Location: Left Arm, Patient Position: Sitting, Cuff Size: Normal)   Pulse 88   Ht 5\' 11"  (1.803 m)   Wt 205  lb 6.4 oz (93.2 kg)   BMI 28.65 kg/m   Constitutional:  Alert and oriented, No acute distress. HEENT: [place] AT, moist mucus membranes.  Trachea midline, no masses. Cardiovascular: No clubbing, cyanosis, or edema. Respiratory: Normal respiratory effort, no increased work of breathing. GI: Abdomen is soft, nontender, nondistended, no abdominal masses GU: Left CVA tenderness.  Skin: No rashes, bruises or suspicious lesions. Lymph: No cervical or inguinal adenopathy. Neurologic: Grossly intact, no focal deficits, moving all 4 extremities. Psychiatric: Normal mood and affect.  Laboratory Data: Lab Results  Component Value Date   WBC 8.9 10/19/2016   HGB 13.6 10/19/2016   HCT 40.0 10/19/2016   MCV 88.3 10/19/2016   PLT 181 10/19/2016    Lab Results  Component Value Date   CREATININE 1.47 (H) 10/19/2016    Lab Results  Component Value Date   PSA 0.10 10/19/2016   PSA 0.02 06/28/2016   PSA 0.06 03/07/2016    Lab Results  Component Value Date    TESTOSTERONE <3 (L) 10/20/2015    Lab Results  Component Value Date   HGBA1C 5.5 01/30/2014    Urinalysis    Component Value Date/Time   COLORURINE YELLOW 01/13/2014 2146   APPEARANCEUR Clear 04/06/2016 1126   LABSPEC 1.017 01/13/2014 2146   PHURINE 5.0 01/13/2014 2146   GLUCOSEU Negative 04/06/2016 1126   HGBUR LARGE (A) 01/13/2014 2146   BILIRUBINUR Negative 04/06/2016 [place] 01/13/2014 2146   PROTEINUR Negative 04/06/2016 1126   PROTEINUR 100 (A) 01/13/2014 2146   UROBILINOGEN 0.2 01/13/2014 2146   NITRITE Negative 04/06/2016 1126   NITRITE NEGATIVE 01/13/2014 2146   LEUKOCYTESUR Negative 04/06/2016 1126    Assessment & Plan:    1. Left flank pain The patient's pain is likely musculoskeletal in origin. I have low suspicion for stent failure especially since it was just exchange 2 weeks ago. We will however obtain a renal ultrasound to ensure that this is not the case. I have recommended in the meantime ibuprofen and icing the affected area. We'll call him with a renal ultrasound results.  2. Left hydronephrosis Follow up as scheduled in September for left ureteral stent exchange in the OR.  3. Prostate cancer Continue Lupron and Xgeva per medical oncology  4. Right nonobstructing stone Continue conservative management at this point.  Return for will call w/ u/s results. f/u as scheduled for stent change in OR in September.  Nickie Retort, MD  [facility] [address] [place],  [address] [phone]

## 2017-02-23 ENCOUNTER — Emergency Department: Payer: Medicare PPO

## 2017-02-23 ENCOUNTER — Encounter: Payer: Self-pay | Admitting: Emergency Medicine

## 2017-02-23 ENCOUNTER — Emergency Department
Admission: EM | Admit: 2017-02-23 | Discharge: 2017-02-23 | Disposition: A | Discharge status: Home / Self Care | Payer: Medicare PPO | Attending: Emergency Medicine | Admitting: Emergency Medicine

## 2017-02-23 DIAGNOSIS — Z7982 Long term (current) use of aspirin: Secondary | ICD-10-CM | POA: Diagnosis not present

## 2017-02-23 DIAGNOSIS — Z87891 Personal history of nicotine dependence: Secondary | ICD-10-CM | POA: Diagnosis not present

## 2017-02-23 DIAGNOSIS — I252 Old myocardial infarction: Secondary | ICD-10-CM | POA: Diagnosis not present

## 2017-02-23 DIAGNOSIS — Z8546 Personal history of malignant neoplasm of prostate: Secondary | ICD-10-CM | POA: Diagnosis not present

## 2017-02-23 DIAGNOSIS — Z79899 Other long term (current) drug therapy: Secondary | ICD-10-CM | POA: Diagnosis not present

## 2017-02-23 DIAGNOSIS — Z960 Presence of urogenital implants: Secondary | ICD-10-CM | POA: Diagnosis not present

## 2017-02-23 DIAGNOSIS — I110 Hypertensive heart disease with heart failure: Secondary | ICD-10-CM | POA: Diagnosis not present

## 2017-02-23 DIAGNOSIS — E119 Type 2 diabetes mellitus without complications: Secondary | ICD-10-CM | POA: Diagnosis not present

## 2017-02-23 DIAGNOSIS — R109 Unspecified abdominal pain: Secondary | ICD-10-CM | POA: Diagnosis present

## 2017-02-23 DIAGNOSIS — I5042 Chronic combined systolic (congestive) and diastolic (congestive) heart failure: Secondary | ICD-10-CM | POA: Diagnosis not present

## 2017-02-23 LAB — CBC WITH DIFFERENTIAL/PLATELET
BASOS PCT: 0 %
Basophils Absolute: 0 10*3/uL (ref 0–0.1)
EOS ABS: 0.5 10*3/uL (ref 0–0.7)
Eosinophils Relative: 6 %
HCT: 39.4 % — ABNORMAL LOW (ref 40.0–52.0)
HEMOGLOBIN: 13.6 g/dL (ref 13.0–18.0)
Lymphocytes Relative: 17 %
Lymphs Abs: 1.6 10*3/uL (ref 1.0–3.6)
MCH: 30.7 pg (ref 26.0–34.0)
MCHC: 34.4 g/dL (ref 32.0–36.0)
MCV: 89.1 fL (ref 80.0–100.0)
Monocytes Absolute: 0.9 10*3/uL (ref 0.2–1.0)
Monocytes Relative: 9 %
NEUTROS PCT: 68 %
Neutro Abs: 6.6 10*3/uL — ABNORMAL HIGH (ref 1.4–6.5)
Platelets: 192 10*3/uL (ref 150–440)
RBC: 4.42 MIL/uL (ref 4.40–5.90)
RDW: 15 % — ABNORMAL HIGH (ref 11.5–14.5)
WBC: 9.6 10*3/uL (ref 3.8–10.6)

## 2017-02-23 LAB — COMPREHENSIVE METABOLIC PANEL
ALBUMIN: 4 g/dL (ref 3.5–5.0)
ALK PHOS: 71 U/L (ref 38–126)
ALT: 16 U/L — ABNORMAL LOW (ref 17–63)
ANION GAP: 8 (ref 5–15)
AST: 28 U/L (ref 15–41)
BUN: 23 mg/dL — ABNORMAL HIGH (ref 6–20)
CALCIUM: 10.4 mg/dL — AB (ref 8.9–10.3)
CO2: 24 mmol/L (ref 22–32)
Chloride: 105 mmol/L (ref 101–111)
Creatinine, Ser: 1.43 mg/dL — ABNORMAL HIGH (ref 0.61–1.24)
GFR calc Af Amer: 54 mL/min — ABNORMAL LOW (ref >60)
GFR calc non Af Amer: 46 mL/min — ABNORMAL LOW (ref >60)
GLUCOSE: 176 mg/dL — AB (ref 65–99)
Potassium: 3.7 mmol/L (ref 3.5–5.1)
SODIUM: 137 mmol/L (ref 135–145)
Total Bilirubin: 0.6 mg/dL (ref 0.3–1.2)
Total Protein: 7.5 g/dL (ref 6.5–8.1)

## 2017-02-23 LAB — URINALYSIS, COMPLETE (UACMP) WITH MICROSCOPIC
BILIRUBIN URINE: NEGATIVE
Glucose, UA: 50 mg/dL — AB
KETONES UR: NEGATIVE mg/dL
Nitrite: NEGATIVE
Protein, ur: 100 mg/dL — AB
Specific Gravity, Urine: 1.017 (ref 1.005–1.030)
pH: 6 (ref 5.0–8.0)

## 2017-02-23 MED ORDER — MORPHINE SULFATE (PF) 4 MG/ML IV SOLN
4.0000 mg | Freq: Once | INTRAVENOUS | Status: AC
  Administered 2017-02-23: 4 mg via INTRAVENOUS
  Filled 2017-02-23: qty 1

## 2017-02-23 MED ORDER — HYDROMORPHONE HCL 1 MG/ML IJ SOLN
0.5000 mg | Freq: Once | INTRAMUSCULAR | Status: AC
  Administered 2017-02-23: 0.5 mg via INTRAVENOUS
  Filled 2017-02-23: qty 1

## 2017-02-23 MED ORDER — OXYCODONE-ACETAMINOPHEN 7.5-325 MG PO TABS: 1.0000 | ORAL_TABLET | ORAL | 0 refills | Status: DC | PRN

## 2017-02-23 MED ORDER — OXYCODONE-ACETAMINOPHEN 5-325 MG PO TABS
2.0000 | ORAL_TABLET | Freq: Once | ORAL | Status: AC
  Administered 2017-02-23: 2 via ORAL
  Filled 2017-02-23: qty 2

## 2017-02-23 NOTE — ED Triage Notes (Signed)
Pt reports flank pain for about a week reports had stent placement to left kidney a week ago reports pain to left flank area has been increasing gradually, denies any hematuria, no burning sensation

## 2017-02-23 NOTE — ED Provider Notes (Signed)
[facility] Emergency Department Provider Note       Time seen: ----------------------------------------- 1:22 PM on 02/23/2017 -----------------------------------------     I have reviewed the triage vital signs and the nursing notes.   HISTORY   Chief Complaint Flank Pain and Post-op Problem    HPI [name] is a 76 y.o. male who presents to the ED for flank pain for about a week after his stent was placed in his left ureter week ago. Patient reports persistent left flank pain that is increased gradually resume as fevers, chills, dysuria or hematuria. Pain is 10 out of 10 and nothing makes it better. He was seen by his urologist was advised to take Motrin 3 times a day for the pain.   Past Medical History:  Diagnosis Date  . Aortic stenosis, moderate 01/30/2014  . Arthritis   . Atrial flutter ([place]) 02/02/2014  . Diabetes mellitus without complication (HCC)    diet control  . GERD (gastroesophageal reflux disease)   . Heart murmur   . Heart murmur   . HTN (hypertension) 01/30/2014  . Hypercholesteremia   . Hypertension   . Prostate cancer ([place])    metastatic  . Weakness of left leg     Patient Active Problem List   Diagnosis Date Noted  . 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 Atrial flutter ([place]) 02/02/2014  . Dysphagia 01/30/2014  . HTN (hypertension) 01/30/2014  . Other and unspecified hyperlipidemia 01/30/2014  . Overweight 01/30/2014  . Aortic stenosis, moderate 01/30/2014  . Chronic diastolic heart failure ([place]) 01/30/2014  . NSTEMI (non-ST elevated myocardial infarction) ([place]) 01/30/2014  . Toe ulcer ([place]) 01/30/2014  . Severe sepsis ([place]) 01/14/2014  . Acute renal failure ([place]) 01/14/2014  . Metabolic acidosis 85/46/2703  . Hyperkalemia 01/14/2014  . Thrombocytopenia ([place]) 01/14/2014  . Anemia 01/14/2014  . Aspiration pneumonia ([place]) 01/14/2014  . Diabetes mellitus ([place]) 01/14/2014    Past Surgical  History:  Procedure Laterality Date  . CATARACT EXTRACTION W/ INTRAOCULAR LENS IMPLANT Bilateral   . COLON RESECTION    . CYSTOSCOPY W/ RETROGRADES Left 06/08/2015   Procedure: CYSTOSCOPY WITH RETROGRADE PYELOGRAM;  Surgeon: Nickie Retort, MD;  Location: [facility];  Service: Urology;  Laterality: Left;  . CYSTOSCOPY W/ URETERAL STENT PLACEMENT Left 10/05/2015   Procedure: CYSTOSCOPY WITH STENT REPLACEMENT;  Surgeon: Nickie Retort, MD;  Location: [facility];  Service: Urology;  Laterality: Left;  . CYSTOSCOPY W/ URETERAL STENT PLACEMENT Left 01/04/2016   Procedure: CYSTOSCOPY WITH STENT REPLACEMENT;  Surgeon: Nickie Retort, MD;  Location: [facility];  Service: Urology;  Laterality: Left;  . CYSTOSCOPY W/ URETERAL STENT PLACEMENT Left 04/11/2016   Procedure: CYSTOSCOPY WITH STENT REPLACEMENT;  Surgeon: Nickie Retort, MD;  Location: [facility];  Service: Urology;  Laterality: Left;  . CYSTOSCOPY W/ URETERAL STENT PLACEMENT Left 07/18/2016   Procedure: CYSTOSCOPY WITH STENT REPLACEMENT;  Surgeon: Nickie Retort, MD;  Location: [facility];  Service: Urology;  Laterality: Left;  . CYSTOSCOPY W/ URETERAL STENT PLACEMENT Left 11/02/2016   Procedure: CYSTOSCOPY WITH STENT REPLACEMENT;  Surgeon: Nickie Retort, MD;  Location: [facility];  Service: Urology;  Laterality: Left;  . CYSTOSCOPY W/ URETERAL STENT PLACEMENT Left 02/08/2017   Procedure: CYSTOSCOPY WITH STENT REPLACEMENT;  Surgeon: Nickie Retort, MD;  Location: [facility];  Service: Urology;  Laterality: Left;  . EYE SURGERY Bilateral Sept and Oct.2012   cataract  . HERNIA REPAIR    . PROSTATECTOMY  1994    Allergies Patient has no known allergies.  Social History Social History  Substance Use Topics  . Smoking status: Former Smoker    Packs/day: 1.50    Years: 25.00    Types: Cigarettes    Quit date: 01/21/1974  . Smokeless tobacco: Never Used  . Alcohol use No    Review of Systems Constitutional: Negative for  fever. Eyes: Negative for vision changes ENT:  Negative for congestion, sore throat Cardiovascular: Negative for chest pain. Respiratory: Negative for shortness of breath. Gastrointestinal: Positive for flank pain Genitourinary: Negative for dysuria. Musculoskeletal: Negative for back pain. Skin: Negative for rash. Neurological: Negative for headaches, focal weakness or numbness.  All systems negative/normal/unremarkable except as stated in the HPI  ____________________________________________   PHYSICAL EXAM:  VITAL SIGNS: ED Triage Vitals  Enc Vitals Group     BP 02/23/17 1249 (!) 157/89     Pulse Rate 02/23/17 1249 73     Resp 02/23/17 1249 18     Temp 02/23/17 1249 98.1 F (36.7 C)     Temp Source 02/23/17 1249 Oral     SpO2 02/23/17 1249 97 %     Weight 02/23/17 1250 200 lb (90.7 kg)     Height 02/23/17 1250 5\' 11"  (1.803 m)     Head Circumference --      Peak Flow --      Pain Score 02/23/17 1257 10     Pain Loc --      Pain Edu? --      Excl. in [place]? --     Constitutional: Alert and oriented. Well appearing and in no distress. Eyes: Conjunctivae are normal. Normal extraocular movements. ENT   Head: Normocephalic and atraumatic.   Nose: No congestion/rhinnorhea.   Mouth/Throat: Mucous membranes are moist.   Neck: No stridor. Cardiovascular: Normal rate, regular rhythm. No murmurs, rubs, or gallops. Respiratory: Normal respiratory effort without tachypnea nor retractions. Breath sounds are clear and equal bilaterally. No wheezes/rales/rhonchi. Gastrointestinal: Left flank tenderness, no rebound or guarding. Normal bowel sounds. Musculoskeletal: Nontender with normal range of motion in extremities. No lower extremity tenderness nor edema. Neurologic:  Normal speech and language. No gross focal neurologic deficits are appreciated.  Skin:  Skin is warm, dry and intact. No rash noted. Psychiatric: Mood and affect are normal. Speech and behavior are  normal.  ____________________________________________  ED COURSE:  Pertinent labs & imaging results that were available during my care of the patient were reviewed by me and considered in my medical decision making (see chart for details). Patient presents for flank pain status post ureteral stent placement, we will assess with labs and imaging as indicated.   Procedures ____________________________________________   LABS (pertinent positives/negatives)  Labs Reviewed  URINALYSIS, COMPLETE (UACMP) WITH MICROSCOPIC - Abnormal; Notable for the following:       Result Value   Color, Urine YELLOW (*)    APPearance CLEAR (*)    Glucose, UA 50 (*)    Hgb urine dipstick MODERATE (*)    Protein, ur 100 (*)    Leukocytes, UA SMALL (*)    Bacteria, UA RARE (*)    Squamous Epithelial / LPF 0-5 (*)    All other components within normal limits  CBC WITH DIFFERENTIAL/PLATELET - Abnormal; Notable for the following:    HCT 39.4 (*)    RDW 15.0 (*)    Neutro Abs 6.6 (*)    All other components within normal limits  COMPREHENSIVE METABOLIC PANEL - Abnormal;  Notable for the following:    Glucose, Bld 176 (*)    BUN 23 (*)    Creatinine, Ser 1.43 (*)    Calcium 10.4 (*)    ALT 16 (*)    GFR calc non Af Amer 46 (*)    GFR calc Af Amer 54 (*)    All other components within normal limits  URINE CULTURE    RADIOLOGY  Renal ultrasound IMPRESSION: Focal area of decreased echogenicity along the posterior wall of the urinary bladder measuring 2.9 x 2.9 x 0.9 cm. This area does not move. A small mass arising from the posterior wall of the urinary bladder must be of concern given this appearance. This finding may warrant direct visualization to further assess. In this regard, urologic consultation advised.  Double-J stent on left. Cysts in each kidney. Nonobstructing calculus right kidney. No obstructing focus in either kidney. ____________________________________________  FINAL  ASSESSMENT AND PLAN  Flank pain, ureteral stent, Metastatic prostate cancer  Plan: Patient's labs and imaging were dictated above. Patient had presented for flank pain thought to be secondary to recent ureteral stent placement. I discussed with urology on-call and it appears that the stent is in place correctly. I have sent a urine culture but the urine appears as expected with a ureteral stent in place. He's been given pain medicine and he has close outpatient follow-up with urology and oncology. I did offer further testing to assess for further metastatic disease to his bones the patient states he has follow-up for this in several days.   Earleen Newport, MD   Note: This note was generated in part or whole with voice recognition software. Voice recognition is usually quite accurate but there are transcription errors that can and very often do occur. I apologize for any typographical errors that were not detected and corrected.     Earleen Newport, MD 02/23/17 [phone]

## 2017-02-25 ENCOUNTER — Telehealth: Payer: Self-pay | Admitting: Radiology

## 2017-02-25 ENCOUNTER — Other Ambulatory Visit: Payer: Self-pay

## 2017-02-25 ENCOUNTER — Inpatient Hospital Stay: Payer: Medicare PPO | Attending: Internal Medicine

## 2017-02-25 DIAGNOSIS — Z79818 Long term (current) use of other agents affecting estrogen receptors and estrogen levels: Secondary | ICD-10-CM | POA: Diagnosis not present

## 2017-02-25 DIAGNOSIS — E119 Type 2 diabetes mellitus without complications: Secondary | ICD-10-CM | POA: Insufficient documentation

## 2017-02-25 DIAGNOSIS — I350 Nonrheumatic aortic (valve) stenosis: Secondary | ICD-10-CM | POA: Diagnosis not present

## 2017-02-25 DIAGNOSIS — N329 Bladder disorder, unspecified: Secondary | ICD-10-CM | POA: Insufficient documentation

## 2017-02-25 DIAGNOSIS — Z87891 Personal history of nicotine dependence: Secondary | ICD-10-CM | POA: Insufficient documentation

## 2017-02-25 DIAGNOSIS — C7951 Secondary malignant neoplasm of bone: Principal | ICD-10-CM

## 2017-02-25 DIAGNOSIS — Z79899 Other long term (current) drug therapy: Secondary | ICD-10-CM | POA: Diagnosis not present

## 2017-02-25 DIAGNOSIS — Z803 Family history of malignant neoplasm of breast: Secondary | ICD-10-CM | POA: Insufficient documentation

## 2017-02-25 DIAGNOSIS — I10 Essential (primary) hypertension: Secondary | ICD-10-CM | POA: Insufficient documentation

## 2017-02-25 DIAGNOSIS — C61 Malignant neoplasm of prostate: Secondary | ICD-10-CM

## 2017-02-25 DIAGNOSIS — K219 Gastro-esophageal reflux disease without esophagitis: Secondary | ICD-10-CM | POA: Diagnosis not present

## 2017-02-25 DIAGNOSIS — M25552 Pain in left hip: Secondary | ICD-10-CM | POA: Insufficient documentation

## 2017-02-25 DIAGNOSIS — Z7982 Long term (current) use of aspirin: Secondary | ICD-10-CM | POA: Diagnosis not present

## 2017-02-25 DIAGNOSIS — I4892 Unspecified atrial flutter: Secondary | ICD-10-CM | POA: Insufficient documentation

## 2017-02-25 DIAGNOSIS — E7800 Pure hypercholesterolemia, unspecified: Secondary | ICD-10-CM | POA: Insufficient documentation

## 2017-02-25 LAB — CBC WITH DIFFERENTIAL/PLATELET
BASOS PCT: 2 %
Basophils Absolute: 0.2 10*3/uL — ABNORMAL HIGH (ref 0–0.1)
EOS ABS: 0.7 10*3/uL (ref 0–0.7)
EOS PCT: 6 %
HCT: 38.8 % — ABNORMAL LOW (ref 40.0–52.0)
HEMOGLOBIN: 13.3 g/dL (ref 13.0–18.0)
Lymphocytes Relative: 18 %
Lymphs Abs: 1.9 10*3/uL (ref 1.0–3.6)
MCH: 30.5 pg (ref 26.0–34.0)
MCHC: 34.2 g/dL (ref 32.0–36.0)
MCV: 89.1 fL (ref 80.0–100.0)
Monocytes Absolute: 1.1 10*3/uL — ABNORMAL HIGH (ref 0.2–1.0)
Monocytes Relative: 10 %
NEUTROS PCT: 64 %
Neutro Abs: 6.9 10*3/uL — ABNORMAL HIGH (ref 1.4–6.5)
PLATELETS: 212 10*3/uL (ref 150–440)
RBC: 4.36 MIL/uL — AB (ref 4.40–5.90)
RDW: 15.1 % — ABNORMAL HIGH (ref 11.5–14.5)
WBC: 10.7 10*3/uL — AB (ref 3.8–10.6)

## 2017-02-25 LAB — COMPREHENSIVE METABOLIC PANEL
ALBUMIN: 3.9 g/dL (ref 3.5–5.0)
ALK PHOS: 65 U/L (ref 38–126)
ALT: 15 U/L — AB (ref 17–63)
ANION GAP: 7 (ref 5–15)
AST: 21 U/L (ref 15–41)
BUN: 29 mg/dL — ABNORMAL HIGH (ref 6–20)
CALCIUM: 10.2 mg/dL (ref 8.9–10.3)
CHLORIDE: 104 mmol/L (ref 101–111)
CO2: 25 mmol/L (ref 22–32)
Creatinine, Ser: 1.31 mg/dL — ABNORMAL HIGH (ref 0.61–1.24)
GFR calc non Af Amer: 52 mL/min — ABNORMAL LOW (ref >60)
GFR, EST AFRICAN AMERICAN: 60 mL/min — AB (ref >60)
GLUCOSE: 138 mg/dL — AB (ref 65–99)
Potassium: 4.2 mmol/L (ref 3.5–5.1)
SODIUM: 136 mmol/L (ref 135–145)
Total Bilirubin: 0.3 mg/dL (ref 0.3–1.2)
Total Protein: 7.3 g/dL (ref 6.5–8.1)

## 2017-02-25 LAB — URINE CULTURE: SPECIAL REQUESTS: NORMAL

## 2017-02-25 LAB — PSA: Prostatic Specific Antigen: 0.39 ng/mL (ref 0.00–4.00)

## 2017-02-25 NOTE — Telephone Encounter (Signed)
Pt's wife, [name], called stating pt was seen in the ER over the weekend & RUS was done that showed no reason for pt's flank pain. Pt continues to have left flank pain. Please advise.

## 2017-02-26 ENCOUNTER — Telehealth: Payer: Self-pay | Admitting: Urology

## 2017-02-26 NOTE — Telephone Encounter (Signed)
Spoke with patient and he agreed and had no further questions.  Sharyn Lull

## 2017-02-26 NOTE — Telephone Encounter (Signed)
Nickie Retort, MD  Ranell Patrick, RN          Patient's flank pain is not urologic in origin. Recommend motrin. If that does not help, he should contact his PCP.    [facility] notifying pt of the above.

## 2017-02-27 ENCOUNTER — Inpatient Hospital Stay (HOSPITAL_BASED_OUTPATIENT_CLINIC_OR_DEPARTMENT_OTHER): Payer: Medicare PPO | Admitting: Internal Medicine

## 2017-02-27 ENCOUNTER — Inpatient Hospital Stay: Payer: Medicare PPO

## 2017-02-27 VITALS — BP 120/80 | HR 69 | Temp 96.7°F | Resp 20 | Ht 71.0 in | Wt 204.8 lb

## 2017-02-27 DIAGNOSIS — N329 Bladder disorder, unspecified: Secondary | ICD-10-CM | POA: Diagnosis not present

## 2017-02-27 DIAGNOSIS — Z87891 Personal history of nicotine dependence: Secondary | ICD-10-CM | POA: Diagnosis not present

## 2017-02-27 DIAGNOSIS — E119 Type 2 diabetes mellitus without complications: Secondary | ICD-10-CM

## 2017-02-27 DIAGNOSIS — I4892 Unspecified atrial flutter: Secondary | ICD-10-CM | POA: Diagnosis not present

## 2017-02-27 DIAGNOSIS — I350 Nonrheumatic aortic (valve) stenosis: Secondary | ICD-10-CM | POA: Diagnosis not present

## 2017-02-27 DIAGNOSIS — C61 Malignant neoplasm of prostate: Secondary | ICD-10-CM | POA: Diagnosis not present

## 2017-02-27 DIAGNOSIS — Z79899 Other long term (current) drug therapy: Secondary | ICD-10-CM

## 2017-02-27 DIAGNOSIS — Z803 Family history of malignant neoplasm of breast: Secondary | ICD-10-CM

## 2017-02-27 DIAGNOSIS — Z7982 Long term (current) use of aspirin: Secondary | ICD-10-CM

## 2017-02-27 DIAGNOSIS — I10 Essential (primary) hypertension: Secondary | ICD-10-CM

## 2017-02-27 DIAGNOSIS — C7951 Secondary malignant neoplasm of bone: Secondary | ICD-10-CM | POA: Diagnosis not present

## 2017-02-27 DIAGNOSIS — K219 Gastro-esophageal reflux disease without esophagitis: Secondary | ICD-10-CM

## 2017-02-27 DIAGNOSIS — E7800 Pure hypercholesterolemia, unspecified: Secondary | ICD-10-CM

## 2017-02-27 DIAGNOSIS — Z79818 Long term (current) use of other agents affecting estrogen receptors and estrogen levels: Secondary | ICD-10-CM

## 2017-02-27 MED ORDER — PEGFILGRASTIM 6 MG/0.6ML ~~LOC~~ PSKT: 6.0000 mg | PREFILLED_SYRINGE | Freq: Once | SUBCUTANEOUS | Status: DC

## 2017-02-27 MED ORDER — OXYCODONE-ACETAMINOPHEN 7.5-325 MG PO TABS: 1.0000 | ORAL_TABLET | Freq: Four times a day (QID) | ORAL | 0 refills | Status: DC | PRN

## 2017-02-27 MED ORDER — SODIUM CHLORIDE 0.9 % IV SOLN
Freq: Once | INTRAVENOUS | Status: AC
  Administered 2017-02-27: 15:00:00 via INTRAVENOUS
  Filled 2017-02-27: qty 1000

## 2017-02-27 MED ORDER — MORPHINE SULFATE (PF) 2 MG/ML IV SOLN
2.0000 mg | Freq: Once | INTRAVENOUS | Status: AC
  Administered 2017-02-27: 2 mg via INTRAVENOUS

## 2017-02-27 MED ORDER — DENOSUMAB 120 MG/1.7ML ~~LOC~~ SOLN
120.0000 mg | Freq: Once | SUBCUTANEOUS | Status: AC
  Administered 2017-02-27: 120 mg via SUBCUTANEOUS

## 2017-02-27 MED ORDER — MORPHINE SULFATE (PF) 2 MG/ML IV SOLN: 2.0000 mg | Freq: Once | INTRAVENOUS | Status: DC

## 2017-02-27 MED ORDER — LEUPROLIDE ACETATE (4 MONTH) 30 MG IM KIT
30.0000 mg | PACK | Freq: Once | INTRAMUSCULAR | Status: AC
  Administered 2017-02-27: 30 mg via INTRAMUSCULAR

## 2017-02-27 NOTE — Assessment & Plan Note (Addendum)
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 Last psa July 2018- 0.3/which is stable. However given the worsening left hip pain-question progression  # left hip pain- ? Etiology. Metastatic disease to the bone versus others. Recommend plain x-ray of the left hip today; also bone scan ASAP. A prescription for Percocet given; and also given IV morphine in the clinic today.  # Bladder mass- on recent ER [place]- will review with Dr.Budzyn.   # Left ureteral stent/hydronephrosis- likely from pelvic adenopathy from his malignancy/ CKD- stage III- creat 1.3. Reviewed the labs.   # Patient follow-up with me few days after the bone scan/next plan of care.

## 2017-02-27 NOTE — Progress Notes (Signed)
[place] OFFICE PROGRESS NOTE  Patient Care Team: Tamsen Roers, MD as PCP - General (Family Medicine)   SUMMARY OF ONCOLOGIC HISTORY:  Oncology History   # AUG 2016- METASTATIC PROSTATE CA/ STAGE IV; Castrate sensitive [PSA- 48] ; mets- lumbar spine [s/p pal RT to Aug 2016; Dr.Crystal] /Pelvic LN; 1994- Prostate CA s/p Surgery [facility]]; Lupron q4 M; MARCH 2017- Bone scan- L4/Right pubic rami uptake; PSA- 0.1/testosterone-castrate [<3]  # Bone lesions on denosumab;DEC 2016- Recm q 59M  # Left hydronephrosis s/p stenting     Prostate cancer metastatic to bone ([place])   04/11/2015 Initial Diagnosis    Prostate cancer metastatic to bone [facility])       INTERVAL HISTORY:  A very pleasant 76 year old male patient with above history of prostate cancer stage IV hormone sensitive currently on Lupron every 4 months is here for follow-up.   Patient states that he noted to have worsening pain in his left hip in approximately 10 days. This has been progressively getting worse. He was recently seen in the emergency room- had ultrasound of the kidney.    Patient states the pain is aggravated by movement. The pain is 10 on a scale of 10 radiating to the left hip/leg. He was also evaluated by urology recently.    Interim he was evaluated by dentistry- no concerns for his jaw pain.   He is not losing any weight. No hot flashes. Denies any worsening back pain. No chest pain or shortness of breath or cough.   REVIEW OF SYSTEMS:  A complete 10 point review of system is done which is negative except mentioned above/history of present illness.   PAST MEDICAL HISTORY :  Past Medical History:  Diagnosis Date  . Aortic stenosis, moderate 01/30/2014  . Arthritis   . Atrial flutter ([place]) 02/02/2014  . Diabetes mellitus without complication (HCC)    diet control  . GERD (gastroesophageal reflux disease)   . Heart murmur   . Heart murmur   . HTN (hypertension) 01/30/2014  .  Hypercholesteremia   . Hypertension   . Prostate cancer ([place])    metastatic  . Weakness of left leg     PAST SURGICAL HISTORY :   Past Surgical History:  Procedure Laterality Date  . CATARACT EXTRACTION W/ INTRAOCULAR LENS IMPLANT Bilateral   . COLON RESECTION    . CYSTOSCOPY W/ RETROGRADES Left 06/08/2015   Procedure: CYSTOSCOPY WITH RETROGRADE PYELOGRAM;  Surgeon: Nickie Retort, MD;  Location: [facility] ORS;  Service: Urology;  Laterality: Left;  . CYSTOSCOPY W/ URETERAL STENT PLACEMENT Left 10/05/2015   Procedure: CYSTOSCOPY WITH STENT REPLACEMENT;  Surgeon: Nickie Retort, MD;  Location: [facility] ORS;  Service: Urology;  Laterality: Left;  . CYSTOSCOPY W/ URETERAL STENT PLACEMENT Left 01/04/2016   Procedure: CYSTOSCOPY WITH STENT REPLACEMENT;  Surgeon: Nickie Retort, MD;  Location: [facility] ORS;  Service: Urology;  Laterality: Left;  . CYSTOSCOPY W/ URETERAL STENT PLACEMENT Left 04/11/2016   Procedure: CYSTOSCOPY WITH STENT REPLACEMENT;  Surgeon: Nickie Retort, MD;  Location: [facility] ORS;  Service: Urology;  Laterality: Left;  . CYSTOSCOPY W/ URETERAL STENT PLACEMENT Left 07/18/2016   Procedure: CYSTOSCOPY WITH STENT REPLACEMENT;  Surgeon: Nickie Retort, MD;  Location: [facility] ORS;  Service: Urology;  Laterality: Left;  . CYSTOSCOPY W/ URETERAL STENT PLACEMENT Left 11/02/2016   Procedure: CYSTOSCOPY WITH STENT REPLACEMENT;  Surgeon: Nickie Retort, MD;  Location: [facility] ORS;  Service: Urology;  Laterality: Left;  . CYSTOSCOPY W/  URETERAL STENT PLACEMENT Left 02/08/2017   Procedure: CYSTOSCOPY WITH STENT REPLACEMENT;  Surgeon: Nickie Retort, MD;  Location: [facility] ORS;  Service: Urology;  Laterality: Left;  . EYE SURGERY Bilateral Sept and Oct.2012   cataract  . HERNIA REPAIR    . PROSTATECTOMY  1994    FAMILY HISTORY :   Family History  Problem Relation Age of Onset  . Aneurysm Father   . Cancer Mother        breast    SOCIAL HISTORY:   Social History  Substance Use  Topics  . Smoking status: Former Smoker    Packs/day: 1.50    Years: 25.00    Types: Cigarettes    Quit date: 01/21/1974  . Smokeless tobacco: Never Used  . Alcohol use No    ALLERGIES:  has No Known Allergies.  MEDICATIONS:  Current Outpatient Prescriptions  Medication Sig Dispense Refill  . allopurinol (ZYLOPRIM) 300 MG tablet Take 300 mg by mouth every morning.     Marland Kitchen aspirin 81 MG tablet Take 81 mg by mouth every morning. Reported on 01/03/2016    . carvedilol (COREG) 6.25 MG tablet Take 1 tablet (6.25 mg total) by mouth 2 (two) times daily with a meal. 60 tablet 2  . ergocalciferol (VITAMIN D2) 50000 units capsule Take 50,000 Units by mouth once a week. Tuesday     . ferrous sulfate 325 (65 FE) MG tablet Take 325 mg by mouth 2 (two) times daily with a meal.     . ibuprofen (ADVIL,MOTRIN) 200 MG tablet Take 200 mg by mouth every 6 (six) hours as needed.    Marland Kitchen oxybutynin (DITROPAN) 5 MG tablet Take 5 mg by mouth 4 (four) times daily.    . pantoprazole (PROTONIX) 40 MG tablet Take 1 tablet (40 mg total) by mouth daily before breakfast. 30 tablet 2  . ranitidine (ZANTAC) 150 MG tablet Take 150 mg by mouth at bedtime.     . verapamil (CALAN-SR) 240 MG CR tablet Take 1 tablet (240 mg total) by mouth daily. (Patient taking differently: Take 240 mg by mouth 2 (two) times daily. ) 30 tablet 2  . vitamin B-12 (CYANOCOBALAMIN) 1000 MCG tablet Take 1,000 mcg by mouth daily.    Marland Kitchen acetaminophen (TYLENOL) 500 MG tablet Take 1,000 mg by mouth at bedtime.    Marland Kitchen oxyCODONE-acetaminophen (PERCOCET) 7.5-325 MG tablet Take 1 tablet by mouth every 6 (six) hours as needed for severe pain. 40 tablet 0   Current Facility-Administered Medications  Medication Dose Route Frequency Provider Last Rate Last Dose  . morphine 2 MG/ML injection 2 mg  2 mg Intravenous Once Cammie Sickle, MD       Facility-Administered Medications Ordered in Other Visits  Medication Dose Route Frequency Provider Last Rate Last  Dose  . 0.9 %  sodium chloride infusion   Intravenous Continuous Wylene Simmer, MD        PHYSICAL EXAMINATION: ECOG PERFORMANCE STATUS: 0 - Asymptomatic  BP 120/80 (BP Location: Left Arm, Patient Position: Sitting)   Pulse 69   Temp (!) 96.7 F (35.9 C) (Tympanic)   Resp 20   Ht 5\' 11"  (1.803 m)   Wt 204 lb 12.8 oz (92.9 kg)   BMI 28.56 kg/m   Filed Weights   02/27/17 1407  Weight: 204 lb 12.8 oz (92.9 kg)    GENERAL: Well-nourished well-developed; Alert, no distress and comfortable.   Accompanied by his wife. Patient is in a wheelchair because of left hip pain.  EYES: no pallor or icterus OROPHARYNX: no thrush or ulceration; dentures. NECK: supple, no masses felt LYMPH:  no palpable lymphadenopathy in the cervical, axillary or inguinal regions LUNGS: clear to auscultation and  No wheeze or crackles HEART/CVS: regular rate & rhythm and no murmurs; No lower extremity edema ABDOMEN:abdomen soft, non-tender and normal bowel sounds Musculoskeletal:no cyanosis of digits and no clubbing  PSYCH: alert & oriented x 3 with fluent speech NEURO: no focal motor/sensory deficits SKIN:  no rashes or significant lesions  LABORATORY DATA:  I have reviewed the data as listed    Component Value Date/Time   NA 136 02/25/2017 1110   K 4.2 02/25/2017 1110   CL 104 02/25/2017 1110   CO2 25 02/25/2017 1110   GLUCOSE 138 (H) 02/25/2017 1110   BUN 29 (H) 02/25/2017 1110   CREATININE 1.31 (H) 02/25/2017 1110   CALCIUM 10.2 02/25/2017 1110   PROT 7.3 02/25/2017 1110   ALBUMIN 3.9 02/25/2017 1110   AST 21 02/25/2017 1110   ALT 15 (L) 02/25/2017 1110   ALKPHOS 65 02/25/2017 1110   BILITOT 0.3 02/25/2017 1110   GFRNONAA 52 (L) 02/25/2017 1110   GFRAA 60 (L) 02/25/2017 1110    No results found for: SPEP, UPEP  Lab Results  Component Value Date   WBC 10.7 (H) 02/25/2017   NEUTROABS 6.9 (H) 02/25/2017   HGB 13.3 02/25/2017   HCT 38.8 (L) 02/25/2017   MCV 89.1 02/25/2017   PLT 212  02/25/2017      Chemistry      Component Value Date/Time   NA 136 02/25/2017 1110   K 4.2 02/25/2017 1110   CL 104 02/25/2017 1110   CO2 25 02/25/2017 1110   BUN 29 (H) 02/25/2017 1110   CREATININE 1.31 (H) 02/25/2017 1110      Component Value Date/Time   CALCIUM 10.2 02/25/2017 1110   ALKPHOS 65 02/25/2017 1110   AST 21 02/25/2017 1110   ALT 15 (L) 02/25/2017 1110   BILITOT 0.3 02/25/2017 1110      Results for OGDEN, HANDLIN (MRN [id number]) as of 10/26/2016 13:49  Ref. Range 11/21/2007 11:15 03/24/2015 12:45 03/31/2015 15:21 07/04/2015 13:22 10/20/2015 14:45 11/21/2015 13:31 03/07/2016 14:55 06/28/2016 13:40 10/19/2016 13:35  PSA Latest Ref Range: 0.00 - 4.00 ng/mL  48.22 (H) 48.3 (H) 1.39 0.15 0.12 0.06 0.02 0.10     ASSESSMENT & PLAN:   Prostate cancer metastatic to bone [facility]) # Metastatic castrate sensitive prostate cancer- currently on Lupron/X-geva every 4 months. Last psa July 2018- 0.3/which is stable. However given the worsening left hip pain-question progression  # left hip pain- ? Etiology. Metastatic disease to the bone versus others. Recommend plain x-ray of the left hip today; also bone scan ASAP. A prescription for Percocet given; and also given IV morphine in the clinic today.  # Bladder mass- on recent ER [place]- will review with Dr.Budzyn.   # Left ureteral stent/hydronephrosis- likely from pelvic adenopathy from his malignancy/ CKD- stage III- creat 1.3. Reviewed the labs.   # Patient follow-up with me few days after the bone scan/next plan of care.     Cammie Sickle, MD 02/27/2017 5:55 PM

## 2017-03-06 ENCOUNTER — Ambulatory Visit
Admission: RE | Admit: 2017-03-06 | Discharge: 2017-03-06 | Disposition: A | Discharge status: Home / Self Care | Payer: Medicare PPO | Source: Ambulatory Visit | Attending: Internal Medicine | Admitting: Internal Medicine

## 2017-03-06 DIAGNOSIS — C61 Malignant neoplasm of prostate: Secondary | ICD-10-CM | POA: Diagnosis not present

## 2017-03-06 DIAGNOSIS — C7951 Secondary malignant neoplasm of bone: Principal | ICD-10-CM

## 2017-03-06 DIAGNOSIS — M25552 Pain in left hip: Secondary | ICD-10-CM | POA: Diagnosis not present

## 2017-03-08 ENCOUNTER — Ambulatory Visit
Admission: RE | Admit: 2017-03-08 | Discharge: 2017-03-08 | Disposition: A | Discharge status: Home / Self Care | Payer: Medicare PPO | Source: Ambulatory Visit | Attending: Internal Medicine | Admitting: Internal Medicine

## 2017-03-08 ENCOUNTER — Encounter
Admission: RE | Admit: 2017-03-08 | Discharge: 2017-03-08 | Disposition: A | Discharge status: Home / Self Care | Payer: Medicare PPO | Source: Ambulatory Visit | Attending: Internal Medicine | Admitting: Internal Medicine

## 2017-03-08 DIAGNOSIS — R93422 Abnormal radiologic findings on diagnostic imaging of left kidney: Secondary | ICD-10-CM | POA: Insufficient documentation

## 2017-03-08 DIAGNOSIS — C61 Malignant neoplasm of prostate: Secondary | ICD-10-CM | POA: Diagnosis not present

## 2017-03-08 DIAGNOSIS — C7951 Secondary malignant neoplasm of bone: Secondary | ICD-10-CM | POA: Diagnosis present

## 2017-03-08 DIAGNOSIS — M129 Arthropathy, unspecified: Secondary | ICD-10-CM | POA: Diagnosis not present

## 2017-03-08 MED ORDER — TECHNETIUM TC 99M MEDRONATE IV KIT
25.0000 | PACK | Freq: Once | INTRAVENOUS | Status: AC | PRN
  Administered 2017-03-08: 22.85 via INTRAVENOUS

## 2017-03-11 ENCOUNTER — Inpatient Hospital Stay (HOSPITAL_BASED_OUTPATIENT_CLINIC_OR_DEPARTMENT_OTHER): Payer: Medicare PPO | Admitting: Internal Medicine

## 2017-03-11 VITALS — BP 149/81 | HR 69 | Temp 97.6°F | Resp 20 | Ht 71.0 in | Wt 204.4 lb

## 2017-03-11 DIAGNOSIS — K219 Gastro-esophageal reflux disease without esophagitis: Secondary | ICD-10-CM | POA: Diagnosis not present

## 2017-03-11 DIAGNOSIS — I10 Essential (primary) hypertension: Secondary | ICD-10-CM

## 2017-03-11 DIAGNOSIS — E119 Type 2 diabetes mellitus without complications: Secondary | ICD-10-CM

## 2017-03-11 DIAGNOSIS — E7800 Pure hypercholesterolemia, unspecified: Secondary | ICD-10-CM

## 2017-03-11 DIAGNOSIS — C7951 Secondary malignant neoplasm of bone: Secondary | ICD-10-CM

## 2017-03-11 DIAGNOSIS — Z7982 Long term (current) use of aspirin: Secondary | ICD-10-CM

## 2017-03-11 DIAGNOSIS — N329 Bladder disorder, unspecified: Secondary | ICD-10-CM | POA: Diagnosis not present

## 2017-03-11 DIAGNOSIS — M25552 Pain in left hip: Secondary | ICD-10-CM | POA: Diagnosis not present

## 2017-03-11 DIAGNOSIS — Z87891 Personal history of nicotine dependence: Secondary | ICD-10-CM

## 2017-03-11 DIAGNOSIS — Z79899 Other long term (current) drug therapy: Secondary | ICD-10-CM

## 2017-03-11 DIAGNOSIS — I350 Nonrheumatic aortic (valve) stenosis: Secondary | ICD-10-CM | POA: Diagnosis not present

## 2017-03-11 DIAGNOSIS — Z803 Family history of malignant neoplasm of breast: Secondary | ICD-10-CM

## 2017-03-11 DIAGNOSIS — Z79818 Long term (current) use of other agents affecting estrogen receptors and estrogen levels: Secondary | ICD-10-CM | POA: Diagnosis not present

## 2017-03-11 DIAGNOSIS — I4892 Unspecified atrial flutter: Secondary | ICD-10-CM | POA: Diagnosis not present

## 2017-03-11 DIAGNOSIS — C61 Malignant neoplasm of prostate: Secondary | ICD-10-CM | POA: Diagnosis not present

## 2017-03-11 MED ORDER — OXYCODONE-ACETAMINOPHEN 7.5-325 MG PO TABS: 1.0000 | ORAL_TABLET | Freq: Four times a day (QID) | ORAL | 0 refills | Status: DC | PRN

## 2017-03-11 NOTE — Progress Notes (Signed)
[name] OFFICE PROGRESS NOTE  Patient Care Team: Tamsen Roers, MD as PCP - General (Family Medicine)   SUMMARY OF ONCOLOGIC HISTORY:  Oncology History   # AUG 2016- METASTATIC PROSTATE CA/ STAGE IV; Castrate sensitive [PSA- 48] ; mets- lumbar spine [s/p pal RT to Aug 2016; Dr.Crystal] /Pelvic LN; 1994- Prostate CA s/p Surgery [facility]]; Lupron q4 M; MARCH 2017- Bone scan- L4/Right pubic rami uptake; PSA- 0.1/testosterone-castrate [<3]  # Bone lesions on denosumab;DEC 2016- Recm q 67M  # Left hydronephrosis s/p stenting     Prostate cancer metastatic to bone ([place])   04/11/2015 Initial Diagnosis    Prostate cancer metastatic to bone [facility])       INTERVAL HISTORY:  A very pleasant 76 year old male patient with above history of prostate cancer stage IV hormone sensitive currently on Lupron every 4 months is here for follow-up/ And review the results of the bone scan.  Patient continues to have pain in his left hip is not getting any better; progressively getting worse. He is on Percocet every 6-8 hours. This is not really helping him.  Patient states the pain is aggravated by movement. The pain is 10 on a scale of 10 radiating to the left hip/leg.   He is not losing any weight. No hot flashes. Denies any worsening back pain. No chest pain or shortness of breath or cough.   REVIEW OF SYSTEMS:  A complete 10 point review of system is done which is negative except mentioned above/history of present illness.   PAST MEDICAL HISTORY :  Past Medical History:  Diagnosis Date  . Aortic stenosis, moderate 01/30/2014  . Arthritis   . Atrial flutter ([place]) 02/02/2014  . Diabetes mellitus without complication (HCC)    diet control  . GERD (gastroesophageal reflux disease)   . Heart murmur   . Heart murmur   . HTN (hypertension) 01/30/2014  . Hypercholesteremia   . Hypertension   . Prostate cancer ([place])    metastatic  . Weakness of left leg     PAST SURGICAL  HISTORY :   Past Surgical History:  Procedure Laterality Date  . CATARACT EXTRACTION W/ INTRAOCULAR LENS IMPLANT Bilateral   . COLON RESECTION    . CYSTOSCOPY W/ RETROGRADES Left 06/08/2015   Procedure: CYSTOSCOPY WITH RETROGRADE PYELOGRAM;  Surgeon: Nickie Retort, MD;  Location: [facility] ORS;  Service: Urology;  Laterality: Left;  . CYSTOSCOPY W/ URETERAL STENT PLACEMENT Left 10/05/2015   Procedure: CYSTOSCOPY WITH STENT REPLACEMENT;  Surgeon: Nickie Retort, MD;  Location: [facility] OR[facility];  Service: Urology;  Laterality: Left;  . CYSTOSCOPY W/ URETERAL STENT PLACEMENT Left 01/04/2016   Procedure: CYSTOSCOPY WITH STENT REPLACEMENT;  Surgeon: Nickie Retort, MD;  Location: [facility] OR[facility];  Service: Urology;  Laterality: Left;  . CYSTOSCOPY W/ URETERAL STENT PLACEMENT Left 04/11/2016   Procedure: CYSTOSCOPY WITH STENT REPLACEMENT;  Surgeon: Nickie Retort, MD;  Location: [facility] OR[facility];  Service: Urology;  Laterality: Left;  . CYSTOSCOPY W/ URETERAL STENT PLACEMENT Left 07/18/2016   Procedure: CYSTOSCOPY WITH STENT REPLACEMENT;  Surgeon: Nickie Retort, MD;  Location: [facility] OR[facility];  Service: Urology;  Laterality: Left;  . CYSTOSCOPY W/ URETERAL STENT PLACEMENT Left 11/02/2016   Procedure: CYSTOSCOPY WITH STENT REPLACEMENT;  Surgeon: Nickie Retort, MD;  Location: [facility] OR[facility];  Service: Urology;  Laterality: Left;  . CYSTOSCOPY W/ URETERAL STENT PLACEMENT Left 02/08/2017   Procedure: CYSTOSCOPY WITH STENT REPLACEMENT;  Surgeon: Nickie Retort, MD;  Location: [facility] ORS;  Service: Urology;  Laterality: Left;  . EYE SURGERY Bilateral Sept and Oct.2012   cataract  . HERNIA REPAIR    . PROSTATECTOMY  1994    FAMILY HISTORY :   Family History  Problem Relation Age of Onset  . Aneurysm Father   . Cancer Mother        breast    SOCIAL HISTORY:   Social History  Substance Use Topics  . Smoking status: Former Smoker    Packs/day: 1.50    Years: 25.00    Types: Cigarettes    Quit date: 01/21/1974   . Smokeless tobacco: Never Used  . Alcohol use No    ALLERGIES:  has No Known Allergies.  MEDICATIONS:  Current Outpatient Prescriptions  Medication Sig Dispense Refill  . acetaminophen (TYLENOL) 500 MG tablet Take 1,000 mg by mouth at bedtime.    Marland Kitchen allopurinol (ZYLOPRIM) 300 MG tablet Take 300 mg by mouth every morning.     Marland Kitchen aspirin 81 MG tablet Take 81 mg by mouth every morning. Reported on 01/03/2016    . carvedilol (COREG) 6.25 MG tablet Take 1 tablet (6.25 mg total) by mouth 2 (two) times daily with a meal. 60 tablet 2  . ergocalciferol (VITAMIN D2) 50000 units capsule Take 50,000 Units by mouth once a week. Tuesday     . ferrous sulfate 325 (65 FE) MG tablet Take 325 mg by mouth 2 (two) times daily with a meal.     . oxybutynin (DITROPAN) 5 MG tablet Take 5 mg by mouth 4 (four) times daily.    Marland Kitchen oxyCODONE-acetaminophen (PERCOCET) 7.5-325 MG tablet Take 1 tablet by mouth every 6 (six) hours as needed for severe pain. 120 tablet 0  . pantoprazole (PROTONIX) 40 MG tablet Take 1 tablet (40 mg total) by mouth daily before breakfast. 30 tablet 2  . ranitidine (ZANTAC) 150 MG tablet Take 150 mg by mouth at bedtime.     . verapamil (CALAN-SR) 240 MG CR tablet Take 1 tablet (240 mg total) by mouth daily. (Patient taking differently: Take 240 mg by mouth 2 (two) times daily. ) 30 tablet 2  . vitamin B-12 (CYANOCOBALAMIN) 1000 MCG tablet Take 1,000 mcg by mouth daily.    Marland Kitchen ibuprofen (ADVIL,MOTRIN) 200 MG tablet Take 200 mg by mouth every 6 (six) hours as needed.     Current Facility-Administered Medications  Medication Dose Route Frequency Provider Last Rate Last Dose  . morphine 2 MG/ML injection 2 mg  2 mg Intravenous Once Cammie Sickle, MD       Facility-Administered Medications Ordered in Other Visits  Medication Dose Route Frequency Provider Last Rate Last Dose  . 0.9 %  sodium chloride infusion   Intravenous Continuous Wylene Simmer, MD        PHYSICAL EXAMINATION: ECOG  PERFORMANCE STATUS: 0 - Asymptomatic  BP (!) 149/81   Pulse 69   Temp 97.6 F (36.4 C) (Tympanic)   Resp 20   Ht 5\' 11"  (1.803 m)   Wt 204 lb 6.4 oz (92.7 kg)   BMI 28.51 kg/m   Filed Weights   03/11/17 1343  Weight: 204 lb 6.4 oz (92.7 kg)    GENERAL: Well-nourished well-developed; Alert, no distress and comfortable.   Accompanied by his wife. Patient is in a wheelchair because of left hip pain. EYES: no pallor or icterus OROPHARYNX: no thrush or ulceration; dentures. NECK: supple, no masses felt LYMPH:  no palpable lymphadenopathy in the cervical, axillary or inguinal regions LUNGS: clear  to auscultation and  No wheeze or crackles HEART/CVS: regular rate & rhythm and no murmurs; No lower extremity edema ABDOMEN:abdomen soft, non-tender and normal bowel sounds Musculoskeletal:no cyanosis of digits and no clubbing  PSYCH: alert & oriented x 3 with fluent speech NEURO: no focal motor/sensory deficits SKIN:  no rashes or significant lesions  LABORATORY DATA:  I have reviewed the data as listed    Component Value Date/Time   NA 136 02/25/2017 1110   K 4.2 02/25/2017 1110   CL 104 02/25/2017 1110   CO2 25 02/25/2017 1110   GLUCOSE 138 (H) 02/25/2017 1110   BUN 29 (H) 02/25/2017 1110   CREATININE 1.31 (H) 02/25/2017 1110   CALCIUM 10.2 02/25/2017 1110   PROT 7.3 02/25/2017 1110   ALBUMIN 3.9 02/25/2017 1110   AST 21 02/25/2017 1110   ALT 15 (L) 02/25/2017 1110   ALKPHOS 65 02/25/2017 1110   BILITOT 0.3 02/25/2017 1110   GFRNONAA 52 (L) 02/25/2017 1110   GFRAA 60 (L) 02/25/2017 1110    No results found for: SPEP, UPEP  Lab Results  Component Value Date   WBC 10.7 (H) 02/25/2017   NEUTROABS 6.9 (H) 02/25/2017   HGB 13.3 02/25/2017   HCT 38.8 (L) 02/25/2017   MCV 89.1 02/25/2017   PLT 212 02/25/2017      Chemistry      Component Value Date/Time   NA 136 02/25/2017 1110   K 4.2 02/25/2017 1110   CL 104 02/25/2017 1110   CO2 25 02/25/2017 1110   BUN 29 (H)  02/25/2017 1110   CREATININE 1.31 (H) 02/25/2017 1110      Component Value Date/Time   CALCIUM 10.2 02/25/2017 1110   ALKPHOS 65 02/25/2017 1110   AST 21 02/25/2017 1110   ALT 15 (L) 02/25/2017 1110   BILITOT 0.3 02/25/2017 1110     Results for ISIDOR, BROMELL (MRN [id number]) as of 03/11/2017 13:41  Ref. Range 11/21/2015 13:31 03/07/2016 14:55 06/28/2016 13:40 10/19/2016 13:35 02/25/2017 11:10  PSA Latest Ref Range: 0.00 - 4.00 ng/mL 0.12 0.06 0.02 0.10   Prostatic Specific Antigen Latest Ref Range: 0.00 - 4.00 ng/mL     0.39    IMPRESSION: Areas of bony metastatic disease in the lumbar spine, probable lower thoracic spine, and right ischium. There appears to be an increase in abnormal uptake in the lower thoracic spinous process regions at T9 and T10 as well as in portions of the L3 and L5 vertebrae compared the prior study. Radiographic correlation advised to confirm progression of bony metastatic disease in these areas. The abnormal radiotracer uptake at L4 and in the right ischium are stable. Old rib fractures show stable uptake bilaterally. Multiple areas of arthropathy noted. Fullness of the left renal collecting system has been documented previously and accounts for the decreased uptake in the left kidney compared to the right, a stable finding.   Electronically Signed   By: Lowella Grip III M.D.   On: 03/08/2017 13:01  ASSESSMENT & PLAN:   Prostate cancer metastatic to bone [facility]) # Metastatic castrate sensitive prostate cancer- currently on Lupron/X-geva every 4 months [02/27/2017]. Last psa July 2018- 0.3/which is stable/ slightly rising. ? Patient in process of turning into castrate resistant phase. Will follow up subsequent PSAs.   # left hip pain-bone scan does not show any progressive lesions in the left hip/also x-rays are negative.  We will refer the patient to orthopedics.   A prescription for Percocet given.   # Bladder  mass- on recent ER [place]- will review with  Dr.Budzyn.   # Bone mets [02/27/2017-last X-geva]  # Left ureteral stent/hydronephrosis- likely from pelvic adenopathy from his malignancy/ CKD- stage III- creat 1.3. Reviewed the labs.   # follow up in 4 weeks/ no labs.     Cammie Sickle, MD 03/11/2017 4:04 PM

## 2017-03-11 NOTE — Assessment & Plan Note (Addendum)
#   Metastatic castrate sensitive prostate cancer- currently on Lupron/X-geva every 4 months [02/27/2017]. Last psa July 2018- 0.3/which is stable/ slightly rising. ? Patient in process of turning into castrate resistant phase. Will follow up subsequent PSAs.   # left hip pain-bone scan does not show any progressive lesions in the left hip/also x-rays are negative.  We will refer the patient to orthopedics.   A prescription for Percocet given.   # Bladder mass- on recent ER [place]- will review with Dr.Budzyn.   # Bone mets [02/27/2017-last X-geva]  # Left ureteral stent/hydronephrosis- likely from pelvic adenopathy from his malignancy/ CKD- stage III- creat 1.3. Reviewed the labs.   # follow up in 4 weeks/ no labs.

## 2017-03-11 NOTE — Progress Notes (Signed)
Patient here for f/u h/o metastatic prostate cancer. Here to discuss bone scan results.

## 2017-03-18 ENCOUNTER — Telehealth: Payer: Self-pay | Admitting: *Deleted

## 2017-03-18 NOTE — Telephone Encounter (Signed)
pt calling - spoke with [facility] urological. Pt wants to f/u on his orthopedic referral. orders entered on 7/23. pt has not been contacted with ortho referral. Pt states that he called and left msg for nurse last Friday and has not had call back from [place] sent to sch. Team to call patient with update and status of the referral.  I personally attempted to call patient to acknowledge his phone call. Left vm.

## 2017-03-19 NOTE — Telephone Encounter (Signed)
Reviewed chart in care everywhere -  apt with orthopedics- 04/17/2017

## 2017-03-28 ENCOUNTER — Telehealth: Payer: Self-pay | Admitting: *Deleted

## 2017-03-28 NOTE — Telephone Encounter (Signed)
Ok to r/s s/p orthopedic apts.  I will send msg to sch. Team to r/s apts.

## 2017-03-28 NOTE — Telephone Encounter (Signed)
Patient called and said that he does not see Ortho until the 29th and his FU with Dr B is on th 20th. Asking if he needs to reschedule Dr B appt after he sees Ortho. Please advise

## 2017-04-08 ENCOUNTER — Telehealth: Payer: Self-pay | Admitting: *Deleted

## 2017-04-08 ENCOUNTER — Ambulatory Visit: Payer: Medicare PPO | Admitting: Internal Medicine

## 2017-04-08 NOTE — Telephone Encounter (Signed)
Spoke with patient. He gave verbal understanding that md will hold off signing the form at this time. He states that he was not aware that the form asked for Dr. B to sign that he had a terminal illness and life expectancy of 1 years time. He stated that he would contact the insurance co to ensure pt has the right forms.

## 2017-04-08 NOTE — Telephone Encounter (Addendum)
The fax for a document for statement of terminal illness came in on Friday. Dr. B refusing to complete b/c it states pt has only 1 year to live which is not applicable to patient's dx.  Who is calling about it?  He said he would discuss this pt at next office visit before signing the document.

## 2017-04-08 NOTE — Telephone Encounter (Signed)
-----   Message from Shawnee Knapp, RN sent at 04/08/2017 12:13 PM EDT ----- Regarding: [name], Have you received any faxes from this patient's insurance company?

## 2017-04-09 ENCOUNTER — Telehealth: Payer: Self-pay | Admitting: *Deleted

## 2017-04-09 NOTE — Telephone Encounter (Signed)
Patient asking for a copy of the form of terminal illness. Caryl Pina then called and said that they just need the date of disease considered terminal, not the date of diagnosis so that he has access to his 17 K to pay his medical bills or he will have to stop coming for treatments

## 2017-04-10 ENCOUNTER — Other Ambulatory Visit: Payer: Self-pay | Admitting: Radiology

## 2017-04-10 ENCOUNTER — Telehealth: Payer: Self-pay | Admitting: Radiology

## 2017-04-10 DIAGNOSIS — N1339 Other hydronephrosis: Secondary | ICD-10-CM

## 2017-04-10 NOTE — Telephone Encounter (Signed)
Forms completed

## 2017-04-10 NOTE — Telephone Encounter (Signed)
-----   Message from Nickie Retort, MD sent at 02/08/2017  9:08 AM EDT ----- Patient needs cysto, L stent exchange in 3 months. thanks

## 2017-04-10 NOTE — Telephone Encounter (Signed)
Pt scheduled for left ureteral stent exchange with Dr Pilar Jarvis on 05/10/17. Made pt aware of surgery date, pre-admit testing appt & to call day prior to surgery for arrival time to SDS. Advised pt to continue ASA 81mg  per Dr Pilar Jarvis. Questions answered. Pt voices understanding.

## 2017-04-11 NOTE — Telephone Encounter (Signed)
Forms faxed to [place] 1 [phone].  Rn contacted patient- made pt aware that forms were faxed. He requested that I mail the original copy to him. Address of patient verified.

## 2017-04-18 ENCOUNTER — Telehealth: Payer: Self-pay | Admitting: *Deleted

## 2017-04-18 NOTE — Telephone Encounter (Signed)
Asking again for a copy of the letter that was completed for Mr [name] for his retirement. Caryl Pina asks that a copy be faxed to her.  [name] mailed it to patient,  Patient contacted and said he got it the other day and will let Caryl Pina know he has it

## 2017-04-23 ENCOUNTER — Ambulatory Visit: Payer: Medicare PPO | Admitting: Nurse Practitioner

## 2017-04-25 ENCOUNTER — Inpatient Hospital Stay: Payer: Medicare PPO | Attending: Internal Medicine | Admitting: Internal Medicine

## 2017-04-25 VITALS — BP 154/88 | HR 72 | Temp 97.1°F | Resp 16 | Wt 206.1 lb

## 2017-04-25 DIAGNOSIS — Z7982 Long term (current) use of aspirin: Secondary | ICD-10-CM | POA: Insufficient documentation

## 2017-04-25 DIAGNOSIS — I350 Nonrheumatic aortic (valve) stenosis: Secondary | ICD-10-CM | POA: Diagnosis not present

## 2017-04-25 DIAGNOSIS — I10 Essential (primary) hypertension: Secondary | ICD-10-CM | POA: Diagnosis not present

## 2017-04-25 DIAGNOSIS — C7951 Secondary malignant neoplasm of bone: Secondary | ICD-10-CM | POA: Insufficient documentation

## 2017-04-25 DIAGNOSIS — Z803 Family history of malignant neoplasm of breast: Secondary | ICD-10-CM | POA: Insufficient documentation

## 2017-04-25 DIAGNOSIS — E7800 Pure hypercholesterolemia, unspecified: Secondary | ICD-10-CM | POA: Diagnosis not present

## 2017-04-25 DIAGNOSIS — K219 Gastro-esophageal reflux disease without esophagitis: Secondary | ICD-10-CM | POA: Insufficient documentation

## 2017-04-25 DIAGNOSIS — Z79899 Other long term (current) drug therapy: Secondary | ICD-10-CM | POA: Diagnosis not present

## 2017-04-25 DIAGNOSIS — C61 Malignant neoplasm of prostate: Secondary | ICD-10-CM | POA: Insufficient documentation

## 2017-04-25 DIAGNOSIS — Z87891 Personal history of nicotine dependence: Secondary | ICD-10-CM | POA: Diagnosis not present

## 2017-04-25 DIAGNOSIS — I4892 Unspecified atrial flutter: Secondary | ICD-10-CM | POA: Insufficient documentation

## 2017-04-25 DIAGNOSIS — E119 Type 2 diabetes mellitus without complications: Secondary | ICD-10-CM

## 2017-04-25 DIAGNOSIS — M7072 Other bursitis of hip, left hip: Secondary | ICD-10-CM | POA: Diagnosis not present

## 2017-04-25 MED ORDER — OXYCODONE-ACETAMINOPHEN 7.5-325 MG PO TABS: 1.0000 | ORAL_TABLET | Freq: Three times a day (TID) | ORAL | 0 refills | Status: DC | PRN

## 2017-04-25 NOTE — Assessment & Plan Note (Addendum)
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 Last psa July 2018- 0.3/which is slightly rising. ? Patient in process of turning into castrate resistant phase.   # left hip pain-bone scan does not show any progressive lesions in the left hip/also x-rays are negative.  S/p injection? Buritis.  A prescription for Percocet given.   # Bone mets [02/27/2017-last X-geva].   # Left ureteral stent/hydronephrosis- likely from pelvic adenopathy from his malignancy/ CKD- stage III- creat 1.3. Reviewed the labs.   # follow up second week of Nov 2018/labs-X-geva; Lupron

## 2017-04-25 NOTE — Progress Notes (Signed)
[facility] OFFICE PROGRESS NOTE  Patient Care Team: Tamsen Roers, MD as PCP - General (Family Medicine)   SUMMARY OF ONCOLOGIC HISTORY:  Oncology History   # AUG 2016- METASTATIC PROSTATE CA/ STAGE IV; Castrate sensitive [PSA- 48] ; mets- lumbar spine [s/p pal RT to Aug 2016; Dr.Crystal] /Pelvic LN; 1994- Prostate CA s/p Surgery [facility]]; Lupron q4 M; MARCH 2017- Bone scan- L4/Right pubic rami uptake; PSA- 0.1/testosterone-castrate [<3]  # Bone lesions on denosumab;DEC 2016- Recm q 24M  # Left hydronephrosis s/p stenting     Prostate cancer metastatic to bone [facility])    INTERVAL HISTORY:  A very pleasant 76 year old male patient with above history of prostate cancer stage IV hormone sensitive currently on Lupron every 4 months is here for follow-up.   Patient has been evaluated by orthopedics received an injection for his left hip bursitis. Slightly improved not completely resolved. He continues to be on Percocet every 6-8 hours.   He is not losing any weight. No hot flashes. Denies any worsening back pain. No chest pain or shortness of breath or cough. He denies any jaw pain.   REVIEW OF SYSTEMS:  A complete 10 point review of system is done which is negative except mentioned above/history of present illness.   PAST MEDICAL HISTORY :  Past Medical History:  Diagnosis Date  . Aortic stenosis, moderate 01/30/2014  . Arthritis   . Atrial flutter ([place]) 02/02/2014  . Diabetes mellitus without complication (HCC)    diet control  . GERD (gastroesophageal reflux disease)   . Heart murmur   . Heart murmur   . HTN (hypertension) 01/30/2014  . Hypercholesteremia   . Hypertension   . Prostate cancer ([place])    metastatic  . Weakness of left leg     PAST SURGICAL HISTORY :   Past Surgical History:  Procedure Laterality Date  . CATARACT EXTRACTION W/ INTRAOCULAR LENS IMPLANT Bilateral   . COLON RESECTION    . CYSTOSCOPY W/ RETROGRADES Left 06/08/2015   Procedure: CYSTOSCOPY WITH RETROGRADE PYELOGRAM;  Surgeon: Nickie Retort, MD;  Location: [facility] OR[facility];  Service: Urology;  Laterality: Left;  . CYSTOSCOPY W/ URETERAL STENT PLACEMENT Left 10/05/2015   Procedure: CYSTOSCOPY WITH STENT REPLACEMENT;  Surgeon: Nickie Retort, MD;  Location: [facility];  Service: Urology;  Laterality: Left;  . CYSTOSCOPY W/ URETERAL STENT PLACEMENT Left 01/04/2016   Procedure: CYSTOSCOPY WITH STENT REPLACEMENT;  Surgeon: Nickie Retort, MD;  Location: [facility];  Service: Urology;  Laterality: Left;  . CYSTOSCOPY W/ URETERAL STENT PLACEMENT Left 04/11/2016   Procedure: CYSTOSCOPY WITH STENT REPLACEMENT;  Surgeon: Nickie Retort, MD;  Location: [facility];  Service: Urology;  Laterality: Left;  . CYSTOSCOPY W/ URETERAL STENT PLACEMENT Left 07/18/2016   Procedure: CYSTOSCOPY WITH STENT REPLACEMENT;  Surgeon: Nickie Retort, MD;  Location: [facility];  Service: Urology;  Laterality: Left;  . CYSTOSCOPY W/ URETERAL STENT PLACEMENT Left 11/02/2016   Procedure: CYSTOSCOPY WITH STENT REPLACEMENT;  Surgeon: Nickie Retort, MD;  Location: [facility];  Service: Urology;  Laterality: Left;  . CYSTOSCOPY W/ URETERAL STENT PLACEMENT Left 02/08/2017   Procedure: CYSTOSCOPY WITH STENT REPLACEMENT;  Surgeon: Nickie Retort, MD;  Location: [facility];  Service: Urology;  Laterality: Left;  . EYE SURGERY Bilateral Sept and Oct.2012   cataract  . HERNIA REPAIR    . PROSTATECTOMY  1994    FAMILY HISTORY :   Family History  Problem Relation Age of Onset  .  Aneurysm Father   . Cancer Mother        breast    SOCIAL HISTORY:   Social History  Substance Use Topics  . Smoking status: Former Smoker    Packs/day: 1.50    Years: 25.00    Types: Cigarettes    Quit date: 01/21/1974  . Smokeless tobacco: Never Used  . Alcohol use No    ALLERGIES:  has No Known Allergies.  MEDICATIONS:  Current Outpatient Prescriptions  Medication Sig Dispense Refill  . acetaminophen  (TYLENOL) 500 MG tablet Take 1,000 mg by mouth at bedtime.    Marland Kitchen allopurinol (ZYLOPRIM) 300 MG tablet Take 300 mg by mouth every morning.     Marland Kitchen aspirin 81 MG tablet Take 81 mg by mouth every morning. Reported on 01/03/2016    . carvedilol (COREG) 6.25 MG tablet Take 1 tablet (6.25 mg total) by mouth 2 (two) times daily with a meal. 60 tablet 2  . ERGOCALCIFEROL PO Take 1 capsule by mouth daily. Tuesday     . ferrous sulfate 325 (65 FE) MG tablet Take 325 mg by mouth daily with breakfast.     . oxybutynin (DITROPAN) 5 MG tablet Take 5 mg by mouth 4 (four) times daily.    Marland Kitchen oxyCODONE-acetaminophen (PERCOCET) 7.5-325 MG tablet Take 1 tablet by mouth every 8 (eight) hours as needed for severe pain. 90 tablet 0  . pantoprazole (PROTONIX) 40 MG tablet Take 1 tablet (40 mg total) by mouth daily before breakfast. 30 tablet 2  . ranitidine (ZANTAC) 150 MG tablet Take 150 mg by mouth 2 (two) times daily.     . verapamil (CALAN-SR) 240 MG CR tablet Take 1 tablet (240 mg total) by mouth daily. (Patient taking differently: Take 240 mg by mouth 2 (two) times daily. ) 30 tablet 2  . vitamin B-12 (CYANOCOBALAMIN) 1000 MCG tablet Take 1,000 mcg by mouth daily.    . Ascorbic Acid (VITAMIN C) 100 MG tablet Take 500 mg by mouth daily.    Marland Kitchen atorvastatin (LIPITOR) 10 MG tablet Take 10 mg by mouth daily.     Current Facility-Administered Medications  Medication Dose Route Frequency Provider Last Rate Last Dose  . morphine 2 MG/ML injection 2 mg  2 mg Intravenous Once Cammie Sickle, MD       Facility-Administered Medications Ordered in Other Visits  Medication Dose Route Frequency Provider Last Rate Last Dose  . 0.9 %  sodium chloride infusion   Intravenous Continuous Wylene Simmer, MD        PHYSICAL EXAMINATION: ECOG PERFORMANCE STATUS: 0 - Asymptomatic  BP (!) 154/88 (BP Location: Left Arm, Patient Position: Sitting)   Pulse 72   Temp (!) 97.1 F (36.2 C) (Tympanic)   Resp 16   Wt 206 lb 1.6 oz (93.5  kg)   BMI 28.75 kg/m   Filed Weights   04/25/17 1134  Weight: 206 lb 1.6 oz (93.5 kg)    GENERAL: Well-nourished well-developed; Alert, no distress and comfortable.   Accompanied by his wife. Patient is in a wheelchair because of left hip pain. EYES: no pallor or icterus OROPHARYNX: no thrush or ulceration; dentures. NECK: supple, no masses felt LYMPH:  no palpable lymphadenopathy in the cervical, axillary or inguinal regions LUNGS: clear to auscultation and  No wheeze or crackles HEART/CVS: regular rate & rhythm and no murmurs; No lower extremity edema ABDOMEN:abdomen soft, non-tender and normal bowel sounds Musculoskeletal:no cyanosis of digits and no clubbing  PSYCH: alert & oriented x  3 with fluent speech NEURO: no focal motor/sensory deficits SKIN:  no rashes or significant lesions  LABORATORY DATA:  I have reviewed the data as listed    Component Value Date/Time   NA 139 04/30/2017 1124   K 4.3 04/30/2017 1124   CL 105 04/30/2017 1124   CO2 26 04/30/2017 1124   GLUCOSE 168 (H) 04/30/2017 1124   BUN 34 (H) 04/30/2017 1124   CREATININE 1.32 (H) 04/30/2017 1124   CALCIUM 10.1 04/30/2017 1124   PROT 7.3 02/25/2017 1110   ALBUMIN 3.9 02/25/2017 1110   AST 21 02/25/2017 1110   ALT 15 (L) 02/25/2017 1110   ALKPHOS 65 02/25/2017 1110   BILITOT 0.3 02/25/2017 1110   GFRNONAA 51 (L) 04/30/2017 1124   GFRAA 59 (L) 04/30/2017 1124    No results found for: SPEP, UPEP  Lab Results  Component Value Date   WBC 11.3 (H) 04/30/2017   NEUTROABS 6.9 (H) 02/25/2017   HGB 12.8 (L) 04/30/2017   HCT 38.7 (L) 04/30/2017   MCV 87.8 04/30/2017   PLT 215 04/30/2017      Chemistry      Component Value Date/Time   NA 139 04/30/2017 1124   K 4.3 04/30/2017 1124   CL 105 04/30/2017 1124   CO2 26 04/30/2017 1124   BUN 34 (H) 04/30/2017 1124   CREATININE 1.32 (H) 04/30/2017 1124      Component Value Date/Time   CALCIUM 10.1 04/30/2017 1124   ALKPHOS 65 02/25/2017 1110   AST  21 02/25/2017 1110   ALT 15 (L) 02/25/2017 1110   BILITOT 0.3 02/25/2017 1110     Results for KALIB, BHAGAT (MRN [id number]) as of 03/11/2017 13:41  Ref. Range 11/21/2015 13:31 03/07/2016 14:55 06/28/2016 13:40 10/19/2016 13:35 02/25/2017 11:10  PSA Latest Ref Range: 0.00 - 4.00 ng/mL 0.12 0.06 0.02 0.10   Prostatic Specific Antigen Latest Ref Range: 0.00 - 4.00 ng/mL     0.39    IMPRESSION: Areas of bony metastatic disease in the lumbar spine, probable lower thoracic spine, and right ischium. There appears to be an increase in abnormal uptake in the lower thoracic spinous process regions at T9 and T10 as well as in portions of the L3 and L5 vertebrae compared the prior study. Radiographic correlation advised to confirm progression of bony metastatic disease in these areas. The abnormal radiotracer uptake at L4 and in the right ischium are stable. Old rib fractures show stable uptake bilaterally. Multiple areas of arthropathy noted. Fullness of the left renal collecting system has been documented previously and accounts for the decreased uptake in the left kidney compared to the right, a stable finding.   Electronically Signed   By: Lowella Grip III M.D.   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 Last psa July 2018- 0.3/which is slightly rising. ? Patient in process of turning into castrate resistant phase.   # left hip pain-bone scan does not show any progressive lesions in the left hip/also x-rays are negative.  S/p injection? Buritis.  A prescription for Percocet given.   # Bone mets [02/27/2017-last X-geva].   # Left ureteral stent/hydronephrosis- likely from pelvic adenopathy from his malignancy/ CKD- stage III- creat 1.3. Reviewed the labs.   # follow up second week of Nov 2018/labs-X-geva; Lupron    Cammie Sickle,  MD 05/05/2017 7:43 PM

## 2017-04-30 ENCOUNTER — Encounter
Admission: RE | Admit: 2017-04-30 | Discharge: 2017-04-30 | Disposition: A | Discharge status: Home / Self Care | Payer: Medicare PPO | Source: Ambulatory Visit | Attending: Urology | Admitting: Urology

## 2017-04-30 DIAGNOSIS — N1339 Other hydronephrosis: Secondary | ICD-10-CM | POA: Insufficient documentation

## 2017-04-30 DIAGNOSIS — Z01818 Encounter for other preprocedural examination: Secondary | ICD-10-CM | POA: Diagnosis present

## 2017-04-30 LAB — BASIC METABOLIC PANEL
ANION GAP: 8 (ref 5–15)
BUN: 34 mg/dL — ABNORMAL HIGH (ref 6–20)
CHLORIDE: 105 mmol/L (ref 101–111)
CO2: 26 mmol/L (ref 22–32)
Calcium: 10.1 mg/dL (ref 8.9–10.3)
Creatinine, Ser: 1.32 mg/dL — ABNORMAL HIGH (ref 0.61–1.24)
GFR, EST AFRICAN AMERICAN: 59 mL/min — AB (ref >60)
GFR, EST NON AFRICAN AMERICAN: 51 mL/min — AB (ref >60)
Glucose, Bld: 168 mg/dL — ABNORMAL HIGH (ref 65–99)
POTASSIUM: 4.3 mmol/L (ref 3.5–5.1)
SODIUM: 139 mmol/L (ref 135–145)

## 2017-04-30 LAB — CBC
HCT: 38.7 % — ABNORMAL LOW (ref 40.0–52.0)
HEMOGLOBIN: 12.8 g/dL — AB (ref 13.0–18.0)
MCH: 29 pg (ref 26.0–34.0)
MCHC: 33.1 g/dL (ref 32.0–36.0)
MCV: 87.8 fL (ref 80.0–100.0)
PLATELETS: 215 10*3/uL (ref 150–440)
RBC: 4.41 MIL/uL (ref 4.40–5.90)
RDW: 15.3 % — ABNORMAL HIGH (ref 11.5–14.5)
WBC: 11.3 10*3/uL — ABNORMAL HIGH (ref 3.8–10.6)

## 2017-04-30 NOTE — Patient Instructions (Signed)
Your procedure is scheduled on: Friday May 10, 2017 Report to Same Day Surgery on the 2nd floor in the [facility]. To find out your arrival time, please call [phone] between 1PM - 3PM on: Thursday May 10, 2107  REMEMBER: Instructions that are not followed completely may result in serious medical risk up to and including death; or upon the discretion of your surgeon and anesthesiologist your surgery may need to be rescheduled.  Do not eat food or drink liquids after midnight. No gum chewing or hard candies.  You may however, drink CLEAR liquids up to 2 hours before you are scheduled to arrive at the hospital for your procedure.  Do not drink clear liquids within 2 hours of your scheduled arrival to the hospital as this may lead to your procedure being delayed or rescheduled.  Clear liquids include: - water   DO NOT drink anything not on this list.  Type 1 and Type 2 diabetics should only drink water.  No Alcohol for 24 hours before or after surgery.  No Smoking for 24 hours prior to surgery.  Notify your doctor if there is any change in your medical condition (cold, fever, infection).  Do not wear jewelry, make-up, hairpins, clips or nail polish.  Do not wear lotions, powders, or perfumes.   Do not shave 48 hours prior to surgery. Men may shave face and neck.  Contacts and dentures may not be worn into surgery.  Do not bring valuables to the hospital. [facility] is not responsible for any belongings or valuables.   TAKE THESE MEDICATIONS THE MORNING OF SURGERY WITH A SIP OF WATER: ALLOPURINOL CARVEDILOL OXYBUTYNIN PANTOPRAZOLE BUT ALSO A DOSE NIGHT BEFORE VERAPAMIL   Follow recommendations from Cardiologist, Pulmonologist or PCP regarding stopping Aspirin, Coumadin, Plavix, Eliquis, Pradaxa, or Pletal.  Stop Anti-inflammatories such as Advil, Aleve, Ibuprofen, Motrin, Naproxen, Naprosyn, Goodie powder, or aspirin products. (May take Tylenol or  Acetaminophen and Celebrex if needed.)  Stop supplements until after surgery. (May continue Vitamin D, Vitamin B, FERROUS  and multivitamin.)  If you are being admitted to the hospital overnight, leave your suitcase in the car. After surgery it may be brought to your room.  If you are being discharged the day of surgery, you will not be allowed to drive home. You will need someone to drive you home and stay with you that night.   If you are taking public transportation, you will need to have a responsible adult to with you.  Please call the number above if you have any questions about these instructions.

## 2017-04-30 NOTE — Progress Notes (Unsigned)
Patient had an appointment with PSN today at 10:15. Patient did not show up for his appointment.

## 2017-05-02 ENCOUNTER — Telehealth: Payer: Self-pay | Admitting: Radiology

## 2017-05-02 LAB — URINE CULTURE: Culture: 10000 — AB

## 2017-05-02 NOTE — Pre-Procedure Instructions (Signed)
UC FAXED TO DR Pilar Jarvis

## 2017-05-02 NOTE — Telephone Encounter (Signed)
UCx results reviewed by Dr Pilar Jarvis. No orders received at this time.

## 2017-05-10 ENCOUNTER — Ambulatory Visit: Payer: Medicare PPO | Admitting: Anesthesiology

## 2017-05-10 ENCOUNTER — Encounter
Admission: RE | Disposition: A | Discharge status: Home / Self Care | Payer: Self-pay | Source: Ambulatory Visit | Attending: Urology

## 2017-05-10 ENCOUNTER — Encounter: Payer: Self-pay | Admitting: Emergency Medicine

## 2017-05-10 ENCOUNTER — Ambulatory Visit
Admission: RE | Admit: 2017-05-10 | Discharge: 2017-05-10 | Disposition: A | Discharge status: Home / Self Care | Payer: Medicare PPO | Source: Ambulatory Visit | Attending: Urology | Admitting: Urology

## 2017-05-10 DIAGNOSIS — Z9079 Acquired absence of other genital organ(s): Secondary | ICD-10-CM | POA: Diagnosis not present

## 2017-05-10 DIAGNOSIS — Z79899 Other long term (current) drug therapy: Secondary | ICD-10-CM | POA: Diagnosis not present

## 2017-05-10 DIAGNOSIS — I10 Essential (primary) hypertension: Secondary | ICD-10-CM | POA: Diagnosis not present

## 2017-05-10 DIAGNOSIS — I350 Nonrheumatic aortic (valve) stenosis: Secondary | ICD-10-CM | POA: Diagnosis not present

## 2017-05-10 DIAGNOSIS — Z87891 Personal history of nicotine dependence: Secondary | ICD-10-CM | POA: Diagnosis not present

## 2017-05-10 DIAGNOSIS — N132 Hydronephrosis with renal and ureteral calculous obstruction: Secondary | ICD-10-CM | POA: Insufficient documentation

## 2017-05-10 DIAGNOSIS — C7951 Secondary malignant neoplasm of bone: Secondary | ICD-10-CM | POA: Insufficient documentation

## 2017-05-10 DIAGNOSIS — I4892 Unspecified atrial flutter: Secondary | ICD-10-CM | POA: Diagnosis not present

## 2017-05-10 DIAGNOSIS — Z7982 Long term (current) use of aspirin: Secondary | ICD-10-CM | POA: Diagnosis not present

## 2017-05-10 DIAGNOSIS — C61 Malignant neoplasm of prostate: Secondary | ICD-10-CM | POA: Insufficient documentation

## 2017-05-10 DIAGNOSIS — E7800 Pure hypercholesterolemia, unspecified: Secondary | ICD-10-CM | POA: Insufficient documentation

## 2017-05-10 DIAGNOSIS — N1339 Other hydronephrosis: Secondary | ICD-10-CM

## 2017-05-10 DIAGNOSIS — Z803 Family history of malignant neoplasm of breast: Secondary | ICD-10-CM | POA: Diagnosis not present

## 2017-05-10 DIAGNOSIS — K219 Gastro-esophageal reflux disease without esophagitis: Secondary | ICD-10-CM | POA: Insufficient documentation

## 2017-05-10 DIAGNOSIS — N1330 Unspecified hydronephrosis: Secondary | ICD-10-CM | POA: Diagnosis not present

## 2017-05-10 DIAGNOSIS — E119 Type 2 diabetes mellitus without complications: Secondary | ICD-10-CM | POA: Diagnosis not present

## 2017-05-10 HISTORY — PX: CYSTOSCOPY W/ URETERAL STENT PLACEMENT: SHX1429

## 2017-05-10 LAB — GLUCOSE, CAPILLARY
GLUCOSE-CAPILLARY: 108 mg/dL — AB (ref 65–99)
GLUCOSE-CAPILLARY: 127 mg/dL — AB (ref 65–99)

## 2017-05-10 SURGERY — CYSTOSCOPY, FLEXIBLE, WITH STENT REPLACEMENT
Anesthesia: General | Site: Ureter | Laterality: Left | Wound class: Clean

## 2017-05-10 MED ORDER — CEFAZOLIN SODIUM-DEXTROSE 2-4 GM/100ML-% IV SOLN
INTRAVENOUS | Status: AC
  Filled 2017-05-10: qty 100

## 2017-05-10 MED ORDER — FENTANYL CITRATE (PF) 100 MCG/2ML IJ SOLN
INTRAMUSCULAR | Status: AC
Start: 2017-05-10 — End: 2017-05-10
  Filled 2017-05-10: qty 2

## 2017-05-10 MED ORDER — LIDOCAINE HCL (CARDIAC) 20 MG/ML IV SOLN
INTRAVENOUS | Status: DC | PRN
  Administered 2017-05-10: 100 mg via INTRAVENOUS

## 2017-05-10 MED ORDER — FENTANYL CITRATE (PF) 100 MCG/2ML IJ SOLN
25.0000 ug | INTRAMUSCULAR | Status: DC | PRN
  Administered 2017-05-10 (×4): 25 ug via INTRAVENOUS

## 2017-05-10 MED ORDER — IOTHALAMATE MEGLUMINE 17.2 % UR SOLN
URETHRAL | Status: DC | PRN
  Administered 2017-05-10: 5 mL via URETHRAL

## 2017-05-10 MED ORDER — MIDAZOLAM HCL 2 MG/2ML IJ SOLN
INTRAMUSCULAR | Status: AC
  Filled 2017-05-10: qty 2

## 2017-05-10 MED ORDER — ONDANSETRON HCL 4 MG/2ML IJ SOLN
INTRAMUSCULAR | Status: DC | PRN
  Administered 2017-05-10: 4 mg via INTRAVENOUS

## 2017-05-10 MED ORDER — FENTANYL CITRATE (PF) 100 MCG/2ML IJ SOLN
INTRAMUSCULAR | Status: AC
  Administered 2017-05-10: 25 ug via INTRAVENOUS
  Filled 2017-05-10: qty 2

## 2017-05-10 MED ORDER — LACTATED RINGERS IV SOLN
INTRAVENOUS | Status: DC | PRN
  Administered 2017-05-10: 08:00:00 via INTRAVENOUS

## 2017-05-10 MED ORDER — FENTANYL CITRATE (PF) 100 MCG/2ML IJ SOLN
INTRAMUSCULAR | Status: DC | PRN
  Administered 2017-05-10: 50 ug via INTRAVENOUS

## 2017-05-10 MED ORDER — PROPOFOL 10 MG/ML IV BOLUS
INTRAVENOUS | Status: DC | PRN
  Administered 2017-05-10: 100 mg via INTRAVENOUS
  Administered 2017-05-10: 50 mg via INTRAVENOUS

## 2017-05-10 MED ORDER — CEPHALEXIN 500 MG PO CAPS: 500.0000 mg | ORAL_CAPSULE | Freq: Three times a day (TID) | ORAL | 0 refills | Status: DC

## 2017-05-10 MED ORDER — GLYCOPYRROLATE 0.2 MG/ML IJ SOLN
INTRAMUSCULAR | Status: DC | PRN
  Administered 2017-05-10: 0.2 mg via INTRAVENOUS

## 2017-05-10 MED ORDER — CEFAZOLIN SODIUM-DEXTROSE 2-3 GM-% IV SOLR
INTRAVENOUS | Status: DC | PRN
  Administered 2017-05-10: 2 g via INTRAVENOUS

## 2017-05-10 MED ORDER — CEFAZOLIN SODIUM-DEXTROSE 2-4 GM/100ML-% IV SOLN: 2.0000 g | INTRAVENOUS | Status: DC

## 2017-05-10 MED ORDER — ONDANSETRON HCL 4 MG/2ML IJ SOLN: 4.0000 mg | Freq: Once | INTRAMUSCULAR | Status: DC | PRN

## 2017-05-10 MED ORDER — SODIUM CHLORIDE 0.9 % IV SOLN
INTRAVENOUS | Status: DC
  Administered 2017-05-10: 06:00:00 via INTRAVENOUS

## 2017-05-10 SURGICAL SUPPLY — 20 items
BACTOSHIELD CHG 4% 4OZ (MISCELLANEOUS) ×1
CONTOUR SOFT PERCUFLEX ×2 IMPLANT
GLOVE BIO SURGEON STRL SZ7.5 (GLOVE) ×2 IMPLANT
GOWN STRL REUS W/ TWL LRG LVL4 (GOWN DISPOSABLE) ×1 IMPLANT
GOWN STRL REUS W/TWL LRG LVL4 (GOWN DISPOSABLE) ×1
GOWN STRL REUS W/TWL XL LVL4 (GOWN DISPOSABLE) ×2 IMPLANT
INTRODUCER DILATOR DOUBLE (INTRODUCER) ×2 IMPLANT
KIT RM TURNOVER CYSTO AR (KITS) ×2 IMPLANT
PACK CYSTO AR (MISCELLANEOUS) ×2 IMPLANT
SCRUB CHG 4% DYNA-HEX 4OZ (MISCELLANEOUS) ×1 IMPLANT
SENSORWIRE 0.038 NOT ANGLED (WIRE) ×2
SET CYSTO W/LG BORE CLAMP LF (SET/KITS/TRAYS/PACK) ×2 IMPLANT
SOL .9 NS 3000ML IRR  AL (IV SOLUTION) ×1
SOL .9 NS 3000ML IRR AL (IV SOLUTION) ×1
SOL .9 NS 3000ML IRR UROMATIC (IV SOLUTION) ×1 IMPLANT
STENT URET 6FRX24 CONTOUR (STENTS) IMPLANT
STENT URET 6FRX26 CONTOUR (STENTS) IMPLANT
SURGILUBE 2OZ TUBE FLIPTOP (MISCELLANEOUS) ×2 IMPLANT
WATER STERILE IRR 1000ML POUR (IV SOLUTION) ×2 IMPLANT
WIRE SENSOR 0.038 NOT ANGLED (WIRE) ×1 IMPLANT

## 2017-05-10 NOTE — Anesthesia Post-op Follow-up Note (Signed)
Anesthesia QCDR form completed.        

## 2017-05-10 NOTE — Anesthesia Preprocedure Evaluation (Addendum)
Anesthesia Evaluation  Patient identified by MRN, date of birth, ID band Patient awake    Reviewed: Allergy & Precautions, NPO status , Patient's Chart, lab work & pertinent test results, reviewed documented beta blocker date and time   Airway Mallampati: III  TM Distance: >3 FB     Dental  (+) Chipped, Upper Dentures, Lower Dentures   Pulmonary pneumonia, resolved, former smoker,           Cardiovascular hypertension, Pt. on medications and Pt. on home beta blockers + Past MI  + dysrhythmias Atrial Fibrillation + Valvular Problems/Murmurs      Neuro/Psych    GI/Hepatic GERD  Controlled,  Endo/Other  diabetes, Type 2  Renal/GU Renal disease     Musculoskeletal  (+) Arthritis ,   Abdominal   Peds  Hematology  (+) anemia ,   Anesthesia Other Findings Obese.gout.  Reproductive/Obstetrics                            Anesthesia Physical Anesthesia Plan  ASA: III  Anesthesia Plan: General   Post-op Pain Management:    Induction: Intravenous  PONV Risk Score and Plan:   Airway Management Planned: LMA  Additional Equipment:   Intra-op Plan:   Post-operative Plan:   Informed Consent: I have reviewed the patients History and Physical, chart, labs and discussed the procedure including the risks, benefits and alternatives for the proposed anesthesia with the patient or authorized representative who has indicated his/her understanding and acceptance.     Plan Discussed with: CRNA  Anesthesia Plan Comments:         Anesthesia Quick Evaluation

## 2017-05-10 NOTE — Op Note (Signed)
Preoperative diagnosis:  1. Left hydronephrosis 2. Stage IV metastatic prostate cancer  Postoperative diagnosis:  1. Left hydronephrosis 2. Stage IV metastatic prostate cancer  Procedure: 1. Cystoscopy 2. Left ureteral stent exchange 6 [place] by 26 cm 3. Left retrograde pyelogram with interpertation  Surgeon: Baruch Gouty, MD  Anesthesia: General  Complications: None  Intraoperative findings: The stent was confirmed to be in the correct location on fluoroscopy. Left hydronephrosis was persistent on left retrograde pyelogram.  EBL: None  Specimens: None  Drains: 6 French by 26 cm double-J left ureteral stent  Disposition: Stable to the postanesthesia care unit  Indication for procedure: The patient is a 76 y.o. male with stage IV metastatic prostate cancer with left hydroureteronephrosis presents today for left ureteral stent exchange. After reviewing the management options for treatment, the patient elected to proceed with the above surgical procedure(s). We have discussed the potential benefits and risks of the procedure, side effects of the proposed treatment, the likelihood of the patient achieving the goals of the procedure, and any potential problems that might occur during the procedure or recuperation. Informed consent has been obtained.  Description of procedure: The patient was met in the preoperative area. All risks, benefits, and indications of the procedure were described in great detail. The patient consented to the procedure. Preoperative antibiotics were given. The patient was taken to the operative theater. General anesthesia was induced per the anesthesia service. The patient was then placed in the dorsal lithotomy position and prepped and draped in the usual sterile fashion. A preoperative timeout was called. A 21 French 30 cystoscope was inserted into the patient's bladder per urethra atraumatically. The left ureteral stent was grasped flexible  graspers brought to level urethral meatus. Prior to this though, fluoroscopy was used to locate the level of the renal pelvis via stent curl. Sensor was exchanged left ureteral stent up to level the renal pelvis and the stent was removed.6 and opening catheter exchanged for the sensor wire and a retrograde pyelogram on the left side to place. This showed the patient still having persistent hydronephrosis. The open ended catheter was then exchanged for a sensor wire. The cystoscope was reassembled over the sensor wire inserted back into the patient's bladder. A 6 French by 26 cm double-J ureteral stent was then placed over the sensor wire and advanced to thelevel of the renal pelvis under fluoroscopy. The sensor wire was removed. A curl seen was seen in the patient's left renal pelvis indicating proximal correct placement on fluoroscopy. The correct distal placement of the stent was confirmed with direct visualization within the patient's urinary bladder. There was clear yellow urine draining from the stent further confirming correct placement. The patient's bladder was then drained. The patient was woke from anesthesia and transferred to postanesthesia care unit without apparent complication.  Plan: The patient will follow-up in 3 months for repeat stent exchange. His metastatic stage IV prostate cancer is managed via Lupron/Xgevaby medical oncology.

## 2017-05-10 NOTE — Anesthesia Procedure Notes (Signed)
Procedure Name: LMA Insertion Date/Time: 05/10/2017 8:00 AM Performed by: Leander Rams Pre-anesthesia Checklist: Patient identified, Emergency Drugs available, Suction available, Patient being monitored and Timeout performed Patient Re-evaluated:Patient Re-evaluated prior to induction Oxygen Delivery Method: Circle system utilized Preoxygenation: Pre-oxygenation with 100% oxygen Induction Type: IV induction LMA: LMA inserted LMA Size: 5.0

## 2017-05-10 NOTE — Transfer of Care (Signed)
Immediate Anesthesia Transfer of Care Note  Patient: [name]  Procedure(s) Performed: Procedure(s): CYSTOSCOPY WITH STENT REPLACEMENT (Left)  Patient Location: PACU  Anesthesia Type:General  Level of Consciousness: awake, alert  and patient cooperative  Airway & Oxygen Therapy: Patient Spontanous Breathing and Patient connected to face mask oxygen  Post-op Assessment: Report given to RN and Post -op Vital signs reviewed and stable  Post vital signs: Reviewed and stable  Last Vitals:  Vitals:   05/10/17 0615 05/10/17 0830  BP: (!) 145/77 (!) 142/90  Pulse: 67 67  Resp: 17 14  Temp: (!) 36.1 C (!) 36.2 C  SpO2: 97% 100%    Last Pain:  Vitals:   05/10/17 0615  TempSrc: Tympanic  PainSc: 9          Complications: No apparent anesthesia complications

## 2017-05-10 NOTE — Anesthesia Postprocedure Evaluation (Signed)
Anesthesia Post Note  Patient: [name]  Procedure(s) Performed: Procedure(s) (LRB): CYSTOSCOPY WITH STENT REPLACEMENT (Left)  Patient location during evaluation: PACU Anesthesia Type: General Level of consciousness: awake and alert Pain management: pain level controlled Vital Signs Assessment: post-procedure vital signs reviewed and stable Respiratory status: spontaneous breathing, nonlabored ventilation, respiratory function stable and patient connected to nasal cannula oxygen Cardiovascular status: blood pressure returned to baseline and stable Postop Assessment: no apparent nausea or vomiting Anesthetic complications: no     Last Vitals:  Vitals:   05/10/17 0915 05/10/17 0930  BP: (!) 132/54 125/64  Pulse: 75   Resp:    Temp:    SpO2: 96% 97%    Last Pain:  Vitals:   05/10/17 0915  TempSrc:   PainSc: 0-No pain                 Kourtney Montesinos S

## 2017-05-10 NOTE — Discharge Instructions (Signed)

## 2017-05-10 NOTE — H&P (Signed)
05/10/2017 10:53 AM   [name] Apr 23, 1941 [id number]  Referring provider: Tamsen Roers, [place], [place] [address]      Chief Complaint  Patient presents with  . 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   He had a radical prostatectomy in 1992. We do not have records from this procedure. The patient does not remember where this was done.   He is under the care of the cancer center receiving Lupron and Xgeva for his Stage IVcastrate sensitive metastatic prostate cancer.  Most recent PSA in July 2018 with 0.39.  He does have a known nonobstructing right renal calculus.  PMH:     Past Medical History:  Diagnosis Date  . Aortic stenosis, moderate 01/30/2014  . Arthritis   . Atrial flutter ([place]) 02/02/2014  . Diabetes mellitus without complication (HCC)    diet control  . GERD (gastroesophageal reflux disease)   . Heart murmur   . Heart murmur   . HTN (hypertension) 01/30/2014  . Hypercholesteremia   . Hypertension   . Prostate cancer ([place])    metastatic  . Weakness of left leg     Surgical History:      Past Surgical History:  Procedure Laterality Date  . CATARACT EXTRACTION W/ INTRAOCULAR LENS IMPLANT Bilateral   . COLON RESECTION    . CYSTOSCOPY W/ RETROGRADES Left 06/08/2015   Procedure: CYSTOSCOPY WITH RETROGRADE PYELOGRAM;  Surgeon: Nickie Retort, MD;  Location: [facility];  Service: Urology;  Laterality: Left;  . CYSTOSCOPY W/ URETERAL STENT PLACEMENT Left 10/05/2015   Procedure: CYSTOSCOPY WITH STENT REPLACEMENT;  Surgeon: Nickie Retort, MD;  Location: [facility];  Service: Urology;  Laterality: Left;  . CYSTOSCOPY W/ URETERAL STENT PLACEMENT  Left 01/04/2016   Procedure: CYSTOSCOPY WITH STENT REPLACEMENT;  Surgeon: Nickie Retort, MD;  Location: [facility];  Service: Urology;  Laterality: Left;  . CYSTOSCOPY W/ URETERAL STENT PLACEMENT Left 04/11/2016   Procedure: CYSTOSCOPY WITH STENT REPLACEMENT;  Surgeon: Nickie Retort, MD;  Location: [facility];  Service: Urology;  Laterality: Left;  . CYSTOSCOPY W/ URETERAL STENT PLACEMENT Left 07/18/2016   Procedure: CYSTOSCOPY WITH STENT REPLACEMENT;  Surgeon: Nickie Retort, MD;  Location: [facility];  Service: Urology;  Laterality: Left;  . CYSTOSCOPY W/ URETERAL STENT PLACEMENT Left 11/02/2016   Procedure: CYSTOSCOPY WITH STENT REPLACEMENT;  Surgeon: Nickie Retort, MD;  Location: [facility];  Service: Urology;  Laterality: Left;  . CYSTOSCOPY W/ URETERAL STENT PLACEMENT Left 02/08/2017   Procedure: CYSTOSCOPY WITH STENT REPLACEMENT;  Surgeon: Nickie Retort, MD;  Location: [facility];  Service: Urology;  Laterality: Left;  . EYE SURGERY Bilateral Sept and Oct.2012   cataract  . HERNIA REPAIR    . PROSTATECTOMY  1994    Home Medications:  Allergies as of 02/21/2017   No Known Allergies              Medication List           Accurate as of 02/21/17 10:53 AM. Always use your most recent med list.           acetaminophen 500 MG tablet Commonly known as:  TYLENOL Take 1,000 mg by mouth at  bedtime.   allopurinol 300 MG tablet Commonly known as:  ZYLOPRIM Take 300 mg by mouth every morning.   aspirin 81 MG tablet Take 81 mg by mouth every morning. Reported on 01/03/2016   carvedilol 6.25 MG tablet Commonly known as:  COREG Take 1 tablet (6.25 mg total) by mouth 2 (two) times daily with a meal.   cephALEXin 500 MG capsule Commonly known as:  KEFLEX Take 1 capsule (500 mg total) by mouth 3 (three) times daily.   ergocalciferol 50000 units capsule Commonly known as:  VITAMIN D2 Take 50,000 Units by mouth once a week. Tuesday   ferrous  sulfate 325 (65 FE) MG tablet Take 325 mg by mouth 2 (two) times daily with a meal.   HYDROcodone-acetaminophen 5-325 MG tablet Commonly known as:  NORCO Take 1 tablet by mouth every 4 (four) hours as needed for moderate pain.   oxybutynin 5 MG tablet Commonly known as:  DITROPAN Take 5 mg by mouth 4 (four) times daily.   pantoprazole 40 MG tablet Commonly known as:  PROTONIX Take 1 tablet (40 mg total) by mouth daily before breakfast.   ranitidine 150 MG tablet Commonly known as:  ZANTAC Take 150 mg by mouth at bedtime.   verapamil 240 MG CR tablet Commonly known as:  CALAN-SR Take 1 tablet (240 mg total) by mouth daily.   vitamin B-12 1000 MCG tablet Commonly known as:  CYANOCOBALAMIN Take 1,000 mcg by mouth daily.       Allergies: No Known Allergies  Family History:      Family History  Problem Relation Age of Onset  . Aneurysm Father   . Cancer Mother        breast    Social History:  reports that he quit smoking about 43 years ago. His smoking use included Cigarettes. He has a 37.50 pack-year smoking history. He has never used smokeless tobacco. He reports that he does not drink alcohol or use drugs.  ROS: UROLOGY Frequent Urination?: Yes Hard to postpone urination?: No Burning/pain with urination?: No Get up at night to urinate?: Yes Leakage of urine?: No Urine stream starts and stops?: No Trouble starting stream?: No Do you have to strain to urinate?: No Blood in urine?: No Urinary tract infection?: No Sexually transmitted disease?: No Injury to kidneys or bladder?: No Painful intercourse?: No Weak stream?: No Erection problems?: No Penile pain?: No  Gastrointestinal Nausea?: No Vomiting?: No Indigestion/heartburn?: No Diarrhea?: No Constipation?: Yes  Constitutional Fever: No Night sweats?: No Weight loss?: No Fatigue?: No  Skin Skin rash/lesions?: No Itching?: No  Eyes Blurred vision?: No Double vision?:  No  Ears/Nose/Throat Sore throat?: No Sinus problems?: No  Hematologic/Lymphatic Swollen glands?: No Easy bruising?: Yes  Cardiovascular Leg swelling?: No Chest pain?: No  Respiratory Cough?: No Shortness of breath?: No  Endocrine Excessive thirst?: No  Musculoskeletal Back pain?: No Joint pain?: No  Neurological Headaches?: No Dizziness?: No  Psychologic Depression?: No Anxiety?: No  Physical Exam: BP (!) 191/91 (BP Location: Left Arm, Patient Position: Sitting, Cuff Size: Normal)   Pulse 88   Ht 5\' 11"  (1.803 m)   Wt 205 lb 6.4 oz (93.2 kg)   BMI 28.65 kg/m   Constitutional:  Alert and oriented, No acute distress. HEENT: [place] AT, moist mucus membranes.  Trachea midline, no masses. Cardiovascular: No clubbing, cyanosis, or edema. RRR Respiratory: Normal respiratory effort, no increased work of breathing. Lungs clear GI: Abdomen is soft, nontender, nondistended, no abdominal masses GU: Left  CVA tenderness.  Skin: No rashes, bruises or suspicious lesions. Lymph: No cervical or inguinal adenopathy. Neurologic: Grossly intact, no focal deficits, moving all 4 extremities. Psychiatric: Normal mood and affect.  Laboratory Data: RecentLabs       Lab Results  Component Value Date   WBC 8.9 10/19/2016   HGB 13.6 10/19/2016   HCT 40.0 10/19/2016   MCV 88.3 10/19/2016   PLT 181 10/19/2016      RecentLabs       Lab Results  Component Value Date   CREATININE 1.47 (H) 10/19/2016      RecentLabs       Lab Results  Component Value Date   PSA 0.10 10/19/2016   PSA 0.02 06/28/2016   PSA 0.06 03/07/2016      RecentLabs       Lab Results  Component Value Date   TESTOSTERONE <3 (L) 10/20/2015      RecentLabs       Lab Results  Component Value Date   HGBA1C 5.5 01/30/2014      Urinalysis Labs(Brief)          Component Value Date/Time   COLORURINE YELLOW 01/13/2014 2146   APPEARANCEUR Clear  04/06/2016 1126   LABSPEC 1.017 01/13/2014 2146   PHURINE 5.0 01/13/2014 2146   GLUCOSEU Negative 04/06/2016 1126   HGBUR LARGE (A) 01/13/2014 2146   BILIRUBINUR Negative 04/06/2016 [place] 01/13/2014 2146   PROTEINUR Negative 04/06/2016 1126   PROTEINUR 100 (A) 01/13/2014 2146   UROBILINOGEN 0.2 01/13/2014 2146   NITRITE Negative 04/06/2016 1126   NITRITE NEGATIVE 01/13/2014 2146   LEUKOCYTESUR Negative 04/06/2016 1126      Assessment & Plan:    1. Left hydronephrosis Left ureteral stent exchange today  2. Prostate cancer Continue Lupron and Xgeva per medical oncology  3. Right nonobstructing stone Continue conservative management at this point.  Nickie Retort, MD  [facility] [address] [place],  [address] [phone]

## 2017-05-13 ENCOUNTER — Encounter: Payer: Self-pay | Admitting: Urology

## 2017-06-11 ENCOUNTER — Emergency Department: Payer: Medicare PPO

## 2017-06-11 ENCOUNTER — Emergency Department
Admission: EM | Admit: 2017-06-11 | Discharge: 2017-06-11 | Disposition: A | Discharge status: Home / Self Care | Payer: Medicare PPO | Attending: Emergency Medicine | Admitting: Emergency Medicine

## 2017-06-11 ENCOUNTER — Encounter: Payer: Self-pay | Admitting: Emergency Medicine

## 2017-06-11 DIAGNOSIS — Z87891 Personal history of nicotine dependence: Secondary | ICD-10-CM | POA: Diagnosis not present

## 2017-06-11 DIAGNOSIS — Z79899 Other long term (current) drug therapy: Secondary | ICD-10-CM | POA: Diagnosis not present

## 2017-06-11 DIAGNOSIS — E119 Type 2 diabetes mellitus without complications: Secondary | ICD-10-CM | POA: Insufficient documentation

## 2017-06-11 DIAGNOSIS — Z8546 Personal history of malignant neoplasm of prostate: Secondary | ICD-10-CM | POA: Insufficient documentation

## 2017-06-11 DIAGNOSIS — M5413 Radiculopathy, cervicothoracic region: Secondary | ICD-10-CM | POA: Insufficient documentation

## 2017-06-11 DIAGNOSIS — M542 Cervicalgia: Secondary | ICD-10-CM | POA: Diagnosis present

## 2017-06-11 DIAGNOSIS — M25512 Pain in left shoulder: Secondary | ICD-10-CM

## 2017-06-11 DIAGNOSIS — E041 Nontoxic single thyroid nodule: Secondary | ICD-10-CM | POA: Diagnosis not present

## 2017-06-11 DIAGNOSIS — I252 Old myocardial infarction: Secondary | ICD-10-CM | POA: Insufficient documentation

## 2017-06-11 DIAGNOSIS — I110 Hypertensive heart disease with heart failure: Secondary | ICD-10-CM | POA: Insufficient documentation

## 2017-06-11 DIAGNOSIS — I5032 Chronic diastolic (congestive) heart failure: Secondary | ICD-10-CM | POA: Diagnosis not present

## 2017-06-11 LAB — URINALYSIS, COMPLETE (UACMP) WITH MICROSCOPIC
BACTERIA UA: NONE SEEN
BILIRUBIN URINE: NEGATIVE
Glucose, UA: NEGATIVE mg/dL
HGB URINE DIPSTICK: NEGATIVE
KETONES UR: NEGATIVE mg/dL
LEUKOCYTES UA: NEGATIVE
NITRITE: NEGATIVE
Protein, ur: NEGATIVE mg/dL
SPECIFIC GRAVITY, URINE: 1.012 (ref 1.005–1.030)
Squamous Epithelial / LPF: NONE SEEN
pH: 6 (ref 5.0–8.0)

## 2017-06-11 LAB — BASIC METABOLIC PANEL
Anion gap: 12 (ref 5–15)
BUN: 24 mg/dL — ABNORMAL HIGH (ref 6–20)
CALCIUM: 10.8 mg/dL — AB (ref 8.9–10.3)
CO2: 22 mmol/L (ref 22–32)
CREATININE: 1.21 mg/dL (ref 0.61–1.24)
Chloride: 105 mmol/L (ref 101–111)
GFR, EST NON AFRICAN AMERICAN: 57 mL/min — AB (ref >60)
GLUCOSE: 124 mg/dL — AB (ref 65–99)
Potassium: 4 mmol/L (ref 3.5–5.1)
Sodium: 139 mmol/L (ref 135–145)

## 2017-06-11 LAB — CBC
HEMATOCRIT: 46.2 % (ref 40.0–52.0)
Hemoglobin: 15 g/dL (ref 13.0–18.0)
MCH: 28.4 pg (ref 26.0–34.0)
MCHC: 32.6 g/dL (ref 32.0–36.0)
MCV: 87.3 fL (ref 80.0–100.0)
PLATELETS: 232 10*3/uL (ref 150–440)
RBC: 5.3 MIL/uL (ref 4.40–5.90)
RDW: 16.1 % — AB (ref 11.5–14.5)
WBC: 15.7 10*3/uL — ABNORMAL HIGH (ref 3.8–10.6)

## 2017-06-11 LAB — TROPONIN I: Troponin I: <0.03 ng/mL (ref <0.03)

## 2017-06-11 MED ORDER — MORPHINE SULFATE (PF) 4 MG/ML IV SOLN
4.0000 mg | Freq: Once | INTRAVENOUS | Status: AC
  Administered 2017-06-11: 4 mg via INTRAVENOUS
  Filled 2017-06-11: qty 1

## 2017-06-11 MED ORDER — SODIUM CHLORIDE 0.9 % IV BOLUS (SEPSIS)
500.0000 mL | Freq: Once | INTRAVENOUS | Status: AC
  Administered 2017-06-11: 500 mL via INTRAVENOUS

## 2017-06-11 MED ORDER — IOPAMIDOL (ISOVUE-370) INJECTION 76%
100.0000 mL | Freq: Once | INTRAVENOUS | Status: AC | PRN
  Administered 2017-06-11: 100 mL via INTRAVENOUS
  Filled 2017-06-11: qty 100

## 2017-06-11 NOTE — Discharge Instructions (Signed)
You have been seen in the Emergency Department (ED) today for pain in the left neck that shoots to your left upper shoulder area. Your tests in the ER do not show an obvious cause.  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  Return to the Emergency Department (ED) if you experience any chest pain/pressure/tightness, difficulty breathing, or sudden sweating, severe worsening of pain, weakness, numbness, muscle weakness, a cold or blue hand, or other symptoms that concern you.

## 2017-06-11 NOTE — ED Provider Notes (Signed)
[facility] Emergency Department Provider Note   ____________________________________________   First MD Initiated Contact with Patient 06/11/17 1612     (approximate)  I have reviewed the triage vital signs and the nursing notes.   HISTORY  Chief Complaint Neck Pain    HPI [name] is a 76 y.o. 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  Patient reports that for the last 2-3 days has had pain in the back of his left neck that shoots out is a sharp pain over the area of his left shoulder blade.  Reports it seems to start just at the base of his neck and causes a sharp lancinating pain that runs down towards his left arm.  No chest pain.  No cough.  No trouble breathing.  Denies any numbness or weakness in the arms or legs, except for some slight weakness in his left lower leg which he reports is previously been diagnosed and was attributed to problems with his prostate cancer  No headache.  No nausea or vomiting.  No other symptoms.  Reports the pain is severe a 8-9 out of 10.  Past Medical History:  Diagnosis Date  . Aortic stenosis, moderate 01/30/2014  . Arthritis   . Atrial flutter ([employer]) 02/02/2014  . Diabetes mellitus without complication (HCC)    diet control  . GERD (gastroesophageal reflux disease)   . Heart murmur   . Heart murmur   . HTN (hypertension) 01/30/2014  . Hypercholesteremia   . Hypertension   . Prostate cancer ([place])    metastatic  . Weakness of left leg     Patient Active Problem List   Diagnosis Date Noted  . Prostate cancer metastatic to bone ([place]) 04/11/2015  . Atrial flutter ([place]) 02/02/2014  . Dysphagia 01/30/2014  . HTN (hypertension) 01/30/2014  . Other and unspecified hyperlipidemia 01/30/2014  . Overweight 01/30/2014  . Aortic stenosis, moderate 01/30/2014  . Chronic diastolic heart failure ([place]) 01/30/2014  . NSTEMI (non-ST elevated myocardial infarction) ([place]) 01/30/2014  . Toe  ulcer ([place]) 01/30/2014  . Severe sepsis ([place]) 01/14/2014  . Acute renal failure ([place]) 01/14/2014  . Metabolic acidosis 56/25/6389  . Hyperkalemia 01/14/2014  . Thrombocytopenia ([place]) 01/14/2014  . Anemia 01/14/2014  . Aspiration pneumonia ([place]) 01/14/2014  . Diabetes mellitus ([place]) 01/14/2014    Past Surgical History:  Procedure Laterality Date  . CATARACT EXTRACTION W/ INTRAOCULAR LENS IMPLANT Bilateral   . COLON RESECTION    . CYSTOSCOPY W/ RETROGRADES Left 06/08/2015   Procedure: CYSTOSCOPY WITH RETROGRADE PYELOGRAM;  Surgeon: Nickie Retort, MD;  Location: [facility] ORS;  Service: Urology;  Laterality: Left;  . CYSTOSCOPY W/ URETERAL STENT PLACEMENT Left 10/05/2015   Procedure: CYSTOSCOPY WITH STENT REPLACEMENT;  Surgeon: Nickie Retort, MD;  Location: [facility] OR[facility];  Service: Urology;  Laterality: Left;  . CYSTOSCOPY W/ URETERAL STENT PLACEMENT Left 01/04/2016   Procedure: CYSTOSCOPY WITH STENT REPLACEMENT;  Surgeon: Nickie Retort, MD;  Location: [facility] OR[facility];  Service: Urology;  Laterality: Left;  . CYSTOSCOPY W/ URETERAL STENT PLACEMENT Left 04/11/2016   Procedure: CYSTOSCOPY WITH STENT REPLACEMENT;  Surgeon: Nickie Retort, MD;  Location: [facility] OR[facility];  Service: Urology;  Laterality: Left;  . CYSTOSCOPY W/ URETERAL STENT PLACEMENT Left 07/18/2016   Procedure: CYSTOSCOPY WITH STENT REPLACEMENT;  Surgeon: Nickie Retort, MD;  Location: [facility];  Service: Urology;  Laterality: Left;  . CYSTOSCOPY W/ URETERAL STENT PLACEMENT Left 11/02/2016   Procedure: CYSTOSCOPY WITH STENT REPLACEMENT;  Surgeon: Nickie Retort, MD;  Location: [facility];  Service: Urology;  Laterality: Left;  . CYSTOSCOPY W/ URETERAL STENT PLACEMENT Left 02/08/2017   Procedure: CYSTOSCOPY WITH STENT REPLACEMENT;  Surgeon: Nickie Retort, MD;  Location: [facility];  Service: Urology;  Laterality: Left;  . CYSTOSCOPY W/ URETERAL STENT PLACEMENT Left 05/10/2017   Procedure: CYSTOSCOPY WITH STENT REPLACEMENT;   Surgeon: Nickie Retort, MD;  Location: [facility];  Service: Urology;  Laterality: Left;  . EYE SURGERY Bilateral Sept and Oct.2012   cataract  . HERNIA REPAIR    . PROSTATECTOMY  1994    Prior to Admission medications   Medication Sig Start Date End Date Taking? Authorizing Provider  acetaminophen (TYLENOL) 500 MG tablet Take 1,000 mg by mouth at bedtime.    [provider]  allopurinol (ZYLOPRIM) 300 MG tablet Take 300 mg by mouth every morning.  01/07/14   [provider]  Ascorbic Acid (VITAMIN C) 100 MG tablet Take 500 mg by mouth daily.    [provider]  aspirin 81 MG tablet Take 81 mg by mouth every morning. Reported on 01/03/2016    [provider]  atorvastatin (LIPITOR) 10 MG tablet Take 10 mg by mouth daily.    [provider]  carvedilol (COREG) 6.25 MG tablet Take 1 tablet (6.25 mg total) by mouth 2 (two) times daily with a meal. 02/11/14   Reyne Dumas, MD  cephALEXin (KEFLEX) 500 MG capsule Take 1 capsule (500 mg total) by mouth 3 (three) times daily. 05/10/17   Nickie Retort, MD  ERGOCALCIFEROL PO Take 1 capsule by mouth daily. Tuesday     [provider]  ferrous sulfate 325 (65 FE) MG tablet Take 325 mg by mouth daily with breakfast.     [provider]  oxybutynin (DITROPAN) 5 MG tablet Take 5 mg by mouth 4 (four) times daily. 01/04/14   [provider]  oxyCODONE-acetaminophen (PERCOCET) 7.5-325 MG tablet Take 1 tablet by mouth every 8 (eight) hours as needed for severe pain. 04/25/17 04/25/18  Cammie Sickle, MD  pantoprazole (PROTONIX) 40 MG tablet Take 1 tablet (40 mg total) by mouth daily before breakfast. 02/11/14   Reyne Dumas, MD  ranitidine (ZANTAC) 150 MG tablet Take 150 mg by mouth 2 (two) times daily.     [provider]  verapamil (CALAN-SR) 240 MG CR tablet Take 1 tablet (240 mg total) by mouth daily. Patient taking differently: Take 240 mg by mouth 2 (two) times daily.   02/11/14   Reyne Dumas, MD  vitamin B-12 (CYANOCOBALAMIN) 1000 MCG tablet Take 1,000 mcg by mouth daily.    [provider]    Allergies Patient has no known allergies.  Family History  Problem Relation Age of Onset  . Aneurysm Father   . Cancer Mother        breast    Social History Social History  Substance Use Topics  . Smoking status: Former Smoker    Packs/day: 1.50    Years: 25.00    Types: Cigarettes    Quit date: 01/21/1974  . Smokeless tobacco: Never Used  . Alcohol use No    Review of Systems Constitutional: No fever/chills Eyes: No visual changes. ENT: No sore throat.  See HPI Cardiovascular: Denies chest pain. Respiratory: Denies shortness of breath. Gastrointestinal: No abdominal pain.  No nausea, no vomiting.  No diarrhea.  No constipation. Genitourinary: Negative for dysuria. Musculoskeletal: Negative for back pain. Skin: Negative for rash. Neurological:  Negative for headaches, focal weakness or numbness.    ____________________________________________   PHYSICAL EXAM:  VITAL SIGNS: ED Triage Vitals  Enc Vitals Group     BP 06/11/17 1418 (!) 175/86     Pulse Rate 06/11/17 1418 83     Resp 06/11/17 1418 15     Temp 06/11/17 1418 97.9 F (36.6 C)     Temp Source 06/11/17 1418 Oral     SpO2 06/11/17 1418 99 %     Weight 06/11/17 1418 200 lb (90.7 kg)     Height 06/11/17 1418 5\' 11"  (1.803 m)     Head Circumference --      Peak Flow --      Pain Score 06/11/17 1417 8     Pain Loc --      Pain Edu? --      Excl. in [place]? --     Constitutional: Alert and oriented.  Hunched forward, rocking on the edge of the bed reporting pain in his left arm and left neck. Eyes: Conjunctivae are normal. Head: Atraumatic. Nose: No congestion/rhinnorhea. Mouth/Throat: Mucous membranes are moist. Neck: No stridor.  No tenderness to cervical palpation.  However he does report point tenderness at the base of the cervical spine and upper thoracic  region.  No deformity or lesion or rashes noted. Cardiovascular: Normal rate, regular rhythm. Grossly normal heart sounds.  Good peripheral circulation. Respiratory: Normal respiratory effort.  No retractions. Lungs CTAB. Gastrointestinal: Soft and nontender. No distention. Musculoskeletal: No lower extremity tenderness nor edema. RIGHT Right upper extremity demonstrates normal strength, good use of all muscles. No edema bruising or contusions of the right shoulder/upper arm, right elbow, right forearm / hand. Full range of motion of the right right upper extremity without pain. No evidence of trauma. Strong radial pulse. Intact median/ulnar/radial neuro-muscular exam.  LEFT Left upper extremity demonstrates normal strength, good use of all muscles. No edema bruising or contusions of the left shoulder/upper arm, left elbow, left forearm / hand. Full range of motion of the left  upper extremity without pain. No evidence of trauma. Strong radial pulse. Intact median/ulnar/radial neuro-muscular exam.  Right lower extremity demonstrates normal strength and sensation.  Left lower extremity he reports minimal decreased strength, reports this is chronic and unchanged.  Neurologic:  Normal speech and language. No gross focal neurologic deficits are appreciated.  Skin:  Skin is warm, dry and intact. No rash noted. Psychiatric: Mood and affect are normal. Speech and behavior are normal.  ____________________________________________   LABS (all labs ordered are listed, but only abnormal results are displayed)  Labs Reviewed  CBC - Abnormal; Notable for the following:       Result Value   WBC 15.7 (*)    RDW 16.1 (*)    All other components within normal limits  BASIC METABOLIC PANEL - Abnormal; Notable for the following:    Glucose, Bld 124 (*)    BUN 24 (*)    Calcium 10.8 (*)    GFR calc non Af Amer 57 (*)    All other components within normal limits  URINALYSIS, COMPLETE (UACMP) WITH  MICROSCOPIC - Abnormal; Notable for the following:    Color, Urine STRAW (*)    APPearance CLEAR (*)    All other components within normal limits  TROPONIN I   ____________________________________________  EKG  Reviewed and entered by me at 1750 Ventricular rate 80 QRS 110 QTC 440 Normal sinus rhythm, left anterior fascicular block without evidence of ischemia ____________________________________________  RADIOLOGY  Dg Chest 2 View  Result Date: 06/11/2017 CLINICAL DATA:  Pain in the left neck and shoulder EXAM: CHEST  2 VIEW COMPARISON:  01/19/2014 FINDINGS: Hyperinflation with mild diffuse bronchitic changes. No acute consolidation or effusion. Normal heart size. Aortic atherosclerosis. No pneumothorax. Degenerative changes of the right AC joint. Old bilateral rib fractures. IMPRESSION: Hyperinflation with bronchitic changes. Negative for edema or acute infiltrate. Electronically Signed   By: Donavan Foil M.D.   On: 06/11/2017 17:09   Ct Cervical Spine Wo Contrast  Result Date: 06/11/2017 CLINICAL DATA:  Left-sided neck and shoulder pain. History prostate cancer. No known injury. EXAM: CT CERVICAL SPINE WITHOUT CONTRAST TECHNIQUE: Multidetector CT imaging of the cervical spine was performed without intravenous contrast. Multiplanar CT image reconstructions were also generated. COMPARISON:  None. FINDINGS: Alignment: Mild facet mediated C3-4 and C4-5 anterolisthesis. Skull base and vertebrae: No acute fracture. No primary bone lesion or focal pathologic process. Soft tissues and spinal canal: No prevertebral fluid or swelling. No visible canal hematoma. 2 cm right thyroid nodule which could be followed with sonography if clinically appropriate. Disc levels: C2-3: Degenerative disc narrowing with asymmetric left facet uncovertebral spurring. Moderate left foraminal stenosis. C3-4: Degenerative disc narrowing with asymmetric left uncovertebral spurring. Facet arthropathy with advanced  spurring and sclerosis on the left. Asymmetric, moderate left foraminal stenosis. No evidence of cord impingement. C4-5: Advanced right facet arthropathy with bulky spurring. Mild anterolisthesis. Mild spondylosis. No evidence of cord impingement. Patent foramina. C5-6: Advanced degenerative disc narrowing with possible ankylosis. There is uncovertebral spurring and moderate bilateral foraminal narrowing. Posterior disc osteophyte complex causes borderline spinal stenosis. C6-7: Spondylosis and degenerative disc narrowing with small uncovertebral spurs. Patent canal and foramina. C7-T1:Mild left uncovertebral spurring.  Patent canal and foramina. Upper chest: Mild centrilobular emphysema. IMPRESSION: 1. No acute finding. 2. History of prostate cancer and abnormal bone scan. No metastatic disease identified on this CT; posterior cervical activity on bone scan 03/08/2017 may reflect patient's advanced facet arthropathy at C3-4 and C4-5. 3. Degenerative changes and foraminal stenosis as described above. No evidence of cord impingement. 4. 2 cm right thyroid nodule. Sonography could evaluate if appropriate for comorbidities. Electronically Signed   By: Monte Fantasia M.D.   On: 06/11/2017 17:04   Ct Thoracic Spine Wo Contrast  Result Date: 06/11/2017 CLINICAL DATA:  Mid back pain. Malignancy suspected. Patient has history of abnormal bone scan and prostate cancer. EXAM: CT THORACIC SPINE WITHOUT CONTRAST TECHNIQUE: Multidetector CT images of the thoracic were obtained using the standard protocol without intravenous contrast. COMPARISON:  Chest CT 03/08/2015.  Bone scan 03/08/2017. FINDINGS: Alignment: Exaggerated thoracic kyphosis. Vertebrae: 1 cm lucency in the right aspect of the T9 vertebral body that is new from prior Focal thickening with callus of the posterior right sixth rib where there was a fracture seen by chest CT in 2016. Remote posterior left eighth rib fracture that is healed. The more medial eighth  rib shows a chronic area of sclerosis near the costovertebral joint, nonspecific. Paraspinal and other soft tissues: Centrilobular emphysema that is mild. Atherosclerosis of the aorta and coronaries. Bilateral renal cysts. There is cyst partly seen stone at the left UPJ in total measuring up to 9 mm. No visible left hydronephrosis. Left renal atrophy. Disc levels: Generalized spondylosis and facet spurring. No evidence of cord impingement IMPRESSION: 1. No acute finding. 2. 1 cm lucency in the right aspect of the T9 vertebral body that is new from 2016 and may reflect a metastatic focus.  3. Hypertrophic healing of the posterior right sixth rib fracture. Although appearance suggests a pathologic fracture, a nonacute fracture was seen in this location on the 2016 chest CT. 4. Atrophic left kidney with partially visualized stone at the UPJ. No hydronephrosis of the visible upper collecting system. Electronically Signed   By: Monte Fantasia M.D.   On: 06/11/2017 17:22   Ct Angio Chest Aorta W And/or Wo Contrast  Result Date: 06/11/2017 CLINICAL DATA:  76 year old male with left-sided neck pain. No injury or trauma. No shortness of breath or chest pain. History of prostate cancer. EXAM: CT ANGIOGRAPHY CHEST WITH CONTRAST TECHNIQUE: Multidetector CT imaging of the chest was performed using the standard protocol during bolus administration of intravenous contrast. Multiplanar CT image reconstructions and MIPs were obtained to evaluate the vascular anatomy. CONTRAST:  100 cc Isovue 370 COMPARISON:  Chest radiograph dated 06/11/2017 FINDINGS: Cardiovascular: There is no cardiomegaly or pericardial effusion. There is mild atherosclerotic calcification of the thoracic aorta. No aneurysmal dilatation or evidence dissection. The origins of the great vessels of the aortic arch appear patent. No CT evidence of pulmonary embolism. Mediastinum/Nodes: Top-normal bilateral hilar and subcarinal lymph nodes. The esophagus is  grossly unremarkable. There is a 19 mm right thyroid inferior pole hypodense nodule. Further evaluation with nonemergent ultrasound recommended. There is associated mild mass effect on the upper trachea. Lungs/Pleura: There is mild centrilobular and paraseptal emphysema. No focal consolidation, pleural effusion, or pneumothorax. The central airways are patent. Upper Abdomen: Mildly thickened left adrenal gland. Partially visualized cystic lesions in the kidneys. Musculoskeletal: There is degenerative changes of the spine primarily involving the visualized lower cervical spine. Old right posterior sixth fracture with callus formation. No acute osseous pathology. Review of the MIP images confirms the above findings. IMPRESSION: 1. No acute intrathoracic pathology. No CT evidence of pulmonary embolus. 2. Mild emphysema. 3. **An incidental finding of potential clinical significance has been found. Right thyroid hypodense nodule. Further evaluation with ultrasound on a nonemergent basis recommended.** 4. Aortic Atherosclerosis (ICD10-I70.0) and Emphysema (ICD10-J43.9). The Electronically Signed   By: Anner Crete M.D.   On: 06/11/2017 22:10    ____________________________________________   PROCEDURES  Procedure(s) performed: None  Procedures  Critical Care performed: No  ____________________________________________   INITIAL IMPRESSION / ASSESSMENT AND PLAN / ED COURSE  Pertinent labs & imaging results that were available during my care of the patient were reviewed by me and considered in my medical decision making (see chart for details).  Patient presents for left neck pain.  Afebrile, no infectious symptoms and no acute neurologic symptoms.  Denies cardiac symptoms.  No pulmonary symptoms.  I suspect this may be some type of radicular pain related to the cervical thoracic spine given the lancinating nature.  No evidence of shingles by exam.  Concerned this may be neuropathic in etiology, and  given his prostate cancer I am concerned about potential bony metastasis as an etiology.  I ordered CTs of the cervical and thoracic spine to evaluate for gross abnormality as well as a chest x-ray, EKG and cardiac labs to exclude cardiac disease though I feel this presentation would be very atypical.    Clinical Course as of Jun 12 2227  Tue Jun 11, 2017  1852 Patient reports his pain is improving, is resting comfortably at this time.  In further review, somewhat unclear as the exact etiology of his pain I suspect is some type of radicular pain but could represent some type of a on radiation from  potentially aortic or upper chest disease.  I will order a CT chest with contrast to evaluate and exclude aortic or obvious chest pathology.  In addition, given the elevated white count I will obtain a urinalysis.  Patient reports chronic history of stones and issues with his left kidney, this does not appear to be an acute issue and he seems well aware of this disease which I doubt represents etiology for his left sided neck pain  [MQ]  1903 Calcium: (!) 10.8 [MQ]  1903 GFR, Est Non African American: (!) 57 [MQ]  1903 WBC: (!) 15.7 [MQ]  1903 Troponin I: <0.03 [MQ]    Clinical Course User Index [MQ] Delman Kitten, MD   Dg Chest 2 View  Result Date: 06/11/2017 CLINICAL DATA:  Pain in the left neck and shoulder EXAM: CHEST  2 VIEW COMPARISON:  01/19/2014 FINDINGS: Hyperinflation with mild diffuse bronchitic changes. No acute consolidation or effusion. Normal heart size. Aortic atherosclerosis. No pneumothorax. Degenerative changes of the right AC joint. Old bilateral rib fractures. IMPRESSION: Hyperinflation with bronchitic changes. Negative for edema or acute infiltrate. Electronically Signed   By: Donavan Foil M.D.   On: 06/11/2017 17:09   Ct Cervical Spine Wo Contrast  Result Date: 06/11/2017 CLINICAL DATA:  Left-sided neck and shoulder pain. History prostate cancer. No known injury. EXAM: CT  CERVICAL SPINE WITHOUT CONTRAST TECHNIQUE: Multidetector CT imaging of the cervical spine was performed without intravenous contrast. Multiplanar CT image reconstructions were also generated. COMPARISON:  None. FINDINGS: Alignment: Mild facet mediated C3-4 and C4-5 anterolisthesis. Skull base and vertebrae: No acute fracture. No primary bone lesion or focal pathologic process. Soft tissues and spinal canal: No prevertebral fluid or swelling. No visible canal hematoma. 2 cm right thyroid nodule which could be followed with sonography if clinically appropriate. Disc levels: C2-3: Degenerative disc narrowing with asymmetric left facet uncovertebral spurring. Moderate left foraminal stenosis. C3-4: Degenerative disc narrowing with asymmetric left uncovertebral spurring. Facet arthropathy with advanced spurring and sclerosis on the left. Asymmetric, moderate left foraminal stenosis. No evidence of cord impingement. C4-5: Advanced right facet arthropathy with bulky spurring. Mild anterolisthesis. Mild spondylosis. No evidence of cord impingement. Patent foramina. C5-6: Advanced degenerative disc narrowing with possible ankylosis. There is uncovertebral spurring and moderate bilateral foraminal narrowing. Posterior disc osteophyte complex causes borderline spinal stenosis. C6-7: Spondylosis and degenerative disc narrowing with small uncovertebral spurs. Patent canal and foramina. C7-T1:Mild left uncovertebral spurring.  Patent canal and foramina. Upper chest: Mild centrilobular emphysema. IMPRESSION: 1. No acute finding. 2. History of prostate cancer and abnormal bone scan. No metastatic disease identified on this CT; posterior cervical activity on bone scan 03/08/2017 may reflect patient's advanced facet arthropathy at C3-4 and C4-5. 3. Degenerative changes and foraminal stenosis as described above. No evidence of cord impingement. 4. 2 cm right thyroid nodule. Sonography could evaluate if appropriate for comorbidities.  Electronically Signed   By: Monte Fantasia M.D.   On: 06/11/2017 17:04   Ct Thoracic Spine Wo Contrast  Result Date: 06/11/2017 CLINICAL DATA:  Mid back pain. Malignancy suspected. Patient has history of abnormal bone scan and prostate cancer. EXAM: CT THORACIC SPINE WITHOUT CONTRAST TECHNIQUE: Multidetector CT images of the thoracic were obtained using the standard protocol without intravenous contrast. COMPARISON:  Chest CT 03/08/2015.  Bone scan 03/08/2017. FINDINGS: Alignment: Exaggerated thoracic kyphosis. Vertebrae: 1 cm lucency in the right aspect of the T9 vertebral body that is new from prior Focal thickening with callus of the posterior right sixth rib where  there was a fracture seen by chest CT in 2016. Remote posterior left eighth rib fracture that is healed. The more medial eighth rib shows a chronic area of sclerosis near the costovertebral joint, nonspecific. Paraspinal and other soft tissues: Centrilobular emphysema that is mild. Atherosclerosis of the aorta and coronaries. Bilateral renal cysts. There is cyst partly seen stone at the left UPJ in total measuring up to 9 mm. No visible left hydronephrosis. Left renal atrophy. Disc levels: Generalized spondylosis and facet spurring. No evidence of cord impingement IMPRESSION: 1. No acute finding. 2. 1 cm lucency in the right aspect of the T9 vertebral body that is new from 2016 and may reflect a metastatic focus. 3. Hypertrophic healing of the posterior right sixth rib fracture. Although appearance suggests a pathologic fracture, a nonacute fracture was seen in this location on the 2016 chest CT. 4. Atrophic left kidney with partially visualized stone at the UPJ. No hydronephrosis of the visible upper collecting system. Electronically Signed   By: Monte Fantasia M.D.   On: 06/11/2017 17:22   Ct Angio Chest Aorta W And/or Wo Contrast  Result Date: 06/11/2017 CLINICAL DATA:  76 year old male with left-sided neck pain. No injury or trauma. No  shortness of breath or chest pain. History of prostate cancer. EXAM: CT ANGIOGRAPHY CHEST WITH CONTRAST TECHNIQUE: Multidetector CT imaging of the chest was performed using the standard protocol during bolus administration of intravenous contrast. Multiplanar CT image reconstructions and MIPs were obtained to evaluate the vascular anatomy. CONTRAST:  100 cc Isovue 370 COMPARISON:  Chest radiograph dated 06/11/2017 FINDINGS: Cardiovascular: There is no cardiomegaly or pericardial effusion. There is mild atherosclerotic calcification of the thoracic aorta. No aneurysmal dilatation or evidence dissection. The origins of the great vessels of the aortic arch appear patent. No CT evidence of pulmonary embolism. Mediastinum/Nodes: Top-normal bilateral hilar and subcarinal lymph nodes. The esophagus is grossly unremarkable. There is a 19 mm right thyroid inferior pole hypodense nodule. Further evaluation with nonemergent ultrasound recommended. There is associated mild mass effect on the upper trachea. Lungs/Pleura: There is mild centrilobular and paraseptal emphysema. No focal consolidation, pleural effusion, or pneumothorax. The central airways are patent. Upper Abdomen: Mildly thickened left adrenal gland. Partially visualized cystic lesions in the kidneys. Musculoskeletal: There is degenerative changes of the spine primarily involving the visualized lower cervical spine. Old right posterior sixth fracture with callus formation. No acute osseous pathology. Review of the MIP images confirms the above findings. IMPRESSION: 1. No acute intrathoracic pathology. No CT evidence of pulmonary embolus. 2. Mild emphysema. 3. **An incidental finding of potential clinical significance has been found. Right thyroid hypodense nodule. Further evaluation with ultrasound on a nonemergent basis recommended.** 4. Aortic Atherosclerosis (ICD10-I70.0) and Emphysema (ICD10-J43.9). The Electronically Signed   By: Anner Crete M.D.   On:  06/11/2017 22:10    ----------------------------------------- 10:26 PM on 06/11/2017 -----------------------------------------  Patient resting, and is much more comfortable than presentation.  Currently awake and alert, sitting in the wheelchair requesting to be discharged and ready to go.  At this point I do not clearly know the cause of his pain, but his workup in the ER has been extensive and very reassuring.  I discussed with him this could be a presentation of something such as shingles without the rash present, may represent a pinched nerve, irritation of the nerve root.  It appears to be a radicular type of pain.  He has orthopedic follow-up coming up this week and this appears to be an  appropriate place for him to continue for follow-up.  Discussed with him and recommended that should he need additional pain medications, as I had reviewed his prescribed database, that he should discuss with Dr. Jacinto Reap of oncology via phone tomorrow.  It should be noted though that he is not requesting any prescription pain medications at this time.  Return precautions and treatment recommendations and follow-up discussed with the patient and his wife who are agreeable with the plan.   ____________________________________________   FINAL CLINICAL IMPRESSION(S) / ED DIAGNOSES  Final diagnoses:  Radiculopathy of cervicothoracic region  Thyroid nodule      NEW MEDICATIONS STARTED DURING THIS VISIT:  New Prescriptions   No medications on file     Note:  This document was prepared using Dragon voice recognition software and may include unintentional dictation errors.     Delman Kitten, MD 06/11/17 2229

## 2017-06-11 NOTE — ED Notes (Signed)
Spoke with Dr. Karma Greaser in regards to patient presentation. No orders at this time.

## 2017-06-11 NOTE — ED Notes (Signed)
Pt has pain in left side of neck and shoulder.  Sx for 4 days.  Pt states I slept on it wrong.  Pt reports pain when lifting left arm.  No chest pain or sob.  Wife states sx for 2-3 months, just worse now.  Pt alert. Speech clear.

## 2017-06-11 NOTE — ED Triage Notes (Signed)
Patient presents to ED via POV from home with c/o left sided neck and shoulder pain. Patient denies injury or trauma. Denies pain radiating anywhere. Denies CP or SOB.

## 2017-06-12 ENCOUNTER — Emergency Department
Admission: EM | Admit: 2017-06-12 | Discharge: 2017-06-12 | Disposition: A | Discharge status: Home / Self Care | Payer: Medicare PPO | Attending: Emergency Medicine | Admitting: Emergency Medicine

## 2017-06-12 ENCOUNTER — Emergency Department: Payer: Medicare PPO

## 2017-06-12 DIAGNOSIS — Z87891 Personal history of nicotine dependence: Secondary | ICD-10-CM | POA: Insufficient documentation

## 2017-06-12 DIAGNOSIS — M25552 Pain in left hip: Secondary | ICD-10-CM | POA: Diagnosis not present

## 2017-06-12 DIAGNOSIS — I5032 Chronic diastolic (congestive) heart failure: Secondary | ICD-10-CM | POA: Insufficient documentation

## 2017-06-12 DIAGNOSIS — C61 Malignant neoplasm of prostate: Secondary | ICD-10-CM

## 2017-06-12 DIAGNOSIS — E119 Type 2 diabetes mellitus without complications: Secondary | ICD-10-CM | POA: Diagnosis not present

## 2017-06-12 DIAGNOSIS — Z79899 Other long term (current) drug therapy: Secondary | ICD-10-CM | POA: Diagnosis not present

## 2017-06-12 DIAGNOSIS — C7951 Secondary malignant neoplasm of bone: Secondary | ICD-10-CM | POA: Insufficient documentation

## 2017-06-12 DIAGNOSIS — I110 Hypertensive heart disease with heart failure: Secondary | ICD-10-CM | POA: Insufficient documentation

## 2017-06-12 MED ORDER — OXYCODONE-ACETAMINOPHEN 7.5-325 MG PO TABS: 1.0000 | ORAL_TABLET | Freq: Three times a day (TID) | ORAL | 0 refills | Status: DC | PRN

## 2017-06-12 MED ORDER — HYDROMORPHONE HCL 1 MG/ML IJ SOLN
1.0000 mg | INTRAMUSCULAR | Status: AC
  Administered 2017-06-12: 1 mg via INTRAMUSCULAR
  Filled 2017-06-12: qty 1

## 2017-06-12 NOTE — Discharge Instructions (Signed)
We believe the pain you are experiencing today is an exacerbation of your chronic pain.  No new or acute cause was identified.  We refilled your chronic pain medication last prescribed about a month and a half ago by Dr. Rogue Bussing, but additional pain management needs to come from your oncology clinic.  We spoke with Dr. Grayland Ormond at the clinic and he is going to contact one of his colleagues to reach out to you and set up a close follow-up appointment.    Return to the emergency department if you develop new or worsening symptoms that concern you.

## 2017-06-12 NOTE — ED Triage Notes (Signed)
Left sided hip pain after being in ER yesterday and "being moved to that table". Pt here for neck pain yesterday, no neck pain today. Hx of pain to left hip and steroid shots to that hip.

## 2017-06-12 NOTE — ED Provider Notes (Signed)
[facility] Emergency Department Provider Note  ____________________________________________   First MD Initiated Contact with Patient 06/12/17 1839     (approximate)  I have reviewed the triage vital signs and the nursing notes.   HISTORY  Chief Complaint Hip Pain    HPI [name] is a 76 y.o. 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  He is treated by Dr. Rogue Bussing in the cancer center.  He was seen yesterday in this emergency department and received a very thorough evaluation for severe neck pain.  No specific diagnosis was identified and it was thought to be radicular symptoms.  He improved significantly after a dose of morphine.  Today he presents with severe left hip pain.  He appears to be extremely uncomfortable moaning.  He does state that he has had chronic pain in the left hip but it was better for a while but became very severe this morning.  He states that his neck pain went away after coming to the emergency department yesterday and his hip was not bothering him, but when he woke up this morning his hip was hurting very severely.  He thinks that perhaps he bumped his hip on the CT table yesterday.  Nothing makes it better and any amount of movement makes it worse.  He had a bone scan a couple of months ago which demonstrated multiple metastases in his spine but none in the hip or pelvis.  Denies fever/chills, CP, SOB, N/V, abdominal pain.  Dr. Rogue Bussing formally wrote him prescriptions for pain medication, but he has not had a prescription filled in about 7 weeks and he does not have another appointment with Dr. Rogue Bussing for another month.  Does not go to a pain clinic.  Past Medical History:  Diagnosis Date  . Aortic stenosis, moderate 01/30/2014  . Arthritis   . Atrial flutter ([place]) 02/02/2014  . Diabetes mellitus without complication (HCC)    diet control  .  GERD (gastroesophageal reflux disease)   . Heart murmur   . Heart murmur   . HTN (hypertension) 01/30/2014  . Hypercholesteremia   . Hypertension   . Prostate cancer ([place])    metastatic  . Weakness of left leg     Patient Active Problem List   Diagnosis Date Noted  . Prostate cancer metastatic to bone ([place]) 04/11/2015  . Atrial flutter ([place]) 02/02/2014  . Dysphagia 01/30/2014  . HTN (hypertension) 01/30/2014  . Other and unspecified hyperlipidemia 01/30/2014  . Overweight 01/30/2014  . Aortic stenosis, moderate 01/30/2014  . Chronic diastolic heart failure ([place]) 01/30/2014  . NSTEMI (non-ST elevated myocardial infarction) ([place]) 01/30/2014  . Toe ulcer ([place]) 01/30/2014  . Severe sepsis ([place]) 01/14/2014  . Acute renal failure ([place]) 01/14/2014  . Metabolic acidosis 79/89/2119  . Hyperkalemia 01/14/2014  . Thrombocytopenia ([place]) 01/14/2014  . Anemia 01/14/2014  . Aspiration pneumonia ([place]) 01/14/2014  . Diabetes mellitus ([place]) 01/14/2014    Past Surgical History:  Procedure Laterality Date  . CATARACT EXTRACTION W/ INTRAOCULAR LENS IMPLANT Bilateral   . COLON RESECTION    . CYSTOSCOPY W/ RETROGRADES Left 06/08/2015   Procedure: CYSTOSCOPY WITH RETROGRADE PYELOGRAM;  Surgeon: Nickie Retort, MD;  Location: [facility] OR[facility];  Service: Urology;  Laterality: Left;  . CYSTOSCOPY W/ URETERAL STENT PLACEMENT Left 10/05/2015   Procedure: CYSTOSCOPY WITH STENT REPLACEMENT;  Surgeon: Nickie Retort, MD;  Location: [facility];  Service: Urology;  Laterality: Left;  .  CYSTOSCOPY W/ URETERAL STENT PLACEMENT Left 01/04/2016   Procedure: CYSTOSCOPY WITH STENT REPLACEMENT;  Surgeon: Nickie Retort, MD;  Location: [facility];  Service: Urology;  Laterality: Left;  . CYSTOSCOPY W/ URETERAL STENT PLACEMENT Left 04/11/2016   Procedure: CYSTOSCOPY WITH STENT REPLACEMENT;  Surgeon: Nickie Retort, MD;  Location: [facility];  Service: Urology;  Laterality: Left;  . CYSTOSCOPY W/ URETERAL STENT  PLACEMENT Left 07/18/2016   Procedure: CYSTOSCOPY WITH STENT REPLACEMENT;  Surgeon: Nickie Retort, MD;  Location: [facility];  Service: Urology;  Laterality: Left;  . CYSTOSCOPY W/ URETERAL STENT PLACEMENT Left 11/02/2016   Procedure: CYSTOSCOPY WITH STENT REPLACEMENT;  Surgeon: Nickie Retort, MD;  Location: [facility];  Service: Urology;  Laterality: Left;  . CYSTOSCOPY W/ URETERAL STENT PLACEMENT Left 02/08/2017   Procedure: CYSTOSCOPY WITH STENT REPLACEMENT;  Surgeon: Nickie Retort, MD;  Location: [facility];  Service: Urology;  Laterality: Left;  . CYSTOSCOPY W/ URETERAL STENT PLACEMENT Left 05/10/2017   Procedure: CYSTOSCOPY WITH STENT REPLACEMENT;  Surgeon: Nickie Retort, MD;  Location: [facility];  Service: Urology;  Laterality: Left;  . EYE SURGERY Bilateral Sept and Oct.2012   cataract  . HERNIA REPAIR    . PROSTATECTOMY  1994    Prior to Admission medications   Medication Sig Start Date End Date Taking? Authorizing Provider  acetaminophen (TYLENOL) 500 MG tablet Take 1,000 mg by mouth at bedtime.    [provider]  allopurinol (ZYLOPRIM) 300 MG tablet Take 300 mg by mouth every morning.  01/07/14   [provider]  Ascorbic Acid (VITAMIN C) 100 MG tablet Take 500 mg by mouth daily.    [provider]  aspirin 81 MG tablet Take 81 mg by mouth every morning. Reported on 01/03/2016    [provider]  atorvastatin (LIPITOR) 10 MG tablet Take 10 mg by mouth daily.    [provider]  carvedilol (COREG) 6.25 MG tablet Take 1 tablet (6.25 mg total) by mouth 2 (two) times daily with a meal. 02/11/14   Reyne Dumas, MD  cephALEXin (KEFLEX) 500 MG capsule Take 1 capsule (500 mg total) by mouth 3 (three) times daily. 05/10/17   Nickie Retort, MD  ERGOCALCIFEROL PO Take 1 capsule by mouth daily. Tuesday     [provider]  ferrous sulfate 325 (65 FE) MG tablet Take 325 mg by mouth daily with breakfast.     [provider]  oxybutynin (DITROPAN) 5 MG tablet Take 5 mg by mouth 4 (four) times daily. 01/04/14   [provider]  oxyCODONE-acetaminophen (PERCOCET) 7.5-325 MG tablet Take 1 tablet by mouth every 8 (eight) hours as needed for severe pain. 06/12/17 06/12/18  Hinda Kehr, MD  pantoprazole (PROTONIX) 40 MG tablet Take 1 tablet (40 mg total) by mouth daily before breakfast. 02/11/14   Reyne Dumas, MD  ranitidine (ZANTAC) 150 MG tablet Take 150 mg by mouth 2 (two) times daily.     [provider]  verapamil (CALAN-SR) 240 MG CR tablet Take 1 tablet (240 mg total) by mouth daily. Patient taking differently: Take 240 mg by mouth 2 (two) times daily.  02/11/14   Reyne Dumas, MD  vitamin B-12 (CYANOCOBALAMIN) 1000 MCG tablet Take 1,000 mcg by mouth daily.    [provider]    Allergies Patient has no known allergies.  Family History  Problem Relation Age of Onset  . Aneurysm Father   . Cancer Mother  breast    Social History Social History  Substance Use Topics  . Smoking status: Former Smoker    Packs/day: 1.50    Years: 25.00    Types: Cigarettes    Quit date: 01/21/1974  . Smokeless tobacco: Never Used  . Alcohol use No    Review of Systems Constitutional: No fever/chills Eyes: No visual changes. ENT: No sore throat. Cardiovascular: Denies chest pain. Respiratory: Denies shortness of breath. Gastrointestinal: No abdominal pain.  No nausea, no vomiting.  No diarrhea.  No constipation. Genitourinary: Negative for dysuria. Musculoskeletal: Severe pain in left hip/pelvis.  Negative for neck pain.  Negative for back pain. Integumentary: Negative for rash. Neurological: Negative for headaches, focal weakness or numbness.   ____________________________________________   PHYSICAL EXAM:  VITAL SIGNS: ED Triage Vitals  Enc Vitals Group     BP 06/12/17 1630 (!) 130/101     Pulse Rate 06/12/17 1630 83     Resp 06/12/17 1630 18     Temp  06/12/17 1630 98.1 F (36.7 C)     Temp Source 06/12/17 1630 Oral     SpO2 06/12/17 1630 98 %     Weight 06/12/17 1630 90.7 kg (200 lb)     Height 06/12/17 1630 1.803 m (5\' 11" )     Head Circumference --      Peak Flow --      Pain Score 06/12/17 1629 7     Pain Loc --      Pain Edu? --      Excl. in [place]? --     Constitutional: Alert and oriented. Appears to be in severe pain from the left hip/pelvis Eyes: Conjunctivae are normal.  Head: Atraumatic. Nose: No congestion/rhinnorhea. Mouth/Throat: Mucous membranes are moist. Neck: No stridor.  No meningeal signs.   Cardiovascular: Normal rate, regular rhythm. Good peripheral circulation. Grossly normal heart sounds. Respiratory: Normal respiratory effort.  No retractions. Lungs CTAB. Gastrointestinal: Soft and nontender. No distention.  Musculoskeletal: Severe pain but no tenderness to palpation of the left buttock and left pelvis. Neurologic:  Normal speech and language. No gross focal neurologic deficits are appreciated.  Skin:  Skin is warm, dry and intact. No rash noted.   ____________________________________________   LABS (all labs ordered are listed, but only abnormal results are displayed)  Labs Reviewed - No data to display ____________________________________________  EKG  None - EKG not ordered by ED physician ____________________________________________  RADIOLOGY   Dg Hip Unilat W Or Wo Pelvis 2-3 Views Left  Result Date: 06/12/2017 CLINICAL DATA:  Acute on chronic pain. History of metastatic prostate cancer. EXAM: DG HIP (WITH OR WITHOUT PELVIS) 2-3V LEFT COMPARISON:  03/06/2017 FINDINGS: Sclerotic focus again noted in the right inferior pubic ramus. No additional bony abnormality. No fracture, subluxation or dislocation. Hip joints and SI joints are symmetric. Surgical clips in the pelvis. Left ureteral stent partially imaged. IMPRESSION: Stable sclerotic right inferior pubic ramus metastasis. No additional  bony abnormality. Electronically Signed   By: Rolm Baptise M.D.   On: 06/12/2017 19:31    ____________________________________________   PROCEDURES  Critical Care performed: No   Procedure(s) performed:   Procedures   ____________________________________________   INITIAL IMPRESSION / ASSESSMENT AND PLAN / ED COURSE  As part of my medical decision making, I reviewed the following data within the [place] notes reviewed and incorporated, Labs reviewed , Radiograph reviewed , Notes from prior ED visits and  Controlled Substance Database    I suspect the  patient is having acute on chronic pain due to his chronic pain not being adequately controlled given that he ran out of his narcotic prescription about 7 weeks ago.  He had an extremely extensive evaluation yesterday that revealed no acute issues.  His physical exam today is reassuring.  Differential diagnosis includes pathologic fracture, lytic lesion, new tumor growth in his left pelvis etc., but I think more likely he needs acute on chronic pain treatment.  I will give him an intramuscular injection of Dilaudid and touch base with the on-call provider for the oncology clinic to work out an outpatient follow-up plan.  The [place] controlled substance database reveals that he prescriptions since his last prescription from Dr. Rogue Bussing about a month and a half ago.  Clinical Course as of Jun 12 2212  Wed Jun 12, 2017  2013 Patient feels much better after the Dilaudid injection.  X-rays are unremarkable.  I spoke by phone with Dr. Grayland Ormond with the oncology service.  I explained the situation and my impression that the patient needs a little bit closer follow-up for chronic pain management.  He agreed and said that he would pass along the information to 1 of their physician assistant to his job but is to follow-up with the ongoing patients for things like pain management.  He also said he thought it would be okay  if I refilled the prescription for Percocet that Dr. Rogue Bussing last wrote about 6 or 7 weeks ago.  I have updated the patient and his wife and they understand and agree with the plan and will follow-up after they are contacted by the oncology center.  I gave my usual and customary return precautions.  [CF]    Clinical Course User Index [CF] Hinda Kehr, MD    ____________________________________________  FINAL CLINICAL IMPRESSION(S) / ED DIAGNOSES  Final diagnoses:  Left hip pain     MEDICATIONS GIVEN DURING THIS VISIT:  Medications  HYDROmorphone (DILAUDID) injection 1 mg (1 mg Intramuscular Given 06/12/17 1922)     NEW OUTPATIENT MEDICATIONS STARTED DURING THIS VISIT:  Discharge Medication List as of 06/12/2017  8:19 PM      Discharge Medication List as of 06/12/2017  8:19 PM    CONTINUE these medications which have CHANGED   Details  oxyCODONE-acetaminophen (PERCOCET) 7.5-325 MG tablet Take 1 tablet by mouth every 8 (eight) hours as needed for severe pain., Starting Wed 06/12/2017, Until Thu 06/12/2018, Print        Discharge Medication List as of 06/12/2017  8:19 PM       Note:  This document was prepared using Dragon voice recognition software and may include unintentional dictation errors.    Hinda Kehr, MD 06/12/17 2213

## 2017-06-20 ENCOUNTER — Inpatient Hospital Stay: Payer: Medicare PPO | Attending: Internal Medicine | Admitting: Internal Medicine

## 2017-06-20 VITALS — BP 119/76 | HR 71 | Temp 97.9°F | Resp 20 | Wt 202.0 lb

## 2017-06-20 DIAGNOSIS — I350 Nonrheumatic aortic (valve) stenosis: Secondary | ICD-10-CM | POA: Insufficient documentation

## 2017-06-20 DIAGNOSIS — N1330 Unspecified hydronephrosis: Secondary | ICD-10-CM | POA: Diagnosis not present

## 2017-06-20 DIAGNOSIS — Z79818 Long term (current) use of other agents affecting estrogen receptors and estrogen levels: Secondary | ICD-10-CM | POA: Diagnosis not present

## 2017-06-20 DIAGNOSIS — C7951 Secondary malignant neoplasm of bone: Secondary | ICD-10-CM | POA: Diagnosis not present

## 2017-06-20 DIAGNOSIS — Z79899 Other long term (current) drug therapy: Secondary | ICD-10-CM | POA: Diagnosis not present

## 2017-06-20 DIAGNOSIS — I4892 Unspecified atrial flutter: Secondary | ICD-10-CM | POA: Diagnosis not present

## 2017-06-20 DIAGNOSIS — Z7982 Long term (current) use of aspirin: Secondary | ICD-10-CM | POA: Insufficient documentation

## 2017-06-20 DIAGNOSIS — K219 Gastro-esophageal reflux disease without esophagitis: Secondary | ICD-10-CM | POA: Diagnosis not present

## 2017-06-20 DIAGNOSIS — I10 Essential (primary) hypertension: Secondary | ICD-10-CM | POA: Diagnosis not present

## 2017-06-20 DIAGNOSIS — I700 Atherosclerosis of aorta: Secondary | ICD-10-CM | POA: Insufficient documentation

## 2017-06-20 DIAGNOSIS — G893 Neoplasm related pain (acute) (chronic): Secondary | ICD-10-CM | POA: Diagnosis not present

## 2017-06-20 DIAGNOSIS — R011 Cardiac murmur, unspecified: Secondary | ICD-10-CM | POA: Diagnosis not present

## 2017-06-20 DIAGNOSIS — Z87891 Personal history of nicotine dependence: Secondary | ICD-10-CM | POA: Diagnosis not present

## 2017-06-20 DIAGNOSIS — Z803 Family history of malignant neoplasm of breast: Secondary | ICD-10-CM | POA: Insufficient documentation

## 2017-06-20 DIAGNOSIS — M129 Arthropathy, unspecified: Secondary | ICD-10-CM | POA: Insufficient documentation

## 2017-06-20 DIAGNOSIS — R531 Weakness: Secondary | ICD-10-CM | POA: Diagnosis not present

## 2017-06-20 DIAGNOSIS — E119 Type 2 diabetes mellitus without complications: Secondary | ICD-10-CM | POA: Insufficient documentation

## 2017-06-20 DIAGNOSIS — C61 Malignant neoplasm of prostate: Secondary | ICD-10-CM | POA: Diagnosis not present

## 2017-06-20 DIAGNOSIS — E7800 Pure hypercholesterolemia, unspecified: Secondary | ICD-10-CM | POA: Diagnosis not present

## 2017-06-20 DIAGNOSIS — R634 Abnormal weight loss: Secondary | ICD-10-CM | POA: Insufficient documentation

## 2017-06-20 DIAGNOSIS — E041 Nontoxic single thyroid nodule: Secondary | ICD-10-CM | POA: Insufficient documentation

## 2017-06-20 DIAGNOSIS — J439 Emphysema, unspecified: Secondary | ICD-10-CM | POA: Insufficient documentation

## 2017-06-20 MED ORDER — FENTANYL 25 MCG/HR TD PT72: 25.0000 ug | MEDICATED_PATCH | TRANSDERMAL | 0 refills | Status: DC

## 2017-06-20 MED ORDER — PREDNISONE 20 MG PO TABS: 20.0000 mg | ORAL_TABLET | Freq: Every day | ORAL | 0 refills | Status: DC

## 2017-06-20 NOTE — Progress Notes (Signed)
[place] OFFICE PROGRESS NOTE  [name] Care Team: Tamsen Roers, MD as PCP - General (Family Medicine)   SUMMARY OF ONCOLOGIC HISTORY:  Oncology History   # AUG 2016- METASTATIC PROSTATE CA/ STAGE IV; Castrate sensitive [PSA- 48] ; mets- lumbar spine [s/p pal RT to Aug 2016; Dr.Crystal] /Pelvic LN; 1994- Prostate CA s/p Surgery [facility]]; Lupron q4 M; MARCH 2017- Bone scan- L4/Right pubic rami uptake; PSA- 0.1/testosterone-castrate [<3]  # Bone lesions on denosumab;DEC 2016- Recm q 42M  # Left hydronephrosis s/p stenting     Prostate cancer metastatic to bone [facility])    INTERVAL HISTORY:  A very pleasant 76 year old male [name] with above history of prostate cancer stage IV hormone sensitive currently on Lupron every 4 months returns to clinic today for continued concerns of left hip and neck pain.   In the interim, Timothy has seen orthopedics and had 2 visits to the ER for this pain. Pain continues to be rated 9/10 with minimal relief from Percocet every 6-8 hours. Timothy reports that ortho had recommended further evaluation by oncology for possible pain associated with metastases but that injections to his neck could be considered. Timothy says the pain medication makes him sleepy and helps with the pain for 1-2 hours. Timothy also uses otc patches, topical heat and cold. Timothy denies improvement in changing positions and describes pain as 'constant discomfort'. Standing and/or walking worsens pain.   Timothy denies weight loss, chest pain, shortness of breath, or cough. Timothy reports some additional denture pain and discomfort with his dentures and says Timothy has been unable to get back to his dentist. Timothy is requesting refill of his magic mouthwash until his dental appointment.   REVIEW OF SYSTEMS:  A complete 10 point review of system is done which is negative except mentioned above/history of present illness.   PAST MEDICAL HISTORY :  Past Medical History:  Diagnosis Date  . Aortic stenosis,  moderate 01/30/2014  . Arthritis   . Atrial flutter ([place]) 02/02/2014  . Diabetes mellitus without complication (HCC)    diet control  . GERD (gastroesophageal reflux disease)   . Heart murmur   . Heart murmur   . HTN (hypertension) 01/30/2014  . Hypercholesteremia   . Hypertension   . Prostate cancer ([place])    metastatic  . Weakness of left leg     PAST SURGICAL HISTORY :   Past Surgical History:  Procedure Laterality Date  . CATARACT EXTRACTION W/ INTRAOCULAR LENS IMPLANT Bilateral   . COLON RESECTION    . CYSTOSCOPY W/ RETROGRADES Left 06/08/2015   Procedure: CYSTOSCOPY WITH RETROGRADE PYELOGRAM;  Surgeon: Nickie Retort, MD;  Location: [facility] ORS;  Service: Urology;  Laterality: Left;  . CYSTOSCOPY W/ URETERAL STENT PLACEMENT Left 10/05/2015   Procedure: CYSTOSCOPY WITH STENT REPLACEMENT;  Surgeon: Nickie Retort, MD;  Location: [facility] OR[facility];  Service: Urology;  Laterality: Left;  . CYSTOSCOPY W/ URETERAL STENT PLACEMENT Left 01/04/2016   Procedure: CYSTOSCOPY WITH STENT REPLACEMENT;  Surgeon: Nickie Retort, MD;  Location: [facility] OR[facility];  Service: Urology;  Laterality: Left;  . CYSTOSCOPY W/ URETERAL STENT PLACEMENT Left 04/11/2016   Procedure: CYSTOSCOPY WITH STENT REPLACEMENT;  Surgeon: Nickie Retort, MD;  Location: [facility] OR[facility];  Service: Urology;  Laterality: Left;  . CYSTOSCOPY W/ URETERAL STENT PLACEMENT Left 07/18/2016   Procedure: CYSTOSCOPY WITH STENT REPLACEMENT;  Surgeon: Nickie Retort, MD;  Location: [facility] OR[facility];  Service: Urology;  Laterality: Left;  . CYSTOSCOPY W/ URETERAL STENT  PLACEMENT Left 11/02/2016   Procedure: CYSTOSCOPY WITH STENT REPLACEMENT;  Surgeon: Nickie Retort, MD;  Location: [facility];  Service: Urology;  Laterality: Left;  . CYSTOSCOPY W/ URETERAL STENT PLACEMENT Left 02/08/2017   Procedure: CYSTOSCOPY WITH STENT REPLACEMENT;  Surgeon: Nickie Retort, MD;  Location: [facility];  Service: Urology;  Laterality: Left;  . CYSTOSCOPY W/ URETERAL  STENT PLACEMENT Left 05/10/2017   Procedure: CYSTOSCOPY WITH STENT REPLACEMENT;  Surgeon: Nickie Retort, MD;  Location: [facility] OR[facility];  Service: Urology;  Laterality: Left;  . EYE SURGERY Bilateral Sept and Oct.2012   cataract  . HERNIA REPAIR    . PROSTATECTOMY  1994    FAMILY HISTORY :   Family History  Problem Relation Age of Onset  . Aneurysm Father   . Cancer Mother        breast    SOCIAL HISTORY:   Social History  Substance Use Topics  . Smoking status: Former Smoker    Packs/day: 1.50    Years: 25.00    Types: Cigarettes    Quit date: 01/21/1974  . Smokeless tobacco: Never Used  . Alcohol use No    ALLERGIES:  has No Known Allergies.  MEDICATIONS:  Current Outpatient Prescriptions  Medication Sig Dispense Refill  . allopurinol (ZYLOPRIM) 300 MG tablet Take 300 mg by mouth every morning.     . Ascorbic Acid (VITAMIN C) 100 MG tablet Take 500 mg by mouth daily.    Marland Kitchen aspirin 81 MG tablet Take 81 mg by mouth every morning. Reported on 01/03/2016    . atorvastatin (LIPITOR) 10 MG tablet Take 10 mg by mouth daily.    . carvedilol (COREG) 6.25 MG tablet Take 1 tablet (6.25 mg total) by mouth 2 (two) times daily with a meal. 60 tablet 2  . ERGOCALCIFEROL PO Take 1 capsule by mouth daily. Tuesday     . ferrous sulfate 325 (65 FE) MG tablet Take 325 mg by mouth daily with breakfast.     . oxybutynin (DITROPAN) 5 MG tablet Take 5 mg by mouth 4 (four) times daily.    Marland Kitchen oxyCODONE-acetaminophen (PERCOCET) 7.5-325 MG tablet Take 1 tablet by mouth every 8 (eight) hours as needed for severe pain. 90 tablet 0  . pantoprazole (PROTONIX) 40 MG tablet Take 1 tablet (40 mg total) by mouth daily before breakfast. 30 tablet 2  . ranitidine (ZANTAC) 150 MG tablet Take 150 mg by mouth 2 (two) times daily.     . verapamil (CALAN-SR) 240 MG CR tablet Take 1 tablet (240 mg total) by mouth daily. ([name] taking differently: Take 240 mg by mouth 2 (two) times daily. ) 30 tablet 2  . vitamin B-12  (CYANOCOBALAMIN) 1000 MCG tablet Take 1,000 mcg by mouth daily.    Marland Kitchen acetaminophen (TYLENOL) 500 MG tablet Take 1,000 mg by mouth at bedtime.    . fentaNYL (DURAGESIC - DOSED MCG/HR) 25 MCG/HR patch Place 1 patch (25 mcg total) onto the skin every 3 (three) days. 5 patch 0  . predniSONE (DELTASONE) 20 MG tablet Take 1 tablet (20 mg total) by mouth daily with breakfast. 14 tablet 0   Current Facility-Administered Medications  Medication Dose Route Frequency Provider Last Rate Last Dose  . morphine 2 MG/ML injection 2 mg  2 mg Intravenous Once Cammie Sickle, MD       Facility-Administered Medications Ordered in Other Visits  Medication Dose Route Frequency Provider Last Rate Last Dose  . 0.9 %  sodium chloride infusion  Intravenous Continuous Wylene Simmer, MD        PHYSICAL EXAMINATION: ECOG PERFORMANCE STATUS: 0 - Asymptomatic  BP 119/76 (BP Location: Left Arm, [name] Position: Sitting)   Pulse 71   Temp 97.9 F (36.6 C) (Tympanic)   Resp 20   Wt 202 lb (91.6 kg)   BMI 28.17 kg/m   Filed Weights   06/20/17 1402  Weight: 202 lb (91.6 kg)    GENERAL: Well-nourished well-developed; Alert, no distress and comfortable.  Accompanied by his wife. [name] is in a wheelchair because of left hip pain. EYES: no pallor or icterus OROPHARYNX: no thrush or ulceration; dentures. NECK: supple, no masses felt LYMPH: no palpable lymphadenopathy in the cervical, axillary or inguinal regions LUNGS: clear to auscultation and  No wheeze or crackles HEART/CVS: regular rate & rhythm and no murmurs; No lower extremity edema ABDOMEN: abdomen soft, non-tender and normal bowel sounds Musculoskeletal:no cyanosis of digits and no clubbing. Localizes pain posteriorly left ilium/iliac crests. Pain is reproducible with palpation. Unable to bear full weight on the left hip. Ambulation compromised PSYCH: alert & oriented x 3 with fluent speech NEURO: no focal motor/sensory deficits SKIN:  no rashes  or significant lesions  LABORATORY DATA:  I have reviewed the data as listed    Component Value Date/Time   NA 139 06/11/2017 1729   K 4.0 06/11/2017 1729   CL 105 06/11/2017 1729   CO2 22 06/11/2017 1729   GLUCOSE 124 (H) 06/11/2017 1729   BUN 24 (H) 06/11/2017 1729   CREATININE 1.21 06/11/2017 1729   CALCIUM 10.8 (H) 06/11/2017 1729   PROT 7.3 02/25/2017 1110   ALBUMIN 3.9 02/25/2017 1110   AST 21 02/25/2017 1110   ALT 15 (L) 02/25/2017 1110   ALKPHOS 65 02/25/2017 1110   BILITOT 0.3 02/25/2017 1110   GFRNONAA 57 (L) 06/11/2017 1729   GFRAA >60 06/11/2017 1729    No results found for: SPEP, UPEP  Lab Results  Component Value Date   WBC 15.7 (H) 06/11/2017   NEUTROABS 6.9 (H) 02/25/2017   HGB 15.0 06/11/2017   HCT 46.2 06/11/2017   MCV 87.3 06/11/2017   PLT 232 06/11/2017      Chemistry      Component Value Date/Time   NA 139 06/11/2017 1729   K 4.0 06/11/2017 1729   CL 105 06/11/2017 1729   CO2 22 06/11/2017 1729   BUN 24 (H) 06/11/2017 1729   CREATININE 1.21 06/11/2017 1729      Component Value Date/Time   CALCIUM 10.8 (H) 06/11/2017 1729   ALKPHOS 65 02/25/2017 1110   AST 21 02/25/2017 1110   ALT 15 (L) 02/25/2017 1110   BILITOT 0.3 02/25/2017 1110     Results for YANN, BIEHN (MRN [id number]) as of 03/11/2017 13:41  Ref. Range 11/21/2015 13:31 03/07/2016 14:55 06/28/2016 13:40 10/19/2016 13:35 02/25/2017 11:10  PSA Latest Ref Range: 0.00 - 4.00 ng/mL 0.12 0.06 0.02 0.10   Prostatic Specific Antigen Latest Ref Range: 0.00 - 4.00 ng/mL     0.39    IMPRESSION: Areas of bony metastatic disease in the lumbar spine, probable lower thoracic spine, and right ischium. There appears to be an increase in abnormal uptake in the lower thoracic spinous process regions at T9 and T10 as well as in portions of the L3 and L5 vertebrae compared the prior study. Radiographic correlation advised to confirm progression of bony metastatic disease in these areas. The abnormal  radiotracer uptake at L4 and in the right  ischium are stable. Old rib fractures show stable uptake bilaterally. Multiple areas of arthropathy noted. Fullness of the left renal collecting system has been documented previously and accounts for the decreased uptake in the left kidney compared to the right, a stable finding.  Electronically Signed   By: Lowella Grip III M.D.   On: 03/08/2017 13:01   IMPRESSION: 1. No acute intrathoracic pathology. No CT evidence of pulmonary embolus. 2. Mild emphysema. 3. **An incidental finding of potential clinical significance has been found. Right thyroid hypodense nodule. Further evaluation with ultrasound on a nonemergent basis recommended.** 4. Aortic Atherosclerosis (ICD10-I70.0) and Emphysema (ICD10-J43.9).  Electronically Signed   By: Anner Crete M.D.   On: 06/11/2017 22:10   IMPRESSION: Stable sclerotic right inferior pubic ramus metastasis. No additional bony abnormality.  Electronically Signed   By: Rolm Baptise M.D.   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 Last psa July 2018- 0.3/which is slightly rising. [name] may be transitioning to castrate resistant phase. PSA at next appointment.    #Left hip pain- [name] continues to be obviously uncomfortable and with minimal relief. Bone scan demonstrates multiple areas of metastasis is fine and concerns for metastasis in L4, L5, and right hip. I am concerned that this pain may be originating from another area of metastasis not well viewed on bone scan or CT. MRI from  2016 was more revealing regarding areas of metastasis of osseous tissue. Will send [name] for MRI of thoracic and lumbar spi with and without contrast. Discussed with [name] that Timothy will need to take pain medication prior to imaging in order to  tolerate laying flat. [name] agrees. Feel [name] would benefit from imaging of his neck but due to concerns or duration of scan will limit this imaging to thoracic and lumbar. Given [name]'s frequent usage of oral pain medication, we'll start on fentanyl 25 mcg patch for [name]-acting pain control. [name] can continue when necessary Percocet. Advised [name] not to exceed 3 g of Tylenol per 24-hour period. Will also provide [name] 2 weeks of oral prednisone 20 mg.  #bone mets- last dose of Xgeva given 02/27/17. If hip pain is r/t bone mets, could consider palliative radiation to the area.   #Mouth pain- [name] complains mouth pain which she has accounted too ill fitting dentures. [name] has been evaluated and is followed by dentist. Continued concerns for osteonecrosis of jaw secondary to [facility]. The [name] states Timothy has follow-up appointment with dentist in a few weeks. Again discussed ONJ importance of assessment by dentistry. We will monitor at this time and may consider holding Xgeva at next appointment.   # Left ureteral stent/hydronephrosis- likely from pelvic adenopathy from his malignancy/ CKD- stage III- creat 1.21 06/11/17. Labs at next appointment to re-evaluate.  # follow up second week of Nov 2018/labs-X-geva; Lupron   Orders Placed This Encounter  Procedures  . MR Lumbar Spine W Wo Contrast    Standing Status:   Future    Standing Expiration Date:   06/20/2018    Order Specific Question:   If indicated for the ordered procedure, I authorize the administration of contrast media per Radiology protocol    Answer:   Yes    Order Specific Question:   What is the [name]'s sedation requirement?    Answer:   No Sedation    Order  Specific Question:   Does the [name] have a pacemaker or implanted devices?    Answer:   No    Order Specific Question:   Radiology Contrast Protocol - do NOT remove file path    Answer:   \\[email or web address]    Order Specific  Question:   Reason for Exam additional comments    Answer:   left hip pain. hx stg iv prostate ca    Order Specific Question:   Preferred imaging location?    Answer:   [facility] (table limit-300lbs)  . MR Thoracic Spine W Wo Contrast    Standing Status:   Future    Standing Expiration Date:   06/20/2018    Order Specific Question:   GRA to provide read?    Answer:   Yes    Order Specific Question:   If indicated for the ordered procedure, I authorize the administration of contrast media per Radiology protocol    Answer:   Yes    Order Specific Question:   What is the [name]'s sedation requirement?    Answer:   No Sedation    Order Specific Question:   Does the [name] have a pacemaker or implanted devices?    Answer:   No    Order Specific Question:   Preferred imaging location?    Answer:   [facility] (table limit-300lbs)    Order Specific Question:   Radiology Contrast Protocol - do NOT remove file path    Answer:   [email or web address]    Order Specific Question:   Reason for Exam additional comments    Answer:   back & hip pain w/ hx stg iv prostate ca     Verlon Au, NP 06/20/2017 4:05 PM

## 2017-06-20 NOTE — Assessment & Plan Note (Addendum)
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 Last psa July 2018- 0.3/which is slightly rising. Patient may be transitioning to castrate resistant phase. PSA at next appointment.    #Left hip pain- patient continues to be obviously uncomfortable and with minimal relief. Bone scan demonstrates multiple areas of metastasis is fine and concerns for metastasis in L4, L5, and right hip. I am concerned that this pain may be originating from another area of metastasis not well viewed on bone scan or CT. MRI from  2016 was more revealing regarding areas of metastasis of osseous tissue. Will send patient for MRI of thoracic and lumbar spi with and without contrast. Discussed with patient that he will need to take pain medication prior to imaging in order to tolerate laying flat. Patient agrees. Feel patient would benefit from imaging of his neck but due to concerns or duration of scan will limit this imaging to thoracic and lumbar. Given patient's frequent usage of oral pain medication, we'll start on fentanyl 25 mcg patch for long-acting pain control. Patient can continue when necessary Percocet. Advised patient not to exceed 3 g of Tylenol per 24-hour period. Will also provide patient 2 weeks of oral prednisone 20 mg.  #bone mets- last dose of Xgeva given 02/27/17. If hip pain is r/t bone mets, could consider palliative radiation to the area.   #Mouth pain- patient complains mouth pain which she has accounted too ill fitting dentures. Patient has been evaluated and is followed by dentist. Continued concerns for osteonecrosis of jaw secondary to [facility]. The patient states he has follow-up appointment with dentist in a few weeks. Again discussed ONJ importance of assessment by dentistry. We will monitor at this time and may consider holding Xgeva at next appointment.   # Left ureteral stent/hydronephrosis- likely from pelvic adenopathy from his  malignancy/ CKD- stage III- creat 1.21 06/11/17. Labs at next appointment to re-evaluate.  # follow up second week of Nov 2018/labs-X-geva; Lupron

## 2017-06-20 NOTE — Progress Notes (Signed)
Patient here for follow-up prostate cancer - metastatic bone cancer. Patient reports ongoing left hip pain - rates pain 9/10. Made worse with ambulation "but much improved in intensity compared to previous ER/MD office visits". Pt c/o oral pain with denture use. He is requesting a RF on medicated agic mouthwash and 20% benzocaime gel (which he obtained by his dentist). He has not called dentist with RF request for these medications, but wanted to know if Dr. B would be willing to RF.

## 2017-06-25 ENCOUNTER — Other Ambulatory Visit: Payer: Self-pay | Admitting: Student

## 2017-06-25 DIAGNOSIS — M5412 Radiculopathy, cervical region: Secondary | ICD-10-CM

## 2017-06-27 ENCOUNTER — Ambulatory Visit
Admission: RE | Admit: 2017-06-27 | Discharge: 2017-06-27 | Disposition: A | Discharge status: Home / Self Care | Payer: Medicare PPO | Source: Ambulatory Visit | Attending: Nurse Practitioner | Admitting: Nurse Practitioner

## 2017-06-27 ENCOUNTER — Other Ambulatory Visit: Payer: Self-pay | Admitting: Nurse Practitioner

## 2017-06-27 DIAGNOSIS — M5134 Other intervertebral disc degeneration, thoracic region: Secondary | ICD-10-CM | POA: Diagnosis not present

## 2017-06-27 DIAGNOSIS — C61 Malignant neoplasm of prostate: Secondary | ICD-10-CM

## 2017-06-27 DIAGNOSIS — C7951 Secondary malignant neoplasm of bone: Secondary | ICD-10-CM | POA: Diagnosis present

## 2017-07-01 ENCOUNTER — Inpatient Hospital Stay: Payer: Medicare PPO

## 2017-07-01 DIAGNOSIS — C61 Malignant neoplasm of prostate: Secondary | ICD-10-CM | POA: Diagnosis not present

## 2017-07-01 DIAGNOSIS — C7951 Secondary malignant neoplasm of bone: Principal | ICD-10-CM

## 2017-07-01 LAB — CBC WITH DIFFERENTIAL/PLATELET
Basophils Absolute: 0.1 10*3/uL (ref 0–0.1)
Basophils Relative: 1 %
EOS ABS: 0.3 10*3/uL (ref 0–0.7)
EOS PCT: 3 %
HCT: 41.2 % (ref 40.0–52.0)
HEMOGLOBIN: 13.3 g/dL (ref 13.0–18.0)
LYMPHS ABS: 1.7 10*3/uL (ref 1.0–3.6)
LYMPHS PCT: 13 %
MCH: 28.3 pg (ref 26.0–34.0)
MCHC: 32.3 g/dL (ref 32.0–36.0)
MCV: 87.6 fL (ref 80.0–100.0)
MONOS PCT: 8 %
Monocytes Absolute: 1 10*3/uL (ref 0.2–1.0)
Neutro Abs: 9.8 10*3/uL — ABNORMAL HIGH (ref 1.4–6.5)
Neutrophils Relative %: 75 %
PLATELETS: 216 10*3/uL (ref 150–440)
RBC: 4.7 MIL/uL (ref 4.40–5.90)
RDW: 16.5 % — ABNORMAL HIGH (ref 11.5–14.5)
WBC: 12.9 10*3/uL — ABNORMAL HIGH (ref 3.8–10.6)

## 2017-07-01 LAB — COMPREHENSIVE METABOLIC PANEL
ALK PHOS: 67 U/L (ref 38–126)
ALT: 15 U/L — AB (ref 17–63)
AST: 21 U/L (ref 15–41)
Albumin: 3.7 g/dL (ref 3.5–5.0)
Anion gap: 10 (ref 5–15)
BILIRUBIN TOTAL: 0.4 mg/dL (ref 0.3–1.2)
BUN: 33 mg/dL — AB (ref 6–20)
CO2: 26 mmol/L (ref 22–32)
CREATININE: 1.27 mg/dL — AB (ref 0.61–1.24)
Calcium: 10.1 mg/dL (ref 8.9–10.3)
Chloride: 100 mmol/L — ABNORMAL LOW (ref 101–111)
GFR, EST NON AFRICAN AMERICAN: 54 mL/min — AB (ref >60)
Glucose, Bld: 166 mg/dL — ABNORMAL HIGH (ref 65–99)
Potassium: 4.3 mmol/L (ref 3.5–5.1)
Sodium: 136 mmol/L (ref 135–145)
Total Protein: 7 g/dL (ref 6.5–8.1)

## 2017-07-02 ENCOUNTER — Telehealth: Payer: Self-pay | Admitting: Nurse Practitioner

## 2017-07-02 LAB — PSA: PROSTATIC SPECIFIC ANTIGEN: 0.92 ng/mL (ref 0.00–4.00)

## 2017-07-02 NOTE — Telephone Encounter (Signed)
MRI called stating pt unable to complete scan d/t pain. Called patient to discuss & assess pain management. Left message. Would like to have patient come in for Symptom Management visit to discuss pain control and options for him to complete scan. Asked patient's nurse to try getting back in touch with patient.

## 2017-07-04 ENCOUNTER — Other Ambulatory Visit: Payer: Self-pay

## 2017-07-04 ENCOUNTER — Other Ambulatory Visit: Payer: Medicare PPO

## 2017-07-04 ENCOUNTER — Inpatient Hospital Stay (HOSPITAL_BASED_OUTPATIENT_CLINIC_OR_DEPARTMENT_OTHER): Payer: Medicare PPO | Admitting: Internal Medicine

## 2017-07-04 ENCOUNTER — Inpatient Hospital Stay: Payer: Medicare PPO

## 2017-07-04 VITALS — BP 143/81 | HR 67 | Temp 97.8°F | Resp 20 | Ht 71.0 in | Wt 200.0 lb

## 2017-07-04 DIAGNOSIS — Z803 Family history of malignant neoplasm of breast: Secondary | ICD-10-CM

## 2017-07-04 DIAGNOSIS — E7800 Pure hypercholesterolemia, unspecified: Secondary | ICD-10-CM

## 2017-07-04 DIAGNOSIS — J439 Emphysema, unspecified: Secondary | ICD-10-CM

## 2017-07-04 DIAGNOSIS — Z87891 Personal history of nicotine dependence: Secondary | ICD-10-CM

## 2017-07-04 DIAGNOSIS — Z7189 Other specified counseling: Secondary | ICD-10-CM

## 2017-07-04 DIAGNOSIS — I350 Nonrheumatic aortic (valve) stenosis: Secondary | ICD-10-CM

## 2017-07-04 DIAGNOSIS — I4892 Unspecified atrial flutter: Secondary | ICD-10-CM

## 2017-07-04 DIAGNOSIS — I700 Atherosclerosis of aorta: Secondary | ICD-10-CM

## 2017-07-04 DIAGNOSIS — Z7982 Long term (current) use of aspirin: Secondary | ICD-10-CM

## 2017-07-04 DIAGNOSIS — N1330 Unspecified hydronephrosis: Secondary | ICD-10-CM | POA: Diagnosis not present

## 2017-07-04 DIAGNOSIS — G893 Neoplasm related pain (acute) (chronic): Secondary | ICD-10-CM

## 2017-07-04 DIAGNOSIS — E119 Type 2 diabetes mellitus without complications: Secondary | ICD-10-CM | POA: Diagnosis not present

## 2017-07-04 DIAGNOSIS — C61 Malignant neoplasm of prostate: Secondary | ICD-10-CM

## 2017-07-04 DIAGNOSIS — Z79818 Long term (current) use of other agents affecting estrogen receptors and estrogen levels: Secondary | ICD-10-CM

## 2017-07-04 DIAGNOSIS — M129 Arthropathy, unspecified: Secondary | ICD-10-CM

## 2017-07-04 DIAGNOSIS — R531 Weakness: Secondary | ICD-10-CM

## 2017-07-04 DIAGNOSIS — C7951 Secondary malignant neoplasm of bone: Secondary | ICD-10-CM | POA: Diagnosis not present

## 2017-07-04 DIAGNOSIS — R011 Cardiac murmur, unspecified: Secondary | ICD-10-CM

## 2017-07-04 DIAGNOSIS — R634 Abnormal weight loss: Secondary | ICD-10-CM | POA: Diagnosis not present

## 2017-07-04 DIAGNOSIS — Z79899 Other long term (current) drug therapy: Secondary | ICD-10-CM | POA: Diagnosis not present

## 2017-07-04 DIAGNOSIS — K219 Gastro-esophageal reflux disease without esophagitis: Secondary | ICD-10-CM

## 2017-07-04 DIAGNOSIS — I10 Essential (primary) hypertension: Secondary | ICD-10-CM

## 2017-07-04 DIAGNOSIS — E041 Nontoxic single thyroid nodule: Secondary | ICD-10-CM

## 2017-07-04 MED ORDER — LEUPROLIDE ACETATE (4 MONTH) 30 MG IM KIT
30.0000 mg | PACK | Freq: Once | INTRAMUSCULAR | Status: AC
  Administered 2017-07-04: 30 mg via INTRAMUSCULAR

## 2017-07-04 MED ORDER — DENOSUMAB 120 MG/1.7ML ~~LOC~~ SOLN
120.0000 mg | Freq: Once | SUBCUTANEOUS | Status: AC
  Administered 2017-07-04: 120 mg via SUBCUTANEOUS
  Filled 2017-07-04: qty 1.7

## 2017-07-04 MED ORDER — ENZALUTAMIDE 40 MG PO CAPS: 160.0000 mg | ORAL_CAPSULE | Freq: Every day | ORAL | 0 refills | Status: DC

## 2017-07-04 MED ORDER — FENTANYL 50 MCG/HR TD PT72: 50.0000 ug | MEDICATED_PATCH | TRANSDERMAL | 0 refills | Status: DC

## 2017-07-04 NOTE — Assessment & Plan Note (Addendum)
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  MRI shows progressive lesion in his thoracic spine.  Patient unable to get his lumbar spine MRI-secondary to pain.  #Given the progression of the disease [PSA not a good marker]; I discussed multiple options including chemotherapy-docetaxel; also discussed regarding-Zytiga plus prednisone/Xtandi.  After discussing multiple pros and cons of each therapy-patient declined chemotherapy.  Patient understands treatments are palliative not curative.  # We will start the patient on Xtandi; discussed the potential increased risk of fatigue/risk of seizures.  Prescription initiated.    #Given the ongoing left hip pain-I would recommend Auximin PET scan for further evaluation.  This will help [place] target his pain with radiation  # Bone mets-Xgeva q. Monthly.  #Pain control-increase the dose of fentanyl to 50 mcg;and also continue current dose of Percocet 7.5 325 every 8 hours.  # Left ureteral stent/hydronephrosis- likely from pelvic adenopathy from his malignancy/ CKD- stage III- creat 1.3. Reviewed the labs.   # follow up in 4 week/labs/X-geva; PET 3 week PET [if pos RT referral].  # 40 minutes face-to-face with the patient discussing the above plan of care; more than 50% of time spent on prognosis/ natural history; counseling and coordination.

## 2017-07-04 NOTE — Progress Notes (Signed)
[place] OFFICE PROGRESS NOTE  Patient Care Team: Tamsen Roers, MD as PCP - General (Family Medicine)   SUMMARY OF ONCOLOGIC HISTORY:  Oncology History   # AUG 2016- METASTATIC PROSTATE CA/ STAGE IV; Castrate sensitive [PSA- 48] ; mets- lumbar spine [s/p pal RT to Aug 2016; Dr.Crystal] /Pelvic LN; 1994- Prostate CA s/p Surgery [facility]]; Lupron q4 M; MARCH 2017- Bone scan- L4/Right pubic rami uptake; PSA- 0.1/testosterone-castrate [<3]  # Bone lesions on denosumab;DEC 2016- Recm q 20M  # Left hydronephrosis s/p stenting     Prostate cancer metastatic to bone [facility])    INTERVAL HISTORY:  A very pleasant 76 year old male patient with above history of prostate cancer stage IV hormone sensitive currently on Lupron every 4 months-is here to review the results of his MRI that was ordered at last visit for his worsening left hip pain.  Patient had a thoracic spine MRI; however was not able to finish off the lumbar spine MRI because of the progressive/pain in his hips.  Patient is currently on fentanyl 25 mcg; he is also on Percocet every 6-8 hours.  He states this is not helping his pain.  Overall he continues to feel poorly.  Poor appetite.  No nausea no vomiting.  Positive for weight loss.  REVIEW OF SYSTEMS:  A complete 10 point review of system is done which is negative except mentioned above/history of present illness.   PAST MEDICAL HISTORY :  Past Medical History:  Diagnosis Date  . Aortic stenosis, moderate 01/30/2014  . Arthritis   . Atrial flutter ([place]) 02/02/2014  . Diabetes mellitus without complication (HCC)    diet control  . GERD (gastroesophageal reflux disease)   . Heart murmur   . Heart murmur   . HTN (hypertension) 01/30/2014  . Hypercholesteremia   . Hypertension   . Prostate cancer ([place])    metastatic  . Weakness of left leg     PAST SURGICAL HISTORY :   Past Surgical History:  Procedure Laterality Date  . CATARACT EXTRACTION W/  INTRAOCULAR LENS IMPLANT Bilateral   . COLON RESECTION    . CYSTOSCOPY WITH RETROGRADE PYELOGRAM Left 06/08/2015   Performed by Nickie Retort, MD at [facility] ORS  . CYSTOSCOPY WITH STENT REPLACEMENT Left 05/10/2017   Performed by Nickie Retort, MD at [facility] ORS  . CYSTOSCOPY WITH STENT REPLACEMENT Left 02/08/2017   Performed by Nickie Retort, MD at [facility] ORS  . CYSTOSCOPY WITH STENT REPLACEMENT Left 11/02/2016   Performed by Nickie Retort, MD at [facility] ORS  . CYSTOSCOPY WITH STENT REPLACEMENT Left 07/18/2016   Performed by Nickie Retort, MD at [facility] ORS  . CYSTOSCOPY WITH STENT REPLACEMENT Left 04/11/2016   Performed by Nickie Retort, MD at [facility] ORS  . CYSTOSCOPY WITH STENT REPLACEMENT Left 01/04/2016   Performed by Nickie Retort, MD at [facility] ORS  . CYSTOSCOPY WITH STENT REPLACEMENT Left 10/05/2015   Performed by Nickie Retort, MD at [facility] ORS  . EYE SURGERY Bilateral Sept and Oct.2012   cataract  . HERNIA REPAIR    . PROSTATECTOMY  1994    FAMILY HISTORY :   Family History  Problem Relation Age of Onset  . Aneurysm Father   . Cancer Mother        breast    SOCIAL HISTORY:   Social History   Tobacco Use  . Smoking status: Former Smoker    Packs/day: 1.50    Years: 25.00  Pack years: 37.50    Types: Cigarettes    Last attempt to quit: 01/21/1974    Years since quitting: 43.4  . Smokeless tobacco: Never Used  Substance Use Topics  . Alcohol use: No  . Drug use: No    ALLERGIES:  has No Known Allergies.  MEDICATIONS:  Current Outpatient Medications  Medication Sig Dispense Refill  . allopurinol (ZYLOPRIM) 300 MG tablet Take 300 mg by mouth every morning.     . Ascorbic Acid (VITAMIN C) 100 MG tablet Take 500 mg by mouth daily.    Marland Kitchen aspirin 81 MG tablet Take 81 mg by mouth every morning. Reported on 01/03/2016    . atorvastatin (LIPITOR) 10 MG tablet Take 10 mg by mouth daily.    . carvedilol (COREG) 6.25 MG tablet Take 1 tablet (6.25  mg total) by mouth 2 (two) times daily with a meal. 60 tablet 2  . ERGOCALCIFEROL PO Take 1 capsule by mouth daily. Tuesday     . fentaNYL (DURAGESIC - DOSED MCG/HR) 50 MCG/HR Place 1 patch (50 mcg total) every 3 (three) days onto the skin. 10 patch 0  . ferrous sulfate 325 (65 FE) MG tablet Take 325 mg by mouth daily with breakfast.     . oxybutynin (DITROPAN) 5 MG tablet Take 5 mg by mouth 4 (four) times daily.    Marland Kitchen oxyCODONE-acetaminophen (PERCOCET) 7.5-325 MG tablet Take 1 tablet by mouth every 8 (eight) hours as needed for severe pain. 90 tablet 0  . pantoprazole (PROTONIX) 40 MG tablet Take 1 tablet (40 mg total) by mouth daily before breakfast. 30 tablet 2  . predniSONE (DELTASONE) 20 MG tablet Take 1 tablet (20 mg total) by mouth daily with breakfast. 14 tablet 0  . ranitidine (ZANTAC) 150 MG tablet Take 150 mg by mouth 2 (two) times daily.     . verapamil (CALAN-SR) 240 MG CR tablet Take 1 tablet (240 mg total) by mouth daily. (Patient taking differently: Take 240 mg by mouth 2 (two) times daily. ) 30 tablet 2  . vitamin B-12 (CYANOCOBALAMIN) 1000 MCG tablet Take 1,000 mcg by mouth daily.    Marland Kitchen acetaminophen (TYLENOL) 500 MG tablet Take 1,000 mg by mouth at bedtime.    . enzalutamide (XTANDI) 40 MG capsule Take 4 capsules (160 mg total) daily by mouth. 120 capsule 0   Current Facility-Administered Medications  Medication Dose Route Frequency Provider Last Rate Last Dose  . morphine 2 MG/ML injection 2 mg  2 mg Intravenous Once Cammie Sickle, MD        PHYSICAL EXAMINATION: ECOG PERFORMANCE STATUS: 0 - Asymptomatic  BP (!) 143/81 (Patient Position: Sitting)   Pulse 67   Temp 97.8 F (36.6 C) (Tympanic)   Resp 20   Ht 5\' 11"  (1.803 m)   Wt 200 lb (90.7 kg)   BMI 27.89 kg/m   Filed Weights   07/04/17 1145  Weight: 200 lb (90.7 kg)    GENERAL: Well-nourished well-developed; Alert, no distress and comfortable.  Accompanied by his wife. Patient is in a wheelchair  because of left hip pain. EYES: no pallor or icterus OROPHARYNX: no thrush or ulceration; dentures. NECK: supple, no masses felt LYMPH: no palpable lymphadenopathy in the cervical, axillary or inguinal regions LUNGS: clear to auscultation and  No wheeze or crackles HEART/CVS: regular rate & rhythm and no murmurs; No lower extremity edema ABDOMEN: abdomen soft, non-tender and normal bowel sounds Musculoskeletal:no cyanosis of digits and no clubbing. Localizes pain posteriorly left  ilium/iliac crests. Pain is reproducible with palpation. Unable to bear full weight on the left hip. Ambulation compromised PSYCH: alert & oriented x 3 with fluent speech NEURO: no focal motor/sensory deficits SKIN:  no rashes or significant lesions  LABORATORY DATA:  I have reviewed the data as listed    Component Value Date/Time   NA 136 07/01/2017 1134   K 4.3 07/01/2017 1134   CL 100 (L) 07/01/2017 1134   CO2 26 07/01/2017 1134   GLUCOSE 166 (H) 07/01/2017 1134   BUN 33 (H) 07/01/2017 1134   CREATININE 1.27 (H) 07/01/2017 1134   CALCIUM 10.1 07/01/2017 1134   PROT 7.0 07/01/2017 1134   ALBUMIN 3.7 07/01/2017 1134   AST 21 07/01/2017 1134   ALT 15 (L) 07/01/2017 1134   ALKPHOS 67 07/01/2017 1134   BILITOT 0.4 07/01/2017 1134   GFRNONAA 54 (L) 07/01/2017 1134   GFRAA >60 07/01/2017 1134    No results found for: SPEP, UPEP  Lab Results  Component Value Date   WBC 12.9 (H) 07/01/2017   NEUTROABS 9.8 (H) 07/01/2017   HGB 13.3 07/01/2017   HCT 41.2 07/01/2017   MCV 87.6 07/01/2017   PLT 216 07/01/2017      Chemistry      Component Value Date/Time   NA 136 07/01/2017 1134   K 4.3 07/01/2017 1134   CL 100 (L) 07/01/2017 1134   CO2 26 07/01/2017 1134   BUN 33 (H) 07/01/2017 1134   CREATININE 1.27 (H) 07/01/2017 1134      Component Value Date/Time   CALCIUM 10.1 07/01/2017 1134   ALKPHOS 67 07/01/2017 1134   AST 21 07/01/2017 1134   ALT 15 (L) 07/01/2017 1134   BILITOT 0.4 07/01/2017  1134     Results for EIDAN, MUELLNER (MRN [id number]) as of 03/11/2017 13:41  Ref. Range 11/21/2015 13:31 03/07/2016 14:55 06/28/2016 13:40 10/19/2016 13:35 02/25/2017 11:10  PSA Latest Ref Range: 0.00 - 4.00 ng/mL 0.12 0.06 0.02 0.10   Prostatic Specific Antigen Latest Ref Range: 0.00 - 4.00 ng/mL     0.39    IMPRESSION: Areas of bony metastatic disease in the lumbar spine, probable lower thoracic spine, and right ischium. There appears to be an increase in abnormal uptake in the lower thoracic spinous process regions at T9 and T10 as well as in portions of the L3 and L5 vertebrae compared the prior study. Radiographic correlation advised to confirm progression of bony metastatic disease in these areas. The abnormal radiotracer uptake at L4 and in the right ischium are stable. Old rib fractures show stable uptake bilaterally. Multiple areas of arthropathy noted. Fullness of the left renal collecting system has been documented previously and accounts for the decreased uptake in the left kidney compared to the right, a stable finding.  Electronically Signed   By: Lowella Grip III M.D.   On: 03/08/2017 13:01   IMPRESSION: 1. No acute intrathoracic pathology. No CT evidence of pulmonary embolus. 2. Mild emphysema. 3. **An incidental finding of potential clinical significance has been found. Right thyroid hypodense nodule. Further evaluation with ultrasound on a nonemergent basis recommended.** 4. Aortic Atherosclerosis (ICD10-I70.0) and Emphysema (ICD10-J43.9).  Electronically Signed   By: Anner Crete M.D.   On: 06/11/2017 22:10   IMPRESSION: Stable sclerotic right inferior pubic ramus metastasis. No additional bony abnormality.  Electronically Signed   By: Rolm Baptise M.D.   On: 06/12/2017 19:31   Results for YULIAN, GOSNEY (MRN [id number]) as of 07/04/2017 11:55  Ref. Range 03/07/2016 14:55 06/28/2016 13:40 10/19/2016 13:35 02/25/2017 11:10 07/01/2017 11:34  PSA Latest Ref  Range: 0.00 - 4.00 ng/mL 0.06 0.02 0.10    Prostatic Specific Antigen Latest Ref Range: 0.00 - 4.00 ng/mL    0.39 0.92    IMPRESSION: 1. 16 mm T8 metastasis without pathologic fracture. Expansile RIGHT posterior sixth rib metastasis. 2. Mild degenerative change of the spine, less than expected for age. No canal stenosis or neural foraminal narrowing.   Electronically Signed   By: Elon Alas M.D.   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  MRI shows progressive lesion in his thoracic spine.  Patient unable to get his lumbar spine MRI-secondary to pain.  #Given the progression of the disease [PSA not a good marker]; I discussed multiple options including chemotherapy-docetaxel; also discussed regarding-Zytiga plus prednisone/Xtandi.  After discussing multiple pros and cons of each therapy-patient declined chemotherapy.  Patient understands treatments are palliative not curative.  # We will start the patient on Xtandi; discussed the potential increased risk of fatigue/risk of seizures.  Prescription initiated.    #Given the ongoing left hip pain-I would recommend Auximin PET scan for further evaluation.  This will help [place] target his pain with radiation  # Bone mets-Xgeva q. Monthly.  #Pain control-increase the dose of fentanyl to 50 mcg;and also continue current dose of Percocet 7.5 325 every 8 hours.  # Left ureteral stent/hydronephrosis- likely from pelvic adenopathy from his malignancy/ CKD- stage III- creat 1.3. Reviewed the labs.   # follow up in 4 week/labs/X-geva; PET 3 week PET [if pos RT referral].  # 40 minutes face-to-face with the patient discussing the above plan of care; more than 50% of time spent on prognosis/ natural history; counseling and coordination.    Orders Placed This Encounter  Procedures   . NM PET (AXUMIN) SKULL BASE TO MID THIGH    Standing Status:   Future    Standing Expiration Date:   09/03/2018    Order Specific Question:   If indicated for the ordered procedure, I authorize the administration of a radiopharmaceutical per Radiology protocol    Answer:   Yes    Order Specific Question:   Preferred imaging location?    Answer:   [facility]    Order Specific Question:   Radiology Contrast Protocol - do NOT remove file path    Answer:   [email or web address]     Cammie Sickle, MD 07/05/2017 10:14 PM

## 2017-07-05 ENCOUNTER — Telehealth: Payer: Self-pay | Admitting: Pharmacist

## 2017-07-05 DIAGNOSIS — Z7189 Other specified counseling: Secondary | ICD-10-CM | POA: Insufficient documentation

## 2017-07-05 NOTE — Telephone Encounter (Signed)
Oral Oncology Pharmacist Encounter  Received new prescription for Xtandi (enzalutamide) for the treatment of metastatic castrate resistant prostate cancer, planned duration until disease progression or unacceptable drug toxicity.  CMP/CBC from 07/01/17 assessed, no relevant lab abnormalities. Prescription dose and frequency assessed.   Current medication list in Epic reviewed, a few DDIs with Xtandi (enzalutamide) identified: -Decrease the concentrations of atorvastatin, oxycodone, pantoprazole, and verapamil are possible. Mr. Ketchum should be monitored for the need to change his dosing.    Due to medication copay patient advocate Tonye Pearson is applying for manufacturer assistance for this patient.  Oral Oncology Clinic will continue to follow for insurance authorization, copayment issues, initial counseling and start date.  Darl Pikes, PharmD, BCPS Hematology/Oncology Clinical Pharmacist [facility] Oral [place] [facility] [phone]  07/05/2017 12:18 PM

## 2017-07-08 NOTE — Telephone Encounter (Signed)
Oral Oncology Patient Advocate Encounter  Met patient in lobbyto complete application for Xtandi in an effort to reduce patient's out of pocket expense for Xtandi to $0.    Application completed and faxed to [phone].   Patient assistance phone number for follow up is [phone].   This encounter will be updated until final determination.  [place] Patient Advocate [phone] 07/08/2017 1:34 PM

## 2017-07-08 NOTE — Telephone Encounter (Signed)
Oral Oncology Patient Advocate Encounter  Prior Authorization for [name] has been approved.    PA# [id number] Effective dates: 07/07/2017 through 07/07/2018  Oral Oncology Clinic will continue to follow.   Patient co-pay is $3361.22  [name] Patient Advocate [phone] 07/08/2017 8:21 AM

## 2017-07-18 ENCOUNTER — Other Ambulatory Visit: Payer: Self-pay | Admitting: Radiology

## 2017-07-18 DIAGNOSIS — N1330 Unspecified hydronephrosis: Secondary | ICD-10-CM

## 2017-07-19 ENCOUNTER — Telehealth: Payer: Self-pay | Admitting: Internal Medicine

## 2017-07-19 NOTE — Telephone Encounter (Signed)
Oral Oncology Patient Advocate Encounter  Met patient in lobby to complete application for [facility] Wynetta Emery in an effort to reduce patient's out of pocket expense for Zytiga to $0.    Patient coming Tuesday 07/23/2017 to bring paper work.  Application completed and faxed to [facility].   Patient assistance phone number for follow up is [phone].   This encounter will be updated until final determination.   [place] Patient Advocate [phone] 07/19/2017 11:13 AM

## 2017-07-19 NOTE — Telephone Encounter (Signed)
Oral Oncology Patient Advocate Encounter  Received notification from [facility] that patient has been denied enrollment into their program to receive Xtandi from the drug manufacturer.      Called patient to let him know. Per patient he does not get $51000.00 per year. He is bringing paper work on Tuesday for me to appeal this.   Patient knows to call the office with questions or concerns.  Oral Oncology Clinic will continue to follow.  [place] Patient Advocate [phone] 07/19/2017 11:10 AM

## 2017-07-25 ENCOUNTER — Telehealth: Payer: Self-pay | Admitting: Internal Medicine

## 2017-07-25 ENCOUNTER — Telehealth: Payer: Self-pay | Admitting: Pharmacist

## 2017-07-25 DIAGNOSIS — C7951 Secondary malignant neoplasm of bone: Principal | ICD-10-CM

## 2017-07-25 DIAGNOSIS — C61 Malignant neoplasm of prostate: Secondary | ICD-10-CM

## 2017-07-25 MED ORDER — PREDNISONE 5 MG PO TABS: 5.0000 mg | ORAL_TABLET | Freq: Two times a day (BID) | ORAL | 3 refills | Status: DC

## 2017-07-25 MED ORDER — ABIRATERONE ACETATE 250 MG PO TABS: 1000.0000 mg | ORAL_TABLET | Freq: Every day | ORAL | 0 refills | Status: DC

## 2017-07-25 NOTE — Telephone Encounter (Signed)
Oral Oncology Pharmacist Encounter  Mr. [name] was denied assistance from the [place] manufacturer assistance. We applied for Zytiga manufacturer patient assistance and he was APPROVED.  Zytiga (abiraterone) will be used for the treatment of metastatic prostate cancer in conjunction with prednisone, planned duration until disease progression or unacceptable drug toxicity.  CMP from 07/01/17 assessed, no relevant lab abnormalities. BP from 07/04/17 assessed, BP slightly elevated, will continue to monitor. Prescription dose and frequency assessed.   Current medication list in Epic reviewed, one relevant DDIs with Zytiga identified: - Zytiga may increase the concentration of her carvedilol. Monitor Mr. [name] for bradycardia. He is currently on a low dose of carvedilol.   Patient education Counseled patient and is wife in person when them came in to sign manufacturer paper work. Reviewed administration, dosing, side effects, monitoring, drug-food interactions, safe handling, storage, and disposal.  Patient will take 4 tablets (1,000 mg total) by mouth daily. Take on an empty stomach 1 hour before or 2 hours after a meal.  Prednisone prescription was sent to his local pharmacy. Reviewed how to take this medication with him. He will take 1 tablet (5 mg total) by mouth 2 (two) times daily. Take with food.  Side effects include but not limited to: fatigue, N/V/D, hot flush, and headache.    Reviewed with patient importance of keeping a medication schedule and plan for any missed doses.  Provided patient with medication handout.  Mr. [name] and hiswife voiced understanding and appreciation. All questions answered.  Provided patient with [facility] [place] [facility] phone number. Patient knows to call the office with questions or concerns. Oral Chemotherapy Navigation Clinic will continue to follow.  Darl Pikes, PharmD, BCPS Hematology/Oncology Clinical Pharmacist [facility] Oral  [facility] [phone]  07/25/2017 1:22 PM

## 2017-07-25 NOTE — Telephone Encounter (Signed)
Oral Oncology Patient Advocate Encounter  Patient coming to sign Medicare Part D Attestation. Will fax to [facility] [phone].   [place] Patient Advocate [phone] 07/25/2017 10:33 AM

## 2017-07-25 NOTE — Telephone Encounter (Signed)
Oral Oncology Patient Advocate Encounter  Received notification from [facility] Patient Assistance program that patient has been successfully enrolled into their program to receive Zytiga  from the manufacturer at $0 out of pocket until 08/19/2017.   I called and spoke with patient.  He knows we will have to re-apply.   Patient knows to call the office with questions or concerns.  Oral Oncology Clinic will continue to follow.  [place] Patient Advocate [phone] 07/25/2017 10:32 AM

## 2017-07-30 ENCOUNTER — Telehealth: Payer: Self-pay | Admitting: Internal Medicine

## 2017-07-30 NOTE — Telephone Encounter (Signed)
Oral Oncology Patient Advocate Encounter  Called [facility] they will ship out Zytiga 08/05/2017 per patient want to ship that date.    [place] Patient Advocate [phone] 07/30/2017 9:15 AM

## 2017-08-01 ENCOUNTER — Other Ambulatory Visit: Payer: Self-pay | Admitting: Internal Medicine

## 2017-08-01 DIAGNOSIS — C7951 Secondary malignant neoplasm of bone: Principal | ICD-10-CM

## 2017-08-01 DIAGNOSIS — C61 Malignant neoplasm of prostate: Secondary | ICD-10-CM

## 2017-08-01 NOTE — Telephone Encounter (Signed)
Oral Oncology Patient Advocate Encounter  Re-enrollment 2019  Met patient in lobby to complete re-enrollment application for [facility] Wynetta Emery in an effort to reduce patient's out of pocket expense for Zytiga to $0.    Application completed and faxed to [phone].   Patient assistance phone number for follow up is [phone].   This encounter will be updated until final determination.    [place] Patient Advocate [phone] 08/01/2017 11:37 AM

## 2017-08-02 ENCOUNTER — Inpatient Hospital Stay (HOSPITAL_BASED_OUTPATIENT_CLINIC_OR_DEPARTMENT_OTHER): Payer: Medicare PPO | Admitting: Internal Medicine

## 2017-08-02 ENCOUNTER — Encounter: Payer: Self-pay | Admitting: Internal Medicine

## 2017-08-02 ENCOUNTER — Other Ambulatory Visit: Payer: Self-pay

## 2017-08-02 ENCOUNTER — Inpatient Hospital Stay: Payer: Medicare PPO | Attending: Internal Medicine

## 2017-08-02 ENCOUNTER — Inpatient Hospital Stay: Payer: Medicare PPO

## 2017-08-02 VITALS — BP 110/78 | HR 68 | Temp 97.5°F | Resp 20

## 2017-08-02 DIAGNOSIS — Z87891 Personal history of nicotine dependence: Secondary | ICD-10-CM

## 2017-08-02 DIAGNOSIS — K219 Gastro-esophageal reflux disease without esophagitis: Secondary | ICD-10-CM

## 2017-08-02 DIAGNOSIS — C7951 Secondary malignant neoplasm of bone: Secondary | ICD-10-CM | POA: Insufficient documentation

## 2017-08-02 DIAGNOSIS — C61 Malignant neoplasm of prostate: Secondary | ICD-10-CM | POA: Diagnosis not present

## 2017-08-02 DIAGNOSIS — I700 Atherosclerosis of aorta: Secondary | ICD-10-CM

## 2017-08-02 DIAGNOSIS — I4892 Unspecified atrial flutter: Secondary | ICD-10-CM | POA: Insufficient documentation

## 2017-08-02 DIAGNOSIS — R634 Abnormal weight loss: Secondary | ICD-10-CM | POA: Diagnosis not present

## 2017-08-02 DIAGNOSIS — E7800 Pure hypercholesterolemia, unspecified: Secondary | ICD-10-CM | POA: Insufficient documentation

## 2017-08-02 DIAGNOSIS — Z79899 Other long term (current) drug therapy: Secondary | ICD-10-CM | POA: Insufficient documentation

## 2017-08-02 DIAGNOSIS — J439 Emphysema, unspecified: Secondary | ICD-10-CM

## 2017-08-02 DIAGNOSIS — Z803 Family history of malignant neoplasm of breast: Secondary | ICD-10-CM | POA: Insufficient documentation

## 2017-08-02 DIAGNOSIS — R011 Cardiac murmur, unspecified: Secondary | ICD-10-CM | POA: Insufficient documentation

## 2017-08-02 DIAGNOSIS — I350 Nonrheumatic aortic (valve) stenosis: Secondary | ICD-10-CM

## 2017-08-02 DIAGNOSIS — Z7982 Long term (current) use of aspirin: Secondary | ICD-10-CM | POA: Insufficient documentation

## 2017-08-02 DIAGNOSIS — I10 Essential (primary) hypertension: Secondary | ICD-10-CM | POA: Diagnosis not present

## 2017-08-02 DIAGNOSIS — E119 Type 2 diabetes mellitus without complications: Secondary | ICD-10-CM

## 2017-08-02 DIAGNOSIS — N1330 Unspecified hydronephrosis: Secondary | ICD-10-CM | POA: Insufficient documentation

## 2017-08-02 DIAGNOSIS — R630 Anorexia: Secondary | ICD-10-CM

## 2017-08-02 DIAGNOSIS — Z9181 History of falling: Secondary | ICD-10-CM

## 2017-08-02 DIAGNOSIS — G893 Neoplasm related pain (acute) (chronic): Secondary | ICD-10-CM | POA: Insufficient documentation

## 2017-08-02 LAB — COMPREHENSIVE METABOLIC PANEL
ALK PHOS: 70 U/L (ref 38–126)
ALT: 11 U/L — AB (ref 17–63)
AST: 20 U/L (ref 15–41)
Albumin: 4 g/dL (ref 3.5–5.0)
Anion gap: 9 (ref 5–15)
BUN: 34 mg/dL — AB (ref 6–20)
CALCIUM: 10.5 mg/dL — AB (ref 8.9–10.3)
CHLORIDE: 103 mmol/L (ref 101–111)
CO2: 26 mmol/L (ref 22–32)
CREATININE: 1.3 mg/dL — AB (ref 0.61–1.24)
GFR calc non Af Amer: 52 mL/min — ABNORMAL LOW (ref >60)
GFR, EST AFRICAN AMERICAN: 60 mL/min — AB (ref >60)
GLUCOSE: 167 mg/dL — AB (ref 65–99)
Potassium: 4.1 mmol/L (ref 3.5–5.1)
SODIUM: 138 mmol/L (ref 135–145)
Total Bilirubin: 0.4 mg/dL (ref 0.3–1.2)
Total Protein: 7.3 g/dL (ref 6.5–8.1)

## 2017-08-02 LAB — CBC WITH DIFFERENTIAL/PLATELET
BASOS PCT: 0 %
Basophils Absolute: 0 10*3/uL (ref 0–0.1)
EOS ABS: 0.4 10*3/uL (ref 0–0.7)
EOS PCT: 3 %
HCT: 43.1 % (ref 40.0–52.0)
HEMOGLOBIN: 14.1 g/dL (ref 13.0–18.0)
LYMPHS ABS: 2.1 10*3/uL (ref 1.0–3.6)
Lymphocytes Relative: 20 %
MCH: 28.6 pg (ref 26.0–34.0)
MCHC: 32.8 g/dL (ref 32.0–36.0)
MCV: 87.2 fL (ref 80.0–100.0)
MONO ABS: 0.9 10*3/uL (ref 0.2–1.0)
MONOS PCT: 9 %
NEUTROS PCT: 68 %
Neutro Abs: 7.1 10*3/uL — ABNORMAL HIGH (ref 1.4–6.5)
PLATELETS: 192 10*3/uL (ref 150–440)
RBC: 4.95 MIL/uL (ref 4.40–5.90)
RDW: 16.8 % — AB (ref 11.5–14.5)
WBC: 10.5 10*3/uL (ref 3.8–10.6)

## 2017-08-02 MED ORDER — OXYCODONE-ACETAMINOPHEN 7.5-325 MG PO TABS: 1.0000 | ORAL_TABLET | Freq: Three times a day (TID) | ORAL | 0 refills | Status: DC | PRN

## 2017-08-02 MED ORDER — FENTANYL 50 MCG/HR TD PT72: 50.0000 ug | MEDICATED_PATCH | TRANSDERMAL | 0 refills | Status: DC

## 2017-08-02 MED ORDER — SODIUM CHLORIDE 0.9 % IV SOLN
INTRAVENOUS | Status: DC
  Filled 2017-08-02: qty 1000

## 2017-08-02 MED ORDER — DENOSUMAB 120 MG/1.7ML ~~LOC~~ SOLN
120.0000 mg | Freq: Once | SUBCUTANEOUS | Status: AC
  Administered 2017-08-02: 120 mg via SUBCUTANEOUS
  Filled 2017-08-02: qty 1.7

## 2017-08-02 NOTE — Progress Notes (Signed)
[facility] OFFICE PROGRESS NOTE  Patient Care Team: Tamsen Roers, MD as PCP - General (Family Medicine)   SUMMARY OF ONCOLOGIC HISTORY:  Oncology History   # AUG 2016- METASTATIC PROSTATE CA/ STAGE IV; Castrate sensitive [PSA- 48] ; mets- lumbar spine [s/p pal RT to Aug 2016; Dr.Crystal] /Pelvic LN; 1994- Prostate CA s/p Surgery [facility]]; Lupron q4 M; MARCH 2017- Bone scan- L4/Right pubic rami uptake; PSA- 0.1/testosterone-castrate [<3]  # Bone lesions on denosumab;DEC 2016- Recm q 44M  # NOV 2018- CASTRATE RESISTANT; declined chemo; 08/06/2017- start Zytiga+prednisone  # Left hydronephrosis s/p stenting [Dr.Budzyn]     Prostate cancer metastatic to bone [facility])    INTERVAL HISTORY:  A very pleasant 76 year old male patient with above history of prostate cancer stage IV hormone sensitive currently on Lupron every 4 months-is here for follow-up.  Patient continues to have significant pain in his left hip.  He has not had his PET scan is ordered because of scheduling issues.   Is currently on fentanyl patch 50 mcg; and also hydrocodone 5/325 every 6 hours [through PCP]; pain not well controlled.  Overall he continues to feel poorly.  Poor appetite.  No nausea no vomiting.  Positive for weight loss.  He also fell recently/slipped on ice.  Denies any weakness in one particular arm or leg.  REVIEW OF SYSTEMS:  A complete 10 point review of system is done which is negative except mentioned above/history of present illness.   PAST MEDICAL HISTORY :  Past Medical History:  Diagnosis Date  . Aortic stenosis, moderate 01/30/2014  . Arthritis   . Atrial flutter ([place]) 02/02/2014  . Diabetes mellitus without complication (HCC)    diet control  . GERD (gastroesophageal reflux disease)   . Heart murmur   . Heart murmur   . HTN (hypertension) 01/30/2014  . Hypercholesteremia   . Hypertension   . Prostate cancer ([place])    metastatic  . Weakness of left leg     PAST  SURGICAL HISTORY :   Past Surgical History:  Procedure Laterality Date  . CATARACT EXTRACTION W/ INTRAOCULAR LENS IMPLANT Bilateral   . COLON RESECTION    . CYSTOSCOPY W/ RETROGRADES Left 06/08/2015   Procedure: CYSTOSCOPY WITH RETROGRADE PYELOGRAM;  Surgeon: Nickie Retort, MD;  Location: [facility] OR[facility];  Service: Urology;  Laterality: Left;  . CYSTOSCOPY W/ URETERAL STENT PLACEMENT Left 10/05/2015   Procedure: CYSTOSCOPY WITH STENT REPLACEMENT;  Surgeon: Nickie Retort, MD;  Location: [facility] OR[facility];  Service: Urology;  Laterality: Left;  . CYSTOSCOPY W/ URETERAL STENT PLACEMENT Left 01/04/2016   Procedure: CYSTOSCOPY WITH STENT REPLACEMENT;  Surgeon: Nickie Retort, MD;  Location: [facility];  Service: Urology;  Laterality: Left;  . CYSTOSCOPY W/ URETERAL STENT PLACEMENT Left 04/11/2016   Procedure: CYSTOSCOPY WITH STENT REPLACEMENT;  Surgeon: Nickie Retort, MD;  Location: [facility];  Service: Urology;  Laterality: Left;  . CYSTOSCOPY W/ URETERAL STENT PLACEMENT Left 07/18/2016   Procedure: CYSTOSCOPY WITH STENT REPLACEMENT;  Surgeon: Nickie Retort, MD;  Location: [facility];  Service: Urology;  Laterality: Left;  . CYSTOSCOPY W/ URETERAL STENT PLACEMENT Left 11/02/2016   Procedure: CYSTOSCOPY WITH STENT REPLACEMENT;  Surgeon: Nickie Retort, MD;  Location: [facility];  Service: Urology;  Laterality: Left;  . CYSTOSCOPY W/ URETERAL STENT PLACEMENT Left 02/08/2017   Procedure: CYSTOSCOPY WITH STENT REPLACEMENT;  Surgeon: Nickie Retort, MD;  Location: [facility];  Service: Urology;  Laterality: Left;  . CYSTOSCOPY W/ URETERAL  STENT PLACEMENT Left 05/10/2017   Procedure: CYSTOSCOPY WITH STENT REPLACEMENT;  Surgeon: Nickie Retort, MD;  Location: [facility];  Service: Urology;  Laterality: Left;  . EYE SURGERY Bilateral Sept and Oct.2012   cataract  . HERNIA REPAIR    . PROSTATECTOMY  1994    FAMILY HISTORY :   Family History  Problem Relation Age of Onset  . Aneurysm Father   .  Cancer Mother        breast    SOCIAL HISTORY:   Social History   Tobacco Use  . Smoking status: Former Smoker    Packs/day: 1.50    Years: 25.00    Pack years: 37.50    Types: Cigarettes    Last attempt to quit: 01/21/1974    Years since quitting: 43.5  . Smokeless tobacco: Never Used  Substance Use Topics  . Alcohol use: No  . Drug use: No    ALLERGIES:  has No Known Allergies.  MEDICATIONS:  Current Outpatient Medications  Medication Sig Dispense Refill  . acetaminophen (TYLENOL) 500 MG tablet Take 1,000 mg by mouth at bedtime.    Marland Kitchen allopurinol (ZYLOPRIM) 300 MG tablet Take 300 mg by mouth every morning.     . Ascorbic Acid (VITAMIN C) 100 MG tablet Take 500 mg by mouth daily.    Marland Kitchen aspirin 81 MG tablet Take 81 mg by mouth every morning. Reported on 01/03/2016    . atorvastatin (LIPITOR) 10 MG tablet Take 10 mg by mouth daily.    . carvedilol (COREG) 6.25 MG tablet Take 1 tablet (6.25 mg total) by mouth 2 (two) times daily with a meal. 60 tablet 2  . chlorthalidone (HYGROTON) 25 MG tablet Take 25 mg by mouth daily.     . ERGOCALCIFEROL PO Take 1 capsule by mouth daily. Tuesday     . fentaNYL (DURAGESIC - DOSED MCG/HR) 50 MCG/HR Place 1 patch (50 mcg total) onto the skin every 3 (three) days. 10 patch 0  . ferrous sulfate 325 (65 FE) MG tablet Take 325 mg by mouth daily with breakfast.     . HYDROcodone-acetaminophen (NORCO/VICODIN) 5-325 MG tablet Take 1 tablet by mouth every 8 (eight) hours as needed for pain.    Marland Kitchen oxybutynin (DITROPAN) 5 MG tablet Take 5 mg by mouth 4 (four) times daily.    . pantoprazole (PROTONIX) 40 MG tablet Take 1 tablet (40 mg total) by mouth daily before breakfast. 30 tablet 2  . ranitidine (ZANTAC) 150 MG tablet Take 150 mg by mouth 2 (two) times daily.     . verapamil (CALAN-SR) 240 MG CR tablet Take 1 tablet (240 mg total) by mouth daily. (Patient taking differently: Take 240 mg by mouth 2 (two) times daily. ) 30 tablet 2  . vitamin B-12  (CYANOCOBALAMIN) 1000 MCG tablet Take 1,000 mcg by mouth daily.    Marland Kitchen abiraterone acetate (ZYTIGA) 250 MG tablet Take 4 tablets (1,000 mg total) by mouth daily. Take on an empty stomach 1 hour before or 2 hours after a meal (Patient not taking: Reported on 08/02/2017) 120 tablet 0  . oxyCODONE-acetaminophen (PERCOCET) 7.5-325 MG tablet Take 1 tablet by mouth every 8 (eight) hours as needed for severe pain. 90 tablet 0  . predniSONE (DELTASONE) 5 MG tablet Take 1 tablet (5 mg total) by mouth 2 (two) times daily. Take with food (Patient not taking: Reported on 08/02/2017) 60 tablet 3   Current Facility-Administered Medications  Medication Dose Route Frequency Provider Last Rate Last  Dose  . morphine 2 MG/ML injection 2 mg  2 mg Intravenous Once Cammie Sickle, MD       Facility-Administered Medications Ordered in Other Visits  Medication Dose Route Frequency Provider Last Rate Last Dose  . 0.9 %  sodium chloride infusion   Intravenous Continuous Cammie Sickle, MD        PHYSICAL EXAMINATION: ECOG PERFORMANCE STATUS: 0 - Asymptomatic  BP 110/78   Pulse 68   Temp (!) 97.5 F (36.4 C) (Tympanic)   Resp 20   There were no vitals filed for this visit.  GENERAL: Well-nourished well-developed; Alert, no distress and comfortable.  Accompanied by his wife. Patient is in a wheelchair because of left hip pain. EYES: no pallor or icterus OROPHARYNX: no thrush or ulceration; dentures. NECK: supple, no masses felt LYMPH: no palpable lymphadenopathy in the cervical, axillary or inguinal regions LUNGS: clear to auscultation and  No wheeze or crackles HEART/CVS: regular rate & rhythm and no murmurs; No lower extremity edema ABDOMEN: abdomen soft, non-tender and normal bowel sounds Musculoskeletal:no cyanosis of digits and no clubbing. PSYCH: alert & oriented x 3 with fluent speech NEURO: no focal motor/sensory deficits SKIN:  no rashes or significant lesions  LABORATORY DATA:  I  have reviewed the data as listed    Component Value Date/Time   NA 138 08/02/2017 0911   K 4.1 08/02/2017 0911   CL 103 08/02/2017 0911   CO2 26 08/02/2017 0911   GLUCOSE 167 (H) 08/02/2017 0911   BUN 34 (H) 08/02/2017 0911   CREATININE 1.30 (H) 08/02/2017 0911   CALCIUM 10.5 (H) 08/02/2017 0911   PROT 7.3 08/02/2017 0911   ALBUMIN 4.0 08/02/2017 0911   AST 20 08/02/2017 0911   ALT 11 (L) 08/02/2017 0911   ALKPHOS 70 08/02/2017 0911   BILITOT 0.4 08/02/2017 0911   GFRNONAA 52 (L) 08/02/2017 0911   GFRAA 60 (L) 08/02/2017 0911    No results found for: SPEP, UPEP  Lab Results  Component Value Date   WBC 10.5 08/02/2017   NEUTROABS 7.1 (H) 08/02/2017   HGB 14.1 08/02/2017   HCT 43.1 08/02/2017   MCV 87.2 08/02/2017   PLT 192 08/02/2017      Chemistry      Component Value Date/Time   NA 138 08/02/2017 0911   K 4.1 08/02/2017 0911   CL 103 08/02/2017 0911   CO2 26 08/02/2017 0911   BUN 34 (H) 08/02/2017 0911   CREATININE 1.30 (H) 08/02/2017 0911      Component Value Date/Time   CALCIUM 10.5 (H) 08/02/2017 0911   ALKPHOS 70 08/02/2017 0911   AST 20 08/02/2017 0911   ALT 11 (L) 08/02/2017 0911   BILITOT 0.4 08/02/2017 0911     Results for CHRISTIEN, FRANKL (MRN [id number]) as of 03/11/2017 13:41  Ref. Range 11/21/2015 13:31 03/07/2016 14:55 06/28/2016 13:40 10/19/2016 13:35 02/25/2017 11:10  PSA Latest Ref Range: 0.00 - 4.00 ng/mL 0.12 0.06 0.02 0.10   Prostatic Specific Antigen Latest Ref Range: 0.00 - 4.00 ng/mL     0.39    IMPRESSION: Areas of bony metastatic disease in the lumbar spine, probable lower thoracic spine, and right ischium. There appears to be an increase in abnormal uptake in the lower thoracic spinous process regions at T9 and T10 as well as in portions of the L3 and L5 vertebrae compared the prior study. Radiographic correlation advised to confirm progression of bony metastatic disease in these areas. The abnormal radiotracer uptake  at L4 and in the right  ischium are stable. Old rib fractures show stable uptake bilaterally. Multiple areas of arthropathy noted. Fullness of the left renal collecting system has been documented previously and accounts for the decreased uptake in the left kidney compared to the right, a stable finding.  Electronically Signed   By: Lowella Grip III M.D.   On: 03/08/2017 13:01   IMPRESSION: 1. No acute intrathoracic pathology. No CT evidence of pulmonary embolus. 2. Mild emphysema. 3. **An incidental finding of potential clinical significance has been found. Right thyroid hypodense nodule. Further evaluation with ultrasound on a nonemergent basis recommended.** 4. Aortic Atherosclerosis (ICD10-I70.0) and Emphysema (ICD10-J43.9).  Electronically Signed   By: Anner Crete M.D.   On: 06/11/2017 22:10   IMPRESSION: Stable sclerotic right inferior pubic ramus metastasis. No additional bony abnormality.  Electronically Signed   By: Rolm Baptise M.D.   On: 06/12/2017 19:31   Results for GUNNAR, HEREFORD (MRN [id number]) as of 07/04/2017 11:55  Ref. Range 03/07/2016 14:55 06/28/2016 13:40 10/19/2016 13:35 02/25/2017 11:10 07/01/2017 11:34  PSA Latest Ref Range: 0.00 - 4.00 ng/mL 0.06 0.02 0.10    Prostatic Specific Antigen Latest Ref Range: 0.00 - 4.00 ng/mL    0.39 0.92    IMPRESSION: 1. 16 mm T8 metastasis without pathologic fracture. Expansile RIGHT posterior sixth rib metastasis. 2. Mild degenerative change of the spine, less than expected for age. No canal stenosis or neural foraminal narrowing.   Electronically Signed   By: Elon Alas M.D.   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 NOv 11th 2018-T spine MRI shows progressive lesion in his thoracic spine.  Patient unable to get his lumbar spine MRI-secondary to pain.  [PSA  not a good marker]; awaiting on Aux PET scan.   # Awating to start on Zytiga + prednisone 12/18th; again discussed the administration; potential side effects of the drugs in detail.  #Given the ongoing left hip pain-I would recommend Auximin PET scan for further evaluation.  This will help [place] target his pain with radiation; if no obvious visceral metastatic disease-patient could also be evaluated for Xofigo.   # Bone mets-Xgeva q. Monthly.  #Pain control-increase the dose of fentanyl to 50 mcg;and also continue current dose of Percocet 7.5 325 every 8 hours. New scripts given.   # Left ureteral stent/hydronephrosis- likely from pelvic adenopathy from his malignancy/ CKD- stage III- creat 1.3. Reviewed the labs.   # follow up in 4 week/labs/X-geva; PET scan ASAP [if pos RT referral].will call with results of PET.    Orders Placed This Encounter  Procedures  . CBC with Differential    Standing Status:   Future    Standing Expiration Date:   08/02/2018  . Comprehensive metabolic panel    Standing Status:   Future    Standing Expiration Date:   08/02/2018  . PSA    Standing Status:   Future    Standing Expiration Date:   08/02/2018     Cammie Sickle, MD 08/02/2017 12:07 PM

## 2017-08-02 NOTE — Progress Notes (Signed)
Pt never notified regarding the date of the pet scan. He states, "when I left the clinic at my last appointment I was given a piece of paper to call the scheduling office if I didn't hear when my apt would be scheduled. I personally didn't hear anything and left 2 voice msg for centralized scheduling to call me back. This was two weeks ago-I have the phone numbers as [phone] and [phone]. I had to leave a voice mail. Again, this was two weeks and no one has returned phone call back." Wife stated, "my husband called two different times and we never notified about the appointment." patient came to clinic today for his apt and MD discovered that the test was never performed. patient informed and "requested immediate follow-up on my pet scan date. I do not want to leave today without this being scheduled. This will impact my care and for what I am understanding is that Dr. B needs this test to determine where to give me radiation." I apologized to the patient for this experience and informed patient that we would schedule this appointment asap.   Pt also reports that he was out of pain medication. PCP wrote him a script for Norco 5/325 1 tablet every 8 hrs for pain. Pt was previously on percocet 7/325. Pt states that the pain medication that pcp prescribed was not effective to control his pain and he would like a new script written for the percocet 7/325 dosing.

## 2017-08-02 NOTE — Assessment & Plan Note (Addendum)
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 NOv 11th 2018-T spine MRI shows progressive lesion in his thoracic spine.  Patient unable to get his lumbar spine MRI-secondary to pain.  [PSA not a good marker]; awaiting on Aux PET scan.   # Awating to start on Zytiga + prednisone 12/18th; again discussed the administration; potential side effects of the drugs in detail.  #Given the ongoing left hip pain-I would recommend Auximin PET scan for further evaluation.  This will help [place] target his pain with radiation; if no obvious visceral metastatic disease-patient could also be evaluated for Xofigo.   # Bone mets-Xgeva q. Monthly.  #Pain control-increase the dose of fentanyl to 50 mcg;and also continue current dose of Percocet 7.5 325 every 8 hours. New scripts given.   # Left ureteral stent/hydronephrosis- likely from pelvic adenopathy from his malignancy/ CKD- stage III- creat 1.3. Reviewed the labs.   # follow up in 4 week/labs/X-geva; PET scan ASAP [if pos RT referral].will call with results of PET.

## 2017-08-06 ENCOUNTER — Telehealth: Payer: Self-pay | Admitting: Internal Medicine

## 2017-08-06 ENCOUNTER — Telehealth: Payer: Self-pay | Admitting: Pharmacist

## 2017-08-06 NOTE — Telephone Encounter (Addendum)
Oral Chemotherapy Pharmacist Encounter  Received a call from [name]. He received his Zytiga in the mail today. Discussed with him getting started on his Zytiga and prednisone. Reviewed administration of both medication and he stated his understanding.   I instructed him to give [place] a call if he gets confused about how to take his medication.  Darl Pikes, PharmD, BCPS Hematology/Oncology Clinical Pharmacist [facility] [phone]  08/06/2017 3:09 PM

## 2017-08-06 NOTE — Telephone Encounter (Signed)
Oral Oncology Patient Advocate Encounter  Patient called to let me know he got his Zytiga from [employer]. Per Clearnce Sorrel he can start his Zytiga with prednisone today.   [place] Patient Advocate [phone] 08/06/2017 3:03 PM

## 2017-08-08 ENCOUNTER — Encounter
Admission: RE | Admit: 2017-08-08 | Discharge: 2017-08-08 | Disposition: A | Discharge status: Home / Self Care | Payer: Medicare PPO | Source: Ambulatory Visit | Attending: Internal Medicine | Admitting: Internal Medicine

## 2017-08-08 DIAGNOSIS — C61 Malignant neoplasm of prostate: Secondary | ICD-10-CM | POA: Insufficient documentation

## 2017-08-08 DIAGNOSIS — C7951 Secondary malignant neoplasm of bone: Secondary | ICD-10-CM | POA: Insufficient documentation

## 2017-08-08 MED ORDER — AXUMIN (FLUCICLOVINE F 18) INJECTION
10.2000 | Freq: Once | INTRAVENOUS | Status: AC
  Administered 2017-08-08: 10.2 via INTRAVENOUS

## 2017-08-09 ENCOUNTER — Other Ambulatory Visit: Payer: Self-pay

## 2017-08-09 ENCOUNTER — Encounter
Admission: RE | Admit: 2017-08-09 | Discharge: 2017-08-09 | Disposition: A | Discharge status: Home / Self Care | Payer: Medicare PPO | Source: Ambulatory Visit | Attending: Urology | Admitting: Urology

## 2017-08-09 DIAGNOSIS — E7800 Pure hypercholesterolemia, unspecified: Secondary | ICD-10-CM | POA: Diagnosis not present

## 2017-08-09 DIAGNOSIS — Z960 Presence of urogenital implants: Secondary | ICD-10-CM | POA: Diagnosis not present

## 2017-08-09 DIAGNOSIS — M1990 Unspecified osteoarthritis, unspecified site: Secondary | ICD-10-CM | POA: Insufficient documentation

## 2017-08-09 DIAGNOSIS — Z79899 Other long term (current) drug therapy: Secondary | ICD-10-CM | POA: Insufficient documentation

## 2017-08-09 DIAGNOSIS — C7951 Secondary malignant neoplasm of bone: Secondary | ICD-10-CM | POA: Insufficient documentation

## 2017-08-09 DIAGNOSIS — Z79891 Long term (current) use of opiate analgesic: Secondary | ICD-10-CM | POA: Diagnosis not present

## 2017-08-09 DIAGNOSIS — R011 Cardiac murmur, unspecified: Secondary | ICD-10-CM | POA: Insufficient documentation

## 2017-08-09 DIAGNOSIS — Z7952 Long term (current) use of systemic steroids: Secondary | ICD-10-CM | POA: Diagnosis not present

## 2017-08-09 DIAGNOSIS — E119 Type 2 diabetes mellitus without complications: Secondary | ICD-10-CM | POA: Insufficient documentation

## 2017-08-09 DIAGNOSIS — Z87891 Personal history of nicotine dependence: Secondary | ICD-10-CM | POA: Insufficient documentation

## 2017-08-09 DIAGNOSIS — N1330 Unspecified hydronephrosis: Secondary | ICD-10-CM | POA: Insufficient documentation

## 2017-08-09 DIAGNOSIS — I10 Essential (primary) hypertension: Secondary | ICD-10-CM | POA: Insufficient documentation

## 2017-08-09 DIAGNOSIS — Z9889 Other specified postprocedural states: Secondary | ICD-10-CM | POA: Insufficient documentation

## 2017-08-09 DIAGNOSIS — Z803 Family history of malignant neoplasm of breast: Secondary | ICD-10-CM | POA: Insufficient documentation

## 2017-08-09 DIAGNOSIS — I350 Nonrheumatic aortic (valve) stenosis: Secondary | ICD-10-CM | POA: Diagnosis not present

## 2017-08-09 DIAGNOSIS — C61 Malignant neoplasm of prostate: Secondary | ICD-10-CM | POA: Insufficient documentation

## 2017-08-09 DIAGNOSIS — M25552 Pain in left hip: Secondary | ICD-10-CM | POA: Diagnosis not present

## 2017-08-09 DIAGNOSIS — K219 Gastro-esophageal reflux disease without esophagitis: Secondary | ICD-10-CM | POA: Insufficient documentation

## 2017-08-09 DIAGNOSIS — Z01812 Encounter for preprocedural laboratory examination: Secondary | ICD-10-CM | POA: Diagnosis present

## 2017-08-09 DIAGNOSIS — Z820 Family history of epilepsy and other diseases of the nervous system: Secondary | ICD-10-CM | POA: Diagnosis not present

## 2017-08-09 LAB — URINALYSIS, ROUTINE W REFLEX MICROSCOPIC
BILIRUBIN URINE: NEGATIVE
Glucose, UA: NEGATIVE mg/dL
Hgb urine dipstick: NEGATIVE
Ketones, ur: NEGATIVE mg/dL
LEUKOCYTES UA: NEGATIVE
NITRITE: NEGATIVE
PH: 5 (ref 5.0–8.0)
Protein, ur: NEGATIVE mg/dL
SPECIFIC GRAVITY, URINE: 1.019 (ref 1.005–1.030)

## 2017-08-09 NOTE — Patient Instructions (Signed)
Your procedure is scheduled on: August 16, 2017 FRIDAY Report to Same Day Surgery on the 2nd floor in the [facility]. To find out your arrival time, please call [phone] between 1PM - 3PM on: August 16, 2107 THURSDAY  REMEMBER: Instructions that are not followed completely may result in serious medical risk, up to and including death; or upon the discretion of your surgeon and anesthesiologist your surgery may need to be rescheduled.  Do not eat food after midnight the night before your procedure.  No gum chewing or hard candies.  You may however, drink CLEAR liquids up to 2 hours before you are scheduled to arrive at the hospital for your procedure.  Do not drink clear liquids within 2 hours of the start of your surgery.  Clear liquids include: - water  - apple juice without pulp - clear gatorade - black coffee or tea (Do NOT add anything to the coffee or tea) Do NOT drink anything that is not on this list.  Type 1 and Type 2 diabetics should only drink water.  No Alcohol for 24 hours before or after surgery.  No Smoking including e-cigarettes for 24 hours prior to surgery. No chewable tobacco products for at least 6 hours prior to surgery. No nicotine patches on the day of surgery.  Notify your doctor if there is any change in your medical condition (cold, fever, infection).  Do not wear jewelry, make-up, hairpins, clips or nail polish.  Do not wear lotions, powders, or perfumes. You may wear deodorant.  Do not shave 48 hours prior to surgery. Men may shave face and neck.  Contacts and dentures may not be worn into surgery.  Do not bring valuables to the hospital. [facility] is not responsible for any belongings or valuables.   TAKE THESE MEDICATIONS THE MORNING OF SURGERY WITH A SIP OF WATER: ZYTIGA ALLOPURINOL CARVEDILOL HYDROCODONE-ACETAMINOPHEN OXYBUTIN PANTOPRAZOLE - DOSE NIGHT BEFORE AND MORNING OF SURGERY RANITIDINE PREDNISONE    Follow  recommendations from Cardiologist, Pulmonologist or PCP regarding stopping Aspirin, Coumadin, Plavix, Eliquis, Pradaxa, or Pletal.  Stop Anti-inflammatories such as Advil, Aleve, Ibuprofen, Motrin, Naproxen, Naprosyn, Goodie powder, or aspirin products. (May take Tylenol or Acetaminophen if needed.)  Stop ANY OVER THE COUNTER supplements until after surgery VITAMIN C . (May continue Vitamin D, Vitamin B, and multivitamin.)  If you are being admitted to the hospital overnight, leave your suitcase in the car. After surgery it may be brought to your room.  If you are being discharged the day of surgery, you will not be allowed to drive home. You will need someone to drive you home and stay with you that night.   If you are taking public transportation, you will need to have a responsible adult to with you.  Please call the number above if you have any questions about these instructions.

## 2017-08-10 LAB — URINE CULTURE: Culture: NO GROWTH

## 2017-08-14 NOTE — Pre-Procedure Instructions (Signed)
Message left of Amy's voicemail regarding the need for H+P for pt's surgery on 08/16/2017.

## 2017-08-14 NOTE — Pre-Procedure Instructions (Signed)
Received message from Dr. Dene Gentry office, he will do H+P the am of surgery.

## 2017-08-15 MED ORDER — CEFAZOLIN SODIUM-DEXTROSE 2-4 GM/100ML-% IV SOLN
2.0000 g | INTRAVENOUS | Status: AC
  Administered 2017-08-16: 2 g via INTRAVENOUS

## 2017-08-16 ENCOUNTER — Encounter: Payer: Self-pay | Admitting: Anesthesiology

## 2017-08-16 ENCOUNTER — Encounter
Admission: RE | Disposition: A | Discharge status: Home / Self Care | Payer: Self-pay | Source: Ambulatory Visit | Attending: Urology

## 2017-08-16 ENCOUNTER — Other Ambulatory Visit: Payer: Self-pay

## 2017-08-16 ENCOUNTER — Ambulatory Visit: Payer: Medicare PPO | Admitting: Anesthesiology

## 2017-08-16 ENCOUNTER — Ambulatory Visit
Admission: RE | Admit: 2017-08-16 | Discharge: 2017-08-16 | Disposition: A | Discharge status: Home / Self Care | Payer: Medicare PPO | Source: Ambulatory Visit | Attending: Urology | Admitting: Urology

## 2017-08-16 DIAGNOSIS — C7951 Secondary malignant neoplasm of bone: Secondary | ICD-10-CM | POA: Diagnosis not present

## 2017-08-16 DIAGNOSIS — Z87891 Personal history of nicotine dependence: Secondary | ICD-10-CM | POA: Diagnosis not present

## 2017-08-16 DIAGNOSIS — N200 Calculus of kidney: Secondary | ICD-10-CM | POA: Diagnosis not present

## 2017-08-16 DIAGNOSIS — I350 Nonrheumatic aortic (valve) stenosis: Secondary | ICD-10-CM | POA: Insufficient documentation

## 2017-08-16 DIAGNOSIS — C61 Malignant neoplasm of prostate: Secondary | ICD-10-CM | POA: Insufficient documentation

## 2017-08-16 DIAGNOSIS — I10 Essential (primary) hypertension: Secondary | ICD-10-CM | POA: Insufficient documentation

## 2017-08-16 DIAGNOSIS — E7800 Pure hypercholesterolemia, unspecified: Secondary | ICD-10-CM | POA: Insufficient documentation

## 2017-08-16 DIAGNOSIS — Z8546 Personal history of malignant neoplasm of prostate: Secondary | ICD-10-CM | POA: Diagnosis not present

## 2017-08-16 DIAGNOSIS — N135 Crossing vessel and stricture of ureter without hydronephrosis: Secondary | ICD-10-CM | POA: Diagnosis not present

## 2017-08-16 DIAGNOSIS — Z79899 Other long term (current) drug therapy: Secondary | ICD-10-CM | POA: Insufficient documentation

## 2017-08-16 DIAGNOSIS — I4891 Unspecified atrial fibrillation: Secondary | ICD-10-CM | POA: Diagnosis not present

## 2017-08-16 DIAGNOSIS — I252 Old myocardial infarction: Secondary | ICD-10-CM | POA: Insufficient documentation

## 2017-08-16 DIAGNOSIS — I4892 Unspecified atrial flutter: Secondary | ICD-10-CM | POA: Diagnosis not present

## 2017-08-16 DIAGNOSIS — K219 Gastro-esophageal reflux disease without esophagitis: Secondary | ICD-10-CM | POA: Insufficient documentation

## 2017-08-16 DIAGNOSIS — C7910 Secondary malignant neoplasm of unspecified urinary organs: Secondary | ICD-10-CM | POA: Diagnosis not present

## 2017-08-16 DIAGNOSIS — E119 Type 2 diabetes mellitus without complications: Secondary | ICD-10-CM | POA: Insufficient documentation

## 2017-08-16 DIAGNOSIS — N131 Hydronephrosis with ureteral stricture, not elsewhere classified: Secondary | ICD-10-CM | POA: Diagnosis present

## 2017-08-16 DIAGNOSIS — N1330 Unspecified hydronephrosis: Secondary | ICD-10-CM

## 2017-08-16 HISTORY — PX: CYSTOSCOPY WITH STENT PLACEMENT: SHX5790

## 2017-08-16 LAB — GLUCOSE, CAPILLARY
GLUCOSE-CAPILLARY: 109 mg/dL — AB (ref 65–99)
Glucose-Capillary: 140 mg/dL — ABNORMAL HIGH (ref 65–99)

## 2017-08-16 SURGERY — CYSTOSCOPY, WITH STENT INSERTION
Anesthesia: General | Site: Ureter | Laterality: Left | Wound class: Clean Contaminated

## 2017-08-16 MED ORDER — CEFAZOLIN SODIUM-DEXTROSE 2-4 GM/100ML-% IV SOLN
INTRAVENOUS | Status: AC
  Filled 2017-08-16: qty 100

## 2017-08-16 MED ORDER — MIDAZOLAM HCL 2 MG/2ML IJ SOLN
INTRAMUSCULAR | Status: DC | PRN
  Administered 2017-08-16: 1 mg via INTRAVENOUS

## 2017-08-16 MED ORDER — GLYCOPYRROLATE 0.2 MG/ML IJ SOLN
INTRAMUSCULAR | Status: DC | PRN
  Administered 2017-08-16: 0.1 mg via INTRAVENOUS

## 2017-08-16 MED ORDER — ONDANSETRON HCL 4 MG/2ML IJ SOLN
INTRAMUSCULAR | Status: AC
  Filled 2017-08-16: qty 2

## 2017-08-16 MED ORDER — ONDANSETRON HCL 4 MG/2ML IJ SOLN
INTRAMUSCULAR | Status: DC | PRN
  Administered 2017-08-16: 4 mg via INTRAVENOUS

## 2017-08-16 MED ORDER — ONDANSETRON HCL 4 MG/2ML IJ SOLN: 4.0000 mg | Freq: Once | INTRAMUSCULAR | Status: DC | PRN

## 2017-08-16 MED ORDER — MIDAZOLAM HCL 2 MG/2ML IJ SOLN
INTRAMUSCULAR | Status: AC
  Filled 2017-08-16: qty 2

## 2017-08-16 MED ORDER — FENTANYL CITRATE (PF) 100 MCG/2ML IJ SOLN
INTRAMUSCULAR | Status: AC
  Filled 2017-08-16: qty 2

## 2017-08-16 MED ORDER — LACTATED RINGERS IV SOLN
INTRAVENOUS | Status: DC
  Administered 2017-08-16: 10:00:00 via INTRAVENOUS

## 2017-08-16 MED ORDER — FENTANYL CITRATE (PF) 100 MCG/2ML IJ SOLN
INTRAMUSCULAR | Status: DC | PRN
  Administered 2017-08-16: 50 ug via INTRAVENOUS

## 2017-08-16 MED ORDER — IOTHALAMATE MEGLUMINE 43 % IV SOLN
INTRAVENOUS | Status: DC | PRN
  Administered 2017-08-16: 15 mL via URETHRAL

## 2017-08-16 MED ORDER — FENTANYL CITRATE (PF) 100 MCG/2ML IJ SOLN: 25.0000 ug | INTRAMUSCULAR | Status: DC | PRN

## 2017-08-16 SURGICAL SUPPLY — 22 items
BAG DRAIN CYSTO-URO LG1000N (MISCELLANEOUS) ×3 IMPLANT
BRUSH SCRUB EZ  4% CHG (MISCELLANEOUS) ×2
BRUSH SCRUB EZ 4% CHG (MISCELLANEOUS) ×1 IMPLANT
CATH URETL 5X70 OPEN END (CATHETERS) ×3 IMPLANT
CONRAY 43 FOR UROLOGY 50M (MISCELLANEOUS) ×3 IMPLANT
GLOVE BIOGEL PI IND STRL 8 (GLOVE) ×1 IMPLANT
GLOVE BIOGEL PI INDICATOR 8 (GLOVE) ×2
GOWN STANDARD XL  REUSABL (MISCELLANEOUS) ×3 IMPLANT
KIT RM TURNOVER CYSTO AR (KITS) ×3 IMPLANT
PACK CYSTO AR (MISCELLANEOUS) ×3 IMPLANT
SENSORWIRE 0.038 NOT ANGLED (WIRE) ×3
SET CYSTO W/LG BORE CLAMP LF (SET/KITS/TRAYS/PACK) ×3 IMPLANT
SOL .9 NS 3000ML IRR  AL (IV SOLUTION) ×2
SOL .9 NS 3000ML IRR AL (IV SOLUTION) ×1
SOL .9 NS 3000ML IRR UROMATIC (IV SOLUTION) ×1 IMPLANT
STENT URET 6FRX24 CONTOUR (STENTS) IMPLANT
STENT URET 6FRX26 CONTOUR (STENTS) IMPLANT
STENT URO INLAY 6FRX26CM (STENTS) ×3 IMPLANT
SURGILUBE 2OZ TUBE FLIPTOP (MISCELLANEOUS) ×3 IMPLANT
SYRINGE IRR TOOMEY STRL 70CC (SYRINGE) ×3 IMPLANT
WATER STERILE IRR 1000ML POUR (IV SOLUTION) ×3 IMPLANT
WIRE SENSOR 0.038 NOT ANGLED (WIRE) ×1 IMPLANT

## 2017-08-16 NOTE — Interval H&P Note (Signed)
History and Physical Interval Note:  08/16/2017 9:47 AM  [name]  has presented today for surgery, with the diagnosis of Hydronephrosis secondary to prostate cancer  The various methods of treatment have been discussed with the patient and family. After consideration of risks, benefits and other options for treatment, the patient has consented to  Procedure(s): CYSTOSCOPY WITH STENT EXCHANGE (Left) as a surgical intervention .  The patient's history has been reviewed, patient examined, no change in status, stable for surgery.  I have reviewed the patient's chart and labs.  Questions were answered to the patient's satisfaction.     Accord

## 2017-08-16 NOTE — H&P (Signed)
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   He had a radical prostatectomy in 1992. We do not have records from this procedure. The patient does not remember where this was done.   He is under the care of the cancer center receiving Lupron and Xgeva for his Stage IVcastrate sensitive metastatic prostate cancer. Most recent PSA in Nov 2018 with 0.92.   PMH: Past Medical History:  Diagnosis Date  . Aortic stenosis, moderate 01/30/2014  . Arthritis   . Atrial flutter ([place]) 02/02/2014  . Diabetes mellitus without complication (HCC)    diet control  . GERD (gastroesophageal reflux disease)   . Heart murmur   . Heart murmur   . HTN (hypertension) 01/30/2014  . Hypercholesteremia   . Hypertension   . Prostate cancer ([place])    metastatic  . Weakness of left leg     Surgical History: Past Surgical History:  Procedure Laterality Date  . CATARACT EXTRACTION W/ INTRAOCULAR LENS IMPLANT Bilateral   . COLON RESECTION    . CYSTOSCOPY W/ RETROGRADES Left 06/08/2015   Procedure: CYSTOSCOPY WITH RETROGRADE PYELOGRAM;  Surgeon: Nickie Retort, MD;  Location: [facility];  Service: Urology;  Laterality: Left;  . CYSTOSCOPY W/ URETERAL STENT PLACEMENT Left 10/05/2015   Procedure: CYSTOSCOPY WITH STENT REPLACEMENT;  Surgeon: Nickie Retort, MD;  Location: [facility];  Service: Urology;  Laterality: Left;  . CYSTOSCOPY W/ URETERAL STENT PLACEMENT Left 01/04/2016   Procedure: CYSTOSCOPY WITH STENT REPLACEMENT;  Surgeon: Nickie Retort, MD;  Location: [facility];  Service: Urology;  Laterality: Left;  . CYSTOSCOPY W/ URETERAL STENT PLACEMENT Left 04/11/2016   Procedure: CYSTOSCOPY WITH STENT REPLACEMENT;   Surgeon: Nickie Retort, MD;  Location: [facility];  Service: Urology;  Laterality: Left;  . CYSTOSCOPY W/ URETERAL STENT PLACEMENT Left 07/18/2016   Procedure: CYSTOSCOPY WITH STENT REPLACEMENT;  Surgeon: Nickie Retort, MD;  Location: [facility];  Service: Urology;  Laterality: Left;  . CYSTOSCOPY W/ URETERAL STENT PLACEMENT Left 11/02/2016   Procedure: CYSTOSCOPY WITH STENT REPLACEMENT;  Surgeon: Nickie Retort, MD;  Location: [facility];  Service: Urology;  Laterality: Left;  . CYSTOSCOPY W/ URETERAL STENT PLACEMENT Left 02/08/2017   Procedure: CYSTOSCOPY WITH STENT REPLACEMENT;  Surgeon: Nickie Retort, MD;  Location: [facility];  Service: Urology;  Laterality: Left;  . CYSTOSCOPY W/ URETERAL STENT PLACEMENT Left 05/10/2017   Procedure: CYSTOSCOPY WITH STENT REPLACEMENT;  Surgeon: Nickie Retort, MD;  Location: [facility];  Service: Urology;  Laterality: Left;  . EYE SURGERY Bilateral Sept and Oct.2012   cataract  . HERNIA REPAIR    . PROSTATECTOMY  1994    Home Medications:  Reviewed  Allergies: No Known Allergies  Family History: Family History  Problem Relation Age of Onset  . Aneurysm Father   . Cancer Mother        breast    Social History:  reports that he quit smoking about 43 years ago. His smoking use included cigarettes. He has a 37.50 pack-year smoking history. he has never used smokeless tobacco. He reports that he does not drink alcohol or use drugs.  ROS: Otherwise noncontributory   Physical Exam: BP 140/67   Pulse 76  Temp 97.7 F (36.5 C) (Temporal)   Resp 16   Wt 186 lb (84.4 kg)   SpO2 96%   BMI 25.94 kg/m   Constitutional:  Alert and oriented, No acute distress. HEENT: [place] AT, moist mucus membranes.  Trachea midline, no masses. Cardiovascular: No clubbing, cyanosis, or edema. CV RRR Respiratory: Normal respiratory effort, no increased work of breathing.  Lungs clear GI: Abdomen is soft, nontender, nondistended, no abdominal masses GU:  No CVA tenderness.  Skin: No rashes, bruises or suspicious lesions. Lymph: No cervical or inguinal adenopathy. Neurologic: Grossly intact, no focal deficits, moving all 4 extremities. Psychiatric: Normal mood and affect.  Laboratory Data: Lab Results  Component Value Date   WBC 10.5 08/02/2017   HGB 14.1 08/02/2017   HCT 43.1 08/02/2017   MCV 87.2 08/02/2017   PLT 192 08/02/2017    Lab Results  Component Value Date   CREATININE 1.30 (H) 08/02/2017     Results for orders placed during the hospital encounter of 02/23/17  US Renal   Narrative CLINICAL DATA:  Flank pain on left. Metastatic prostate carcinoma. Left renal stent in place  EXAM: RENAL / URINARY TRACT ULTRASOUND COMPLETE  COMPARISON:  September 12, 2015  FINDINGS: Right Kidney:  Length: 11.0 cm. Echogenicity and renal cortical thickness are within normal limits. No perinephric fluid or hydronephrosis visualized. There is a cyst arising from the upper pole right kidney measuring 3.8 x 3.2 x 3.3 cm. There is a calculus in the mid right kidney measuring 9 mm. No ureterectasis.  Left Kidney:  Length: 9.5 cm. Echogenicity and renal cortical thickness are within normal limits. No perinephric fluid or hydronephrosis visualized. There is a cyst arising in the mid to lower pole left kidney measuring 3.2 x 4.2 x 3.7 cm. A smaller cyst slightly more superior in location measures 0.8 x 0.9 x 1.1 cm. Double-J stent present on left.  Bladder:  Along the posterior wall of the urinary bladder, there is a the hypoechoic structure measuring 2.9 x 2.9 x 0.9 cm. No other lesions seen in the urinary bladder. Stent extends into the bladder from the left side.  IMPRESSION: Focal area of decreased echogenicity along the posterior wall of the urinary bladder measuring 2.9 x 2.9 x 0.9 cm. This area does not move. A small mass arising from the posterior wall of the urinary bladder must be of concern given this appearance. This  finding may warrant direct visualization to further assess. In this regard, urologic consultation advised.  Double-J stent on left. Cysts in each kidney. Nonobstructing calculus right kidney. No obstructing focus in either kidney.   Electronically Signed   By: Lowella Grip III M.D.   On: 02/23/2017 14:41    Assessment & Plan:   He presents for cystoscopy with exchange of his left ureteral stent.  Procedure was discussed in detail including potential risks of bleeding, infection/sepsis as well as the risks of anesthesia.  He indicated all questions were answered and desires to proceed.  Abbie Sons, [place] [address] [place], [place] [address] [phone]

## 2017-08-16 NOTE — Transfer of Care (Signed)
Immediate Anesthesia Transfer of Care Note  Patient: [name]  Procedure(s) Performed: CYSTOSCOPY WITH STENT EXCHANGE (Left Ureter)  Patient Location: PACU  Anesthesia Type:General  Level of Consciousness: awake, alert  and oriented  Airway & Oxygen Therapy: Patient Spontanous Breathing and Patient connected to face mask oxygen  Post-op Assessment: Report given to RN, Post -op Vital signs reviewed and stable and Patient moving all extremities  Post vital signs: Reviewed and stable  Last Vitals:  Vitals:   08/16/17 0939 08/16/17 1037  BP: 140/67 (!) 142/82  Pulse:  71  Resp:  (!) 6  Temp:    SpO2:  100%    Last Pain:  Vitals:   08/16/17 0933  TempSrc: Temporal  PainSc: 6          Complications: No apparent anesthesia complications

## 2017-08-16 NOTE — Discharge Instructions (Signed)

## 2017-08-16 NOTE — Anesthesia Preprocedure Evaluation (Signed)
Anesthesia Evaluation  Patient identified by MRN, date of birth, ID band Patient awake    Reviewed: Allergy & Precautions, NPO status , Patient's Chart, lab work & pertinent test results, reviewed documented beta blocker date and time   Airway Mallampati: III  TM Distance: >3 FB     Dental  (+) Chipped   Pulmonary former smoker,           Cardiovascular hypertension, Pt. on medications and Pt. on home beta blockers + Past MI  + dysrhythmias Atrial Fibrillation      Neuro/Psych    GI/Hepatic   Endo/Other  diabetes  Renal/GU      Musculoskeletal   Abdominal   Peds  Hematology   Anesthesia Other Findings Has AS. Duragesic patch.  Reproductive/Obstetrics                             Anesthesia Physical Anesthesia Plan  ASA: III  Anesthesia Plan: General   Post-op Pain Management:    Induction: Intravenous  PONV Risk Score and Plan:   Airway Management Planned: LMA  Additional Equipment:   Intra-op Plan:   Post-operative Plan:   Informed Consent: I have reviewed the patients History and Physical, chart, labs and discussed the procedure including the risks, benefits and alternatives for the proposed anesthesia with the patient or authorized representative who has indicated his/her understanding and acceptance.     Plan Discussed with: CRNA  Anesthesia Plan Comments:         Anesthesia Quick Evaluation

## 2017-08-16 NOTE — Anesthesia Post-op Follow-up Note (Signed)
Anesthesia QCDR form completed.        

## 2017-08-16 NOTE — Op Note (Signed)
Date of procedure: 08/16/17  Preoperative diagnosis:  1.  Left ureteral obstruction secondary to metastatic prostate cancer  Postoperative diagnosis:        1.    Left ureteral obstruction secondary to metastatic prostate cancer  Procedure: 1. Cystoscopy with removal/replacement left ureteral stent 2. Left retrograde pyelogram  Surgeon: [name], MD  Anesthesia: General  Complications: None  Intraoperative findings: Retrograde pyelogram shows no significant hydronephrosis.  EBL: None  Specimens: None  Drains: 6 French/26 cm ureteral stent  Indication: [name] is a 76 y.o. 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   After reviewing the management options for treatment, he elected to proceed with the above surgical procedure(s). We have discussed the potential benefits and risks of the procedure, side effects of the proposed treatment, the likelihood of the patient achieving the goals of the procedure, and any potential problems that might occur during the procedure or recuperation. Informed consent has been obtained.  Description of procedure:  The patient was taken to the operating room and general anesthesia was induced.  The patient was placed in the dorsal lithotomy position, prepped and draped in the usual sterile fashion, and preoperative antibiotics were administered. A preoperative time-out was performed.   A 22 French cystoscope was lubricated and passed per urethra.  The urethra was normal in caliber without stricture.  The prostate was surgically absent.  As the bladder was emptied the ureteral stent was easily visualized.  There were no bladder mucosal lesions noted.  The stent was grasped with endoscopic forceps and brought out through the urethral meatus.  Due to  encrustation a guidewire could not be placed through the stent and the stent was removed.  The cystoscope was repassed and a 0.038 sensor wire was placed into the left ureteral orifice and advanced to the renal pelvis with position verified by fluoroscopy.  A 6 French open-ended ureteral catheter was placed over the guidewire and the guidewire was removed.  Retrograde pyelogram was performed with findings as described above.  The guidewire was replaced and the ureteral catheter was removed.  A 6 French/26 cm double-J ureteral stent was then placed without difficulty.  There was good positioning noted proximally and distally.  The bladder was emptied and the cystoscope was removed.  After anesthetic reversal he was transported to PACU in stable condition.  [name], M.D.

## 2017-08-16 NOTE — Anesthesia Postprocedure Evaluation (Signed)
Anesthesia Post Note  Patient: [name]  Procedure(s) Performed: CYSTOSCOPY WITH STENT EXCHANGE (Left Ureter)  Patient location during evaluation: PACU Anesthesia Type: General Level of consciousness: awake and alert Pain management: pain level controlled Vital Signs Assessment: post-procedure vital signs reviewed and stable Respiratory status: spontaneous breathing, nonlabored ventilation, respiratory function stable and patient connected to nasal cannula oxygen Cardiovascular status: blood pressure returned to baseline and stable Postop Assessment: no apparent nausea or vomiting Anesthetic complications: no     Last Vitals:  Vitals:   08/16/17 1136 08/16/17 1158  BP: (!) 159/84 (!) 166/81  Pulse: 74 65  Resp: 16 16  Temp: 36.7 C 36.6 C  SpO2: 100% 100%    Last Pain:  Vitals:   08/16/17 1158  TempSrc: Temporal  PainSc:                  [name] S

## 2017-08-23 ENCOUNTER — Telehealth: Payer: Self-pay | Admitting: Internal Medicine

## 2017-08-23 NOTE — Telephone Encounter (Signed)
Left a message for the patient' wife regarding the results of the PET scan; recommend radiation. Keep appts with me as planned.   H/B-  Please make radiation referral.

## 2017-08-23 NOTE — Telephone Encounter (Signed)
msg sent to scheduling team to arrange apt to radiation oncology

## 2017-08-27 ENCOUNTER — Other Ambulatory Visit: Payer: Self-pay

## 2017-08-27 ENCOUNTER — Encounter: Payer: Self-pay | Admitting: Radiation Oncology

## 2017-08-27 ENCOUNTER — Ambulatory Visit
Admission: RE | Admit: 2017-08-27 | Discharge: 2017-08-27 | Disposition: A | Discharge status: Home / Self Care | Payer: PPO | Source: Ambulatory Visit | Attending: Radiation Oncology | Admitting: Radiation Oncology

## 2017-08-27 VITALS — BP 154/86 | HR 63 | Temp 96.5°F | Resp 18 | Wt 191.0 lb

## 2017-08-27 DIAGNOSIS — C7951 Secondary malignant neoplasm of bone: Secondary | ICD-10-CM | POA: Insufficient documentation

## 2017-08-27 DIAGNOSIS — Z510 Encounter for antineoplastic radiation therapy: Secondary | ICD-10-CM | POA: Insufficient documentation

## 2017-08-27 DIAGNOSIS — Z923 Personal history of irradiation: Secondary | ICD-10-CM | POA: Diagnosis not present

## 2017-08-27 DIAGNOSIS — C61 Malignant neoplasm of prostate: Secondary | ICD-10-CM | POA: Diagnosis not present

## 2017-08-27 NOTE — Progress Notes (Signed)
Radiation Oncology Follow up Note  Name: [name]   Date:   08/27/2017 MRN:  [id number] DOB: [birth year]    [name] presents to the clinic today for evaluation of his left hip for metastatic stage IV prostate cancer castrate resistant in patient with previous radiation treatments to his lumbar spine for palliation.  REFERRING PROVIDER: Tamsen Roers, MD  HPI: Patient is a 77 year old male well known to our department having been treated back in July 2018 to his lumbar spine for involvement of metastatic prostate cancer.Marland Kitchen He was recent started in November on Zytiga and prednisone and is tolerating that fairly well. He also complains of increasing pain in his left hip and recent axumin scan showed new avid skeletal metastasis of the T9 vertebral body pathologic right rib fracture. He's had excellent response in the L4 region which was previously radiated. A my review he does have increased uptake in his left acetabulum.  COMPLICATIONS OF TREATMENT: none  FOLLOW UP COMPLIANCE: keeps appointments   PHYSICAL EXAM:  BP (!) 154/86   Pulse 63   Temp (!) 96.5 F (35.8 C)   Resp 18   Wt 191 lb 0.5 oz (86.7 kg)   BMI 26.64 kg/m  On range of motion of his left lower extremity or his pain elicited. Motor and sensory levels are equal and symmetric in lower extremities proprioception is intact. Well-developed well-nourished patient in NAD. HEENT reveals PERLA, EOMI, discs not visualized.  Oral cavity is clear. No oral mucosal lesions are identified. Neck is clear without evidence of cervical or supraclavicular adenopathy. Lungs are clear to A&P. Cardiac examination is essentially unremarkable with regular rate and rhythm without murmur rub or thrill. Abdomen is benign with no organomegaly or masses noted. Motor sensory and DTR levels are equal and symmetric in the upper and lower extremities. Cranial nerves II through XII are grossly intact. Proprioception is intact. No peripheral adenopathy  or edema is identified. No motor or sensory levels are noted. Crude visual fields are within normal range.  RADIOLOGY RESULTS: PET scan is reviewed and compatible with the above-stated findings  PLAN: At this time I to go ahead with it palliative course of radiation therapy to his left hip. Would plan on delivering 3000 cGy in 10 fractions. He has multiple areas of metastatic involvement and may be a candidate for Xofigo. I've. Briefly discussed the 6 injection protocol and will discuss this with medical oncology. I have personally set up and ordered CT simulation for tomorrow. Risks and benefits of treatment including skin reaction fatigue alteration of blood counts all were discussed in detail with the patient. He and his wife both seem to comprehend my treatment plan well.  I would like to take this opportunity to thank you for allowing me to participate in the care of your patient.Noreene Filbert, MD

## 2017-08-28 ENCOUNTER — Ambulatory Visit
Admission: RE | Admit: 2017-08-28 | Discharge: 2017-08-28 | Disposition: A | Discharge status: Home / Self Care | Payer: PPO | Source: Ambulatory Visit | Attending: Radiation Oncology | Admitting: Radiation Oncology

## 2017-08-28 DIAGNOSIS — C7951 Secondary malignant neoplasm of bone: Secondary | ICD-10-CM | POA: Diagnosis not present

## 2017-08-28 DIAGNOSIS — C61 Malignant neoplasm of prostate: Secondary | ICD-10-CM | POA: Diagnosis not present

## 2017-08-28 DIAGNOSIS — Z510 Encounter for antineoplastic radiation therapy: Secondary | ICD-10-CM | POA: Diagnosis not present

## 2017-08-29 ENCOUNTER — Other Ambulatory Visit: Payer: Self-pay | Admitting: *Deleted

## 2017-08-29 DIAGNOSIS — C7952 Secondary malignant neoplasm of bone marrow: Principal | ICD-10-CM

## 2017-08-29 DIAGNOSIS — C7951 Secondary malignant neoplasm of bone: Secondary | ICD-10-CM

## 2017-08-30 ENCOUNTER — Inpatient Hospital Stay: Payer: PPO

## 2017-08-30 ENCOUNTER — Inpatient Hospital Stay: Payer: PPO | Attending: Internal Medicine | Admitting: Internal Medicine

## 2017-08-30 ENCOUNTER — Other Ambulatory Visit: Payer: Self-pay

## 2017-08-30 VITALS — BP 155/87 | HR 72 | Temp 97.8°F | Resp 20 | Ht 71.0 in | Wt 192.4 lb

## 2017-08-30 DIAGNOSIS — E119 Type 2 diabetes mellitus without complications: Secondary | ICD-10-CM | POA: Diagnosis not present

## 2017-08-30 DIAGNOSIS — K219 Gastro-esophageal reflux disease without esophagitis: Secondary | ICD-10-CM

## 2017-08-30 DIAGNOSIS — I350 Nonrheumatic aortic (valve) stenosis: Secondary | ICD-10-CM

## 2017-08-30 DIAGNOSIS — Z79899 Other long term (current) drug therapy: Secondary | ICD-10-CM | POA: Diagnosis not present

## 2017-08-30 DIAGNOSIS — I4892 Unspecified atrial flutter: Secondary | ICD-10-CM | POA: Insufficient documentation

## 2017-08-30 DIAGNOSIS — C7951 Secondary malignant neoplasm of bone: Secondary | ICD-10-CM | POA: Insufficient documentation

## 2017-08-30 DIAGNOSIS — Z8781 Personal history of (healed) traumatic fracture: Secondary | ICD-10-CM | POA: Insufficient documentation

## 2017-08-30 DIAGNOSIS — C61 Malignant neoplasm of prostate: Secondary | ICD-10-CM

## 2017-08-30 DIAGNOSIS — I129 Hypertensive chronic kidney disease with stage 1 through stage 4 chronic kidney disease, or unspecified chronic kidney disease: Secondary | ICD-10-CM | POA: Insufficient documentation

## 2017-08-30 DIAGNOSIS — J439 Emphysema, unspecified: Secondary | ICD-10-CM | POA: Insufficient documentation

## 2017-08-30 DIAGNOSIS — Z87891 Personal history of nicotine dependence: Secondary | ICD-10-CM | POA: Insufficient documentation

## 2017-08-30 DIAGNOSIS — E7800 Pure hypercholesterolemia, unspecified: Secondary | ICD-10-CM | POA: Diagnosis not present

## 2017-08-30 DIAGNOSIS — N1330 Unspecified hydronephrosis: Secondary | ICD-10-CM

## 2017-08-30 DIAGNOSIS — R011 Cardiac murmur, unspecified: Secondary | ICD-10-CM | POA: Insufficient documentation

## 2017-08-30 DIAGNOSIS — Z7982 Long term (current) use of aspirin: Secondary | ICD-10-CM | POA: Diagnosis not present

## 2017-08-30 DIAGNOSIS — N183 Chronic kidney disease, stage 3 (moderate): Secondary | ICD-10-CM | POA: Insufficient documentation

## 2017-08-30 DIAGNOSIS — I700 Atherosclerosis of aorta: Secondary | ICD-10-CM | POA: Insufficient documentation

## 2017-08-30 LAB — COMPREHENSIVE METABOLIC PANEL
ALT: 11 U/L — AB (ref 17–63)
AST: 22 U/L (ref 15–41)
Albumin: 3.7 g/dL (ref 3.5–5.0)
Alkaline Phosphatase: 68 U/L (ref 38–126)
Anion gap: 10 (ref 5–15)
BILIRUBIN TOTAL: 0.7 mg/dL (ref 0.3–1.2)
BUN: 30 mg/dL — ABNORMAL HIGH (ref 6–20)
CALCIUM: 9.8 mg/dL (ref 8.9–10.3)
CHLORIDE: 101 mmol/L (ref 101–111)
CO2: 26 mmol/L (ref 22–32)
Creatinine, Ser: 1.19 mg/dL (ref 0.61–1.24)
GFR, EST NON AFRICAN AMERICAN: 58 mL/min — AB (ref >60)
Glucose, Bld: 128 mg/dL — ABNORMAL HIGH (ref 65–99)
Potassium: 3.4 mmol/L — ABNORMAL LOW (ref 3.5–5.1)
Sodium: 137 mmol/L (ref 135–145)
TOTAL PROTEIN: 7 g/dL (ref 6.5–8.1)

## 2017-08-30 LAB — CBC WITH DIFFERENTIAL/PLATELET
BASOS ABS: 0.1 10*3/uL (ref 0–0.1)
BASOS PCT: 1 %
EOS ABS: 0.4 10*3/uL (ref 0–0.7)
Eosinophils Relative: 4 %
HCT: 42.2 % (ref 40.0–52.0)
HEMOGLOBIN: 13.8 g/dL (ref 13.0–18.0)
Lymphocytes Relative: 19 %
Lymphs Abs: 1.8 10*3/uL (ref 1.0–3.6)
MCH: 28.9 pg (ref 26.0–34.0)
MCHC: 32.8 g/dL (ref 32.0–36.0)
MCV: 88.1 fL (ref 80.0–100.0)
Monocytes Absolute: 0.9 10*3/uL (ref 0.2–1.0)
Monocytes Relative: 9 %
NEUTROS PCT: 67 %
Neutro Abs: 6.3 10*3/uL (ref 1.4–6.5)
PLATELETS: 159 10*3/uL (ref 150–440)
RBC: 4.79 MIL/uL (ref 4.40–5.90)
RDW: 17.3 % — ABNORMAL HIGH (ref 11.5–14.5)
WBC: 9.5 10*3/uL (ref 3.8–10.6)

## 2017-08-30 LAB — PSA: PROSTATIC SPECIFIC ANTIGEN: 1.73 ng/mL (ref 0.00–4.00)

## 2017-08-30 MED ORDER — DENOSUMAB 120 MG/1.7ML ~~LOC~~ SOLN
120.0000 mg | Freq: Once | SUBCUTANEOUS | Status: AC
  Administered 2017-08-30: 120 mg via SUBCUTANEOUS
  Filled 2017-08-30: qty 1.7

## 2017-08-30 MED ORDER — FENTANYL 50 MCG/HR TD PT72: 50.0000 ug | MEDICATED_PATCH | TRANSDERMAL | 0 refills | Status: DC

## 2017-08-30 MED ORDER — HYDROCODONE-ACETAMINOPHEN 5-325 MG PO TABS: 1.0000 | ORAL_TABLET | Freq: Three times a day (TID) | ORAL | 0 refills | Status: DC | PRN

## 2017-08-30 NOTE — Assessment & Plan Note (Addendum)
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 NOv 11th 2018-T spine MRI shows progressive lesion in his thoracic spine. Aux PET- right post rib uptake [not symptomatic]; left actebular uptake [symptomatic]; [PSA not a good marker];   # continue zytiga+prednisone; tolerating without any side effects.   #Left hip pain/acetabular uptake-awaiting radiation starting next week.  Discussed the role of [name] however would continue Zytiga at this time.  # Bone mets-Xgeva q. Monthly.  Tolerating it without any side effects.  #Pain control-increase the dose of fentanyl to 50 mcg;and also continue current dose of Percocet 7.5 325 every 8 hours. New scripts given.   # Left ureteral stent/hydronephrosis- likely from pelvic adenopathy from his malignancy/ CKD- stage III- creat 1.1. Reviewed the labs.   # follow up in 4 weeks/labs/ X-geva.

## 2017-08-30 NOTE — Progress Notes (Signed)
[place] OFFICE PROGRESS NOTE  Patient Care Team: Tamsen Roers, MD as PCP - General (Family Medicine)   SUMMARY OF ONCOLOGIC HISTORY:  Oncology History   # AUG 2016- METASTATIC PROSTATE CA/ STAGE IV; Castrate sensitive [PSA- 48] ; mets- lumbar spine [s/p pal RT to Aug 2016; Dr.Crystal] /Pelvic LN; 1994- Prostate CA s/p Surgery [facility]]; Lupron q4 M; MARCH 2017- Bone scan- L4/Right pubic rami uptake; PSA- 0.1/testosterone-castrate [<3]  # Bone lesions on denosumab;DEC 2016- Recm q 63M  # NOV 2018- CASTRATE RESISTANT; declined chemo; 08/06/2017- start Zytiga+prednisone  # Left hydronephrosis s/p stenting [Dr.Budzyn]     Prostate cancer metastatic to bone [facility])    INTERVAL HISTORY:  A very pleasant 77 year old male patient with above history of prostate cancer stage IV hormone sensitive currently on Lupron every 4 months-is here for follow-up.  In the interim he has been evaluated by radiation oncology.  He is awaiting to start external beam RT to his left acetabular area next week.  Patient states his hip pain is improved.  Is currently on fentanyl patch 50 mcg; and also hydrocodone 2-3 a day.  No nausea no vomiting. Denies any weakness in one particular arm or leg.  REVIEW OF SYSTEMS:  A complete 10 point review of system is done which is negative except mentioned above/history of present illness.   PAST MEDICAL HISTORY :  Past Medical History:  Diagnosis Date  . Aortic stenosis, moderate 01/30/2014  . Arthritis   . Atrial flutter ([place]) 02/02/2014  . Diabetes mellitus without complication (HCC)    diet control  . GERD (gastroesophageal reflux disease)   . Heart murmur   . Heart murmur   . HTN (hypertension) 01/30/2014  . Hypercholesteremia   . Hypertension   . Prostate cancer ([place])    metastatic  . Weakness of left leg     PAST SURGICAL HISTORY :   Past Surgical History:  Procedure Laterality Date  . CATARACT EXTRACTION W/ INTRAOCULAR LENS  IMPLANT Bilateral   . COLON RESECTION    . CYSTOSCOPY W/ RETROGRADES Left 06/08/2015   Procedure: CYSTOSCOPY WITH RETROGRADE PYELOGRAM;  Surgeon: Nickie Retort, MD;  Location: [facility];  Service: Urology;  Laterality: Left;  . CYSTOSCOPY W/ URETERAL STENT PLACEMENT Left 10/05/2015   Procedure: CYSTOSCOPY WITH STENT REPLACEMENT;  Surgeon: Nickie Retort, MD;  Location: [facility];  Service: Urology;  Laterality: Left;  . CYSTOSCOPY W/ URETERAL STENT PLACEMENT Left 01/04/2016   Procedure: CYSTOSCOPY WITH STENT REPLACEMENT;  Surgeon: Nickie Retort, MD;  Location: [facility];  Service: Urology;  Laterality: Left;  . CYSTOSCOPY W/ URETERAL STENT PLACEMENT Left 04/11/2016   Procedure: CYSTOSCOPY WITH STENT REPLACEMENT;  Surgeon: Nickie Retort, MD;  Location: [facility];  Service: Urology;  Laterality: Left;  . CYSTOSCOPY W/ URETERAL STENT PLACEMENT Left 07/18/2016   Procedure: CYSTOSCOPY WITH STENT REPLACEMENT;  Surgeon: Nickie Retort, MD;  Location: [facility];  Service: Urology;  Laterality: Left;  . CYSTOSCOPY W/ URETERAL STENT PLACEMENT Left 11/02/2016   Procedure: CYSTOSCOPY WITH STENT REPLACEMENT;  Surgeon: Nickie Retort, MD;  Location: [facility];  Service: Urology;  Laterality: Left;  . CYSTOSCOPY W/ URETERAL STENT PLACEMENT Left 02/08/2017   Procedure: CYSTOSCOPY WITH STENT REPLACEMENT;  Surgeon: Nickie Retort, MD;  Location: [facility];  Service: Urology;  Laterality: Left;  . CYSTOSCOPY W/ URETERAL STENT PLACEMENT Left 05/10/2017   Procedure: CYSTOSCOPY WITH STENT REPLACEMENT;  Surgeon: Nickie Retort, MD;  Location: [facility];  Service: Urology;  Laterality: Left;  . CYSTOSCOPY WITH STENT PLACEMENT Left 08/16/2017   Procedure: CYSTOSCOPY WITH STENT EXCHANGE;  Surgeon: Abbie Sons, MD;  Location: [facility];  Service: Urology;  Laterality: Left;  . EYE SURGERY Bilateral Sept and Oct.2012   cataract  . HERNIA REPAIR    . PROSTATECTOMY  1994    FAMILY HISTORY :    Family History  Problem Relation Age of Onset  . Aneurysm Father   . Cancer Mother        breast    SOCIAL HISTORY:   Social History   Tobacco Use  . Smoking status: Former Smoker    Packs/day: 1.50    Years: 25.00    Pack years: 37.50    Types: Cigarettes    Last attempt to quit: 01/21/1974    Years since quitting: 43.6  . Smokeless tobacco: Never Used  Substance Use Topics  . Alcohol use: No  . Drug use: No    ALLERGIES:  has No Known Allergies.  MEDICATIONS:  Current Outpatient Medications  Medication Sig Dispense Refill  . abiraterone acetate (ZYTIGA) 250 MG tablet Take 4 tablets (1,000 mg total) by mouth daily. Take on an empty stomach 1 hour before or 2 hours after a meal 120 tablet 0  . allopurinol (ZYLOPRIM) 300 MG tablet Take 300 mg by mouth daily.     . Ascorbic Acid (VITAMIN C) 100 MG tablet Take 500 mg by mouth daily.    Marland Kitchen aspirin 81 MG tablet Take 81 mg by mouth daily. Reported on 01/03/2016    . atorvastatin (LIPITOR) 10 MG tablet Take 10 mg by mouth daily at 6 PM.     . carvedilol (COREG) 6.25 MG tablet Take 1 tablet (6.25 mg total) by mouth 2 (two) times daily with a meal. 60 tablet 2  . fentaNYL (DURAGESIC - DOSED MCG/HR) 50 MCG/HR Place 1 patch (50 mcg total) onto the skin every 3 (three) days. 10 patch 0  . ferrous sulfate 325 (65 FE) MG tablet Take 325 mg by mouth daily with breakfast.     . HYDROcodone-acetaminophen (NORCO/VICODIN) 5-325 MG tablet Take 1 tablet by mouth every 8 (eight) hours as needed. 90 tablet 0  . oxybutynin (DITROPAN) 5 MG tablet Take 10 mg by mouth 3 (three) times daily.     . pantoprazole (PROTONIX) 40 MG tablet Take 1 tablet (40 mg total) by mouth daily before breakfast. 30 tablet 2  . predniSONE (DELTASONE) 5 MG tablet Take 1 tablet (5 mg total) by mouth 2 (two) times daily. Take with food 60 tablet 3  . ranitidine (ZANTAC) 150 MG tablet Take 150 mg by mouth 2 (two) times daily.     . vitamin B-12 (CYANOCOBALAMIN) 1000 MCG tablet  Take 1,000 mcg by mouth daily.    . Vitamin D, Ergocalciferol, (DRISDOL) 50000 units CAPS capsule Take 50,000 Units by mouth every 7 (seven) days. tuesdays    . chlorthalidone (HYGROTON) 25 MG tablet Take 25 mg by mouth daily.      No current facility-administered medications for this visit.     PHYSICAL EXAMINATION: ECOG PERFORMANCE STATUS: 0 - Asymptomatic  BP (!) 155/87 (BP Location: Left Arm, Patient Position: Sitting)   Pulse 72   Temp 97.8 F (36.6 C) (Tympanic)   Resp 20   Ht 5\' 11"  (1.803 m)   Wt 192 lb 6.4 oz (87.3 kg)   BMI 26.83 kg/m   Filed Weights   08/30/17 0956  Weight: 192  lb 6.4 oz (87.3 kg)    GENERAL: Well-nourished well-developed; Alert, no distress and comfortable.  Accompanied by his wife. Patient is in a wheelchair because of left hip pain. EYES: no pallor or icterus OROPHARYNX: no thrush or ulceration; dentures. NECK: supple, no masses felt LYMPH: no palpable lymphadenopathy in the cervical, axillary or inguinal regions LUNGS: clear to auscultation and  No wheeze or crackles HEART/CVS: regular rate & rhythm and no murmurs; No lower extremity edema ABDOMEN: abdomen soft, non-tender and normal bowel sounds Musculoskeletal:no cyanosis of digits and no clubbing. PSYCH: alert & oriented x 3 with fluent speech NEURO: no focal motor/sensory deficits SKIN:  no rashes or significant lesions  LABORATORY DATA:  I have reviewed the data as listed    Component Value Date/Time   NA 137 08/30/2017 0940   K 3.4 (L) 08/30/2017 0940   CL 101 08/30/2017 0940   CO2 26 08/30/2017 0940   GLUCOSE 128 (H) 08/30/2017 0940   BUN 30 (H) 08/30/2017 0940   CREATININE 1.19 08/30/2017 0940   CALCIUM 9.8 08/30/2017 0940   PROT 7.0 08/30/2017 0940   ALBUMIN 3.7 08/30/2017 0940   AST 22 08/30/2017 0940   ALT 11 (L) 08/30/2017 0940   ALKPHOS 68 08/30/2017 0940   BILITOT 0.7 08/30/2017 0940   GFRNONAA 58 (L) 08/30/2017 0940   GFRAA >60 08/30/2017 0940    No results  found for: SPEP, UPEP  Lab Results  Component Value Date   WBC 9.5 08/30/2017   NEUTROABS 6.3 08/30/2017   HGB 13.8 08/30/2017   HCT 42.2 08/30/2017   MCV 88.1 08/30/2017   PLT 159 08/30/2017      Chemistry      Component Value Date/Time   NA 137 08/30/2017 0940   K 3.4 (L) 08/30/2017 0940   CL 101 08/30/2017 0940   CO2 26 08/30/2017 0940   BUN 30 (H) 08/30/2017 0940   CREATININE 1.19 08/30/2017 0940      Component Value Date/Time   CALCIUM 9.8 08/30/2017 0940   ALKPHOS 68 08/30/2017 0940   AST 22 08/30/2017 0940   ALT 11 (L) 08/30/2017 0940   BILITOT 0.7 08/30/2017 0940     Results for JAMEAL, RAZZANO (MRN [id number]) as of 03/11/2017 13:41  Ref. Range 11/21/2015 13:31 03/07/2016 14:55 06/28/2016 13:40 10/19/2016 13:35 02/25/2017 11:10  PSA Latest Ref Range: 0.00 - 4.00 ng/mL 0.12 0.06 0.02 0.10   Prostatic Specific Antigen Latest Ref Range: 0.00 - 4.00 ng/mL     0.39    IMPRESSION: Areas of bony metastatic disease in the lumbar spine, probable lower thoracic spine, and right ischium. There appears to be an increase in abnormal uptake in the lower thoracic spinous process regions at T9 and T10 as well as in portions of the L3 and L5 vertebrae compared the prior study. Radiographic correlation advised to confirm progression of bony metastatic disease in these areas. The abnormal radiotracer uptake at L4 and in the right ischium are stable. Old rib fractures show stable uptake bilaterally. Multiple areas of arthropathy noted. Fullness of the left renal collecting system has been documented previously and accounts for the decreased uptake in the left kidney compared to the right, a stable finding.  Electronically Signed   By: Lowella Grip III M.D.   On: 03/08/2017 13:01   IMPRESSION: 1. No acute intrathoracic pathology. No CT evidence of pulmonary embolus. 2. Mild emphysema. 3. **An incidental finding of potential clinical significance has been found. Right thyroid  hypodense nodule.  Further evaluation with ultrasound on a nonemergent basis recommended.** 4. Aortic Atherosclerosis (ICD10-I70.0) and Emphysema (ICD10-J43.9).  Electronically Signed   By: Anner Crete M.D.   On: 06/11/2017 22:10  IMPRESSION: 1. New radiotracer avid skeletal metastasis in the T9 vertebral body without associated CT findings. 2. Recurrence of prostate carcinoma bone metastasis at pathologic RIGHT rib fracture with intense radiotracer activity and new cortical erosion and expansion. 3. No evidence of metastatic adenopathy in the pelvis or abdomen. 4. No evidence local recurrence the prostate bed. 5. No radiotracer activity associated sclerotic lesions with L4 vertebral body. In   Electronically Signed   By: Suzy Bouchard M.D.   On: 08/08/2017 15:07   IMPRESSION: Stable sclerotic right inferior pubic ramus metastasis. No additional bony abnormality.  Electronically Signed   By: Rolm Baptise M.D.   On: 06/12/2017 19:31   Results for TRAVUS, OREN (MRN [id number]) as of 07/04/2017 11:55  Ref. Range 03/07/2016 14:55 06/28/2016 13:40 10/19/2016 13:35 02/25/2017 11:10 07/01/2017 11:34  PSA Latest Ref Range: 0.00 - 4.00 ng/mL 0.06 0.02 0.10    Prostatic Specific Antigen Latest Ref Range: 0.00 - 4.00 ng/mL    0.39 0.92    IMPRESSION: 1. 16 mm T8 metastasis without pathologic fracture. Expansile RIGHT posterior sixth rib metastasis. 2. Mild degenerative change of the spine, less than expected for age. No canal stenosis or neural foraminal narrowing.   Electronically Signed   By: Elon Alas M.D.   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 NOv 11th 2018-T spine MRI shows progressive lesion in his thoracic spine. Aux PET- right post rib uptake [not symptomatic]; left actebular uptake  [symptomatic]; [PSA not a good marker];   # continue zytiga+prednisone; tolerating without any side effects.   #Left hip pain/acetabular uptake-awaiting radiation starting next week.  Discussed the role of [name] however would continue Zytiga at this time.  # Bone mets-Xgeva q. Monthly.  Tolerating it without any side effects.  #Pain control-increase the dose of fentanyl to 50 mcg;and also continue current dose of Percocet 7.5 325 every 8 hours. New scripts given.   # Left ureteral stent/hydronephrosis- likely from pelvic adenopathy from his malignancy/ CKD- stage III- creat 1.1. Reviewed the labs.   # follow up in 4 weeks/labs/ X-geva.    Orders Placed This Encounter  Procedures  . CBC with Differential/Platelet    Standing Status:   Future    Standing Expiration Date:   08/30/2018  . Comprehensive metabolic panel    Standing Status:   Future    Standing Expiration Date:   08/30/2018  . PSA    Standing Status:   Future    Standing Expiration Date:   08/30/2018     Cammie Sickle, MD 08/30/2017 1:40 PM

## 2017-08-31 DIAGNOSIS — C7951 Secondary malignant neoplasm of bone: Secondary | ICD-10-CM | POA: Diagnosis not present

## 2017-08-31 DIAGNOSIS — Z510 Encounter for antineoplastic radiation therapy: Secondary | ICD-10-CM | POA: Diagnosis not present

## 2017-08-31 DIAGNOSIS — C61 Malignant neoplasm of prostate: Secondary | ICD-10-CM | POA: Diagnosis not present

## 2017-09-04 ENCOUNTER — Ambulatory Visit: Payer: PPO

## 2017-09-04 ENCOUNTER — Telehealth: Payer: Self-pay | Admitting: *Deleted

## 2017-09-04 ENCOUNTER — Ambulatory Visit
Admission: RE | Admit: 2017-09-04 | Discharge: 2017-09-04 | Disposition: A | Discharge status: Home / Self Care | Payer: PPO | Source: Ambulatory Visit | Attending: Radiation Oncology | Admitting: Radiation Oncology

## 2017-09-04 DIAGNOSIS — Z510 Encounter for antineoplastic radiation therapy: Secondary | ICD-10-CM | POA: Diagnosis not present

## 2017-09-04 DIAGNOSIS — C61 Malignant neoplasm of prostate: Secondary | ICD-10-CM | POA: Diagnosis not present

## 2017-09-04 DIAGNOSIS — C7951 Secondary malignant neoplasm of bone: Secondary | ICD-10-CM | POA: Diagnosis not present

## 2017-09-04 NOTE — Telephone Encounter (Signed)
Wife reports that Fentanyl and Norco needs PA

## 2017-09-04 NOTE — Telephone Encounter (Signed)
Brooke attempted PA via covermymeds and received a msg "patient's insurance could not be identified with name/dob/insurance data." Brooke, CMA reached out to pharmacy to determine if PA auth phone numbers could be fwd to our clinic.

## 2017-09-05 ENCOUNTER — Ambulatory Visit
Admission: RE | Admit: 2017-09-05 | Discharge: 2017-09-05 | Disposition: A | Discharge status: Home / Self Care | Payer: PPO | Source: Ambulatory Visit | Attending: Radiation Oncology | Admitting: Radiation Oncology

## 2017-09-05 ENCOUNTER — Encounter: Payer: Self-pay | Admitting: *Deleted

## 2017-09-05 DIAGNOSIS — Z510 Encounter for antineoplastic radiation therapy: Secondary | ICD-10-CM | POA: Diagnosis not present

## 2017-09-05 NOTE — Telephone Encounter (Signed)
Patient also came by the cancer center today for his radiation. He asked to speak to me regarding the update on his PA for narcotics. Pt states that he has plenty of patches still left. I informed pt that the PA was actively being worked on by our team. He gave verbal understanding. 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 I spoke with Dr. Rogue Bussing. MD agreed to write letter. Letter written and placed for patient's pick up at the front reception desk.

## 2017-09-05 NOTE — Telephone Encounter (Signed)
Fentanyl Patch PA submitted through CoverMyMeds - Key: [id number] Case ID: [id number]  Norco also submitted through CoverMyMeds - Key: [place] Case ID: [id number]

## 2017-09-05 NOTE — Telephone Encounter (Signed)
PA for fentanyl patch submitted through CoverMyMeds.  Key: [id number]  I am currently working on PA for Hydrocodone through CoverMyMeds also.

## 2017-09-06 ENCOUNTER — Telehealth: Payer: Self-pay | Admitting: Internal Medicine

## 2017-09-06 ENCOUNTER — Ambulatory Visit
Admission: RE | Admit: 2017-09-06 | Discharge: 2017-09-06 | Disposition: A | Discharge status: Home / Self Care | Payer: PPO | Source: Ambulatory Visit | Attending: Radiation Oncology | Admitting: Radiation Oncology

## 2017-09-06 DIAGNOSIS — Z510 Encounter for antineoplastic radiation therapy: Secondary | ICD-10-CM | POA: Diagnosis not present

## 2017-09-06 NOTE — Telephone Encounter (Signed)
Oral Oncology Patient Advocate Encounter   Sent letter to patient to have him sign for his [employer] part D. Mailed today and asked him to return ASAP. Then I will fax to company for him.   [place] Patient Advocate [phone] 09/06/2017 2:03 PM

## 2017-09-06 NOTE — Telephone Encounter (Signed)
Per [name], [place] is no longer needed for fentanyl & norco medications, and patient has picked up these prescriptions at his pharmacy. Thanks!

## 2017-09-09 ENCOUNTER — Ambulatory Visit
Admission: RE | Admit: 2017-09-09 | Discharge: 2017-09-09 | Disposition: A | Discharge status: Home / Self Care | Payer: PPO | Source: Ambulatory Visit | Attending: Radiation Oncology | Admitting: Radiation Oncology

## 2017-09-09 DIAGNOSIS — Z510 Encounter for antineoplastic radiation therapy: Secondary | ICD-10-CM | POA: Diagnosis not present

## 2017-09-10 ENCOUNTER — Ambulatory Visit
Admission: RE | Admit: 2017-09-10 | Discharge: 2017-09-10 | Disposition: A | Discharge status: Home / Self Care | Payer: PPO | Source: Ambulatory Visit | Attending: Radiation Oncology | Admitting: Radiation Oncology

## 2017-09-10 DIAGNOSIS — Z510 Encounter for antineoplastic radiation therapy: Secondary | ICD-10-CM | POA: Diagnosis not present

## 2017-09-11 ENCOUNTER — Telehealth: Payer: Self-pay | Admitting: Internal Medicine

## 2017-09-11 ENCOUNTER — Ambulatory Visit
Admission: RE | Admit: 2017-09-11 | Discharge: 2017-09-11 | Disposition: A | Discharge status: Home / Self Care | Payer: PPO | Source: Ambulatory Visit | Attending: Radiation Oncology | Admitting: Radiation Oncology

## 2017-09-11 ENCOUNTER — Other Ambulatory Visit: Payer: Self-pay | Admitting: *Deleted

## 2017-09-11 DIAGNOSIS — C7951 Secondary malignant neoplasm of bone: Secondary | ICD-10-CM | POA: Diagnosis not present

## 2017-09-11 DIAGNOSIS — Z510 Encounter for antineoplastic radiation therapy: Secondary | ICD-10-CM | POA: Diagnosis not present

## 2017-09-11 DIAGNOSIS — C61 Malignant neoplasm of prostate: Secondary | ICD-10-CM | POA: Diagnosis not present

## 2017-09-11 MED ORDER — HYDROCODONE-ACETAMINOPHEN 5-325 MG PO TABS: 1.0000 | ORAL_TABLET | Freq: Three times a day (TID) | ORAL | 0 refills | Status: DC | PRN

## 2017-09-11 NOTE — Telephone Encounter (Signed)
Oral Oncology Patient Advocate Encounter   Called [facility] about patients Zytiga and it will be shipped out today and will receive tomorrow.  Left patient a message on home phone.  Please call if he does not receive it.   [place] Patient Advocate [phone] 09/11/2017 9:27 AM

## 2017-09-11 NOTE — Telephone Encounter (Signed)
Patient called to request a refill of his Norco reporting that of the 90 prescribed on 1/11, he was only given 21 due to insurance. He requests we contact his insurance company for an override

## 2017-09-11 NOTE — Telephone Encounter (Signed)
Insurance would only cover a 1 week supply for 1 st fill this year and now will only fill 3 week supply # 69 tablets for the rest of moth, After this they will cover a months supply. Pharmacy needs a prescription for 69 tabs

## 2017-09-12 ENCOUNTER — Ambulatory Visit
Admission: RE | Admit: 2017-09-12 | Discharge: 2017-09-12 | Disposition: A | Discharge status: Home / Self Care | Payer: PPO | Source: Ambulatory Visit | Attending: Radiation Oncology | Admitting: Radiation Oncology

## 2017-09-12 DIAGNOSIS — Z510 Encounter for antineoplastic radiation therapy: Secondary | ICD-10-CM | POA: Diagnosis not present

## 2017-09-13 ENCOUNTER — Telehealth: Payer: Self-pay | Admitting: *Deleted

## 2017-09-13 ENCOUNTER — Ambulatory Visit
Admission: RE | Admit: 2017-09-13 | Discharge: 2017-09-13 | Disposition: A | Discharge status: Home / Self Care | Payer: PPO | Source: Ambulatory Visit | Attending: Radiation Oncology | Admitting: Radiation Oncology

## 2017-09-13 DIAGNOSIS — Z510 Encounter for antineoplastic radiation therapy: Secondary | ICD-10-CM | POA: Diagnosis not present

## 2017-09-13 NOTE — Telephone Encounter (Signed)
Patient came by cancer center to speak to [facility] [place]. Pt reports that his pain medication was not filled yesterday. States that pharmacy needed Dr. Rogue Bussing to call their office. Pharmacy unable to run script under Ander Purpura, NP's name. I personally reached out to the pharmacy. Pharmacist was able to get the script to run through with the insurance this morning. Apparently, the pharmacy was having computer issues. I informed pt that his script was ready to be picked up. He thanked me for helping him.

## 2017-09-16 ENCOUNTER — Inpatient Hospital Stay: Payer: PPO

## 2017-09-16 ENCOUNTER — Ambulatory Visit
Admission: RE | Admit: 2017-09-16 | Discharge: 2017-09-16 | Disposition: A | Discharge status: Home / Self Care | Payer: PPO | Source: Ambulatory Visit | Attending: Radiation Oncology | Admitting: Radiation Oncology

## 2017-09-16 DIAGNOSIS — Z510 Encounter for antineoplastic radiation therapy: Secondary | ICD-10-CM | POA: Diagnosis not present

## 2017-09-17 ENCOUNTER — Ambulatory Visit
Admission: RE | Admit: 2017-09-17 | Discharge: 2017-09-17 | Disposition: A | Discharge status: Home / Self Care | Payer: PPO | Source: Ambulatory Visit | Attending: Radiation Oncology | Admitting: Radiation Oncology

## 2017-09-17 DIAGNOSIS — Z510 Encounter for antineoplastic radiation therapy: Secondary | ICD-10-CM | POA: Diagnosis not present

## 2017-09-17 DIAGNOSIS — N189 Chronic kidney disease, unspecified: Secondary | ICD-10-CM | POA: Diagnosis not present

## 2017-09-18 ENCOUNTER — Ambulatory Visit
Admission: RE | Admit: 2017-09-18 | Discharge: 2017-09-18 | Disposition: A | Discharge status: Home / Self Care | Payer: PPO | Source: Ambulatory Visit | Attending: Radiation Oncology | Admitting: Radiation Oncology

## 2017-09-18 DIAGNOSIS — C7951 Secondary malignant neoplasm of bone: Secondary | ICD-10-CM | POA: Diagnosis not present

## 2017-09-18 DIAGNOSIS — C61 Malignant neoplasm of prostate: Secondary | ICD-10-CM | POA: Diagnosis not present

## 2017-09-18 DIAGNOSIS — Z510 Encounter for antineoplastic radiation therapy: Secondary | ICD-10-CM | POA: Diagnosis not present

## 2017-09-27 ENCOUNTER — Inpatient Hospital Stay: Payer: PPO

## 2017-09-27 ENCOUNTER — Inpatient Hospital Stay (HOSPITAL_BASED_OUTPATIENT_CLINIC_OR_DEPARTMENT_OTHER): Payer: PPO | Admitting: Internal Medicine

## 2017-09-27 ENCOUNTER — Inpatient Hospital Stay: Payer: PPO | Attending: Internal Medicine

## 2017-09-27 VITALS — BP 112/68 | HR 76 | Resp 16 | Wt 187.0 lb

## 2017-09-27 DIAGNOSIS — I700 Atherosclerosis of aorta: Secondary | ICD-10-CM | POA: Insufficient documentation

## 2017-09-27 DIAGNOSIS — E7800 Pure hypercholesterolemia, unspecified: Secondary | ICD-10-CM | POA: Diagnosis not present

## 2017-09-27 DIAGNOSIS — M129 Arthropathy, unspecified: Secondary | ICD-10-CM

## 2017-09-27 DIAGNOSIS — Z9079 Acquired absence of other genital organ(s): Secondary | ICD-10-CM

## 2017-09-27 DIAGNOSIS — N183 Chronic kidney disease, stage 3 (moderate): Secondary | ICD-10-CM

## 2017-09-27 DIAGNOSIS — C7951 Secondary malignant neoplasm of bone: Secondary | ICD-10-CM

## 2017-09-27 DIAGNOSIS — Z8781 Personal history of (healed) traumatic fracture: Secondary | ICD-10-CM | POA: Diagnosis not present

## 2017-09-27 DIAGNOSIS — Z87891 Personal history of nicotine dependence: Secondary | ICD-10-CM | POA: Diagnosis not present

## 2017-09-27 DIAGNOSIS — R011 Cardiac murmur, unspecified: Secondary | ICD-10-CM | POA: Diagnosis not present

## 2017-09-27 DIAGNOSIS — I4892 Unspecified atrial flutter: Secondary | ICD-10-CM | POA: Insufficient documentation

## 2017-09-27 DIAGNOSIS — I129 Hypertensive chronic kidney disease with stage 1 through stage 4 chronic kidney disease, or unspecified chronic kidney disease: Secondary | ICD-10-CM | POA: Insufficient documentation

## 2017-09-27 DIAGNOSIS — K219 Gastro-esophageal reflux disease without esophagitis: Secondary | ICD-10-CM

## 2017-09-27 DIAGNOSIS — I350 Nonrheumatic aortic (valve) stenosis: Secondary | ICD-10-CM

## 2017-09-27 DIAGNOSIS — R531 Weakness: Secondary | ICD-10-CM | POA: Diagnosis not present

## 2017-09-27 DIAGNOSIS — E1122 Type 2 diabetes mellitus with diabetic chronic kidney disease: Secondary | ICD-10-CM | POA: Diagnosis not present

## 2017-09-27 DIAGNOSIS — Z8546 Personal history of malignant neoplasm of prostate: Secondary | ICD-10-CM | POA: Insufficient documentation

## 2017-09-27 DIAGNOSIS — Z7982 Long term (current) use of aspirin: Secondary | ICD-10-CM | POA: Diagnosis not present

## 2017-09-27 DIAGNOSIS — Z923 Personal history of irradiation: Secondary | ICD-10-CM

## 2017-09-27 DIAGNOSIS — G893 Neoplasm related pain (acute) (chronic): Secondary | ICD-10-CM | POA: Insufficient documentation

## 2017-09-27 DIAGNOSIS — E876 Hypokalemia: Secondary | ICD-10-CM

## 2017-09-27 DIAGNOSIS — J439 Emphysema, unspecified: Secondary | ICD-10-CM

## 2017-09-27 DIAGNOSIS — C61 Malignant neoplasm of prostate: Secondary | ICD-10-CM

## 2017-09-27 DIAGNOSIS — E041 Nontoxic single thyroid nodule: Secondary | ICD-10-CM | POA: Diagnosis not present

## 2017-09-27 DIAGNOSIS — N1330 Unspecified hydronephrosis: Secondary | ICD-10-CM | POA: Insufficient documentation

## 2017-09-27 DIAGNOSIS — Z79899 Other long term (current) drug therapy: Secondary | ICD-10-CM | POA: Diagnosis not present

## 2017-09-27 LAB — COMPREHENSIVE METABOLIC PANEL
ALBUMIN: 3.5 g/dL (ref 3.5–5.0)
ALK PHOS: 63 U/L (ref 38–126)
ALT: 12 U/L — ABNORMAL LOW (ref 17–63)
ANION GAP: 11 (ref 5–15)
AST: 26 U/L (ref 15–41)
BUN: 31 mg/dL — ABNORMAL HIGH (ref 6–20)
CALCIUM: 9.7 mg/dL (ref 8.9–10.3)
CHLORIDE: 101 mmol/L (ref 101–111)
CO2: 25 mmol/L (ref 22–32)
Creatinine, Ser: 1.08 mg/dL (ref 0.61–1.24)
GFR calc Af Amer: >60 mL/min (ref >60)
GFR calc non Af Amer: >60 mL/min (ref >60)
GLUCOSE: 162 mg/dL — AB (ref 65–99)
Potassium: 3.3 mmol/L — ABNORMAL LOW (ref 3.5–5.1)
Sodium: 137 mmol/L (ref 135–145)
Total Bilirubin: 0.6 mg/dL (ref 0.3–1.2)
Total Protein: 6.8 g/dL (ref 6.5–8.1)

## 2017-09-27 LAB — CBC WITH DIFFERENTIAL/PLATELET
Basophils Absolute: 0.1 10*3/uL (ref 0–0.1)
Basophils Relative: 1 %
Eosinophils Absolute: 0.3 10*3/uL (ref 0–0.7)
Eosinophils Relative: 3 %
HCT: 41.8 % (ref 40.0–52.0)
HEMOGLOBIN: 13.8 g/dL (ref 13.0–18.0)
LYMPHS ABS: 1.5 10*3/uL (ref 1.0–3.6)
Lymphocytes Relative: 19 %
MCH: 29.4 pg (ref 26.0–34.0)
MCHC: 33 g/dL (ref 32.0–36.0)
MCV: 89.3 fL (ref 80.0–100.0)
MONOS PCT: 9 %
Monocytes Absolute: 0.7 10*3/uL (ref 0.2–1.0)
NEUTROS ABS: 5.3 10*3/uL (ref 1.4–6.5)
NEUTROS PCT: 68 %
Platelets: 135 10*3/uL — ABNORMAL LOW (ref 150–440)
RBC: 4.68 MIL/uL (ref 4.40–5.90)
RDW: 16.6 % — ABNORMAL HIGH (ref 11.5–14.5)
WBC: 7.8 10*3/uL (ref 3.8–10.6)

## 2017-09-27 LAB — PSA: Prostatic Specific Antigen: 3.13 ng/mL (ref 0.00–4.00)

## 2017-09-27 MED ORDER — DENOSUMAB 120 MG/1.7ML ~~LOC~~ SOLN
120.0000 mg | Freq: Once | SUBCUTANEOUS | Status: AC
  Administered 2017-09-27: 120 mg via SUBCUTANEOUS
  Filled 2017-09-27: qty 1.7

## 2017-09-27 NOTE — Progress Notes (Signed)
[place] OFFICE PROGRESS NOTE  Patient Care Team: Tamsen Roers, MD as PCP - General (Family Medicine)   SUMMARY OF ONCOLOGIC HISTORY:  Oncology History   # AUG 2016- METASTATIC PROSTATE CA/ STAGE IV; Castrate sensitive [PSA- 48] ; mets- lumbar spine [s/p pal RT to Aug 2016; Dr.Crystal] /Pelvic LN; 1994- Prostate CA s/p Surgery [facility]]; Lupron q4 M; MARCH 2017- Bone scan- L4/Right pubic rami uptake; PSA- 0.1/testosterone-castrate [<3]  # Bone lesions on denosumab;DEC 2016- Recm q 43M  # NOV 2018- CASTRATE RESISTANT; declined chemo; 08/06/2017- start Zytiga+prednisone  # Left hydronephrosis s/p stenting [Dr.Budzyn]     Prostate cancer metastatic to bone [facility])    INTERVAL HISTORY:  A very pleasant 77 year old male patient with above history of prostate cancer stage IV hormone sensitive currently on Lupron every 4 months-is here for follow-up.  Patient is currently status post radiation approximately 10 days ago.  Left hip pain is improved.  Pain is not completely resolved.  He continues to be in the fentanyl patch and also hydrocodone 2-3-day as needed.  Denies any nausea vomiting.  Denies any swelling in the legs.  Denies any diarrhea or constipation.  REVIEW OF SYSTEMS:  A complete 10 point review of system is done which is negative except mentioned above/history of present illness.   PAST MEDICAL HISTORY :  Past Medical History:  Diagnosis Date  . Aortic stenosis, moderate 01/30/2014  . Arthritis   . Atrial flutter ([place]) 02/02/2014  . Diabetes mellitus without complication (HCC)    diet control  . GERD (gastroesophageal reflux disease)   . Heart murmur   . Heart murmur   . HTN (hypertension) 01/30/2014  . Hypercholesteremia   . Hypertension   . Prostate cancer ([place])    metastatic  . Weakness of left leg     PAST SURGICAL HISTORY :   Past Surgical History:  Procedure Laterality Date  . CATARACT EXTRACTION W/ INTRAOCULAR LENS IMPLANT Bilateral    . COLON RESECTION    . CYSTOSCOPY W/ RETROGRADES Left 06/08/2015   Procedure: CYSTOSCOPY WITH RETROGRADE PYELOGRAM;  Surgeon: Nickie Retort, MD;  Location: [facility];  Service: Urology;  Laterality: Left;  . CYSTOSCOPY W/ URETERAL STENT PLACEMENT Left 10/05/2015   Procedure: CYSTOSCOPY WITH STENT REPLACEMENT;  Surgeon: Nickie Retort, MD;  Location: [facility];  Service: Urology;  Laterality: Left;  . CYSTOSCOPY W/ URETERAL STENT PLACEMENT Left 01/04/2016   Procedure: CYSTOSCOPY WITH STENT REPLACEMENT;  Surgeon: Nickie Retort, MD;  Location: [facility];  Service: Urology;  Laterality: Left;  . CYSTOSCOPY W/ URETERAL STENT PLACEMENT Left 04/11/2016   Procedure: CYSTOSCOPY WITH STENT REPLACEMENT;  Surgeon: Nickie Retort, MD;  Location: [facility];  Service: Urology;  Laterality: Left;  . CYSTOSCOPY W/ URETERAL STENT PLACEMENT Left 07/18/2016   Procedure: CYSTOSCOPY WITH STENT REPLACEMENT;  Surgeon: Nickie Retort, MD;  Location: [facility];  Service: Urology;  Laterality: Left;  . CYSTOSCOPY W/ URETERAL STENT PLACEMENT Left 11/02/2016   Procedure: CYSTOSCOPY WITH STENT REPLACEMENT;  Surgeon: Nickie Retort, MD;  Location: [facility];  Service: Urology;  Laterality: Left;  . CYSTOSCOPY W/ URETERAL STENT PLACEMENT Left 02/08/2017   Procedure: CYSTOSCOPY WITH STENT REPLACEMENT;  Surgeon: Nickie Retort, MD;  Location: [facility];  Service: Urology;  Laterality: Left;  . CYSTOSCOPY W/ URETERAL STENT PLACEMENT Left 05/10/2017   Procedure: CYSTOSCOPY WITH STENT REPLACEMENT;  Surgeon: Nickie Retort, MD;  Location: [facility];  Service: Urology;  Laterality: Left;  .  CYSTOSCOPY WITH STENT PLACEMENT Left 08/16/2017   Procedure: CYSTOSCOPY WITH STENT EXCHANGE;  Surgeon: Abbie Sons, MD;  Location: [facility];  Service: Urology;  Laterality: Left;  . EYE SURGERY Bilateral Sept and Oct.2012   cataract  . HERNIA REPAIR    . PROSTATECTOMY  1994    FAMILY HISTORY :   Family History   Problem Relation Age of Onset  . Aneurysm Father   . Cancer Mother        breast    SOCIAL HISTORY:   Social History   Tobacco Use  . Smoking status: Former Smoker    Packs/day: 1.50    Years: 25.00    Pack years: 37.50    Types: Cigarettes    Last attempt to quit: 01/21/1974    Years since quitting: 43.7  . Smokeless tobacco: Never Used  Substance Use Topics  . Alcohol use: No  . Drug use: No    ALLERGIES:  has No Known Allergies.  MEDICATIONS:  Current Outpatient Medications  Medication Sig Dispense Refill  . abiraterone acetate (ZYTIGA) 250 MG tablet Take 4 tablets (1,000 mg total) by mouth daily. Take on an empty stomach 1 hour before or 2 hours after a meal 120 tablet 0  . allopurinol (ZYLOPRIM) 300 MG tablet Take 300 mg by mouth daily.     . Ascorbic Acid (VITAMIN C) 100 MG tablet Take 500 mg by mouth daily.    Marland Kitchen aspirin 81 MG tablet Take 81 mg by mouth daily. Reported on 01/03/2016    . atorvastatin (LIPITOR) 10 MG tablet Take 10 mg by mouth daily at 6 PM.     . carvedilol (COREG) 6.25 MG tablet Take 1 tablet (6.25 mg total) by mouth 2 (two) times daily with a meal. 60 tablet 2  . chlorthalidone (HYGROTON) 25 MG tablet Take 25 mg by mouth daily.     . fentaNYL (DURAGESIC - DOSED MCG/HR) 50 MCG/HR Place 1 patch (50 mcg total) onto the skin every 3 (three) days. 10 patch 0  . ferrous sulfate 325 (65 FE) MG tablet Take 325 mg by mouth daily with breakfast.     . HYDROcodone-acetaminophen (NORCO/VICODIN) 5-325 MG tablet Take 1 tablet by mouth every 8 (eight) hours as needed. 69 tablet 0  . oxybutynin (DITROPAN) 5 MG tablet Take 10 mg by mouth 3 (three) times daily.     . pantoprazole (PROTONIX) 40 MG tablet Take 1 tablet (40 mg total) by mouth daily before breakfast. 30 tablet 2  . predniSONE (DELTASONE) 5 MG tablet Take 1 tablet (5 mg total) by mouth 2 (two) times daily. Take with food 60 tablet 3  . ranitidine (ZANTAC) 150 MG tablet Take 150 mg by mouth 2 (two) times  daily.     . vitamin B-12 (CYANOCOBALAMIN) 1000 MCG tablet Take 1,000 mcg by mouth daily.    . Vitamin D, Ergocalciferol, (DRISDOL) 50000 units CAPS capsule Take 50,000 Units by mouth every 7 (seven) days. tuesdays     No current facility-administered medications for this visit.     PHYSICAL EXAMINATION: ECOG PERFORMANCE STATUS: 0 - Asymptomatic  BP 112/68 (BP Location: Left Arm, Patient Position: Sitting)   Pulse 76   Resp 16   Wt 187 lb (84.8 kg)   BMI 26.08 kg/m   Filed Weights   09/27/17 1055  Weight: 187 lb (84.8 kg)    GENERAL: Well-nourished well-developed; Alert, no distress and comfortable.  Accompanied by his wife. Patient is in a wheelchair because  of left hip pain. EYES: no pallor or icterus OROPHARYNX: no thrush or ulceration; dentures. NECK: supple, no masses felt LYMPH: no palpable lymphadenopathy in the cervical, axillary or inguinal regions LUNGS: clear to auscultation and  No wheeze or crackles HEART/CVS: regular rate & rhythm and no murmurs; No lower extremity edema ABDOMEN: abdomen soft, non-tender and normal bowel sounds Musculoskeletal:no cyanosis of digits and no clubbing. PSYCH: alert & oriented x 3 with fluent speech NEURO: no focal motor/sensory deficits SKIN:  no rashes or significant lesions  LABORATORY DATA:  I have reviewed the data as listed    Component Value Date/Time   NA 137 09/27/2017 1025   K 3.3 (L) 09/27/2017 1025   CL 101 09/27/2017 1025   CO2 25 09/27/2017 1025   GLUCOSE 162 (H) 09/27/2017 1025   BUN 31 (H) 09/27/2017 1025   CREATININE 1.08 09/27/2017 1025   CALCIUM 9.7 09/27/2017 1025   PROT 6.8 09/27/2017 1025   ALBUMIN 3.5 09/27/2017 1025   AST 26 09/27/2017 1025   ALT 12 (L) 09/27/2017 1025   ALKPHOS 63 09/27/2017 1025   BILITOT 0.6 09/27/2017 1025   GFRNONAA >60 09/27/2017 1025   GFRAA >60 09/27/2017 1025    No results found for: SPEP, UPEP  Lab Results  Component Value Date   WBC 7.8 09/27/2017   NEUTROABS  5.3 09/27/2017   HGB 13.8 09/27/2017   HCT 41.8 09/27/2017   MCV 89.3 09/27/2017   PLT 135 (L) 09/27/2017      Chemistry      Component Value Date/Time   NA 137 09/27/2017 1025   K 3.3 (L) 09/27/2017 1025   CL 101 09/27/2017 1025   CO2 25 09/27/2017 1025   BUN 31 (H) 09/27/2017 1025   CREATININE 1.08 09/27/2017 1025      Component Value Date/Time   CALCIUM 9.7 09/27/2017 1025   ALKPHOS 63 09/27/2017 1025   AST 26 09/27/2017 1025   ALT 12 (L) 09/27/2017 1025   BILITOT 0.6 09/27/2017 1025     Results for DRU, PRIMEAU (MRN [id number]) as of 03/11/2017 13:41  Ref. Range 11/21/2015 13:31 03/07/2016 14:55 06/28/2016 13:40 10/19/2016 13:35 02/25/2017 11:10  PSA Latest Ref Range: 0.00 - 4.00 ng/mL 0.12 0.06 0.02 0.10   Prostatic Specific Antigen Latest Ref Range: 0.00 - 4.00 ng/mL     0.39    IMPRESSION: Areas of bony metastatic disease in the lumbar spine, probable lower thoracic spine, and right ischium. There appears to be an increase in abnormal uptake in the lower thoracic spinous process regions at T9 and T10 as well as in portions of the L3 and L5 vertebrae compared the prior study. Radiographic correlation advised to confirm progression of bony metastatic disease in these areas. The abnormal radiotracer uptake at L4 and in the right ischium are stable. Old rib fractures show stable uptake bilaterally. Multiple areas of arthropathy noted. Fullness of the left renal collecting system has been documented previously and accounts for the decreased uptake in the left kidney compared to the right, a stable finding.  Electronically Signed   By: Lowella Grip III M.D.   On: 03/08/2017 13:01   IMPRESSION: 1. No acute intrathoracic pathology. No CT evidence of pulmonary embolus. 2. Mild emphysema. 3. **An incidental finding of potential clinical significance has been found. Right thyroid hypodense nodule. Further evaluation with ultrasound on a nonemergent basis  recommended.** 4. Aortic Atherosclerosis (ICD10-I70.0) and Emphysema (ICD10-J43.9).  Electronically Signed   By: Laren Everts.D.  On: 06/11/2017 22:10  IMPRESSION: 1. New radiotracer avid skeletal metastasis in the T9 vertebral body without associated CT findings. 2. Recurrence of prostate carcinoma bone metastasis at pathologic RIGHT rib fracture with intense radiotracer activity and new cortical erosion and expansion. 3. No evidence of metastatic adenopathy in the pelvis or abdomen. 4. No evidence local recurrence the prostate bed. 5. No radiotracer activity associated sclerotic lesions with L4 vertebral body. In   Electronically Signed   By: Suzy Bouchard M.D.   On: 08/08/2017 15:07   IMPRESSION: Stable sclerotic right inferior pubic ramus metastasis. No additional bony abnormality.  Electronically Signed   By: Rolm Baptise M.D.   On: 06/12/2017 19:31    IMPRESSION: 1. 16 mm T8 metastasis without pathologic fracture. Expansile RIGHT posterior sixth rib metastasis. 2. Mild degenerative change of the spine, less than expected for age. No canal stenosis or neural foraminal narrowing.   Electronically Signed   By: Elon Alas M.D.   On: 06/27/2017 16:58  Results for VONN, SLIGER (MRN [id number]) as of 09/28/2017 07:54  Ref. 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 NOv 11th 2018-T spine MRI shows progressive lesion in his thoracic spine. Aux PET- right post rib uptake [not symptomatic]; left actebular uptake [symptomatic]; [PSA not a good marker];   # continue zytiga+prednisone; tolerating without  any side effects-except for mild hypokalemia.  #Left hip pain/acetabular uptake-s/p RT [finished 30th jan 2019]. Improvement in pain.   # Bone mets-Xgeva q. Monthly.  Tolerating it without any side effects.  #Pain control-currently improved on fentanyl  50 mcg;and also  On Percocet 7.5 325 every 8 hours.  # hypokal- K- 3.3 sec to zytiga-   # Left ureteral stent/hydronephrosis- likely from pelvic adenopathy from his malignancy/ CKD- stage III- creat 1.1. Reviewed the labs.   # follow up in 4 weeks/labs/ X-geva; lupron.    Orders Placed This Encounter  Procedures  . CBC with Differential/Platelet    Standing Status:   Future    Standing Expiration Date:   09/27/2018  . Comprehensive metabolic panel    Standing Status:   Future    Standing Expiration Date:   09/27/2018  . PSA    Standing Status:   Future    Standing Expiration Date:   09/27/2018     Cammie Sickle, MD 09/28/2017 7:57 AM

## 2017-09-27 NOTE — Assessment & Plan Note (Addendum)
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 NOv 11th 2018-T spine MRI shows progressive lesion in his thoracic spine. Aux PET- right post rib uptake [not symptomatic]; left actebular uptake [symptomatic]; [PSA not a good marker];   # continue zytiga+prednisone; tolerating without any side effects-except for mild hypokalemia.  #Left hip pain/acetabular uptake-s/p RT [finished 30th jan 2019]. Improvement in pain.   # Bone mets-Xgeva q. Monthly.  Tolerating it without any side effects.  #Pain control-currently improved on fentanyl  50 mcg;and also  On Percocet 7.5 325 every 8 hours.  # hypokal- K- 3.3 sec to zytiga-   # Left ureteral stent/hydronephrosis- likely from pelvic adenopathy from his malignancy/ CKD- stage III- creat 1.1. Reviewed the labs.   # follow up in 4 weeks/labs/ X-geva; lupron.

## 2017-09-28 LAB — CEA: CEA1: 3.4 ng/mL (ref 0.0–4.7)

## 2017-10-01 DIAGNOSIS — N189 Chronic kidney disease, unspecified: Secondary | ICD-10-CM | POA: Diagnosis not present

## 2017-10-01 DIAGNOSIS — E119 Type 2 diabetes mellitus without complications: Secondary | ICD-10-CM | POA: Diagnosis not present

## 2017-10-01 DIAGNOSIS — C61 Malignant neoplasm of prostate: Secondary | ICD-10-CM | POA: Diagnosis not present

## 2017-10-16 ENCOUNTER — Other Ambulatory Visit: Payer: Self-pay | Admitting: *Deleted

## 2017-10-16 MED ORDER — HYDROCODONE-ACETAMINOPHEN 5-325 MG PO TABS: 1.0000 | ORAL_TABLET | Freq: Three times a day (TID) | ORAL | 0 refills | Status: DC | PRN

## 2017-10-18 ENCOUNTER — Ambulatory Visit
Admission: RE | Admit: 2017-10-18 | Discharge: 2017-10-18 | Disposition: A | Discharge status: Home / Self Care | Payer: Medicare PPO | Source: Ambulatory Visit | Attending: Radiation Oncology | Admitting: Radiation Oncology

## 2017-10-18 ENCOUNTER — Encounter: Payer: Self-pay | Admitting: Radiation Oncology

## 2017-10-18 ENCOUNTER — Other Ambulatory Visit: Payer: Self-pay

## 2017-10-18 VITALS — BP 156/79 | HR 75 | Temp 97.2°F | Resp 20 | Wt 190.7 lb

## 2017-10-18 DIAGNOSIS — C7951 Secondary malignant neoplasm of bone: Secondary | ICD-10-CM | POA: Insufficient documentation

## 2017-10-18 DIAGNOSIS — C61 Malignant neoplasm of prostate: Secondary | ICD-10-CM | POA: Insufficient documentation

## 2017-10-18 DIAGNOSIS — Z923 Personal history of irradiation: Secondary | ICD-10-CM | POA: Diagnosis not present

## 2017-10-18 NOTE — Progress Notes (Signed)
Radiation Oncology Follow up Note  Name: [name]   Date:   10/18/2017 MRN:  [id number] DOB: Jul 19, 1941    [name] presents to the clinic today for one-month follow-up status post palliative radiation therapy to his left hip for stage IV prostate cancer.  REFERRING PROVIDER: Tamsen Roers, MD  HPI: Patient is a 77 year old male now out 1 month having completed palliative radiation therapy to his left hip for stage IV metastatic prostate cancer. He also been treated to the previous he treated to his lumbar spine back in 2018.Marland Kitchen He is currently on Zytiga and prednisone he is currently on fentanyl as well as Percocet.  COMPLICATIONS OF TREATMENT: none  FOLLOW UP COMPLIANCE: keeps appointments   PHYSICAL EXAM:  BP (!) 156/79   Pulse 75   Temp (!) 97.2 F (36.2 C)   Resp 20   Wt 190 lb 11.2 oz (86.5 kg)   BMI 26.60 kg/m  Range of motion of left lower extremity does not elicit pain. Deep palpation of his spine does not elicit pain. Motor sensory and DTR levels are equal and symmetric in the upper lower extremities. Well-developed well-nourished patient in NAD. HEENT reveals PERLA, EOMI, discs not visualized.  Oral cavity is clear. No oral mucosal lesions are identified. Neck is clear without evidence of cervical or supraclavicular adenopathy. Lungs are clear to A&P. Cardiac examination is essentially unremarkable with regular rate and rhythm without murmur rub or thrill. Abdomen is benign with no organomegaly or masses noted. Motor sensory and DTR levels are equal and symmetric in the upper and lower extremities. Cranial nerves II through XII are grossly intact. Proprioception is intact. No peripheral adenopathy or edema is identified. No motor or sensory levels are noted. Crude visual fields are within normal range.  RADIOLOGY RESULTS: No current films for review  PLAN: At this time I'll turn follow-up care over to medical oncology. he continues on Zytiga and prednisone. I would  be happy to reevaluate weight this patient at any time. He may be a candidate for Xofigo in the future. Patient is to call with any concerns.  I would like to take this opportunity to thank you for allowing me to participate in the care of your patient.Noreene Filbert, MD

## 2017-10-21 ENCOUNTER — Other Ambulatory Visit: Payer: Self-pay | Admitting: *Deleted

## 2017-10-22 MED ORDER — FENTANYL 50 MCG/HR TD PT72: 50.0000 ug | MEDICATED_PATCH | TRANSDERMAL | 0 refills | Status: DC

## 2017-10-24 ENCOUNTER — Other Ambulatory Visit: Payer: Self-pay | Admitting: Radiology

## 2017-10-24 DIAGNOSIS — N135 Crossing vessel and stricture of ureter without hydronephrosis: Secondary | ICD-10-CM

## 2017-10-25 ENCOUNTER — Telehealth: Payer: Self-pay | Admitting: Internal Medicine

## 2017-10-25 NOTE — Telephone Encounter (Signed)
Oral Oncology Patient Advocate Encounter  Patient called to let us know his insurance had changed to [facility] He will bring card to appointment for [place] to call.   [place] Patient Advocate [phone] 10/25/2017 3:06 PM

## 2017-10-28 ENCOUNTER — Other Ambulatory Visit: Payer: Self-pay

## 2017-10-28 ENCOUNTER — Inpatient Hospital Stay (HOSPITAL_BASED_OUTPATIENT_CLINIC_OR_DEPARTMENT_OTHER): Payer: Medicare PPO | Admitting: Internal Medicine

## 2017-10-28 ENCOUNTER — Inpatient Hospital Stay: Payer: Medicare PPO | Attending: Internal Medicine

## 2017-10-28 ENCOUNTER — Encounter: Payer: Self-pay | Admitting: Internal Medicine

## 2017-10-28 ENCOUNTER — Inpatient Hospital Stay: Payer: Medicare PPO

## 2017-10-28 VITALS — BP 164/95 | HR 64 | Temp 98.0°F | Resp 16 | Ht 71.0 in | Wt 182.4 lb

## 2017-10-28 DIAGNOSIS — C7951 Secondary malignant neoplasm of bone: Secondary | ICD-10-CM | POA: Insufficient documentation

## 2017-10-28 DIAGNOSIS — C61 Malignant neoplasm of prostate: Secondary | ICD-10-CM

## 2017-10-28 DIAGNOSIS — E041 Nontoxic single thyroid nodule: Secondary | ICD-10-CM | POA: Diagnosis not present

## 2017-10-28 DIAGNOSIS — N1330 Unspecified hydronephrosis: Secondary | ICD-10-CM | POA: Diagnosis not present

## 2017-10-28 DIAGNOSIS — Z192 Hormone resistant malignancy status: Secondary | ICD-10-CM | POA: Diagnosis not present

## 2017-10-28 DIAGNOSIS — I10 Essential (primary) hypertension: Secondary | ICD-10-CM | POA: Insufficient documentation

## 2017-10-28 DIAGNOSIS — Z9079 Acquired absence of other genital organ(s): Secondary | ICD-10-CM | POA: Insufficient documentation

## 2017-10-28 DIAGNOSIS — G893 Neoplasm related pain (acute) (chronic): Secondary | ICD-10-CM | POA: Diagnosis not present

## 2017-10-28 DIAGNOSIS — I4892 Unspecified atrial flutter: Secondary | ICD-10-CM | POA: Insufficient documentation

## 2017-10-28 DIAGNOSIS — E119 Type 2 diabetes mellitus without complications: Secondary | ICD-10-CM

## 2017-10-28 DIAGNOSIS — Z79818 Long term (current) use of other agents affecting estrogen receptors and estrogen levels: Secondary | ICD-10-CM | POA: Diagnosis not present

## 2017-10-28 DIAGNOSIS — R011 Cardiac murmur, unspecified: Secondary | ICD-10-CM | POA: Diagnosis not present

## 2017-10-28 DIAGNOSIS — K219 Gastro-esophageal reflux disease without esophagitis: Secondary | ICD-10-CM | POA: Diagnosis not present

## 2017-10-28 DIAGNOSIS — Z803 Family history of malignant neoplasm of breast: Secondary | ICD-10-CM | POA: Diagnosis not present

## 2017-10-28 DIAGNOSIS — Z87891 Personal history of nicotine dependence: Secondary | ICD-10-CM | POA: Diagnosis not present

## 2017-10-28 DIAGNOSIS — I350 Nonrheumatic aortic (valve) stenosis: Secondary | ICD-10-CM | POA: Insufficient documentation

## 2017-10-28 DIAGNOSIS — E7800 Pure hypercholesterolemia, unspecified: Secondary | ICD-10-CM | POA: Diagnosis not present

## 2017-10-28 DIAGNOSIS — Z7982 Long term (current) use of aspirin: Secondary | ICD-10-CM | POA: Insufficient documentation

## 2017-10-28 DIAGNOSIS — Z79899 Other long term (current) drug therapy: Secondary | ICD-10-CM

## 2017-10-28 DIAGNOSIS — I700 Atherosclerosis of aorta: Secondary | ICD-10-CM | POA: Diagnosis not present

## 2017-10-28 DIAGNOSIS — J439 Emphysema, unspecified: Secondary | ICD-10-CM

## 2017-10-28 LAB — COMPREHENSIVE METABOLIC PANEL
ALT: 13 U/L — ABNORMAL LOW (ref 17–63)
ANION GAP: 8 (ref 5–15)
AST: 22 U/L (ref 15–41)
Albumin: 3.5 g/dL (ref 3.5–5.0)
Alkaline Phosphatase: 78 U/L (ref 38–126)
BUN: 28 mg/dL — ABNORMAL HIGH (ref 6–20)
CALCIUM: 10 mg/dL (ref 8.9–10.3)
CO2: 30 mmol/L (ref 22–32)
Chloride: 100 mmol/L — ABNORMAL LOW (ref 101–111)
Creatinine, Ser: 1.04 mg/dL (ref 0.61–1.24)
Glucose, Bld: 124 mg/dL — ABNORMAL HIGH (ref 65–99)
POTASSIUM: 3.6 mmol/L (ref 3.5–5.1)
Sodium: 138 mmol/L (ref 135–145)
Total Bilirubin: 0.5 mg/dL (ref 0.3–1.2)
Total Protein: 7.3 g/dL (ref 6.5–8.1)

## 2017-10-28 LAB — CBC WITH DIFFERENTIAL/PLATELET
BASOS PCT: 1 %
Basophils Absolute: 0.1 10*3/uL (ref 0–0.1)
Eosinophils Absolute: 0.3 10*3/uL (ref 0–0.7)
Eosinophils Relative: 3 %
HEMATOCRIT: 43.1 % (ref 40.0–52.0)
Hemoglobin: 14.5 g/dL (ref 13.0–18.0)
LYMPHS PCT: 19 %
Lymphs Abs: 1.7 10*3/uL (ref 1.0–3.6)
MCH: 30.2 pg (ref 26.0–34.0)
MCHC: 33.7 g/dL (ref 32.0–36.0)
MCV: 89.8 fL (ref 80.0–100.0)
Monocytes Absolute: 0.7 10*3/uL (ref 0.2–1.0)
Monocytes Relative: 8 %
NEUTROS ABS: 6.4 10*3/uL (ref 1.4–6.5)
Neutrophils Relative %: 69 %
PLATELETS: 182 10*3/uL (ref 150–440)
RBC: 4.8 MIL/uL (ref 4.40–5.90)
RDW: 16.2 % — ABNORMAL HIGH (ref 11.5–14.5)
WBC: 9.2 10*3/uL (ref 3.8–10.6)

## 2017-10-28 LAB — PSA: Prostatic Specific Antigen: 3.5 ng/mL (ref 0.00–4.00)

## 2017-10-28 MED ORDER — PREDNISONE 5 MG PO TABS: 5.0000 mg | ORAL_TABLET | Freq: Two times a day (BID) | ORAL | 3 refills | Status: DC

## 2017-10-28 MED ORDER — DENOSUMAB 120 MG/1.7ML ~~LOC~~ SOLN
120.0000 mg | Freq: Once | SUBCUTANEOUS | Status: DC
  Filled 2017-10-28: qty 1.7

## 2017-10-28 MED ORDER — LEUPROLIDE ACETATE (4 MONTH) 30 MG IM KIT
30.0000 mg | PACK | Freq: Once | INTRAMUSCULAR | Status: AC
  Administered 2017-10-28: 30 mg via INTRAMUSCULAR
  Filled 2017-10-28: qty 30

## 2017-10-28 NOTE — Progress Notes (Signed)
[place] OFFICE PROGRESS NOTE  Patient Care Team: Tamsen Roers, MD as PCP - General (Family Medicine)   SUMMARY OF ONCOLOGIC HISTORY:  Oncology History   # AUG 2016- METASTATIC PROSTATE CA/ STAGE IV; Castrate sensitive [PSA- 48] ; mets- lumbar spine [s/p pal RT to Aug 2016; Dr.Crystal] /Pelvic LN; 1994- Prostate CA s/p Surgery [facility]]; Lupron q4 M; MARCH 2017- Bone scan- L4/Right pubic rami uptake; PSA- 0.1/testosterone-castrate [<3]  # Bone lesions on denosumab;DEC 2016- Recm q 27M  # NOV 2018- CASTRATE RESISTANT; declined chemo; 08/06/2017- start Zytiga+prednisone  # Left hydronephrosis s/p stenting [Dr.Budzyn]     Prostate cancer metastatic to bone [facility])    INTERVAL HISTORY:  A very pleasant 77 year old male patient with above history of prostate cancer stage IV hormone sensitive currently on Lupron every 4 months-is here for follow-up.  Patient's left hip pain is significantly improved although; pain is not completely resolved.  He continues to be in the fentanyl patch and also hydrocodone 2-3-day as needed.    Denies any nausea vomiting.  Denies any swelling in the legs.  Denies any diarrhea or constipation.   REVIEW OF SYSTEMS:  A complete 10 point review of system is done which is negative except mentioned above/history of present illness.   PAST MEDICAL HISTORY :  Past Medical History:  Diagnosis Date  . Aortic stenosis, moderate 01/30/2014  . Arthritis   . Atrial flutter ([place]) 02/02/2014  . Diabetes mellitus without complication (HCC)    diet control  . GERD (gastroesophageal reflux disease)   . Heart murmur   . Heart murmur   . HTN (hypertension) 01/30/2014  . Hypercholesteremia   . Hypertension   . Prostate cancer ([place])    metastatic  . Weakness of left leg     PAST SURGICAL HISTORY :   Past Surgical History:  Procedure Laterality Date  . CATARACT EXTRACTION W/ INTRAOCULAR LENS IMPLANT Bilateral   . COLON RESECTION    . CYSTOSCOPY  W/ RETROGRADES Left 06/08/2015   Procedure: CYSTOSCOPY WITH RETROGRADE PYELOGRAM;  Surgeon: Nickie Retort, MD;  Location: [facility];  Service: Urology;  Laterality: Left;  . CYSTOSCOPY W/ URETERAL STENT PLACEMENT Left 10/05/2015   Procedure: CYSTOSCOPY WITH STENT REPLACEMENT;  Surgeon: Nickie Retort, MD;  Location: [facility];  Service: Urology;  Laterality: Left;  . CYSTOSCOPY W/ URETERAL STENT PLACEMENT Left 01/04/2016   Procedure: CYSTOSCOPY WITH STENT REPLACEMENT;  Surgeon: Nickie Retort, MD;  Location: [facility];  Service: Urology;  Laterality: Left;  . CYSTOSCOPY W/ URETERAL STENT PLACEMENT Left 04/11/2016   Procedure: CYSTOSCOPY WITH STENT REPLACEMENT;  Surgeon: Nickie Retort, MD;  Location: [facility];  Service: Urology;  Laterality: Left;  . CYSTOSCOPY W/ URETERAL STENT PLACEMENT Left 07/18/2016   Procedure: CYSTOSCOPY WITH STENT REPLACEMENT;  Surgeon: Nickie Retort, MD;  Location: [facility];  Service: Urology;  Laterality: Left;  . CYSTOSCOPY W/ URETERAL STENT PLACEMENT Left 11/02/2016   Procedure: CYSTOSCOPY WITH STENT REPLACEMENT;  Surgeon: Nickie Retort, MD;  Location: [facility];  Service: Urology;  Laterality: Left;  . CYSTOSCOPY W/ URETERAL STENT PLACEMENT Left 02/08/2017   Procedure: CYSTOSCOPY WITH STENT REPLACEMENT;  Surgeon: Nickie Retort, MD;  Location: [facility];  Service: Urology;  Laterality: Left;  . CYSTOSCOPY W/ URETERAL STENT PLACEMENT Left 05/10/2017   Procedure: CYSTOSCOPY WITH STENT REPLACEMENT;  Surgeon: Nickie Retort, MD;  Location: [facility];  Service: Urology;  Laterality: Left;  . CYSTOSCOPY WITH STENT PLACEMENT Left  08/16/2017   Procedure: CYSTOSCOPY WITH STENT EXCHANGE;  Surgeon: Abbie Sons, MD;  Location: [facility];  Service: Urology;  Laterality: Left;  . EYE SURGERY Bilateral Sept and Oct.2012   cataract  . HERNIA REPAIR    . PROSTATECTOMY  1994    FAMILY HISTORY :   Family History  Problem Relation Age of Onset  .  Aneurysm Father   . Cancer Mother        breast    SOCIAL HISTORY:   Social History   Tobacco Use  . Smoking status: Former Smoker    Packs/day: 1.50    Years: 25.00    Pack years: 37.50    Types: Cigarettes    Last attempt to quit: 01/21/1974    Years since quitting: 43.7  . Smokeless tobacco: Never Used  Substance Use Topics  . Alcohol use: No  . Drug use: No    ALLERGIES:  has No Known Allergies.  MEDICATIONS:  Current Outpatient Medications  Medication Sig Dispense Refill  . abiraterone acetate (ZYTIGA) 250 MG tablet Take 4 tablets (1,000 mg total) by mouth daily. Take on an empty stomach 1 hour before or 2 hours after a meal 120 tablet 0  . allopurinol (ZYLOPRIM) 300 MG tablet Take 300 mg by mouth daily.     . Ascorbic Acid (VITAMIN C) 100 MG tablet Take 500 mg by mouth daily.    Marland Kitchen aspirin 81 MG tablet Take 81 mg by mouth daily. Reported on 01/03/2016    . atorvastatin (LIPITOR) 10 MG tablet Take 10 mg by mouth daily at 6 PM.     . carvedilol (COREG) 6.25 MG tablet Take 1 tablet (6.25 mg total) by mouth 2 (two) times daily with a meal. 60 tablet 2  . chlorthalidone (HYGROTON) 25 MG tablet Take 25 mg by mouth daily.     . fentaNYL (DURAGESIC - DOSED MCG/HR) 50 MCG/HR Place 1 patch (50 mcg total) onto the skin every 3 (three) days. 10 patch 0  . ferrous sulfate 325 (65 FE) MG tablet Take 325 mg by mouth daily with breakfast.     . HYDROcodone-acetaminophen (NORCO/VICODIN) 5-325 MG tablet Take 1 tablet by mouth every 8 (eight) hours as needed. 60 tablet 0  . oxybutynin (DITROPAN) 5 MG tablet Take 10 mg by mouth 3 (three) times daily.     . pantoprazole (PROTONIX) 40 MG tablet Take 1 tablet (40 mg total) by mouth daily before breakfast. 30 tablet 2  . predniSONE (DELTASONE) 5 MG tablet Take 1 tablet (5 mg total) by mouth 2 (two) times daily. Take with food 60 tablet 3  . ranitidine (ZANTAC) 150 MG tablet Take 150 mg by mouth 2 (two) times daily.     . vitamin B-12  (CYANOCOBALAMIN) 1000 MCG tablet Take 1,000 mcg by mouth daily.    . Vitamin D, Ergocalciferol, (DRISDOL) 50000 units CAPS capsule Take 50,000 Units by mouth every 7 (seven) days. tuesdays     No current facility-administered medications for this visit.    Facility-Administered Medications Ordered in Other Visits  Medication Dose Route Frequency Provider Last Rate Last Dose  . denosumab (XGEVA) injection 120 mg  120 mg Subcutaneous Once Cammie Sickle, MD        PHYSICAL EXAMINATION: ECOG PERFORMANCE STATUS: 0 - Asymptomatic  BP (!) 164/95 (BP Location: Left Arm, Patient Position: Sitting)   Pulse 64   Temp 98 F (36.7 C)   Resp 16   Ht 5\' 11"  (1.803 m)  Wt 182 lb 6.4 oz (82.7 kg)   BMI 25.44 kg/m   Filed Weights   10/28/17 0957  Weight: 182 lb 6.4 oz (82.7 kg)    GENERAL: Well-nourished well-developed; Alert, no distress and comfortable.  Accompanied by his wife.  He is walking with a walker. EYES: no pallor or icterus OROPHARYNX: no thrush or ulceration; dentures. NECK: supple, no masses felt LYMPH: no palpable lymphadenopathy in the cervical, axillary or inguinal regions LUNGS: clear to auscultation and  No wheeze or crackles HEART/CVS: regular rate & rhythm and no murmurs; No lower extremity edema ABDOMEN: abdomen soft, non-tender and normal bowel sounds Musculoskeletal:no cyanosis of digits and no clubbing. PSYCH: alert & oriented x 3 with fluent speech NEURO: no focal motor/sensory deficits SKIN:  no rashes or significant lesions  LABORATORY DATA:  I have reviewed the data as listed    Component Value Date/Time   NA 138 10/28/2017 0948   K 3.6 10/28/2017 0948   CL 100 (L) 10/28/2017 0948   CO2 30 10/28/2017 0948   GLUCOSE 124 (H) 10/28/2017 0948   BUN 28 (H) 10/28/2017 0948   CREATININE 1.04 10/28/2017 0948   CALCIUM 10.0 10/28/2017 0948   PROT 7.3 10/28/2017 0948   ALBUMIN 3.5 10/28/2017 0948   AST 22 10/28/2017 0948   ALT 13 (L) 10/28/2017 0948    ALKPHOS 78 10/28/2017 0948   BILITOT 0.5 10/28/2017 0948   GFRNONAA >60 10/28/2017 0948   GFRAA >60 10/28/2017 0948    No results found for: SPEP, UPEP  Lab Results  Component Value Date   WBC 9.2 10/28/2017   NEUTROABS 6.4 10/28/2017   HGB 14.5 10/28/2017   HCT 43.1 10/28/2017   MCV 89.8 10/28/2017   PLT 182 10/28/2017      Chemistry      Component Value Date/Time   NA 138 10/28/2017 0948   K 3.6 10/28/2017 0948   CL 100 (L) 10/28/2017 0948   CO2 30 10/28/2017 0948   BUN 28 (H) 10/28/2017 0948   CREATININE 1.04 10/28/2017 0948      Component Value Date/Time   CALCIUM 10.0 10/28/2017 0948   ALKPHOS 78 10/28/2017 0948   AST 22 10/28/2017 0948   ALT 13 (L) 10/28/2017 0948   BILITOT 0.5 10/28/2017 0948     Results for MAZEN, MARCIN (MRN [id number]) as of 03/11/2017 13:41  Ref. Range 11/21/2015 13:31 03/07/2016 14:55 06/28/2016 13:40 10/19/2016 13:35 02/25/2017 11:10  PSA Latest Ref Range: 0.00 - 4.00 ng/mL 0.12 0.06 0.02 0.10   Prostatic Specific Antigen Latest Ref Range: 0.00 - 4.00 ng/mL     0.39    IMPRESSION: Areas of bony metastatic disease in the lumbar spine, probable lower thoracic spine, and right ischium. There appears to be an increase in abnormal uptake in the lower thoracic spinous process regions at T9 and T10 as well as in portions of the L3 and L5 vertebrae compared the prior study. Radiographic correlation advised to confirm progression of bony metastatic disease in these areas. The abnormal radiotracer uptake at L4 and in the right ischium are stable. Old rib fractures show stable uptake bilaterally. Multiple areas of arthropathy noted. Fullness of the left renal collecting system has been documented previously and accounts for the decreased uptake in the left kidney compared to the right, a stable finding.  Electronically Signed   By: Lowella Grip III M.D.   On: 03/08/2017 13:01   IMPRESSION: 1. No acute intrathoracic pathology. No CT  evidence of pulmonary  embolus. 2. Mild emphysema. 3. **An incidental finding of potential clinical significance has been found. Right thyroid hypodense nodule. Further evaluation with ultrasound on a nonemergent basis recommended.** 4. Aortic Atherosclerosis (ICD10-I70.0) and Emphysema (ICD10-J43.9).  Electronically Signed   By: Anner Crete M.D.   On: 06/11/2017 22:10  IMPRESSION: 1. New radiotracer avid skeletal metastasis in the T9 vertebral body without associated CT findings. 2. Recurrence of prostate carcinoma bone metastasis at pathologic RIGHT rib fracture with intense radiotracer activity and new cortical erosion and expansion. 3. No evidence of metastatic adenopathy in the pelvis or abdomen. 4. No evidence local recurrence the prostate bed. 5. No radiotracer activity associated sclerotic lesions with L4 vertebral body. In   Electronically Signed   By: Suzy Bouchard M.D.   On: 08/08/2017 15:07   IMPRESSION: Stable sclerotic right inferior pubic ramus metastasis. No additional bony abnormality.  Electronically Signed   By: Rolm Baptise M.D.   On: 06/12/2017 19:31    IMPRESSION: 1. 16 mm T8 metastasis without pathologic fracture. Expansile RIGHT posterior sixth rib metastasis. 2. Mild degenerative change of the spine, less than expected for age. No canal stenosis or neural foraminal narrowing.   Electronically Signed   By: Elon Alas M.D.   On: 06/27/2017 16:58  Results for HAIDEN, RAWLINSON (MRN [id number]) as of 10/28/2017 10:35  Ref. 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 NOv 11th  2018-T spine MRI shows progressive lesion in his thoracic spine. Aux PET- right post rib uptake [not symptomatic]; left actebular uptake [symptomatic]; [PSA not a good marker];   # continue zytiga+prednisone [mid December 2018]; tolerating without any side effects-except for mild hypokalemia.  PSA slightly going up most recent February 3 0.1; however PSA is not a good marker for the patient.  In few months would recommend auxmin PET scan if patient is getting symptomatic  #Left hip pain/acetabular uptake-s/p RT [finished 30th jan 2019]. #Pain control-currently improved on fentanyl  50 mcg;and also  On Percocet 7.5 325 every 8 hours [currently twice a day; will call for script]  # Bone mets-Xgeva q. Monthly.  Tolerating it without any side effects.  # hypokal-sec to zytiga- improved. todya K- 3.6.    # Left ureteral stent/hydronephrosis- likely from pelvic adenopathy from his malignancy/ CKD.   # follow up in 4 weeks/labs/ X-geva.    Orders Placed This Encounter  Procedures  . CBC with Differential    Standing Status:   Future    Standing Expiration Date:   10/29/2018  . Comprehensive metabolic panel    Standing Status:   Future    Standing Expiration Date:   10/29/2018  . PSA    Standing Status:   Future    Standing Expiration Date:   10/29/2018     Cammie Sickle, MD 10/28/2017 11:49 AM

## 2017-10-28 NOTE — Assessment & Plan Note (Addendum)
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 NOv 11th 2018-T spine MRI shows progressive lesion in his thoracic spine. Aux PET- right post rib uptake [not symptomatic]; left actebular uptake [symptomatic]; [PSA not a good marker];   # continue zytiga+prednisone [mid December 2018]; tolerating without any side effects-except for mild hypokalemia.  PSA slightly going up most recent February 3 0.1; however PSA is not a good marker for the patient.  In few months would recommend auxmin PET scan if patient is getting symptomatic  #Left hip pain/acetabular uptake-s/p RT [finished 30th jan 2019]. #Pain control-currently improved on fentanyl  50 mcg;and also  On Percocet 7.5 325 every 8 hours [currently twice a day; will call for script]  # Bone mets-Xgeva q. Monthly.  Tolerating it without any side effects.  # hypokal-sec to zytiga- improved. todya K- 3.6.    # Left ureteral stent/hydronephrosis- likely from pelvic adenopathy from his malignancy/ CKD.   # follow up in 4 weeks/labs/ X-geva.

## 2017-10-28 NOTE — Progress Notes (Signed)
Patient received Lupron today but NOT the Xgeva due to insurance.

## 2017-10-28 NOTE — Progress Notes (Signed)
Patient here for treatment. He has no concerns today.

## 2017-11-04 ENCOUNTER — Telehealth: Payer: Self-pay | Admitting: Internal Medicine

## 2017-11-04 NOTE — Telephone Encounter (Signed)
Oral Oncology Patient Advocate Encounter  Faxed a copy of patients new insurance card over to [employer] [phone].   [place] Patient Advocate [phone] 11/04/2017 11:31 AM

## 2017-11-15 ENCOUNTER — Telehealth: Payer: Self-pay | Admitting: *Deleted

## 2017-11-15 NOTE — Telephone Encounter (Signed)
Per Dr.B - the Zytiga could contribute to the high blood pressure. Please advise patient to keep a log of his blood pressures and keep apts as scheduled next week.

## 2017-11-15 NOTE — Telephone Encounter (Signed)
Patient called to report irregular BP readings ranging from 180/110 to 149/52. He has seen his PCP at which time his BP was normal. I advised him to contact his cardioligist for follow up if his BP continued to be elevated.

## 2017-11-15 NOTE — Telephone Encounter (Signed)
Called patient instructed him to keep a log of BP until his next visit.

## 2017-11-18 ENCOUNTER — Other Ambulatory Visit: Payer: Self-pay | Admitting: *Deleted

## 2017-11-19 MED ORDER — HYDROCODONE-ACETAMINOPHEN 5-325 MG PO TABS: 1.0000 | ORAL_TABLET | Freq: Three times a day (TID) | ORAL | 0 refills | Status: DC | PRN

## 2017-11-25 ENCOUNTER — Inpatient Hospital Stay: Payer: Medicare PPO

## 2017-11-25 ENCOUNTER — Inpatient Hospital Stay: Payer: Medicare PPO | Attending: Internal Medicine

## 2017-11-25 ENCOUNTER — Inpatient Hospital Stay: Payer: Medicare PPO | Admitting: Internal Medicine

## 2017-11-25 ENCOUNTER — Encounter: Payer: Self-pay | Admitting: Internal Medicine

## 2017-11-25 VITALS — BP 121/75 | HR 73 | Temp 97.5°F | Resp 16 | Wt 189.0 lb

## 2017-11-25 DIAGNOSIS — R011 Cardiac murmur, unspecified: Secondary | ICD-10-CM | POA: Diagnosis not present

## 2017-11-25 DIAGNOSIS — C7951 Secondary malignant neoplasm of bone: Secondary | ICD-10-CM

## 2017-11-25 DIAGNOSIS — Z192 Hormone resistant malignancy status: Secondary | ICD-10-CM | POA: Diagnosis not present

## 2017-11-25 DIAGNOSIS — E119 Type 2 diabetes mellitus without complications: Secondary | ICD-10-CM | POA: Insufficient documentation

## 2017-11-25 DIAGNOSIS — Z79899 Other long term (current) drug therapy: Secondary | ICD-10-CM | POA: Diagnosis not present

## 2017-11-25 DIAGNOSIS — G893 Neoplasm related pain (acute) (chronic): Secondary | ICD-10-CM | POA: Insufficient documentation

## 2017-11-25 DIAGNOSIS — K219 Gastro-esophageal reflux disease without esophagitis: Secondary | ICD-10-CM | POA: Diagnosis not present

## 2017-11-25 DIAGNOSIS — M129 Arthropathy, unspecified: Secondary | ICD-10-CM | POA: Diagnosis not present

## 2017-11-25 DIAGNOSIS — I4892 Unspecified atrial flutter: Secondary | ICD-10-CM

## 2017-11-25 DIAGNOSIS — E7800 Pure hypercholesterolemia, unspecified: Secondary | ICD-10-CM

## 2017-11-25 DIAGNOSIS — J439 Emphysema, unspecified: Secondary | ICD-10-CM | POA: Diagnosis not present

## 2017-11-25 DIAGNOSIS — I350 Nonrheumatic aortic (valve) stenosis: Secondary | ICD-10-CM | POA: Diagnosis not present

## 2017-11-25 DIAGNOSIS — Z7982 Long term (current) use of aspirin: Secondary | ICD-10-CM

## 2017-11-25 DIAGNOSIS — I10 Essential (primary) hypertension: Secondary | ICD-10-CM | POA: Diagnosis not present

## 2017-11-25 DIAGNOSIS — N1330 Unspecified hydronephrosis: Secondary | ICD-10-CM | POA: Diagnosis not present

## 2017-11-25 DIAGNOSIS — C61 Malignant neoplasm of prostate: Secondary | ICD-10-CM | POA: Insufficient documentation

## 2017-11-25 DIAGNOSIS — I700 Atherosclerosis of aorta: Secondary | ICD-10-CM | POA: Insufficient documentation

## 2017-11-25 DIAGNOSIS — Z803 Family history of malignant neoplasm of breast: Secondary | ICD-10-CM | POA: Diagnosis not present

## 2017-11-25 DIAGNOSIS — Z87891 Personal history of nicotine dependence: Secondary | ICD-10-CM

## 2017-11-25 LAB — COMPREHENSIVE METABOLIC PANEL
ALK PHOS: 71 U/L (ref 38–126)
ALT: 16 U/L — AB (ref 17–63)
AST: 27 U/L (ref 15–41)
Albumin: 3.5 g/dL (ref 3.5–5.0)
Anion gap: 10 (ref 5–15)
BILIRUBIN TOTAL: 0.7 mg/dL (ref 0.3–1.2)
BUN: 36 mg/dL — AB (ref 6–20)
CALCIUM: 10.2 mg/dL (ref 8.9–10.3)
CHLORIDE: 102 mmol/L (ref 101–111)
CO2: 23 mmol/L (ref 22–32)
CREATININE: 1.23 mg/dL (ref 0.61–1.24)
GFR calc Af Amer: >60 mL/min (ref >60)
GFR, EST NON AFRICAN AMERICAN: 55 mL/min — AB (ref >60)
Glucose, Bld: 153 mg/dL — ABNORMAL HIGH (ref 65–99)
Potassium: 3.4 mmol/L — ABNORMAL LOW (ref 3.5–5.1)
Sodium: 135 mmol/L (ref 135–145)
Total Protein: 6.6 g/dL (ref 6.5–8.1)

## 2017-11-25 LAB — CBC WITH DIFFERENTIAL/PLATELET
BASOS ABS: 0.1 10*3/uL (ref 0–0.1)
Basophils Relative: 1 %
Eosinophils Absolute: 0.2 10*3/uL (ref 0–0.7)
Eosinophils Relative: 3 %
HEMATOCRIT: 41.5 % (ref 40.0–52.0)
HEMOGLOBIN: 14.2 g/dL (ref 13.0–18.0)
LYMPHS ABS: 1.6 10*3/uL (ref 1.0–3.6)
LYMPHS PCT: 18 %
MCH: 30.8 pg (ref 26.0–34.0)
MCHC: 34.2 g/dL (ref 32.0–36.0)
MCV: 90 fL (ref 80.0–100.0)
Monocytes Absolute: 0.7 10*3/uL (ref 0.2–1.0)
Monocytes Relative: 8 %
NEUTROS ABS: 6.1 10*3/uL (ref 1.4–6.5)
NEUTROS PCT: 70 %
PLATELETS: 155 10*3/uL (ref 150–440)
RBC: 4.61 MIL/uL (ref 4.40–5.90)
RDW: 15.5 % — ABNORMAL HIGH (ref 11.5–14.5)
WBC: 8.8 10*3/uL (ref 3.8–10.6)

## 2017-11-25 LAB — PSA: PROSTATIC SPECIFIC ANTIGEN: 6.03 ng/mL — AB (ref 0.00–4.00)

## 2017-11-25 MED ORDER — DENOSUMAB 120 MG/1.7ML ~~LOC~~ SOLN
120.0000 mg | Freq: Once | SUBCUTANEOUS | Status: AC
  Administered 2017-11-25: 120 mg via SUBCUTANEOUS
  Filled 2017-11-25: qty 1.7

## 2017-11-25 MED ORDER — FENTANYL 50 MCG/HR TD PT72: 50.0000 ug | MEDICATED_PATCH | TRANSDERMAL | 0 refills | Status: DC

## 2017-11-25 NOTE — Progress Notes (Signed)
[name] OFFICE PROGRESS NOTE  Patient Care Team: Tamsen Roers, MD as PCP - General (Family Medicine)   SUMMARY OF ONCOLOGIC HISTORY:  Oncology History   # AUG 2016- METASTATIC PROSTATE CA/ STAGE IV; Castrate sensitive [PSA- 48] ; mets- lumbar spine [s/p pal RT to Aug 2016; Dr.Crystal] /Pelvic LN; 1994- Prostate CA s/p Surgery [facility]]; Lupron q4 M; MARCH 2017- Bone scan- L4/Right pubic rami uptake; PSA- 0.1/testosterone-castrate [<3]  # Bone lesions on denosumab;DEC 2016- Recm q 5M  # NOV 2018- CASTRATE RESISTANT; declined chemo; 08/06/2017- start Zytiga+prednisone  # Left hydronephrosis s/p stenting [Dr.Budzyn]     Prostate cancer metastatic to bone [facility])    INTERVAL HISTORY:  A very pleasant 77 year old male patient with above history of prostate cancer stage IV hormone sensitive currently on Lupron every 4 months-is here for follow-up.  Patient is complaining of elevated blood pressures at home.  He denies any leg swelling.  Denies any headaches.  Patient's left hip pain is significantly improved although; pain is not completely resolved.  He continues to be in the fentanyl patch and also hydrocodone 2-3-day as needed.  No diarrhea or constipation.  Denies anyREVIEW OF SYSTEMS:  A complete 10 point review of system is done which is negative except mentioned above/history of present illness.   PAST MEDICAL HISTORY :  Past Medical History:  Diagnosis Date  . Aortic stenosis, moderate 01/30/2014  . Arthritis   . Atrial flutter ([place]) 02/02/2014  . Diabetes mellitus without complication (HCC)    diet control  . GERD (gastroesophageal reflux disease)   . Heart murmur   . Heart murmur   . HTN (hypertension) 01/30/2014  . Hypercholesteremia   . Hypertension   . Prostate cancer ([place])    metastatic  . Weakness of left leg     PAST SURGICAL HISTORY :   Past Surgical History:  Procedure Laterality Date  . CATARACT EXTRACTION W/ INTRAOCULAR LENS IMPLANT  Bilateral   . COLON RESECTION    . CYSTOSCOPY W/ RETROGRADES Left 06/08/2015   Procedure: CYSTOSCOPY WITH RETROGRADE PYELOGRAM;  Surgeon: Nickie Retort, MD;  Location: [facility] ORS;  Service: Urology;  Laterality: Left;  . CYSTOSCOPY W/ URETERAL STENT PLACEMENT Left 10/05/2015   Procedure: CYSTOSCOPY WITH STENT REPLACEMENT;  Surgeon: Nickie Retort, MD;  Location: [facility];  Service: Urology;  Laterality: Left;  . CYSTOSCOPY W/ URETERAL STENT PLACEMENT Left 01/04/2016   Procedure: CYSTOSCOPY WITH STENT REPLACEMENT;  Surgeon: Nickie Retort, MD;  Location: [facility];  Service: Urology;  Laterality: Left;  . CYSTOSCOPY W/ URETERAL STENT PLACEMENT Left 04/11/2016   Procedure: CYSTOSCOPY WITH STENT REPLACEMENT;  Surgeon: Nickie Retort, MD;  Location: [facility] OR[facility];  Service: Urology;  Laterality: Left;  . CYSTOSCOPY W/ URETERAL STENT PLACEMENT Left 07/18/2016   Procedure: CYSTOSCOPY WITH STENT REPLACEMENT;  Surgeon: Nickie Retort, MD;  Location: [facility];  Service: Urology;  Laterality: Left;  . CYSTOSCOPY W/ URETERAL STENT PLACEMENT Left 11/02/2016   Procedure: CYSTOSCOPY WITH STENT REPLACEMENT;  Surgeon: Nickie Retort, MD;  Location: [facility];  Service: Urology;  Laterality: Left;  . CYSTOSCOPY W/ URETERAL STENT PLACEMENT Left 02/08/2017   Procedure: CYSTOSCOPY WITH STENT REPLACEMENT;  Surgeon: Nickie Retort, MD;  Location: [facility];  Service: Urology;  Laterality: Left;  . CYSTOSCOPY W/ URETERAL STENT PLACEMENT Left 05/10/2017   Procedure: CYSTOSCOPY WITH STENT REPLACEMENT;  Surgeon: Nickie Retort, MD;  Location: [facility];  Service: Urology;  Laterality: Left;  .  CYSTOSCOPY WITH STENT PLACEMENT Left 08/16/2017   Procedure: CYSTOSCOPY WITH STENT EXCHANGE;  Surgeon: Abbie Sons, MD;  Location: [facility];  Service: Urology;  Laterality: Left;  . EYE SURGERY Bilateral Sept and Oct.2012   cataract  . HERNIA REPAIR    . PROSTATECTOMY  1994    FAMILY HISTORY :   Family  History  Problem Relation Age of Onset  . Aneurysm Father   . Cancer Mother        breast    SOCIAL HISTORY:   Social History   Tobacco Use  . Smoking status: Former Smoker    Packs/day: 1.50    Years: 25.00    Pack years: 37.50    Types: Cigarettes    Last attempt to quit: 01/21/1974    Years since quitting: 43.8  . Smokeless tobacco: Never Used  Substance Use Topics  . Alcohol use: No  . Drug use: No    ALLERGIES:  has No Known Allergies.  MEDICATIONS:  Current Outpatient Medications  Medication Sig Dispense Refill  . abiraterone acetate (ZYTIGA) 250 MG tablet Take 4 tablets (1,000 mg total) by mouth daily. Take on an empty stomach 1 hour before or 2 hours after a meal 120 tablet 0  . allopurinol (ZYLOPRIM) 300 MG tablet Take 300 mg by mouth daily.     . Ascorbic Acid (VITAMIN C) 100 MG tablet Take 500 mg by mouth daily.    Marland Kitchen aspirin 81 MG tablet Take 81 mg by mouth daily. Reported on 01/03/2016    . atorvastatin (LIPITOR) 10 MG tablet Take 10 mg by mouth daily at 6 PM.     . carvedilol (COREG) 6.25 MG tablet Take 1 tablet (6.25 mg total) by mouth 2 (two) times daily with a meal. 60 tablet 2  . chlorthalidone (HYGROTON) 25 MG tablet Take 25 mg by mouth daily.     . fentaNYL (DURAGESIC - DOSED MCG/HR) 50 MCG/HR Place 1 patch (50 mcg total) onto the skin every 3 (three) days. 10 patch 0  . ferrous sulfate 325 (65 FE) MG tablet Take 325 mg by mouth daily with breakfast.     . HYDROcodone-acetaminophen (NORCO/VICODIN) 5-325 MG tablet Take 1 tablet by mouth every 8 (eight) hours as needed. 60 tablet 0  . oxybutynin (DITROPAN) 5 MG tablet Take 10 mg by mouth 3 (three) times daily.     . pantoprazole (PROTONIX) 40 MG tablet Take 1 tablet (40 mg total) by mouth daily before breakfast. 30 tablet 2  . predniSONE (DELTASONE) 5 MG tablet Take 1 tablet (5 mg total) by mouth 2 (two) times daily. Take with food 60 tablet 3  . ranitidine (ZANTAC) 150 MG tablet Take 150 mg by mouth 2 (two)  times daily.     . vitamin B-12 (CYANOCOBALAMIN) 1000 MCG tablet Take 1,000 mcg by mouth daily.    . Vitamin D, Ergocalciferol, (DRISDOL) 50000 units CAPS capsule Take 50,000 Units by mouth every 7 (seven) days. tuesdays     No current facility-administered medications for this visit.     PHYSICAL EXAMINATION: ECOG PERFORMANCE STATUS: 0 - Asymptomatic  BP 121/75 (BP Location: Left Arm, Patient Position: Sitting)   Pulse 73   Temp (!) 97.5 F (36.4 C) (Tympanic)   Resp 16   Wt 189 lb (85.7 kg)   BMI 26.36 kg/m   Filed Weights   11/25/17 1024  Weight: 189 lb (85.7 kg)    GENERAL: Well-nourished well-developed; Alert, no distress and comfortable.  Accompanied  by his wife.  He is walking with a walker. EYES: no pallor or icterus OROPHARYNX: no thrush or ulceration; dentures. NECK: supple, no masses felt LYMPH: no palpable lymphadenopathy in the cervical, axillary or inguinal regions LUNGS: clear to auscultation and  No wheeze or crackles HEART/CVS: regular rate & rhythm and no murmurs; No lower extremity edema ABDOMEN: abdomen soft, non-tender and normal bowel sounds Musculoskeletal:no cyanosis of digits and no clubbing. PSYCH: alert & oriented x 3 with fluent speech NEURO: no focal motor/sensory deficits SKIN:  no rashes or significant lesions  LABORATORY DATA:  I have reviewed the data as listed    Component Value Date/Time   NA 135 11/25/2017 1000   K 3.4 (L) 11/25/2017 1000   CL 102 11/25/2017 1000   CO2 23 11/25/2017 1000   GLUCOSE 153 (H) 11/25/2017 1000   BUN 36 (H) 11/25/2017 1000   CREATININE 1.23 11/25/2017 1000   CALCIUM 10.2 11/25/2017 1000   PROT 6.6 11/25/2017 1000   ALBUMIN 3.5 11/25/2017 1000   AST 27 11/25/2017 1000   ALT 16 (L) 11/25/2017 1000   ALKPHOS 71 11/25/2017 1000   BILITOT 0.7 11/25/2017 1000   GFRNONAA 55 (L) 11/25/2017 1000   GFRAA >60 11/25/2017 1000    No results found for: SPEP, UPEP  Lab Results  Component Value Date   WBC  8.8 11/25/2017   NEUTROABS 6.1 11/25/2017   HGB 14.2 11/25/2017   HCT 41.5 11/25/2017   MCV 90.0 11/25/2017   PLT 155 11/25/2017      Chemistry      Component Value Date/Time   NA 135 11/25/2017 1000   K 3.4 (L) 11/25/2017 1000   CL 102 11/25/2017 1000   CO2 23 11/25/2017 1000   BUN 36 (H) 11/25/2017 1000   CREATININE 1.23 11/25/2017 1000      Component Value Date/Time   CALCIUM 10.2 11/25/2017 1000   ALKPHOS 71 11/25/2017 1000   AST 27 11/25/2017 1000   ALT 16 (L) 11/25/2017 1000   BILITOT 0.7 11/25/2017 1000     Results for [name], [name] (MRN [id number]) as of 03/11/2017 13:41  Ref. Range 11/21/2015 13:31 03/07/2016 14:55 06/28/2016 13:40 10/19/2016 13:35 02/25/2017 11:10  PSA Latest Ref Range: 0.00 - 4.00 ng/mL 0.12 0.06 0.02 0.10   Prostatic Specific Antigen Latest Ref Range: 0.00 - 4.00 ng/mL     0.39    IMPRESSION: Areas of bony metastatic disease in the lumbar spine, probable lower thoracic spine, and right ischium. There appears to be an increase in abnormal uptake in the lower thoracic spinous process regions at T9 and T10 as well as in portions of the L3 and L5 vertebrae compared the prior study. Radiographic correlation advised to confirm progression of bony metastatic disease in these areas. The abnormal radiotracer uptake at L4 and in the right ischium are stable. Old rib fractures show stable uptake bilaterally. Multiple areas of arthropathy noted. Fullness of the left renal collecting system has been documented previously and accounts for the decreased uptake in the left kidney compared to the right, a stable finding.  Electronically Signed   By: Lowella Grip III M.D.   On: 03/08/2017 13:01   IMPRESSION: 1. No acute intrathoracic pathology. No CT evidence of pulmonary embolus. 2. Mild emphysema. 3. **An incidental finding of potential clinical significance has been found. Right thyroid hypodense nodule. Further evaluation with ultrasound on a  nonemergent basis recommended.** 4. Aortic Atherosclerosis (ICD10-I70.0) and Emphysema (ICD10-J43.9).  Electronically Signed  By: Anner Crete M.D.   On: 06/11/2017 22:10  IMPRESSION: 1. New radiotracer avid skeletal metastasis in the T9 vertebral body without associated CT findings. 2. Recurrence of prostate carcinoma bone metastasis at pathologic RIGHT rib fracture with intense radiotracer activity and new cortical erosion and expansion. 3. No evidence of metastatic adenopathy in the pelvis or abdomen. 4. No evidence local recurrence the prostate bed. 5. No radiotracer activity associated sclerotic lesions with L4 vertebral body. In   Electronically Signed   By: Suzy Bouchard M.D.   On: 08/08/2017 15:07   IMPRESSION: Stable sclerotic right inferior pubic ramus metastasis. No additional bony abnormality.  Electronically Signed   By: Rolm Baptise M.D.   On: 06/12/2017 19:31    IMPRESSION: 1. 16 mm T8 metastasis without pathologic fracture. Expansile RIGHT posterior sixth rib metastasis. 2. Mild degenerative change of the spine, less than expected for age. No canal stenosis or neural foraminal narrowing.   Electronically Signed   By: Elon Alas M.D.   On: 06/27/2017 16:58  Results for [name], [name] (MRN [id number]) as of 11/26/2017 17:35  Ref. 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 DEC  Aux PET- right post rib uptake [not symptomatic]; left actebular uptake [symptomatic]; [PSA not a good marker];   # continue zytiga+prednisone [mid December 2018]; tolerating well except for elevated  Blood pressure. Recommend cutting down the dose of Zytiga to 3 pills a day; if BP not well controlled; recommend adding anti-HTN  [previoulsy on verapamil 240]  # Left hip pain/acetabular uptake-s/p RT [finished 30th jan 2019]. Pain control-currently improved on fentanyl  50 mcg [script called];and also  On Percocet 7.5 325 every 8 hours.   # Elevated Blood pressure- 150-170s/90-100s- sec to Zytiga; cut the dose to 3 pills/day. Continue log.   # Bone mets-Xgeva q. Monthly.  Tolerating it without any side effects.  # hypokal-sec to zytiga- improved. todya K- 3.6.    # Left ureteral stent/hydronephrosis- likely from pelvic adenopathy from his malignancy/ CKD.   #Molecular testing/NGS-not done; will repeat imaging for biopsy.   # follow up in 1 months/labs- PSA; x-geva.   Addendum: Patient's PSA-is elevated at 6; concerns of progression.  Recommend evaluation with Dr. Donella Stade for [place].    Orders Placed This Encounter  Procedures  . CBC with Differential/Platelet    Standing Status:   Future    Standing Expiration Date:   11/26/2018  . Comprehensive metabolic panel    Standing Status:   Future    Standing Expiration Date:   11/26/2018  . PSA    Standing Status:   Future    Standing Expiration Date:   11/26/2018     Cammie Sickle, MD 11/26/2017 5:38 PM

## 2017-11-25 NOTE — Assessment & Plan Note (Addendum)
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 DEC  Aux PET- right post rib uptake [not symptomatic]; left actebular uptake [symptomatic]; [PSA not a good marker];   # continue zytiga+prednisone [mid December 2018]; tolerating well except for elevated Blood pressure. Recommend cutting down the dose of Zytiga to 3 pills a day; if BP not well controlled; recommend adding anti-HTN  [previoulsy on verapamil 240]  # Left hip pain/acetabular uptake-s/p RT [finished 30th jan 2019]. Pain control-currently improved on fentanyl  50 mcg [script called];and also  On Percocet 7.5 325 every 8 hours.   # Elevated Blood pressure- 150-170s/90-100s- sec to Zytiga; cut the dose to 3 pills/day. Continue log.   # Bone mets-Xgeva q. Monthly.  Tolerating it without any side effects.  # hypokal-sec to zytiga- improved. todya K- 3.6.    # Left ureteral stent/hydronephrosis- likely from pelvic adenopathy from his malignancy/ CKD.   #Molecular testing/NGS-not done; will repeat imaging for biopsy.   # follow up in 1 months/labs- PSA; x-geva.   Addendum: Patient's PSA-is elevated at 6; concerns of progression.  Recommend evaluation with Dr. Donella Stade for [place].

## 2017-11-26 ENCOUNTER — Telehealth: Payer: Self-pay | Admitting: Internal Medicine

## 2017-11-26 NOTE — Telephone Encounter (Signed)
[name]-please inform patient/wife that PSA is elevated 6.  And recommend evaluation with Dr. Donella Stade for radiation treatment option called xofigo for his cancer in the bones.  Please make an appointment with Dr. Donella Stade within a week or so.   Thx.

## 2017-11-27 NOTE — Telephone Encounter (Signed)
Apt on 4/19

## 2017-12-04 ENCOUNTER — Other Ambulatory Visit: Payer: Self-pay | Admitting: Radiology

## 2017-12-06 ENCOUNTER — Encounter: Payer: Self-pay | Admitting: Radiation Oncology

## 2017-12-06 ENCOUNTER — Other Ambulatory Visit: Payer: Self-pay

## 2017-12-06 ENCOUNTER — Ambulatory Visit
Admission: RE | Admit: 2017-12-06 | Discharge: 2017-12-06 | Disposition: A | Discharge status: Home / Self Care | Payer: Medicare PPO | Source: Ambulatory Visit | Attending: Radiation Oncology | Admitting: Radiation Oncology

## 2017-12-06 VITALS — BP 158/85 | HR 78 | Temp 96.8°F | Resp 20 | Wt 191.5 lb

## 2017-12-06 DIAGNOSIS — C61 Malignant neoplasm of prostate: Secondary | ICD-10-CM | POA: Diagnosis present

## 2017-12-06 DIAGNOSIS — C7951 Secondary malignant neoplasm of bone: Secondary | ICD-10-CM | POA: Diagnosis present

## 2017-12-06 DIAGNOSIS — I10 Essential (primary) hypertension: Secondary | ICD-10-CM | POA: Diagnosis not present

## 2017-12-06 NOTE — Progress Notes (Signed)
Radiation Oncology Old patient new area evaluation for Xofigo Follow up Note  Name: [name]   Date:   12/06/2017 MRN:  [id number] DOB: [birth year]    [name] presents to the clinic today for follow-up for castrate resistant stage IV prostate cancer for possible Xofigo.  REFERRING PROVIDER: Tamsen Roers, MD  HPI: patient is a 77 year old male well known to our department having received radiation therapy in the past to his left hip and lumbar spine for stage IV castrate resistant metastatic prostate cancer. He has been on Zytiga and prednisone. He continues on fentanyl for pain mostly in his right hip..he has had some hypertension which is necessitated a decrease in his Zytiga.his most recent PSA was 6 concerning for progression he has been referred back to me for consideration of possible Xofigo  COMPLICATIONS OF TREATMENT: none  FOLLOW UP COMPLIANCE: keeps appointments   PHYSICAL EXAM:  BP (!) 158/85   Pulse 78   Temp (!) 96.8 F (36 C)   Resp 20   Wt 191 lb 7.5 oz (86.8 kg)   BMI 26.70 kg/m  Deep palpation of the spine does not elicit pain range of motion of his right hip does elicit some pain left hip within normal limits.Motor and sensory levels in his lower extremities are equal and symmetric. Well-developed well-nourished patient in NAD. HEENT reveals PERLA, EOMI, discs not visualized.  Oral cavity is clear. No oral mucosal lesions are identified. Neck is clear without evidence of cervical or supraclavicular adenopathy. Lungs are clear to A&P. Cardiac examination is essentially unremarkable with regular rate and rhythm without murmur rub or thrill. Abdomen is benign with no organomegaly or masses noted. Motor sensory and DTR levels are equal and symmetric in the upper and lower extremities. Cranial nerves II through XII are grossly intact. Proprioception is intact. No peripheral adenopathy or edema is identified. No motor or sensory levels are noted. Crude visual fields  are within normal range.  RADIOLOGY RESULTS: prior PET/CT scan reviewed  PLAN: at this time patient's blood count are suitable for macro X treatment. Risks and benefits including allergic reaction possible effects on his bone marrow and other side effects such as diarrhea and nausea all were discussed with the patient. We will arrange for the patient to be treated with radium 225 in the near future. Patient wife both comp and my treatment plan well and have accepted treatment.  I would like to take this opportunity to thank you for allowing me to participate in the care of your patient.Noreene Filbert, MD

## 2017-12-10 ENCOUNTER — Encounter
Admission: RE | Admit: 2017-12-10 | Discharge: 2017-12-10 | Disposition: A | Discharge status: Home / Self Care | Payer: Medicare PPO | Source: Ambulatory Visit | Attending: Urology | Admitting: Urology

## 2017-12-10 ENCOUNTER — Telehealth: Payer: Self-pay | Admitting: Radiology

## 2017-12-10 ENCOUNTER — Other Ambulatory Visit: Payer: Self-pay

## 2017-12-10 DIAGNOSIS — I444 Left anterior fascicular block: Secondary | ICD-10-CM | POA: Insufficient documentation

## 2017-12-10 DIAGNOSIS — I10 Essential (primary) hypertension: Secondary | ICD-10-CM | POA: Insufficient documentation

## 2017-12-10 DIAGNOSIS — C7951 Secondary malignant neoplasm of bone: Secondary | ICD-10-CM | POA: Diagnosis not present

## 2017-12-10 DIAGNOSIS — C61 Malignant neoplasm of prostate: Secondary | ICD-10-CM | POA: Diagnosis not present

## 2017-12-10 DIAGNOSIS — Z01818 Encounter for other preprocedural examination: Secondary | ICD-10-CM | POA: Insufficient documentation

## 2017-12-10 HISTORY — DX: Personal history of urinary calculi: Z87.442

## 2017-12-10 LAB — BASIC METABOLIC PANEL
ANION GAP: 9 (ref 5–15)
BUN: 32 mg/dL — ABNORMAL HIGH (ref 6–20)
CALCIUM: 9.9 mg/dL (ref 8.9–10.3)
CO2: 26 mmol/L (ref 22–32)
CREATININE: 1.04 mg/dL (ref 0.61–1.24)
Chloride: 104 mmol/L (ref 101–111)
GFR calc non Af Amer: >60 mL/min (ref >60)
Glucose, Bld: 151 mg/dL — ABNORMAL HIGH (ref 65–99)
Potassium: 3.2 mmol/L — ABNORMAL LOW (ref 3.5–5.1)
SODIUM: 139 mmol/L (ref 135–145)

## 2017-12-10 LAB — CBC
HCT: 40.7 % (ref 40.0–52.0)
HEMOGLOBIN: 13.7 g/dL (ref 13.0–18.0)
MCH: 30.5 pg (ref 26.0–34.0)
MCHC: 33.5 g/dL (ref 32.0–36.0)
MCV: 91.1 fL (ref 80.0–100.0)
PLATELETS: 165 10*3/uL (ref 150–440)
RBC: 4.47 MIL/uL (ref 4.40–5.90)
RDW: 15.8 % — ABNORMAL HIGH (ref 11.5–14.5)
WBC: 8.8 10*3/uL (ref 3.8–10.6)

## 2017-12-10 NOTE — Telephone Encounter (Signed)
Note potassium level of 3.2. Do you want to order a supplement?

## 2017-12-10 NOTE — Telephone Encounter (Signed)
Can start potassium chloride 10 mEq daily

## 2017-12-10 NOTE — Pre-Procedure Instructions (Signed)
EKG COMPARED WITH 2018 

## 2017-12-10 NOTE — Patient Instructions (Addendum)
Your procedure is scheduled on: Tuesday 12/17/17 Report to [place]. To find out your arrival time please call [phone] between 1PM - 3PM on Monday 12/16/17.  Remember: Instructions that are not followed completely may result in serious medical risk, up to and including death, or upon the discretion of your surgeon and anesthesiologist your surgery may need to be rescheduled.     _X__ 1. Do not eat food after midnight the night before your procedure.                 No gum chewing or hard candies. You may drink clear liquids up to 2 hours                 before you are scheduled to arrive for your surgery- DO not drink clear                 liquids within 2 hours of the start of your surgery.                 Clear Liquids include:  water, apple juice without pulp, clear carbohydrate                 drink such as Clearfast or Gatorade, Black Coffee or Tea (Do not add                 anything to coffee or tea).  __X__2.  On the morning of surgery brush your teeth with toothpaste and water, you                 may rinse your mouth with mouthwash if you wish.  Do not swallow any              toothpaste of mouthwash.     _X__ 3.  No Alcohol for 24 hours before or after surgery.   _X__ 4.  Do Not Smoke or use e-cigarettes For 24 Hours Prior to Your Surgery.                 Do not use any chewable tobacco products for at least 6 hours prior to                 surgery.  ____  5.  Bring all medications with you on the day of surgery if instructed.   __X__  6.  Notify your doctor if there is any change in your medical condition      (cold, fever, infections).     Do not wear jewelry, make-up, hairpins, clips or nail polish. Do not wear lotions, powders, or perfumes.  Do not shave 48 hours prior to surgery. Men may shave face and neck. Do not bring valuables to the hospital.    [facility] is not responsible for any belongings or  valuables.  Contacts, dentures/partials or body piercings may not be worn into surgery. Bring a case for your contacts, glasses or hearing aids, a denture cup will be supplied. Leave your suitcase in the car. After surgery it may be brought to your room. For patients admitted to the hospital, discharge time is determined by your treatment team.   Patients discharged the day of surgery will not be allowed to drive home.   Please read over the following fact sheets that you were given:   MRSA Information  __X__ Take these medicines the morning of surgery with A SIP OF WATER:  1. CARVEDILOL  2. OXYBUTYNIN IF NEEDED  3. HYDROCODONE IF NEEDED  4. PREDNISONE  5. PANTOPRAZOLE AND RANITIDINE  6.  ____ Fleet Enema (as directed)   __X__ Use CHG Soap/SAGE wipes as directed  ____ Use inhalers on the day of surgery  ____ Stop metformin/Janumet/Farxiga 2 days prior to surgery    ____ Take 1/2 of usual insulin dose the night before surgery. No insulin the morning          of surgery.   __X__ Stop Blood Thinners Coumadin/Plavix/Xarelto/Pleta/Pradaxa/Eliquis/Effient/Aspirin  on   Or contact your Surgeon, Cardiologist or Medical Doctor regarding  ability to stop your blood thinners  __X__ Stop Anti-inflammatories 7 days before surgery such as Advil, Ibuprofen, Motrin,  BC or Goodies Powder, Naprosyn, Naproxen, Aleve, Aspirin   MAY USE TYLENOL __X__ Stop all herbal supplements, fish oil or vitamin E until after surgery.    ____ Bring C-Pap to the hospital.

## 2017-12-11 ENCOUNTER — Telehealth: Payer: Self-pay | Admitting: *Deleted

## 2017-12-11 ENCOUNTER — Other Ambulatory Visit: Payer: Self-pay | Admitting: Radiology

## 2017-12-11 DIAGNOSIS — E876 Hypokalemia: Secondary | ICD-10-CM

## 2017-12-11 LAB — URINE CULTURE: CULTURE: NO GROWTH

## 2017-12-11 MED ORDER — POTASSIUM CHLORIDE ER 10 MEQ PO TBCR: 10.0000 meq | EXTENDED_RELEASE_TABLET | Freq: Every day | ORAL | 0 refills | Status: DC

## 2017-12-11 NOTE — Telephone Encounter (Signed)
Made pt aware of low potassium level & script sent to pharmacy. Pt voices understanding with no questions at this time.

## 2017-12-11 NOTE — Telephone Encounter (Signed)
Per Dr. Rogue Bussing - pt may discontinue the zytiga.  I spoke with the patient. He gave verbal understanding.

## 2017-12-11 NOTE — Telephone Encounter (Signed)
Patient called in to ask whether or not he should take the [facility] on the morning of his procedure (cystoscopy with stent exchange (4/30); he stated that he had been taking 4 Zytiga every morning, but because he was having elevated BP, Dr. Rogue Bussing reduced his dose to 3 per day. He was instructed to take his regular meds that morning, but he wasn't sure about the Zytiga. Also, he doesn't have many left and wanted to know if he should continue the Zytiga when he runs out; if so he will need it to be refilled. He mentioned something about possibly getting a shot instead, but he wasn't sure about it. Please advise. [phone]        dhs

## 2017-12-16 MED ORDER — CEFAZOLIN SODIUM-DEXTROSE 2-4 GM/100ML-% IV SOLN
2.0000 g | INTRAVENOUS | Status: AC
  Administered 2017-12-17: 2 g via INTRAVENOUS

## 2017-12-17 ENCOUNTER — Other Ambulatory Visit: Payer: Self-pay

## 2017-12-17 ENCOUNTER — Ambulatory Visit
Admission: RE | Admit: 2017-12-17 | Discharge: 2017-12-17 | Disposition: A | Discharge status: Home / Self Care | Payer: Medicare PPO | Source: Ambulatory Visit | Attending: Urology | Admitting: Urology

## 2017-12-17 ENCOUNTER — Ambulatory Visit: Payer: Medicare PPO | Admitting: Anesthesiology

## 2017-12-17 ENCOUNTER — Encounter
Admission: RE | Disposition: A | Discharge status: Home / Self Care | Payer: Self-pay | Source: Ambulatory Visit | Attending: Urology

## 2017-12-17 DIAGNOSIS — I350 Nonrheumatic aortic (valve) stenosis: Secondary | ICD-10-CM | POA: Diagnosis not present

## 2017-12-17 DIAGNOSIS — N131 Hydronephrosis with ureteral stricture, not elsewhere classified: Secondary | ICD-10-CM | POA: Insufficient documentation

## 2017-12-17 DIAGNOSIS — C7951 Secondary malignant neoplasm of bone: Secondary | ICD-10-CM | POA: Insufficient documentation

## 2017-12-17 DIAGNOSIS — I4892 Unspecified atrial flutter: Secondary | ICD-10-CM | POA: Diagnosis not present

## 2017-12-17 DIAGNOSIS — R011 Cardiac murmur, unspecified: Secondary | ICD-10-CM | POA: Diagnosis not present

## 2017-12-17 DIAGNOSIS — Z7982 Long term (current) use of aspirin: Secondary | ICD-10-CM | POA: Diagnosis not present

## 2017-12-17 DIAGNOSIS — E7800 Pure hypercholesterolemia, unspecified: Secondary | ICD-10-CM | POA: Diagnosis not present

## 2017-12-17 DIAGNOSIS — I10 Essential (primary) hypertension: Secondary | ICD-10-CM | POA: Diagnosis not present

## 2017-12-17 DIAGNOSIS — N135 Crossing vessel and stricture of ureter without hydronephrosis: Secondary | ICD-10-CM

## 2017-12-17 DIAGNOSIS — Z87442 Personal history of urinary calculi: Secondary | ICD-10-CM | POA: Diagnosis not present

## 2017-12-17 DIAGNOSIS — C61 Malignant neoplasm of prostate: Secondary | ICD-10-CM | POA: Insufficient documentation

## 2017-12-17 DIAGNOSIS — Z79899 Other long term (current) drug therapy: Secondary | ICD-10-CM | POA: Diagnosis not present

## 2017-12-17 DIAGNOSIS — K219 Gastro-esophageal reflux disease without esophagitis: Secondary | ICD-10-CM | POA: Diagnosis not present

## 2017-12-17 DIAGNOSIS — M1990 Unspecified osteoarthritis, unspecified site: Secondary | ICD-10-CM | POA: Diagnosis not present

## 2017-12-17 DIAGNOSIS — Z87891 Personal history of nicotine dependence: Secondary | ICD-10-CM | POA: Insufficient documentation

## 2017-12-17 DIAGNOSIS — N1330 Unspecified hydronephrosis: Secondary | ICD-10-CM

## 2017-12-17 DIAGNOSIS — E119 Type 2 diabetes mellitus without complications: Secondary | ICD-10-CM | POA: Diagnosis not present

## 2017-12-17 HISTORY — PX: CYSTOSCOPY WITH STENT PLACEMENT: SHX5790

## 2017-12-17 LAB — POCT I-STAT 4, (NA,K, GLUC, HGB,HCT)
Glucose, Bld: 141 mg/dL — ABNORMAL HIGH (ref 65–99)
HCT: 40 % (ref 39.0–52.0)
HEMOGLOBIN: 13.6 g/dL (ref 13.0–17.0)
Potassium: 3.7 mmol/L (ref 3.5–5.1)
Sodium: 141 mmol/L (ref 135–145)

## 2017-12-17 LAB — GLUCOSE, CAPILLARY
GLUCOSE-CAPILLARY: 147 mg/dL — AB (ref 65–99)
Glucose-Capillary: 134 mg/dL — ABNORMAL HIGH (ref 65–99)

## 2017-12-17 SURGERY — CYSTOSCOPY, WITH STENT INSERTION
Anesthesia: General | Site: Ureter | Laterality: Left | Wound class: Clean Contaminated

## 2017-12-17 MED ORDER — MIDAZOLAM HCL 2 MG/2ML IJ SOLN
INTRAMUSCULAR | Status: AC
  Filled 2017-12-17: qty 2

## 2017-12-17 MED ORDER — PHENYLEPHRINE HCL 10 MG/ML IJ SOLN
INTRAMUSCULAR | Status: DC | PRN
  Administered 2017-12-17: 100 ug via INTRAVENOUS

## 2017-12-17 MED ORDER — PHENYLEPHRINE HCL 10 MG/ML IJ SOLN
INTRAMUSCULAR | Status: AC
  Filled 2017-12-17: qty 1

## 2017-12-17 MED ORDER — IOTHALAMATE MEGLUMINE 43 % IV SOLN
INTRAVENOUS | Status: DC | PRN
  Administered 2017-12-17: 9 mL

## 2017-12-17 MED ORDER — LIDOCAINE HCL (PF) 2 % IJ SOLN
INTRAMUSCULAR | Status: AC
  Filled 2017-12-17: qty 10

## 2017-12-17 MED ORDER — FENTANYL CITRATE (PF) 100 MCG/2ML IJ SOLN
INTRAMUSCULAR | Status: DC | PRN
  Administered 2017-12-17: 50 ug via INTRAVENOUS

## 2017-12-17 MED ORDER — PROPOFOL 10 MG/ML IV BOLUS
INTRAVENOUS | Status: DC | PRN
  Administered 2017-12-17: 200 mg via INTRAVENOUS

## 2017-12-17 MED ORDER — FENTANYL CITRATE (PF) 100 MCG/2ML IJ SOLN
INTRAMUSCULAR | Status: AC
  Filled 2017-12-17: qty 2

## 2017-12-17 MED ORDER — ONDANSETRON HCL 4 MG/2ML IJ SOLN
INTRAMUSCULAR | Status: AC
  Filled 2017-12-17: qty 2

## 2017-12-17 MED ORDER — CEFAZOLIN SODIUM-DEXTROSE 2-4 GM/100ML-% IV SOLN
INTRAVENOUS | Status: AC
  Filled 2017-12-17: qty 100

## 2017-12-17 MED ORDER — ONDANSETRON HCL 4 MG/2ML IJ SOLN: 4.0000 mg | Freq: Once | INTRAMUSCULAR | Status: DC | PRN

## 2017-12-17 MED ORDER — PROPOFOL 10 MG/ML IV BOLUS
INTRAVENOUS | Status: AC
  Filled 2017-12-17: qty 40

## 2017-12-17 MED ORDER — LIDOCAINE HCL (PF) 2 % IJ SOLN
INTRAMUSCULAR | Status: DC | PRN
  Administered 2017-12-17: 60 mg

## 2017-12-17 MED ORDER — HYDROCODONE-ACETAMINOPHEN 5-325 MG PO TABS: 1.0000 | ORAL_TABLET | Freq: Three times a day (TID) | ORAL | 0 refills | Status: DC | PRN

## 2017-12-17 MED ORDER — GLYCOPYRROLATE 0.2 MG/ML IJ SOLN
INTRAMUSCULAR | Status: DC | PRN
  Administered 2017-12-17: 0.2 mg via INTRAVENOUS

## 2017-12-17 MED ORDER — SODIUM CHLORIDE 0.9 % IV SOLN
INTRAVENOUS | Status: DC
  Administered 2017-12-17: 06:00:00 via INTRAVENOUS

## 2017-12-17 MED ORDER — FENTANYL CITRATE (PF) 100 MCG/2ML IJ SOLN: 25.0000 ug | INTRAMUSCULAR | Status: DC | PRN

## 2017-12-17 MED ORDER — ONDANSETRON HCL 4 MG/2ML IJ SOLN
INTRAMUSCULAR | Status: DC | PRN
Start: 2017-12-17 — End: 2017-12-17
  Administered 2017-12-17: 4 mg via INTRAVENOUS

## 2017-12-17 MED ORDER — GLYCOPYRROLATE 0.2 MG/ML IJ SOLN
INTRAMUSCULAR | Status: AC
  Filled 2017-12-17: qty 1

## 2017-12-17 SURGICAL SUPPLY — 23 items
BAG DRAIN CYSTO-URO LG1000N (MISCELLANEOUS) ×3 IMPLANT
BRUSH SCRUB EZ  4% CHG (MISCELLANEOUS)
BRUSH SCRUB EZ 1% IODOPHOR (MISCELLANEOUS) ×3 IMPLANT
BRUSH SCRUB EZ 4% CHG (MISCELLANEOUS) IMPLANT
CATH URETL 5X70 OPEN END (CATHETERS) ×3 IMPLANT
CONRAY 43 FOR UROLOGY 50M (MISCELLANEOUS) ×3 IMPLANT
FORCEPS BIOP PIRANHA Y (CUTTING FORCEPS) ×3 IMPLANT
GLOVE BIOGEL PI IND STRL 8 (GLOVE) ×1 IMPLANT
GLOVE BIOGEL PI INDICATOR 8 (GLOVE) ×2
GOWN STANDARD XL  REUSABL (MISCELLANEOUS) ×3 IMPLANT
KIT TURNOVER CYSTO (KITS) ×3 IMPLANT
PACK CYSTO AR (MISCELLANEOUS) ×3 IMPLANT
SENSORWIRE 0.038 NOT ANGLED (WIRE) ×3
SET CYSTO W/LG BORE CLAMP LF (SET/KITS/TRAYS/PACK) ×3 IMPLANT
SOL .9 NS 3000ML IRR  AL (IV SOLUTION) ×2
SOL .9 NS 3000ML IRR UROMATIC (IV SOLUTION) ×1 IMPLANT
STENT URET 6FRX24 CONTOUR (STENTS) IMPLANT
STENT URET 6FRX26 CONTOUR (STENTS) IMPLANT
STENT URO INLAY 6FRX26CM (STENTS) ×3 IMPLANT
SURGILUBE 2OZ TUBE FLIPTOP (MISCELLANEOUS) ×3 IMPLANT
SYRINGE IRR TOOMEY STRL 70CC (SYRINGE) ×3 IMPLANT
WATER STERILE IRR 1000ML POUR (IV SOLUTION) ×3 IMPLANT
WIRE SENSOR 0.038 NOT ANGLED (WIRE) ×1 IMPLANT

## 2017-12-17 NOTE — Anesthesia Post-op Follow-up Note (Signed)
Anesthesia QCDR form completed.        

## 2017-12-17 NOTE — H&P (Signed)
12/17/2017 6:51 AM   [name] [birth year] [id number]  Referring provider: No referring provider defined for this encounter.   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  He had a radical prostatectomy in 1992. We do not have records from this procedure. The patient does not remember where this was done.   He is under the care of the cancer center receiving Lupron and Zytiga for his Stage IVcastratesensitivemetastatic prostate cancer.      PMH: Past Medical History:  Diagnosis Date  . Aortic stenosis, moderate 01/30/2014  . Arthritis   . Atrial flutter ([place]) 02/02/2014  . Diabetes mellitus without complication (HCC)    diet control  . GERD (gastroesophageal reflux disease)   . Heart murmur   . Heart murmur   . History of kidney stones   . HTN (hypertension) 01/30/2014  . Hypercholesteremia   . Hypertension   . Prostate cancer ([place])    metastatic  . Weakness of left leg     Surgical History: Past Surgical History:  Procedure Laterality Date  . CATARACT EXTRACTION W/ INTRAOCULAR LENS IMPLANT Bilateral   . COLON RESECTION    . CYSTOSCOPY W/ RETROGRADES Left 06/08/2015   Procedure: CYSTOSCOPY WITH RETROGRADE PYELOGRAM;  Surgeon: Nickie Retort, MD;  Location: [facility];  Service: Urology;  Laterality: Left;  . CYSTOSCOPY W/ URETERAL STENT PLACEMENT Left 10/05/2015   Procedure: CYSTOSCOPY WITH STENT REPLACEMENT;  Surgeon: Nickie Retort, MD;  Location: [facility];  Service: Urology;  Laterality: Left;  . CYSTOSCOPY W/ URETERAL STENT PLACEMENT Left 01/04/2016   Procedure: CYSTOSCOPY WITH STENT REPLACEMENT;  Surgeon: Nickie Retort, MD;  Location: [facility];  Service: Urology;  Laterality: Left;  . CYSTOSCOPY W/ URETERAL STENT PLACEMENT Left  04/11/2016   Procedure: CYSTOSCOPY WITH STENT REPLACEMENT;  Surgeon: Nickie Retort, MD;  Location: [facility];  Service: Urology;  Laterality: Left;  . CYSTOSCOPY W/ URETERAL STENT PLACEMENT Left 07/18/2016   Procedure: CYSTOSCOPY WITH STENT REPLACEMENT;  Surgeon: Nickie Retort, MD;  Location: [facility];  Service: Urology;  Laterality: Left;  . CYSTOSCOPY W/ URETERAL STENT PLACEMENT Left 11/02/2016   Procedure: CYSTOSCOPY WITH STENT REPLACEMENT;  Surgeon: Nickie Retort, MD;  Location: [facility];  Service: Urology;  Laterality: Left;  . CYSTOSCOPY W/ URETERAL STENT PLACEMENT Left 02/08/2017   Procedure: CYSTOSCOPY WITH STENT REPLACEMENT;  Surgeon: Nickie Retort, MD;  Location: [facility];  Service: Urology;  Laterality: Left;  . CYSTOSCOPY W/ URETERAL STENT PLACEMENT Left 05/10/2017   Procedure: CYSTOSCOPY WITH STENT REPLACEMENT;  Surgeon: Nickie Retort, MD;  Location: [facility];  Service: Urology;  Laterality: Left;  . CYSTOSCOPY WITH STENT PLACEMENT Left 08/16/2017   Procedure: CYSTOSCOPY WITH STENT EXCHANGE;  Surgeon: Abbie Sons, MD;  Location: [facility];  Service: Urology;  Laterality: Left;  . EYE SURGERY Bilateral Sept and Oct.2012   cataract  . HERNIA REPAIR    . PROSTATECTOMY  1994    Home Medications:  Medication list was reviewed  Allergies: No Known Allergies  Family History: Family History  Problem Relation Age of Onset  . Aneurysm Father   . Cancer Mother        breast    Social History:  reports that he quit smoking about 43 years ago. His smoking use included  cigarettes. He has a 37.50 pack-year smoking history. He has never used smokeless tobacco. He reports that he does not drink alcohol or use drugs.  ROS: Otherwise negative for except as per the HPI  Physical Exam: BP (!) 158/95   Pulse 73   Temp (!) 96.4 F (35.8 C) (Temporal)   Resp 16   Wt 191 lb (86.6 kg)   SpO2 99%   BMI 26.64 kg/m   Constitutional:  Alert and oriented, No acute  distress. HEENT: [place] AT, moist mucus membranes.  Trachea midline, no masses. Cardiovascular: No clubbing, cyanosis, or edema.  RRR Respiratory: Normal respiratory effort, no increased work of breathing.  Clear to auscultation GI: Abdomen is soft, nontender, nondistended, no abdominal masses GU: No CVA tenderness Lymph: No cervical or inguinal lymphadenopathy. Skin: No rashes, bruises or suspicious lesions. Neurologic: Grossly intact, no focal deficits, moving all 4 extremities. Psychiatric: Normal mood and affect.  Laboratory Data: Lab Results  Component Value Date   WBC 8.8 12/10/2017   HGB 13.6 12/17/2017   HCT 40.0 12/17/2017   MCV 91.1 12/10/2017   PLT 165 12/10/2017    Lab Results  Component Value Date   CREATININE 1.04 12/10/2017    Lab Results  Component Value Date   PSA 0.10 10/19/2016   PSA 0.02 06/28/2016   PSA 0.06 03/07/2016    Lab Results  Component Value Date   TESTOSTERONE <3 (L) 10/20/2015    Lab Results  Component Value Date   HGBA1C 5.5 01/30/2014    Urinalysis    Component Value Date/Time   COLORURINE YELLOW (A) 08/09/2017 0905   APPEARANCEUR CLEAR (A) 08/09/2017 0905   APPEARANCEUR Clear 04/06/2016 1126   LABSPEC 1.019 08/09/2017 0905   PHURINE 5.0 08/09/2017 0905   GLUCOSEU NEGATIVE 08/09/2017 0905   HGBUR NEGATIVE 08/09/2017 0905   BILIRUBINUR NEGATIVE 08/09/2017 0905   BILIRUBINUR Negative 04/06/2016 1126   KETONESUR NEGATIVE 08/09/2017 0905   PROTEINUR NEGATIVE 08/09/2017 0905   UROBILINOGEN 0.2 01/13/2014 2146   NITRITE NEGATIVE 08/09/2017 0905   LEUKOCYTESUR NEGATIVE 08/09/2017 0905   LEUKOCYTESUR Negative 04/06/2016 1126    Lab Results  Component Value Date   LABMICR See below: 04/06/2016   WBCUA 0-5 04/06/2016   RBCUA None seen 04/06/2016   LABEPIT 0-10 04/06/2016   MUCUS Present (A) 04/06/2016   BACTERIA NONE SEEN 06/11/2017    Pertinent Imaging:  Results for orders placed during the hospital encounter of 01/13/14    DG Abd 1 View   Narrative CLINICAL DATA:  Feeding tube placement.  EXAM: ABDOMEN - 1 VIEW  COMPARISON:  01/21/2014  FINDINGS: Feeding tube tip is seen with tip in the fundus of the stomach. Some residual contrast is seen within the colon. No dilated bowel loops seen.  IMPRESSION: Feeding tube tip in fundus of stomach.   Electronically Signed   By: Earle Gell M.D.   On: 01/23/2014 18:57     Results for orders placed during the hospital encounter of 02/23/17  US Renal   Narrative CLINICAL DATA:  Flank pain on left. Metastatic prostate carcinoma. Left renal stent in place  EXAM: RENAL / URINARY TRACT ULTRASOUND COMPLETE  COMPARISON:  September 12, 2015  FINDINGS: Right Kidney:  Length: 11.0 cm. Echogenicity and renal cortical thickness are within normal limits. No perinephric fluid or hydronephrosis visualized. There is a cyst arising from the upper pole right kidney measuring 3.8 x 3.2 x 3.3 cm. There is a calculus in the mid right kidney measuring 9  mm. No ureterectasis.  Left Kidney:  Length: 9.5 cm. Echogenicity and renal cortical thickness are within normal limits. No perinephric fluid or hydronephrosis visualized. There is a cyst arising in the mid to lower pole left kidney measuring 3.2 x 4.2 x 3.7 cm. A smaller cyst slightly more superior in location measures 0.8 x 0.9 x 1.1 cm. Double-J stent present on left.  Bladder:  Along the posterior wall of the urinary bladder, there is a the hypoechoic structure measuring 2.9 x 2.9 x 0.9 cm. No other lesions seen in the urinary bladder. Stent extends into the bladder from the left side.  IMPRESSION: Focal area of decreased echogenicity along the posterior wall of the urinary bladder measuring 2.9 x 2.9 x 0.9 cm. This area does not move. A small mass arising from the posterior wall of the urinary bladder must be of concern given this appearance. This finding may warrant direct visualization to further  assess. In this regard, urologic consultation advised.  Double-J stent on left. Cysts in each kidney. Nonobstructing calculus right kidney. No obstructing focus in either kidney.   Electronically Signed   By: Lowella Grip III M.D.   On: 02/23/2017 14:41      Assessment & Plan:   77 year old male with metastatic prostate cancer and left ureteral obstruction.  He presents for cystoscopy with left ureteral stent exchange.  The procedure has been discussed in detail.  Preoperative urine culture was negative.  Potential risks of bleeding and infection/sepsis were discussed.  He desires to proceed.  Abbie Sons, [place] [address] [place], [place] [address] [phone]

## 2017-12-17 NOTE — Op Note (Signed)
Preoperative diagnosis:  1.  Left ureteral obstruction secondary to metastatic prostate cancer   Postoperative diagnosis:  1. Left ureteral obstruction secondary to metastatic prostatic cancer  Procedure:  1. Cystoscopy 2. Left ureteral stent exchange 3. Left retrograde pyelography with interpretation   Surgeon: Nicki Reaper C. Stoioff, M.D.  Anesthesia: General  Complications: None  Intraoperative findings: Left retrograde pyelogram shows normal caliber ureter and no significant hydronephrosis.  EBL: None  Specimens: None  Indication: [name] is a 77 y.o. 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  After reviewing the management options for treatment, he elected to proceed with the above surgical procedure(s). We have discussed the potential benefits and risks of the procedure, side effects of the proposed treatment, the likelihood of the patient achieving the goals of the procedure, and any potential problems that might occur during the procedure or recuperation. Informed consent has been obtained.  Description of procedure:  The patient was taken to the operating room and general anesthesia was induced.  The patient was placed in the dorsal lithotomy position, prepped and draped in the usual sterile fashion, and preoperative antibiotics were administered. A preoperative time-out was performed.   A 22 French cystoscope was lubricated and passed per urethra.  The urethra was normal in caliber without stricture.  The prostate was surgically absent.  As the bladder was emptied the ureteral stent was easily visualized.  There were no bladder mucosal lesions noted.  The stent looked remarkably well and no encrustation was noted. The stent was grasped with endoscopic forceps and brought out  through the urethral meatus.    A 0.038 Sensor wire was placed through the stent and advanced to the left renal pelvis under fluoroscopic guidance.  A 6 French open-ended ureteral catheter was placed over the guidewire and the guidewire was removed.  Retrograde pyelogram was performed with findings as described above.  The guidewire was replaced and the ureteral catheter was removed.  A 6 French/26 cm Bard Optima ureteral  stent was then placed without difficulty.  There was good positioning noted proximally and distally.  The bladder was emptied and the cystoscope was removed.  After anesthetic reversal he was transported to PACU in stable condition.  [name], M.D.

## 2017-12-17 NOTE — Anesthesia Preprocedure Evaluation (Signed)
Anesthesia Evaluation  Patient identified by MRN, date of birth, ID band Patient awake    Reviewed: Allergy & Precautions, NPO status , Patient's Chart, lab work & pertinent test results  History of Anesthesia Complications Negative for: history of anesthetic complications  Airway Mallampati: III       Dental   Pulmonary neg sleep apnea, neg COPD, former smoker,           Cardiovascular hypertension, Pt. on medications (-) Past MI (pt denies) and (-) CHF (-) dysrhythmias + Valvular Problems/Murmurs (murmur, no tx)      Neuro/Psych neg Seizures    GI/Hepatic Neg liver ROS, GERD  Medicated and Controlled,  Endo/Other  diabetes, Type 2  Renal/GU Renal disease     Musculoskeletal   Abdominal   Peds  Hematology  (+) anemia ,   Anesthesia Other Findings   Reproductive/Obstetrics                             Anesthesia Physical Anesthesia Plan  ASA: III  Anesthesia Plan: General   Post-op Pain Management:    Induction: Intravenous  PONV Risk Score and Plan: 2 and Dexamethasone and Ondansetron  Airway Management Planned: LMA  Additional Equipment:   Intra-op Plan:   Post-operative Plan:   Informed Consent: I have reviewed the patients History and Physical, chart, labs and discussed the procedure including the risks, benefits and alternatives for the proposed anesthesia with the patient or authorized representative who has indicated his/her understanding and acceptance.     Plan Discussed with:   Anesthesia Plan Comments:         Anesthesia Quick Evaluation

## 2017-12-17 NOTE — Progress Notes (Signed)
Voided   States some burning  No bleeding

## 2017-12-17 NOTE — Anesthesia Postprocedure Evaluation (Signed)
Anesthesia Post Note  Patient: [name]  Procedure(s) Performed: CYSTOSCOPY WITH STENT EXCHANGE (Left Ureter)  Patient location during evaluation: PACU Anesthesia Type: General Level of consciousness: awake and alert Pain management: pain level controlled Vital Signs Assessment: post-procedure vital signs reviewed and stable Respiratory status: spontaneous breathing and respiratory function stable Cardiovascular status: stable Anesthetic complications: no     Last Vitals:  Vitals:   12/17/17 0809 12/17/17 0815  BP:  (!) 143/85  Pulse: 79 76  Resp: 15 11  Temp:    SpO2: 100% 100%    Last Pain:  Vitals:   12/17/17 0815  TempSrc:   PainSc: 0-No pain                 KEPHART,WILLIAM K

## 2017-12-17 NOTE — Progress Notes (Signed)
Blood pressure 160/98  No new orders from dr Ronelle Nigh

## 2017-12-17 NOTE — Anesthesia Procedure Notes (Signed)
Procedure Name: LMA Insertion Date/Time: 12/17/2017 7:23 AM Performed by: Rolla Plate, CRNA Pre-anesthesia Checklist: Patient identified, Emergency Drugs available, Suction available, Patient being monitored and Timeout performed Patient Re-evaluated:Patient Re-evaluated prior to induction Oxygen Delivery Method: Circle system utilized Preoxygenation: Pre-oxygenation with 100% oxygen Induction Type: IV induction Ventilation: Mask ventilation without difficulty LMA: LMA inserted LMA Size: 5.0 Number of attempts: 1 Airway Equipment and Method: Patient positioned with wedge pillow

## 2017-12-17 NOTE — Transfer of Care (Signed)
Immediate Anesthesia Transfer of Care Note  Patient: [name]  Procedure(s) Performed: CYSTOSCOPY WITH STENT EXCHANGE (Left Ureter)  Patient Location: PACU  Anesthesia Type:General  Level of Consciousness: sedated  Airway & Oxygen Therapy: Patient Spontanous Breathing and Patient connected to face mask oxygen  Post-op Assessment: Report given to RN and Post -op Vital signs reviewed and stable  Post vital signs: Reviewed  Last Vitals:  Vitals Value Taken Time  BP 114/73 12/17/2017  7:55 AM  Temp 36.6 C 12/17/2017  7:55 AM  Pulse 74 12/17/2017  7:56 AM  Resp 8 12/17/2017  7:56 AM  SpO2 100 % 12/17/2017  7:56 AM  Vitals shown include unvalidated device data.  Last Pain:  Vitals:   12/17/17 0603  TempSrc: Temporal         Complications: No apparent anesthesia complications

## 2017-12-23 ENCOUNTER — Inpatient Hospital Stay: Payer: Medicare PPO | Attending: Internal Medicine

## 2017-12-23 ENCOUNTER — Inpatient Hospital Stay: Payer: Medicare PPO

## 2017-12-23 ENCOUNTER — Encounter: Payer: Self-pay | Admitting: Internal Medicine

## 2017-12-23 ENCOUNTER — Other Ambulatory Visit: Payer: Self-pay

## 2017-12-23 ENCOUNTER — Inpatient Hospital Stay (HOSPITAL_BASED_OUTPATIENT_CLINIC_OR_DEPARTMENT_OTHER): Payer: Medicare PPO | Admitting: Internal Medicine

## 2017-12-23 VITALS — BP 121/75 | HR 78 | Temp 97.9°F | Resp 20 | Ht 71.0 in | Wt 190.4 lb

## 2017-12-23 DIAGNOSIS — Z923 Personal history of irradiation: Secondary | ICD-10-CM | POA: Insufficient documentation

## 2017-12-23 DIAGNOSIS — Z87891 Personal history of nicotine dependence: Secondary | ICD-10-CM

## 2017-12-23 DIAGNOSIS — Z79899 Other long term (current) drug therapy: Secondary | ICD-10-CM | POA: Insufficient documentation

## 2017-12-23 DIAGNOSIS — I700 Atherosclerosis of aorta: Secondary | ICD-10-CM | POA: Insufficient documentation

## 2017-12-23 DIAGNOSIS — Z79818 Long term (current) use of other agents affecting estrogen receptors and estrogen levels: Secondary | ICD-10-CM | POA: Diagnosis not present

## 2017-12-23 DIAGNOSIS — C61 Malignant neoplasm of prostate: Secondary | ICD-10-CM

## 2017-12-23 DIAGNOSIS — G893 Neoplasm related pain (acute) (chronic): Secondary | ICD-10-CM | POA: Insufficient documentation

## 2017-12-23 DIAGNOSIS — J439 Emphysema, unspecified: Secondary | ICD-10-CM | POA: Insufficient documentation

## 2017-12-23 DIAGNOSIS — Z192 Hormone resistant malignancy status: Secondary | ICD-10-CM | POA: Diagnosis not present

## 2017-12-23 DIAGNOSIS — Z7982 Long term (current) use of aspirin: Secondary | ICD-10-CM

## 2017-12-23 DIAGNOSIS — C7951 Secondary malignant neoplasm of bone: Principal | ICD-10-CM

## 2017-12-23 DIAGNOSIS — N1330 Unspecified hydronephrosis: Secondary | ICD-10-CM | POA: Diagnosis not present

## 2017-12-23 DIAGNOSIS — E119 Type 2 diabetes mellitus without complications: Secondary | ICD-10-CM | POA: Insufficient documentation

## 2017-12-23 DIAGNOSIS — E7800 Pure hypercholesterolemia, unspecified: Secondary | ICD-10-CM | POA: Diagnosis not present

## 2017-12-23 DIAGNOSIS — K219 Gastro-esophageal reflux disease without esophagitis: Secondary | ICD-10-CM | POA: Diagnosis not present

## 2017-12-23 DIAGNOSIS — Z803 Family history of malignant neoplasm of breast: Secondary | ICD-10-CM | POA: Insufficient documentation

## 2017-12-23 DIAGNOSIS — I10 Essential (primary) hypertension: Secondary | ICD-10-CM

## 2017-12-23 LAB — CBC WITH DIFFERENTIAL/PLATELET
BASOS ABS: 0.1 10*3/uL (ref 0–0.1)
Basophils Relative: 1 %
Eosinophils Absolute: 0.3 10*3/uL (ref 0–0.7)
Eosinophils Relative: 4 %
HEMATOCRIT: 40.5 % (ref 40.0–52.0)
Hemoglobin: 14 g/dL (ref 13.0–18.0)
LYMPHS ABS: 1.5 10*3/uL (ref 1.0–3.6)
LYMPHS PCT: 16 %
MCH: 31 pg (ref 26.0–34.0)
MCHC: 34.5 g/dL (ref 32.0–36.0)
MCV: 89.8 fL (ref 80.0–100.0)
Monocytes Absolute: 0.7 10*3/uL (ref 0.2–1.0)
Monocytes Relative: 8 %
NEUTROS ABS: 6.6 10*3/uL — AB (ref 1.4–6.5)
Neutrophils Relative %: 71 %
PLATELETS: 172 10*3/uL (ref 150–440)
RBC: 4.51 MIL/uL (ref 4.40–5.90)
RDW: 15.1 % — ABNORMAL HIGH (ref 11.5–14.5)
WBC: 9.2 10*3/uL (ref 3.8–10.6)

## 2017-12-23 LAB — COMPREHENSIVE METABOLIC PANEL
ALT: 15 U/L — ABNORMAL LOW (ref 17–63)
AST: 26 U/L (ref 15–41)
Albumin: 3.6 g/dL (ref 3.5–5.0)
Alkaline Phosphatase: 56 U/L (ref 38–126)
Anion gap: 10 (ref 5–15)
BILIRUBIN TOTAL: 0.5 mg/dL (ref 0.3–1.2)
BUN: 32 mg/dL — ABNORMAL HIGH (ref 6–20)
CHLORIDE: 101 mmol/L (ref 101–111)
CO2: 25 mmol/L (ref 22–32)
Calcium: 9.6 mg/dL (ref 8.9–10.3)
Creatinine, Ser: 1.16 mg/dL (ref 0.61–1.24)
GFR calc Af Amer: >60 mL/min (ref >60)
GFR, EST NON AFRICAN AMERICAN: 59 mL/min — AB (ref >60)
Glucose, Bld: 160 mg/dL — ABNORMAL HIGH (ref 65–99)
POTASSIUM: 3.5 mmol/L (ref 3.5–5.1)
Sodium: 136 mmol/L (ref 135–145)
TOTAL PROTEIN: 6.8 g/dL (ref 6.5–8.1)

## 2017-12-23 LAB — PSA: PROSTATIC SPECIFIC ANTIGEN: 5.88 ng/mL — AB (ref 0.00–4.00)

## 2017-12-23 MED ORDER — DENOSUMAB 120 MG/1.7ML ~~LOC~~ SOLN
120.0000 mg | Freq: Once | SUBCUTANEOUS | Status: AC
Start: 2017-12-23 — End: 2017-12-23
  Administered 2017-12-23: 120 mg via SUBCUTANEOUS
  Filled 2017-12-23: qty 1.7

## 2017-12-23 MED ORDER — FENTANYL 50 MCG/HR TD PT72: 50.0000 ug | MEDICATED_PATCH | TRANSDERMAL | 0 refills | Status: DC

## 2017-12-23 MED ORDER — HYDROCODONE-ACETAMINOPHEN 5-325 MG PO TABS: 1.0000 | ORAL_TABLET | Freq: Two times a day (BID) | ORAL | 0 refills | Status: DC | PRN

## 2017-12-23 NOTE — Progress Notes (Signed)
Patient here for follow-up. Pain control is stable. He currently denies any pain today.  Pt reports he is off the zytiga. He has been taking the prednisone 5 mg twice daily as he didn't know to stop this medication after d/c the zytiga.

## 2017-12-23 NOTE — Progress Notes (Signed)
[place] OFFICE PROGRESS NOTE  Patient Care Team: Tamsen Roers, MD as PCP - General (Family Medicine)   SUMMARY OF ONCOLOGIC HISTORY:  Oncology History   # AUG 2016- METASTATIC PROSTATE CA/ STAGE IV; Castrate sensitive [PSA- 48] ; mets- lumbar spine [s/p pal RT to Aug 2016; Dr.Crystal] /Pelvic LN; 1994- Prostate CA s/p Surgery [facility]]; Lupron q4 M; MARCH 2017- Bone scan- L4/Right pubic rami uptake; PSA- 0.1/testosterone-castrate [<3]  # Bone lesions on denosumab;DEC 2016- Recm q 29M  # NOV 2018- CASTRATE RESISTANT; declined chemo; 08/06/2017- start Zytiga+prednisone; Stopped April 24th [rising PSA];  # may 2019- Xofigo   # Left hydronephrosis s/p stenting [Dr.Budzyn]     Prostate cancer metastatic to bone [facility])    INTERVAL HISTORY:  77 year old male patient with 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  Patient was taken off Zytiga at last visit [24th April] because of rising PSA.  He has been evaluated by radiation oncology-for Xofigo.  The interim patient also left ureteral stent exchange.  Patient denies any leg swelling.  Denies any headaches.  He continues to have right hip pain; currently stable on fentanyl patch and also taking hydrocodone up to 2 a day.  No diarrhea constipation   Review of Systems  Constitutional: Negative.   HENT: Negative.   Eyes: Negative.   Respiratory: Negative.   Cardiovascular: Negative.   Gastrointestinal: Negative.   Genitourinary: Negative for dysuria, frequency, hematuria and urgency.  Musculoskeletal: Positive for back pain and joint pain.  Skin: Negative.   Neurological: Negative.   Endo/Heme/Allergies: Negative.  Does not bruise/bleed easily.  Psychiatric/Behavioral: Negative.       PAST MEDICAL HISTORY :  Past Medical History:  Diagnosis Date  . Aortic stenosis, moderate 01/30/2014  . Arthritis   . Atrial flutter ([place]) 02/02/2014  . Diabetes  mellitus without complication (HCC)    diet control  . GERD (gastroesophageal reflux disease)   . Heart murmur   . Heart murmur   . History of kidney stones   . HTN (hypertension) 01/30/2014  . Hypercholesteremia   . Hypertension   . Prostate cancer ([place])    metastatic  . Weakness of left leg     PAST SURGICAL HISTORY :   Past Surgical History:  Procedure Laterality Date  . CATARACT EXTRACTION W/ INTRAOCULAR LENS IMPLANT Bilateral   . COLON RESECTION    . CYSTOSCOPY W/ RETROGRADES Left 06/08/2015   Procedure: CYSTOSCOPY WITH RETROGRADE PYELOGRAM;  Surgeon: Nickie Retort, MD;  Location: [facility];  Service: Urology;  Laterality: Left;  . CYSTOSCOPY W/ URETERAL STENT PLACEMENT Left 10/05/2015   Procedure: CYSTOSCOPY WITH STENT REPLACEMENT;  Surgeon: Nickie Retort, MD;  Location: [facility];  Service: Urology;  Laterality: Left;  . CYSTOSCOPY W/ URETERAL STENT PLACEMENT Left 01/04/2016   Procedure: CYSTOSCOPY WITH STENT REPLACEMENT;  Surgeon: Nickie Retort, MD;  Location: [facility];  Service: Urology;  Laterality: Left;  . CYSTOSCOPY W/ URETERAL STENT PLACEMENT Left 04/11/2016   Procedure: CYSTOSCOPY WITH STENT REPLACEMENT;  Surgeon: Nickie Retort, MD;  Location: [facility];  Service: Urology;  Laterality: Left;  . CYSTOSCOPY W/ URETERAL STENT PLACEMENT Left 07/18/2016   Procedure: CYSTOSCOPY WITH STENT REPLACEMENT;  Surgeon: Nickie Retort, MD;  Location: [facility];  Service: Urology;  Laterality: Left;  . CYSTOSCOPY W/ URETERAL STENT PLACEMENT Left 11/02/2016   Procedure: CYSTOSCOPY WITH STENT REPLACEMENT;  Surgeon: Nickie Retort, MD;  Location: [facility];  Service: Urology;  Laterality: Left;  . CYSTOSCOPY W/ URETERAL STENT PLACEMENT Left 02/08/2017   Procedure: CYSTOSCOPY WITH STENT REPLACEMENT;  Surgeon: Nickie Retort, MD;  Location: [facility];  Service: Urology;  Laterality: Left;  . CYSTOSCOPY W/ URETERAL STENT PLACEMENT Left 05/10/2017   Procedure: CYSTOSCOPY  WITH STENT REPLACEMENT;  Surgeon: Nickie Retort, MD;  Location: [facility];  Service: Urology;  Laterality: Left;  . CYSTOSCOPY WITH STENT PLACEMENT Left 08/16/2017   Procedure: CYSTOSCOPY WITH STENT EXCHANGE;  Surgeon: Abbie Sons, MD;  Location: [facility];  Service: Urology;  Laterality: Left;  . CYSTOSCOPY WITH STENT PLACEMENT Left 12/17/2017   Procedure: CYSTOSCOPY WITH STENT EXCHANGE;  Surgeon: Abbie Sons, MD;  Location: [facility];  Service: Urology;  Laterality: Left;  . EYE SURGERY Bilateral Sept and Oct.2012   cataract  . HERNIA REPAIR    . PROSTATECTOMY  1994    FAMILY HISTORY :   Family History  Problem Relation Age of Onset  . Aneurysm Father   . Cancer Mother        breast    SOCIAL HISTORY:   Social History   Tobacco Use  . Smoking status: Former Smoker    Packs/day: 1.50    Years: 25.00    Pack years: 37.50    Types: Cigarettes    Last attempt to quit: 01/21/1974    Years since quitting: 43.9  . Smokeless tobacco: Never Used  Substance Use Topics  . Alcohol use: No  . Drug use: No    ALLERGIES:  has No Known Allergies.  MEDICATIONS:  Current Outpatient Medications  Medication Sig Dispense Refill  . allopurinol (ZYLOPRIM) 300 MG tablet Take 300 mg by mouth daily.     . Ascorbic Acid (VITAMIN C) 100 MG tablet Take 500 mg by mouth daily.    Marland Kitchen aspirin 81 MG tablet Take 81 mg by mouth daily.     Marland Kitchen atorvastatin (LIPITOR) 10 MG tablet Take 10 mg by mouth daily at 6 PM.     . carvedilol (COREG) 6.25 MG tablet Take 1 tablet (6.25 mg total) by mouth 2 (two) times daily with a meal. 60 tablet 2  . chlorthalidone (HYGROTON) 25 MG tablet Take 25 mg by mouth daily.     . fentaNYL (DURAGESIC - DOSED MCG/HR) 50 MCG/HR Place 1 patch (50 mcg total) onto the skin every 3 (three) days. 10 patch 0  . ferrous sulfate 325 (65 FE) MG tablet Take 325 mg by mouth daily with breakfast.     . HYDROcodone-acetaminophen (NORCO/VICODIN) 5-325 MG tablet Take 1 tablet by mouth  every 12 (twelve) hours as needed. 60 tablet 0  . Loratadine-Pseudoephedrine (ALLERGY RELIEF D PO) Take 1 tablet by mouth daily as needed (seasonal allergy).    Marland Kitchen oxybutynin (DITROPAN) 5 MG tablet Take 5 mg by mouth 4 (four) times daily as needed for bladder spasms.     . pantoprazole (PROTONIX) 40 MG tablet Take 1 tablet (40 mg total) by mouth daily before breakfast. 30 tablet 2  . ranitidine (ZANTAC) 150 MG tablet Take 150 mg by mouth 2 (two) times daily.     . vitamin B-12 (CYANOCOBALAMIN) 1000 MCG tablet Take 1,000 mcg by mouth daily.    . Vitamin D, Ergocalciferol, (DRISDOL) 50000 units CAPS capsule Take 50,000 Units by mouth every Tuesday.      No current facility-administered medications for this visit.     PHYSICAL EXAMINATION: ECOG PERFORMANCE STATUS: 0 - Asymptomatic  BP 121/75 (  BP Location: Left Arm, Patient Position: Sitting)   Pulse 78   Temp 97.9 F (36.6 C) (Tympanic)   Resp 20   Ht 5\' 11"  (1.803 m)   Wt 190 lb 6.4 oz (86.4 kg)   BMI 26.56 kg/m   Filed Weights   12/23/17 1016  Weight: 190 lb 6.4 oz (86.4 kg)    GENERAL: Well-nourished well-developed; Alert, no distress and comfortable.  Accompanied by family.  Patient is in a wheelchair. EYES: no pallor or icterus OROPHARYNX: no thrush or ulceration; NECK: supple; no lymph nodes felt. LYMPH:  no palpable lymphadenopathy in the axillary or inguinal regions LUNGS: Decreased breath sounds auscultation bilaterally. No wheeze or crackles HEART/CVS: regular rate & rhythm and no murmurs; No lower extremity edema ABDOMEN:abdomen soft, non-tender and normal bowel sounds. No hepatomegaly or splenomegaly.  Musculoskeletal:no cyanosis of digits and no clubbing  PSYCH: alert & oriented x 3 with fluent speech NEURO: no focal motor/sensory deficits SKIN:  no rashes or significant lesions   LABORATORY DATA:  I have reviewed the data as listed    Component Value Date/Time   NA 136 12/23/2017 0945   K 3.5 12/23/2017 0945    CL 101 12/23/2017 0945   CO2 25 12/23/2017 0945   GLUCOSE 160 (H) 12/23/2017 0945   BUN 32 (H) 12/23/2017 0945   CREATININE 1.16 12/23/2017 0945   CALCIUM 9.6 12/23/2017 0945   PROT 6.8 12/23/2017 0945   ALBUMIN 3.6 12/23/2017 0945   AST 26 12/23/2017 0945   ALT 15 (L) 12/23/2017 0945   ALKPHOS 56 12/23/2017 0945   BILITOT 0.5 12/23/2017 0945   GFRNONAA 59 (L) 12/23/2017 0945   GFRAA >60 12/23/2017 0945    No results found for: SPEP, UPEP  Lab Results  Component Value Date   WBC 9.2 12/23/2017   NEUTROABS 6.6 (H) 12/23/2017   HGB 14.0 12/23/2017   HCT 40.5 12/23/2017   MCV 89.8 12/23/2017   PLT 172 12/23/2017      Chemistry      Component Value Date/Time   NA 136 12/23/2017 0945   K 3.5 12/23/2017 0945   CL 101 12/23/2017 0945   CO2 25 12/23/2017 0945   BUN 32 (H) 12/23/2017 0945   CREATININE 1.16 12/23/2017 0945      Component Value Date/Time   CALCIUM 9.6 12/23/2017 0945   ALKPHOS 56 12/23/2017 0945   AST 26 12/23/2017 0945   ALT 15 (L) 12/23/2017 0945   BILITOT 0.5 12/23/2017 0945     Results for KHYLIN, GUTRIDGE (MRN [id number]) as of 03/11/2017 13:41  Ref. Range 11/21/2015 13:31 03/07/2016 14:55 06/28/2016 13:40 10/19/2016 13:35 02/25/2017 11:10  PSA Latest Ref Range: 0.00 - 4.00 ng/mL 0.12 0.06 0.02 0.10   Prostatic Specific Antigen Latest Ref Range: 0.00 - 4.00 ng/mL     0.39    IMPRESSION: Areas of bony metastatic disease in the lumbar spine, probable lower thoracic spine, and right ischium. There appears to be an increase in abnormal uptake in the lower thoracic spinous process regions at T9 and T10 as well as in portions of the L3 and L5 vertebrae compared the prior study. Radiographic correlation advised to confirm progression of bony metastatic disease in these areas. The abnormal radiotracer uptake at L4 and in the right ischium are stable. Old rib fractures show stable uptake bilaterally. Multiple areas of arthropathy noted. Fullness of the left renal  collecting system has been documented previously and accounts for the decreased uptake in the left kidney  compared to the right, a stable finding.  Electronically Signed   By: Lowella Grip III M.D.   On: 03/08/2017 13:01   IMPRESSION: 1. No acute intrathoracic pathology. No CT evidence of pulmonary embolus. 2. Mild emphysema. 3. **An incidental finding of potential clinical significance has been found. Right thyroid hypodense nodule. Further evaluation with ultrasound on a nonemergent basis recommended.** 4. Aortic Atherosclerosis (ICD10-I70.0) and Emphysema (ICD10-J43.9).  Electronically Signed   By: Anner Crete M.D.   On: 06/11/2017 22:10  IMPRESSION: 1. New radiotracer avid skeletal metastasis in the T9 vertebral body without associated CT findings. 2. Recurrence of prostate carcinoma bone metastasis at pathologic RIGHT rib fracture with intense radiotracer activity and new cortical erosion and expansion. 3. No evidence of metastatic adenopathy in the pelvis or abdomen. 4. No evidence local recurrence the prostate bed. 5. No radiotracer activity associated sclerotic lesions with L4 vertebral body. In   Electronically Signed   By: Suzy Bouchard M.D.   On: 08/08/2017 15:07   IMPRESSION: Stable sclerotic right inferior pubic ramus metastasis. No additional bony abnormality.  Electronically Signed   By: Rolm Baptise M.D.   On: 06/12/2017 19:31    IMPRESSION: 1. 16 mm T8 metastasis without pathologic fracture. Expansile RIGHT posterior sixth rib metastasis. 2. Mild degenerative change of the spine, less than expected for age. No canal stenosis or neural foraminal narrowing.   Electronically Signed   By: Elon Alas M.D.   On: 06/27/2017 16:58  Results for DONATO, STUDLEY (MRN [id number]) as of 11/26/2017 17:35  Ref. Range 08/30/2017 09:40 09/27/2017 10:09 09/27/2017 10:25 10/28/2017 09:48 11/25/2017 10:00  CEA Latest Ref Range: 0.0 - 4.7 ng/mL    3.4    Prostatic Specific Antigen Latest Ref Range: 0.00 - 4.00 ng/mL 1.73 3.13  3.50 6.03 (H)     ASSESSMENT & PLAN:   Prostate cancer metastatic to bone [facility]) #Metastatic castrate resistant prostate cancer-Lupron and Xgeva. DEC  Aux PET- right post rib uptake [not symptomatic]; left actebular uptake [symptomatic]; [PSA not a good marker]; given the progression on Zytiga plus prednisone; patient is currently off Zytiga plus prednisone.  #Plan to start [facility] pending insurance approval.  #Left hip pain improved status post radiation; continue fentanyl/Percocet twice a day.  New prescription called in.  # Bone mets-Xgeva q. Monthly.  Tolerating it without any side effects.  # Left ureteral stent/hydronephrosis- s/p stent exchange  # follow up in 1 months/labs- PSA; x-geva; July [plan Lupron].    Orders Placed This Encounter  Procedures  . CBC with Differential/Platelet    Standing Status:   Future    Standing Expiration Date:   12/24/2018  . Comprehensive metabolic panel    Standing Status:   Future    Standing Expiration Date:   12/24/2018  . PSA    Standing Status:   Future    Standing Expiration Date:   12/24/2018     Cammie Sickle, MD 12/23/2017 12:57 PM

## 2017-12-23 NOTE — Assessment & Plan Note (Addendum)
#  Metastatic castrate resistant prostate cancer-Lupron and Xgeva. DEC  Aux PET- right post rib uptake [not symptomatic]; left actebular uptake [symptomatic]; [PSA not a good marker]; given the progression on Zytiga plus prednisone; patient is currently off Zytiga plus prednisone.  #Plan to start [facility] pending insurance approval.  #Left hip pain improved status post radiation; continue fentanyl/Percocet twice a day.  New prescription called in.  # Bone mets-Xgeva q. Monthly.  Tolerating it without any side effects.  # Left ureteral stent/hydronephrosis- s/p stent exchange  # follow up in 1 months/labs- PSA; x-geva; July [plan Lupron].

## 2018-01-03 ENCOUNTER — Other Ambulatory Visit: Payer: Self-pay | Admitting: *Deleted

## 2018-01-03 ENCOUNTER — Other Ambulatory Visit: Payer: Self-pay | Admitting: Radiation Oncology

## 2018-01-03 DIAGNOSIS — C61 Malignant neoplasm of prostate: Secondary | ICD-10-CM

## 2018-01-03 DIAGNOSIS — C7951 Secondary malignant neoplasm of bone: Principal | ICD-10-CM

## 2018-01-07 ENCOUNTER — Inpatient Hospital Stay: Payer: Medicare PPO

## 2018-01-07 ENCOUNTER — Encounter: Payer: Self-pay | Admitting: *Deleted

## 2018-01-07 DIAGNOSIS — C7951 Secondary malignant neoplasm of bone: Secondary | ICD-10-CM

## 2018-01-07 LAB — CBC WITH DIFFERENTIAL/PLATELET
Basophils Absolute: 0.1 10*3/uL (ref 0–0.1)
Basophils Relative: 2 %
Eosinophils Absolute: 0.4 10*3/uL (ref 0–0.7)
Eosinophils Relative: 5 %
HEMATOCRIT: 36.8 % — AB (ref 40.0–52.0)
HEMOGLOBIN: 12.7 g/dL — AB (ref 13.0–18.0)
LYMPHS PCT: 15 %
Lymphs Abs: 1.2 10*3/uL (ref 1.0–3.6)
MCH: 30.8 pg (ref 26.0–34.0)
MCHC: 34.5 g/dL (ref 32.0–36.0)
MCV: 89.3 fL (ref 80.0–100.0)
MONO ABS: 0.7 10*3/uL (ref 0.2–1.0)
MONOS PCT: 9 %
NEUTROS ABS: 5.4 10*3/uL (ref 1.4–6.5)
NEUTROS PCT: 69 %
Platelets: 235 10*3/uL (ref 150–440)
RBC: 4.12 MIL/uL — ABNORMAL LOW (ref 4.40–5.90)
RDW: 14.6 % — AB (ref 11.5–14.5)
WBC: 7.8 10*3/uL (ref 3.8–10.6)

## 2018-01-09 ENCOUNTER — Other Ambulatory Visit: Payer: Medicare PPO

## 2018-01-15 ENCOUNTER — Ambulatory Visit: Payer: Medicare PPO | Admitting: Radiation Oncology

## 2018-01-15 ENCOUNTER — Ambulatory Visit
Admission: RE | Admit: 2018-01-15 | Discharge: 2018-01-15 | Disposition: A | Discharge status: Home / Self Care | Payer: Medicare PPO | Source: Ambulatory Visit | Attending: Radiation Oncology | Admitting: Radiation Oncology

## 2018-01-15 DIAGNOSIS — C61 Malignant neoplasm of prostate: Secondary | ICD-10-CM | POA: Diagnosis present

## 2018-01-15 DIAGNOSIS — M25551 Pain in right hip: Secondary | ICD-10-CM | POA: Diagnosis not present

## 2018-01-15 DIAGNOSIS — C7951 Secondary malignant neoplasm of bone: Secondary | ICD-10-CM | POA: Diagnosis present

## 2018-01-15 MED ORDER — RADIUM RA 223 DICHLORIDE 30 MCCI/ML IV SOLN
133.7270 | Freq: Once | INTRAVENOUS | Status: AC
  Administered 2018-01-15: 133.727 via INTRAVENOUS

## 2018-01-16 NOTE — Progress Notes (Signed)
Radiation Oncology Xofigo Note  Name: [name]   Date:   01/15/2018 MRN:  [id number] DOB: 03-05-1941    [name] presents to the  Nuclear med department today for first Xofigo injection.  REFERRING PROVIDER: Noreene Filbert, MD  HPI: patient is a 77 year old male who has been treated in the past with external beam radiation therapy for stage IV castrate resistant prostate cancer.He is now starting [place]. He is doing well continues have pain in right hip..  COMPLICATIONS OF TREATMENT: none  FOLLOW UP COMPLIANCE: keeps appointments   PHYSICAL EXAM:  There were no vitals taken for this visit. Well-developed well-nourished patient in NAD. HEENT reveals PERLA, EOMI, discs not visualized.  Oral cavity is clear. No oral mucosal lesions are identified. Neck is clear without evidence of cervical or supraclavicular adenopathy. Lungs are clear to A&P. Cardiac examination is essentially unremarkable with regular rate and rhythm without murmur rub or thrill. Abdomen is benign with no organomegaly or masses noted. Motor sensory and DTR levels are equal and symmetric in the upper and lower extremities. Cranial nerves II through XII are grossly intact. Proprioception is intact. No peripheral adenopathy or edema is identified. No motor or sensory levels are noted. Crude visual fields are within normal range.  RADIOLOGY RESULTS: no current films for review  PLAN: Peripheral line was started on the patient and 20 cc of normal saline were passed to make sure there was patency of the lines. [name] was administered over a 5 minute infusion push by nuclear medicine technologist supervised by radiation oncologist. After completion of IV push of Xofigo 30 cc an additional saline were passed through the peripheral line. Oral lines syringes drapes and original container of Xofigo were then taken to nuclear medicine for storage. Patient tolerated the procedure well without side effect or complaint. Patient  has anti-emetic medication. Have scheduled the patient for a three-week followup to check on his counts. Patient is to call sooner with any side effects or complaints.    Timothy Filbert, MD

## 2018-01-20 ENCOUNTER — Inpatient Hospital Stay (HOSPITAL_BASED_OUTPATIENT_CLINIC_OR_DEPARTMENT_OTHER): Payer: Medicare PPO | Admitting: Internal Medicine

## 2018-01-20 ENCOUNTER — Other Ambulatory Visit: Payer: Self-pay

## 2018-01-20 ENCOUNTER — Inpatient Hospital Stay: Payer: Medicare PPO | Attending: Internal Medicine

## 2018-01-20 ENCOUNTER — Inpatient Hospital Stay: Payer: Medicare PPO

## 2018-01-20 ENCOUNTER — Encounter: Payer: Self-pay | Admitting: Internal Medicine

## 2018-01-20 VITALS — BP 118/71 | HR 84 | Temp 97.5°F | Resp 16 | Ht 71.0 in | Wt 192.0 lb

## 2018-01-20 DIAGNOSIS — C61 Malignant neoplasm of prostate: Secondary | ICD-10-CM

## 2018-01-20 DIAGNOSIS — C7951 Secondary malignant neoplasm of bone: Secondary | ICD-10-CM

## 2018-01-20 DIAGNOSIS — I350 Nonrheumatic aortic (valve) stenosis: Secondary | ICD-10-CM | POA: Insufficient documentation

## 2018-01-20 DIAGNOSIS — I10 Essential (primary) hypertension: Secondary | ICD-10-CM

## 2018-01-20 DIAGNOSIS — K219 Gastro-esophageal reflux disease without esophagitis: Secondary | ICD-10-CM | POA: Diagnosis not present

## 2018-01-20 DIAGNOSIS — Z79899 Other long term (current) drug therapy: Secondary | ICD-10-CM | POA: Insufficient documentation

## 2018-01-20 DIAGNOSIS — E7800 Pure hypercholesterolemia, unspecified: Secondary | ICD-10-CM | POA: Diagnosis not present

## 2018-01-20 DIAGNOSIS — Z192 Hormone resistant malignancy status: Secondary | ICD-10-CM

## 2018-01-20 DIAGNOSIS — Z87442 Personal history of urinary calculi: Secondary | ICD-10-CM | POA: Insufficient documentation

## 2018-01-20 DIAGNOSIS — R5383 Other fatigue: Secondary | ICD-10-CM

## 2018-01-20 DIAGNOSIS — R011 Cardiac murmur, unspecified: Secondary | ICD-10-CM | POA: Insufficient documentation

## 2018-01-20 DIAGNOSIS — Z87891 Personal history of nicotine dependence: Secondary | ICD-10-CM | POA: Insufficient documentation

## 2018-01-20 DIAGNOSIS — R531 Weakness: Secondary | ICD-10-CM | POA: Insufficient documentation

## 2018-01-20 DIAGNOSIS — E118 Type 2 diabetes mellitus with unspecified complications: Secondary | ICD-10-CM | POA: Insufficient documentation

## 2018-01-20 DIAGNOSIS — I4892 Unspecified atrial flutter: Secondary | ICD-10-CM

## 2018-01-20 LAB — CBC WITH DIFFERENTIAL/PLATELET
BASOS ABS: 0.1 10*3/uL (ref 0–0.1)
BASOS PCT: 2 %
EOS PCT: 5 %
Eosinophils Absolute: 0.4 10*3/uL (ref 0–0.7)
HEMATOCRIT: 37.7 % — AB (ref 40.0–52.0)
Hemoglobin: 12.8 g/dL — ABNORMAL LOW (ref 13.0–18.0)
LYMPHS PCT: 14 %
Lymphs Abs: 1 10*3/uL (ref 1.0–3.6)
MCH: 30.9 pg (ref 26.0–34.0)
MCHC: 34 g/dL (ref 32.0–36.0)
MCV: 90.9 fL (ref 80.0–100.0)
MONO ABS: 0.6 10*3/uL (ref 0.2–1.0)
Monocytes Relative: 9 %
NEUTROS ABS: 5.1 10*3/uL (ref 1.4–6.5)
Neutrophils Relative %: 70 %
PLATELETS: 206 10*3/uL (ref 150–440)
RBC: 4.15 MIL/uL — AB (ref 4.40–5.90)
RDW: 15.1 % — AB (ref 11.5–14.5)
WBC: 7.2 10*3/uL (ref 3.8–10.6)

## 2018-01-20 LAB — COMPREHENSIVE METABOLIC PANEL
ALBUMIN: 3.6 g/dL (ref 3.5–5.0)
ALT: 10 U/L — AB (ref 17–63)
AST: 27 U/L (ref 15–41)
Alkaline Phosphatase: 61 U/L (ref 38–126)
Anion gap: 12 (ref 5–15)
BUN: 24 mg/dL — AB (ref 6–20)
CO2: 24 mmol/L (ref 22–32)
CREATININE: 1.24 mg/dL (ref 0.61–1.24)
Calcium: 9.9 mg/dL (ref 8.9–10.3)
Chloride: 102 mmol/L (ref 101–111)
GFR calc Af Amer: >60 mL/min (ref >60)
GFR calc non Af Amer: 55 mL/min — ABNORMAL LOW (ref >60)
GLUCOSE: 190 mg/dL — AB (ref 65–99)
Potassium: 3.8 mmol/L (ref 3.5–5.1)
Sodium: 138 mmol/L (ref 135–145)
Total Bilirubin: 0.5 mg/dL (ref 0.3–1.2)
Total Protein: 6.9 g/dL (ref 6.5–8.1)

## 2018-01-20 LAB — PSA: Prostatic Specific Antigen: 5.69 ng/mL — ABNORMAL HIGH (ref 0.00–4.00)

## 2018-01-20 MED ORDER — FENTANYL 50 MCG/HR TD PT72: 50.0000 ug | MEDICATED_PATCH | TRANSDERMAL | 0 refills | Status: DC

## 2018-01-20 MED ORDER — HYDROCODONE-ACETAMINOPHEN 5-325 MG PO TABS: 1.0000 | ORAL_TABLET | Freq: Two times a day (BID) | ORAL | 0 refills | Status: DC | PRN

## 2018-01-20 MED ORDER — DENOSUMAB 120 MG/1.7ML ~~LOC~~ SOLN
120.0000 mg | Freq: Once | SUBCUTANEOUS | Status: AC
  Administered 2018-01-20: 120 mg via SUBCUTANEOUS
  Filled 2018-01-20: qty 1.7

## 2018-01-20 NOTE — Progress Notes (Signed)
[facility] OFFICE PROGRESS NOTE  Patient Care Team: Tamsen Roers, MD as PCP - General (Family Medicine)  Cancer Staging No matching staging information was found for the patient.   Oncology History   # AUG 2016- METASTATIC PROSTATE CA/ STAGE IV; Castrate sensitive [PSA- 48] ; mets- lumbar spine [s/p pal RT to Aug 2016; Dr.Crystal] /Pelvic LN; 1994- Prostate CA s/p Surgery [facility]]; Lupron q4 M; MARCH 2017- Bone scan- L4/Right pubic rami uptake; PSA- 0.1/testosterone-castrate [<3]  # Bone lesions on denosumab;DEC 2016- Recm q 54M  # NOV 2018- CASTRATE RESISTANT; declined chemo; 08/06/2017- start Zytiga+prednisone; Stopped April 24th [rising PSA];  # may 2019- Xofigo   # Left hydronephrosis s/p stenting [Dr.Budzyn] --------------------------------------------   DIAGNOSIS: [ ]  castrate resistant prostate cancer  STAGE:  IV       ;GOALS: palliative  CURRENT/MOST RECENT THERAPY [ 5/29/2019[name]      Prostate cancer metastatic to bone [facility])      INTERVAL HISTORY:  [name] 77 y.o.  male pleasant patient above history of castrate resistant prostate cancer currently on Xofigo is here for follow-up.  Patient states that #1 of Xofigo on May 29.  Patient continues to complain of mild to moderate pain in the right hip.  Otherwise denies any bladder or bowel incontinence.  Appetite is fair.  Review of Systems  Constitutional: Positive for malaise/fatigue. Negative for chills, diaphoresis, fever and weight loss.  HENT: Negative for nosebleeds and sore throat.   Eyes: Negative for double vision.  Respiratory: Negative for cough, hemoptysis, sputum production, shortness of breath and wheezing.   Cardiovascular: Negative for chest pain, palpitations, orthopnea and leg swelling.  Gastrointestinal: Negative for abdominal pain, blood in stool, constipation, diarrhea, heartburn, melena, nausea and vomiting.  Genitourinary: Negative for dysuria, frequency and  urgency.  Musculoskeletal: Positive for joint pain (Right hip pain). Negative for back pain.  Skin: Negative.  Negative for itching and rash.  Neurological: Negative for dizziness, tingling, focal weakness, weakness and headaches.  Endo/Heme/Allergies: Does not bruise/bleed easily.  Psychiatric/Behavioral: Negative for depression. The patient is not nervous/anxious and does not have insomnia.       PAST MEDICAL HISTORY :  Past Medical History:  Diagnosis Date  . Aortic stenosis, moderate 01/30/2014  . Arthritis   . Atrial flutter ([place]) 02/02/2014  . Diabetes mellitus without complication (HCC)    diet control  . GERD (gastroesophageal reflux disease)   . Heart murmur   . Heart murmur   . History of kidney stones   . HTN (hypertension) 01/30/2014  . Hypercholesteremia   . Hypertension   . Prostate cancer ([place])    metastatic  . Weakness of left leg     PAST SURGICAL HISTORY :   Past Surgical History:  Procedure Laterality Date  . CATARACT EXTRACTION W/ INTRAOCULAR LENS IMPLANT Bilateral   . COLON RESECTION    . CYSTOSCOPY W/ RETROGRADES Left 06/08/2015   Procedure: CYSTOSCOPY WITH RETROGRADE PYELOGRAM;  Surgeon: Nickie Retort, MD;  Location: [facility];  Service: Urology;  Laterality: Left;  . CYSTOSCOPY W/ URETERAL STENT PLACEMENT Left 10/05/2015   Procedure: CYSTOSCOPY WITH STENT REPLACEMENT;  Surgeon: Nickie Retort, MD;  Location: [facility];  Service: Urology;  Laterality: Left;  . CYSTOSCOPY W/ URETERAL STENT PLACEMENT Left 01/04/2016   Procedure: CYSTOSCOPY WITH STENT REPLACEMENT;  Surgeon: Nickie Retort, MD;  Location: [facility];  Service: Urology;  Laterality: Left;  . CYSTOSCOPY W/ URETERAL STENT PLACEMENT Left 04/11/2016  Procedure: CYSTOSCOPY WITH STENT REPLACEMENT;  Surgeon: Nickie Retort, MD;  Location: [facility];  Service: Urology;  Laterality: Left;  . CYSTOSCOPY W/ URETERAL STENT PLACEMENT Left 07/18/2016   Procedure: CYSTOSCOPY WITH STENT  REPLACEMENT;  Surgeon: Nickie Retort, MD;  Location: [facility];  Service: Urology;  Laterality: Left;  . CYSTOSCOPY W/ URETERAL STENT PLACEMENT Left 11/02/2016   Procedure: CYSTOSCOPY WITH STENT REPLACEMENT;  Surgeon: Nickie Retort, MD;  Location: [facility];  Service: Urology;  Laterality: Left;  . CYSTOSCOPY W/ URETERAL STENT PLACEMENT Left 02/08/2017   Procedure: CYSTOSCOPY WITH STENT REPLACEMENT;  Surgeon: Nickie Retort, MD;  Location: [facility];  Service: Urology;  Laterality: Left;  . CYSTOSCOPY W/ URETERAL STENT PLACEMENT Left 05/10/2017   Procedure: CYSTOSCOPY WITH STENT REPLACEMENT;  Surgeon: Nickie Retort, MD;  Location: [facility];  Service: Urology;  Laterality: Left;  . CYSTOSCOPY WITH STENT PLACEMENT Left 08/16/2017   Procedure: CYSTOSCOPY WITH STENT EXCHANGE;  Surgeon: Abbie Sons, MD;  Location: [facility];  Service: Urology;  Laterality: Left;  . CYSTOSCOPY WITH STENT PLACEMENT Left 12/17/2017   Procedure: CYSTOSCOPY WITH STENT EXCHANGE;  Surgeon: Abbie Sons, MD;  Location: [facility];  Service: Urology;  Laterality: Left;  . EYE SURGERY Bilateral Sept and Oct.2012   cataract  . HERNIA REPAIR    . PROSTATECTOMY  1994    FAMILY HISTORY :   Family History  Problem Relation Age of Onset  . Aneurysm Father   . Cancer Mother        breast    SOCIAL HISTORY:   Social History   Tobacco Use  . Smoking status: Former Smoker    Packs/day: 1.50    Years: 25.00    Pack years: 37.50    Types: Cigarettes    Last attempt to quit: 01/21/1974    Years since quitting: 44.0  . Smokeless tobacco: Never Used  Substance Use Topics  . Alcohol use: No  . Drug use: No    ALLERGIES:  has No Known Allergies.  MEDICATIONS:  Current Outpatient Medications  Medication Sig Dispense Refill  . allopurinol (ZYLOPRIM) 300 MG tablet Take 300 mg by mouth daily.     . Ascorbic Acid (VITAMIN C) 100 MG tablet Take 500 mg by mouth daily.    Marland Kitchen aspirin 81 MG tablet Take 81 mg  by mouth daily.     Marland Kitchen atorvastatin (LIPITOR) 10 MG tablet Take 10 mg by mouth daily at 6 PM.     . carvedilol (COREG) 6.25 MG tablet Take 1 tablet (6.25 mg total) by mouth 2 (two) times daily with a meal. 60 tablet 2  . chlorthalidone (HYGROTON) 25 MG tablet Take 25 mg by mouth daily.     . fentaNYL (DURAGESIC - DOSED MCG/HR) 50 MCG/HR Place 1 patch (50 mcg total) onto the skin every 3 (three) days. 10 patch 0  . ferrous sulfate 325 (65 FE) MG tablet Take 325 mg by mouth daily with breakfast.     . HYDROcodone-acetaminophen (NORCO/VICODIN) 5-325 MG tablet Take 1 tablet by mouth every 12 (twelve) hours as needed. 60 tablet 0  . Loratadine-Pseudoephedrine (ALLERGY RELIEF D PO) Take 1 tablet by mouth daily as needed (seasonal allergy).    Marland Kitchen oxybutynin (DITROPAN) 5 MG tablet Take 5 mg by mouth 4 (four) times daily as needed for bladder spasms.     . pantoprazole (PROTONIX) 40 MG tablet Take 1 tablet (40 mg total) by mouth daily before breakfast. 30 tablet  2  . ranitidine (ZANTAC) 150 MG tablet Take 150 mg by mouth 2 (two) times daily.     . vitamin B-12 (CYANOCOBALAMIN) 1000 MCG tablet Take 1,000 mcg by mouth daily.    . Vitamin D, Ergocalciferol, (DRISDOL) 50000 units CAPS capsule Take 50,000 Units by mouth every Tuesday.      No current facility-administered medications for this visit.     PHYSICAL EXAMINATION: ECOG PERFORMANCE STATUS: 2 - Symptomatic, <50% confined to bed  BP 118/71 (BP Location: Left Arm, Patient Position: Sitting)   Pulse 84   Temp (!) 97.5 F (36.4 C) (Tympanic)   Resp 16   Ht 5\' 11"  (1.803 m)   Wt 192 lb (87.1 kg)   BMI 26.78 kg/m   Filed Weights   01/20/18 1005  Weight: 192 lb (87.1 kg)    GENERAL: Well-nourished well-developed; Alert, no distress and comfortable.  Accompanied by his wife.  He is in a wheelchair. EYES: no pallor or icterus OROPHARYNX: no thrush or ulceration; NECK: supple; no lymph nodes felt. LYMPH:  no palpable lymphadenopathy in the  axillary or inguinal regions LUNGS: Decreased breath sounds auscultation bilaterally. No wheeze or crackles HEART/CVS: regular rate & rhythm and no murmurs; No lower extremity edema ABDOMEN:abdomen soft, non-tender and normal bowel sounds. No hepatomegaly or splenomegaly.  Musculoskeletal:no cyanosis of digits and no clubbing  PSYCH: alert & oriented x 3 with fluent speech NEURO: no focal motor/sensory deficits SKIN:  no rashes or significant lesions    LABORATORY DATA:  I have reviewed the data as listed    Component Value Date/Time   NA 138 01/20/2018 0945   K 3.8 01/20/2018 0945   CL 102 01/20/2018 0945   CO2 24 01/20/2018 0945   GLUCOSE 190 (H) 01/20/2018 0945   BUN 24 (H) 01/20/2018 0945   CREATININE 1.24 01/20/2018 0945   CALCIUM 9.9 01/20/2018 0945   PROT 6.9 01/20/2018 0945   ALBUMIN 3.6 01/20/2018 0945   AST 27 01/20/2018 0945   ALT 10 (L) 01/20/2018 0945   ALKPHOS 61 01/20/2018 0945   BILITOT 0.5 01/20/2018 0945   GFRNONAA 55 (L) 01/20/2018 0945   GFRAA >60 01/20/2018 0945    No results found for: SPEP, UPEP  Lab Results  Component Value Date   WBC 7.2 01/20/2018   NEUTROABS 5.1 01/20/2018   HGB 12.8 (L) 01/20/2018   HCT 37.7 (L) 01/20/2018   MCV 90.9 01/20/2018   PLT 206 01/20/2018      Chemistry      Component Value Date/Time   NA 138 01/20/2018 0945   K 3.8 01/20/2018 0945   CL 102 01/20/2018 0945   CO2 24 01/20/2018 0945   BUN 24 (H) 01/20/2018 0945   CREATININE 1.24 01/20/2018 0945      Component Value Date/Time   CALCIUM 9.9 01/20/2018 0945   ALKPHOS 61 01/20/2018 0945   AST 27 01/20/2018 0945   ALT 10 (L) 01/20/2018 0945   BILITOT 0.5 01/20/2018 0945       RADIOGRAPHIC STUDIES: I have personally reviewed the radiological images as listed and agreed with the findings in the report. No results found.   ASSESSMENT & PLAN:  Prostate cancer metastatic to bone [facility]) #Metastatic castrate resistant prostate cancer-Lupron and Xgeva.   Stable.  # Currently on Xofigo status post cycle #1 on May 29th.  Tolerating fairly well.  Discussed that PSA is not good a marker.  # Right pain improved status post radiation; continue fentanyl/hydorcdonet twice a day.  STABLE.   # Bone mets-Xgeva q. Monthly.  Stable.  Continue Xgeva.  #Status post left ureteral stenting; stable.  # follow up in 1 months/labs- PSA; x-geva; July Lupron.   Orders Placed This Encounter  Procedures  . CBC with Differential    Standing Status:   Future    Standing Expiration Date:   01/21/2019  . Comprehensive metabolic panel    Standing Status:   Future    Standing Expiration Date:   01/21/2019  . PSA    Standing Status:   Future    Standing Expiration Date:   01/21/2019   All questions were answered. The patient knows to call the clinic with any problems, questions or concerns.      Cammie Sickle, MD 01/20/2018 11:32 PM

## 2018-01-20 NOTE — Progress Notes (Signed)
Patient here for follow up. Reports right hip pain at a level 5 today.

## 2018-01-20 NOTE — Assessment & Plan Note (Addendum)
#  Metastatic castrate resistant prostate cancer-Lupron and Xgeva.  Stable.  # Currently on Xofigo status post cycle #1 on May 29th.  Tolerating fairly well.  Discussed that PSA is not good a marker.  # Right pain improved status post radiation; continue fentanyl/hydorcdonet twice a day.  STABLE.   # Bone mets-Xgeva q. Monthly.  Stable.  Continue Xgeva.  #Status post left ureteral stenting; stable.  # follow up in 1 months/labs- PSA; x-geva; July Lupron.

## 2018-02-06 ENCOUNTER — Encounter: Payer: Self-pay | Admitting: *Deleted

## 2018-02-06 ENCOUNTER — Inpatient Hospital Stay: Payer: Medicare PPO

## 2018-02-06 DIAGNOSIS — C7951 Secondary malignant neoplasm of bone: Secondary | ICD-10-CM

## 2018-02-06 DIAGNOSIS — C61 Malignant neoplasm of prostate: Secondary | ICD-10-CM | POA: Diagnosis not present

## 2018-02-06 DIAGNOSIS — C7952 Secondary malignant neoplasm of bone marrow: Principal | ICD-10-CM

## 2018-02-06 LAB — CBC
HCT: 37.2 % — ABNORMAL LOW (ref 40.0–52.0)
Hemoglobin: 12.6 g/dL — ABNORMAL LOW (ref 13.0–18.0)
MCH: 30.3 pg (ref 26.0–34.0)
MCHC: 33.9 g/dL (ref 32.0–36.0)
MCV: 89.4 fL (ref 80.0–100.0)
Platelets: 215 10*3/uL (ref 150–440)
RBC: 4.16 MIL/uL — ABNORMAL LOW (ref 4.40–5.90)
RDW: 15.4 % — AB (ref 11.5–14.5)
WBC: 10.3 10*3/uL (ref 3.8–10.6)

## 2018-02-12 ENCOUNTER — Ambulatory Visit: Payer: Medicare PPO | Admitting: Radiation Oncology

## 2018-02-12 ENCOUNTER — Ambulatory Visit
Admission: RE | Admit: 2018-02-12 | Discharge: 2018-02-12 | Disposition: A | Discharge status: Home / Self Care | Payer: Medicare PPO | Source: Ambulatory Visit | Attending: Radiation Oncology | Admitting: Radiation Oncology

## 2018-02-12 DIAGNOSIS — C7951 Secondary malignant neoplasm of bone: Secondary | ICD-10-CM | POA: Insufficient documentation

## 2018-02-12 DIAGNOSIS — C61 Malignant neoplasm of prostate: Secondary | ICD-10-CM | POA: Diagnosis present

## 2018-02-12 MED ORDER — RADIUM RA 223 DICHLORIDE 30 MCCI/ML IV SOLN
127.0700 | Freq: Once | INTRAVENOUS | Status: AC
  Administered 2018-02-12: 127.07 via INTRAVENOUS

## 2018-02-12 NOTE — Progress Notes (Signed)
Radiation Oncology Xofigo Infusion  Name: [name]   Date:   02/12/2018 MRN:  [id number] DOB: [birth year]    [name] presents to thenuclear medicine for second macro X injection REFERRING PROVIDER: Noreene Filbert, MD  HPI: patient is a 77 year old male who has been treated in the past with external beam radiation therapy for stage IV castrate resistant prostate cancer.He is now starting [place]. He is doing well continues have pain in right hip..patient is having today some sore throat and pain in his left neck. I have suggested some Aleve for that.  .  COMPLICATIONS OF TREATMENT: none  FOLLOW UP COMPLIANCE: keeps appointments   PHYSICAL EXAM:  There were no vitals taken for this visit. Well-developed well-nourished patient in NAD. HEENT reveals PERLA, EOMI, discs not visualized.  Oral cavity is clear. No oral mucosal lesions are identified. Neck is clear without evidence of cervical or supraclavicular adenopathy. Lungs are clear to A&P. Cardiac examination is essentially unremarkable with regular rate and rhythm without murmur rub or thrill. Abdomen is benign with no organomegaly or masses noted. Motor sensory and DTR levels are equal and symmetric in the upper and lower extremities. Cranial nerves II through XII are grossly intact. Proprioception is intact. No peripheral adenopathy or edema is identified. No motor or sensory levels are noted. Crude visual fields are within normal range.  RADIOLOGY RESULTS: no current films for review  PLAN: Peripheral line was started on the patient and 20 cc of normal saline were passed to make sure there was patency of the lines. [name] was administered over a 5 minute infusion push by nuclear medicine technologist supervised by radiation oncologist. After completion of IV push of Xofigo 30 cc an additional saline were passed through the peripheral line. Oral lines syringes drapes and original container of Xofigo were then taken to nuclear  medicine for storage. Patient tolerated the procedure well without side effect or complaint. Patient has anti-emetic medication. Have scheduled the patient for a three-week followup to check on his counts. Patient is to call sooner with any side effects or complaints.    Noreene Filbert, MD

## 2018-02-17 ENCOUNTER — Inpatient Hospital Stay (HOSPITAL_BASED_OUTPATIENT_CLINIC_OR_DEPARTMENT_OTHER): Payer: Medicare PPO | Admitting: Internal Medicine

## 2018-02-17 ENCOUNTER — Encounter: Payer: Self-pay | Admitting: Internal Medicine

## 2018-02-17 ENCOUNTER — Inpatient Hospital Stay: Payer: Medicare PPO | Attending: Internal Medicine

## 2018-02-17 ENCOUNTER — Inpatient Hospital Stay: Payer: Medicare PPO

## 2018-02-17 ENCOUNTER — Other Ambulatory Visit: Payer: Self-pay

## 2018-02-17 VITALS — BP 120/76 | HR 77 | Temp 97.1°F | Resp 16 | Wt 189.0 lb

## 2018-02-17 DIAGNOSIS — Z7982 Long term (current) use of aspirin: Secondary | ICD-10-CM | POA: Diagnosis not present

## 2018-02-17 DIAGNOSIS — Z803 Family history of malignant neoplasm of breast: Secondary | ICD-10-CM | POA: Diagnosis not present

## 2018-02-17 DIAGNOSIS — K219 Gastro-esophageal reflux disease without esophagitis: Secondary | ICD-10-CM | POA: Diagnosis not present

## 2018-02-17 DIAGNOSIS — E119 Type 2 diabetes mellitus without complications: Secondary | ICD-10-CM | POA: Diagnosis not present

## 2018-02-17 DIAGNOSIS — C7951 Secondary malignant neoplasm of bone: Secondary | ICD-10-CM | POA: Diagnosis not present

## 2018-02-17 DIAGNOSIS — Z923 Personal history of irradiation: Secondary | ICD-10-CM

## 2018-02-17 DIAGNOSIS — I4892 Unspecified atrial flutter: Secondary | ICD-10-CM | POA: Insufficient documentation

## 2018-02-17 DIAGNOSIS — Z79899 Other long term (current) drug therapy: Secondary | ICD-10-CM | POA: Diagnosis not present

## 2018-02-17 DIAGNOSIS — I350 Nonrheumatic aortic (valve) stenosis: Secondary | ICD-10-CM | POA: Insufficient documentation

## 2018-02-17 DIAGNOSIS — Z87891 Personal history of nicotine dependence: Secondary | ICD-10-CM | POA: Insufficient documentation

## 2018-02-17 DIAGNOSIS — M2550 Pain in unspecified joint: Secondary | ICD-10-CM | POA: Diagnosis not present

## 2018-02-17 DIAGNOSIS — Z87442 Personal history of urinary calculi: Secondary | ICD-10-CM | POA: Insufficient documentation

## 2018-02-17 DIAGNOSIS — C61 Malignant neoplasm of prostate: Secondary | ICD-10-CM | POA: Insufficient documentation

## 2018-02-17 DIAGNOSIS — M549 Dorsalgia, unspecified: Secondary | ICD-10-CM

## 2018-02-17 DIAGNOSIS — Z192 Hormone resistant malignancy status: Secondary | ICD-10-CM

## 2018-02-17 DIAGNOSIS — E7800 Pure hypercholesterolemia, unspecified: Secondary | ICD-10-CM | POA: Diagnosis not present

## 2018-02-17 DIAGNOSIS — I10 Essential (primary) hypertension: Secondary | ICD-10-CM

## 2018-02-17 DIAGNOSIS — R5383 Other fatigue: Secondary | ICD-10-CM | POA: Diagnosis not present

## 2018-02-17 LAB — CBC WITH DIFFERENTIAL/PLATELET
BASOS PCT: 2 %
Basophils Absolute: 0.1 10*3/uL (ref 0–0.1)
EOS ABS: 0.5 10*3/uL (ref 0–0.7)
Eosinophils Relative: 6 %
HEMATOCRIT: 36.2 % — AB (ref 40.0–52.0)
HEMOGLOBIN: 12.1 g/dL — AB (ref 13.0–18.0)
Lymphocytes Relative: 10 %
Lymphs Abs: 0.9 10*3/uL — ABNORMAL LOW (ref 1.0–3.6)
MCH: 30 pg (ref 26.0–34.0)
MCHC: 33.5 g/dL (ref 32.0–36.0)
MCV: 89.6 fL (ref 80.0–100.0)
Monocytes Absolute: 0.7 10*3/uL (ref 0.2–1.0)
Monocytes Relative: 9 %
NEUTROS ABS: 6.2 10*3/uL (ref 1.4–6.5)
NEUTROS PCT: 73 %
Platelets: 226 10*3/uL (ref 150–440)
RBC: 4.04 MIL/uL — AB (ref 4.40–5.90)
RDW: 14.8 % — ABNORMAL HIGH (ref 11.5–14.5)
WBC: 8.4 10*3/uL (ref 3.8–10.6)

## 2018-02-17 LAB — COMPREHENSIVE METABOLIC PANEL
ALK PHOS: 70 U/L (ref 38–126)
ALT: 12 U/L (ref 0–44)
ANION GAP: 11 (ref 5–15)
AST: 23 U/L (ref 15–41)
Albumin: 3.4 g/dL — ABNORMAL LOW (ref 3.5–5.0)
BUN: 30 mg/dL — ABNORMAL HIGH (ref 8–23)
CALCIUM: 9.9 mg/dL (ref 8.9–10.3)
CHLORIDE: 101 mmol/L (ref 98–111)
CO2: 23 mmol/L (ref 22–32)
CREATININE: 1.42 mg/dL — AB (ref 0.61–1.24)
GFR, EST AFRICAN AMERICAN: 54 mL/min — AB (ref >60)
GFR, EST NON AFRICAN AMERICAN: 46 mL/min — AB (ref >60)
Glucose, Bld: 232 mg/dL — ABNORMAL HIGH (ref 70–99)
Potassium: 3.8 mmol/L (ref 3.5–5.1)
SODIUM: 135 mmol/L (ref 135–145)
Total Bilirubin: 0.1 mg/dL — ABNORMAL LOW (ref 0.3–1.2)
Total Protein: 6.7 g/dL (ref 6.5–8.1)

## 2018-02-17 LAB — PSA: PROSTATIC SPECIFIC ANTIGEN: 4.84 ng/mL — AB (ref 0.00–4.00)

## 2018-02-17 MED ORDER — HYDROCODONE-ACETAMINOPHEN 5-325 MG PO TABS: 1.0000 | ORAL_TABLET | Freq: Two times a day (BID) | ORAL | 0 refills | Status: DC | PRN

## 2018-02-17 MED ORDER — DENOSUMAB 120 MG/1.7ML ~~LOC~~ SOLN
120.0000 mg | Freq: Once | SUBCUTANEOUS | Status: AC
Start: 2018-02-17 — End: 2018-02-17
  Administered 2018-02-17: 120 mg via SUBCUTANEOUS
  Filled 2018-02-17: qty 1.7

## 2018-02-17 MED ORDER — LEUPROLIDE ACETATE (4 MONTH) 30 MG IM KIT
30.0000 mg | PACK | Freq: Once | INTRAMUSCULAR | Status: DC
  Filled 2018-02-17: qty 30

## 2018-02-17 MED ORDER — FENTANYL 50 MCG/HR TD PT72: 50.0000 ug | MEDICATED_PATCH | TRANSDERMAL | 0 refills | Status: DC

## 2018-02-17 NOTE — Assessment & Plan Note (Addendum)
#  Metastatic castrate resistant prostate cancer-Lupron and Xgeva.  STABLE.   # Currently on Xofigo status post cycle #2 on 6/26th;   # Right pain improved status post radiation; continue fentanyl/hydorcdonet twice a day.  STABLE.  New prescriptions given.  # Bone mets-Xgeva q. Monthly.  Stable.  Continue Xgeva.  #Status post left ureteral stenting; stable; # Creatinine- elevated  1.4/basleine 1.2.  Recommend increased fluid intake.  # follow up in 1 months/labs- PSA; x-geva/Lupron.

## 2018-02-17 NOTE — Progress Notes (Signed)
Lupron NOT given today.  Patient last received Lupron on 10/28/17 and is not due for another 10 days.  Insurance will not cover inj if given more than 7 days before due date.

## 2018-02-17 NOTE — Progress Notes (Signed)
[place] OFFICE PROGRESS NOTE  Patient Care Team: Tamsen Roers, MD as PCP - General (Family Medicine)  Cancer Staging No matching staging information was found for the patient.   Oncology History   # AUG 2016- METASTATIC PROSTATE CA/ STAGE IV; Castrate sensitive [PSA- 48] ; mets- lumbar spine [s/p pal RT to Aug 2016; Dr.Crystal] /Pelvic LN; 1994- Prostate CA s/p Surgery [facility]]; Lupron q4 M; MARCH 2017- Bone scan- L4/Right pubic rami uptake; PSA- 0.1/testosterone-castrate [<3]  # Bone lesions on denosumab;DEC 2016- Recm q 39M  # NOV 2018- CASTRATE RESISTANT; declined chemo; 08/06/2017- start Zytiga+prednisone; Stopped April 24th [rising PSA];  # may 2019- Xofigo   # Left hydronephrosis s/p stenting [Dr.Budzyn] --------------------------------------------   DIAGNOSIS: [ ]  castrate resistant prostate cancer  STAGE:  IV       ;GOALS: palliative  CURRENT/MOST RECENT THERAPY [ 5/29/2019[name]      Prostate cancer metastatic to bone [facility])      INTERVAL HISTORY:  [name] 77 y.o.  male pleasant patient above history of castrate resistant prostate cancer is here for follow-up.  Patient had cycle #2 of Xofigo last week.  He continues to complain of pain in his back and joints.  Is currently on fentanyl hydrocodone is not any worse.  No nausea no vomiting.  Review of Systems  Constitutional: Positive for malaise/fatigue. Negative for chills, diaphoresis, fever and weight loss.  HENT: Negative for nosebleeds and sore throat.   Eyes: Negative for double vision.  Respiratory: Negative for cough, hemoptysis, sputum production, shortness of breath and wheezing.   Cardiovascular: Negative for chest pain, palpitations, orthopnea and leg swelling.  Gastrointestinal: Negative for abdominal pain, blood in stool, constipation, diarrhea, heartburn, melena, nausea and vomiting.  Genitourinary: Negative for dysuria, frequency and urgency.  Musculoskeletal:  Positive for back pain and joint pain.  Skin: Negative.  Negative for itching and rash.  Neurological: Negative for dizziness, tingling, focal weakness, weakness and headaches.  Endo/Heme/Allergies: Does not bruise/bleed easily.  Psychiatric/Behavioral: Negative for depression. The patient is not nervous/anxious and does not have insomnia.       PAST MEDICAL HISTORY :  Past Medical History:  Diagnosis Date  . Aortic stenosis, moderate 01/30/2014  . Arthritis   . Atrial flutter ([place]) 02/02/2014  . Diabetes mellitus without complication (HCC)    diet control  . GERD (gastroesophageal reflux disease)   . Heart murmur   . Heart murmur   . History of kidney stones   . HTN (hypertension) 01/30/2014  . Hypercholesteremia   . Hypertension   . Prostate cancer ([place])    metastatic  . Weakness of left leg     PAST SURGICAL HISTORY :   Past Surgical History:  Procedure Laterality Date  . CATARACT EXTRACTION W/ INTRAOCULAR LENS IMPLANT Bilateral   . COLON RESECTION    . CYSTOSCOPY W/ RETROGRADES Left 06/08/2015   Procedure: CYSTOSCOPY WITH RETROGRADE PYELOGRAM;  Surgeon: Nickie Retort, MD;  Location: [facility] ORS;  Service: Urology;  Laterality: Left;  . CYSTOSCOPY W/ URETERAL STENT PLACEMENT Left 10/05/2015   Procedure: CYSTOSCOPY WITH STENT REPLACEMENT;  Surgeon: Nickie Retort, MD;  Location: [facility]S;  Service: Urology;  Laterality: Left;  . CYSTOSCOPY W/ URETERAL STENT PLACEMENT Left 01/04/2016   Procedure: CYSTOSCOPY WITH STENT REPLACEMENT;  Surgeon: Nickie Retort, MD;  Location: [facility] ORS;  Service: Urology;  Laterality: Left;  . CYSTOSCOPY W/ URETERAL STENT PLACEMENT Left 04/11/2016   Procedure: CYSTOSCOPY WITH STENT REPLACEMENT;  Surgeon: Nickie Retort, MD;  Location: [facility];  Service: Urology;  Laterality: Left;  . CYSTOSCOPY W/ URETERAL STENT PLACEMENT Left 07/18/2016   Procedure: CYSTOSCOPY WITH STENT REPLACEMENT;  Surgeon: Nickie Retort, MD;  Location: [facility]  ORS;  Service: Urology;  Laterality: Left;  . CYSTOSCOPY W/ URETERAL STENT PLACEMENT Left 11/02/2016   Procedure: CYSTOSCOPY WITH STENT REPLACEMENT;  Surgeon: Nickie Retort, MD;  Location: [facility];  Service: Urology;  Laterality: Left;  . CYSTOSCOPY W/ URETERAL STENT PLACEMENT Left 02/08/2017   Procedure: CYSTOSCOPY WITH STENT REPLACEMENT;  Surgeon: Nickie Retort, MD;  Location: [facility];  Service: Urology;  Laterality: Left;  . CYSTOSCOPY W/ URETERAL STENT PLACEMENT Left 05/10/2017   Procedure: CYSTOSCOPY WITH STENT REPLACEMENT;  Surgeon: Nickie Retort, MD;  Location: [facility];  Service: Urology;  Laterality: Left;  . CYSTOSCOPY WITH STENT PLACEMENT Left 08/16/2017   Procedure: CYSTOSCOPY WITH STENT EXCHANGE;  Surgeon: Abbie Sons, MD;  Location: [facility];  Service: Urology;  Laterality: Left;  . CYSTOSCOPY WITH STENT PLACEMENT Left 12/17/2017   Procedure: CYSTOSCOPY WITH STENT EXCHANGE;  Surgeon: Abbie Sons, MD;  Location: [facility];  Service: Urology;  Laterality: Left;  . EYE SURGERY Bilateral Sept and Oct.2012   cataract  . HERNIA REPAIR    . PROSTATECTOMY  1994    FAMILY HISTORY :   Family History  Problem Relation Age of Onset  . Aneurysm Father   . Cancer Mother        breast    SOCIAL HISTORY:   Social History   Tobacco Use  . Smoking status: Former Smoker    Packs/day: 1.50    Years: 25.00    Pack years: 37.50    Types: Cigarettes    Last attempt to quit: 01/21/1974    Years since quitting: 44.1  . Smokeless tobacco: Never Used  Substance Use Topics  . Alcohol use: No  . Drug use: No    ALLERGIES:  has No Known Allergies.  MEDICATIONS:  Current Outpatient Medications  Medication Sig Dispense Refill  . allopurinol (ZYLOPRIM) 300 MG tablet Take 300 mg by mouth daily.     . Ascorbic Acid (VITAMIN C) 100 MG tablet Take 500 mg by mouth daily.    Marland Kitchen aspirin 81 MG tablet Take 81 mg by mouth daily.     Marland Kitchen atorvastatin (LIPITOR) 10 MG tablet Take  10 mg by mouth daily at 6 PM.     . carvedilol (COREG) 6.25 MG tablet Take 1 tablet (6.25 mg total) by mouth 2 (two) times daily with a meal. 60 tablet 2  . chlorthalidone (HYGROTON) 25 MG tablet Take 25 mg by mouth daily.     . fentaNYL (DURAGESIC - DOSED MCG/HR) 50 MCG/HR Place 1 patch (50 mcg total) onto the skin every 3 (three) days. 10 patch 0  . ferrous sulfate 325 (65 FE) MG tablet Take 325 mg by mouth daily with breakfast.     . HYDROcodone-acetaminophen (NORCO/VICODIN) 5-325 MG tablet Take 1 tablet by mouth every 12 (twelve) hours as needed. 60 tablet 0  . Loratadine-Pseudoephedrine (ALLERGY RELIEF D PO) Take 1 tablet by mouth daily as needed (seasonal allergy).    Marland Kitchen oxybutynin (DITROPAN) 5 MG tablet Take 5 mg by mouth 4 (four) times daily as needed for bladder spasms.     . pantoprazole (PROTONIX) 40 MG tablet Take 1 tablet (40 mg total) by mouth daily before breakfast. 30 tablet 2  . ranitidine (ZANTAC) 150  MG tablet Take 150 mg by mouth 2 (two) times daily.     . vitamin B-12 (CYANOCOBALAMIN) 1000 MCG tablet Take 1,000 mcg by mouth daily.    . Vitamin D, Ergocalciferol, (DRISDOL) 50000 units CAPS capsule Take 50,000 Units by mouth every Tuesday.      No current facility-administered medications for this visit.     PHYSICAL EXAMINATION: ECOG PERFORMANCE STATUS: 1 - Symptomatic but completely ambulatory  BP 120/76 (BP Location: Left Arm, Patient Position: Sitting)   Pulse 77   Temp (!) 97.1 F (36.2 C) (Tympanic)   Resp 16   Wt 189 lb (85.7 kg)   BMI 26.36 kg/m   Filed Weights   02/17/18 1008  Weight: 189 lb (85.7 kg)    GENERAL: Well-nourished well-developed; Alert, no distress and comfortable. Accompanied by family.  EYES: no pallor or icterus OROPHARYNX: no thrush or ulceration; NECK: supple; no lymph nodes felt. LYMPH:  no palpable lymphadenopathy in the axillary or inguinal regions LUNGS: Decreased breath sounds auscultation bilaterally. No wheeze or  crackles HEART/CVS: regular rate & rhythm and no murmurs; No lower extremity edema ABDOMEN:abdomen soft, non-tender and normal bowel sounds. No hepatomegaly or splenomegaly.  Musculoskeletal:no cyanosis of digits and no clubbing  PSYCH: alert & oriented x 3 with fluent speech NEURO: no focal motor/sensory deficits SKIN:  no rashes or significant lesions    LABORATORY DATA:  I have reviewed the data as listed    Component Value Date/Time   NA 135 02/17/2018 0927   K 3.8 02/17/2018 0927   CL 101 02/17/2018 0927   CO2 23 02/17/2018 0927   GLUCOSE 232 (H) 02/17/2018 0927   BUN 30 (H) 02/17/2018 0927   CREATININE 1.42 (H) 02/17/2018 0927   CALCIUM 9.9 02/17/2018 0927   PROT 6.7 02/17/2018 0927   ALBUMIN 3.4 (L) 02/17/2018 0927   AST 23 02/17/2018 0927   ALT 12 02/17/2018 0927   ALKPHOS 70 02/17/2018 0927   BILITOT 0.1 (L) 02/17/2018 0927   GFRNONAA 46 (L) 02/17/2018 0927   GFRAA 54 (L) 02/17/2018 0927    No results found for: SPEP, UPEP  Lab Results  Component Value Date   WBC 8.4 02/17/2018   NEUTROABS 6.2 02/17/2018   HGB 12.1 (L) 02/17/2018   HCT 36.2 (L) 02/17/2018   MCV 89.6 02/17/2018   PLT 226 02/17/2018      Chemistry      Component Value Date/Time   NA 135 02/17/2018 0927   K 3.8 02/17/2018 0927   CL 101 02/17/2018 0927   CO2 23 02/17/2018 0927   BUN 30 (H) 02/17/2018 0927   CREATININE 1.42 (H) 02/17/2018 0927      Component Value Date/Time   CALCIUM 9.9 02/17/2018 0927   ALKPHOS 70 02/17/2018 0927   AST 23 02/17/2018 0927   ALT 12 02/17/2018 0927   BILITOT 0.1 (L) 02/17/2018 0927       RADIOGRAPHIC STUDIES: I have personally reviewed the radiological images as listed and agreed with the findings in the report. No results found.   ASSESSMENT & PLAN:  Prostate cancer metastatic to bone [facility]) #Metastatic castrate resistant prostate cancer-Lupron and Xgeva.  STABLE.   # Currently on Xofigo status post cycle #2 on 6/26th;   # Right pain  improved status post radiation; continue fentanyl/hydorcdonet twice a day.  STABLE.  New prescriptions given.  # Bone mets-Xgeva q. Monthly.  Stable.  Continue Xgeva.  #Status post left ureteral stenting; stable; # Creatinine- elevated  1.4/basleine 1.2.  Recommend increased fluid intake.  # follow up in 1 months/labs- PSA; x-geva/Lupron.    Orders Placed This Encounter  Procedures  . CBC with Differential/Platelet    Standing Status:   Future    Standing Expiration Date:   02/18/2019  . Comprehensive metabolic panel    Standing Status:   Future    Standing Expiration Date:   02/18/2019  . PSA    Standing Status:   Future    Standing Expiration Date:   02/18/2019   All questions were answered. The patient knows to call the clinic with any problems, questions or concerns.      Cammie Sickle, MD 02/20/2018 8:40 PM

## 2018-03-06 ENCOUNTER — Inpatient Hospital Stay: Payer: Medicare PPO

## 2018-03-06 DIAGNOSIS — C7951 Secondary malignant neoplasm of bone: Secondary | ICD-10-CM

## 2018-03-06 DIAGNOSIS — C61 Malignant neoplasm of prostate: Secondary | ICD-10-CM | POA: Diagnosis not present

## 2018-03-06 LAB — CBC WITH DIFFERENTIAL/PLATELET
BASOS PCT: 1 %
Basophils Absolute: 0.1 10*3/uL (ref 0–0.1)
EOS ABS: 0.5 10*3/uL (ref 0–0.7)
Eosinophils Relative: 7 %
HEMATOCRIT: 36.6 % — AB (ref 40.0–52.0)
Hemoglobin: 12.2 g/dL — ABNORMAL LOW (ref 13.0–18.0)
Lymphocytes Relative: 12 %
Lymphs Abs: 0.9 10*3/uL — ABNORMAL LOW (ref 1.0–3.6)
MCH: 29.9 pg (ref 26.0–34.0)
MCHC: 33.5 g/dL (ref 32.0–36.0)
MCV: 89.2 fL (ref 80.0–100.0)
MONO ABS: 0.7 10*3/uL (ref 0.2–1.0)
MONOS PCT: 10 %
Neutro Abs: 5.4 10*3/uL (ref 1.4–6.5)
Neutrophils Relative %: 70 %
Platelets: 219 10*3/uL (ref 150–440)
RBC: 4.1 MIL/uL — ABNORMAL LOW (ref 4.40–5.90)
RDW: 15 % — ABNORMAL HIGH (ref 11.5–14.5)
WBC: 7.7 10*3/uL (ref 3.8–10.6)

## 2018-03-12 ENCOUNTER — Ambulatory Visit
Admission: RE | Admit: 2018-03-12 | Discharge: 2018-03-12 | Disposition: A | Discharge status: Home / Self Care | Payer: Medicare PPO | Source: Ambulatory Visit | Attending: Radiation Oncology | Admitting: Radiation Oncology

## 2018-03-12 ENCOUNTER — Ambulatory Visit: Payer: Medicare PPO | Attending: Radiation Oncology | Admitting: Radiation Oncology

## 2018-03-12 DIAGNOSIS — C61 Malignant neoplasm of prostate: Secondary | ICD-10-CM | POA: Diagnosis not present

## 2018-03-12 DIAGNOSIS — C7951 Secondary malignant neoplasm of bone: Secondary | ICD-10-CM | POA: Insufficient documentation

## 2018-03-12 MED ORDER — RADIUM RA 223 DICHLORIDE 30 MCCI/ML IV SOLN
130.6000 | Freq: Once | INTRAVENOUS | Status: AC
  Administered 2018-03-12: 130.6 via INTRAVENOUS

## 2018-03-12 NOTE — Progress Notes (Signed)
Radiation Oncology Follow up Note  Name: [name]   Date:   03/12/2018 MRN:  [id number] DOB: 10-19-1940    [name] presents to the clinic today for  Xofigothird infusion.  REFERRING PROVIDER: Noreene Filbert, MD  HPI: 77 year old male with stage IV castrate resistant prostate cancer seen today for infusion of his third Xofigotreatment. He states he is having significant pain in his upper and lower extremities although these are not the areas of main.bony involvement as he has T9 vertebral findings as well as rib findings.  COMPLICATIONS OF TREATMENT: none  FOLLOW UP COMPLIANCE: keeps appointments   PHYSICAL EXAM:  There were no vitals taken for this visit. Well-developed well-nourished patient in NAD. HEENT reveals PERLA, EOMI, discs not visualized.  Oral cavity is clear. No oral mucosal lesions are identified. Neck is clear without evidence of cervical or supraclavicular adenopathy. Lungs are clear to A&P. Cardiac examination is essentially unremarkable with regular rate and rhythm without murmur rub or thrill. Abdomen is benign with no organomegaly or masses noted. Motor sensory and DTR levels are equal and symmetric in the upper and lower extremities. Cranial nerves II through XII are grossly intact. Proprioception is intact. No peripheral adenopathy or edema is identified. No motor or sensory levels are noted. Crude visual fields are within normal range.  RADIOLOGY RESULTS: no films for review  PLAN: Peripheral line was started on the patient and 20 cc of normal saline were passed to make sure there was patency of the lines. [name] was administered over a 5 minute infusion push by nuclear medicine technologist supervised by radiation oncologist. After completion of IV push of Xofigo 30 cc an additional saline were passed through the peripheral line. Oral lines syringes drapes and original container of Xofigo were then taken to nuclear medicine for storage. Patient tolerated  the procedure well without side effect or complaint. Patient has anti-emetic medication. Have scheduled the patient for a three-week followup to check on his counts. Patient is to call sooner with any side effects or complaints.    Noreene Filbert, MD

## 2018-03-18 ENCOUNTER — Telehealth: Payer: Self-pay | Admitting: Radiology

## 2018-03-18 NOTE — Telephone Encounter (Signed)
When should patient be scheduled for his next stent exchange?

## 2018-03-19 NOTE — Telephone Encounter (Signed)
Sometime in September

## 2018-03-24 ENCOUNTER — Inpatient Hospital Stay: Payer: Medicare PPO

## 2018-03-24 ENCOUNTER — Other Ambulatory Visit: Payer: Self-pay

## 2018-03-24 ENCOUNTER — Inpatient Hospital Stay: Payer: Medicare PPO | Attending: Internal Medicine

## 2018-03-24 ENCOUNTER — Inpatient Hospital Stay (HOSPITAL_BASED_OUTPATIENT_CLINIC_OR_DEPARTMENT_OTHER): Payer: Medicare PPO | Admitting: Internal Medicine

## 2018-03-24 VITALS — BP 116/76 | HR 71 | Temp 96.9°F | Resp 20 | Ht 71.0 in | Wt 184.5 lb

## 2018-03-24 DIAGNOSIS — R5383 Other fatigue: Secondary | ICD-10-CM

## 2018-03-24 DIAGNOSIS — M549 Dorsalgia, unspecified: Secondary | ICD-10-CM | POA: Diagnosis not present

## 2018-03-24 DIAGNOSIS — Z87891 Personal history of nicotine dependence: Secondary | ICD-10-CM | POA: Insufficient documentation

## 2018-03-24 DIAGNOSIS — K219 Gastro-esophageal reflux disease without esophagitis: Secondary | ICD-10-CM

## 2018-03-24 DIAGNOSIS — C7951 Secondary malignant neoplasm of bone: Secondary | ICD-10-CM | POA: Insufficient documentation

## 2018-03-24 DIAGNOSIS — Z7982 Long term (current) use of aspirin: Secondary | ICD-10-CM | POA: Diagnosis not present

## 2018-03-24 DIAGNOSIS — C61 Malignant neoplasm of prostate: Secondary | ICD-10-CM

## 2018-03-24 DIAGNOSIS — I129 Hypertensive chronic kidney disease with stage 1 through stage 4 chronic kidney disease, or unspecified chronic kidney disease: Secondary | ICD-10-CM

## 2018-03-24 DIAGNOSIS — N183 Chronic kidney disease, stage 3 (moderate): Secondary | ICD-10-CM

## 2018-03-24 DIAGNOSIS — R011 Cardiac murmur, unspecified: Secondary | ICD-10-CM | POA: Diagnosis not present

## 2018-03-24 DIAGNOSIS — Z192 Hormone resistant malignancy status: Secondary | ICD-10-CM | POA: Diagnosis not present

## 2018-03-24 DIAGNOSIS — M1990 Unspecified osteoarthritis, unspecified site: Secondary | ICD-10-CM | POA: Diagnosis not present

## 2018-03-24 DIAGNOSIS — Z87442 Personal history of urinary calculi: Secondary | ICD-10-CM | POA: Diagnosis not present

## 2018-03-24 DIAGNOSIS — N1330 Unspecified hydronephrosis: Secondary | ICD-10-CM

## 2018-03-24 DIAGNOSIS — E7800 Pure hypercholesterolemia, unspecified: Secondary | ICD-10-CM | POA: Insufficient documentation

## 2018-03-24 DIAGNOSIS — Z79899 Other long term (current) drug therapy: Secondary | ICD-10-CM

## 2018-03-24 DIAGNOSIS — M2550 Pain in unspecified joint: Secondary | ICD-10-CM | POA: Diagnosis not present

## 2018-03-24 DIAGNOSIS — I350 Nonrheumatic aortic (valve) stenosis: Secondary | ICD-10-CM

## 2018-03-24 DIAGNOSIS — E1122 Type 2 diabetes mellitus with diabetic chronic kidney disease: Secondary | ICD-10-CM | POA: Diagnosis not present

## 2018-03-24 DIAGNOSIS — I4892 Unspecified atrial flutter: Secondary | ICD-10-CM

## 2018-03-24 LAB — COMPREHENSIVE METABOLIC PANEL
ALT: 11 U/L (ref 0–44)
ANION GAP: 8 (ref 5–15)
AST: 23 U/L (ref 15–41)
Albumin: 3.7 g/dL (ref 3.5–5.0)
Alkaline Phosphatase: 61 U/L (ref 38–126)
BILIRUBIN TOTAL: 0.3 mg/dL (ref 0.3–1.2)
BUN: 30 mg/dL — AB (ref 8–23)
CHLORIDE: 102 mmol/L (ref 98–111)
CO2: 26 mmol/L (ref 22–32)
Calcium: 10.1 mg/dL (ref 8.9–10.3)
Creatinine, Ser: 1.2 mg/dL (ref 0.61–1.24)
GFR, EST NON AFRICAN AMERICAN: 57 mL/min — AB (ref >60)
Glucose, Bld: 131 mg/dL — ABNORMAL HIGH (ref 70–99)
Potassium: 4.1 mmol/L (ref 3.5–5.1)
Sodium: 136 mmol/L (ref 135–145)
TOTAL PROTEIN: 6.9 g/dL (ref 6.5–8.1)

## 2018-03-24 LAB — CBC WITH DIFFERENTIAL/PLATELET
BASOS ABS: 0.1 10*3/uL (ref 0–0.1)
Basophils Relative: 2 %
EOS PCT: 8 %
Eosinophils Absolute: 0.5 10*3/uL (ref 0–0.7)
HCT: 36.5 % — ABNORMAL LOW (ref 40.0–52.0)
Hemoglobin: 12 g/dL — ABNORMAL LOW (ref 13.0–18.0)
Lymphocytes Relative: 15 %
Lymphs Abs: 1 10*3/uL (ref 1.0–3.6)
MCH: 29.1 pg (ref 26.0–34.0)
MCHC: 33 g/dL (ref 32.0–36.0)
MCV: 88.2 fL (ref 80.0–100.0)
MONO ABS: 0.8 10*3/uL (ref 0.2–1.0)
Monocytes Relative: 11 %
NEUTROS PCT: 64 %
Neutro Abs: 4.4 10*3/uL (ref 1.4–6.5)
PLATELETS: 173 10*3/uL (ref 150–440)
RBC: 4.14 MIL/uL — ABNORMAL LOW (ref 4.40–5.90)
RDW: 15.4 % — AB (ref 11.5–14.5)
WBC: 6.8 10*3/uL (ref 3.8–10.6)

## 2018-03-24 LAB — PSA: PROSTATIC SPECIFIC ANTIGEN: 3.55 ng/mL (ref 0.00–4.00)

## 2018-03-24 MED ORDER — LEUPROLIDE ACETATE (4 MONTH) 30 MG IM KIT
30.0000 mg | PACK | Freq: Once | INTRAMUSCULAR | Status: AC
  Administered 2018-03-24: 30 mg via INTRAMUSCULAR
  Filled 2018-03-24: qty 30

## 2018-03-24 MED ORDER — DENOSUMAB 120 MG/1.7ML ~~LOC~~ SOLN
120.0000 mg | Freq: Once | SUBCUTANEOUS | Status: AC
Start: 2018-03-24 — End: 2018-03-24
  Administered 2018-03-24: 120 mg via SUBCUTANEOUS
  Filled 2018-03-24: qty 1.7

## 2018-03-24 MED ORDER — HYDROCODONE-ACETAMINOPHEN 5-325 MG PO TABS: 1.0000 | ORAL_TABLET | Freq: Two times a day (BID) | ORAL | 0 refills | Status: DC | PRN

## 2018-03-24 MED ORDER — FENTANYL 50 MCG/HR TD PT72: 50.0000 ug | MEDICATED_PATCH | TRANSDERMAL | 0 refills | Status: DC

## 2018-03-24 NOTE — Assessment & Plan Note (Addendum)
#  Metastatic castrate resistant prostate cancer-Lupron [8/05] and Xgeva.  Stable  # Currently on Xofigo status post cycle #3   # Right pain improved status post radiation; continue fentanyl/hydorcdonet twice a day.  Stable able  # Bone mets-Xgeva q. Monthly.  Stable.  Continue Xgeva.  #Status post left ureteral stenting; stable  # CKD- stage III; stable  # follow up in 1 months/labs- PSA.

## 2018-03-24 NOTE — Progress Notes (Signed)
[place] OFFICE PROGRESS NOTE  Patient Care Team: Tamsen Roers, MD as PCP - General (Family Medicine)  Cancer Staging No matching staging information was found for the patient.   Oncology History   # AUG 2016- METASTATIC PROSTATE CA/ STAGE IV; Castrate sensitive [PSA- 48] ; mets- lumbar spine [s/p pal RT to Aug 2016; Dr.Crystal] /Pelvic LN; 1994- Prostate CA s/p Surgery [facility]]; Lupron q4 M; MARCH 2017- Bone scan- L4/Right pubic rami uptake; PSA- 0.1/testosterone-castrate [<3]  # Bone lesions on denosumab;DEC 2016- Recm q 34M  # NOV 2018- CASTRATE RESISTANT; declined chemo; 08/06/2017- start Zytiga+prednisone; Stopped April 24th [rising PSA];  # may 2019- Xofigo   # Left hydronephrosis s/p stenting [Dr.Budzyn] --------------------------------------------   DIAGNOSIS: [ ]  castrate resistant prostate cancer  STAGE:  IV       ;GOALS: palliative  CURRENT/MOST RECENT THERAPY [ 5/29/2019[name]      Prostate cancer metastatic to bone [facility])      INTERVAL HISTORY:  [name] 77 y.o.  male pleasant patient above history of castrate resistant prostate cancer is here for follow-up.  Patient is currently on Xofigo with radiation oncology.  Patient continues to complain of back pain/joint pains.  Currently stable.  Not any worse.  Continues to be on fentanyl patch and narcotic pain medication.  No constipation.  Mild to moderate fatigue.  Review of Systems  Constitutional: Positive for malaise/fatigue. Negative for chills, diaphoresis, fever and weight loss.  HENT: Negative for nosebleeds and sore throat.   Eyes: Negative for double vision.  Respiratory: Negative for cough, hemoptysis, sputum production, shortness of breath and wheezing.   Cardiovascular: Negative for chest pain, palpitations, orthopnea and leg swelling.  Gastrointestinal: Negative for abdominal pain, blood in stool, constipation, diarrhea, heartburn, melena, nausea and vomiting.   Genitourinary: Negative for dysuria, frequency and urgency.  Musculoskeletal: Positive for back pain and joint pain.  Skin: Negative.  Negative for itching and rash.  Neurological: Negative for dizziness, tingling, focal weakness, weakness and headaches.  Endo/Heme/Allergies: Does not bruise/bleed easily.  Psychiatric/Behavioral: Negative for depression. The patient is not nervous/anxious and does not have insomnia.       PAST MEDICAL HISTORY :  Past Medical History:  Diagnosis Date  . Aortic stenosis, moderate 01/30/2014  . Arthritis   . Atrial flutter ([place]) 02/02/2014  . Diabetes mellitus without complication (HCC)    diet control  . GERD (gastroesophageal reflux disease)   . Heart murmur   . Heart murmur   . History of kidney stones   . HTN (hypertension) 01/30/2014  . Hypercholesteremia   . Hypertension   . Prostate cancer ([place])    metastatic  . Weakness of left leg     PAST SURGICAL HISTORY :   Past Surgical History:  Procedure Laterality Date  . CATARACT EXTRACTION W/ INTRAOCULAR LENS IMPLANT Bilateral   . COLON RESECTION    . CYSTOSCOPY W/ RETROGRADES Left 06/08/2015   Procedure: CYSTOSCOPY WITH RETROGRADE PYELOGRAM;  Surgeon: Nickie Retort, MD;  Location: [facility];  Service: Urology;  Laterality: Left;  . CYSTOSCOPY W/ URETERAL STENT PLACEMENT Left 10/05/2015   Procedure: CYSTOSCOPY WITH STENT REPLACEMENT;  Surgeon: Nickie Retort, MD;  Location: [facility];  Service: Urology;  Laterality: Left;  . CYSTOSCOPY W/ URETERAL STENT PLACEMENT Left 01/04/2016   Procedure: CYSTOSCOPY WITH STENT REPLACEMENT;  Surgeon: Nickie Retort, MD;  Location: [facility];  Service: Urology;  Laterality: Left;  . CYSTOSCOPY W/ URETERAL STENT PLACEMENT Left 04/11/2016  Procedure: CYSTOSCOPY WITH STENT REPLACEMENT;  Surgeon: Nickie Retort, MD;  Location: [facility];  Service: Urology;  Laterality: Left;  . CYSTOSCOPY W/ URETERAL STENT PLACEMENT Left 07/18/2016   Procedure:  CYSTOSCOPY WITH STENT REPLACEMENT;  Surgeon: Nickie Retort, MD;  Location: [facility];  Service: Urology;  Laterality: Left;  . CYSTOSCOPY W/ URETERAL STENT PLACEMENT Left 11/02/2016   Procedure: CYSTOSCOPY WITH STENT REPLACEMENT;  Surgeon: Nickie Retort, MD;  Location: [facility];  Service: Urology;  Laterality: Left;  . CYSTOSCOPY W/ URETERAL STENT PLACEMENT Left 02/08/2017   Procedure: CYSTOSCOPY WITH STENT REPLACEMENT;  Surgeon: Nickie Retort, MD;  Location: [facility];  Service: Urology;  Laterality: Left;  . CYSTOSCOPY W/ URETERAL STENT PLACEMENT Left 05/10/2017   Procedure: CYSTOSCOPY WITH STENT REPLACEMENT;  Surgeon: Nickie Retort, MD;  Location: [facility];  Service: Urology;  Laterality: Left;  . CYSTOSCOPY WITH STENT PLACEMENT Left 08/16/2017   Procedure: CYSTOSCOPY WITH STENT EXCHANGE;  Surgeon: Abbie Sons, MD;  Location: [facility];  Service: Urology;  Laterality: Left;  . CYSTOSCOPY WITH STENT PLACEMENT Left 12/17/2017   Procedure: CYSTOSCOPY WITH STENT EXCHANGE;  Surgeon: Abbie Sons, MD;  Location: [facility];  Service: Urology;  Laterality: Left;  . EYE SURGERY Bilateral Sept and Oct.2012   cataract  . HERNIA REPAIR    . PROSTATECTOMY  1994    FAMILY HISTORY :   Family History  Problem Relation Age of Onset  . Aneurysm Father   . Cancer Mother        breast    SOCIAL HISTORY:   Social History   Tobacco Use  . Smoking status: Former Smoker    Packs/day: 1.50    Years: 25.00    Pack years: 37.50    Types: Cigarettes    Last attempt to quit: 01/21/1974    Years since quitting: 44.2  . Smokeless tobacco: Never Used  Substance Use Topics  . Alcohol use: No  . Drug use: No    ALLERGIES:  has No Known Allergies.  MEDICATIONS:  Current Outpatient Medications  Medication Sig Dispense Refill  . allopurinol (ZYLOPRIM) 300 MG tablet Take 300 mg by mouth daily.     . Ascorbic Acid (VITAMIN C) 100 MG tablet Take 500 mg by mouth daily.    Marland Kitchen aspirin 81  MG tablet Take 81 mg by mouth daily.     Marland Kitchen atorvastatin (LIPITOR) 10 MG tablet Take 10 mg by mouth daily at 6 PM.     . carvedilol (COREG) 6.25 MG tablet Take 1 tablet (6.25 mg total) by mouth 2 (two) times daily with a meal. 60 tablet 2  . chlorthalidone (HYGROTON) 25 MG tablet Take 25 mg by mouth daily.     . fentaNYL (DURAGESIC - DOSED MCG/HR) 50 MCG/HR Place 1 patch (50 mcg total) onto the skin every 3 (three) days. 10 patch 0  . ferrous sulfate 325 (65 FE) MG tablet Take 325 mg by mouth daily with breakfast.     . HYDROcodone-acetaminophen (NORCO/VICODIN) 5-325 MG tablet Take 1 tablet by mouth every 12 (twelve) hours as needed. 60 tablet 0  . Loratadine-Pseudoephedrine (ALLERGY RELIEF D PO) Take 1 tablet by mouth daily as needed (seasonal allergy).    Marland Kitchen oxybutynin (DITROPAN) 5 MG tablet Take 5 mg by mouth 4 (four) times daily as needed for bladder spasms.     . pantoprazole (PROTONIX) 40 MG tablet Take 1 tablet (40 mg total) by mouth daily before breakfast. 30 tablet  2  . ranitidine (ZANTAC) 150 MG tablet Take 150 mg by mouth 2 (two) times daily.     . vitamin B-12 (CYANOCOBALAMIN) 1000 MCG tablet Take 1,000 mcg by mouth daily.    . Vitamin D, Ergocalciferol, (DRISDOL) 50000 units CAPS capsule Take 50,000 Units by mouth every Tuesday.      No current facility-administered medications for this visit.     PHYSICAL EXAMINATION: ECOG PERFORMANCE STATUS: 1 - Symptomatic but completely ambulatory  BP 116/76 (BP Location: Left Arm, Patient Position: Sitting)   Pulse 71   Temp (!) 96.9 F (36.1 C) (Tympanic)   Resp 20   Ht 5\' 11"  (1.803 m)   Wt 184 lb 8 oz (83.7 kg)   BMI 25.73 kg/m   Filed Weights   03/24/18 1040  Weight: 184 lb 8 oz (83.7 kg)    GENERAL: Well-nourished well-developed; Alert, no distress and comfortable.  He is accompanied by his wife.  He is in a wheelchair. EYES: no pallor or icterus OROPHARYNX: no thrush or ulceration; NECK: supple; no lymph nodes felt. LYMPH:   no palpable lymphadenopathy in the axillary or inguinal regions LUNGS: Decreased breath sounds auscultation bilaterally. No wheeze or crackles HEART/CVS: regular rate & rhythm and no murmurs; No lower extremity edema ABDOMEN:abdomen soft, non-tender and normal bowel sounds. No hepatomegaly or splenomegaly.  Musculoskeletal:no cyanosis of digits and no clubbing  PSYCH: alert & oriented x 3 with fluent speech NEURO: no focal motor/sensory deficits SKIN:  no rashes or significant lesions    LABORATORY DATA:  I have reviewed the data as listed    Component Value Date/Time   NA 136 03/24/2018 1006   K 4.1 03/24/2018 1006   CL 102 03/24/2018 1006   CO2 26 03/24/2018 1006   GLUCOSE 131 (H) 03/24/2018 1006   BUN 30 (H) 03/24/2018 1006   CREATININE 1.20 03/24/2018 1006   CALCIUM 10.1 03/24/2018 1006   PROT 6.9 03/24/2018 1006   ALBUMIN 3.7 03/24/2018 1006   AST 23 03/24/2018 1006   ALT 11 03/24/2018 1006   ALKPHOS 61 03/24/2018 1006   BILITOT 0.3 03/24/2018 1006   GFRNONAA 57 (L) 03/24/2018 1006   GFRAA >60 03/24/2018 1006    No results found for: SPEP, UPEP  Lab Results  Component Value Date   WBC 6.8 04/03/2018   NEUTROABS 4.4 04/03/2018   HGB 12.2 (L) 04/03/2018   HCT 36.6 (L) 04/03/2018   MCV 87.6 04/03/2018   PLT 169 04/03/2018      Chemistry      Component Value Date/Time   NA 136 03/24/2018 1006   K 4.1 03/24/2018 1006   CL 102 03/24/2018 1006   CO2 26 03/24/2018 1006   BUN 30 (H) 03/24/2018 1006   CREATININE 1.20 03/24/2018 1006      Component Value Date/Time   CALCIUM 10.1 03/24/2018 1006   ALKPHOS 61 03/24/2018 1006   AST 23 03/24/2018 1006   ALT 11 03/24/2018 1006   BILITOT 0.3 03/24/2018 1006       RADIOGRAPHIC STUDIES: I have personally reviewed the radiological images as listed and agreed with the findings in the report. No results found.   ASSESSMENT & PLAN:  Prostate cancer metastatic to bone [facility]) #Metastatic castrate resistant prostate  cancer-Lupron [8/05] and Xgeva.  Stable  # Currently on Xofigo status post cycle #3   # Right pain improved status post radiation; continue fentanyl/hydorcdonet twice a day.  Stable able  # Bone mets-Xgeva q. Monthly.  Stable.  Continue Xgeva.  #Status post left ureteral stenting; stable  # CKD- stage III; stable  # follow up in 1 months/labs- PSA.    No orders of the defined types were placed in this encounter.  All questions were answered. The patient knows to call the clinic with any problems, questions or concerns.      Cammie Sickle, MD 04/14/2018 9:50 PM

## 2018-04-03 ENCOUNTER — Encounter: Payer: Self-pay | Admitting: *Deleted

## 2018-04-03 ENCOUNTER — Inpatient Hospital Stay: Payer: Medicare PPO

## 2018-04-03 ENCOUNTER — Other Ambulatory Visit: Payer: Self-pay | Admitting: Radiology

## 2018-04-03 DIAGNOSIS — C61 Malignant neoplasm of prostate: Secondary | ICD-10-CM | POA: Diagnosis not present

## 2018-04-03 DIAGNOSIS — C7951 Secondary malignant neoplasm of bone: Secondary | ICD-10-CM

## 2018-04-03 DIAGNOSIS — N1330 Unspecified hydronephrosis: Secondary | ICD-10-CM

## 2018-04-03 LAB — CBC WITH DIFFERENTIAL/PLATELET
Basophils Absolute: 0.1 10*3/uL (ref 0–0.1)
Basophils Relative: 2 %
Eosinophils Absolute: 0.5 10*3/uL (ref 0–0.7)
Eosinophils Relative: 7 %
HCT: 36.6 % — ABNORMAL LOW (ref 40.0–52.0)
Hemoglobin: 12.2 g/dL — ABNORMAL LOW (ref 13.0–18.0)
LYMPHS ABS: 1 10*3/uL (ref 1.0–3.6)
Lymphocytes Relative: 15 %
MCH: 29.3 pg (ref 26.0–34.0)
MCHC: 33.4 g/dL (ref 32.0–36.0)
MCV: 87.6 fL (ref 80.0–100.0)
Monocytes Absolute: 0.7 10*3/uL (ref 0.2–1.0)
Monocytes Relative: 11 %
Neutro Abs: 4.4 10*3/uL (ref 1.4–6.5)
Neutrophils Relative %: 65 %
PLATELETS: 169 10*3/uL (ref 150–440)
RBC: 4.18 MIL/uL — AB (ref 4.40–5.90)
RDW: 15.8 % — ABNORMAL HIGH (ref 11.5–14.5)
WBC: 6.8 10*3/uL (ref 3.8–10.6)

## 2018-04-09 ENCOUNTER — Ambulatory Visit: Payer: Medicare PPO | Admitting: Radiation Oncology

## 2018-04-09 ENCOUNTER — Encounter
Admission: RE | Admit: 2018-04-09 | Discharge: 2018-04-09 | Disposition: A | Discharge status: Home / Self Care | Payer: Medicare PPO | Source: Ambulatory Visit | Attending: Radiation Oncology | Admitting: Radiation Oncology

## 2018-04-09 ENCOUNTER — Encounter: Admission: RE | Admit: 2018-04-09 | Payer: Medicare PPO | Source: Ambulatory Visit

## 2018-04-09 DIAGNOSIS — C61 Malignant neoplasm of prostate: Secondary | ICD-10-CM

## 2018-04-09 DIAGNOSIS — C7951 Secondary malignant neoplasm of bone: Secondary | ICD-10-CM | POA: Insufficient documentation

## 2018-04-09 MED ORDER — RADIUM RA 223 DICHLORIDE 30 MCCI/ML IV SOLN
125.5100 | Freq: Once | INTRAVENOUS | Status: AC
  Administered 2018-04-09: 125.51 via INTRAVENOUS

## 2018-04-09 NOTE — Progress Notes (Signed)
Radiation Oncology Follow up Note  Name: [name]   Date:   04/09/2018 MRN:  [id number] DOB: 1940-12-13    [name] presents to the nuclear medicine department today for fourth Xofigo infusion.  REFERRING PROVIDER: Noreene Filbert, MD  HPI: patient is a 77 year old male with castrate resistant stage IV prostate cancer seen for his fourthXofigo infusion today. He is complaining of swelling of his ankles I believe is unrelated to his treatments. He also complains of increasing lower back pain radiating to his right flank..he does have a history of right rib fracture with involvement of metastatic prostate cancer.  COMPLICATIONS OF TREATMENT: none  FOLLOW UP COMPLIANCE: keeps appointments   PHYSICAL EXAM:  There were no vitals taken for this visit. Well-developed well-nourished patient in NAD. HEENT reveals PERLA, EOMI, discs not visualized.  Oral cavity is clear. No oral mucosal lesions are identified. Neck is clear without evidence of cervical or supraclavicular adenopathy. Lungs are clear to A&P. Cardiac examination is essentially unremarkable with regular rate and rhythm without murmur rub or thrill. Abdomen is benign with no organomegaly or masses noted. Motor sensory and DTR levels are equal and symmetric in the upper and lower extremities. Cranial nerves II through XII are grossly intact. Proprioception is intact. No peripheral adenopathy or edema is identified. No motor or sensory levels are noted. Crude visual fields are within normal range.  RADIOLOGY RESULTS: previous axumin scan reviewed  PLAN: Peripheral line was started Long the patient and 20 cc of normal saline were passed to make sure there was patency of the lines. [name] was administered over a 5 minute infusion push by nuclear medicine technologist supervised by radiation oncologist. After completion of IV push of Xofigo 30 cc an additional saline were passed through the peripheral line. Oral lines syringes drapes  and original container of Xofigo were then taken to nuclear medicine for storage. Patient tolerated the procedure well without side effect or complaint. Patient has anti-emetic medication. Have scheduled the patient for a three-week followup to check Long his counts. Patient is to call sooner with any side effects or complaints.    Noreene Filbert, MD

## 2018-04-17 ENCOUNTER — Other Ambulatory Visit: Payer: Self-pay | Admitting: Radiology

## 2018-04-22 ENCOUNTER — Other Ambulatory Visit: Payer: Self-pay | Admitting: Internal Medicine

## 2018-04-22 DIAGNOSIS — C61 Malignant neoplasm of prostate: Secondary | ICD-10-CM

## 2018-04-22 DIAGNOSIS — C7951 Secondary malignant neoplasm of bone: Principal | ICD-10-CM

## 2018-04-23 ENCOUNTER — Other Ambulatory Visit: Payer: Self-pay

## 2018-04-23 ENCOUNTER — Encounter: Payer: Self-pay | Admitting: Radiation Oncology

## 2018-04-23 ENCOUNTER — Inpatient Hospital Stay: Payer: Medicare PPO

## 2018-04-23 ENCOUNTER — Other Ambulatory Visit: Payer: Self-pay | Admitting: *Deleted

## 2018-04-23 ENCOUNTER — Ambulatory Visit
Admission: RE | Admit: 2018-04-23 | Discharge: 2018-04-23 | Disposition: A | Discharge status: Home / Self Care | Payer: Medicare PPO | Source: Ambulatory Visit | Attending: Radiation Oncology | Admitting: Radiation Oncology

## 2018-04-23 ENCOUNTER — Inpatient Hospital Stay (HOSPITAL_BASED_OUTPATIENT_CLINIC_OR_DEPARTMENT_OTHER): Payer: Medicare PPO | Admitting: Internal Medicine

## 2018-04-23 ENCOUNTER — Inpatient Hospital Stay: Payer: Medicare PPO | Attending: Internal Medicine

## 2018-04-23 VITALS — BP 137/71 | HR 76 | Temp 97.6°F | Resp 20 | Ht 71.0 in | Wt 187.0 lb

## 2018-04-23 DIAGNOSIS — R5383 Other fatigue: Secondary | ICD-10-CM

## 2018-04-23 DIAGNOSIS — K219 Gastro-esophageal reflux disease without esophagitis: Secondary | ICD-10-CM | POA: Insufficient documentation

## 2018-04-23 DIAGNOSIS — Z923 Personal history of irradiation: Secondary | ICD-10-CM | POA: Diagnosis not present

## 2018-04-23 DIAGNOSIS — Z803 Family history of malignant neoplasm of breast: Secondary | ICD-10-CM | POA: Diagnosis not present

## 2018-04-23 DIAGNOSIS — N183 Chronic kidney disease, stage 3 (moderate): Secondary | ICD-10-CM

## 2018-04-23 DIAGNOSIS — R011 Cardiac murmur, unspecified: Secondary | ICD-10-CM

## 2018-04-23 DIAGNOSIS — R531 Weakness: Secondary | ICD-10-CM | POA: Diagnosis not present

## 2018-04-23 DIAGNOSIS — I350 Nonrheumatic aortic (valve) stenosis: Secondary | ICD-10-CM | POA: Diagnosis not present

## 2018-04-23 DIAGNOSIS — M7989 Other specified soft tissue disorders: Secondary | ICD-10-CM | POA: Diagnosis not present

## 2018-04-23 DIAGNOSIS — E1122 Type 2 diabetes mellitus with diabetic chronic kidney disease: Secondary | ICD-10-CM

## 2018-04-23 DIAGNOSIS — C61 Malignant neoplasm of prostate: Secondary | ICD-10-CM

## 2018-04-23 DIAGNOSIS — Z79899 Other long term (current) drug therapy: Secondary | ICD-10-CM

## 2018-04-23 DIAGNOSIS — Z87442 Personal history of urinary calculi: Secondary | ICD-10-CM | POA: Diagnosis not present

## 2018-04-23 DIAGNOSIS — I129 Hypertensive chronic kidney disease with stage 1 through stage 4 chronic kidney disease, or unspecified chronic kidney disease: Secondary | ICD-10-CM | POA: Diagnosis not present

## 2018-04-23 DIAGNOSIS — C7951 Secondary malignant neoplasm of bone: Secondary | ICD-10-CM

## 2018-04-23 DIAGNOSIS — E7800 Pure hypercholesterolemia, unspecified: Secondary | ICD-10-CM | POA: Diagnosis not present

## 2018-04-23 DIAGNOSIS — Z7982 Long term (current) use of aspirin: Secondary | ICD-10-CM | POA: Diagnosis not present

## 2018-04-23 DIAGNOSIS — Z87891 Personal history of nicotine dependence: Secondary | ICD-10-CM | POA: Insufficient documentation

## 2018-04-23 DIAGNOSIS — I4892 Unspecified atrial flutter: Secondary | ICD-10-CM | POA: Diagnosis not present

## 2018-04-23 DIAGNOSIS — M542 Cervicalgia: Secondary | ICD-10-CM

## 2018-04-23 DIAGNOSIS — I10 Essential (primary) hypertension: Secondary | ICD-10-CM | POA: Diagnosis not present

## 2018-04-23 DIAGNOSIS — Z993 Dependence on wheelchair: Secondary | ICD-10-CM | POA: Insufficient documentation

## 2018-04-23 DIAGNOSIS — M129 Arthropathy, unspecified: Secondary | ICD-10-CM | POA: Insufficient documentation

## 2018-04-23 DIAGNOSIS — M25552 Pain in left hip: Secondary | ICD-10-CM | POA: Insufficient documentation

## 2018-04-23 DIAGNOSIS — M436 Torticollis: Secondary | ICD-10-CM

## 2018-04-23 LAB — COMPREHENSIVE METABOLIC PANEL
ALT: 13 U/L (ref 0–44)
AST: 24 U/L (ref 15–41)
Albumin: 3.8 g/dL (ref 3.5–5.0)
Alkaline Phosphatase: 59 U/L (ref 38–126)
Anion gap: 8 (ref 5–15)
BUN: 24 mg/dL — ABNORMAL HIGH (ref 8–23)
CO2: 28 mmol/L (ref 22–32)
Calcium: 10.7 mg/dL — ABNORMAL HIGH (ref 8.9–10.3)
Chloride: 101 mmol/L (ref 98–111)
Creatinine, Ser: 1.25 mg/dL — ABNORMAL HIGH (ref 0.61–1.24)
GFR calc Af Amer: >60 mL/min (ref >60)
GFR calc non Af Amer: 54 mL/min — ABNORMAL LOW (ref >60)
Glucose, Bld: 128 mg/dL — ABNORMAL HIGH (ref 70–99)
Potassium: 4 mmol/L (ref 3.5–5.1)
Sodium: 137 mmol/L (ref 135–145)
Total Bilirubin: 0.4 mg/dL (ref 0.3–1.2)
Total Protein: 7.3 g/dL (ref 6.5–8.1)

## 2018-04-23 LAB — CBC WITH DIFFERENTIAL/PLATELET
BASOS ABS: 0.1 10*3/uL (ref 0–0.1)
Basophils Relative: 1 %
Eosinophils Absolute: 0.4 10*3/uL (ref 0–0.7)
Eosinophils Relative: 5 %
HEMATOCRIT: 36.7 % — AB (ref 40.0–52.0)
HEMOGLOBIN: 12 g/dL — AB (ref 13.0–18.0)
LYMPHS PCT: 12 %
Lymphs Abs: 1 10*3/uL (ref 1.0–3.6)
MCH: 29 pg (ref 26.0–34.0)
MCHC: 32.9 g/dL (ref 32.0–36.0)
MCV: 88.2 fL (ref 80.0–100.0)
MONO ABS: 0.9 10*3/uL (ref 0.2–1.0)
MONOS PCT: 10 %
Neutro Abs: 6.2 10*3/uL (ref 1.4–6.5)
Neutrophils Relative %: 72 %
Platelets: 170 10*3/uL (ref 150–440)
RBC: 4.15 MIL/uL — ABNORMAL LOW (ref 4.40–5.90)
RDW: 16.3 % — AB (ref 11.5–14.5)
WBC: 8.5 10*3/uL (ref 3.8–10.6)

## 2018-04-23 LAB — PSA: Prostatic Specific Antigen: 3.26 ng/mL (ref 0.00–4.00)

## 2018-04-23 MED ORDER — DIAZEPAM 5 MG PO TABS: ORAL_TABLET | ORAL | 0 refills | Status: DC

## 2018-04-23 MED ORDER — DENOSUMAB 120 MG/1.7ML ~~LOC~~ SOLN
120.0000 mg | Freq: Once | SUBCUTANEOUS | Status: AC
  Administered 2018-04-23: 120 mg via SUBCUTANEOUS
  Filled 2018-04-23: qty 1.7

## 2018-04-23 NOTE — Progress Notes (Signed)
Patient's wife reports that pt fell in the yard last week. Patient has a scratch on his leg. Patient reports soreness in his hips bilaterally and worse when he lays down. Pt reports lower extremity ankle edema.

## 2018-04-23 NOTE — Progress Notes (Signed)
Radiation Oncology Follow up Note  Name: [name]   Date:   04/23/2018 MRN:  [id number] DOB: 06/27/1941    [name] presents to the clinic today for evaluation of significant pain in his neck and left hip on patient undergoingXofigo treatment.  REFERRING PROVIDER: Tamsen Roers, MD  HPI: patient is a 77 year old male with castrate resistant stage IV prostate cancer currently on his fourth Xofigoinfusion He's been recently put on a diuretic and hypertensive medication for swelling in his ankles. He is also complaining of left hip pain and significant and neck pain. His lastscan was over 6 months prior was a PET CT scan showing widespread bony metastasis. He does have some limited motion in his neck. He is wheelchair-bound. He states the radium treatments have only beenminimally helpful..  COMPLICATIONS OF TREATMENT: none  FOLLOW UP COMPLIANCE: keeps appointments   PHYSICAL EXAM:  BP (P) 132/78 (BP Location: Left Arm)   Pulse (P) 83   Temp (!) (P) 97 F (36.1 C) (Tympanic)   Resp (P) 16   Wt (P) 187 lb 1 oz (84.8 kg)   BMI (P) 26.09 kg/m  Elderly frail male wheelchair-bound in NAD range of motion of his neck elicits significant pain. Range of motion of his left lower extremity elicit some pain on range of motion of his lower extremity.Well-developed well-nourished patient in NAD. HEENT reveals PERLA, EOMI, discs not visualized.  Oral cavity is clear. No oral mucosal lesions are identified. Neck is clear without evidence of cervical or supraclavicular adenopathy. Lungs are clear to A&P. Cardiac examination is essentially unremarkable with regular rate and rhythm without murmur rub or thrill. Abdomen is benign with no organomegaly or masses noted. Motor sensory and DTR levels are equal and symmetric in the upper and lower extremities. Cranial nerves II through XII are grossly intact. Proprioception is intact. No peripheral adenopathy or edema is identified. No motor or sensory levels  are noted. Crude visual fields are within normal range.  RADIOLOGY RESULTS: bone scan ordered  PLAN: at this time since his been over 6 months I would like to reassess his current bone metastasis with a repeat of his bone scan. This will be better enable me to decide on treatment fields should we decide on external beam radiation therapy to help alleviate some of his pain. Bone scan was ordered I will review that and make other suggestions as far as treatment planning at that time. Patient wife both seem to comprehend my treatment plan well.  I would like to take this opportunity to thank you for allowing me to participate in the care of your patient.Noreene Filbert, MD

## 2018-04-23 NOTE — Assessment & Plan Note (Addendum)
#  Metastatic castrate resistant prostate cancer-Lupron [8/05] and Xgeva.  Question progression given the worsening neck pain [see discussion below]  # Currently on Xofigo status post cycle #4; currently awaiting #5 in 2 weeks.  #Neck pain/restrictive movement of the neck-clear etiology.  Recommend CT scan ASAP; however patient wants to have it done in approximately week.  Recommend Valium for CAT scan.  # Right hip pain- status post radiation stable;   #Hypercalcemia calcium 10.7-status post Xgeva today.  Monitor closely.  Patient also awaiting a bone scan with Dr. Donella Stade on September 18.  # Bone mets-Xgeva q. Monthly.  Stable.  Xgeva today.  # CKD- stage III; stable  #  CT neck in 1 weeks [9/11;labs for radiation]; follow TBD.

## 2018-04-23 NOTE — Progress Notes (Signed)
[place] OFFICE PROGRESS NOTE  Patient Care Team: Tamsen Roers, MD as PCP - General (Family Medicine)  Cancer Staging No matching staging information was found for the patient.   Oncology History   # AUG 2016- METASTATIC PROSTATE CA/ STAGE IV; Castrate sensitive [PSA- 48] ; mets- lumbar spine [s/p pal RT to Aug 2016; Dr.Crystal] /Pelvic LN; 1994- Prostate CA s/p Surgery [facility]]; Lupron q4 M; MARCH 2017- Bone scan- L4/Right pubic rami uptake; PSA- 0.1/testosterone-castrate [<3]  # Bone lesions on denosumab;DEC 2016- Recm q 21M  # NOV 2018- CASTRATE RESISTANT; declined chemo; 08/06/2017- start Zytiga+prednisone; Stopped April 24th [rising PSA];  # may 2019- Xofigo   # Left hydronephrosis s/p stenting [Dr.Budzyn] --------------------------------------------   DIAGNOSIS: [ ]  castrate resistant prostate cancer  STAGE:  IV       ;GOALS: palliative  CURRENT/MOST RECENT THERAPY [ 5/29/2019[name]      Prostate cancer metastatic to bone [facility])      INTERVAL HISTORY:  [name] 77 y.o.  male pleasant patient above history of castrate resistant prostate cancer is here for follow-up.  Patient is currently on Xofigo with radiation oncology.  Patient complains of neck pain over the last 2 weeks.  Complains of restricted movement of the neck; progressive getting worse.   Overall he feels poorly.  Complains of ongoing fatigue.  Continues to be on fentanyl patch and narcotic pain medication.  No constipation.  Mild to moderate fatigue.  Patient also has a recent fall.  Review of Systems  Constitutional: Positive for malaise/fatigue. Negative for chills, diaphoresis, fever and weight loss.  HENT: Negative for nosebleeds and sore throat.   Eyes: Negative for double vision.  Respiratory: Negative for cough, hemoptysis, sputum production, shortness of breath and wheezing.   Cardiovascular: Negative for chest pain, palpitations, orthopnea and leg swelling.   Gastrointestinal: Negative for abdominal pain, blood in stool, constipation, diarrhea, heartburn, melena, nausea and vomiting.  Genitourinary: Negative for dysuria, frequency and urgency.  Musculoskeletal: Positive for back pain, joint pain and neck pain.  Skin: Negative.  Negative for itching and rash.  Neurological: Negative for dizziness, tingling, focal weakness, weakness and headaches.  Endo/Heme/Allergies: Does not bruise/bleed easily.  Psychiatric/Behavioral: Negative for depression. The patient is not nervous/anxious and does not have insomnia.       PAST MEDICAL HISTORY :  Past Medical History:  Diagnosis Date  . Aortic stenosis, moderate 01/30/2014  . Arthritis   . Atrial flutter ([place]) 02/02/2014  . Diabetes mellitus without complication (HCC)    diet control  . GERD (gastroesophageal reflux disease)   . Heart murmur   . Heart murmur   . History of kidney stones   . HTN (hypertension) 01/30/2014  . Hypercholesteremia   . Hypertension   . Prostate cancer ([place])    metastatic  . Weakness of left leg     PAST SURGICAL HISTORY :   Past Surgical History:  Procedure Laterality Date  . CATARACT EXTRACTION W/ INTRAOCULAR LENS IMPLANT Bilateral   . COLON RESECTION    . CYSTOSCOPY W/ RETROGRADES Left 06/08/2015   Procedure: CYSTOSCOPY WITH RETROGRADE PYELOGRAM;  Surgeon: Nickie Retort, MD;  Location: [facility];  Service: Urology;  Laterality: Left;  . CYSTOSCOPY W/ URETERAL STENT PLACEMENT Left 10/05/2015   Procedure: CYSTOSCOPY WITH STENT REPLACEMENT;  Surgeon: Nickie Retort, MD;  Location: [facility];  Service: Urology;  Laterality: Left;  . CYSTOSCOPY W/ URETERAL STENT PLACEMENT Left 01/04/2016   Procedure: CYSTOSCOPY WITH STENT  REPLACEMENT;  Surgeon: Nickie Retort, MD;  Location: [facility];  Service: Urology;  Laterality: Left;  . CYSTOSCOPY W/ URETERAL STENT PLACEMENT Left 04/11/2016   Procedure: CYSTOSCOPY WITH STENT REPLACEMENT;  Surgeon: Nickie Retort, MD;   Location: [facility];  Service: Urology;  Laterality: Left;  . CYSTOSCOPY W/ URETERAL STENT PLACEMENT Left 07/18/2016   Procedure: CYSTOSCOPY WITH STENT REPLACEMENT;  Surgeon: Nickie Retort, MD;  Location: [facility];  Service: Urology;  Laterality: Left;  . CYSTOSCOPY W/ URETERAL STENT PLACEMENT Left 11/02/2016   Procedure: CYSTOSCOPY WITH STENT REPLACEMENT;  Surgeon: Nickie Retort, MD;  Location: [facility];  Service: Urology;  Laterality: Left;  . CYSTOSCOPY W/ URETERAL STENT PLACEMENT Left 02/08/2017   Procedure: CYSTOSCOPY WITH STENT REPLACEMENT;  Surgeon: Nickie Retort, MD;  Location: [facility];  Service: Urology;  Laterality: Left;  . CYSTOSCOPY W/ URETERAL STENT PLACEMENT Left 05/10/2017   Procedure: CYSTOSCOPY WITH STENT REPLACEMENT;  Surgeon: Nickie Retort, MD;  Location: [facility];  Service: Urology;  Laterality: Left;  . CYSTOSCOPY WITH STENT PLACEMENT Left 08/16/2017   Procedure: CYSTOSCOPY WITH STENT EXCHANGE;  Surgeon: Abbie Sons, MD;  Location: [facility];  Service: Urology;  Laterality: Left;  . CYSTOSCOPY WITH STENT PLACEMENT Left 12/17/2017   Procedure: CYSTOSCOPY WITH STENT EXCHANGE;  Surgeon: Abbie Sons, MD;  Location: [facility];  Service: Urology;  Laterality: Left;  . EYE SURGERY Bilateral Sept and Oct.2012   cataract  . HERNIA REPAIR    . PROSTATECTOMY  1994    FAMILY HISTORY :   Family History  Problem Relation Age of Onset  . Aneurysm Father   . Cancer Mother        breast    SOCIAL HISTORY:   Social History   Tobacco Use  . Smoking status: Former Smoker    Packs/day: 1.50    Years: 25.00    Pack years: 37.50    Types: Cigarettes    Last attempt to quit: 01/21/1974    Years since quitting: 44.2  . Smokeless tobacco: Never Used  Substance Use Topics  . Alcohol use: No  . Drug use: No    ALLERGIES:  has No Known Allergies.  MEDICATIONS:  Current Outpatient Medications  Medication Sig Dispense Refill  . allopurinol (ZYLOPRIM)  300 MG tablet Take 300 mg by mouth daily.     . Ascorbic Acid (VITAMIN C) 100 MG tablet Take 500 mg by mouth daily.    Marland Kitchen aspirin 81 MG tablet Take 81 mg by mouth daily.     Marland Kitchen atorvastatin (LIPITOR) 10 MG tablet Take 10 mg by mouth daily at 6 PM.     . carvedilol (COREG) 6.25 MG tablet Take 1 tablet (6.25 mg total) by mouth 2 (two) times daily with a meal. 60 tablet 2  . chlorthalidone (HYGROTON) 25 MG tablet Take 25 mg by mouth daily.     . fentaNYL (DURAGESIC - DOSED MCG/HR) 50 MCG/HR Place 1 patch (50 mcg total) onto the skin every 3 (three) days. 10 patch 0  . ferrous sulfate 325 (65 FE) MG tablet Take 325 mg by mouth daily with breakfast.     . HYDROcodone-acetaminophen (NORCO/VICODIN) 5-325 MG tablet Take 1 tablet by mouth every 12 (twelve) hours as needed. 60 tablet 0  . Loratadine-Pseudoephedrine (ALLERGY RELIEF D PO) Take 1 tablet by mouth daily as needed (seasonal allergy).    Marland Kitchen oxybutynin (DITROPAN) 5 MG tablet Take 5 mg by mouth 4 (four) times daily  as needed for bladder spasms.     . pantoprazole (PROTONIX) 40 MG tablet Take 1 tablet (40 mg total) by mouth daily before breakfast. 30 tablet 2  . ranitidine (ZANTAC) 150 MG tablet Take 150 mg by mouth 2 (two) times daily.     . vitamin B-12 (CYANOCOBALAMIN) 1000 MCG tablet Take 1,000 mcg by mouth daily.    . Vitamin D, Ergocalciferol, (DRISDOL) 50000 units CAPS capsule Take 50,000 Units by mouth every Tuesday.     . diazepam (VALIUM) 5 MG tablet One pill 45-60 mins prior to CT SCAN; 1 pill 15 mins prior if needed. 4 tablet 0   No current facility-administered medications for this visit.     PHYSICAL EXAMINATION: ECOG PERFORMANCE STATUS: 1 - Symptomatic but completely ambulatory  BP 137/71 (Patient Position: Sitting)   Pulse 76   Temp 97.6 F (36.4 C) (Tympanic)   Resp 20   Ht 5\' 11"  (1.803 m)   Wt 187 lb (84.8 kg)   BMI 26.08 kg/m   Filed Weights   04/23/18 1459  Weight: 187 lb (84.8 kg)    Physical Exam   Constitutional: He is oriented to person, place, and time and well-developed, well-nourished, and in no distress.  In wheel chair  HENT:  Head: Normocephalic and atraumatic.  Mouth/Throat: Oropharynx is clear and moist. No oropharyngeal exudate.  Eyes: Pupils are equal, round, and reactive to light.  Neck: Normal range of motion. Neck supple.  Cardiovascular: Normal rate and regular rhythm.  Pulmonary/Chest: No respiratory distress. He has no wheezes.  Abdominal: Soft. Bowel sounds are normal. He exhibits no distension and no mass. There is no tenderness. There is no rebound and no guarding.  Musculoskeletal: He exhibits no edema or tenderness.  Restricted movement of neck from side to side; limited extension; mild swelling on right side of neck.   Neurological: He is alert and oriented to person, place, and time.  Skin: Skin is warm.  Psychiatric: Affect normal.       LABORATORY DATA:  I have reviewed the data as listed    Component Value Date/Time   NA 137 04/23/2018 1340   K 4.0 04/23/2018 1340   CL 101 04/23/2018 1340   CO2 28 04/23/2018 1340   GLUCOSE 128 (H) 04/23/2018 1340   BUN 24 (H) 04/23/2018 1340   CREATININE 1.25 (H) 04/23/2018 1340   CALCIUM 10.7 (H) 04/23/2018 1340   PROT 7.3 04/23/2018 1340   ALBUMIN 3.8 04/23/2018 1340   AST 24 04/23/2018 1340   ALT 13 04/23/2018 1340   ALKPHOS 59 04/23/2018 1340   BILITOT 0.4 04/23/2018 1340   GFRNONAA 54 (L) 04/23/2018 1340   GFRAA >60 04/23/2018 1340    No results found for: SPEP, UPEP  Lab Results  Component Value Date   WBC 8.5 04/23/2018   NEUTROABS 6.2 04/23/2018   HGB 12.0 (L) 04/23/2018   HCT 36.7 (L) 04/23/2018   MCV 88.2 04/23/2018   PLT 170 04/23/2018      Chemistry      Component Value Date/Time   NA 137 04/23/2018 1340   K 4.0 04/23/2018 1340   CL 101 04/23/2018 1340   CO2 28 04/23/2018 1340   BUN 24 (H) 04/23/2018 1340   CREATININE 1.25 (H) 04/23/2018 1340      Component Value Date/Time    CALCIUM 10.7 (H) 04/23/2018 1340   ALKPHOS 59 04/23/2018 1340   AST 24 04/23/2018 1340   ALT 13 04/23/2018 1340   BILITOT 0.4  04/23/2018 1340       RADIOGRAPHIC STUDIES: I have personally reviewed the radiological images as listed and agreed with the findings in the report. No results found.   ASSESSMENT & PLAN:  Prostate cancer metastatic to bone [facility]) #Metastatic castrate resistant prostate cancer-Lupron [8/05] and Xgeva.  Question progression given the worsening neck pain [see discussion below]  # Currently on Xofigo status post cycle #4; currently awaiting #5 in 2 weeks.  #Neck pain/restrictive movement of the neck-clear etiology.  Recommend CT scan ASAP; however patient wants to have it done in approximately week.  Recommend Valium for CAT scan.  # Right hip pain- status post radiation stable;   #Hypercalcemia calcium 10.7-status post Xgeva today.  Monitor closely.  Patient also awaiting a bone scan with Dr. Donella Stade on September 18.  # Bone mets-Xgeva q. Monthly.  Stable.  Xgeva today.  # CKD- stage III; stable  #  CT neck in 1 weeks [9/11;labs for radiation]; follow TBD.     Orders Placed This Encounter  Procedures  . CT SOFT TISSUE NECK W CONTRAST    Standing Status:   Future    Standing Expiration Date:   07/24/2019    Order Specific Question:   ** REASON FOR EXAM (FREE TEXT)    Answer:   neck pain; restricted movement of neck    Order Specific Question:   If indicated for the ordered procedure, I authorize the administration of contrast media per Radiology protocol    Answer:   Yes    Order Specific Question:   Preferred imaging location?    Answer:   [facility]    Order Specific Question:   Radiology Contrast Protocol - do NOT remove file path    Answer:   [email or web address]   All questions were answered. The patient knows to call the clinic with any problems, questions or concerns.      Cammie Sickle, MD 04/23/2018  7:48 PM

## 2018-04-30 ENCOUNTER — Encounter: Payer: Self-pay | Admitting: *Deleted

## 2018-04-30 ENCOUNTER — Inpatient Hospital Stay: Payer: Medicare PPO

## 2018-04-30 ENCOUNTER — Other Ambulatory Visit: Payer: Self-pay | Admitting: *Deleted

## 2018-04-30 DIAGNOSIS — C61 Malignant neoplasm of prostate: Secondary | ICD-10-CM | POA: Diagnosis not present

## 2018-04-30 DIAGNOSIS — C7951 Secondary malignant neoplasm of bone: Secondary | ICD-10-CM

## 2018-04-30 LAB — CBC WITH DIFFERENTIAL/PLATELET
BASOS ABS: 0.1 10*3/uL (ref 0–0.1)
BASOS PCT: 1 %
Eosinophils Absolute: 0.5 10*3/uL (ref 0–0.7)
Eosinophils Relative: 7 %
HEMATOCRIT: 35.6 % — AB (ref 40.0–52.0)
HEMOGLOBIN: 11.9 g/dL — AB (ref 13.0–18.0)
Lymphocytes Relative: 13 %
Lymphs Abs: 0.9 10*3/uL — ABNORMAL LOW (ref 1.0–3.6)
MCH: 29.2 pg (ref 26.0–34.0)
MCHC: 33.4 g/dL (ref 32.0–36.0)
MCV: 87.3 fL (ref 80.0–100.0)
MONOS PCT: 10 %
Monocytes Absolute: 0.7 10*3/uL (ref 0.2–1.0)
NEUTROS ABS: 4.8 10*3/uL (ref 1.4–6.5)
NEUTROS PCT: 69 %
Platelets: 178 10*3/uL (ref 150–440)
RBC: 4.08 MIL/uL — AB (ref 4.40–5.90)
RDW: 16.1 % — ABNORMAL HIGH (ref 11.5–14.5)
WBC: 7 10*3/uL (ref 3.8–10.6)

## 2018-04-30 NOTE — Addendum Note (Signed)
Encounter addended by: Daiva Huge, RN on: 04/30/2018 10:04 AM  Actions taken: Order Reconciliation Section accessed, Home Medications modified

## 2018-05-05 ENCOUNTER — Other Ambulatory Visit: Payer: Self-pay

## 2018-05-05 ENCOUNTER — Encounter
Admission: RE | Admit: 2018-05-05 | Discharge: 2018-05-05 | Disposition: A | Discharge status: Home / Self Care | Payer: Medicare PPO | Source: Ambulatory Visit | Attending: Urology | Admitting: Urology

## 2018-05-05 DIAGNOSIS — C61 Malignant neoplasm of prostate: Secondary | ICD-10-CM | POA: Diagnosis present

## 2018-05-05 DIAGNOSIS — M436 Torticollis: Secondary | ICD-10-CM | POA: Diagnosis present

## 2018-05-05 DIAGNOSIS — C7951 Secondary malignant neoplasm of bone: Secondary | ICD-10-CM | POA: Diagnosis present

## 2018-05-05 NOTE — Patient Instructions (Signed)
Your procedure is scheduled [id number] 05/13/18  Report to [facility]. To find out your arrival time please call [phone] between 1PM - 3PM on Monday 05/12/18  Remember: Instructions that are not followed completely may result in serious medical risk, up to and including death, or upon the discretion of your surgeon and anesthesiologist your surgery may need to be rescheduled.     _X__ 1. Do not eat food after midnight the night before your procedure.                 No gum chewing or hard candies. You may drink clear liquids up to 2 hours                 before you are scheduled to arrive for your surgery- DO not drink clear                 liquids within 2 hours of the start of your surgery.                 Clear Liquids include:  water, apple juice without pulp, clear carbohydrate                 drink such as Clearfast or Gatorade, Black Coffee or Tea (Do not add                 anything to coffee or tea).  __X__2.  On the morning of surgery brush your teeth with toothpaste and water, you may rinse your mouth with mouthwash if you wish.  Do not swallow any toothpaste of mouthwash.     _X__ 3.  No Alcohol for 24 hours before or after surgery.   _X__ 4.  Do Not Smoke or use e-cigarettes For 24 Hours Prior to Your Surgery.                 Do not use any chewable tobacco products for at least 6 hours prior to                 surgery.  ____  5.  Bring all medications with you on the day of surgery if instructed.   __X__  6.  Notify your doctor if there is any change in your medical condition      (cold, fever, infections).     Do not wear jewelry, make-up, hairpins, clips or nail polish. Do not wear lotions, powders, or perfumes.  Do not shave 48 hours prior to surgery. Men may shave face and neck. Do not bring valuables to the hospital.    [facility] is not responsible for any belongings or valuables.  Contacts,  dentures/partials or body piercings may not be worn into surgery. Bring a case for your contacts, glasses or hearing aids, a denture cup will be supplied. Leave your suitcase in the car. After surgery it may be brought to your room. For patients admitted to the hospital, discharge time is determined by your treatment team.   Patients discharged the day of surgery will not be allowed to drive home.   Please read over the following fact sheets that you were given:   MRSA Information  __X__ Take these medicines the morning of surgery with A SIP OF WATER:     1. allopurinol (ZYLOPRIM) 300 MG tablet  2. carvedilol (COREG) 6.25 MG tablet  3. fentaNYL (DURAGESIC - DOSED MCG/HR) 50 MCG/HR  4. HYDROcodone-acetaminophen (NORCO/VICODIN) 5-325 MG  table   5. oxybutynin (DITROPAN) 5 MG tablet  6. pantoprazole (PROTONIX) 40 MG tablet  7. ranitidine (ZANTAC) 150 MG tablet    __X__ Stop Blood Thinners Coumadin/Plavix/Xarelto/Pleta/Pradaxa/Eliquis/Effient/Aspirin Wednesday 05/07/18   __X__ Stop Anti-inflammatories 7 days before surgery such as Advil, Ibuprofen, Motrin, BC or Goodies Powder, Naprosyn, Naproxen, Aleve, Aspirin, Meloxicam. May take Tylenol if needed for pain or discomfort.

## 2018-05-06 ENCOUNTER — Ambulatory Visit: Payer: Medicare PPO | Admitting: Radiation Oncology

## 2018-05-06 ENCOUNTER — Ambulatory Visit
Admission: RE | Admit: 2018-05-06 | Discharge: 2018-05-06 | Disposition: A | Discharge status: Home / Self Care | Payer: Medicare PPO | Source: Ambulatory Visit | Attending: Internal Medicine | Admitting: Internal Medicine

## 2018-05-06 ENCOUNTER — Ambulatory Visit
Admission: RE | Admit: 2018-05-06 | Discharge: 2018-05-06 | Disposition: A | Discharge status: Home / Self Care | Payer: Medicare PPO | Source: Ambulatory Visit | Attending: Radiation Oncology | Admitting: Radiation Oncology

## 2018-05-06 DIAGNOSIS — M436 Torticollis: Secondary | ICD-10-CM

## 2018-05-06 DIAGNOSIS — C7951 Secondary malignant neoplasm of bone: Secondary | ICD-10-CM | POA: Insufficient documentation

## 2018-05-06 DIAGNOSIS — C61 Malignant neoplasm of prostate: Secondary | ICD-10-CM | POA: Diagnosis not present

## 2018-05-06 LAB — URINE CULTURE: Culture: NO GROWTH

## 2018-05-06 MED ORDER — IOHEXOL 300 MG/ML  SOLN
100.0000 mL | Freq: Once | INTRAMUSCULAR | Status: AC | PRN
  Administered 2018-05-06: 100 mL via INTRAVENOUS

## 2018-05-06 MED ORDER — RADIUM RA 223 DICHLORIDE 30 MCCI/ML IV SOLN
128.8000 | Freq: Once | INTRAVENOUS | Status: AC
  Administered 2018-05-06: 128.8 via INTRAVENOUS

## 2018-05-07 ENCOUNTER — Encounter
Admission: RE | Admit: 2018-05-07 | Discharge: 2018-05-07 | Disposition: A | Discharge status: Home / Self Care | Payer: Medicare PPO | Source: Ambulatory Visit | Attending: Radiation Oncology | Admitting: Radiation Oncology

## 2018-05-07 DIAGNOSIS — C7951 Secondary malignant neoplasm of bone: Secondary | ICD-10-CM | POA: Diagnosis present

## 2018-05-07 MED ORDER — TECHNETIUM TC 99M MEDRONATE IV KIT
22.9700 | PACK | Freq: Once | INTRAVENOUS | Status: AC | PRN
  Administered 2018-05-07: 22.97 via INTRAVENOUS

## 2018-05-12 MED ORDER — CEFAZOLIN SODIUM-DEXTROSE 2-4 GM/100ML-% IV SOLN
2.0000 g | INTRAVENOUS | Status: AC
  Administered 2018-05-13: 2 g via INTRAVENOUS

## 2018-05-13 ENCOUNTER — Ambulatory Visit
Admission: RE | Admit: 2018-05-13 | Discharge: 2018-05-13 | Disposition: A | Discharge status: Home / Self Care | Payer: Medicare PPO | Source: Ambulatory Visit | Attending: Urology | Admitting: Urology

## 2018-05-13 ENCOUNTER — Encounter
Admission: RE | Disposition: A | Discharge status: Home / Self Care | Payer: Self-pay | Source: Ambulatory Visit | Attending: Urology

## 2018-05-13 ENCOUNTER — Ambulatory Visit: Payer: Medicare PPO | Admitting: Anesthesiology

## 2018-05-13 ENCOUNTER — Encounter: Payer: Self-pay | Admitting: *Deleted

## 2018-05-13 DIAGNOSIS — K219 Gastro-esophageal reflux disease without esophagitis: Secondary | ICD-10-CM | POA: Insufficient documentation

## 2018-05-13 DIAGNOSIS — I350 Nonrheumatic aortic (valve) stenosis: Secondary | ICD-10-CM | POA: Diagnosis not present

## 2018-05-13 DIAGNOSIS — C61 Malignant neoplasm of prostate: Secondary | ICD-10-CM | POA: Insufficient documentation

## 2018-05-13 DIAGNOSIS — N131 Hydronephrosis with ureteral stricture, not elsewhere classified: Secondary | ICD-10-CM | POA: Insufficient documentation

## 2018-05-13 DIAGNOSIS — E119 Type 2 diabetes mellitus without complications: Secondary | ICD-10-CM | POA: Diagnosis not present

## 2018-05-13 DIAGNOSIS — Z87891 Personal history of nicotine dependence: Secondary | ICD-10-CM | POA: Insufficient documentation

## 2018-05-13 DIAGNOSIS — N1330 Unspecified hydronephrosis: Secondary | ICD-10-CM

## 2018-05-13 DIAGNOSIS — E7800 Pure hypercholesterolemia, unspecified: Secondary | ICD-10-CM | POA: Insufficient documentation

## 2018-05-13 DIAGNOSIS — I10 Essential (primary) hypertension: Secondary | ICD-10-CM | POA: Insufficient documentation

## 2018-05-13 DIAGNOSIS — Z87442 Personal history of urinary calculi: Secondary | ICD-10-CM | POA: Insufficient documentation

## 2018-05-13 DIAGNOSIS — I4892 Unspecified atrial flutter: Secondary | ICD-10-CM | POA: Diagnosis not present

## 2018-05-13 DIAGNOSIS — Z79899 Other long term (current) drug therapy: Secondary | ICD-10-CM | POA: Diagnosis not present

## 2018-05-13 DIAGNOSIS — C7951 Secondary malignant neoplasm of bone: Secondary | ICD-10-CM | POA: Insufficient documentation

## 2018-05-13 HISTORY — PX: CYSTOSCOPY W/ RETROGRADES: SHX1426

## 2018-05-13 HISTORY — PX: CYSTOSCOPY W/ URETERAL STENT PLACEMENT: SHX1429

## 2018-05-13 LAB — GLUCOSE, CAPILLARY: Glucose-Capillary: 135 mg/dL — ABNORMAL HIGH (ref 70–99)

## 2018-05-13 SURGERY — CYSTOSCOPY, FLEXIBLE, WITH STENT REPLACEMENT
Anesthesia: General | Laterality: Left

## 2018-05-13 MED ORDER — CEFAZOLIN SODIUM-DEXTROSE 2-4 GM/100ML-% IV SOLN
INTRAVENOUS | Status: AC
  Filled 2018-05-13: qty 100

## 2018-05-13 MED ORDER — ONDANSETRON HCL 4 MG/2ML IJ SOLN
INTRAMUSCULAR | Status: DC | PRN
  Administered 2018-05-13: 4 mg via INTRAVENOUS

## 2018-05-13 MED ORDER — ONDANSETRON HCL 4 MG/2ML IJ SOLN
INTRAMUSCULAR | Status: AC
  Filled 2018-05-13: qty 2

## 2018-05-13 MED ORDER — SODIUM CHLORIDE 0.9 % IV SOLN
INTRAVENOUS | Status: DC
  Administered 2018-05-13: 08:00:00 via INTRAVENOUS

## 2018-05-13 MED ORDER — PHENYLEPHRINE HCL 10 MG/ML IJ SOLN
INTRAMUSCULAR | Status: AC
  Filled 2018-05-13: qty 1

## 2018-05-13 MED ORDER — PROPOFOL 10 MG/ML IV BOLUS
INTRAVENOUS | Status: AC
  Filled 2018-05-13: qty 20

## 2018-05-13 MED ORDER — DEXAMETHASONE SODIUM PHOSPHATE 10 MG/ML IJ SOLN
INTRAMUSCULAR | Status: DC | PRN
  Administered 2018-05-13: 5 mg via INTRAVENOUS

## 2018-05-13 MED ORDER — OXYCODONE HCL 5 MG PO TABS: 5.0000 mg | ORAL_TABLET | Freq: Once | ORAL | Status: DC | PRN

## 2018-05-13 MED ORDER — OXYBUTYNIN CHLORIDE ER 15 MG PO TB24: 15.0000 mg | ORAL_TABLET | Freq: Every day | ORAL | 0 refills | Status: DC

## 2018-05-13 MED ORDER — LIDOCAINE HCL (CARDIAC) PF 100 MG/5ML IV SOSY
PREFILLED_SYRINGE | INTRAVENOUS | Status: DC | PRN
  Administered 2018-05-13: 80 mg via INTRAVENOUS

## 2018-05-13 MED ORDER — LIDOCAINE HCL (PF) 2 % IJ SOLN
INTRAMUSCULAR | Status: AC
  Filled 2018-05-13: qty 10

## 2018-05-13 MED ORDER — PROPOFOL 10 MG/ML IV BOLUS
INTRAVENOUS | Status: DC | PRN
  Administered 2018-05-13: 150 mg via INTRAVENOUS

## 2018-05-13 MED ORDER — OXYCODONE HCL 5 MG/5ML PO SOLN: 5.0000 mg | Freq: Once | ORAL | Status: DC | PRN

## 2018-05-13 MED ORDER — FENTANYL CITRATE (PF) 100 MCG/2ML IJ SOLN
INTRAMUSCULAR | Status: AC
  Filled 2018-05-13: qty 2

## 2018-05-13 MED ORDER — DEXAMETHASONE SODIUM PHOSPHATE 10 MG/ML IJ SOLN
INTRAMUSCULAR | Status: AC
  Filled 2018-05-13: qty 1

## 2018-05-13 MED ORDER — EPHEDRINE SULFATE 50 MG/ML IJ SOLN
INTRAMUSCULAR | Status: AC
  Filled 2018-05-13: qty 1

## 2018-05-13 MED ORDER — GLYCOPYRROLATE 0.2 MG/ML IJ SOLN
INTRAMUSCULAR | Status: AC
  Filled 2018-05-13: qty 1

## 2018-05-13 MED ORDER — FENTANYL CITRATE (PF) 100 MCG/2ML IJ SOLN: 25.0000 ug | INTRAMUSCULAR | Status: DC | PRN

## 2018-05-13 MED ORDER — FENTANYL CITRATE (PF) 100 MCG/2ML IJ SOLN
INTRAMUSCULAR | Status: DC | PRN
  Administered 2018-05-13: 50 ug via INTRAVENOUS

## 2018-05-13 MED ORDER — IOPAMIDOL (ISOVUE-200) INJECTION 41%
INTRAVENOUS | Status: DC | PRN
  Administered 2018-05-13: 5 mL via INTRAVENOUS

## 2018-05-13 SURGICAL SUPPLY — 20 items
BAG DRAIN CYSTO-URO LG1000N (MISCELLANEOUS) ×3 IMPLANT
BRUSH SCRUB EZ  4% CHG (MISCELLANEOUS) ×2
BRUSH SCRUB EZ 4% CHG (MISCELLANEOUS) ×1 IMPLANT
CATH URETL 5X70 OPEN END (CATHETERS) ×3 IMPLANT
CONRAY 43 FOR UROLOGY 50M (MISCELLANEOUS) IMPLANT
DRAPE UTILITY 15X26 TOWEL STRL (DRAPES) ×3 IMPLANT
GLOVE BIO SURGEON STRL SZ8 (GLOVE) ×3 IMPLANT
GOWN STRL REUS W/ TWL LRG LVL3 (GOWN DISPOSABLE) ×1 IMPLANT
GOWN STRL REUS W/TWL LRG LVL3 (GOWN DISPOSABLE) ×3
GOWN STRL REUS W/TWL XL LVL3 (GOWN DISPOSABLE) ×3 IMPLANT
KIT TURNOVER CYSTO (KITS) ×3 IMPLANT
PACK CYSTO AR (MISCELLANEOUS) ×3 IMPLANT
SENSORWIRE 0.038 NOT ANGLED (WIRE) ×3
SET CYSTO W/LG BORE CLAMP LF (SET/KITS/TRAYS/PACK) ×3 IMPLANT
SOL .9 NS 3000ML IRR  AL (IV SOLUTION) ×2
SOL .9 NS 3000ML IRR UROMATIC (IV SOLUTION) ×1 IMPLANT
STENT URO INLAY 6FRX26CM (STENTS) ×3 IMPLANT
SURGILUBE 2OZ TUBE FLIPTOP (MISCELLANEOUS) ×3 IMPLANT
WATER STERILE IRR 1000ML POUR (IV SOLUTION) ×3 IMPLANT
WIRE SENSOR 0.038 NOT ANGLED (WIRE) ×1 IMPLANT

## 2018-05-13 NOTE — Transfer of Care (Signed)
Immediate Anesthesia Transfer of Care Note  Patient: [name]  Procedure(s) Performed: Procedure(s): CYSTOSCOPY WITH STENT Exchange (Left) CYSTOSCOPY WITH RETROGRADE PYELOGRAM (Left)  Patient Location: PACU  Anesthesia Type:General  Level of Consciousness: sedated  Airway & Oxygen Therapy: Patient Spontanous Breathing and Patient connected to face mask oxygen  Post-op Assessment: Report given to RN and Post -op Vital signs reviewed and stable  Post vital signs: Reviewed and stable  Last Vitals:  Vitals:   05/13/18 0721 05/13/18 0909  BP: 127/78 126/73  Pulse: 85 81  Resp:  14  Temp: 36.9 C 36.9 C  SpO2: 573% 220%    Complications: No apparent anesthesia complications

## 2018-05-13 NOTE — Discharge Instructions (Signed)
AMBULATORY SURGERY  °DISCHARGE INSTRUCTIONS ° ° °1) The drugs that you were given will stay in your system until tomorrow so for the next 24 hours you should not: ° °A) Drive an automobile °B) Make any legal decisions °C) Drink any alcoholic beverage ° ° °2) You may resume regular meals tomorrow.  Today it is better to start with liquids and gradually work up to solid foods. ° °You may eat anything you prefer, but it is better to start with liquids, then soup and crackers, and gradually work up to solid foods. ° ° °3) Please notify your doctor immediately if you have any unusual bleeding, trouble breathing, redness and pain at the surgery site, drainage, fever, or pain not relieved by medication. ° ° ° °4) Additional Instructions: ° ° ° ° ° ° ° °Please contact your physician with any problems or Same Day Surgery at [phone], Monday through Friday 6 am to 4 pm, or [place] at [place] Main number at [phone]. °

## 2018-05-13 NOTE — Op Note (Signed)
Preoperative diagnosis:  1. Left ureteral obstruction secondary to metastatic prostate cancer  Postoperative diagnosis:  1. Left ureteral obstruction secondary to metastatic prostate cancer  Procedure:  1. Cystoscopy 2. Left ureteral stent exchange (Optima 6 French/26 cm) 3. Left retrograde pyelography with interpretation   Surgeon: Nicki Reaper C. Mckinze Poirier, M.D.  Anesthesia: General  Complications: None  Intraoperative findings:  Left retrograde pyelogram with mild left hydronephrosis and normal caliber ureter.  EBL: None  Specimens: None  Indication: [name] is a 77 y.o. 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  After reviewing the management options for treatment, he elected to proceed with the above surgical procedure(s). We have discussed the potential benefits and risks of the procedure, side effects of the proposed treatment, the likelihood of the patient achieving the goals of the procedure, and any potential problems that might occur during the procedure or recuperation. Informed consent has been obtained.  Description of procedure: The patient was taken to the operating room and general anesthesia was induced.  The patient was placed in the dorsal lithotomy position, prepped and draped in the usual sterile fashion, and preoperative antibiotics were administered. A preoperative time-out was performed.   A 21 French cystoscope was lubricated and passed per urethra. The urethra was normal in caliber without stricture. The prostate was surgically absent. As the bladder was emptied the ureteral stent was easily visualized. There were no bladder mucosal lesions noted.  The stent was not encrustedand was grasped with endoscopic forceps and brought out through the urethral  meatus.   A 0.038 Sensor wire was placed through the stent and advanced to the left renal pelvis under fluoroscopic guidance.  The old stent was removed and a 6 [place] open-ended ureteral catheter was placed over the guidewire and the guidewire was removed. Retrograde pyelogram was performed with findings as described above. The guidewire was replaced and the ureteral catheter was removed. A 6 French/26 cm Bard Optima ureteral  stent was then placed without difficulty. There was good positioning noted proximally and distally. The bladder was emptied and the cystoscope was removed.  After anesthetic reversal he was transported to PACU in stable condition.  [name], M.D.

## 2018-05-13 NOTE — Anesthesia Procedure Notes (Signed)
Procedure Name: LMA Insertion Date/Time: 05/13/2018 8:42 AM Performed by: Doreen Salvage, CRNA Pre-anesthesia Checklist: Patient identified, Patient being monitored, Timeout performed, Emergency Drugs available and Suction available Patient Re-evaluated:Patient Re-evaluated prior to induction Oxygen Delivery Method: Circle system utilized Preoxygenation: Pre-oxygenation with 100% oxygen Induction Type: IV induction Ventilation: Mask ventilation without difficulty LMA: LMA inserted LMA Size: 5.0 Tube type: Oral Number of attempts: 1 Placement Confirmation: positive ETCO2 and breath sounds checked- equal and bilateral Tube secured with: Tape Dental Injury: Teeth and Oropharynx as per pre-operative assessment

## 2018-05-13 NOTE — Anesthesia Postprocedure Evaluation (Signed)
Anesthesia Post Note  Patient: [name]  Procedure(s) Performed: CYSTOSCOPY WITH STENT Exchange (Left ) CYSTOSCOPY WITH RETROGRADE PYELOGRAM (Left )  Patient location during evaluation: PACU Anesthesia Type: General Level of consciousness: awake and alert Pain management: pain level controlled Vital Signs Assessment: post-procedure vital signs reviewed and stable Respiratory status: spontaneous breathing, nonlabored ventilation, respiratory function stable and patient connected to nasal cannula oxygen Cardiovascular status: blood pressure returned to baseline and stable Postop Assessment: no apparent nausea or vomiting Anesthetic complications: no     Last Vitals:  Vitals:   05/13/18 0943 05/13/18 0951  BP: 133/64 137/88  Pulse: 81 84  Resp: 18 16  Temp: 36.4 C (!) 36.2 C  SpO2: 100% 100%    Last Pain:  Vitals:   05/13/18 0951  TempSrc: Temporal  PainSc: 0-No pain                 Precious Haws Piscitello

## 2018-05-13 NOTE — Interval H&P Note (Signed)
History and Physical Interval Note:  05/13/2018 8:19 AM  [name]  has presented today for surgery, with the diagnosis of hydronephrosis secondary to prostate cancer  The various methods of treatment have been discussed with the patient and family. After consideration of risks, benefits and other options for treatment, the patient has consented to  Procedure(s): CYSTOSCOPY WITH STENT Exchange (Left) [name] (Left) as a surgical intervention .  The patient's history has been reviewed, patient examined, no change in status, stable for surgery.  I have reviewed the patient's chart and labs.  Questions were answered to the patient's satisfaction.     [place]

## 2018-05-13 NOTE — H&P (Signed)
05/13/2018 7:15 AM   [name] 27-Sep-1940 [id number]  Referring provider: No referring provider defined for this encounter.   HPI: 77 year old male with metastatic CRPC followed in radiation and medical oncology.  He is presently on Lupron, [place].  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  He presents for stent exchange.   PMH: Past Medical History:  Diagnosis Date  . Aortic stenosis, moderate 01/30/2014  . Arthritis   . Atrial flutter ([place]) 02/02/2014  . Diabetes mellitus without complication (HCC)    diet control  . GERD (gastroesophageal reflux disease)   . Heart murmur   . Heart murmur   . History of kidney stones   . HTN (hypertension) 01/30/2014  . Hypercholesteremia   . Hypertension   . Prostate cancer ([place])    metastatic  . Weakness of left leg     Surgical History: Past Surgical History:  Procedure Laterality Date  . CATARACT EXTRACTION W/ INTRAOCULAR LENS IMPLANT Bilateral   . COLON RESECTION    . CYSTOSCOPY W/ RETROGRADES Left 06/08/2015   Procedure: CYSTOSCOPY WITH RETROGRADE PYELOGRAM;  Surgeon: Nickie Retort, MD;  Location: [facility];  Service: Urology;  Laterality: Left;  . CYSTOSCOPY W/ URETERAL STENT PLACEMENT Left 10/05/2015   Procedure: CYSTOSCOPY WITH STENT REPLACEMENT;  Surgeon: Nickie Retort, MD;  Location: [facility];  Service: Urology;  Laterality: Left;  . CYSTOSCOPY W/ URETERAL STENT PLACEMENT Left 01/04/2016   Procedure: CYSTOSCOPY WITH STENT REPLACEMENT;  Surgeon: Nickie Retort, MD;  Location: [facility];  Service: Urology;  Laterality: Left;  . CYSTOSCOPY W/ URETERAL STENT PLACEMENT Left 04/11/2016   Procedure: CYSTOSCOPY WITH STENT REPLACEMENT;  Surgeon: Nickie Retort, MD;  Location: [facility];  Service: Urology;  Laterality: Left;  . CYSTOSCOPY W/ URETERAL STENT PLACEMENT Left 07/18/2016   Procedure: CYSTOSCOPY WITH STENT  REPLACEMENT;  Surgeon: Nickie Retort, MD;  Location: [facility];  Service: Urology;  Laterality: Left;  . CYSTOSCOPY W/ URETERAL STENT PLACEMENT Left 11/02/2016   Procedure: CYSTOSCOPY WITH STENT REPLACEMENT;  Surgeon: Nickie Retort, MD;  Location: [facility];  Service: Urology;  Laterality: Left;  . CYSTOSCOPY W/ URETERAL STENT PLACEMENT Left 02/08/2017   Procedure: CYSTOSCOPY WITH STENT REPLACEMENT;  Surgeon: Nickie Retort, MD;  Location: [facility];  Service: Urology;  Laterality: Left;  . CYSTOSCOPY W/ URETERAL STENT PLACEMENT Left 05/10/2017   Procedure: CYSTOSCOPY WITH STENT REPLACEMENT;  Surgeon: Nickie Retort, MD;  Location: [facility];  Service: Urology;  Laterality: Left;  . CYSTOSCOPY WITH STENT PLACEMENT Left 08/16/2017   Procedure: CYSTOSCOPY WITH STENT EXCHANGE;  Surgeon: Abbie Sons, MD;  Location: [facility];  Service: Urology;  Laterality: Left;  . CYSTOSCOPY WITH STENT PLACEMENT Left 12/17/2017   Procedure: CYSTOSCOPY WITH STENT EXCHANGE;  Surgeon: Abbie Sons, MD;  Location: [facility];  Service: Urology;  Laterality: Left;  . EYE SURGERY Bilateral Sept and Oct.2012   cataract  . HERNIA REPAIR    . PROSTATECTOMY  1994    Home Medications:  Reviewed  Allergies: No Known Allergies  Family History: Family History  Problem Relation Age of Onset  . Aneurysm Father   . Cancer Mother        breast    Social History:  reports that he quit smoking about 44 years ago. His smoking use included cigarettes. He has a 37.50 pack-year smoking history. He has never used smokeless tobacco. He  reports that he does not drink alcohol or use drugs.  ROS: Noncontributory except as per the HPI  Physical Exam: There were no vitals taken for this visit.  Constitutional:  Alert and oriented, No acute distress. HEENT: [place] AT, moist mucus membranes.  Trachea midline, no masses. Cardiovascular: No clubbing, cyanosis, or edema.  RRR Respiratory: Normal respiratory effort, no  increased work of breathing.  Lungs clear GI: Abdomen is soft, nontender, nondistended, no abdominal masses GU: No CVA tenderness Lymph: No cervical or inguinal lymphadenopathy. Skin: No rashes, bruises or suspicious lesions. Neurologic: Grossly intact, no focal deficits, moving all 4 extremities. Psychiatric: Normal mood and affect.  Laboratory Data: Lab Results  Component Value Date   WBC 7.0 04/30/2018   HGB 11.9 (L) 04/30/2018   HCT 35.6 (L) 04/30/2018   MCV 87.3 04/30/2018   PLT 178 04/30/2018    Lab Results  Component Value Date   CREATININE 1.25 (H) 04/23/2018    Lab Results  Component Value Date   PSA 0.10 10/19/2016   PSA 0.02 06/28/2016   PSA 0.06 03/07/2016    Lab Results  Component Value Date   TESTOSTERONE <3 (L) 10/20/2015    Lab Results  Component Value Date   HGBA1C 5.5 01/30/2014    Urinalysis    Component Value Date/Time   COLORURINE YELLOW (A) 08/09/2017 0905   APPEARANCEUR CLEAR (A) 08/09/2017 0905   APPEARANCEUR Clear 04/06/2016 1126   LABSPEC 1.019 08/09/2017 0905   PHURINE 5.0 08/09/2017 0905   GLUCOSEU NEGATIVE 08/09/2017 0905   HGBUR NEGATIVE 08/09/2017 0905   BILIRUBINUR NEGATIVE 08/09/2017 0905   BILIRUBINUR Negative 04/06/2016 1126   KETONESUR NEGATIVE 08/09/2017 0905   PROTEINUR NEGATIVE 08/09/2017 0905   UROBILINOGEN 0.2 01/13/2014 2146   NITRITE NEGATIVE 08/09/2017 0905   LEUKOCYTESUR NEGATIVE 08/09/2017 0905   LEUKOCYTESUR Negative 04/06/2016 1126    Lab Results  Component Value Date   LABMICR See below: 04/06/2016   WBCUA 0-5 04/06/2016   RBCUA None seen 04/06/2016   LABEPIT 0-10 04/06/2016   MUCUS Present (A) 04/06/2016   BACTERIA NONE SEEN 06/11/2017    Assessment & Plan:   77 year old male with metastatic CRPC who presents for stent removal/exchange.  The procedure has been discussed in detail including potential risks of bleeding and infection.  He desires to proceed.  Abbie Sons, [place] [address], [place], [place] [address] [phone]

## 2018-05-13 NOTE — Anesthesia Preprocedure Evaluation (Signed)
Anesthesia Evaluation  Patient identified by MRN, date of birth, ID band Patient awake    Reviewed: Allergy & Precautions, H&P , NPO status , Patient's Chart, lab work & pertinent test results  History of Anesthesia Complications Negative for: history of anesthetic complications  Airway Mallampati: III  TM Distance: <3 FB Neck ROM: limited    Dental  (+) Poor Dentition, Missing, Lower Dentures, Upper Dentures   Pulmonary neg shortness of breath, pneumonia, former smoker,           Cardiovascular Exercise Tolerance: Good hypertension, (-) angina+ CAD and + Past MI  + Valvular Problems/Murmurs      Neuro/Psych negative neurological ROS  negative psych ROS   GI/Hepatic Neg liver ROS, GERD  Medicated and Controlled,  Endo/Other  diabetes, Type 2  Renal/GU Renal disease     Musculoskeletal  (+) Arthritis ,   Abdominal   Peds  Hematology negative hematology ROS (+)   Anesthesia Other Findings Past Medical History: 01/30/2014: Aortic stenosis, moderate No date: Arthritis 02/02/2014: Atrial flutter (HCC) No date: Diabetes mellitus without complication (HCC)     Comment:  diet control No date: GERD (gastroesophageal reflux disease) No date: Heart murmur No date: Heart murmur No date: History of kidney stones 01/30/2014: HTN (hypertension) No date: Hypercholesteremia No date: Hypertension No date: Prostate cancer ([place])     Comment:  metastatic No date: Weakness of left leg  Past Surgical History: No date: CATARACT EXTRACTION W/ INTRAOCULAR LENS IMPLANT; Bilateral No date: COLON RESECTION 06/08/2015: CYSTOSCOPY W/ RETROGRADES; Left     Comment:  Procedure: CYSTOSCOPY WITH RETROGRADE PYELOGRAM;                Surgeon: Nickie Retort, MD;  Location: [facility];                Service: Urology;  Laterality: Left; 10/05/2015: CYSTOSCOPY W/ URETERAL STENT PLACEMENT; Left     Comment:  Procedure: CYSTOSCOPY WITH STENT  REPLACEMENT;  Surgeon:               Nickie Retort, MD;  Location: [facility];  Service:               Urology;  Laterality: Left; 01/04/2016: CYSTOSCOPY W/ URETERAL STENT PLACEMENT; Left     Comment:  Procedure: CYSTOSCOPY WITH STENT REPLACEMENT;  Surgeon:               Nickie Retort, MD;  Location: [facility];  Service:               Urology;  Laterality: Left; 04/11/2016: CYSTOSCOPY W/ URETERAL STENT PLACEMENT; Left     Comment:  Procedure: CYSTOSCOPY WITH STENT REPLACEMENT;  Surgeon:               Nickie Retort, MD;  Location: [facility];  Service:               Urology;  Laterality: Left; 07/18/2016: CYSTOSCOPY W/ URETERAL STENT PLACEMENT; Left     Comment:  Procedure: CYSTOSCOPY WITH STENT REPLACEMENT;  Surgeon:               Nickie Retort, MD;  Location: [facility];  Service:               Urology;  Laterality: Left; 11/02/2016: CYSTOSCOPY W/ URETERAL STENT PLACEMENT; Left     Comment:  Procedure: CYSTOSCOPY WITH STENT REPLACEMENT;  Surgeon:  Nickie Retort, MD;  Location: [facility];  Service:               Urology;  Laterality: Left; 02/08/2017: CYSTOSCOPY W/ URETERAL STENT PLACEMENT; Left     Comment:  Procedure: CYSTOSCOPY WITH STENT REPLACEMENT;  Surgeon:               Nickie Retort, MD;  Location: [facility];  Service:               Urology;  Laterality: Left; 05/10/2017: CYSTOSCOPY W/ URETERAL STENT PLACEMENT; Left     Comment:  Procedure: CYSTOSCOPY WITH STENT REPLACEMENT;  Surgeon:               Nickie Retort, MD;  Location: [facility];  Service:               Urology;  Laterality: Left; 08/16/2017: CYSTOSCOPY WITH STENT PLACEMENT; Left     Comment:  Procedure: CYSTOSCOPY WITH STENT EXCHANGE;  Surgeon:               Abbie Sons, MD;  Location: [facility];  Service:               Urology;  Laterality: Left; 12/17/2017: CYSTOSCOPY WITH STENT PLACEMENT; Left     Comment:  Procedure: CYSTOSCOPY WITH STENT EXCHANGE;  Surgeon:                Abbie Sons, MD;  Location: [facility];  Service:               Urology;  Laterality: Left; Sept and Oct.2012: EYE SURGERY; Bilateral     Comment:  cataract No date: HERNIA REPAIR 1994: PROSTATECTOMY     Reproductive/Obstetrics negative OB ROS                             Anesthesia Physical Anesthesia Plan  ASA: III  Anesthesia Plan: General LMA   Post-op Pain Management:    Induction: Intravenous  PONV Risk Score and Plan: Ondansetron, Dexamethasone, Midazolam and Treatment may vary due to age or medical condition  Airway Management Planned: LMA  Additional Equipment:   Intra-op Plan:   Post-operative Plan: Extubation in OR  Informed Consent: I have reviewed the patients History and Physical, chart, labs and discussed the procedure including the risks, benefits and alternatives for the proposed anesthesia with the patient or authorized representative who has indicated his/her understanding and acceptance.   Dental Advisory Given  Plan Discussed with: Anesthesiologist, CRNA and Surgeon  Anesthesia Plan Comments: (Patient consented for risks of anesthesia including but not limited to:  - adverse reactions to medications - damage to teeth, lips or other oral mucosa - sore throat or hoarseness - Damage to heart, brain, lungs or loss of life  Patient voiced understanding.)        Anesthesia Quick Evaluation

## 2018-05-13 NOTE — Anesthesia Post-op Follow-up Note (Signed)
Anesthesia QCDR form completed.        

## 2018-05-14 ENCOUNTER — Encounter: Payer: Self-pay | Admitting: Urology

## 2018-05-16 ENCOUNTER — Telehealth: Payer: Self-pay | Admitting: Internal Medicine

## 2018-05-16 ENCOUNTER — Telehealth: Payer: Self-pay | Admitting: *Deleted

## 2018-05-16 NOTE — Telephone Encounter (Signed)
md to reach out to patient's wife.

## 2018-05-16 NOTE — Telephone Encounter (Signed)
#   Spoke to pt re: results of CT scan/bone scan-no evidence of any obvious progression noted;  no obvious reason for his neck pain noted.  # Please schedule appointment with me on October 10th morning when he comes for labs with radiation oncology. Thx.

## 2018-05-16 NOTE — Telephone Encounter (Signed)
Patient called asking for results from last week and also asking when he is to come back to see Dr Rogue Bussing, he does not have follow up appointment scheduled Please advise.  IMPRESSION: 1. No evidence of new skeletal metastasis. 2. Stable metastatic lesions to the ribs. 3. Two new foci of uptake in the anteromedial LEFT ribs are favored posttraumatic.   Electronically Signed   By: Suzy Bouchard M.D.   On: 05/08/2018 09:55

## 2018-05-19 ENCOUNTER — Other Ambulatory Visit: Payer: Self-pay | Admitting: *Deleted

## 2018-05-19 DIAGNOSIS — C7951 Secondary malignant neoplasm of bone: Principal | ICD-10-CM

## 2018-05-19 DIAGNOSIS — C61 Malignant neoplasm of prostate: Secondary | ICD-10-CM

## 2018-05-27 ENCOUNTER — Other Ambulatory Visit: Payer: Self-pay | Admitting: Radiation Oncology

## 2018-05-27 DIAGNOSIS — C61 Malignant neoplasm of prostate: Secondary | ICD-10-CM

## 2018-05-27 DIAGNOSIS — C7951 Secondary malignant neoplasm of bone: Principal | ICD-10-CM

## 2018-05-29 ENCOUNTER — Inpatient Hospital Stay: Payer: Medicare PPO | Attending: Internal Medicine

## 2018-05-29 ENCOUNTER — Inpatient Hospital Stay: Payer: Medicare PPO

## 2018-05-29 ENCOUNTER — Inpatient Hospital Stay (HOSPITAL_BASED_OUTPATIENT_CLINIC_OR_DEPARTMENT_OTHER): Payer: Medicare PPO | Admitting: Internal Medicine

## 2018-05-29 ENCOUNTER — Encounter: Payer: Self-pay | Admitting: *Deleted

## 2018-05-29 VITALS — Wt 182.0 lb

## 2018-05-29 DIAGNOSIS — E1122 Type 2 diabetes mellitus with diabetic chronic kidney disease: Secondary | ICD-10-CM | POA: Insufficient documentation

## 2018-05-29 DIAGNOSIS — I129 Hypertensive chronic kidney disease with stage 1 through stage 4 chronic kidney disease, or unspecified chronic kidney disease: Secondary | ICD-10-CM | POA: Diagnosis not present

## 2018-05-29 DIAGNOSIS — Z923 Personal history of irradiation: Secondary | ICD-10-CM | POA: Diagnosis not present

## 2018-05-29 DIAGNOSIS — M25551 Pain in right hip: Secondary | ICD-10-CM | POA: Insufficient documentation

## 2018-05-29 DIAGNOSIS — C7951 Secondary malignant neoplasm of bone: Secondary | ICD-10-CM | POA: Insufficient documentation

## 2018-05-29 DIAGNOSIS — N183 Chronic kidney disease, stage 3 (moderate): Secondary | ICD-10-CM

## 2018-05-29 DIAGNOSIS — Z79899 Other long term (current) drug therapy: Secondary | ICD-10-CM | POA: Diagnosis not present

## 2018-05-29 DIAGNOSIS — M542 Cervicalgia: Secondary | ICD-10-CM | POA: Diagnosis not present

## 2018-05-29 DIAGNOSIS — C61 Malignant neoplasm of prostate: Secondary | ICD-10-CM | POA: Diagnosis present

## 2018-05-29 LAB — CBC WITH DIFFERENTIAL/PLATELET
ABS IMMATURE GRANULOCYTES: 0.04 10*3/uL (ref 0.00–0.07)
Basophils Absolute: 0.1 10*3/uL (ref 0.0–0.1)
Basophils Relative: 1 %
EOS PCT: 8 %
Eosinophils Absolute: 0.6 10*3/uL — ABNORMAL HIGH (ref 0.0–0.5)
HCT: 36.5 % — ABNORMAL LOW (ref 39.0–52.0)
HEMOGLOBIN: 11.7 g/dL — AB (ref 13.0–17.0)
Immature Granulocytes: 1 %
LYMPHS PCT: 13 %
Lymphs Abs: 1 10*3/uL (ref 0.7–4.0)
MCH: 28.4 pg (ref 26.0–34.0)
MCHC: 32.1 g/dL (ref 30.0–36.0)
MCV: 88.6 fL (ref 80.0–100.0)
MONO ABS: 0.7 10*3/uL (ref 0.1–1.0)
MONOS PCT: 9 %
NEUTROS ABS: 5.2 10*3/uL (ref 1.7–7.7)
Neutrophils Relative %: 68 %
Platelets: 161 10*3/uL (ref 150–400)
RBC: 4.12 MIL/uL — ABNORMAL LOW (ref 4.22–5.81)
RDW: 15.4 % (ref 11.5–15.5)
WBC: 7.7 10*3/uL (ref 4.0–10.5)
nRBC: 0 % (ref 0.0–0.2)

## 2018-05-29 LAB — COMPREHENSIVE METABOLIC PANEL
ALK PHOS: 62 U/L (ref 38–126)
ALT: 13 U/L (ref 0–44)
AST: 21 U/L (ref 15–41)
Albumin: 3.8 g/dL (ref 3.5–5.0)
Anion gap: 9 (ref 5–15)
BUN: 26 mg/dL — ABNORMAL HIGH (ref 8–23)
CALCIUM: 10.5 mg/dL — AB (ref 8.9–10.3)
CO2: 26 mmol/L (ref 22–32)
CREATININE: 1.21 mg/dL (ref 0.61–1.24)
Chloride: 103 mmol/L (ref 98–111)
GFR, EST NON AFRICAN AMERICAN: 56 mL/min — AB (ref >60)
Glucose, Bld: 139 mg/dL — ABNORMAL HIGH (ref 70–99)
Potassium: 4.1 mmol/L (ref 3.5–5.1)
Sodium: 138 mmol/L (ref 135–145)
Total Bilirubin: 0.5 mg/dL (ref 0.3–1.2)
Total Protein: 6.5 g/dL (ref 6.5–8.1)

## 2018-05-29 LAB — PSA: PROSTATIC SPECIFIC ANTIGEN: 3.97 ng/mL (ref 0.00–4.00)

## 2018-05-29 MED ORDER — FENTANYL 50 MCG/HR TD PT72: 50.0000 ug | MEDICATED_PATCH | TRANSDERMAL | 0 refills | Status: DC

## 2018-05-29 MED ORDER — HYDROCODONE-ACETAMINOPHEN 5-325 MG PO TABS: 1.0000 | ORAL_TABLET | Freq: Three times a day (TID) | ORAL | 0 refills | Status: DC | PRN

## 2018-05-29 NOTE — Progress Notes (Signed)
[name] OFFICE PROGRESS NOTE  Patient Care Team: Tamsen Roers, MD as PCP - General (Family Medicine)  Cancer Staging No matching staging information was found for the patient.   Oncology History   # AUG 2016- METASTATIC PROSTATE CA/ STAGE IV; Castrate sensitive [PSA- 48] ; mets- lumbar spine [s/p pal RT to Aug 2016; Dr.Crystal] /Pelvic LN; 1994- Prostate CA s/p Surgery [facility]]; Lupron q4 M; MARCH 2017- Bone scan- L4/Right pubic rami uptake; PSA- 0.1/testosterone-castrate [<3]  # Bone lesions on denosumab;DEC 2016- Recm q 41M  # NOV 2018- CASTRATE RESISTANT; declined chemo; 08/06/2017- start Zytiga+prednisone; Stopped April 24th [rising PSA];  # may 2019- Xofigo   # Left hydronephrosis s/p stenting [Dr.Budzyn] --------------------------------------------   DIAGNOSIS: [ ]  castrate resistant prostate cancer  STAGE:  IV       ;GOALS: palliative  CURRENT/MOST RECENT THERAPY [ 5/29/2019[name]      Prostate cancer metastatic to bone [facility])      INTERVAL HISTORY:  [name] 77 y.o.  male pleasant patient above history of castrate resistant prostate cancer is here for follow-up.  Patient is currently on Xofigo with radiation oncology.  Patient's neck stiffness is improved.  Continues to complain of chronic pain in the hip back which is overall stable.  Is currently on fentanyl patches.  He ran out of his narcotic pain prescriptions.  Is taking Tylenol instead.  Constipation stable.  Review of Systems  Constitutional: Positive for malaise/fatigue. Negative for chills, diaphoresis, fever and weight loss.  HENT: Negative for nosebleeds and sore throat.   Eyes: Negative for double vision.  Respiratory: Negative for cough, hemoptysis, sputum production, shortness of breath and wheezing.   Cardiovascular: Negative for chest pain, palpitations, orthopnea and leg swelling.  Gastrointestinal: Negative for abdominal pain, blood in stool, constipation, diarrhea,  heartburn, melena, nausea and vomiting.  Genitourinary: Negative for dysuria, frequency and urgency.  Musculoskeletal: Positive for back pain, joint pain and neck pain.  Skin: Negative.  Negative for itching and rash.  Neurological: Negative for dizziness, tingling, focal weakness, weakness and headaches.  Endo/Heme/Allergies: Does not bruise/bleed easily.  Psychiatric/Behavioral: Negative for depression. The patient is not nervous/anxious and does not have insomnia.       PAST MEDICAL HISTORY :  Past Medical History:  Diagnosis Date  . Aortic stenosis, moderate 01/30/2014  . Arthritis   . Atrial flutter ([place]) 02/02/2014  . Diabetes mellitus without complication (HCC)    diet control  . GERD (gastroesophageal reflux disease)   . Heart murmur   . Heart murmur   . History of kidney stones   . HTN (hypertension) 01/30/2014  . Hypercholesteremia   . Hypertension   . Prostate cancer ([place])    metastatic  . Weakness of left leg     PAST SURGICAL HISTORY :   Past Surgical History:  Procedure Laterality Date  . CATARACT EXTRACTION W/ INTRAOCULAR LENS IMPLANT Bilateral   . COLON RESECTION    . CYSTOSCOPY W/ RETROGRADES Left 06/08/2015   Procedure: CYSTOSCOPY WITH RETROGRADE PYELOGRAM;  Surgeon: Nickie Retort, MD;  Location: [facility];  Service: Urology;  Laterality: Left;  . CYSTOSCOPY W/ RETROGRADES Left 05/13/2018   Procedure: CYSTOSCOPY WITH RETROGRADE PYELOGRAM;  Surgeon: Abbie Sons, MD;  Location: [facility];  Service: Urology;  Laterality: Left;  . CYSTOSCOPY W/ URETERAL STENT PLACEMENT Left 10/05/2015   Procedure: CYSTOSCOPY WITH STENT REPLACEMENT;  Surgeon: Nickie Retort, MD;  Location: [facility];  Service: Urology;  Laterality: Left;  .  CYSTOSCOPY W/ URETERAL STENT PLACEMENT Left 01/04/2016   Procedure: CYSTOSCOPY WITH STENT REPLACEMENT;  Surgeon: Nickie Retort, MD;  Location: [facility];  Service: Urology;  Laterality: Left;  . CYSTOSCOPY W/ URETERAL STENT  PLACEMENT Left 04/11/2016   Procedure: CYSTOSCOPY WITH STENT REPLACEMENT;  Surgeon: Nickie Retort, MD;  Location: [facility];  Service: Urology;  Laterality: Left;  . CYSTOSCOPY W/ URETERAL STENT PLACEMENT Left 07/18/2016   Procedure: CYSTOSCOPY WITH STENT REPLACEMENT;  Surgeon: Nickie Retort, MD;  Location: [facility];  Service: Urology;  Laterality: Left;  . CYSTOSCOPY W/ URETERAL STENT PLACEMENT Left 11/02/2016   Procedure: CYSTOSCOPY WITH STENT REPLACEMENT;  Surgeon: Nickie Retort, MD;  Location: [facility];  Service: Urology;  Laterality: Left;  . CYSTOSCOPY W/ URETERAL STENT PLACEMENT Left 02/08/2017   Procedure: CYSTOSCOPY WITH STENT REPLACEMENT;  Surgeon: Nickie Retort, MD;  Location: [facility];  Service: Urology;  Laterality: Left;  . CYSTOSCOPY W/ URETERAL STENT PLACEMENT Left 05/10/2017   Procedure: CYSTOSCOPY WITH STENT REPLACEMENT;  Surgeon: Nickie Retort, MD;  Location: [facility];  Service: Urology;  Laterality: Left;  . CYSTOSCOPY W/ URETERAL STENT PLACEMENT Left 05/13/2018   Procedure: CYSTOSCOPY WITH STENT Exchange;  Surgeon: Abbie Sons, MD;  Location: [facility];  Service: Urology;  Laterality: Left;  . CYSTOSCOPY WITH STENT PLACEMENT Left 08/16/2017   Procedure: CYSTOSCOPY WITH STENT EXCHANGE;  Surgeon: Abbie Sons, MD;  Location: [facility];  Service: Urology;  Laterality: Left;  . CYSTOSCOPY WITH STENT PLACEMENT Left 12/17/2017   Procedure: CYSTOSCOPY WITH STENT EXCHANGE;  Surgeon: Abbie Sons, MD;  Location: [facility];  Service: Urology;  Laterality: Left;  . EYE SURGERY Bilateral Sept and Oct.2012   cataract  . HERNIA REPAIR    . PROSTATECTOMY  1994    FAMILY HISTORY :   Family History  Problem Relation Age of Onset  . Aneurysm Father   . Cancer Mother        breast    SOCIAL HISTORY:   Social History   Tobacco Use  . Smoking status: Former Smoker    Packs/day: 1.50    Years: 25.00    Pack years: 37.50    Types: Cigarettes    Last  attempt to quit: 01/21/1974    Years since quitting: 44.3  . Smokeless tobacco: Never Used  Substance Use Topics  . Alcohol use: No  . Drug use: No    ALLERGIES:  has No Known Allergies.  MEDICATIONS:  Current Outpatient Medications  Medication Sig Dispense Refill  . allopurinol (ZYLOPRIM) 300 MG tablet Take 300 mg by mouth daily.     Marland Kitchen aspirin 81 MG tablet Take 81 mg by mouth daily.     Marland Kitchen atorvastatin (LIPITOR) 10 MG tablet Take 10 mg by mouth daily with supper.     . carvedilol (COREG) 6.25 MG tablet Take 1 tablet (6.25 mg total) by mouth 2 (two) times daily with a meal. 60 tablet 2  . chlorthalidone (HYGROTON) 25 MG tablet Take 25 mg by mouth daily.     . diazepam (VALIUM) 5 MG tablet One pill 45-60 mins prior to CT SCAN; 1 pill 15 mins prior if needed. 4 tablet 0  . fentaNYL (DURAGESIC - DOSED MCG/HR) 50 MCG/HR Place 1 patch (50 mcg total) onto the skin every 3 (three) days. 10 patch 0  . ferrous sulfate 325 (65 FE) MG tablet Take 325 mg by mouth daily with breakfast.     . hydrochlorothiazide (MICROZIDE)  12.5 MG capsule Take 12.5 mg by mouth daily.    Marland Kitchen HYDROcodone-acetaminophen (NORCO/VICODIN) 5-325 MG tablet Take 1 tablet by mouth every 8 (eight) hours as needed for severe pain. 90 tablet 0  . oxybutynin (DITROPAN XL) 15 MG 24 hr tablet Take 1 tablet (15 mg total) by mouth at bedtime. 90 tablet 0  . pantoprazole (PROTONIX) 40 MG tablet Take 1 tablet (40 mg total) by mouth daily before breakfast. 30 tablet 2  . Radium Ra 223 Dichloride (XOFIGO IV) Inject into the vein every 30 (thirty) days.    . ranitidine (ZANTAC) 150 MG tablet Take 150 mg by mouth 2 (two) times daily.     . vitamin B-12 (CYANOCOBALAMIN) 1000 MCG tablet Take 1,000 mcg by mouth daily.    . vitamin C (ASCORBIC ACID) 500 MG tablet Take 500 mg by mouth daily.    . Vitamin D, Ergocalciferol, (DRISDOL) 50000 units CAPS capsule Take 50,000 Units by mouth every Monday.      No current facility-administered medications  for this visit.     PHYSICAL EXAMINATION: ECOG PERFORMANCE STATUS: 1 - Symptomatic but completely ambulatory  Wt 182 lb (82.6 kg)   BMI 25.38 kg/m   Filed Weights   05/29/18 1011  Weight: 182 lb (82.6 kg)    Physical Exam  Constitutional: He is oriented to person, place, and time and well-developed, well-nourished, and in no distress.  In wheel chair  HENT:  Head: Normocephalic and atraumatic.  Mouth/Throat: Oropharynx is clear and moist. No oropharyngeal exudate.  Eyes: Pupils are equal, round, and reactive to light.  Neck: Normal range of motion. Neck supple.  Cardiovascular: Normal rate and regular rhythm.  Pulmonary/Chest: No respiratory distress. He has no wheezes.  Abdominal: Soft. Bowel sounds are normal. He exhibits no distension and no mass. There is no tenderness. There is no rebound and no guarding.  Musculoskeletal: He exhibits no edema or tenderness.  Neurological: He is alert and oriented to person, place, and time.  Skin: Skin is warm.  Psychiatric: Affect normal.       LABORATORY DATA:  I have reviewed the data as listed    Component Value Date/Time   NA 137 04/23/2018 1340   K 4.0 04/23/2018 1340   CL 101 04/23/2018 1340   CO2 28 04/23/2018 1340   GLUCOSE 128 (H) 04/23/2018 1340   BUN 24 (H) 04/23/2018 1340   CREATININE 1.25 (H) 04/23/2018 1340   CALCIUM 10.7 (H) 04/23/2018 1340   PROT 7.3 04/23/2018 1340   ALBUMIN 3.8 04/23/2018 1340   AST 24 04/23/2018 1340   ALT 13 04/23/2018 1340   ALKPHOS 59 04/23/2018 1340   BILITOT 0.4 04/23/2018 1340   GFRNONAA 54 (L) 04/23/2018 1340   GFRAA >60 04/23/2018 1340    No results found for: SPEP, UPEP  Lab Results  Component Value Date   WBC 7.7 05/29/2018   NEUTROABS 5.2 05/29/2018   HGB 11.7 (L) 05/29/2018   HCT 36.5 (L) 05/29/2018   MCV 88.6 05/29/2018   PLT 161 05/29/2018      Chemistry      Component Value Date/Time   NA 137 04/23/2018 1340   K 4.0 04/23/2018 1340   CL 101 04/23/2018  1340   CO2 28 04/23/2018 1340   BUN 24 (H) 04/23/2018 1340   CREATININE 1.25 (H) 04/23/2018 1340      Component Value Date/Time   CALCIUM 10.7 (H) 04/23/2018 1340   ALKPHOS 59 04/23/2018 1340   AST 24 04/23/2018  1340   ALT 13 04/23/2018 1340   BILITOT 0.4 04/23/2018 1340       RADIOGRAPHIC STUDIES: I have personally reviewed the radiological images as listed and agreed with the findings in the report. No results found.   ASSESSMENT & PLAN:  Prostate cancer metastatic to bone [facility]) # Metastatic castrate resistant prostate cancer-Lupron q 4 M [8/05] and Xgeva; STABLE. PSA pending today.   # Currently on Xofigo status post cycle #5; currently awaiting #6 on 10/16 [last infusion].  # Neck pain/restrictive movement of the neck-clear etiology. CT- no obvious etiology noted. ? Arthritis.   # Right hip pain- status post radiation; STABLE: continue pain medications; new scripts given today.    #Hypercalcemia calcium 10.7-status post Xgeva today.   # Bone mets-Xgeva q. Monthly.Xgeva today. STABLE.   # CKD- stage III; stable  DISPOSITION:  #  X-geva today # Follow up in 4 weeks/labs-cbc/cmp/psa;X-geva-Dr.B   No orders of the defined types were placed in this encounter.  All questions were answered. The patient knows to call the clinic with any problems, questions or concerns.      Cammie Sickle, MD 05/29/2018 10:52 AM

## 2018-05-29 NOTE — Assessment & Plan Note (Addendum)
#   Metastatic castrate resistant prostate cancer-Lupron q 4 M [8/05] and Xgeva; STABLE. PSA pending today.   # Currently on Xofigo status post cycle #5; currently awaiting #6 on 10/16 [last infusion].  # Neck pain/restrictive movement of the neck-clear etiology. CT- no obvious etiology noted. ? Arthritis.   # Right hip pain- status post radiation; STABLE: continue pain medications; new scripts given today.    #Hypercalcemia calcium 10.7-status post Xgeva today.   # Bone mets-Xgeva q. Monthly.Xgeva today. STABLE.   # CKD- stage III; stable  DISPOSITION:  #  X-geva today # Follow up in 4 weeks/labs-cbc/cmp/psa;X-geva-Dr.B

## 2018-06-03 ENCOUNTER — Other Ambulatory Visit: Payer: Self-pay | Admitting: Internal Medicine

## 2018-06-04 ENCOUNTER — Inpatient Hospital Stay: Payer: Medicare PPO

## 2018-06-04 ENCOUNTER — Ambulatory Visit: Payer: Medicare PPO

## 2018-06-04 ENCOUNTER — Ambulatory Visit: Admission: RE | Admit: 2018-06-04 | Payer: Medicare PPO | Source: Ambulatory Visit | Admitting: Radiation Oncology

## 2018-06-04 ENCOUNTER — Ambulatory Visit
Admission: RE | Admit: 2018-06-04 | Discharge: 2018-06-04 | Disposition: A | Discharge status: Home / Self Care | Payer: Medicare PPO | Source: Ambulatory Visit | Attending: Radiation Oncology | Admitting: Radiation Oncology

## 2018-06-04 DIAGNOSIS — C7951 Secondary malignant neoplasm of bone: Secondary | ICD-10-CM | POA: Insufficient documentation

## 2018-06-04 DIAGNOSIS — C61 Malignant neoplasm of prostate: Secondary | ICD-10-CM | POA: Insufficient documentation

## 2018-06-04 MED ORDER — RADIUM RA 223 DICHLORIDE 30 MCCI/ML IV SOLN
121.9360 | Freq: Once | INTRAVENOUS | Status: AC
  Administered 2018-06-04: 121.936 via INTRAVENOUS

## 2018-06-04 MED ORDER — DENOSUMAB 120 MG/1.7ML ~~LOC~~ SOLN
120.0000 mg | Freq: Once | SUBCUTANEOUS | Status: AC
  Administered 2018-06-04: 120 mg via SUBCUTANEOUS

## 2018-06-26 ENCOUNTER — Ambulatory Visit: Payer: Medicare PPO

## 2018-06-26 ENCOUNTER — Other Ambulatory Visit: Payer: Medicare PPO

## 2018-06-26 ENCOUNTER — Ambulatory Visit: Payer: Medicare PPO | Admitting: Internal Medicine

## 2018-06-26 ENCOUNTER — Other Ambulatory Visit: Payer: Self-pay | Admitting: Student

## 2018-06-26 DIAGNOSIS — M7062 Trochanteric bursitis, left hip: Secondary | ICD-10-CM

## 2018-06-26 DIAGNOSIS — C7951 Secondary malignant neoplasm of bone: Principal | ICD-10-CM

## 2018-06-26 DIAGNOSIS — C61 Malignant neoplasm of prostate: Secondary | ICD-10-CM

## 2018-07-02 ENCOUNTER — Other Ambulatory Visit: Payer: Self-pay | Admitting: *Deleted

## 2018-07-02 ENCOUNTER — Inpatient Hospital Stay: Payer: Medicare PPO | Attending: Internal Medicine

## 2018-07-02 ENCOUNTER — Telehealth: Payer: Self-pay | Admitting: Pharmacy Technician

## 2018-07-02 ENCOUNTER — Inpatient Hospital Stay (HOSPITAL_BASED_OUTPATIENT_CLINIC_OR_DEPARTMENT_OTHER): Payer: Medicare PPO | Admitting: Internal Medicine

## 2018-07-02 ENCOUNTER — Encounter: Payer: Self-pay | Admitting: Internal Medicine

## 2018-07-02 ENCOUNTER — Inpatient Hospital Stay: Payer: Medicare PPO

## 2018-07-02 ENCOUNTER — Telehealth: Payer: Self-pay | Admitting: Pharmacist

## 2018-07-02 ENCOUNTER — Other Ambulatory Visit: Payer: Self-pay

## 2018-07-02 VITALS — BP 115/73 | HR 72 | Temp 98.2°F | Resp 16 | Ht 71.0 in | Wt 181.6 lb

## 2018-07-02 DIAGNOSIS — Z192 Hormone resistant malignancy status: Secondary | ICD-10-CM | POA: Diagnosis not present

## 2018-07-02 DIAGNOSIS — Z87442 Personal history of urinary calculi: Secondary | ICD-10-CM

## 2018-07-02 DIAGNOSIS — E7800 Pure hypercholesterolemia, unspecified: Secondary | ICD-10-CM

## 2018-07-02 DIAGNOSIS — Z803 Family history of malignant neoplasm of breast: Secondary | ICD-10-CM | POA: Diagnosis not present

## 2018-07-02 DIAGNOSIS — Z923 Personal history of irradiation: Secondary | ICD-10-CM

## 2018-07-02 DIAGNOSIS — I129 Hypertensive chronic kidney disease with stage 1 through stage 4 chronic kidney disease, or unspecified chronic kidney disease: Secondary | ICD-10-CM | POA: Insufficient documentation

## 2018-07-02 DIAGNOSIS — R531 Weakness: Secondary | ICD-10-CM

## 2018-07-02 DIAGNOSIS — Z7982 Long term (current) use of aspirin: Secondary | ICD-10-CM

## 2018-07-02 DIAGNOSIS — C7951 Secondary malignant neoplasm of bone: Secondary | ICD-10-CM

## 2018-07-02 DIAGNOSIS — K5909 Other constipation: Secondary | ICD-10-CM

## 2018-07-02 DIAGNOSIS — C61 Malignant neoplasm of prostate: Secondary | ICD-10-CM | POA: Insufficient documentation

## 2018-07-02 DIAGNOSIS — Z79899 Other long term (current) drug therapy: Secondary | ICD-10-CM | POA: Insufficient documentation

## 2018-07-02 DIAGNOSIS — I10 Essential (primary) hypertension: Secondary | ICD-10-CM | POA: Diagnosis not present

## 2018-07-02 DIAGNOSIS — G8929 Other chronic pain: Secondary | ICD-10-CM | POA: Insufficient documentation

## 2018-07-02 DIAGNOSIS — M25551 Pain in right hip: Secondary | ICD-10-CM | POA: Insufficient documentation

## 2018-07-02 DIAGNOSIS — I350 Nonrheumatic aortic (valve) stenosis: Secondary | ICD-10-CM

## 2018-07-02 DIAGNOSIS — M549 Dorsalgia, unspecified: Secondary | ICD-10-CM

## 2018-07-02 DIAGNOSIS — N183 Chronic kidney disease, stage 3 (moderate): Secondary | ICD-10-CM | POA: Insufficient documentation

## 2018-07-02 DIAGNOSIS — Z87891 Personal history of nicotine dependence: Secondary | ICD-10-CM | POA: Insufficient documentation

## 2018-07-02 DIAGNOSIS — I4892 Unspecified atrial flutter: Secondary | ICD-10-CM

## 2018-07-02 DIAGNOSIS — K219 Gastro-esophageal reflux disease without esophagitis: Secondary | ICD-10-CM | POA: Diagnosis not present

## 2018-07-02 DIAGNOSIS — R5383 Other fatigue: Secondary | ICD-10-CM

## 2018-07-02 DIAGNOSIS — M1990 Unspecified osteoarthritis, unspecified site: Secondary | ICD-10-CM | POA: Diagnosis not present

## 2018-07-02 DIAGNOSIS — E1122 Type 2 diabetes mellitus with diabetic chronic kidney disease: Secondary | ICD-10-CM

## 2018-07-02 LAB — CBC WITH DIFFERENTIAL/PLATELET
Abs Immature Granulocytes: 0.03 10*3/uL (ref 0.00–0.07)
Basophils Absolute: 0.1 10*3/uL (ref 0.0–0.1)
Basophils Relative: 1 %
EOS ABS: 0.5 10*3/uL (ref 0.0–0.5)
Eosinophils Relative: 6 %
HEMATOCRIT: 35.2 % — AB (ref 39.0–52.0)
Hemoglobin: 11.2 g/dL — ABNORMAL LOW (ref 13.0–17.0)
Immature Granulocytes: 0 %
LYMPHS ABS: 0.9 10*3/uL (ref 0.7–4.0)
LYMPHS PCT: 11 %
MCH: 28.8 pg (ref 26.0–34.0)
MCHC: 31.8 g/dL (ref 30.0–36.0)
MCV: 90.5 fL (ref 80.0–100.0)
MONO ABS: 0.7 10*3/uL (ref 0.1–1.0)
Monocytes Relative: 9 %
NRBC: 0 % (ref 0.0–0.2)
Neutro Abs: 6.1 10*3/uL (ref 1.7–7.7)
Neutrophils Relative %: 73 %
Platelets: 169 10*3/uL (ref 150–400)
RBC: 3.89 MIL/uL — ABNORMAL LOW (ref 4.22–5.81)
RDW: 15.2 % (ref 11.5–15.5)
WBC: 8.3 10*3/uL (ref 4.0–10.5)

## 2018-07-02 LAB — COMPREHENSIVE METABOLIC PANEL
ALBUMIN: 3.8 g/dL (ref 3.5–5.0)
ALK PHOS: 62 U/L (ref 38–126)
ALT: 14 U/L (ref 0–44)
ANION GAP: 9 (ref 5–15)
AST: 22 U/L (ref 15–41)
BILIRUBIN TOTAL: 0.3 mg/dL (ref 0.3–1.2)
BUN: 26 mg/dL — ABNORMAL HIGH (ref 8–23)
CO2: 26 mmol/L (ref 22–32)
CREATININE: 1.4 mg/dL — AB (ref 0.61–1.24)
Calcium: 10.2 mg/dL (ref 8.9–10.3)
Chloride: 104 mmol/L (ref 98–111)
GFR calc non Af Amer: 47 mL/min — ABNORMAL LOW (ref >60)
GFR, EST AFRICAN AMERICAN: 55 mL/min — AB (ref >60)
GLUCOSE: 154 mg/dL — AB (ref 70–99)
Potassium: 3.9 mmol/L (ref 3.5–5.1)
SODIUM: 139 mmol/L (ref 135–145)
TOTAL PROTEIN: 6.9 g/dL (ref 6.5–8.1)

## 2018-07-02 LAB — PSA: Prostatic Specific Antigen: 3.87 ng/mL (ref 0.00–4.00)

## 2018-07-02 MED ORDER — HYDROCODONE-ACETAMINOPHEN 5-325 MG PO TABS: 1.0000 | ORAL_TABLET | Freq: Three times a day (TID) | ORAL | 0 refills | Status: DC | PRN

## 2018-07-02 MED ORDER — FENTANYL 50 MCG/HR TD PT72: 50.0000 ug | MEDICATED_PATCH | TRANSDERMAL | 0 refills | Status: DC

## 2018-07-02 MED ORDER — ENZALUTAMIDE 40 MG PO CAPS: 160.0000 mg | ORAL_CAPSULE | Freq: Every day | ORAL | 4 refills | Status: DC

## 2018-07-02 MED ORDER — DENOSUMAB 120 MG/1.7ML ~~LOC~~ SOLN
120.0000 mg | Freq: Once | SUBCUTANEOUS | Status: AC
  Administered 2018-07-02: 120 mg via SUBCUTANEOUS
  Filled 2018-07-02: qty 1.7

## 2018-07-02 NOTE — Telephone Encounter (Signed)
Oral Oncology [facility] Encounter  After completing a benefits investigation, the [facility] has an existing prior authorization for Xtandi that will expire on 07/07/18.  I will submit another prior authorization at that time.    Patients copay is $1915.  I have obtained a grant through the [facility] in the amount of $6500 to help reduce the patients out of pocket expense to $0.  The billing information is below and has been shared with the [facility].  BIN: [id number] PCN: [id number] ID: [id number] Group: [id number]   [name] Phone [phone] Fax [phone] 07/02/2018 10:48 AM

## 2018-07-02 NOTE — Progress Notes (Signed)
Patient reports severe back pain. He states it is chronic and is worse with ambulation.

## 2018-07-02 NOTE — Telephone Encounter (Signed)
Oral Oncology Pharmacist Encounter  Received new prescription for Xtandi (enzalutamide) for the treatment of Metastatic castrate resistant prostate cancer in conjunction with Lupron injections, planned duration until disease progression or unacceptable drug toxicity.  BP from 07/02/18 assessed, BP well controlled. Prescription dose and frequency assessed.   Current medication list in Epic reviewed, several DDIs with Gillermina Phy identified: - Xtandi may decrease the concentrations of the following medication: fentanyl, atorvastatin, diazepam, hydrocodone/APAP, pantoprazole. Monitor patient for decreases effectiveness of the listed medication. I asked the patient to keep [place] up to date on his pain control.   Prescription has been e-scribed to the [facility] for benefits analysis and approval.  Patient education Counseled patient on administration, dosing, side effects, monitoring, drug-food interactions, safe handling, storage, and disposal. Patient will take 4 capsules (160 mg total) by mouth daily.  Side effects include but not limited to: fatigue, HTN, hot flashes.    Reviewed with patient importance of keeping a medication schedule and plan for any missed doses.  [name] voiced understanding and appreciation. All questions answered. Medication handout provided and consent obtained.  Oral Oncology Clinic will continue to follow for insurance authorization, copayment issues, and start date.  Provided patient with [facility] phone number. Patient knows to call the office with questions or concerns. Oral Chemotherapy [facility] will continue to follow.  Darl Pikes, PharmD, BCPS, [facility] Hematology/Oncology Clinical Pharmacist [facility] [phone]  07/02/2018 11:31 AM

## 2018-07-02 NOTE — Assessment & Plan Note (Addendum)
#   Metastatic castrate resistant prostate cancer-Lupron q 4 M [8/05] and Xgeva;Marland Kitchen PSA- Oct 2019- 3.97/stable.  Xofigo status post cycle #6- 10/16 [last infusion]. Sep 2019- bone scan- stable.   # Discussed re: X-tandi.  Discussed re: fatigue/small risk of seizures. Pt agrees. Discussed with Alysson.  Can start when available.  Discussed treatments are palliative not curative.  # Right hip pain- status post radiation; stable.continue pain medications- fentanyl/  Hydrocodone  new scripts given today.    # Bone mets-Xgeva q. Monthly.Xgeva today. STABLE.   # CKD- stage III;creat 1.4/ STABLE.   DISPOSITION:  #  X-geva today # Follow up in 4 weeks/labs-cbc/cmp/psa;X-geva/Lupron-Dr.B

## 2018-07-02 NOTE — Progress Notes (Signed)
[place] OFFICE PROGRESS NOTE  Patient Care Team: Tamsen Roers, MD as PCP - General (Family Medicine)  Cancer Staging No matching staging information was found for the patient.   Oncology History   # AUG 2016- METASTATIC PROSTATE CA/ STAGE IV; Castrate sensitive [PSA- 48] ; mets- lumbar spine [s/p pal RT to Aug 2016; Dr.Crystal] /Pelvic LN; 1994- Prostate CA s/p Surgery [facility]]; Lupron q4 M; MARCH 2017- Bone scan- L4/Right pubic rami uptake; PSA- 0.1/testosterone-castrate [<3]  # Bone lesions on denosumab;DEC 2016- Recm q 37M  # NOV 2018- CASTRATE RESISTANT; declined chemo; 08/06/2017- start Zytiga+prednisone; Stopped April 24th [rising PSA];  # may 2019- Xofigo #6 on 10/16]  # Left hydronephrosis s/p stenting [Dr.Budzyn] --------------------------------------------   DIAGNOSIS: [ ]  castrate resistant prostate cancer  STAGE:  IV       ;GOALS: palliative  CURRENT/MOST RECENT THERAPY [ 01/15/2018- Xofigo      Prostate cancer metastatic to bone [facility])      INTERVAL HISTORY:  [name] 77 y.o.  male pleasant patient above history of castrate resistant prostate cancer is here for follow-up.  Patient currently status post Xofigo cycle #6 approximately 1 month ago.  He continues to have chronic back pain pain.  Not any worse.  Overall stable.  Continues to be on the fentanyl patch/hydrocodone 3 times a day.  Chronic constipation stable.  Not any worse.  Review of Systems  Constitutional: Positive for malaise/fatigue. Negative for chills, diaphoresis, fever and weight loss.  HENT: Negative for nosebleeds and sore throat.   Eyes: Negative for double vision.  Respiratory: Negative for cough, hemoptysis, sputum production, shortness of breath and wheezing.   Cardiovascular: Negative for chest pain, palpitations, orthopnea and leg swelling.  Gastrointestinal: Negative for abdominal pain, blood in stool, constipation, diarrhea, heartburn, melena, nausea and  vomiting.  Genitourinary: Negative for dysuria, frequency and urgency.  Musculoskeletal: Positive for back pain, joint pain and neck pain.  Skin: Negative.  Negative for itching and rash.  Neurological: Negative for dizziness, tingling, focal weakness, weakness and headaches.  Endo/Heme/Allergies: Does not bruise/bleed easily.  Psychiatric/Behavioral: Negative for depression. The patient is not nervous/anxious and does not have insomnia.       PAST MEDICAL HISTORY :  Past Medical History:  Diagnosis Date  . Aortic stenosis, moderate 01/30/2014  . Arthritis   . Atrial flutter ([place]) 02/02/2014  . Diabetes mellitus without complication (HCC)    diet control  . GERD (gastroesophageal reflux disease)   . Heart murmur   . Heart murmur   . History of kidney stones   . HTN (hypertension) 01/30/2014  . Hypercholesteremia   . Hypertension   . Prostate cancer ([place])    metastatic  . Weakness of left leg     PAST SURGICAL HISTORY :   Past Surgical History:  Procedure Laterality Date  . CATARACT EXTRACTION W/ INTRAOCULAR LENS IMPLANT Bilateral   . COLON RESECTION    . CYSTOSCOPY W/ RETROGRADES Left 06/08/2015   Procedure: CYSTOSCOPY WITH RETROGRADE PYELOGRAM;  Surgeon: Nickie Retort, MD;  Location: [facility];  Service: Urology;  Laterality: Left;  . CYSTOSCOPY W/ RETROGRADES Left 05/13/2018   Procedure: CYSTOSCOPY WITH RETROGRADE PYELOGRAM;  Surgeon: Abbie Sons, MD;  Location: [facility];  Service: Urology;  Laterality: Left;  . CYSTOSCOPY W/ URETERAL STENT PLACEMENT Left 10/05/2015   Procedure: CYSTOSCOPY WITH STENT REPLACEMENT;  Surgeon: Nickie Retort, MD;  Location: [facility];  Service: Urology;  Laterality: Left;  . CYSTOSCOPY  W/ URETERAL STENT PLACEMENT Left 01/04/2016   Procedure: CYSTOSCOPY WITH STENT REPLACEMENT;  Surgeon: Nickie Retort, MD;  Location: [facility];  Service: Urology;  Laterality: Left;  . CYSTOSCOPY W/ URETERAL STENT PLACEMENT Left 04/11/2016   Procedure:  CYSTOSCOPY WITH STENT REPLACEMENT;  Surgeon: Nickie Retort, MD;  Location: [facility];  Service: Urology;  Laterality: Left;  . CYSTOSCOPY W/ URETERAL STENT PLACEMENT Left 07/18/2016   Procedure: CYSTOSCOPY WITH STENT REPLACEMENT;  Surgeon: Nickie Retort, MD;  Location: [facility];  Service: Urology;  Laterality: Left;  . CYSTOSCOPY W/ URETERAL STENT PLACEMENT Left 11/02/2016   Procedure: CYSTOSCOPY WITH STENT REPLACEMENT;  Surgeon: Nickie Retort, MD;  Location: [facility];  Service: Urology;  Laterality: Left;  . CYSTOSCOPY W/ URETERAL STENT PLACEMENT Left 02/08/2017   Procedure: CYSTOSCOPY WITH STENT REPLACEMENT;  Surgeon: Nickie Retort, MD;  Location: [facility];  Service: Urology;  Laterality: Left;  . CYSTOSCOPY W/ URETERAL STENT PLACEMENT Left 05/10/2017   Procedure: CYSTOSCOPY WITH STENT REPLACEMENT;  Surgeon: Nickie Retort, MD;  Location: [facility];  Service: Urology;  Laterality: Left;  . CYSTOSCOPY W/ URETERAL STENT PLACEMENT Left 05/13/2018   Procedure: CYSTOSCOPY WITH STENT Exchange;  Surgeon: Abbie Sons, MD;  Location: [facility];  Service: Urology;  Laterality: Left;  . CYSTOSCOPY WITH STENT PLACEMENT Left 08/16/2017   Procedure: CYSTOSCOPY WITH STENT EXCHANGE;  Surgeon: Abbie Sons, MD;  Location: [facility];  Service: Urology;  Laterality: Left;  . CYSTOSCOPY WITH STENT PLACEMENT Left 12/17/2017   Procedure: CYSTOSCOPY WITH STENT EXCHANGE;  Surgeon: Abbie Sons, MD;  Location: [facility];  Service: Urology;  Laterality: Left;  . EYE SURGERY Bilateral Sept and Oct.2012   cataract  . HERNIA REPAIR    . PROSTATECTOMY  1994    FAMILY HISTORY :   Family History  Problem Relation Age of Onset  . Aneurysm Father   . Cancer Mother        breast    SOCIAL HISTORY:   Social History   Tobacco Use  . Smoking status: Former Smoker    Packs/day: 1.50    Years: 25.00    Pack years: 37.50    Types: Cigarettes    Last attempt to quit: 01/21/1974    Years  since quitting: 44.4  . Smokeless tobacco: Never Used  Substance Use Topics  . Alcohol use: No  . Drug use: No    ALLERGIES:  has No Known Allergies.  MEDICATIONS:  Current Outpatient Medications  Medication Sig Dispense Refill  . allopurinol (ZYLOPRIM) 300 MG tablet Take 300 mg by mouth daily.     Marland Kitchen aspirin 81 MG tablet Take 81 mg by mouth daily.     Marland Kitchen atorvastatin (LIPITOR) 10 MG tablet Take 10 mg by mouth daily with supper.     . carvedilol (COREG) 6.25 MG tablet Take 1 tablet (6.25 mg total) by mouth 2 (two) times daily with a meal. 60 tablet 2  . chlorthalidone (HYGROTON) 25 MG tablet Take 25 mg by mouth daily.     . diazepam (VALIUM) 5 MG tablet One pill 45-60 mins prior to CT SCAN; 1 pill 15 mins prior if needed. 4 tablet 0  . fentaNYL (DURAGESIC - DOSED MCG/HR) 50 MCG/HR Place 1 patch (50 mcg total) onto the skin every 3 (three) days. 10 patch 0  . ferrous sulfate 325 (65 FE) MG tablet Take 325 mg by mouth daily with breakfast.     . hydrochlorothiazide (MICROZIDE) 12.5  MG capsule Take 12.5 mg by mouth daily.    Marland Kitchen HYDROcodone-acetaminophen (NORCO/VICODIN) 5-325 MG tablet Take 1 tablet by mouth every 8 (eight) hours as needed for severe pain. 90 tablet 0  . oxybutynin (DITROPAN XL) 15 MG 24 hr tablet Take 1 tablet (15 mg total) by mouth at bedtime. 90 tablet 0  . pantoprazole (PROTONIX) 40 MG tablet Take 1 tablet (40 mg total) by mouth daily before breakfast. 30 tablet 2  . Radium Ra 223 Dichloride (XOFIGO IV) Inject into the vein every 30 (thirty) days.    . ranitidine (ZANTAC) 150 MG tablet Take 150 mg by mouth 2 (two) times daily.     . vitamin B-12 (CYANOCOBALAMIN) 1000 MCG tablet Take 1,000 mcg by mouth daily.    . vitamin C (ASCORBIC ACID) 500 MG tablet Take 500 mg by mouth daily.    . Vitamin D, Ergocalciferol, (DRISDOL) 50000 units CAPS capsule Take 50,000 Units by mouth every Monday.     . enzalutamide (XTANDI) 40 MG capsule Take 4 capsules (160 mg total) by mouth daily.  120 capsule 4   No current facility-administered medications for this visit.     PHYSICAL EXAMINATION: ECOG PERFORMANCE STATUS: 1 - Symptomatic but completely ambulatory  BP 115/73 (BP Location: Left Arm, Patient Position: Sitting)   Pulse 72   Temp 98.2 F (36.8 C)   Resp 16   Ht 5\' 11"  (1.803 m)   Wt 181 lb 9.6 oz (82.4 kg)   BMI 25.33 kg/m   Filed Weights   07/02/18 0910  Weight: 181 lb 9.6 oz (82.4 kg)    Physical Exam  Constitutional: He is oriented to person, place, and time and well-developed, well-nourished, and in no distress.  In wheel chair.  Accompanied by his wife.  HENT:  Head: Normocephalic and atraumatic.  Mouth/Throat: Oropharynx is clear and moist. No oropharyngeal exudate.  Eyes: Pupils are equal, round, and reactive to light.  Neck: Normal range of motion. Neck supple.  Cardiovascular: Normal rate and regular rhythm.  Pulmonary/Chest: No respiratory distress. He has no wheezes.  Abdominal: Soft. Bowel sounds are normal. He exhibits no distension and no mass. There is no tenderness. There is no rebound and no guarding.  Musculoskeletal: He exhibits no edema or tenderness.  Neurological: He is alert and oriented to person, place, and time.  Skin: Skin is warm.  Psychiatric: Affect normal.       LABORATORY DATA:  I have reviewed the data as listed    Component Value Date/Time   NA 139 07/02/2018 0856   K 3.9 07/02/2018 0856   CL 104 07/02/2018 0856   CO2 26 07/02/2018 0856   GLUCOSE 154 (H) 07/02/2018 0856   BUN 26 (H) 07/02/2018 0856   CREATININE 1.40 (H) 07/02/2018 0856   CALCIUM 10.2 07/02/2018 0856   PROT 6.9 07/02/2018 0856   ALBUMIN 3.8 07/02/2018 0856   AST 22 07/02/2018 0856   ALT 14 07/02/2018 0856   ALKPHOS 62 07/02/2018 0856   BILITOT 0.3 07/02/2018 0856   GFRNONAA 47 (L) 07/02/2018 0856   GFRAA 55 (L) 07/02/2018 0856    No results found for: SPEP, UPEP  Lab Results  Component Value Date   WBC 8.3 07/02/2018   NEUTROABS  6.1 07/02/2018   HGB 11.2 (L) 07/02/2018   HCT 35.2 (L) 07/02/2018   MCV 90.5 07/02/2018   PLT 169 07/02/2018      Chemistry      Component Value Date/Time   NA 139  07/02/2018 0856   K 3.9 07/02/2018 0856   CL 104 07/02/2018 0856   CO2 26 07/02/2018 0856   BUN 26 (H) 07/02/2018 0856   CREATININE 1.40 (H) 07/02/2018 0856      Component Value Date/Time   CALCIUM 10.2 07/02/2018 0856   ALKPHOS 62 07/02/2018 0856   AST 22 07/02/2018 0856   ALT 14 07/02/2018 0856   BILITOT 0.3 07/02/2018 0856       RADIOGRAPHIC STUDIES: I have personally reviewed the radiological images as listed and agreed with the findings in the report. No results found.   ASSESSMENT & PLAN:  Prostate cancer metastatic to bone [facility]) # Metastatic castrate resistant prostate cancer-Lupron q 4 M [8/05] and Xgeva;Marland Kitchen PSA- Oct 2019- 3.97/stable.  Xofigo status post cycle #6- 10/16 [last infusion]. Sep 2019- bone scan- stable.   # Discussed re: X-tandi.  Discussed re: fatigue/small risk of seizures. Pt agrees. Discussed with Alysson.  Can start when available.   # Right hip pain- status post radiation; stable.continue pain medications- fentanyl/  Hydrocodone  new scripts given today.    # Bone mets-Xgeva q. Monthly.Xgeva today. STABLE.   # CKD- stage III;creat 1.4/ STABLE.   DISPOSITION:  #  X-geva today # Follow up in 4 weeks/labs-cbc/cmp/psa;X-geva/Lupron-Dr.B   No orders of the defined types were placed in this encounter.  All questions were answered. The patient knows to call the clinic with any problems, questions or concerns.      Cammie Sickle, MD 07/02/2018 12:50 PM

## 2018-07-02 NOTE — Telephone Encounter (Signed)
Scheduled medication to be delivered on 07/04/18 from [facility].

## 2018-07-03 MED FILL — XTANDI 40 MG CAPSULE: 40 | 30 days supply | Qty: 120 | Fill #0

## 2018-07-08 DIAGNOSIS — M7062 Trochanteric bursitis, left hip: Secondary | ICD-10-CM | POA: Insufficient documentation

## 2018-07-21 ENCOUNTER — Other Ambulatory Visit: Payer: Self-pay | Admitting: Family Medicine

## 2018-07-21 MED ORDER — OXYBUTYNIN CHLORIDE ER 15 MG PO TB24: 15.0000 mg | ORAL_TABLET | Freq: Every day | ORAL | 3 refills | Status: DC

## 2018-07-25 ENCOUNTER — Telehealth: Payer: Self-pay | Admitting: Pharmacy Technician

## 2018-07-25 NOTE — Telephone Encounter (Signed)
Oral Oncology Patient Advocate Encounter   Patient has a current grant with Patient [employer] worker [facility]).  We can use this grant to provide copayment coverage for Xtandi.  This will keep the out of pocket expense at $0.      The billing information is as follows and has been shared with [employer].   Member ID: [id number] Group ID: [id number] RxBin: [id number] Dates of Eligibility: 11/20/17 through 02/18/19  Current balance is $3127.18 as of 07/25/18.  [place] Patient [place] Phone [phone] Fax [phone] 07/25/2018 9:51 AM

## 2018-07-28 MED FILL — XTANDI 40 MG CAPSULE: 40 | 30 days supply | Qty: 120 | Fill #1

## 2018-07-29 ENCOUNTER — Other Ambulatory Visit: Payer: Self-pay | Admitting: *Deleted

## 2018-07-29 DIAGNOSIS — C7951 Secondary malignant neoplasm of bone: Principal | ICD-10-CM

## 2018-07-29 DIAGNOSIS — C61 Malignant neoplasm of prostate: Secondary | ICD-10-CM

## 2018-07-30 ENCOUNTER — Inpatient Hospital Stay: Payer: Medicare PPO | Attending: Internal Medicine

## 2018-07-30 ENCOUNTER — Other Ambulatory Visit: Payer: Self-pay

## 2018-07-30 ENCOUNTER — Inpatient Hospital Stay: Payer: Medicare PPO

## 2018-07-30 ENCOUNTER — Inpatient Hospital Stay (HOSPITAL_BASED_OUTPATIENT_CLINIC_OR_DEPARTMENT_OTHER): Payer: Medicare PPO | Admitting: Internal Medicine

## 2018-07-30 ENCOUNTER — Encounter: Payer: Self-pay | Admitting: Internal Medicine

## 2018-07-30 VITALS — BP 154/87 | HR 62 | Temp 97.6°F | Resp 20 | Ht 71.0 in | Wt 178.0 lb

## 2018-07-30 DIAGNOSIS — C61 Malignant neoplasm of prostate: Secondary | ICD-10-CM

## 2018-07-30 DIAGNOSIS — Z923 Personal history of irradiation: Secondary | ICD-10-CM | POA: Insufficient documentation

## 2018-07-30 DIAGNOSIS — M129 Arthropathy, unspecified: Secondary | ICD-10-CM | POA: Diagnosis not present

## 2018-07-30 DIAGNOSIS — M7989 Other specified soft tissue disorders: Secondary | ICD-10-CM | POA: Insufficient documentation

## 2018-07-30 DIAGNOSIS — I129 Hypertensive chronic kidney disease with stage 1 through stage 4 chronic kidney disease, or unspecified chronic kidney disease: Secondary | ICD-10-CM | POA: Diagnosis not present

## 2018-07-30 DIAGNOSIS — N183 Chronic kidney disease, stage 3 (moderate): Secondary | ICD-10-CM

## 2018-07-30 DIAGNOSIS — Z87442 Personal history of urinary calculi: Secondary | ICD-10-CM | POA: Diagnosis not present

## 2018-07-30 DIAGNOSIS — M25551 Pain in right hip: Secondary | ICD-10-CM | POA: Insufficient documentation

## 2018-07-30 DIAGNOSIS — Z79899 Other long term (current) drug therapy: Secondary | ICD-10-CM | POA: Diagnosis not present

## 2018-07-30 DIAGNOSIS — E7800 Pure hypercholesterolemia, unspecified: Secondary | ICD-10-CM

## 2018-07-30 DIAGNOSIS — Z803 Family history of malignant neoplasm of breast: Secondary | ICD-10-CM

## 2018-07-30 DIAGNOSIS — C7951 Secondary malignant neoplasm of bone: Secondary | ICD-10-CM | POA: Diagnosis not present

## 2018-07-30 DIAGNOSIS — R351 Nocturia: Secondary | ICD-10-CM

## 2018-07-30 DIAGNOSIS — Z87891 Personal history of nicotine dependence: Secondary | ICD-10-CM | POA: Diagnosis not present

## 2018-07-30 DIAGNOSIS — Z79818 Long term (current) use of other agents affecting estrogen receptors and estrogen levels: Secondary | ICD-10-CM | POA: Diagnosis not present

## 2018-07-30 DIAGNOSIS — I350 Nonrheumatic aortic (valve) stenosis: Secondary | ICD-10-CM

## 2018-07-30 DIAGNOSIS — Z192 Hormone resistant malignancy status: Secondary | ICD-10-CM | POA: Diagnosis not present

## 2018-07-30 DIAGNOSIS — R531 Weakness: Secondary | ICD-10-CM | POA: Diagnosis not present

## 2018-07-30 DIAGNOSIS — K219 Gastro-esophageal reflux disease without esophagitis: Secondary | ICD-10-CM | POA: Diagnosis not present

## 2018-07-30 LAB — COMPREHENSIVE METABOLIC PANEL
ALT: 11 U/L (ref 0–44)
AST: 21 U/L (ref 15–41)
Albumin: 3.7 g/dL (ref 3.5–5.0)
Alkaline Phosphatase: 59 U/L (ref 38–126)
Anion gap: 7 (ref 5–15)
BUN: 29 mg/dL — ABNORMAL HIGH (ref 8–23)
CO2: 26 mmol/L (ref 22–32)
Calcium: 10.2 mg/dL (ref 8.9–10.3)
Chloride: 106 mmol/L (ref 98–111)
Creatinine, Ser: 1.28 mg/dL — ABNORMAL HIGH (ref 0.61–1.24)
GFR calc Af Amer: >60 mL/min (ref >60)
GFR calc non Af Amer: 54 mL/min — ABNORMAL LOW (ref >60)
Glucose, Bld: 134 mg/dL — ABNORMAL HIGH (ref 70–99)
Potassium: 4 mmol/L (ref 3.5–5.1)
SODIUM: 139 mmol/L (ref 135–145)
Total Bilirubin: 0.4 mg/dL (ref 0.3–1.2)
Total Protein: 7 g/dL (ref 6.5–8.1)

## 2018-07-30 LAB — CBC WITH DIFFERENTIAL/PLATELET
ABS IMMATURE GRANULOCYTES: 0.03 10*3/uL (ref 0.00–0.07)
Basophils Absolute: 0.1 10*3/uL (ref 0.0–0.1)
Basophils Relative: 1 %
EOS PCT: 7 %
Eosinophils Absolute: 0.4 10*3/uL (ref 0.0–0.5)
HCT: 35.7 % — ABNORMAL LOW (ref 39.0–52.0)
Hemoglobin: 11.4 g/dL — ABNORMAL LOW (ref 13.0–17.0)
Immature Granulocytes: 1 %
LYMPHS PCT: 14 %
Lymphs Abs: 0.9 10*3/uL (ref 0.7–4.0)
MCH: 28.9 pg (ref 26.0–34.0)
MCHC: 31.9 g/dL (ref 30.0–36.0)
MCV: 90.6 fL (ref 80.0–100.0)
Monocytes Absolute: 0.7 10*3/uL (ref 0.1–1.0)
Monocytes Relative: 11 %
Neutro Abs: 4.3 10*3/uL (ref 1.7–7.7)
Neutrophils Relative %: 66 %
Platelets: 171 10*3/uL (ref 150–400)
RBC: 3.94 MIL/uL — ABNORMAL LOW (ref 4.22–5.81)
RDW: 14.9 % (ref 11.5–15.5)
WBC: 6.5 10*3/uL (ref 4.0–10.5)
nRBC: 0 % (ref 0.0–0.2)

## 2018-07-30 LAB — PSA: Prostatic Specific Antigen: 1.93 ng/mL (ref 0.00–4.00)

## 2018-07-30 MED ORDER — DENOSUMAB 120 MG/1.7ML ~~LOC~~ SOLN
120.0000 mg | Freq: Once | SUBCUTANEOUS | Status: AC
  Administered 2018-07-30: 120 mg via SUBCUTANEOUS
  Filled 2018-07-30: qty 1.7

## 2018-07-30 MED ORDER — LEUPROLIDE ACETATE (4 MONTH) 30 MG IM KIT
30.0000 mg | PACK | Freq: Once | INTRAMUSCULAR | Status: AC
  Administered 2018-07-30: 30 mg via INTRAMUSCULAR
  Filled 2018-07-30: qty 30

## 2018-07-30 MED ORDER — HYDROCODONE-ACETAMINOPHEN 5-325 MG PO TABS: 1.0000 | ORAL_TABLET | Freq: Three times a day (TID) | ORAL | 0 refills | Status: DC | PRN

## 2018-07-30 NOTE — Assessment & Plan Note (Addendum)
#   Metastatic castrate resistant prostate cancer-Lupron q 4 M [8/05] and Xgeva;Marland Kitchen PSA- Oct 2019- 3.97/stable. On Xtandi.  # STABLE; continue X-tandi.   # Right hip pain- status post radiation; STABLE; continue pain medications- fentanyl/  Hydrocodone  new scripts given today.  Awaiting MRI hip on 12/12 [[[facility]]  # Bone mets-Xgeva q. Monthly.Xgeva today. Stable.   # CKD- stage III;creat 1.4/ stable.    # BilLE swelling- Echo in 2017- 55-60%; monitor for now.   # DISPOSITION:  #  X-geva; Lupron today.  # Follow up in 4 weeks/labs-cbc/cmp/psa;-Dr.B

## 2018-07-30 NOTE — Progress Notes (Signed)
[facility] OFFICE PROGRESS NOTE  Patient Care Team: Tamsen Roers, MD as PCP - General (Family Medicine)  Cancer Staging No matching staging information was found for the patient.   Oncology History   # AUG 2016- METASTATIC PROSTATE CA/ STAGE IV; Castrate sensitive [PSA- 48] ; mets- lumbar spine [s/p pal RT to Aug 2016; Dr.Crystal] /Pelvic LN; 1994- Prostate CA s/p Surgery [facility]]; Lupron q4 M; MARCH 2017- Bone scan- L4/Right pubic rami uptake; PSA- 0.1/testosterone-castrate [<3]  # Bone lesions on denosumab;DEC 2016- Recm q 64M  # NOV 2018- CASTRATE RESISTANT; declined chemo; 08/06/2017- start Zytiga+prednisone; Stopped April 24th [rising PSA];  # may 2019- Xofigo #6 on 10/16]  # OCT 16th 2019- X-tandi  # Left hydronephrosis s/p stenting [Dr.Budzyn] --------------------------------------------   DIAGNOSIS: [ ]  castrate resistant prostate cancer  STAGE:  IV       ;GOALS: palliative  CURRENT/MOST RECENT THERAPY ; Xtandi     Prostate cancer metastatic to bone [facility])      INTERVAL HISTORY:  [name] 77 y.o.  male pleasant patient above history of castrate resistant prostate cancer is here for follow-up.  Patient is currently on Xtandi started approximately 3 weeks ago.  Patient denies any seizures.  Denies any extreme fatigue.  Continues to take hydrocodone 3 pills a day.  On fentanyl patch.  Is having an MRI of his left hip-through Ortho this week.  Complains of mild swelling in his bilateral lower extremities.  Review of Systems  Constitutional: Positive for malaise/fatigue. Negative for chills, diaphoresis, fever and weight loss.  HENT: Negative for nosebleeds and sore throat.   Eyes: Negative for double vision.  Respiratory: Negative for cough, hemoptysis, sputum production, shortness of breath and wheezing.   Cardiovascular: Positive for leg swelling. Negative for chest pain, palpitations and orthopnea.  Gastrointestinal: Negative for abdominal  pain, blood in stool, constipation, diarrhea, heartburn, melena, nausea and vomiting.  Genitourinary: Negative for dysuria, frequency and urgency.  Musculoskeletal: Positive for back pain, joint pain and neck pain.  Skin: Negative.  Negative for itching and rash.  Neurological: Negative for dizziness, tingling, focal weakness, weakness and headaches.  Endo/Heme/Allergies: Does not bruise/bleed easily.  Psychiatric/Behavioral: Negative for depression. The patient is not nervous/anxious and does not have insomnia.       PAST MEDICAL HISTORY :  Past Medical History:  Diagnosis Date  . Aortic stenosis, moderate 01/30/2014  . Arthritis   . Atrial flutter ([place]) 02/02/2014  . Diabetes mellitus without complication (HCC)    diet control  . GERD (gastroesophageal reflux disease)   . Heart murmur   . Heart murmur   . History of kidney stones   . HTN (hypertension) 01/30/2014  . Hypercholesteremia   . Hypertension   . Prostate cancer ([place])    metastatic  . Weakness of left leg     PAST SURGICAL HISTORY :   Past Surgical History:  Procedure Laterality Date  . CATARACT EXTRACTION W/ INTRAOCULAR LENS IMPLANT Bilateral   . COLON RESECTION    . CYSTOSCOPY W/ RETROGRADES Left 06/08/2015   Procedure: CYSTOSCOPY WITH RETROGRADE PYELOGRAM;  Surgeon: Nickie Retort, MD;  Location: [facility];  Service: Urology;  Laterality: Left;  . CYSTOSCOPY W/ RETROGRADES Left 05/13/2018   Procedure: CYSTOSCOPY WITH RETROGRADE PYELOGRAM;  Surgeon: Abbie Sons, MD;  Location: [facility];  Service: Urology;  Laterality: Left;  . CYSTOSCOPY W/ URETERAL STENT PLACEMENT Left 10/05/2015   Procedure: CYSTOSCOPY WITH STENT REPLACEMENT;  Surgeon: Nickie Retort,  MD;  Location: [facility];  Service: Urology;  Laterality: Left;  . CYSTOSCOPY W/ URETERAL STENT PLACEMENT Left 01/04/2016   Procedure: CYSTOSCOPY WITH STENT REPLACEMENT;  Surgeon: Nickie Retort, MD;  Location: [facility];  Service: Urology;  Laterality:  Left;  . CYSTOSCOPY W/ URETERAL STENT PLACEMENT Left 04/11/2016   Procedure: CYSTOSCOPY WITH STENT REPLACEMENT;  Surgeon: Nickie Retort, MD;  Location: [facility];  Service: Urology;  Laterality: Left;  . CYSTOSCOPY W/ URETERAL STENT PLACEMENT Left 07/18/2016   Procedure: CYSTOSCOPY WITH STENT REPLACEMENT;  Surgeon: Nickie Retort, MD;  Location: [facility];  Service: Urology;  Laterality: Left;  . CYSTOSCOPY W/ URETERAL STENT PLACEMENT Left 11/02/2016   Procedure: CYSTOSCOPY WITH STENT REPLACEMENT;  Surgeon: Nickie Retort, MD;  Location: [facility];  Service: Urology;  Laterality: Left;  . CYSTOSCOPY W/ URETERAL STENT PLACEMENT Left 02/08/2017   Procedure: CYSTOSCOPY WITH STENT REPLACEMENT;  Surgeon: Nickie Retort, MD;  Location: [facility];  Service: Urology;  Laterality: Left;  . CYSTOSCOPY W/ URETERAL STENT PLACEMENT Left 05/10/2017   Procedure: CYSTOSCOPY WITH STENT REPLACEMENT;  Surgeon: Nickie Retort, MD;  Location: [facility];  Service: Urology;  Laterality: Left;  . CYSTOSCOPY W/ URETERAL STENT PLACEMENT Left 05/13/2018   Procedure: CYSTOSCOPY WITH STENT Exchange;  Surgeon: Abbie Sons, MD;  Location: [facility];  Service: Urology;  Laterality: Left;  . CYSTOSCOPY WITH STENT PLACEMENT Left 08/16/2017   Procedure: CYSTOSCOPY WITH STENT EXCHANGE;  Surgeon: Abbie Sons, MD;  Location: [facility];  Service: Urology;  Laterality: Left;  . CYSTOSCOPY WITH STENT PLACEMENT Left 12/17/2017   Procedure: CYSTOSCOPY WITH STENT EXCHANGE;  Surgeon: Abbie Sons, MD;  Location: [facility];  Service: Urology;  Laterality: Left;  . EYE SURGERY Bilateral Sept and Oct.2012   cataract  . HERNIA REPAIR    . PROSTATECTOMY  1994    FAMILY HISTORY :   Family History  Problem Relation Age of Onset  . Aneurysm Father   . Cancer Mother        breast    SOCIAL HISTORY:   Social History   Tobacco Use  . Smoking status: Former Smoker    Packs/day: 1.50    Years: 25.00    Pack  years: 37.50    Types: Cigarettes    Last attempt to quit: 01/21/1974    Years since quitting: 44.5  . Smokeless tobacco: Never Used  Substance Use Topics  . Alcohol use: No  . Drug use: No    ALLERGIES:  has No Known Allergies.  MEDICATIONS:  Current Outpatient Medications  Medication Sig Dispense Refill  . allopurinol (ZYLOPRIM) 300 MG tablet Take 300 mg by mouth daily.     Marland Kitchen aspirin 81 MG tablet Take 81 mg by mouth daily.     Marland Kitchen atorvastatin (LIPITOR) 10 MG tablet Take 10 mg by mouth daily with supper.     . carvedilol (COREG) 6.25 MG tablet Take 1 tablet (6.25 mg total) by mouth 2 (two) times daily with a meal. 60 tablet 2  . chlorthalidone (HYGROTON) 25 MG tablet Take 25 mg by mouth daily.     . enzalutamide (XTANDI) 40 MG capsule Take 4 capsules (160 mg total) by mouth daily. 120 capsule 4  . fentaNYL (DURAGESIC - DOSED MCG/HR) 50 MCG/HR Place 1 patch (50 mcg total) onto the skin every 3 (three) days. 10 patch 0  . ferrous sulfate 325 (65 FE) MG tablet Take 325 mg by mouth daily with breakfast.     .  hydrochlorothiazide (MICROZIDE) 12.5 MG capsule Take 12.5 mg by mouth daily.    Marland Kitchen HYDROcodone-acetaminophen (NORCO/VICODIN) 5-325 MG tablet Take 1 tablet by mouth every 8 (eight) hours as needed for severe pain. 90 tablet 0  . oxybutynin (DITROPAN XL) 15 MG 24 hr tablet Take 1 tablet (15 mg total) by mouth at bedtime. 90 tablet 3  . pantoprazole (PROTONIX) 40 MG tablet Take 1 tablet (40 mg total) by mouth daily before breakfast. 30 tablet 2  . ranitidine (ZANTAC) 150 MG tablet Take 150 mg by mouth 2 (two) times daily.     . vitamin B-12 (CYANOCOBALAMIN) 1000 MCG tablet Take 1,000 mcg by mouth daily.    . vitamin C (ASCORBIC ACID) 500 MG tablet Take 500 mg by mouth daily.    . Vitamin D, Ergocalciferol, (DRISDOL) 50000 units CAPS capsule Take 50,000 Units by mouth every Monday.     . diazepam (VALIUM) 5 MG tablet One pill 45-60 mins prior to CT SCAN; 1 pill 15 mins prior if needed.  (Patient not taking: Reported on 07/30/2018) 4 tablet 0   No current facility-administered medications for this visit.    Facility-Administered Medications Ordered in Other Visits  Medication Dose Route Frequency Provider Last Rate Last Dose  . denosumab (XGEVA) injection 120 mg  120 mg Subcutaneous Once Charlaine Dalton R, MD      . leuprolide (LUPRON) injection 30 mg  30 mg Intramuscular Once Cammie Sickle, MD        PHYSICAL EXAMINATION: ECOG PERFORMANCE STATUS: 1 - Symptomatic but completely ambulatory  BP (!) 154/87 (BP Location: Left Arm, Patient Position: Sitting)   Pulse 62   Temp 97.6 F (36.4 C) (Oral)   Resp 20   Ht 5\' 11"  (1.803 m)   Wt 178 lb (80.7 kg)   BMI 24.83 kg/m   Filed Weights   07/30/18 0926  Weight: 178 lb (80.7 kg)    Physical Exam  Constitutional: He is oriented to person, place, and time and well-developed, well-nourished, and in no distress.  In wheel chair.  Accompanied by his wife.  HENT:  Head: Normocephalic and atraumatic.  Mouth/Throat: Oropharynx is clear and moist. No oropharyngeal exudate.  Eyes: Pupils are equal, round, and reactive to light.  Neck: Normal range of motion. Neck supple.  Cardiovascular: Normal rate and regular rhythm.  Pulmonary/Chest: No respiratory distress. He has no wheezes.  Abdominal: Soft. Bowel sounds are normal. He exhibits no distension and no mass. There is no tenderness. There is no rebound and no guarding.  Musculoskeletal: He exhibits no edema or tenderness.  Neurological: He is alert and oriented to person, place, and time.  Skin: Skin is warm.  Psychiatric: Affect normal.       LABORATORY DATA:  I have reviewed the data as listed    Component Value Date/Time   NA 139 07/30/2018 0906   K 4.0 07/30/2018 0906   CL 106 07/30/2018 0906   CO2 26 07/30/2018 0906   GLUCOSE 134 (H) 07/30/2018 0906   BUN 29 (H) 07/30/2018 0906   CREATININE 1.28 (H) 07/30/2018 0906   CALCIUM 10.2 07/30/2018  0906   PROT 7.0 07/30/2018 0906   ALBUMIN 3.7 07/30/2018 0906   AST 21 07/30/2018 0906   ALT 11 07/30/2018 0906   ALKPHOS 59 07/30/2018 0906   BILITOT 0.4 07/30/2018 0906   GFRNONAA 54 (L) 07/30/2018 0906   GFRAA >60 07/30/2018 0906    No results found for: SPEP, UPEP  Lab Results  Component  Value Date   WBC 6.5 07/30/2018   NEUTROABS 4.3 07/30/2018   HGB 11.4 (L) 07/30/2018   HCT 35.7 (L) 07/30/2018   MCV 90.6 07/30/2018   PLT 171 07/30/2018      Chemistry      Component Value Date/Time   NA 139 07/30/2018 0906   K 4.0 07/30/2018 0906   CL 106 07/30/2018 0906   CO2 26 07/30/2018 0906   BUN 29 (H) 07/30/2018 0906   CREATININE 1.28 (H) 07/30/2018 0906      Component Value Date/Time   CALCIUM 10.2 07/30/2018 0906   ALKPHOS 59 07/30/2018 0906   AST 21 07/30/2018 0906   ALT 11 07/30/2018 0906   BILITOT 0.4 07/30/2018 0906       RADIOGRAPHIC STUDIES: I have personally reviewed the radiological images as listed and agreed with the findings in the report. No results found.   ASSESSMENT & PLAN:  Prostate cancer metastatic to bone [facility]) # Metastatic castrate resistant prostate cancer-Lupron q 4 M [8/05] and Xgeva;Marland Kitchen PSA- Oct 2019- 3.97/stable. On Xtandi.  # STABLE; continue X-tandi.   # Right hip pain- status post radiation; STABLE; continue pain medications- fentanyl/  Hydrocodone  new scripts given today.  Awaiting MRI hip on 12/12 [[[facility]]  # Bone mets-Xgeva q. Monthly.Xgeva today. Stable.   # CKD- stage III;creat 1.4/ stable.    # BilLE swelling- Echo in 2017- 55-60%; monitor for now.   # DISPOSITION:  #  X-geva; Lupron today.  # Follow up in 4 weeks/labs-cbc/cmp/psa;-Dr.B   No orders of the defined types were placed in this encounter.  All questions were answered. The patient knows to call the clinic with any problems, questions or concerns.      Cammie Sickle, MD 07/30/2018 10:11 AM

## 2018-08-05 ENCOUNTER — Ambulatory Visit
Admission: RE | Admit: 2018-08-05 | Discharge: 2018-08-05 | Disposition: A | Discharge status: Home / Self Care | Payer: Medicare PPO | Source: Ambulatory Visit | Attending: Student | Admitting: Student

## 2018-08-05 DIAGNOSIS — M24852 Other specific joint derangements of left hip, not elsewhere classified: Secondary | ICD-10-CM | POA: Diagnosis not present

## 2018-08-05 DIAGNOSIS — C61 Malignant neoplasm of prostate: Secondary | ICD-10-CM | POA: Diagnosis present

## 2018-08-05 DIAGNOSIS — M24851 Other specific joint derangements of right hip, not elsewhere classified: Secondary | ICD-10-CM | POA: Diagnosis not present

## 2018-08-05 DIAGNOSIS — S43432A Superior glenoid labrum lesion of left shoulder, initial encounter: Secondary | ICD-10-CM | POA: Diagnosis not present

## 2018-08-05 DIAGNOSIS — M7062 Trochanteric bursitis, left hip: Secondary | ICD-10-CM | POA: Insufficient documentation

## 2018-08-05 DIAGNOSIS — C7951 Secondary malignant neoplasm of bone: Secondary | ICD-10-CM | POA: Insufficient documentation

## 2018-08-05 DIAGNOSIS — X58XXXA Exposure to other specified factors, initial encounter: Secondary | ICD-10-CM | POA: Insufficient documentation

## 2018-08-18 ENCOUNTER — Other Ambulatory Visit: Payer: Self-pay | Admitting: Podiatry

## 2018-08-18 ENCOUNTER — Other Ambulatory Visit: Payer: Self-pay

## 2018-08-18 ENCOUNTER — Encounter
Admission: RE | Admit: 2018-08-18 | Discharge: 2018-08-18 | Disposition: A | Discharge status: Home / Self Care | Payer: Medicare PPO | Source: Ambulatory Visit | Attending: Podiatry | Admitting: Podiatry

## 2018-08-18 DIAGNOSIS — I444 Left anterior fascicular block: Secondary | ICD-10-CM | POA: Insufficient documentation

## 2018-08-18 DIAGNOSIS — Z01810 Encounter for preprocedural cardiovascular examination: Secondary | ICD-10-CM | POA: Diagnosis not present

## 2018-08-18 DIAGNOSIS — I10 Essential (primary) hypertension: Secondary | ICD-10-CM | POA: Diagnosis not present

## 2018-08-18 NOTE — Pre-Procedure Instructions (Signed)
EKG COMPARED WITH OLD 

## 2018-08-18 NOTE — Patient Instructions (Signed)
Your procedure is scheduled on: August 29, 2018 Friday Report to Day Surgery on the 2nd floor of the [facility]. To find out your arrival time, please call [phone] between 1PM - 3PM on: August 28, 2008 THURSDAY  REMEMBER: Instructions that are not followed completely may result in serious medical risk, up to and including death; or upon the discretion of your surgeon and anesthesiologist your surgery may need to be rescheduled.  Do not eat food after midnight the night before surgery.  No gum chewing, lozengers or hard candies.  You may however, drink CLEAR liquids up to 2 hours before you are scheduled to arrive for your surgery. Do not drink anything within 2 hours of the start of your surgery.  Clear liquids include: - water   Do NOT drink anything that is not on this list.  Type 1 and Type 2 diabetics should only drink water.  No Alcohol for 24 hours before or after surgery.  No Smoking including e-cigarettes for 24 hours prior to surgery.  No chewable tobacco products for at least 6 hours prior to surgery.  No nicotine patches on the day of surgery.  On the morning of surgery brush your teeth with toothpaste and water, you may rinse your mouth with mouthwash if you wish. Do not swallow any toothpaste or mouthwash.  Notify your doctor if there is any change in your medical condition (cold, fever, infection).  Do not wear jewelry, make-up, hairpins, clips or nail polish.  Do not wear lotions, powders, or perfumes.   Do not shave 48 hours prior to surgery.   Contacts and dentures may not be worn into surgery.  Do not bring valuables to the hospital, including drivers license, insurance or credit cards.  [facility] is not responsible for any belongings or valuables.   TAKE THESE MEDICATIONS THE MORNING OF SURGERY: PANTOPRAZOLE (take one the night before and one on the morning of surgery - helps to prevent nausea after surgery.) MAY KEEP PAIN PATCH ON   ALLOPURINOL CARVEDILOL  ZANTAC  Use CHG Soap  as directed on instruction sheet.  Follow recommendations from Cardiologist, Pulmonologist or PCP regarding stopping Aspirin X 1 WEEK Stop Anti-inflammatories (NSAIDS) such as Advil, Aleve, Ibuprofen, Motrin, Naproxen, Naprosyn and Aspirin based products such as Excedrin, Goodys Powder, BC Powder. (May take Tylenol or Acetaminophen if needed.)  Stop ANY OVER THE COUNTER supplements until after surgery. (May continue Vitamin D, Vitamin B, VIT C AND FERROUS SULFATE and multivitamin.)  Wear comfortable clothing (specific to your surgery type) to the hospital.  Plan for stool softeners for home use.  If you are being discharged the day of surgery, you will not be allowed to drive home. You will need a responsible adult to drive you home and stay with you that night.   If you are taking public transportation, you will need to have a responsible adult with you. Please confirm with your physician that it is acceptable to use public transportation.   Please call [phone] if you have any questions about these instructions.

## 2018-08-19 NOTE — Pre-Procedure Instructions (Signed)
EKG OK BY DR KARENZ 

## 2018-08-26 MED FILL — XTANDI 40 MG CAPSULE: 40 | 30 days supply | Qty: 120 | Fill #2

## 2018-08-28 ENCOUNTER — Telehealth: Payer: Self-pay | Admitting: Urology

## 2018-08-28 ENCOUNTER — Inpatient Hospital Stay: Payer: Medicare HMO

## 2018-08-28 ENCOUNTER — Inpatient Hospital Stay: Payer: Medicare HMO | Attending: Internal Medicine | Admitting: Internal Medicine

## 2018-08-28 ENCOUNTER — Other Ambulatory Visit: Payer: Self-pay

## 2018-08-28 ENCOUNTER — Encounter: Payer: Self-pay | Admitting: Internal Medicine

## 2018-08-28 VITALS — BP 166/88 | HR 70 | Temp 97.6°F | Resp 20 | Ht 71.0 in | Wt 178.0 lb

## 2018-08-28 DIAGNOSIS — Z7982 Long term (current) use of aspirin: Secondary | ICD-10-CM | POA: Insufficient documentation

## 2018-08-28 DIAGNOSIS — M25551 Pain in right hip: Secondary | ICD-10-CM

## 2018-08-28 DIAGNOSIS — M129 Arthropathy, unspecified: Secondary | ICD-10-CM | POA: Insufficient documentation

## 2018-08-28 DIAGNOSIS — N183 Chronic kidney disease, stage 3 (moderate): Secondary | ICD-10-CM | POA: Diagnosis not present

## 2018-08-28 DIAGNOSIS — C7951 Secondary malignant neoplasm of bone: Secondary | ICD-10-CM | POA: Insufficient documentation

## 2018-08-28 DIAGNOSIS — I350 Nonrheumatic aortic (valve) stenosis: Secondary | ICD-10-CM | POA: Insufficient documentation

## 2018-08-28 DIAGNOSIS — K219 Gastro-esophageal reflux disease without esophagitis: Secondary | ICD-10-CM | POA: Insufficient documentation

## 2018-08-28 DIAGNOSIS — I4892 Unspecified atrial flutter: Secondary | ICD-10-CM | POA: Insufficient documentation

## 2018-08-28 DIAGNOSIS — Z79899 Other long term (current) drug therapy: Secondary | ICD-10-CM | POA: Diagnosis not present

## 2018-08-28 DIAGNOSIS — E7800 Pure hypercholesterolemia, unspecified: Secondary | ICD-10-CM | POA: Insufficient documentation

## 2018-08-28 DIAGNOSIS — Z87891 Personal history of nicotine dependence: Secondary | ICD-10-CM | POA: Diagnosis not present

## 2018-08-28 DIAGNOSIS — Z87442 Personal history of urinary calculi: Secondary | ICD-10-CM | POA: Diagnosis not present

## 2018-08-28 DIAGNOSIS — E119 Type 2 diabetes mellitus without complications: Secondary | ICD-10-CM | POA: Diagnosis not present

## 2018-08-28 DIAGNOSIS — I129 Hypertensive chronic kidney disease with stage 1 through stage 4 chronic kidney disease, or unspecified chronic kidney disease: Secondary | ICD-10-CM | POA: Diagnosis not present

## 2018-08-28 DIAGNOSIS — C61 Malignant neoplasm of prostate: Secondary | ICD-10-CM | POA: Diagnosis not present

## 2018-08-28 DIAGNOSIS — Z192 Hormone resistant malignancy status: Secondary | ICD-10-CM | POA: Diagnosis not present

## 2018-08-28 LAB — CBC WITH DIFFERENTIAL/PLATELET
Abs Immature Granulocytes: 0.02 10*3/uL (ref 0.00–0.07)
Basophils Absolute: 0.1 10*3/uL (ref 0.0–0.1)
Basophils Relative: 1 %
Eosinophils Absolute: 0.4 10*3/uL (ref 0.0–0.5)
Eosinophils Relative: 6 %
HCT: 36.3 % — ABNORMAL LOW (ref 39.0–52.0)
Hemoglobin: 11.3 g/dL — ABNORMAL LOW (ref 13.0–17.0)
Immature Granulocytes: 0 %
Lymphocytes Relative: 16 %
Lymphs Abs: 1 10*3/uL (ref 0.7–4.0)
MCH: 29.2 pg (ref 26.0–34.0)
MCHC: 31.1 g/dL (ref 30.0–36.0)
MCV: 93.8 fL (ref 80.0–100.0)
MONOS PCT: 10 %
Monocytes Absolute: 0.6 10*3/uL (ref 0.1–1.0)
NEUTROS PCT: 67 %
Neutro Abs: 4.3 10*3/uL (ref 1.7–7.7)
Platelets: 170 10*3/uL (ref 150–400)
RBC: 3.87 MIL/uL — ABNORMAL LOW (ref 4.22–5.81)
RDW: 15.6 % — ABNORMAL HIGH (ref 11.5–15.5)
WBC: 6.5 10*3/uL (ref 4.0–10.5)
nRBC: 0 % (ref 0.0–0.2)

## 2018-08-28 LAB — COMPREHENSIVE METABOLIC PANEL
ALT: 11 U/L (ref 0–44)
AST: 20 U/L (ref 15–41)
Albumin: 3.8 g/dL (ref 3.5–5.0)
Alkaline Phosphatase: 65 U/L (ref 38–126)
Anion gap: 8 (ref 5–15)
BUN: 31 mg/dL — ABNORMAL HIGH (ref 8–23)
CHLORIDE: 109 mmol/L (ref 98–111)
CO2: 25 mmol/L (ref 22–32)
Calcium: 10.3 mg/dL (ref 8.9–10.3)
Creatinine, Ser: 1.19 mg/dL (ref 0.61–1.24)
GFR calc Af Amer: >60 mL/min (ref >60)
GFR, EST NON AFRICAN AMERICAN: 59 mL/min — AB (ref >60)
Glucose, Bld: 136 mg/dL — ABNORMAL HIGH (ref 70–99)
Potassium: 4.4 mmol/L (ref 3.5–5.1)
Sodium: 142 mmol/L (ref 135–145)
Total Bilirubin: 0.4 mg/dL (ref 0.3–1.2)
Total Protein: 7.3 g/dL (ref 6.5–8.1)

## 2018-08-28 LAB — PSA: PROSTATIC SPECIFIC ANTIGEN: 3.15 ng/mL (ref 0.00–4.00)

## 2018-08-28 MED ORDER — CEFAZOLIN SODIUM-DEXTROSE 2-4 GM/100ML-% IV SOLN
2.0000 g | INTRAVENOUS | Status: AC
  Administered 2018-08-29: 2 g via INTRAVENOUS

## 2018-08-28 MED ORDER — FENTANYL 50 MCG/HR TD PT72: 50.0000 ug | MEDICATED_PATCH | TRANSDERMAL | 0 refills | Status: DC

## 2018-08-28 MED ORDER — DENOSUMAB 120 MG/1.7ML ~~LOC~~ SOLN
120.0000 mg | Freq: Once | SUBCUTANEOUS | Status: AC
  Administered 2018-08-28: 120 mg via SUBCUTANEOUS
  Filled 2018-08-28: qty 1.7

## 2018-08-28 NOTE — Assessment & Plan Note (Addendum)
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 On Xtandi.  #Stable continue X-tandi.    # Right hip pain- status post radiation; worsening; however bone scan September 2019 -no obvious evidence of progression.  Suspect arthritic pain.  Continue fentanyl/  Hydrocodone  new scripts given today.    # Bone mets-on Xgeva monthly.  Calcium normal.  # CKD- stage III; creatinine 1.28 stable.   # DISPOSITION:  #  X-geva today; LABS- cmp/psa # Follow up in 4 weeks/labs-cbc/cmp/psa/X-geva;-Dr.B

## 2018-08-28 NOTE — Telephone Encounter (Signed)
Pt has a question about his medication. He didn't tell me the name of it.

## 2018-08-28 NOTE — Progress Notes (Signed)
[place] OFFICE PROGRESS NOTE  Patient Care Team: Tamsen Roers, MD as PCP - General (Family Medicine) Abbie Sons, MD as Consulting Physician (Urology)  Cancer Staging No matching staging information was found for the patient.   Oncology History   # AUG 2016- METASTATIC PROSTATE CA/ STAGE IV; Castrate sensitive [PSA- 48] ; mets- lumbar spine [s/p pal RT to Aug 2016; Dr.Crystal] /Pelvic LN; 1994- Prostate CA s/p Surgery [facility]]; Lupron q4 M; MARCH 2017- Bone scan- L4/Right pubic rami uptake; PSA- 0.1/testosterone-castrate [<3]  # Bone lesions on denosumab;DEC 2016- Recm q 49M  # NOV 2018- CASTRATE RESISTANT; declined chemo; 08/06/2017- start Zytiga+prednisone; Stopped April 24th [rising PSA];  # may 2019- Xofigo #6 on 10/16]  # OCT 16th 2019- X-tandi  # Left hydronephrosis s/p stenting [Dr.Budzyn] --------------------------------------------   DIAGNOSIS: [ ]  castrate resistant prostate cancer  STAGE:  IV       ;GOALS: palliative  CURRENT/MOST RECENT THERAPY ; Xtandi     Prostate cancer metastatic to bone [facility])      INTERVAL HISTORY:  [name] 78 y.o.  male pleasant patient above history of castrate resistant prostate cancer is here for follow-up.    Patient denies any unusual fatigue.  Denies any dizziness or difficulty sleeping.  Complains of worsening pain in his right hip.  Continues to be on fentanyl patch and hydrocodone.  Review of Systems  Constitutional: Positive for malaise/fatigue. Negative for chills, diaphoresis, fever and weight loss.  HENT: Negative for nosebleeds and sore throat.   Eyes: Negative for double vision.  Respiratory: Negative for cough, hemoptysis, sputum production, shortness of breath and wheezing.   Cardiovascular: Negative for chest pain, palpitations and orthopnea.  Gastrointestinal: Negative for abdominal pain, blood in stool, constipation, diarrhea, heartburn, melena, nausea and vomiting.  Genitourinary:  Negative for dysuria, frequency and urgency.  Musculoskeletal: Positive for back pain and joint pain.  Skin: Negative.  Negative for itching and rash.  Neurological: Negative for dizziness, tingling, focal weakness, weakness and headaches.  Endo/Heme/Allergies: Does not bruise/bleed easily.  Psychiatric/Behavioral: Negative for depression. The patient is not nervous/anxious and does not have insomnia.       PAST MEDICAL HISTORY :  Past Medical History:  Diagnosis Date  . Aortic stenosis, moderate 01/30/2014  . Arthritis   . Atrial flutter ([place]) 02/02/2014  . Diabetes mellitus without complication (HCC)    diet control  . GERD (gastroesophageal reflux disease)   . Heart murmur   . Heart murmur   . History of kidney stones   . HTN (hypertension) 01/30/2014  . Hypercholesteremia   . Hypertension   . Prostate cancer ([place])    metastatic  . Weakness of left leg     PAST SURGICAL HISTORY :   Past Surgical History:  Procedure Laterality Date  . CATARACT EXTRACTION W/ INTRAOCULAR LENS IMPLANT Bilateral   . COLON RESECTION    . CYSTOSCOPY W/ RETROGRADES Left 06/08/2015   Procedure: CYSTOSCOPY WITH RETROGRADE PYELOGRAM;  Surgeon: Nickie Retort, MD;  Location: [facility];  Service: Urology;  Laterality: Left;  . CYSTOSCOPY W/ RETROGRADES Left 05/13/2018   Procedure: CYSTOSCOPY WITH RETROGRADE PYELOGRAM;  Surgeon: Abbie Sons, MD;  Location: [facility];  Service: Urology;  Laterality: Left;  . CYSTOSCOPY W/ URETERAL STENT PLACEMENT Left 10/05/2015   Procedure: CYSTOSCOPY WITH STENT REPLACEMENT;  Surgeon: Nickie Retort, MD;  Location: [facility];  Service: Urology;  Laterality: Left;  . CYSTOSCOPY W/ URETERAL STENT PLACEMENT Left 01/04/2016  Procedure: CYSTOSCOPY WITH STENT REPLACEMENT;  Surgeon: Nickie Retort, MD;  Location: [facility];  Service: Urology;  Laterality: Left;  . CYSTOSCOPY W/ URETERAL STENT PLACEMENT Left 04/11/2016   Procedure: CYSTOSCOPY WITH STENT REPLACEMENT;   Surgeon: Nickie Retort, MD;  Location: [facility];  Service: Urology;  Laterality: Left;  . CYSTOSCOPY W/ URETERAL STENT PLACEMENT Left 07/18/2016   Procedure: CYSTOSCOPY WITH STENT REPLACEMENT;  Surgeon: Nickie Retort, MD;  Location: [facility];  Service: Urology;  Laterality: Left;  . CYSTOSCOPY W/ URETERAL STENT PLACEMENT Left 11/02/2016   Procedure: CYSTOSCOPY WITH STENT REPLACEMENT;  Surgeon: Nickie Retort, MD;  Location: [facility];  Service: Urology;  Laterality: Left;  . CYSTOSCOPY W/ URETERAL STENT PLACEMENT Left 02/08/2017   Procedure: CYSTOSCOPY WITH STENT REPLACEMENT;  Surgeon: Nickie Retort, MD;  Location: [facility];  Service: Urology;  Laterality: Left;  . CYSTOSCOPY W/ URETERAL STENT PLACEMENT Left 05/10/2017   Procedure: CYSTOSCOPY WITH STENT REPLACEMENT;  Surgeon: Nickie Retort, MD;  Location: [facility];  Service: Urology;  Laterality: Left;  . CYSTOSCOPY W/ URETERAL STENT PLACEMENT Left 05/13/2018   Procedure: CYSTOSCOPY WITH STENT Exchange;  Surgeon: Abbie Sons, MD;  Location: [facility];  Service: Urology;  Laterality: Left;  . CYSTOSCOPY WITH STENT PLACEMENT Left 08/16/2017   Procedure: CYSTOSCOPY WITH STENT EXCHANGE;  Surgeon: Abbie Sons, MD;  Location: [facility];  Service: Urology;  Laterality: Left;  . CYSTOSCOPY WITH STENT PLACEMENT Left 12/17/2017   Procedure: CYSTOSCOPY WITH STENT EXCHANGE;  Surgeon: Abbie Sons, MD;  Location: [facility];  Service: Urology;  Laterality: Left;  . EYE SURGERY Bilateral Sept and Oct.2012   cataract  . HERNIA REPAIR    . PROSTATECTOMY  1994    FAMILY HISTORY :   Family History  Problem Relation Age of Onset  . Aneurysm Father   . Cancer Mother        breast    SOCIAL HISTORY:   Social History   Tobacco Use  . Smoking status: Former Smoker    Packs/day: 1.50    Years: 25.00    Pack years: 37.50    Types: Cigarettes    Last attempt to quit: 01/21/1974    Years since quitting: 44.6  . Smokeless  tobacco: Never Used  Substance Use Topics  . Alcohol use: No  . Drug use: No    ALLERGIES:  has No Known Allergies.  MEDICATIONS:  Current Outpatient Medications  Medication Sig Dispense Refill  . allopurinol (ZYLOPRIM) 300 MG tablet Take 300 mg by mouth daily.     Marland Kitchen aspirin 81 MG tablet Take 81 mg by mouth daily.     Marland Kitchen atorvastatin (LIPITOR) 10 MG tablet Take 10 mg by mouth daily with supper.     . carvedilol (COREG) 6.25 MG tablet Take 1 tablet (6.25 mg total) by mouth 2 (two) times daily with a meal. 60 tablet 2  . chlorthalidone (HYGROTON) 25 MG tablet Take 25 mg by mouth daily.     . enzalutamide (XTANDI) 40 MG capsule Take 4 capsules (160 mg total) by mouth daily. 120 capsule 4  . ferrous sulfate 325 (65 FE) MG tablet Take 325 mg by mouth daily with breakfast.     . hydrochlorothiazide (MICROZIDE) 12.5 MG capsule Take 12.5 mg by mouth daily.    Marland Kitchen HYDROcodone-acetaminophen (NORCO/VICODIN) 5-325 MG tablet Take 1 tablet by mouth every 8 (eight) hours as needed for severe pain. 90 tablet 0  . oxybutynin (DITROPAN XL)  15 MG 24 hr tablet Take 1 tablet (15 mg total) by mouth at bedtime. 90 tablet 3  . pantoprazole (PROTONIX) 40 MG tablet Take 1 tablet (40 mg total) by mouth daily before breakfast. 30 tablet 2  . ranitidine (ZANTAC) 150 MG tablet Take 150 mg by mouth 2 (two) times daily.     . vitamin B-12 (CYANOCOBALAMIN) 1000 MCG tablet Take 1,000 mcg by mouth daily.    . vitamin C (ASCORBIC ACID) 500 MG tablet Take 500 mg by mouth daily.    Marland Kitchen VITAMIN D, ERGOCALCIFEROL, PO Take 5,000 Units by mouth once a week.    . diazepam (VALIUM) 5 MG tablet One pill 45-60 mins prior to CT SCAN; 1 pill 15 mins prior if needed. (Patient not taking: Reported on 07/30/2018) 4 tablet 0  . fentaNYL (DURAGESIC - DOSED MCG/HR) 50 MCG/HR Place 1 patch (50 mcg total) onto the skin every 3 (three) days. 10 patch 0   No current facility-administered medications for this visit.     PHYSICAL EXAMINATION: ECOG  PERFORMANCE STATUS: 1 - Symptomatic but completely ambulatory  BP (!) 166/88 (BP Location: Right Arm, Patient Position: Sitting)   Pulse 70   Temp 97.6 F (36.4 C) (Tympanic)   Resp 20   Ht 5\' 11"  (1.803 m)   Wt 178 lb (80.7 kg)   BMI 24.83 kg/m   Filed Weights   08/28/18 1106  Weight: 178 lb (80.7 kg)    Physical Exam  Constitutional: He is oriented to person, place, and time and well-developed, well-nourished, and in no distress.  In wheel chair.  Accompanied by his wife.  HENT:  Head: Normocephalic and atraumatic.  Mouth/Throat: Oropharynx is clear and moist. No oropharyngeal exudate.  Eyes: Pupils are equal, round, and reactive to light.  Neck: Normal range of motion. Neck supple.  Cardiovascular: Normal rate and regular rhythm.  Pulmonary/Chest: No respiratory distress. He has no wheezes.  Abdominal: Soft. Bowel sounds are normal. He exhibits no distension and no mass. There is no abdominal tenderness. There is no rebound and no guarding.  Musculoskeletal:        General: No tenderness or edema.  Neurological: He is alert and oriented to person, place, and time.  Skin: Skin is warm.  Psychiatric: Affect normal.       LABORATORY DATA:  I have reviewed the data as listed    Component Value Date/Time   NA 142 08/28/2018 1138   K 4.4 08/28/2018 1138   CL 109 08/28/2018 1138   CO2 25 08/28/2018 1138   GLUCOSE 136 (H) 08/28/2018 1138   BUN 31 (H) 08/28/2018 1138   CREATININE 1.19 08/28/2018 1138   CALCIUM 10.3 08/28/2018 1138   PROT 7.3 08/28/2018 1138   ALBUMIN 3.8 08/28/2018 1138   AST 20 08/28/2018 1138   ALT 11 08/28/2018 1138   ALKPHOS 65 08/28/2018 1138   BILITOT 0.4 08/28/2018 1138   GFRNONAA 59 (L) 08/28/2018 1138   GFRAA >60 08/28/2018 1138    No results found for: SPEP, UPEP  Lab Results  Component Value Date   WBC 6.5 08/28/2018   NEUTROABS 4.3 08/28/2018   HGB 11.3 (L) 08/28/2018   HCT 36.3 (L) 08/28/2018   MCV 93.8 08/28/2018   PLT 170  08/28/2018      Chemistry      Component Value Date/Time   NA 142 08/28/2018 1138   K 4.4 08/28/2018 1138   CL 109 08/28/2018 1138   CO2 25 08/28/2018 1138  BUN 31 (H) 08/28/2018 1138   CREATININE 1.19 08/28/2018 1138      Component Value Date/Time   CALCIUM 10.3 08/28/2018 1138   ALKPHOS 65 08/28/2018 1138   AST 20 08/28/2018 1138   ALT 11 08/28/2018 1138   BILITOT 0.4 08/28/2018 1138       RADIOGRAPHIC STUDIES: I have personally reviewed the radiological images as listed and agreed with the findings in the report. No results found.   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 On Xtandi.  #Stable continue X-tandi.    # Right hip pain- status post radiation; worsening; however bone scan September 2019 -no obvious evidence of progression.  Suspect arthritic pain.  Continue fentanyl/  Hydrocodone  new scripts given today.    # Bone mets-on Xgeva monthly.  Calcium normal.  # CKD- stage III; creatinine 1.28 stable.   # DISPOSITION:  #  X-geva today; LABS- cmp/psa # Follow up in 4 weeks/labs-cbc/cmp/psa/X-geva;-Dr.B   Orders Placed This Encounter  Procedures  . Comprehensive metabolic panel    Standing Status:   Future    Number of Occurrences:   1    Standing Expiration Date:   08/29/2019  . PSA    Standing Status:   Future    Number of Occurrences:   1    Standing Expiration Date:   08/29/2019   All questions were answered. The patient knows to call the clinic with any problems, questions or concerns.      Cammie Sickle, MD 08/28/2018 2:46 PM

## 2018-08-29 ENCOUNTER — Other Ambulatory Visit: Payer: Self-pay

## 2018-08-29 ENCOUNTER — Ambulatory Visit
Admission: RE | Admit: 2018-08-29 | Discharge: 2018-08-29 | Disposition: A | Discharge status: Home / Self Care | Payer: Medicare HMO | Attending: Podiatry | Admitting: Podiatry

## 2018-08-29 ENCOUNTER — Ambulatory Visit: Payer: Medicare HMO | Admitting: Anesthesiology

## 2018-08-29 ENCOUNTER — Encounter
Admission: RE | Disposition: A | Discharge status: Home / Self Care | Payer: Self-pay | Source: Home / Self Care | Attending: Podiatry

## 2018-08-29 ENCOUNTER — Encounter: Payer: Self-pay | Admitting: *Deleted

## 2018-08-29 DIAGNOSIS — Z7982 Long term (current) use of aspirin: Secondary | ICD-10-CM | POA: Insufficient documentation

## 2018-08-29 DIAGNOSIS — M86671 Other chronic osteomyelitis, right ankle and foot: Secondary | ICD-10-CM | POA: Insufficient documentation

## 2018-08-29 DIAGNOSIS — N289 Disorder of kidney and ureter, unspecified: Secondary | ICD-10-CM | POA: Insufficient documentation

## 2018-08-29 DIAGNOSIS — Z8546 Personal history of malignant neoplasm of prostate: Secondary | ICD-10-CM | POA: Insufficient documentation

## 2018-08-29 DIAGNOSIS — I10 Essential (primary) hypertension: Secondary | ICD-10-CM | POA: Insufficient documentation

## 2018-08-29 DIAGNOSIS — E785 Hyperlipidemia, unspecified: Secondary | ICD-10-CM | POA: Insufficient documentation

## 2018-08-29 DIAGNOSIS — M109 Gout, unspecified: Secondary | ICD-10-CM | POA: Insufficient documentation

## 2018-08-29 DIAGNOSIS — Z79891 Long term (current) use of opiate analgesic: Secondary | ICD-10-CM | POA: Insufficient documentation

## 2018-08-29 DIAGNOSIS — Z87891 Personal history of nicotine dependence: Secondary | ICD-10-CM | POA: Diagnosis not present

## 2018-08-29 DIAGNOSIS — K219 Gastro-esophageal reflux disease without esophagitis: Secondary | ICD-10-CM | POA: Diagnosis not present

## 2018-08-29 DIAGNOSIS — L97511 Non-pressure chronic ulcer of other part of right foot limited to breakdown of skin: Secondary | ICD-10-CM | POA: Diagnosis not present

## 2018-08-29 DIAGNOSIS — E1169 Type 2 diabetes mellitus with other specified complication: Secondary | ICD-10-CM | POA: Insufficient documentation

## 2018-08-29 DIAGNOSIS — Z79899 Other long term (current) drug therapy: Secondary | ICD-10-CM | POA: Diagnosis not present

## 2018-08-29 DIAGNOSIS — M869 Osteomyelitis, unspecified: Secondary | ICD-10-CM | POA: Diagnosis present

## 2018-08-29 HISTORY — PX: AMPUTATION TOE: SHX6595

## 2018-08-29 LAB — GLUCOSE, CAPILLARY
GLUCOSE-CAPILLARY: 129 mg/dL — AB (ref 70–99)
Glucose-Capillary: 131 mg/dL — ABNORMAL HIGH (ref 70–99)

## 2018-08-29 SURGERY — AMPUTATION, TOE
Anesthesia: Monitor Anesthesia Care | Laterality: Right

## 2018-08-29 MED ORDER — FENTANYL CITRATE (PF) 100 MCG/2ML IJ SOLN
INTRAMUSCULAR | Status: DC | PRN
  Administered 2018-08-29 (×2): 25 ug via INTRAVENOUS

## 2018-08-29 MED ORDER — BUPIVACAINE HCL (PF) 0.5 % IJ SOLN
INTRAMUSCULAR | Status: AC
  Filled 2018-08-29: qty 30

## 2018-08-29 MED ORDER — FENTANYL CITRATE (PF) 100 MCG/2ML IJ SOLN: 25.0000 ug | INTRAMUSCULAR | Status: DC | PRN

## 2018-08-29 MED ORDER — PROPOFOL 10 MG/ML IV BOLUS
INTRAVENOUS | Status: AC
  Filled 2018-08-29: qty 20

## 2018-08-29 MED ORDER — ONDANSETRON HCL 4 MG/2ML IJ SOLN: 4.0000 mg | Freq: Once | INTRAMUSCULAR | Status: DC | PRN

## 2018-08-29 MED ORDER — BUPIVACAINE HCL 0.5 % IJ SOLN
INTRAMUSCULAR | Status: DC | PRN
  Administered 2018-08-29: 5 mL

## 2018-08-29 MED ORDER — POVIDONE-IODINE 7.5 % EX SOLN
Freq: Once | CUTANEOUS | Status: DC
  Filled 2018-08-29: qty 118

## 2018-08-29 MED ORDER — LIDOCAINE HCL (CARDIAC) PF 100 MG/5ML IV SOSY
PREFILLED_SYRINGE | INTRAVENOUS | Status: DC | PRN
  Administered 2018-08-29: 50 mg via INTRATRACHEAL

## 2018-08-29 MED ORDER — LIDOCAINE HCL (PF) 2 % IJ SOLN
INTRAMUSCULAR | Status: AC
  Filled 2018-08-29: qty 10

## 2018-08-29 MED ORDER — SODIUM CHLORIDE 0.9 % IV SOLN
INTRAVENOUS | Status: DC
  Administered 2018-08-29: 09:00:00 via INTRAVENOUS

## 2018-08-29 MED ORDER — FENTANYL CITRATE (PF) 100 MCG/2ML IJ SOLN
INTRAMUSCULAR | Status: AC
  Filled 2018-08-29: qty 2

## 2018-08-29 MED ORDER — PROPOFOL 10 MG/ML IV BOLUS
INTRAVENOUS | Status: DC | PRN
  Administered 2018-08-29: 10 mg via INTRAVENOUS
  Administered 2018-08-29: 30 mg via INTRAVENOUS
  Administered 2018-08-29 (×2): 5 mg via INTRAVENOUS

## 2018-08-29 MED ORDER — CEFAZOLIN SODIUM-DEXTROSE 2-4 GM/100ML-% IV SOLN
INTRAVENOUS | Status: AC
  Filled 2018-08-29: qty 100

## 2018-08-29 SURGICAL SUPPLY — 39 items
"PENCIL ELECTRO HAND CTR " (MISCELLANEOUS) ×1 IMPLANT
BANDAGE ELASTIC 4 LF NS (GAUZE/BANDAGES/DRESSINGS) ×3 IMPLANT
BLADE MED AGGRESSIVE (BLADE) ×3 IMPLANT
BLADE OSC/SAGITTAL MD 5.5X18 (BLADE) ×2 IMPLANT
BLADE SURG 15 STRL LF DISP TIS (BLADE) ×4 IMPLANT
BLADE SURG 15 STRL SS (BLADE) ×12
BNDG CONFORM 3 STRL LF (GAUZE/BANDAGES/DRESSINGS) ×3 IMPLANT
BNDG ESMARK 4X12 TAN STRL LF (GAUZE/BANDAGES/DRESSINGS) ×3 IMPLANT
BNDG GAUZE 4.5X4.1 6PLY STRL (MISCELLANEOUS) ×3 IMPLANT
CANISTER SUCT 1200ML W/VALVE (MISCELLANEOUS) ×3 IMPLANT
COVER WAND RF STERILE (DRAPES) ×3 IMPLANT
CUFF TOURN 18 STER (MISCELLANEOUS) ×3 IMPLANT
CUFF TOURN DUAL PL 12 NO SLV (MISCELLANEOUS) ×3 IMPLANT
DRAPE FLUOR MINI C-ARM 54X84 (DRAPES) ×3 IMPLANT
DURAPREP 26ML APPLICATOR (WOUND CARE) ×3 IMPLANT
ELECT REM PT RETURN 9FT ADLT (ELECTROSURGICAL) ×3
ELECTRODE REM PT RTRN 9FT ADLT (ELECTROSURGICAL) ×1 IMPLANT
GAUZE PETRO XEROFOAM 1X8 (MISCELLANEOUS) ×3 IMPLANT
GAUZE SPONGE 4X4 12PLY STRL (GAUZE/BANDAGES/DRESSINGS) ×3 IMPLANT
GLOVE BIO SURGEON STRL SZ8 (GLOVE) ×3 IMPLANT
GLOVE INDICATOR 8.0 STRL GRN (GLOVE) ×3 IMPLANT
GOWN STRL REUS W/ TWL LRG LVL3 (GOWN DISPOSABLE) ×2 IMPLANT
GOWN STRL REUS W/TWL LRG LVL3 (GOWN DISPOSABLE) ×6
KIT TURNOVER KIT A (KITS) ×3 IMPLANT
LABEL OR SOLS (LABEL) ×3 IMPLANT
NDL HYPO 25X1 1.5 SAFETY (NEEDLE) ×3 IMPLANT
NDL SAFETY ECLIPSE 18X1.5 (NEEDLE) ×1 IMPLANT
NEEDLE HYPO 18GX1.5 SHARP (NEEDLE) ×2
NEEDLE HYPO 25X1 1.5 SAFETY (NEEDLE) ×9 IMPLANT
NS IRRIG 500ML POUR BTL (IV SOLUTION) ×3 IMPLANT
PACK EXTREMITY ARMC (MISCELLANEOUS) ×3 IMPLANT
PENCIL ELECTRO HAND CTR (MISCELLANEOUS) ×3 IMPLANT
RASP SM TEAR CROSS CUT (RASP) ×3 IMPLANT
STOCKINETTE STRL 6IN 960660 (GAUZE/BANDAGES/DRESSINGS) ×3 IMPLANT
SUT ETH BLK MONO 3 0 FS 1 12/B (SUTURE) ×3 IMPLANT
SUT ETHILON 5 0 PS 2 18 (SUTURE) ×3 IMPLANT
SUT VIC AB 4-0 FS2 27 (SUTURE) ×3 IMPLANT
SWAB CULTURE AMIES ANAERIB BLU (MISCELLANEOUS) ×3 IMPLANT
SYR 10ML LL (SYRINGE) ×3 IMPLANT

## 2018-08-29 NOTE — Anesthesia Post-op Follow-up Note (Signed)
Anesthesia QCDR form completed.        

## 2018-08-29 NOTE — Anesthesia Postprocedure Evaluation (Signed)
Anesthesia Post Note  Patient: [name]  Procedure(s) Performed: Toe IPJ Right (Right )  Patient location during evaluation: PACU Anesthesia Type: General Level of consciousness: awake and alert and oriented Pain management: pain level controlled Vital Signs Assessment: post-procedure vital signs reviewed and stable Respiratory status: spontaneous breathing, nonlabored ventilation and respiratory function stable Cardiovascular status: blood pressure returned to baseline and stable Postop Assessment: no signs of nausea or vomiting Anesthetic complications: no     Last Vitals:  Vitals:   08/29/18 1046 08/29/18 1108  BP: (!) 160/71 (!) 159/80  Pulse:  70  Resp: 18   Temp:  36.7 C  SpO2: 100% 100%    Last Pain:  Vitals:   08/29/18 1108  TempSrc: Oral  PainSc:                  Senan Urey

## 2018-08-29 NOTE — Transfer of Care (Signed)
Immediate Anesthesia Transfer of Care Note  Patient: [name]  Procedure(s) Performed: Toe IPJ Right (Right )  Patient Location: PACU  Anesthesia Type:MAC  Level of Consciousness: awake, alert  and oriented  Airway & Oxygen Therapy: Patient Spontanous Breathing  Post-op Assessment: Report given to RN and Post -op Vital signs reviewed and stable  Post vital signs: Reviewed and stable  Last Vitals:  Vitals Value Taken Time  BP 150/81 08/29/2018  9:56 AM  Temp    Pulse 69 08/29/2018 10:00 AM  Resp 11 08/29/2018 10:00 AM  SpO2 99 % 08/29/2018 10:00 AM  Vitals shown include unvalidated device data.  Last Pain:  Vitals:   08/29/18 0838  TempSrc: Oral  PainSc: 10-Worst pain ever         Complications: No apparent anesthesia complications

## 2018-08-29 NOTE — H&P (Signed)
H and P has been reviewed and no changes are noted.  

## 2018-08-29 NOTE — Telephone Encounter (Signed)
Patient had spoken with Amy about is medication question.

## 2018-08-29 NOTE — OR Nursing (Signed)
Provided patient with incentive spirometer, instructed with return demonstration.

## 2018-08-29 NOTE — Discharge Instructions (Addendum)
[place] DR. Naselle   1. Take your medication as prescribed.  Pain medication should be taken only as needed.  2. Keep the dressing clean, dry and intact.  3. Keep your foot elevated above the heart level for the first 48 hours.  4. Walking to the bathroom and brief periods of walking are acceptable, unless we have instructed you to be non-weight bearing.  5. Always wear your post-op shoe when walking.  Always use your crutches if you are to be non-weight bearing.  6. Do notget foot wet. Baths or showers are permissible as long as the foot and leg are in a waterproof shower cast protector.  7. Every hour you are awake:  - Bend your knee 15 times. - Flex foot 15 times - Massage calf 15 times  8. Call [facility] [phone]) if any of the following problems occur: - You develop a temperature or fever. - The bandage becomes saturated with blood. - Medication does not stop your pain. - Injury of the foot occurs. - Any symptoms of infection including redness, odor, or red streaks running from wound. -   AMBULATORY SURGERY  DISCHARGE INSTRUCTIONS   1) The drugs that you were given will stay in your system until tomorrow so for the next 24 hours you should not:  A) Drive an automobile B) Make any legal decisions C) Drink any alcoholic beverage   2) You may resume regular meals tomorrow.  Today it is better to start with liquids and gradually work up to solid foods.  You may eat anything you prefer, but it is better to start with liquids, then soup and crackers, and gradually work up to solid foods.   3) Please notify your doctor immediately if you have any unusual bleeding, trouble breathing, redness and pain at the surgery site, drainage, fever, or pain not relieved by medication.    4) Additional Instructions:        Please  contact your physician with any problems or Same Day Surgery at [phone], Monday through Friday 6 am to 4 pm, or [place] at [facility] number at [phone].

## 2018-08-29 NOTE — Anesthesia Preprocedure Evaluation (Signed)
Anesthesia Evaluation  Patient identified by MRN, date of birth, ID band Patient awake    Reviewed: Allergy & Precautions, NPO status , Patient's Chart, lab work & pertinent test results  History of Anesthesia Complications Negative for: history of anesthetic complications  Airway Mallampati: II  TM Distance: >3 FB Neck ROM: Full    Dental  (+) Upper Dentures, Lower Dentures   Pulmonary neg sleep apnea, neg COPD, former smoker,    breath sounds clear to auscultation- rhonchi (-) wheezing      Cardiovascular hypertension, Pt. on medications + Past MI  (-) CAD, (-) Cardiac Stents and (-) CABG + Valvular Problems/Murmurs AS  Rhythm:Regular Rate:Normal - Systolic murmurs and - Diastolic murmurs Echo 4/40/10: - Left ventricle: The cavity size was mildly dilated. Wall   thickness was normal. Systolic function was normal. The estimated   ejection fraction was in the range of 55% to 60%. Left   ventricular diastolic function parameters were normal for the   patient&'s age. - Aortic valve: Moderately calcified annulus. Moderately calcified   leaflets. There was moderate stenosis. There was moderate   regurgitation. Valve area (VTI): 1.16 cm^2. Valve area (Vmax):   1.16 cm^2. Valve area (Vmean): 1.17 cm^2. - Mitral valve: There was mild regurgitation. - Tricuspid valve: There was moderate regurgitation.   Neuro/Psych neg Seizures negative neurological ROS  negative psych ROS   GI/Hepatic Neg liver ROS, GERD  ,  Endo/Other  diabetes  Renal/GU Renal InsufficiencyRenal disease     Musculoskeletal  (+) Arthritis ,   Abdominal (+) - obese,   Peds  Hematology  (+) anemia ,   Anesthesia Other Findings Past Medical History: 01/30/2014: Aortic stenosis, moderate No date: Arthritis 02/02/2014: Atrial flutter (HCC) No date: Diabetes mellitus without complication (HCC)     Comment:  diet control No date: GERD (gastroesophageal  reflux disease) No date: Heart murmur No date: Heart murmur No date: History of kidney stones 01/30/2014: HTN (hypertension) No date: Hypercholesteremia No date: Hypertension No date: Prostate cancer ([place])     Comment:  metastatic No date: Weakness of left leg   Reproductive/Obstetrics                             Anesthesia Physical Anesthesia Plan  ASA: III  Anesthesia Plan: General   Post-op Pain Management:    Induction: Intravenous  PONV Risk Score and Plan: 1 and Propofol infusion  Airway Management Planned: Natural Airway  Additional Equipment:   Intra-op Plan:   Post-operative Plan:   Informed Consent: I have reviewed the patients History and Physical, chart, labs and discussed the procedure including the risks, benefits and alternatives for the proposed anesthesia with the patient or authorized representative who has indicated his/her understanding and acceptance.   Dental advisory given  Plan Discussed with: CRNA and Anesthesiologist  Anesthesia Plan Comments:         Anesthesia Quick Evaluation

## 2018-08-29 NOTE — Op Note (Signed)
Operative note   Surgeon: Dr. Albertine Patricia, DPM.    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  Amputation distal third toe including distal one half of middle phalanx right foot           EBL: Less than 5 cc    Anesthesia:IV sedation delivered by the anesthesia team I delivered 4 cc of 0.5% bupivacaine plain preoperatively at the base of the toe    Hemostasis: Ankle tourniquet 225 mils of mercury pressure for 11 minutes    Specimen: Distal portion of toe with demineralized distal phalanx third toe right foot    Complications: None    Operative indications: Patient had ulceration of the tip of the third toe that was in part due to the contracture of the digit and resulted in unrelenting pressure across the tip of the toe.  He eventually developed osteomyelitis now removed part of that bone that was exposed in the wound any progressed and stayed stable for a number of months but eventually the toe became little more swollen and the wound would not heal.  X-ray showed further demineralization of the residual middle phalanx and I felt like we need to go ahead and remove the distal portion of the toe the distal phalanx residual and also the stable distal portion of the middle phalanx which showed no evidence of involvement.    Procedure:  Patient was brought into the OR and placed on the operating table in thesupine position. After anesthesia was obtained theright lower extremity was prepped and draped in usual sterile fashion.  Operative Report: This time attention was directed to the distal portion of the third toe of the right foot.  A fishmouth type incision was made proximal to the nail complex and carried distally to the distal portion of the toe.  This was carried down to bone this portion of skin and nail complex and distal phalanx was then removed in toto and sent to  pathology.  The middle phalanx was identified at this time and power equipment was used to resect the distal half of the middle phalanx.  No evidence of demineralization of bone involvement was seen in the middle phalanx at all.  Once this is accomplished the area was copiously irrigated the ulceration distally was partially excised with the original incision but a distal plantar portion was removed with a V incision across the region.  This also will help remove any dogears.  Once this is accomplished area was copiously irrigated once again.  The incision was then closed with a combination of 3-0 Vicryl simple interrupted and horizontal mattress sutures.  A sterile dressing was placed across the area consisting of Xeroform gauze 4 x 4's Kling and Kerlix.  Tourniquet was released and brought complete vascular sling return to the digits prior to wrapping.    Patient tolerated the procedure and anesthesia well.  Was transported from the OR to the PACU with all vital signs stable and vascular status intact. To be discharged per routine protocol.  Will follow up in approximately 1 week in the outpatient clinic.

## 2018-08-29 NOTE — Anesthesia Procedure Notes (Signed)
Date/Time: 08/29/2018 9:20 AM Performed by: Babs Sciara, CRNA Oxygen Delivery Method: Simple face mask

## 2018-09-01 LAB — SURGICAL PATHOLOGY

## 2018-09-05 ENCOUNTER — Telehealth: Payer: Self-pay | Admitting: Radiology

## 2018-09-05 NOTE — Telephone Encounter (Signed)
Patient states he previously took oxybutynin 5mg  3-4 times daily. Since this was changed to 15mg  XL at bedtime medication isn't controlling symptoms through the day. Please advise.

## 2018-09-07 NOTE — Telephone Encounter (Signed)
He can go back to oxybutinin 5 mg but shouldn't take more than 3 times daily

## 2018-09-08 ENCOUNTER — Other Ambulatory Visit: Payer: Self-pay

## 2018-09-08 MED ORDER — OXYBUTYNIN CHLORIDE ER 5 MG PO TB24: 5.0000 mg | ORAL_TABLET | Freq: Three times a day (TID) | ORAL | 11 refills | Status: AC

## 2018-09-08 NOTE — Telephone Encounter (Signed)
Called patient to advise Dr Dene Gentry response to Oxybutynin. Left message to return call. Sent new Rx to pharmacy for 5mg  tid.

## 2018-09-08 NOTE — Telephone Encounter (Signed)
Pt called office and I read message from [facility].

## 2018-09-18 ENCOUNTER — Other Ambulatory Visit: Payer: Self-pay

## 2018-09-18 DIAGNOSIS — C61 Malignant neoplasm of prostate: Secondary | ICD-10-CM

## 2018-09-18 DIAGNOSIS — C7951 Secondary malignant neoplasm of bone: Principal | ICD-10-CM

## 2018-09-25 ENCOUNTER — Inpatient Hospital Stay: Payer: Medicare HMO | Attending: Internal Medicine

## 2018-09-25 ENCOUNTER — Inpatient Hospital Stay: Payer: Medicare HMO

## 2018-09-25 ENCOUNTER — Encounter: Payer: Self-pay | Admitting: Internal Medicine

## 2018-09-25 ENCOUNTER — Inpatient Hospital Stay (HOSPITAL_BASED_OUTPATIENT_CLINIC_OR_DEPARTMENT_OTHER): Payer: Medicare HMO | Admitting: Hospice and Palliative Medicine

## 2018-09-25 ENCOUNTER — Inpatient Hospital Stay (HOSPITAL_BASED_OUTPATIENT_CLINIC_OR_DEPARTMENT_OTHER): Payer: Medicare HMO | Admitting: Internal Medicine

## 2018-09-25 ENCOUNTER — Telehealth: Payer: Self-pay | Admitting: Internal Medicine

## 2018-09-25 VITALS — BP 123/73 | HR 76 | Temp 97.8°F | Resp 20

## 2018-09-25 DIAGNOSIS — M25551 Pain in right hip: Secondary | ICD-10-CM

## 2018-09-25 DIAGNOSIS — Z79899 Other long term (current) drug therapy: Secondary | ICD-10-CM | POA: Diagnosis not present

## 2018-09-25 DIAGNOSIS — Z87442 Personal history of urinary calculi: Secondary | ICD-10-CM | POA: Diagnosis not present

## 2018-09-25 DIAGNOSIS — C7951 Secondary malignant neoplasm of bone: Secondary | ICD-10-CM

## 2018-09-25 DIAGNOSIS — N1330 Unspecified hydronephrosis: Secondary | ICD-10-CM

## 2018-09-25 DIAGNOSIS — I129 Hypertensive chronic kidney disease with stage 1 through stage 4 chronic kidney disease, or unspecified chronic kidney disease: Secondary | ICD-10-CM | POA: Diagnosis not present

## 2018-09-25 DIAGNOSIS — Z87891 Personal history of nicotine dependence: Secondary | ICD-10-CM

## 2018-09-25 DIAGNOSIS — I4892 Unspecified atrial flutter: Secondary | ICD-10-CM

## 2018-09-25 DIAGNOSIS — I350 Nonrheumatic aortic (valve) stenosis: Secondary | ICD-10-CM

## 2018-09-25 DIAGNOSIS — M129 Arthropathy, unspecified: Secondary | ICD-10-CM

## 2018-09-25 DIAGNOSIS — E1122 Type 2 diabetes mellitus with diabetic chronic kidney disease: Secondary | ICD-10-CM

## 2018-09-25 DIAGNOSIS — Z923 Personal history of irradiation: Secondary | ICD-10-CM

## 2018-09-25 DIAGNOSIS — Z515 Encounter for palliative care: Secondary | ICD-10-CM | POA: Diagnosis present

## 2018-09-25 DIAGNOSIS — N183 Chronic kidney disease, stage 3 (moderate): Secondary | ICD-10-CM | POA: Diagnosis not present

## 2018-09-25 DIAGNOSIS — C61 Malignant neoplasm of prostate: Secondary | ICD-10-CM

## 2018-09-25 DIAGNOSIS — K219 Gastro-esophageal reflux disease without esophagitis: Secondary | ICD-10-CM | POA: Insufficient documentation

## 2018-09-25 DIAGNOSIS — Z7189 Other specified counseling: Secondary | ICD-10-CM

## 2018-09-25 DIAGNOSIS — E7800 Pure hypercholesterolemia, unspecified: Secondary | ICD-10-CM | POA: Insufficient documentation

## 2018-09-25 DIAGNOSIS — G893 Neoplasm related pain (acute) (chronic): Secondary | ICD-10-CM

## 2018-09-25 DIAGNOSIS — Z7982 Long term (current) use of aspirin: Secondary | ICD-10-CM

## 2018-09-25 LAB — CBC WITH DIFFERENTIAL/PLATELET
Abs Immature Granulocytes: 0.02 10*3/uL (ref 0.00–0.07)
Basophils Absolute: 0.1 10*3/uL (ref 0.0–0.1)
Basophils Relative: 1 %
EOS ABS: 0.3 10*3/uL (ref 0.0–0.5)
Eosinophils Relative: 5 %
HCT: 39.5 % (ref 39.0–52.0)
Hemoglobin: 12.5 g/dL — ABNORMAL LOW (ref 13.0–17.0)
IMMATURE GRANULOCYTES: 0 %
Lymphocytes Relative: 15 %
Lymphs Abs: 0.9 10*3/uL (ref 0.7–4.0)
MCH: 28.9 pg (ref 26.0–34.0)
MCHC: 31.6 g/dL (ref 30.0–36.0)
MCV: 91.4 fL (ref 80.0–100.0)
Monocytes Absolute: 0.6 10*3/uL (ref 0.1–1.0)
Monocytes Relative: 10 %
NEUTROS PCT: 69 %
Neutro Abs: 4.1 10*3/uL (ref 1.7–7.7)
Platelets: 169 10*3/uL (ref 150–400)
RBC: 4.32 MIL/uL (ref 4.22–5.81)
RDW: 15.1 % (ref 11.5–15.5)
WBC: 6.1 10*3/uL (ref 4.0–10.5)
nRBC: 0 % (ref 0.0–0.2)

## 2018-09-25 LAB — COMPREHENSIVE METABOLIC PANEL
ALT: 11 U/L (ref 0–44)
ANION GAP: 8 (ref 5–15)
AST: 20 U/L (ref 15–41)
Albumin: 3.9 g/dL (ref 3.5–5.0)
Alkaline Phosphatase: 76 U/L (ref 38–126)
BUN: 33 mg/dL — ABNORMAL HIGH (ref 8–23)
CO2: 24 mmol/L (ref 22–32)
Calcium: 10.1 mg/dL (ref 8.9–10.3)
Chloride: 106 mmol/L (ref 98–111)
Creatinine, Ser: 1.13 mg/dL (ref 0.61–1.24)
GFR calc non Af Amer: >60 mL/min (ref >60)
Glucose, Bld: 175 mg/dL — ABNORMAL HIGH (ref 70–99)
Potassium: 4 mmol/L (ref 3.5–5.1)
Sodium: 138 mmol/L (ref 135–145)
Total Bilirubin: 0.4 mg/dL (ref 0.3–1.2)
Total Protein: 7.5 g/dL (ref 6.5–8.1)

## 2018-09-25 LAB — PSA: Prostatic Specific Antigen: 4.03 ng/mL — ABNORMAL HIGH (ref 0.00–4.00)

## 2018-09-25 MED ORDER — DEXAMETHASONE 4 MG PO TABS: 4.0000 mg | ORAL_TABLET | Freq: Every day | ORAL | 0 refills | Status: DC

## 2018-09-25 MED ORDER — DENOSUMAB 120 MG/1.7ML ~~LOC~~ SOLN
120.0000 mg | Freq: Once | SUBCUTANEOUS | Status: AC
  Administered 2018-09-25: 120 mg via SUBCUTANEOUS
  Filled 2018-09-25: qty 1.7

## 2018-09-25 MED ORDER — NALOXONE HCL 4 MG/0.1ML NA LIQD: NASAL | 0 refills | Status: DC

## 2018-09-25 MED ORDER — OXYCODONE HCL 5 MG PO TABS: 5.0000 mg | ORAL_TABLET | ORAL | 0 refills | Status: DC | PRN

## 2018-09-25 NOTE — Assessment & Plan Note (Addendum)
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 On Xtandi.  Stable  #Clinically stable except worsening right hip pain see discussion below.  # Right hip pain- status post radiation; worsening; however bone scan September 2019 -no obvious evidence of progression.mRI left hip- no obvious mets; stable L4 disease. Await palliative care discussion today;  left message for [facility].   # Bone mets-on Xgeva monthly.  Calcium normal.  # CKD- stage III; creatinine 1.28 stable.   # DISPOSITION:  #  X-geva today;  # Follow up in 4 weeks/labs-cbc/cmp/psa/X-geva;-Dr.B  Addendum-discussed with Morley Kos.  Orthopedics; will proceed with PET scan.  Will inform patient.

## 2018-09-25 NOTE — Progress Notes (Signed)
[place]  [phone] [phone]   Name: [name] Date: 09/25/2018 MRN: [id number]  DOB: Jun 13, 1941  Patient Care Team: Tamsen Roers, MD as PCP - General (Family Medicine) Bernardo Heater, Ronda Fairly, MD as Consulting Physician (Urology)    REASON FOR CONSULTATION: Palliative Care consult requested for this 78 y.o. 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  PMH also notable for hydronephrosis status post stenting, moderate aortic stenosis, hypertension, diabetes.  Patient has had severe hip pain.  He was referred to palliative care for help with symptom management and to address goals.  SOCIAL HISTORY:    Patient is married and lives at home with his wife.  He has several adult children.  He is a retired Patent examiner for [employer].  ADVANCE DIRECTIVES:  Not on file  CODE STATUS: DNR  PAST MEDICAL HISTORY: Past Medical History:  Diagnosis Date  . Aortic stenosis, moderate 01/30/2014  . Arthritis   . Atrial flutter ([place]) 02/02/2014  . Diabetes mellitus without complication (HCC)    diet control  . GERD (gastroesophageal reflux disease)   . Heart murmur   . Heart murmur   . History of kidney stones   . HTN (hypertension) 01/30/2014  . Hypercholesteremia   . Hypertension   . Prostate cancer ([place])    metastatic  . Weakness of left leg     PAST SURGICAL HISTORY:  Past Surgical History:  Procedure Laterality Date  . 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 CATARACT EXTRACTION W/ INTRAOCULAR LENS IMPLANT Bilateral   . COLON RESECTION    . CYSTOSCOPY W/ RETROGRADES Left 06/08/2015   Procedure: CYSTOSCOPY WITH RETROGRADE PYELOGRAM;  Surgeon: Nickie Retort, MD;  Location: [facility];  Service:  Urology;  Laterality: Left;  . CYSTOSCOPY W/ RETROGRADES Left 05/13/2018   Procedure: CYSTOSCOPY WITH RETROGRADE PYELOGRAM;  Surgeon: Abbie Sons, MD;  Location: [facility];  Service: Urology;  Laterality: Left;  . CYSTOSCOPY W/ URETERAL STENT PLACEMENT Left 10/05/2015   Procedure: CYSTOSCOPY WITH STENT REPLACEMENT;  Surgeon: Nickie Retort, MD;  Location: [facility];  Service: Urology;  Laterality: Left;  . CYSTOSCOPY W/ URETERAL STENT PLACEMENT Left 01/04/2016   Procedure: CYSTOSCOPY WITH STENT REPLACEMENT;  Surgeon: Nickie Retort, MD;  Location: [facility];  Service: Urology;  Laterality: Left;  . CYSTOSCOPY W/ URETERAL STENT PLACEMENT Left 04/11/2016   Procedure: CYSTOSCOPY WITH STENT REPLACEMENT;  Surgeon: Nickie Retort, MD;  Location: [facility];  Service: Urology;  Laterality: Left;  . CYSTOSCOPY W/ URETERAL STENT PLACEMENT Left 07/18/2016   Procedure: CYSTOSCOPY WITH STENT REPLACEMENT;  Surgeon: Nickie Retort, MD;  Location: [facility];  Service: Urology;  Laterality: Left;  . CYSTOSCOPY W/ URETERAL STENT PLACEMENT Left 11/02/2016   Procedure: CYSTOSCOPY WITH STENT REPLACEMENT;  Surgeon: Nickie Retort, MD;  Location: [facility];  Service: Urology;  Laterality: Left;  . CYSTOSCOPY W/ URETERAL STENT PLACEMENT Left 02/08/2017   Procedure: CYSTOSCOPY WITH STENT REPLACEMENT;  Surgeon: Nickie Retort, MD;  Location: [facility];  Service: Urology;  Laterality: Left;  . CYSTOSCOPY W/ URETERAL STENT PLACEMENT Left 05/10/2017   Procedure: CYSTOSCOPY WITH STENT REPLACEMENT;  Surgeon: Nickie Retort, MD;  Location: [facility];  Service: Urology;  Laterality: Left;  .  CYSTOSCOPY W/ URETERAL STENT PLACEMENT Left 05/13/2018   Procedure: CYSTOSCOPY WITH STENT Exchange;  Surgeon: Abbie Sons, MD;  Location: [facility];  Service: Urology;  Laterality: Left;  . CYSTOSCOPY WITH STENT PLACEMENT Left 08/16/2017   Procedure: CYSTOSCOPY WITH STENT EXCHANGE;  Surgeon: Abbie Sons, MD;   Location: [facility];  Service: Urology;  Laterality: Left;  . CYSTOSCOPY WITH STENT PLACEMENT Left 12/17/2017   Procedure: CYSTOSCOPY WITH STENT EXCHANGE;  Surgeon: Abbie Sons, MD;  Location: [facility];  Service: Urology;  Laterality: Left;  . EYE SURGERY Bilateral Sept and Oct.2012   cataract  . HERNIA REPAIR    . PROSTATECTOMY  1994    HEMATOLOGY/ONCOLOGY HISTORY:  Oncology History   # AUG 2016- METASTATIC PROSTATE CA/ STAGE IV; Castrate sensitive [PSA- 48] ; mets- lumbar spine [s/p pal RT to Aug 2016; Dr.Crystal] /Pelvic LN; 1994- Prostate CA s/p Surgery [facility]]; Lupron q4 M; MARCH 2017- Bone scan- L4/Right pubic rami uptake; PSA- 0.1/testosterone-castrate [<3]  # Bone lesions on denosumab;DEC 2016- Recm q 54M  # NOV 2018- CASTRATE RESISTANT; declined chemo; 08/06/2017- start Zytiga+prednisone; Stopped April 24th [rising PSA];  # may 2019- Xofigo #6 on 10/16]  # OCT 16th 2019- X-tandi  # Left hydronephrosis s/p stenting [Dr.Budzyn] --------------------------------------------   DIAGNOSIS: [ ]  castrate resistant prostate cancer  STAGE:  IV       ;GOALS: palliative  CURRENT/MOST RECENT THERAPY ; Xtandi     Prostate cancer metastatic to bone [facility])    ALLERGIES:  has No Known Allergies.  MEDICATIONS:  Current Outpatient Medications  Medication Sig Dispense Refill  . allopurinol (ZYLOPRIM) 300 MG tablet Take 300 mg by mouth daily.     Marland Kitchen aspirin 81 MG tablet Take 81 mg by mouth daily.     Marland Kitchen atorvastatin (LIPITOR) 10 MG tablet Take 10 mg by mouth daily with supper.     . carvedilol (COREG) 6.25 MG tablet Take 1 tablet (6.25 mg total) by mouth 2 (two) times daily with a meal. 60 tablet 2  . chlorthalidone (HYGROTON) 25 MG tablet Take 25 mg by mouth daily.     Marland Kitchen dexamethasone (DECADRON) 4 MG tablet Take 1 tablet (4 mg total) by mouth daily. 15 tablet 0  . enzalutamide (XTANDI) 40 MG capsule Take 4 capsules (160 mg total) by mouth daily. 120 capsule 4  . fentaNYL  (DURAGESIC - DOSED MCG/HR) 50 MCG/HR Place 1 patch (50 mcg total) onto the skin every 3 (three) days. 10 patch 0  . ferrous sulfate 325 (65 FE) MG tablet Take 325 mg by mouth daily with breakfast.     . hydrochlorothiazide (MICROZIDE) 12.5 MG capsule Take 12.5 mg by mouth daily.    Marland Kitchen HYDROcodone-acetaminophen (NORCO) 7.5-325 MG tablet Take 1 tablet by mouth every 6 (six) hours as needed for moderate pain or severe pain.     Marland Kitchen oxybutynin (DITROPAN-XL) 5 MG 24 hr tablet Take 1 tablet (5 mg total) by mouth 3 (three) times daily. 90 tablet 11  . pantoprazole (PROTONIX) 40 MG tablet Take 1 tablet (40 mg total) by mouth daily before breakfast. 30 tablet 2  . ranitidine (ZANTAC) 150 MG tablet Take 150 mg by mouth 2 (two) times daily.     . vitamin B-12 (CYANOCOBALAMIN) 1000 MCG tablet Take 1,000 mcg by mouth daily.    . vitamin C (ASCORBIC ACID) 500 MG tablet Take 500 mg by mouth daily.    Marland Kitchen VITAMIN D, ERGOCALCIFEROL, PO Take 5,000 Units by mouth  once a week.     No current facility-administered medications for this visit.     VITAL SIGNS: There were no vitals taken for this visit. There were no vitals filed for this visit.  Estimated body mass index is 24.83 kg/m as calculated from the following:   Height as of 08/28/18: 5' 11"  (1.803 m).   Weight as of 08/28/18: 178 lb (80.7 kg).  LABS: CBC:    Component Value Date/Time   WBC 6.1 09/25/2018 1008   HGB 12.5 (L) 09/25/2018 1008   HCT 39.5 09/25/2018 1008   PLT 169 09/25/2018 1008   MCV 91.4 09/25/2018 1008   NEUTROABS 4.1 09/25/2018 1008   LYMPHSABS 0.9 09/25/2018 1008   MONOABS 0.6 09/25/2018 1008   EOSABS 0.3 09/25/2018 1008   BASOSABS 0.1 09/25/2018 1008   Comprehensive Metabolic Panel:    Component Value Date/Time   NA 138 09/25/2018 1008   K 4.0 09/25/2018 1008   CL 106 09/25/2018 1008   CO2 24 09/25/2018 1008   BUN 33 (H) 09/25/2018 1008   CREATININE 1.13 09/25/2018 1008   GLUCOSE 175 (H) 09/25/2018 1008   CALCIUM 10.1  09/25/2018 1008   AST 20 09/25/2018 1008   ALT 11 09/25/2018 1008   ALKPHOS 76 09/25/2018 1008   BILITOT 0.4 09/25/2018 1008   PROT 7.5 09/25/2018 1008   ALBUMIN 3.9 09/25/2018 1008    RADIOGRAPHIC STUDIES: No results found.  PERFORMANCE STATUS (ECOG) : 3 - Symptomatic, >50% confined to bed  Review of Systems As noted above. Otherwise, a complete review of systems is negative.  Physical Exam General: NAD, frail appearing, thin Cardiovascular: regular rate and rhythm Pulmonary: clear ant fields Abdomen: soft, nontender, + bowel sounds GU: no suprapubic tenderness Extremities: no edema, no joint deformities Skin: no rashes Neurological: Weakness but otherwise nonfocal  IMPRESSION: I met with patient and his wife today in the clinic.  I introduced palliative care services and attempted to establish a therapeutic rapport.  Symptomatically, patient has had severe and persistent pain primarily in the right hip.  He was recently evaluated by orthopedics for pain in the left hip.  He had an MRI of the left hip which showed degenerative tear of the left anterior superior labrum but no metastatic disease.  Spinal metastases was noted with a lytic lesion to L4.  Patient is now being considered for further work-up that may include a bone scan.  Patient says the pain is most intense with movement as he tries to stand or ambulate.  He characterizes the pain as a sharp and stabbing sensation primarily focused in the right hip.  Pain is described as 10 out of 10 when he tries to stand but will reduce to 3 out of 10 with sitting.  The pain is affecting his ability to ambulate and impairing his quality of life.  Patient says he saw his PCP yesterday.  His Norco was increased to 7.5 mg and patient has been taking it 2-3 times a day.  Additionally, patient is on a transdermal fentanyl at 50 mcg.  Patient says nothing yet tried has been effective.  He cannot tell any benefit from the increased dose of  Norco.   Patient in agreement with rotating from [place] to oxycodone.  We will try to maximize PRN use of oxycodone and then increase his fentanyl dose accordingly.  We will also start trial of dexamethasone as pain likely has an inflammatory component.  We discussed CODE STATUS.  Patient says he would  not want his life prolonged artificially nor would he want to be resuscitated.  I completed a DNR form for him to take home.  Case and plan discussed with Dr. Rogue Bussing  PLAN: 1.  Continue current scope of treatment 2.  Discontinue Norco and start oxycodone IR 5 to 10 mg every 4 hours as needed 3.  Continue transdermal fentanyl 50 mcg every 72 hours with consideration of increasing dose if he continues to require frequent PRN opioids 4. Naloxone nasal spray kit 5.  Will start trial of dexamethasone 4 mg p.o. daily 6.  Patient is being considered for bone scan 7.  DNR form completed 8.  RTC in 1 month or sooner if needed   Patient expressed understanding and was in agreement with this plan. He also understands that He can call clinic at any time with any questions, concerns, or complaints.     Time Total: 30 minutes  Visit consisted of counseling and education dealing with the complex and emotionally intense issues of symptom management and palliative care in the setting of serious and potentially life-threatening illness.Greater than 50%  of this time was spent counseling and coordinating care related to the above assessment and plan.  Signed by: Altha Harm, PhD, NP-C [phone] (Work Cell)

## 2018-09-25 NOTE — Progress Notes (Signed)
[facility] OFFICE PROGRESS NOTE  Patient Care Team: Tamsen Roers, MD as PCP - General (Family Medicine) Abbie Sons, MD as Consulting Physician (Urology)  Cancer Staging No matching staging information was found for the patient.   Oncology History   # AUG 2016- METASTATIC PROSTATE CA/ STAGE IV; Castrate sensitive [PSA- 48] ; mets- lumbar spine [s/p pal RT to Aug 2016; Dr.Crystal] /Pelvic LN; 1994- Prostate CA s/p Surgery [facility]]; Lupron q4 M; MARCH 2017- Bone scan- L4/Right pubic rami uptake; PSA- 0.1/testosterone-castrate [<3]  # Bone lesions on denosumab;DEC 2016- Recm q 13M  # NOV 2018- CASTRATE RESISTANT; declined chemo; 08/06/2017- start Zytiga+prednisone; Stopped April 24th [rising PSA];  # may 2019- Xofigo #6 on 10/16]  # OCT 16th 2019- X-tandi  # Left hydronephrosis s/p stenting [Dr.Budzyn] --------------------------------------------   DIAGNOSIS: [ ]  castrate resistant prostate cancer  STAGE:  IV       ;GOALS: palliative  CURRENT/MOST RECENT THERAPY ; Xtandi     Prostate cancer metastatic to bone [facility])      INTERVAL HISTORY:  [name] 78 y.o.  male pleasant patient above history of castrate resistant prostate cancer is here for follow-up.    Patient continues to complain of worsening right hip pain.  He continues to be on fentanyl patch and hydrocodone.  Was recently evaluated PCP-needing extra hydrocodone.  Denies any falls.  No nausea no vomiting.  No headaches.  Review of Systems  Constitutional: Positive for malaise/fatigue. Negative for chills, diaphoresis, fever and weight loss.  HENT: Negative for nosebleeds and sore throat.   Eyes: Negative for double vision.  Respiratory: Negative for cough, hemoptysis, sputum production, shortness of breath and wheezing.   Cardiovascular: Negative for chest pain, palpitations and orthopnea.  Gastrointestinal: Negative for abdominal pain, blood in stool, constipation, diarrhea, heartburn,  melena, nausea and vomiting.  Genitourinary: Negative for dysuria, frequency and urgency.  Musculoskeletal: Positive for back pain and joint pain.  Skin: Negative.  Negative for itching and rash.  Neurological: Negative for dizziness, tingling, focal weakness, weakness and headaches.  Endo/Heme/Allergies: Does not bruise/bleed easily.  Psychiatric/Behavioral: Negative for depression. The patient is not nervous/anxious and does not have insomnia.       PAST MEDICAL HISTORY :  Past Medical History:  Diagnosis Date  . Aortic stenosis, moderate 01/30/2014  . Arthritis   . Atrial flutter ([place]) 02/02/2014  . Diabetes mellitus without complication (HCC)    diet control  . GERD (gastroesophageal reflux disease)   . Heart murmur   . Heart murmur   . History of kidney stones   . HTN (hypertension) 01/30/2014  . Hypercholesteremia   . Hypertension   . Prostate cancer ([place])    metastatic  . Weakness of left leg     PAST SURGICAL HISTORY :   Past Surgical History:  Procedure Laterality Date  . 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 CATARACT EXTRACTION W/ INTRAOCULAR LENS IMPLANT Bilateral   . COLON RESECTION    . CYSTOSCOPY W/ RETROGRADES Left 06/08/2015   Procedure: CYSTOSCOPY WITH RETROGRADE PYELOGRAM;  Surgeon: Nickie Retort, MD;  Location: [facility];  Service: Urology;  Laterality: Left;  . CYSTOSCOPY W/ RETROGRADES Left 05/13/2018   Procedure: CYSTOSCOPY WITH RETROGRADE PYELOGRAM;  Surgeon: Abbie Sons, MD;  Location: [facility];  Service: Urology;  Laterality: Left;  . [place]  Left 10/05/2015   Procedure: CYSTOSCOPY WITH STENT REPLACEMENT;  Surgeon: Nickie Retort, MD;  Location: [facility];  Service: Urology;  Laterality: Left;  . CYSTOSCOPY W/ URETERAL STENT PLACEMENT Left 01/04/2016   Procedure: CYSTOSCOPY WITH STENT  REPLACEMENT;  Surgeon: Nickie Retort, MD;  Location: [facility];  Service: Urology;  Laterality: Left;  . CYSTOSCOPY W/ URETERAL STENT PLACEMENT Left 04/11/2016   Procedure: CYSTOSCOPY WITH STENT REPLACEMENT;  Surgeon: Nickie Retort, MD;  Location: [facility];  Service: Urology;  Laterality: Left;  . CYSTOSCOPY W/ URETERAL STENT PLACEMENT Left 07/18/2016   Procedure: CYSTOSCOPY WITH STENT REPLACEMENT;  Surgeon: Nickie Retort, MD;  Location: [facility];  Service: Urology;  Laterality: Left;  . CYSTOSCOPY W/ URETERAL STENT PLACEMENT Left 11/02/2016   Procedure: CYSTOSCOPY WITH STENT REPLACEMENT;  Surgeon: Nickie Retort, MD;  Location: [facility];  Service: Urology;  Laterality: Left;  . CYSTOSCOPY W/ URETERAL STENT PLACEMENT Left 02/08/2017   Procedure: CYSTOSCOPY WITH STENT REPLACEMENT;  Surgeon: Nickie Retort, MD;  Location: [facility];  Service: Urology;  Laterality: Left;  . CYSTOSCOPY W/ URETERAL STENT PLACEMENT Left 05/10/2017   Procedure: CYSTOSCOPY WITH STENT REPLACEMENT;  Surgeon: Nickie Retort, MD;  Location: [facility];  Service: Urology;  Laterality: Left;  . CYSTOSCOPY W/ URETERAL STENT PLACEMENT Left 05/13/2018   Procedure: CYSTOSCOPY WITH STENT Exchange;  Surgeon: Abbie Sons, MD;  Location: [facility];  Service: Urology;  Laterality: Left;  . CYSTOSCOPY WITH STENT PLACEMENT Left 08/16/2017   Procedure: CYSTOSCOPY WITH STENT EXCHANGE;  Surgeon: Abbie Sons, MD;  Location: [facility];  Service: Urology;  Laterality: Left;  . CYSTOSCOPY WITH STENT PLACEMENT Left 12/17/2017   Procedure: CYSTOSCOPY WITH STENT EXCHANGE;  Surgeon: Abbie Sons, MD;  Location: [facility];  Service: Urology;  Laterality: Left;  . EYE SURGERY Bilateral Sept and Oct.2012   cataract  . HERNIA REPAIR    . PROSTATECTOMY  1994    FAMILY HISTORY :   Family History  Problem Relation Age of Onset  . Aneurysm Father   . Cancer Mother        breast    SOCIAL HISTORY:   Social History    Tobacco Use  . Smoking status: Former Smoker    Packs/day: 1.50    Years: 25.00    Pack years: 37.50    Types: Cigarettes    Last attempt to quit: 01/21/1974    Years since quitting: 44.7  . Smokeless tobacco: Never Used  Substance Use Topics  . Alcohol use: No  . Drug use: No    ALLERGIES:  has No Known Allergies.  MEDICATIONS:  Current Outpatient Medications  Medication Sig Dispense Refill  . allopurinol (ZYLOPRIM) 300 MG tablet Take 300 mg by mouth daily.     Marland Kitchen aspirin 81 MG tablet Take 81 mg by mouth daily.     Marland Kitchen atorvastatin (LIPITOR) 10 MG tablet Take 10 mg by mouth daily with supper.     . carvedilol (COREG) 6.25 MG tablet Take 1 tablet (6.25 mg total) by mouth 2 (two) times daily with a meal. 60 tablet 2  . chlorthalidone (HYGROTON) 25 MG tablet Take 25 mg by mouth daily.     . enzalutamide (XTANDI) 40 MG capsule Take 4 capsules (160 mg total) by mouth daily. 120 capsule 4  . fentaNYL (DURAGESIC - DOSED MCG/HR) 50 MCG/HR Place 1 patch (50 mcg total) onto the skin every 3 (three) days. 10 patch 0  . ferrous  sulfate 325 (65 FE) MG tablet Take 325 mg by mouth daily with breakfast.     . hydrochlorothiazide (MICROZIDE) 12.5 MG capsule Take 12.5 mg by mouth daily.    Marland Kitchen HYDROcodone-acetaminophen (NORCO) 7.5-325 MG tablet Take 1 tablet by mouth every 6 (six) hours as needed for moderate pain or severe pain.     Marland Kitchen oxybutynin (DITROPAN-XL) 5 MG 24 hr tablet Take 1 tablet (5 mg total) by mouth 3 (three) times daily. 90 tablet 11  . pantoprazole (PROTONIX) 40 MG tablet Take 1 tablet (40 mg total) by mouth daily before breakfast. 30 tablet 2  . ranitidine (ZANTAC) 150 MG tablet Take 150 mg by mouth 2 (two) times daily.     . vitamin B-12 (CYANOCOBALAMIN) 1000 MCG tablet Take 1,000 mcg by mouth daily.    . vitamin C (ASCORBIC ACID) 500 MG tablet Take 500 mg by mouth daily.    Marland Kitchen VITAMIN D, ERGOCALCIFEROL, PO Take 5,000 Units by mouth once a week.    Marland Kitchen dexamethasone (DECADRON) 4 MG  tablet Take 1 tablet (4 mg total) by mouth daily. 15 tablet 0  . naloxone (NARCAN) nasal spray 4 mg/0.1 mL For use with opioid overdose. 1 spray delivered by intranasal administration. Call 911 (EMS) if used. 1 kit 0  . oxyCODONE (OXY IR/ROXICODONE) 5 MG immediate release tablet Take 1-2 tablets (5-10 mg total) by mouth every 4 (four) hours as needed for moderate pain, severe pain or breakthrough pain. 45 tablet 0   No current facility-administered medications for this visit.     PHYSICAL EXAMINATION: ECOG PERFORMANCE STATUS: 1 - Symptomatic but completely ambulatory  BP 123/73 (BP Location: Left Arm, Patient Position: Sitting)   Pulse 76   Temp 97.8 F (36.6 C) (Tympanic)   Resp 20   There were no vitals filed for this visit.  Physical Exam  Constitutional: He is oriented to person, place, and time and well-developed, well-nourished, and in no distress.  In wheel chair.  Accompanied by his wife.  HENT:  Head: Normocephalic and atraumatic.  Mouth/Throat: Oropharynx is clear and moist. No oropharyngeal exudate.  Eyes: Pupils are equal, round, and reactive to light.  Neck: Normal range of motion. Neck supple.  Cardiovascular: Normal rate and regular rhythm.  Pulmonary/Chest: No respiratory distress. He has no wheezes.  Abdominal: Soft. Bowel sounds are normal. He exhibits no distension and no mass. There is no abdominal tenderness. There is no rebound and no guarding.  Musculoskeletal:        General: No tenderness or edema.  Neurological: He is alert and oriented to person, place, and time.  Skin: Skin is warm.  Psychiatric: Affect normal.       LABORATORY DATA:  I have reviewed the data as listed    Component Value Date/Time   NA 138 09/25/2018 1008   K 4.0 09/25/2018 1008   CL 106 09/25/2018 1008   CO2 24 09/25/2018 1008   GLUCOSE 175 (H) 09/25/2018 1008   BUN 33 (H) 09/25/2018 1008   CREATININE 1.13 09/25/2018 1008   CALCIUM 10.1 09/25/2018 1008   PROT 7.5  09/25/2018 1008   ALBUMIN 3.9 09/25/2018 1008   AST 20 09/25/2018 1008   ALT 11 09/25/2018 1008   ALKPHOS 76 09/25/2018 1008   BILITOT 0.4 09/25/2018 1008   GFRNONAA >60 09/25/2018 1008   GFRAA >60 09/25/2018 1008    No results found for: SPEP, UPEP  Lab Results  Component Value Date   WBC 6.1 09/25/2018  NEUTROABS 4.1 09/25/2018   HGB 12.5 (L) 09/25/2018   HCT 39.5 09/25/2018   MCV 91.4 09/25/2018   PLT 169 09/25/2018      Chemistry      Component Value Date/Time   NA 138 09/25/2018 1008   K 4.0 09/25/2018 1008   CL 106 09/25/2018 1008   CO2 24 09/25/2018 1008   BUN 33 (H) 09/25/2018 1008   CREATININE 1.13 09/25/2018 1008      Component Value Date/Time   CALCIUM 10.1 09/25/2018 1008   ALKPHOS 76 09/25/2018 1008   AST 20 09/25/2018 1008   ALT 11 09/25/2018 1008   BILITOT 0.4 09/25/2018 1008       RADIOGRAPHIC STUDIES: I have personally reviewed the radiological images as listed and agreed with the findings in the report. No results found.   ASSESSMENT & PLAN:  Prostate cancer metastatic to bone [facility]) # Metastatic castrate resistant prostate cancer-Lupron q 4 M [07/30/2018] and Xgeva;Marland Kitchen PSA-December 2019 improving/stable. On Xtandi.  Stable  #Clinically stable except worsening right hip pain see discussion below.  # Right hip pain- status post radiation; worsening; however bone scan September 2019 -no obvious evidence of progression.mRI left hip- no obvious mets; stable L4 disease. Await palliative care discussion today;  left message for [facility].   # Bone mets-on Xgeva monthly.  Calcium normal.  # CKD- stage III; creatinine 1.28 stable.   # DISPOSITION:  #  X-geva today;  # Follow up in 4 weeks/labs-cbc/cmp/psa/X-geva;-Dr.B  Addendum-discussed with Morley Kos.  Orthopedics; will proceed with PET scan.  Will inform patient.    No orders of the defined types were placed in this encounter.  All questions were answered. The patient knows to call the  clinic with any problems, questions or concerns.      Cammie Sickle, MD 09/25/2018 8:06 PM

## 2018-09-25 NOTE — Telephone Encounter (Signed)
[name]-please inform patient that I spoke to orthopedics.  We will plan a PET scan for further evaluation of the right hip pain.  Colette please schedule. Thx GB

## 2018-09-26 MED FILL — XTANDI 40 MG CAPSULE: 40 | 30 days supply | Qty: 120 | Fill #3

## 2018-09-29 ENCOUNTER — Other Ambulatory Visit: Payer: Self-pay | Admitting: *Deleted

## 2018-09-29 MED ORDER — DIAZEPAM 5 MG PO TABS: ORAL_TABLET | ORAL | 0 refills | Status: DC

## 2018-09-29 NOTE — Telephone Encounter (Signed)
Patient having PET Scan Thursday and needs a Valium to take prior to the scan so that he can get through having it done. Please advise

## 2018-09-29 NOTE — Telephone Encounter (Signed)
Pt aware of pet scan

## 2018-09-30 ENCOUNTER — Ambulatory Visit: Payer: Medicare HMO | Admitting: Urology

## 2018-09-30 ENCOUNTER — Encounter: Payer: Self-pay | Admitting: Urology

## 2018-09-30 VITALS — BP 134/80 | HR 80 | Ht 71.0 in | Wt 180.0 lb

## 2018-09-30 DIAGNOSIS — N135 Crossing vessel and stricture of ureter without hydronephrosis: Secondary | ICD-10-CM | POA: Diagnosis not present

## 2018-09-30 DIAGNOSIS — C7951 Secondary malignant neoplasm of bone: Secondary | ICD-10-CM | POA: Diagnosis not present

## 2018-09-30 DIAGNOSIS — C61 Malignant neoplasm of prostate: Secondary | ICD-10-CM

## 2018-09-30 MED ORDER — MIRABEGRON ER 25 MG PO TB24: 25.0000 mg | ORAL_TABLET | Freq: Every day | ORAL | 0 refills | Status: DC

## 2018-09-30 NOTE — Progress Notes (Signed)
09/30/2018 2:49 PM   [name] [birth year] [id number]  Referring provider: Tamsen Roers, [place], [place] [address]  Chief Complaint  Patient presents with  . Follow-up   Urologic history:  # AUG 2016- METASTATIC PROSTATE CA/ STAGE IV; Castrate sensitive [PSA- 48] ; mets- lumbar spine [s/p pal RT to Aug 2016; Dr.Crystal] /Pelvic LN; 1994- Prostate CA s/p Surgery [facility]]; Lupron q4 M; MARCH 2017- Bone scan- L4/Right pubic rami uptake; PSA- 0.1/testosterone-castrate [<3]  # Bone lesions on denosumab;DEC 2016- Recm q 18M  # NOV 2018- CASTRATE RESISTANT; declined chemo; 08/06/2017- start Zytiga+prednisone; Stopped April 24th [rising PSA];  # may 2019Trudi Long #6 on 10/16]  # OCT 16th 2019- X-tandi  # Left hydronephrosis s/p stenting last changed 04/2018     HPI: 78 year old male with metastatic 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  His stent was last exchanged in September 2019.  He does have urinary frequency, urgency and urge incontinence.  He takes oxybutynin 5 mg.  Denies fever or chills.   PMH: Past Medical History:  Diagnosis Date  . Aortic stenosis, moderate 01/30/2014  . Arthritis   . Atrial flutter ([place]) 02/02/2014  . Diabetes mellitus without complication (HCC)    diet control  . GERD (gastroesophageal reflux disease)   . Heart murmur   . Heart murmur   . History of kidney stones   . HTN (hypertension) 01/30/2014  . Hypercholesteremia   . Hypertension   . Prostate cancer ([place])    metastatic  . Weakness of left leg     Surgical History: Past Surgical History:  Procedure Laterality Date  . 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 CATARACT EXTRACTION W/ INTRAOCULAR LENS IMPLANT Bilateral   . COLON RESECTION    . CYSTOSCOPY W/  RETROGRADES Left 06/08/2015   Procedure: CYSTOSCOPY WITH RETROGRADE PYELOGRAM;  Surgeon: Nickie Retort, MD;  Location: [facility];  Service: Urology;  Laterality: Left;  . CYSTOSCOPY W/ RETROGRADES Left 05/13/2018   Procedure: CYSTOSCOPY WITH RETROGRADE PYELOGRAM;  Surgeon: Abbie Sons, MD;  Location: [facility];  Service: Urology;  Laterality: Left;  . CYSTOSCOPY W/ URETERAL STENT PLACEMENT Left 10/05/2015   Procedure: CYSTOSCOPY WITH STENT REPLACEMENT;  Surgeon: Nickie Retort, MD;  Location: [facility];  Service: Urology;  Laterality: Left;  . CYSTOSCOPY W/ URETERAL STENT PLACEMENT Left 01/04/2016   Procedure: CYSTOSCOPY WITH STENT REPLACEMENT;  Surgeon: Nickie Retort, MD;  Location: [facility];  Service: Urology;  Laterality: Left;  . CYSTOSCOPY W/ URETERAL STENT PLACEMENT Left 04/11/2016   Procedure: CYSTOSCOPY WITH STENT REPLACEMENT;  Surgeon: Nickie Retort, MD;  Location: [facility];  Service: Urology;  Laterality: Left;  . CYSTOSCOPY W/ URETERAL STENT PLACEMENT Left 07/18/2016   Procedure: CYSTOSCOPY WITH STENT REPLACEMENT;  Surgeon: Nickie Retort, MD;  Location: [facility];  Service: Urology;  Laterality: Left;  . CYSTOSCOPY W/ URETERAL STENT PLACEMENT Left 11/02/2016   Procedure: CYSTOSCOPY WITH STENT REPLACEMENT;  Surgeon: Nickie Retort, MD;  Location: [facility];  Service: Urology;  Laterality: Left;  . CYSTOSCOPY W/ URETERAL STENT PLACEMENT Left 02/08/2017   Procedure: CYSTOSCOPY WITH STENT REPLACEMENT;  Surgeon: Nickie Retort, MD;  Location: [facility];  Service: Urology;  Laterality: Left;  . CYSTOSCOPY W/ URETERAL STENT PLACEMENT Left 05/10/2017  Procedure: CYSTOSCOPY WITH STENT REPLACEMENT;  Surgeon: Nickie Retort, MD;  Location: [facility];  Service: Urology;  Laterality: Left;  . CYSTOSCOPY W/ URETERAL STENT PLACEMENT Left 05/13/2018   Procedure: CYSTOSCOPY WITH STENT Exchange;  Surgeon: Abbie Sons, MD;  Location: [facility];  Service: Urology;   Laterality: Left;  . CYSTOSCOPY WITH STENT PLACEMENT Left 08/16/2017   Procedure: CYSTOSCOPY WITH STENT EXCHANGE;  Surgeon: Abbie Sons, MD;  Location: [facility];  Service: Urology;  Laterality: Left;  . CYSTOSCOPY WITH STENT PLACEMENT Left 12/17/2017   Procedure: CYSTOSCOPY WITH STENT EXCHANGE;  Surgeon: Abbie Sons, MD;  Location: [facility];  Service: Urology;  Laterality: Left;  . EYE SURGERY Bilateral Sept and Oct.2012   cataract  . HERNIA REPAIR    . PROSTATECTOMY  1994    Home Medications:  Allergies as of 09/30/2018   No Known Allergies     Medication List       Accurate as of September 30, 2018  2:49 PM. Always use your most recent med list.        allopurinol 300 MG tablet Commonly known as:  ZYLOPRIM Take 300 mg by mouth daily.   aspirin 81 MG tablet Take 81 mg by mouth daily.   atorvastatin 10 MG tablet Commonly known as:  LIPITOR Take 10 mg by mouth daily with supper.   carvedilol 6.25 MG tablet Commonly known as:  COREG Take 1 tablet (6.25 mg total) by mouth 2 (two) times daily with a meal.   chlorthalidone 25 MG tablet Commonly known as:  HYGROTON Take 25 mg by mouth daily.   dexamethasone 4 MG tablet Commonly known as:  DECADRON Take 1 tablet (4 mg total) by mouth daily.   diazepam 5 MG tablet Commonly known as:  VALIUM One pill 45-60 mins prior to procedure; 1 pill 15 mins prior if needed.   enzalutamide 40 MG capsule Commonly known as:  XTANDI Take 4 capsules (160 mg total) by mouth daily.   fentaNYL 50 MCG/HR Commonly known as:  DURAGESIC Place 1 patch (50 mcg total) onto the skin every 3 (three) days.   ferrous sulfate 325 (65 FE) MG tablet Take 325 mg by mouth daily with breakfast.   hydrochlorothiazide 12.5 MG capsule Commonly known as:  MICROZIDE Take 12.5 mg by mouth daily.   HYDROcodone-acetaminophen 7.5-325 MG tablet Commonly known as:  NORCO Take 1 tablet by mouth every 6 (six) hours as needed for moderate pain or  severe pain.   naloxone 4 MG/0.1ML Liqd nasal spray kit Commonly known as:  NARCAN For use with opioid overdose. 1 spray delivered by intranasal administration. Call 911 (EMS) if used.   oxybutynin 5 MG 24 hr tablet Commonly known as:  DITROPAN-XL Take 1 tablet (5 mg total) by mouth 3 (three) times daily.   oxyCODONE 5 MG immediate release tablet Commonly known as:  Oxy IR/ROXICODONE Take 1-2 tablets (5-10 mg total) by mouth every 4 (four) hours as needed for moderate pain, severe pain or breakthrough pain.   pantoprazole 40 MG tablet Commonly known as:  PROTONIX Take 1 tablet (40 mg total) by mouth daily before breakfast.   ranitidine 150 MG tablet Commonly known as:  ZANTAC Take 150 mg by mouth 2 (two) times daily.   vitamin B-12 1000 MCG tablet Commonly known as:  CYANOCOBALAMIN Take 1,000 mcg by mouth daily.   vitamin C 500 MG tablet Commonly known as:  ASCORBIC ACID Take 500 mg by mouth daily.  VITAMIN D (ERGOCALCIFEROL) PO Take 5,000 Units by mouth once a week.       Allergies: No Known Allergies  Family History: Family History  Problem Relation Age of Onset  . Aneurysm Father   . Cancer Mother        breast    Social History:  reports that he quit smoking about 44 years ago. His smoking use included cigarettes. He has a 37.50 pack-year smoking history. He has never used smokeless tobacco. He reports that he does not drink alcohol or use drugs.  ROS: UROLOGY Frequent Urination?: Yes Hard to postpone urination?: No Burning/pain with urination?: No Get up at night to urinate?: Yes Leakage of urine?: Yes Urine stream starts and stops?: No Trouble starting stream?: No Do you have to strain to urinate?: No Blood in urine?: No Urinary tract infection?: No Sexually transmitted disease?: No Injury to kidneys or bladder?: No Painful intercourse?: No Weak stream?: No Erection problems?: No Penile pain?: No  Gastrointestinal Nausea?: No Vomiting?:  No Indigestion/heartburn?: No Diarrhea?: Yes Constipation?: No  Constitutional Fever: No Night sweats?: No Weight loss?: Yes Fatigue?: No  Skin Skin rash/lesions?: No Itching?: No  Eyes Blurred vision?: No Double vision?: No  Ears/Nose/Throat Sore throat?: No Sinus problems?: No  Hematologic/Lymphatic Swollen glands?: No Easy bruising?: Yes  Cardiovascular Leg swelling?: No Chest pain?: No  Respiratory Cough?: No Shortness of breath?: No  Endocrine Excessive thirst?: Yes  Musculoskeletal Back pain?: Yes Joint pain?: No  Neurological Headaches?: No Dizziness?: No  Psychologic Depression?: No Anxiety?: No  Physical Exam: BP 134/80 (BP Location: Left Arm, Patient Position: Sitting, Cuff Size: Normal)   Pulse 80   Ht 5' 11"  (1.803 m)   Wt 180 lb (81.6 kg)   BMI 25.10 kg/m   Constitutional:  Alert and oriented, No acute distress. HEENT: [place] AT, moist mucus membranes.  Trachea midline, no masses. Cardiovascular: No clubbing, cyanosis, or edema. RRR Respiratory: Normal respiratory effort, no increased work of breathing.  Clear GI: Abdomen is soft, nontender, nondistended, no abdominal masses GU: No CVA tenderness Lymph: No cervical or inguinal lymphadenopathy. Skin: No rashes, bruises or suspicious lesions. Neurologic: Grossly intact, no focal deficits, moving all 4 extremities. Psychiatric: Normal mood and affect.  Laboratory Data: Lab Results  Component Value Date   WBC 6.1 09/25/2018   HGB 12.5 (L) 09/25/2018   HCT 39.5 09/25/2018   MCV 91.4 09/25/2018   PLT 169 09/25/2018    Lab Results  Component Value Date   CREATININE 1.13 09/25/2018    Lab Results  Component Value Date   PSA 0.10 10/19/2016   PSA 0.02 06/28/2016   PSA 0.06 03/07/2016    Lab Results  Component Value Date   TESTOSTERONE <3 (L) 10/20/2015    Assessment & Plan:   78 year old male with metastatic castrate resistant prostate cancer and obstruction of the left  ureter.  He presents for scheduling of exchange of his left ureteral stent.  The procedure was again discussed in detail including potential risks of bleeding, infection/sepsis and ureteral injury.  He indicated all questions were answered and desires to proceed.  He was also given Myrbetriq 25 mg samples for his storage related voiding symptoms.   Abbie Sons, [place] [address] [place], [place] [address] [phone]

## 2018-10-02 ENCOUNTER — Other Ambulatory Visit: Payer: Medicare HMO

## 2018-10-09 ENCOUNTER — Other Ambulatory Visit: Payer: Self-pay | Admitting: Radiology

## 2018-10-09 ENCOUNTER — Ambulatory Visit
Admission: RE | Admit: 2018-10-09 | Discharge: 2018-10-09 | Disposition: A | Discharge status: Home / Self Care | Payer: Medicare HMO | Source: Ambulatory Visit | Attending: Internal Medicine | Admitting: Internal Medicine

## 2018-10-09 DIAGNOSIS — C61 Malignant neoplasm of prostate: Secondary | ICD-10-CM | POA: Insufficient documentation

## 2018-10-09 DIAGNOSIS — C7951 Secondary malignant neoplasm of bone: Secondary | ICD-10-CM | POA: Insufficient documentation

## 2018-10-09 DIAGNOSIS — N135 Crossing vessel and stricture of ureter without hydronephrosis: Secondary | ICD-10-CM

## 2018-10-09 MED ORDER — AXUMIN (FLUCICLOVINE F 18) INJECTION
10.4500 | Freq: Once | INTRAVENOUS | Status: AC
  Administered 2018-10-09: 10.45 via INTRAVENOUS

## 2018-10-23 ENCOUNTER — Inpatient Hospital Stay: Payer: Medicare HMO | Attending: Internal Medicine

## 2018-10-23 ENCOUNTER — Inpatient Hospital Stay (HOSPITAL_BASED_OUTPATIENT_CLINIC_OR_DEPARTMENT_OTHER): Payer: Medicare HMO | Admitting: Hospice and Palliative Medicine

## 2018-10-23 ENCOUNTER — Encounter: Payer: Self-pay | Admitting: Internal Medicine

## 2018-10-23 ENCOUNTER — Inpatient Hospital Stay: Payer: Medicare HMO

## 2018-10-23 ENCOUNTER — Other Ambulatory Visit: Payer: Self-pay

## 2018-10-23 ENCOUNTER — Other Ambulatory Visit: Payer: Self-pay | Admitting: *Deleted

## 2018-10-23 ENCOUNTER — Inpatient Hospital Stay (HOSPITAL_BASED_OUTPATIENT_CLINIC_OR_DEPARTMENT_OTHER): Payer: Medicare HMO | Admitting: Internal Medicine

## 2018-10-23 VITALS — BP 129/76 | HR 73 | Temp 97.3°F | Resp 18

## 2018-10-23 DIAGNOSIS — E7800 Pure hypercholesterolemia, unspecified: Secondary | ICD-10-CM

## 2018-10-23 DIAGNOSIS — K219 Gastro-esophageal reflux disease without esophagitis: Secondary | ICD-10-CM

## 2018-10-23 DIAGNOSIS — C7951 Secondary malignant neoplasm of bone: Secondary | ICD-10-CM

## 2018-10-23 DIAGNOSIS — Z79899 Other long term (current) drug therapy: Secondary | ICD-10-CM | POA: Diagnosis not present

## 2018-10-23 DIAGNOSIS — I4892 Unspecified atrial flutter: Secondary | ICD-10-CM | POA: Diagnosis not present

## 2018-10-23 DIAGNOSIS — Z923 Personal history of irradiation: Secondary | ICD-10-CM | POA: Diagnosis not present

## 2018-10-23 DIAGNOSIS — G893 Neoplasm related pain (acute) (chronic): Secondary | ICD-10-CM

## 2018-10-23 DIAGNOSIS — M129 Arthropathy, unspecified: Secondary | ICD-10-CM

## 2018-10-23 DIAGNOSIS — E119 Type 2 diabetes mellitus without complications: Secondary | ICD-10-CM | POA: Diagnosis not present

## 2018-10-23 DIAGNOSIS — Z87442 Personal history of urinary calculi: Secondary | ICD-10-CM

## 2018-10-23 DIAGNOSIS — Z515 Encounter for palliative care: Secondary | ICD-10-CM | POA: Insufficient documentation

## 2018-10-23 DIAGNOSIS — I10 Essential (primary) hypertension: Secondary | ICD-10-CM

## 2018-10-23 DIAGNOSIS — C61 Malignant neoplasm of prostate: Secondary | ICD-10-CM | POA: Diagnosis present

## 2018-10-23 DIAGNOSIS — I350 Nonrheumatic aortic (valve) stenosis: Secondary | ICD-10-CM | POA: Insufficient documentation

## 2018-10-23 DIAGNOSIS — R011 Cardiac murmur, unspecified: Secondary | ICD-10-CM | POA: Diagnosis not present

## 2018-10-23 LAB — COMPREHENSIVE METABOLIC PANEL
ALT: 12 U/L (ref 0–44)
AST: 19 U/L (ref 15–41)
Albumin: 3.8 g/dL (ref 3.5–5.0)
Alkaline Phosphatase: 68 U/L (ref 38–126)
Anion gap: 10 (ref 5–15)
BUN: 34 mg/dL — ABNORMAL HIGH (ref 8–23)
CO2: 25 mmol/L (ref 22–32)
CREATININE: 1.24 mg/dL (ref 0.61–1.24)
Calcium: 10.4 mg/dL — ABNORMAL HIGH (ref 8.9–10.3)
Chloride: 102 mmol/L (ref 98–111)
GFR calc Af Amer: >60 mL/min (ref >60)
GFR calc non Af Amer: 56 mL/min — ABNORMAL LOW (ref >60)
Glucose, Bld: 148 mg/dL — ABNORMAL HIGH (ref 70–99)
Potassium: 3.7 mmol/L (ref 3.5–5.1)
Sodium: 137 mmol/L (ref 135–145)
Total Bilirubin: 0.5 mg/dL (ref 0.3–1.2)
Total Protein: 7.5 g/dL (ref 6.5–8.1)

## 2018-10-23 LAB — CBC WITH DIFFERENTIAL/PLATELET
Abs Immature Granulocytes: 0.04 10*3/uL (ref 0.00–0.07)
BASOS PCT: 2 %
Basophils Absolute: 0.1 10*3/uL (ref 0.0–0.1)
EOS ABS: 0.3 10*3/uL (ref 0.0–0.5)
EOS PCT: 6 %
HCT: 40.3 % (ref 39.0–52.0)
Hemoglobin: 13 g/dL (ref 13.0–17.0)
Immature Granulocytes: 1 %
Lymphocytes Relative: 20 %
Lymphs Abs: 1.1 10*3/uL (ref 0.7–4.0)
MCH: 29.4 pg (ref 26.0–34.0)
MCHC: 32.3 g/dL (ref 30.0–36.0)
MCV: 91.2 fL (ref 80.0–100.0)
Monocytes Absolute: 0.6 10*3/uL (ref 0.1–1.0)
Monocytes Relative: 10 %
Neutro Abs: 3.5 10*3/uL (ref 1.7–7.7)
Neutrophils Relative %: 61 %
PLATELETS: 162 10*3/uL (ref 150–400)
RBC: 4.42 MIL/uL (ref 4.22–5.81)
RDW: 14.9 % (ref 11.5–15.5)
WBC: 5.7 10*3/uL (ref 4.0–10.5)
nRBC: 0 % (ref 0.0–0.2)

## 2018-10-23 LAB — PSA: Prostatic Specific Antigen: 4.42 ng/mL — ABNORMAL HIGH (ref 0.00–4.00)

## 2018-10-23 MED ORDER — FENTANYL 50 MCG/HR TD PT72: 1.0000 | MEDICATED_PATCH | TRANSDERMAL | 0 refills | Status: DC

## 2018-10-23 MED ORDER — DENOSUMAB 120 MG/1.7ML ~~LOC~~ SOLN
120.0000 mg | Freq: Once | SUBCUTANEOUS | Status: AC
  Administered 2018-10-23: 120 mg via SUBCUTANEOUS
  Filled 2018-10-23: qty 1.7

## 2018-10-23 MED FILL — XTANDI 40 MG CAPSULE: 40 | 30 days supply | Qty: 120 | Fill #4

## 2018-10-23 NOTE — Assessment & Plan Note (Addendum)
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 On Xtandi.  Stable.  #Clinically stable except worsening right hip pain see discussion below.  # Right hip pain- status post radiation; worsening; however bone scan September 2019 -no obvious evidence of progression.mRI left hip- no obvious mets.  Discussed with orthopedics/recommend follow-up with Ortho  # Bone mets-on Xgeva monthly.  Calcium normal.  Stable  # CKD- stage III; creatinine 1.28 stable  # DISPOSITION:  #  X-geva today;  # Follow up in 5 weeks/labs-cbc/cmp/psa/X-geva/Lurpon;-Dr.B  # 25 minutes face-to-face with the patient discussing the above plan of care; more than 50% of time spent on prognosis/ natural history; counseling and coordination.

## 2018-10-23 NOTE — Progress Notes (Signed)
[facility] OFFICE PROGRESS NOTE  Patient Care Team: Tamsen Roers, MD as PCP - General (Family Medicine) Abbie Sons, MD as Consulting Physician (Urology)  Cancer Staging No matching staging information was found for the patient.   Oncology History   # AUG 2016- METASTATIC PROSTATE CA/ STAGE IV; Castrate sensitive [PSA- 48] ; mets- lumbar spine [s/p pal RT to Aug 2016; Dr.Crystal] /Pelvic LN; 1994- Prostate CA s/p Surgery [facility]]; Lupron q4 M; MARCH 2017- Bone scan- L4/Right pubic rami uptake; PSA- 0.1/testosterone-castrate [<3]  # Bone lesions on denosumab;DEC 2016- Recm q 39M  # NOV 2018- CASTRATE RESISTANT; declined chemo; 08/06/2017- start Zytiga+prednisone; Stopped April 24th [rising PSA];  # may 2019- Xofigo #6 on 10/16]  # OCT 16th 2019- X-tandi  # Left hydronephrosis s/p stenting [Dr.Budzyn] --------------------------------------------   DIAGNOSIS: _0  castrate resistant prostate cancer  STAGE:  IV       ;GOALS: palliative  CURRENT/MOST RECENT THERAPY ; Xtandi     Prostate cancer metastatic to bone [facility])      INTERVAL HISTORY:  [name] 78 y.o.  male pleasant patient above history of castrate resistant prostate cancer is here for follow-up.    Patient continues to complain of worsening pain in his right hip.  Continues to be on Bentyl patch and hydrocodone.  No falls but no nausea no vomiting.   Review of Systems  Constitutional: Positive for malaise/fatigue. Negative for chills, diaphoresis, fever and weight loss.  HENT: Negative for nosebleeds and sore throat.   Eyes: Negative for double vision.  Respiratory: Negative for cough, hemoptysis, sputum production, shortness of breath and wheezing.   Cardiovascular: Negative for chest pain, palpitations and orthopnea.  Gastrointestinal: Negative for abdominal pain, blood in stool, constipation, diarrhea, heartburn, melena, nausea and vomiting.  Genitourinary: Negative for dysuria,  frequency and urgency.  Musculoskeletal: Positive for back pain and joint pain.  Skin: Negative.  Negative for itching and rash.  Neurological: Negative for dizziness, tingling, focal weakness, weakness and headaches.  Endo/Heme/Allergies: Does not bruise/bleed easily.  Psychiatric/Behavioral: Negative for depression. The patient is not nervous/anxious and does not have insomnia.       PAST MEDICAL HISTORY :  Past Medical History:  Diagnosis Date  . Aortic stenosis, moderate 01/30/2014  . Arthritis   . Atrial flutter ([place]) 02/02/2014  . Diabetes mellitus without complication (HCC)    diet control  . GERD (gastroesophageal reflux disease)   . Heart murmur   . Heart murmur   . History of kidney stones   . HTN (hypertension) 01/30/2014  . Hypercholesteremia   . Hypertension   . Prostate cancer ([place])    metastatic  . Weakness of left leg     PAST SURGICAL HISTORY :   Past Surgical History:  Procedure Laterality Date  . 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 CATARACT EXTRACTION W/ INTRAOCULAR LENS IMPLANT Bilateral   . COLON RESECTION    . CYSTOSCOPY W/ RETROGRADES Left 06/08/2015   Procedure: CYSTOSCOPY WITH RETROGRADE PYELOGRAM;  Surgeon: Nickie Retort, MD;  Location: [facility];  Service: Urology;  Laterality: Left;  . CYSTOSCOPY W/ RETROGRADES Left 05/13/2018   Procedure: CYSTOSCOPY WITH RETROGRADE PYELOGRAM;  Surgeon: Abbie Sons, MD;  Location: [facility];  Service: Urology;  Laterality: Left;  . CYSTOSCOPY W/ URETERAL STENT PLACEMENT Left 10/05/2015   Procedure: CYSTOSCOPY WITH STENT REPLACEMENT;  Surgeon: Nickie Retort, MD;  Location: [facility];  Service: Urology;  Laterality: Left;  . CYSTOSCOPY W/ URETERAL STENT PLACEMENT Left 01/04/2016   Procedure: CYSTOSCOPY WITH STENT REPLACEMENT;  Surgeon: Nickie Retort, MD;  Location: [facility];  Service:  Urology;  Laterality: Left;  . CYSTOSCOPY W/ URETERAL STENT PLACEMENT Left 04/11/2016   Procedure: CYSTOSCOPY WITH STENT REPLACEMENT;  Surgeon: Nickie Retort, MD;  Location: [facility];  Service: Urology;  Laterality: Left;  . CYSTOSCOPY W/ URETERAL STENT PLACEMENT Left 07/18/2016   Procedure: CYSTOSCOPY WITH STENT REPLACEMENT;  Surgeon: Nickie Retort, MD;  Location: [facility];  Service: Urology;  Laterality: Left;  . CYSTOSCOPY W/ URETERAL STENT PLACEMENT Left 11/02/2016   Procedure: CYSTOSCOPY WITH STENT REPLACEMENT;  Surgeon: Nickie Retort, MD;  Location: [facility];  Service: Urology;  Laterality: Left;  . CYSTOSCOPY W/ URETERAL STENT PLACEMENT Left 02/08/2017   Procedure: CYSTOSCOPY WITH STENT REPLACEMENT;  Surgeon: Nickie Retort, MD;  Location: [facility];  Service: Urology;  Laterality: Left;  . CYSTOSCOPY W/ URETERAL STENT PLACEMENT Left 05/10/2017   Procedure: CYSTOSCOPY WITH STENT REPLACEMENT;  Surgeon: Nickie Retort, MD;  Location: [facility];  Service: Urology;  Laterality: Left;  . CYSTOSCOPY W/ URETERAL STENT PLACEMENT Left 05/13/2018   Procedure: CYSTOSCOPY WITH STENT Exchange;  Surgeon: Abbie Sons, MD;  Location: [facility];  Service: Urology;  Laterality: Left;  . CYSTOSCOPY WITH STENT PLACEMENT Left 08/16/2017   Procedure: CYSTOSCOPY WITH STENT EXCHANGE;  Surgeon: Abbie Sons, MD;  Location: [facility];  Service: Urology;  Laterality: Left;  . CYSTOSCOPY WITH STENT PLACEMENT Left 12/17/2017   Procedure: CYSTOSCOPY WITH STENT EXCHANGE;  Surgeon: Abbie Sons, MD;  Location: [facility];  Service: Urology;  Laterality: Left;  . EYE SURGERY Bilateral Sept and Oct.2012   cataract  . HERNIA REPAIR    . PROSTATECTOMY  1994    FAMILY HISTORY :   Family History  Problem Relation Age of Onset  . Aneurysm Father   . Cancer Mother        breast    SOCIAL HISTORY:   Social History   Tobacco Use  . Smoking status: Former Smoker    Packs/day: 1.50     Years: 25.00    Pack years: 37.50    Types: Cigarettes    Last attempt to quit: 01/21/1974    Years since quitting: 44.8  . Smokeless tobacco: Never Used  Substance Use Topics  . Alcohol use: No  . Drug use: No    ALLERGIES:  has No Known Allergies.  MEDICATIONS:  Current Outpatient Medications  Medication Sig Dispense Refill  . allopurinol (ZYLOPRIM) 300 MG tablet Take 300 mg by mouth daily.     Marland Kitchen aspirin 81 MG tablet Take 81 mg by mouth daily.     Marland Kitchen atorvastatin (LIPITOR) 10 MG tablet Take 10 mg by mouth daily with supper.     . carvedilol (COREG) 6.25 MG tablet Take 1 tablet (6.25 mg total) by mouth 2 (two) times daily with a meal. 60 tablet 2  . chlorthalidone (HYGROTON) 25 MG tablet Take 25 mg by mouth daily.     . ferrous sulfate 325 (65 FE) MG tablet Take 325 mg by mouth daily with breakfast.     . hydrochlorothiazide (MICROZIDE) 12.5 MG capsule Take 12.5 mg by mouth daily.    . metFORMIN (GLUCOPHAGE) 500 MG tablet Take 500 mg by mouth daily.     Marland Kitchen oxybutynin (DITROPAN-XL) 5 MG  24 hr tablet Take 1 tablet (5 mg total) by mouth 3 (three) times daily. 90 tablet 11  . pantoprazole (PROTONIX) 40 MG tablet Take 1 tablet (40 mg total) by mouth daily before breakfast. 30 tablet 2  . ranitidine (ZANTAC) 150 MG tablet Take 150 mg by mouth at bedtime.     . vitamin B-12 (CYANOCOBALAMIN) 1000 MCG tablet Take 1,000 mcg by mouth daily.    . vitamin C (ASCORBIC ACID) 500 MG tablet Take 500 mg by mouth daily.    . Vitamin D, Ergocalciferol, (DRISDOL) 1.25 MG (50000 UT) CAPS capsule Take 5,000 Units by mouth once a week.     Marland Kitchen dexamethasone (DECADRON) 2 MG tablet Take 1 tablet (2 mg total) by mouth daily. 7 tablet 0  . diazepam (VALIUM) 5 MG tablet One pill 45-60 mins prior to procedure; 1 pill 15 mins prior if needed. (Patient not taking: Reported on 10/23/2018) 2 tablet 0  . fentaNYL (DURAGESIC) 50 MCG/HR Place 1 patch onto the skin every 3 (three) days. 10 patch 0  . mirabegron ER (MYRBETRIQ)  25 MG TB24 tablet Take 1 tablet (25 mg total) by mouth daily. 30 tablet 11  . naloxone (NARCAN) nasal spray 4 mg/0.1 mL For use with opioid overdose. 1 spray delivered by intranasal administration. Call 911 (EMS) if used. 1 kit 0  . oxyCODONE (OXY IR/ROXICODONE) 5 MG immediate release tablet Take 1-2 tablets (5-10 mg total) by mouth every 4 (four) hours as needed for moderate pain, severe pain or breakthrough pain. 45 tablet 0  . XTANDI 40 MG capsule TAKE 4 CAPSULES (160 MG TOTAL) BY MOUTH DAILY. 120 capsule 4   No current facility-administered medications for this visit.     PHYSICAL EXAMINATION: ECOG PERFORMANCE STATUS: 1 - Symptomatic but completely ambulatory  BP 129/76   Pulse 73   Temp (!) 97.3 F (36.3 C) (Tympanic)   Resp 18   There were no vitals filed for this visit.  Physical Exam  Constitutional: He is oriented to person, place, and time and well-developed, well-nourished, and in no distress.  In wheel chair.  Accompanied by his wife.  HENT:  Head: Normocephalic and atraumatic.  Mouth/Throat: Oropharynx is clear and moist. No oropharyngeal exudate.  Eyes: Pupils are equal, round, and reactive to light.  Neck: Normal range of motion. Neck supple.  Cardiovascular: Normal rate and regular rhythm.  Pulmonary/Chest: No respiratory distress. He has no wheezes.  Abdominal: Soft. Bowel sounds are normal. He exhibits no distension and no mass. There is no abdominal tenderness. There is no rebound and no guarding.  Musculoskeletal:        General: No tenderness or edema.  Neurological: He is alert and oriented to person, place, and time.  Skin: Skin is warm.  Psychiatric: Affect normal.       LABORATORY DATA:  I have reviewed the data as listed    Component Value Date/Time   NA 137 10/23/2018 0912   K 3.7 10/23/2018 0912   CL 102 10/23/2018 0912   CO2 25 10/23/2018 0912   GLUCOSE 148 (H) 10/23/2018 0912   BUN 34 (H) 10/23/2018 0912   CREATININE 1.24 10/23/2018 0912    CALCIUM 10.4 (H) 10/23/2018 0912   PROT 7.5 10/23/2018 0912   ALBUMIN 3.8 10/23/2018 0912   AST 19 10/23/2018 0912   ALT 12 10/23/2018 0912   ALKPHOS 68 10/23/2018 0912   BILITOT 0.5 10/23/2018 0912   GFRNONAA 56 (L) 10/23/2018 0912   GFRAA >60 10/23/2018  0912    No results found for: SPEP, UPEP  Lab Results  Component Value Date   WBC 5.7 10/23/2018   NEUTROABS 3.5 10/23/2018   HGB 13.0 10/23/2018   HCT 40.3 10/23/2018   MCV 91.2 10/23/2018   PLT 162 10/23/2018      Chemistry      Component Value Date/Time   NA 137 10/23/2018 0912   K 3.7 10/23/2018 0912   CL 102 10/23/2018 0912   CO2 25 10/23/2018 0912   BUN 34 (H) 10/23/2018 0912   CREATININE 1.24 10/23/2018 0912      Component Value Date/Time   CALCIUM 10.4 (H) 10/23/2018 0912   ALKPHOS 68 10/23/2018 0912   AST 19 10/23/2018 0912   ALT 12 10/23/2018 0912   BILITOT 0.5 10/23/2018 0912       RADIOGRAPHIC STUDIES: I have personally reviewed the radiological images as listed and agreed with the findings in the report. No results found.   ASSESSMENT & PLAN:  Prostate cancer metastatic to bone [facility]) # Metastatic castrate resistant prostate cancer-Lupron q 4 M [07/30/2018] and Xgeva;Marland Kitchen PSA-December 2019 improving/stable. On Xtandi.  Stable.  #Clinically stable except worsening right hip pain see discussion below.  # Right hip pain- status post radiation; worsening; however bone scan September 2019 -no obvious evidence of progression.mRI left hip- no obvious mets.  Discussed with orthopedics/recommend follow-up with Ortho  # Bone mets-on Xgeva monthly.  Calcium normal.  Stable  # CKD- stage III; creatinine 1.28 stable  # DISPOSITION:  #  X-geva today;  # Follow up in 5 weeks/labs-cbc/cmp/psa/X-geva/Lurpon;-Dr.B   Orders Placed This Encounter  Procedures  . CBC    Standing Status:   Future    Standing Expiration Date:   10/23/2019  . Comprehensive metabolic panel    Standing Status:   Future     Standing Expiration Date:   10/23/2019  . PSA    Standing Status:   Future    Standing Expiration Date:   10/23/2019   All questions were answered. The patient knows to call the clinic with any problems, questions or concerns.      Cammie Sickle, MD 11/28/2018 11:04 AM

## 2018-10-23 NOTE — Progress Notes (Signed)
Patient is requesting RF on fentanyl patches. He has been out for approx. 3 days. He and his wife stated that "didn't know to notify anyone when he was out." pt states that he has not started the oxycodone. He took the norco (He believes) at 8:30 am today. "it was the previous pain medication my primary care had given me. I don't think I started on the new one that was given to me last time by that new doctor. I didn't want to do that without talking to Dr. B first about it." pt's wife did not understand why narcan was prescribed.   pt / pt's wife educated that it's the patient's responsibility to contact our office before 'running out of medications." Also educated patient and pt's wife on pain control. Pt is not consistent when taking his pain medications. He only takes the "narcotics once every now then." States that his "pain is never under control."  I explained that maintaining a consistent threshold of narcotics in his system is important to help him manage his pain. Pt education provided to pt's and pt's wife regarding narcan- the purpose, and role and when to active the medication. Wife expressed that she is nervous about her husband starting this new medication. "what if he has a reaction if I'm not in the home. Who would give him the narcan then." I explained to the patient/pt's wife that the role of the narcan is to revive the patient if and only if her husband is unconscious and there is a suspected reason of overdose of narcotics. I explained to her that this medication is routinely prescribe for that reason only to help reverse an opioid overdose.  Wife gave verbal understanding and understands that she would call 911 in the event she would find her husband unconscious.

## 2018-10-23 NOTE — Progress Notes (Signed)
[facility]  Telephone:([phone] [phone]   Name: [name] Date: 10/23/2018 MRN: [id number]  DOB: October 09, 1940  Patient Care Team: Tamsen Roers, MD as PCP - General (Family Medicine) Bernardo Heater, Ronda Fairly, MD as Consulting Physician (Urology)    REASON FOR CONSULTATION: Palliative Care consult requested for this 78 y.o. 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  PMH also notable for hydronephrosis status post stenting, moderate aortic stenosis, hypertension, diabetes.  Patient has had severe hip pain.  He was referred to palliative care for help with symptom management and to address goals.  SOCIAL HISTORY:    Patient is married and lives at home with his wife.  He has several adult children.  He is a retired Patent examiner for [employer].  ADVANCE DIRECTIVES:  Not on file  CODE STATUS: DNR  PAST MEDICAL HISTORY: Past Medical History:  Diagnosis Date  . Aortic stenosis, moderate 01/30/2014  . Arthritis   . Atrial flutter ([place]) 02/02/2014  . Diabetes mellitus without complication (HCC)    diet control  . GERD (gastroesophageal reflux disease)   . Heart murmur   . Heart murmur   . History of kidney stones   . HTN (hypertension) 01/30/2014  . Hypercholesteremia   . Hypertension   . Prostate cancer ([place])    metastatic  . Weakness of left leg     PAST SURGICAL HISTORY:  Past Surgical History:  Procedure Laterality Date  . 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 CATARACT EXTRACTION W/ INTRAOCULAR LENS IMPLANT Bilateral   . COLON RESECTION    . CYSTOSCOPY W/ RETROGRADES Left 06/08/2015   Procedure: CYSTOSCOPY WITH RETROGRADE PYELOGRAM;  Surgeon: Nickie Retort, MD;  Location: [facility];  Service:  Urology;  Laterality: Left;  . CYSTOSCOPY W/ RETROGRADES Left 05/13/2018   Procedure: CYSTOSCOPY WITH RETROGRADE PYELOGRAM;  Surgeon: Abbie Sons, MD;  Location: [facility];  Service: Urology;  Laterality: Left;  . CYSTOSCOPY W/ URETERAL STENT PLACEMENT Left 10/05/2015   Procedure: CYSTOSCOPY WITH STENT REPLACEMENT;  Surgeon: Nickie Retort, MD;  Location: [facility];  Service: Urology;  Laterality: Left;  . CYSTOSCOPY W/ URETERAL STENT PLACEMENT Left 01/04/2016   Procedure: CYSTOSCOPY WITH STENT REPLACEMENT;  Surgeon: Nickie Retort, MD;  Location: [facility];  Service: Urology;  Laterality: Left;  . CYSTOSCOPY W/ URETERAL STENT PLACEMENT Left 04/11/2016   Procedure: CYSTOSCOPY WITH STENT REPLACEMENT;  Surgeon: Nickie Retort, MD;  Location: [facility];  Service: Urology;  Laterality: Left;  . CYSTOSCOPY W/ URETERAL STENT PLACEMENT Left 07/18/2016   Procedure: CYSTOSCOPY WITH STENT REPLACEMENT;  Surgeon: Nickie Retort, MD;  Location: [facility];  Service: Urology;  Laterality: Left;  . CYSTOSCOPY W/ URETERAL STENT PLACEMENT Left 11/02/2016   Procedure: CYSTOSCOPY WITH STENT REPLACEMENT;  Surgeon: Nickie Retort, MD;  Location: [facility];  Service: Urology;  Laterality: Left;  . CYSTOSCOPY W/ URETERAL STENT PLACEMENT Left 02/08/2017   Procedure: CYSTOSCOPY WITH STENT REPLACEMENT;  Surgeon: Nickie Retort, MD;  Location: [facility];  Service: Urology;  Laterality: Left;  . CYSTOSCOPY W/ URETERAL STENT PLACEMENT Left 05/10/2017   Procedure: CYSTOSCOPY WITH STENT REPLACEMENT;  Surgeon: Nickie Retort, MD;  Location: [facility];  Service: Urology;  Laterality: Left;  .  CYSTOSCOPY W/ URETERAL STENT PLACEMENT Left 05/13/2018   Procedure: CYSTOSCOPY WITH STENT Exchange;  Surgeon: Abbie Sons, MD;  Location: [facility];  Service: Urology;  Laterality: Left;  . CYSTOSCOPY WITH STENT PLACEMENT Left 08/16/2017   Procedure: CYSTOSCOPY WITH STENT EXCHANGE;  Surgeon: Abbie Sons, MD;   Location: [facility];  Service: Urology;  Laterality: Left;  . CYSTOSCOPY WITH STENT PLACEMENT Left 12/17/2017   Procedure: CYSTOSCOPY WITH STENT EXCHANGE;  Surgeon: Abbie Sons, MD;  Location: [facility];  Service: Urology;  Laterality: Left;  . EYE SURGERY Bilateral Sept and Oct.2012   cataract  . HERNIA REPAIR    . PROSTATECTOMY  1994    HEMATOLOGY/ONCOLOGY HISTORY:  Oncology History   # AUG 2016- METASTATIC PROSTATE CA/ STAGE IV; Castrate sensitive [PSA- 48] ; mets- lumbar spine [s/p pal RT to Aug 2016; Dr.Crystal] /Pelvic LN; 1994- Prostate CA s/p Surgery [facility]]; Lupron q4 M; MARCH 2017- Bone scan- L4/Right pubic rami uptake; PSA- 0.1/testosterone-castrate [<3]  # Bone lesions on denosumab;DEC 2016- Recm q 11M  # NOV 2018- CASTRATE RESISTANT; declined chemo; 08/06/2017- start Zytiga+prednisone; Stopped April 24th [rising PSA];  # may 2019- Xofigo #6 on 10/16]  # OCT 16th 2019- X-tandi  # Left hydronephrosis s/p stenting [Dr.Budzyn] --------------------------------------------   DIAGNOSIS: [ ] castrate resistant prostate cancer  STAGE:  IV       ;GOALS: palliative  CURRENT/MOST RECENT THERAPY ; Xtandi     Prostate cancer metastatic to bone [facility])    ALLERGIES:  has No Known Allergies.  MEDICATIONS:  Current Outpatient Medications  Medication Sig Dispense Refill  . allopurinol (ZYLOPRIM) 300 MG tablet Take 300 mg by mouth daily.     Marland Kitchen aspirin 81 MG tablet Take 81 mg by mouth daily.     Marland Kitchen atorvastatin (LIPITOR) 10 MG tablet Take 10 mg by mouth daily with supper.     . carvedilol (COREG) 6.25 MG tablet Take 1 tablet (6.25 mg total) by mouth 2 (two) times daily with a meal. 60 tablet 2  . chlorthalidone (HYGROTON) 25 MG tablet Take 25 mg by mouth daily.     Marland Kitchen dexamethasone (DECADRON) 4 MG tablet Take 1 tablet (4 mg total) by mouth daily. 15 tablet 0  . diazepam (VALIUM) 5 MG tablet One pill 45-60 mins prior to procedure; 1 pill 15 mins prior if needed. 2 tablet 0  .  enzalutamide (XTANDI) 40 MG capsule Take 4 capsules (160 mg total) by mouth daily. 120 capsule 4  . fentaNYL (DURAGESIC - DOSED MCG/HR) 50 MCG/HR Place 1 patch (50 mcg total) onto the skin every 3 (three) days. 10 patch 0  . ferrous sulfate 325 (65 FE) MG tablet Take 325 mg by mouth daily with breakfast.     . hydrochlorothiazide (MICROZIDE) 12.5 MG capsule Take 12.5 mg by mouth daily.    Marland Kitchen HYDROcodone-acetaminophen (NORCO) 7.5-325 MG tablet Take 1 tablet by mouth every 6 (six) hours as needed for moderate pain or severe pain.     . mirabegron ER (MYRBETRIQ) 25 MG TB24 tablet Take 1 tablet (25 mg total) by mouth daily for 28 days. 28 tablet 0  . naloxone (NARCAN) nasal spray 4 mg/0.1 mL For use with opioid overdose. 1 spray delivered by intranasal administration. Call 911 (EMS) if used. 1 kit 0  . oxybutynin (DITROPAN-XL) 5 MG 24 hr tablet Take 1 tablet (5 mg total) by mouth 3 (three) times daily. 90 tablet 11  . oxyCODONE (OXY IR/ROXICODONE) 5 MG immediate  release tablet Take 1-2 tablets (5-10 mg total) by mouth every 4 (four) hours as needed for moderate pain, severe pain or breakthrough pain. 45 tablet 0  . pantoprazole (PROTONIX) 40 MG tablet Take 1 tablet (40 mg total) by mouth daily before breakfast. 30 tablet 2  . ranitidine (ZANTAC) 150 MG tablet Take 150 mg by mouth 2 (two) times daily.     . vitamin B-12 (CYANOCOBALAMIN) 1000 MCG tablet Take 1,000 mcg by mouth daily.    . vitamin C (ASCORBIC ACID) 500 MG tablet Take 500 mg by mouth daily.    Marland Kitchen VITAMIN D, ERGOCALCIFEROL, PO Take 5,000 Units by mouth once a week.     No current facility-administered medications for this visit.     VITAL SIGNS: There were no vitals taken for this visit. There were no vitals filed for this visit.  Estimated body mass index is 25.1 kg/m as calculated from the following:   Height as of 09/30/18: 5' 11" (1.803 m).   Weight as of 09/30/18: 180 lb (81.6 kg).  LABS: CBC:    Component Value Date/Time   WBC  5.7 10/23/2018 0912   HGB 13.0 10/23/2018 0912   HCT 40.3 10/23/2018 0912   PLT 162 10/23/2018 0912   MCV 91.2 10/23/2018 0912   NEUTROABS 3.5 10/23/2018 0912   LYMPHSABS 1.1 10/23/2018 0912   MONOABS 0.6 10/23/2018 0912   EOSABS 0.3 10/23/2018 0912   BASOSABS 0.1 10/23/2018 0912   Comprehensive Metabolic Panel:    Component Value Date/Time   NA 137 10/23/2018 0912   K 3.7 10/23/2018 0912   CL 102 10/23/2018 0912   CO2 25 10/23/2018 0912   BUN 34 (H) 10/23/2018 0912   CREATININE 1.24 10/23/2018 0912   GLUCOSE 148 (H) 10/23/2018 0912   CALCIUM 10.4 (H) 10/23/2018 0912   AST 19 10/23/2018 0912   ALT 12 10/23/2018 0912   ALKPHOS 68 10/23/2018 0912   BILITOT 0.5 10/23/2018 0912   PROT 7.5 10/23/2018 0912   ALBUMIN 3.8 10/23/2018 0912    RADIOGRAPHIC STUDIES: Nm Pet (axumin) Skull Base To Mid Thigh  Result Date: 10/09/2018 CLINICAL DATA:  Prostate cancer with bone metastasis. EXAM: NUCLEAR MEDICINE PET SKULL BASE TO THIGH TECHNIQUE: 10.45 mCi F-18 Fluciclovine was injected intravenously. Full-ring PET imaging was performed from the skull base to thigh after the radiotracer. CT data was obtained and used for attenuation correction and anatomic localization. COMPARISON:  Bone scan 05/07/2018, PET-CT 08/08/2017 FINDINGS: NECK No radiotracer activity in neck lymph nodes. Incidental CT finding: None CHEST No radiotracer accumulation within mediastinal or hilar lymph nodes. No suspicious pulmonary nodules on the CT scan. Incidental CT finding: None ABDOMEN/PELVIS Prostate: No focal activity in the prostate bed. Lymph nodes: No abnormal radiotracer accumulation within pelvic or abdominal nodes. Liver: No evidence of liver metastasis Incidental CT finding: None SKELETON Again demonstrate intense activity associated expansile lesion associated pathologic fracture of the RIGHT posterior seventh rib. The soft tissue and sclerotic component are increase with the combined size equal 29 x 21 mm  increased from 26 by 15 mm. The lesion remains radiotracer avid with SUV max equal 6.7 compared to 6.3. The T9 vertebral body lesion is less conspicuous on today's exam. The sclerotic portion of the lesion is increased while the radiotracer activity is decreased with SUV max equal 6.6 compared 8.7. No new skeletal metastasis are present. IMPRESSION: 1. No evidence of local nodal recurrence in the pelvis. No distant nodal metastatic disease. No solid organ involvement. 2.  Mild increase in size and radiotracer activity of expansile lesion associated with the posterior RIGHT seventh rib consistent prostate cancer metastasis. 3. Interval decrease in radiotracer activity of the T9 metastatic lesion with increase in the sclerotic portion of the tumor. 4. No evidence of new skeletal metastasis. Electronically Signed   By: Suzy Bouchard M.D.   On: 10/09/2018 16:15    PERFORMANCE STATUS (ECOG) : 3 - Symptomatic, >50% confined to bed  Review of Systems As noted above. Otherwise, a complete review of systems is negative.  Physical Exam General: NAD, frail appearing, thin Cardiovascular: regular rate and rhythm Pulmonary: clear ant fields Abdomen: soft, nontender, + bowel sounds GU: no suprapubic tenderness Extremities: no edema, no joint deformities Skin: no rashes Neurological: Weakness but otherwise nonfocal  IMPRESSION: I met with patient and his wife today in the clinic.    Patient continues to have persistent pain in the right hip.  PET on 10/09/2018 showed no evidence of local nodal recurrence in the pelvis, mild increase in activity the posterior right seventh rib, decrease in activity in the T9 metastatic lesion but increased sclerotic portion of the tumor, and no evidence of new skeletal metastases.  Patient says he never started the oxycodone.  He is continued to take the Norco once or twice a day but does not find it to be particularly helpful.  He says he has been out of the fentanyl since  last week but never informed us of this.  We discussed his pain and analgesic regimen at length.  I suggested that he try the oxycodone 1 to 2 tablets every 4 hours as needed for breakthrough pain.  We will refill his fentanyl today.  I offered a visit for follow-up in 1 to 2 weeks but that was declined.  We will see him back in a month but will have home-based palliative care see him in the interim.  Patient did not report any difference with trial of dexamethasone.  PLAN: 1.  Continue current scope of treatment 2.  Discontinue Norco and start oxycodone IR 5 to 10 mg every 4 hours as needed 3.  Transdermal fentanyl 50 mcg every 72 hours (Rx #10) 4.  Amb referral for home palliative care 5.  RTC in 1 month or sooner if needed   Patient expressed understanding and was in agreement with this plan. He also understands that He can call clinic at any time with any questions, concerns, or complaints.     Time Total: 30 minutes  Visit consisted of counseling and education dealing with the complex and emotionally intense issues of symptom management and palliative care in the setting of serious and potentially life-threatening illness.Greater than 50%  of this time was spent counseling and coordinating care related to the above assessment and plan.  Signed by: Altha Harm, PhD, NP-C [phone] (Work Cell)

## 2018-10-24 ENCOUNTER — Other Ambulatory Visit: Payer: Self-pay | Admitting: *Deleted

## 2018-10-24 MED ORDER — OXYCODONE HCL 5 MG PO TABS: 5.0000 mg | ORAL_TABLET | ORAL | 0 refills | Status: DC | PRN

## 2018-10-28 ENCOUNTER — Other Ambulatory Visit: Payer: Self-pay

## 2018-10-28 ENCOUNTER — Encounter
Admission: RE | Admit: 2018-10-28 | Discharge: 2018-10-28 | Disposition: A | Discharge status: Home / Self Care | Payer: Medicare HMO | Source: Ambulatory Visit | Attending: Urology | Admitting: Urology

## 2018-10-28 ENCOUNTER — Telehealth: Payer: Self-pay | Admitting: Urology

## 2018-10-28 DIAGNOSIS — Z01812 Encounter for preprocedural laboratory examination: Secondary | ICD-10-CM | POA: Diagnosis not present

## 2018-10-28 MED ORDER — MIRABEGRON ER 25 MG PO TB24: 25.0000 mg | ORAL_TABLET | Freq: Every day | ORAL | 11 refills | Status: DC

## 2018-10-28 NOTE — Patient Instructions (Signed)
Your procedure is scheduled on: Tuesday 11/11/18 Report to [facility]. To find out your arrival time please call [phone] between 1PM - 3PM on Monday 11/10/18.  Remember: Instructions that are not followed completely may result in serious medical risk, up to and including death, or upon the discretion of your surgeon and anesthesiologist your surgery may need to be rescheduled.     _X__ 1. Do not eat food after midnight the night before your procedure.                 No gum chewing or hard candies. You may drink clear liquids up to 2 hours                 before you are scheduled to arrive for your surgery- DO not drink clear                 liquids within 2 hours of the start of your surgery.                 Clear Liquids include:  water, apple juice without pulp, clear carbohydrate                 drink such as Clearfast or Gatorade, Black Coffee or Tea (Do not add                 anything to coffee or tea).  __X__2.  On the morning of surgery brush your teeth with toothpaste and water, you                 may rinse your mouth with mouthwash if you wish.  Do not swallow any              toothpaste of mouthwash.     _X__ 3.  No Alcohol for 24 hours before or after surgery.   _X__ 4.  Do Not Smoke or use e-cigarettes For 24 Hours Prior to Your Surgery.                 Do not use any chewable tobacco products for at least 6 hours prior to                 surgery.  ____  5.  Bring all medications with you on the day of surgery if instructed.   __X__  6.  Notify your doctor if there is any change in your medical condition      (cold, fever, infections).     Do not wear jewelry, make-up, hairpins, clips or nail polish. Do not wear lotions, powders, or perfumes.  Do not shave 48 hours prior to surgery. Men may shave face and neck. Do not bring valuables to the hospital.    [facility] is not responsible for any belongings or  valuables.  Contacts, dentures/partials or body piercings may not be worn into surgery. Bring a case for your contacts, glasses or hearing aids, a denture cup will be supplied. Leave your suitcase in the car. After surgery it may be brought to your room. For patients admitted to the hospital, discharge time is determined by your treatment team.   Patients discharged the day of surgery will not be allowed to drive home.   Please read over the following fact sheets that you were given:   MRSA Information  __X__ Take these medicines the morning of surgery with A SIP OF WATER:  1. allopurinol (ZYLOPRIM)  2. carvedilol (COREG  3. mirabegron ER (MYRBETRIQ  4. oxybutynin (DITROPAN  5. pantoprazole (PROTONIX  6.  ____ Fleet Enema (as directed)   ____ Use CHG Soap/SAGE wipes as directed  ____ Use inhalers on the day of surgery  ____ Stop metformin/Janumet/Farxiga 2 days prior to surgery    ____ Take 1/2 of usual insulin dose the night before surgery. No insulin the morning          of surgery.   ____ Stop Blood Thinners Coumadin/Plavix/Xarelto/Pleta/Pradaxa/Eliquis/Effient/Aspirin  on   Or contact your Surgeon, Cardiologist or Medical Doctor regarding  ability to stop your blood thinners  __X__ Stop Anti-inflammatories 7 days before surgery such as Advil, Ibuprofen, Motrin,  BC or Goodies Powder, Naprosyn, Naproxen, Aleve   __X__ Stop all herbal supplements, fish oil or vitamin E for 7 days until after surgery.  Stop vitamin c  ____ Bring C-Pap to the hospital.

## 2018-10-28 NOTE — Telephone Encounter (Signed)
Pt called and asked for a refill for Myrbetriq

## 2018-10-28 NOTE — Telephone Encounter (Signed)
Myrbetriq refill sent to pharmacy 

## 2018-10-28 NOTE — Addendum Note (Signed)
Addended by: Feliberto Gottron on: 10/28/2018 10:56 AM   Modules accepted: Orders

## 2018-10-29 ENCOUNTER — Other Ambulatory Visit: Payer: Medicare HMO | Admitting: Student

## 2018-10-29 DIAGNOSIS — Z515 Encounter for palliative care: Secondary | ICD-10-CM

## 2018-10-29 LAB — URINE CULTURE: Culture: NO GROWTH

## 2018-10-29 NOTE — Progress Notes (Signed)
[facility] Consult Note Telephone: [phone]  Fax: [phone]  PATIENT NAME: [name] DOB: 1941/01/14 MRN: [id number]  PRIMARY CARE PROVIDER:   Tamsen Roers, MD  REFERRING PROVIDER: Altha Harm, NP  RESPONSIBLE PARTY: Self    ASSESSMENT: Met with [name] and wife [name]. He is alert and oriented x 3. Explained role of Palliative Medicine in the home; he is welcome to visits. We discussed goals of care; we discussed symptom management. He is encouraged to take two oxycodone and see if this helps with pain management; will monitor for effective. Will recommend increase in fentanyl patch dosage if pain does not improve with taking oxycodone prn. He would like to remain at home; he is open to hospitalizations as needed. We discussed code status; he is a DNR.      RECOMMENDATIONS and PLAN:  1. Code status: DNR; in the home. 2. Medical goals of therapy: continue Xtandi as directed. Follow up with Dr. Rogue Bussing as scheduled. Palliative Medicine will monitor for changes/declines; provide symptom management as needed. 3. Symptom management: pain-continue fentanyl patch 69mg every 72 hours; oxycodone 571m1-2 tabs every 4 hours as needed; continue dexamethasone 30m16maily. 4. Discharge Planning: Mr. RooCabinessll continue to reside at home with his wife.  5. Emotional support: discussed with [name]; they are encouraged to call with questions.   Palliative Medicine to follow up in 3 weeks or sooner, if needed.  I spent 45 minutes providing this consultation,  from 11:00am to 11:45am. More than 50% of the time in this consultation was spent coordinating communication.   HISTORY OF PRESENT ILLNESS:  [name] a 77 6o. year old male with multiple medical problems including stage 4 prostate cancer, metastatic to bone. He is post radiation therapy and surgery. He is currently taking Xtandi. Diagnoses also include hydronephrosis,  status post stenting, aortic stenosis, hypertension and diabetes. Palliative Care was asked to help address goals of care. He currently lives with his wife. He states he is able to perform adl's such as bathing dressing and grooming. He uses cane for ambulation. He denies need for any other equipment in the home. He denies pain at rest; pain to back and hip is up to a 10/10 at times when he is moving about. He has fentanyl patch on and has started taking prn oxycodone; he has been taking one tablet 1 to 2 times a day. He is encouraged to take two tablets to see if this helps better with his pain. Wife ConMarlowe Kaysates he is to receive cortisone injections soon to help with pain as well. He is scheduled for  Uretal stent replacement on 11/11/18. He denies shortness of breath, nausea, constipation. He takes melatonin 21m41ms; he states he is sleeping around 7 hours; denies need for further intervention at this time. He reports a good appetite.   CODE STATUS: DNR  PPS: 60% HOSPICE ELIGIBILITY/DIAGNOSIS: TBD  PAST MEDICAL HISTORY:  Past Medical History:  Diagnosis Date  . Aortic stenosis, moderate 01/30/2014  . Arthritis   . Atrial flutter (HCC)[place]16/2015  . Diabetes mellitus without complication (HCC)    diet control  . GERD (gastroesophageal reflux disease)   . Heart murmur   . Heart murmur   . History of kidney stones   . HTN (hypertension) 01/30/2014  . Hypercholesteremia   . Hypertension   . Prostate cancer (HCC)[place] metastatic  . Weakness of left leg  SOCIAL HX:  Social History   Tobacco Use  . Smoking status: Former Smoker    Packs/day: 1.50    Years: 25.00    Pack years: 37.50    Types: Cigarettes    Last attempt to quit: 01/21/1974    Years since quitting: 44.8  . Smokeless tobacco: Never Used  Substance Use Topics  . Alcohol use: No    ALLERGIES: No Known Allergies   PERTINENT MEDICATIONS:  Outpatient Encounter Medications as of 10/29/2018  Medication Sig  . allopurinol  (ZYLOPRIM) 300 MG tablet Take 300 mg by mouth daily.   Marland Kitchen aspirin 81 MG tablet Take 81 mg by mouth daily.   Marland Kitchen atorvastatin (LIPITOR) 10 MG tablet Take 10 mg by mouth daily with supper.   . carvedilol (COREG) 6.25 MG tablet Take 1 tablet (6.25 mg total) by mouth 2 (two) times daily with a meal.  . chlorthalidone (HYGROTON) 25 MG tablet Take 25 mg by mouth daily.   . enzalutamide (XTANDI) 40 MG capsule Take 4 capsules (160 mg total) by mouth daily.  . fentaNYL (DURAGESIC) 50 MCG/HR Place 1 patch onto the skin every 3 (three) days.  . ferrous sulfate 325 (65 FE) MG tablet Take 325 mg by mouth daily with breakfast.   . hydrochlorothiazide (MICROZIDE) 12.5 MG capsule Take 12.5 mg by mouth daily.  . metFORMIN (GLUCOPHAGE) 500 MG tablet Take 500 mg by mouth daily.   . mirabegron ER (MYRBETRIQ) 25 MG TB24 tablet Take 1 tablet (25 mg total) by mouth daily.  Marland Kitchen oxybutynin (DITROPAN-XL) 5 MG 24 hr tablet Take 1 tablet (5 mg total) by mouth 3 (three) times daily.  Marland Kitchen oxyCODONE (OXY IR/ROXICODONE) 5 MG immediate release tablet Take 1-2 tablets (5-10 mg total) by mouth every 4 (four) hours as needed for moderate pain, severe pain or breakthrough pain.  . pantoprazole (PROTONIX) 40 MG tablet Take 1 tablet (40 mg total) by mouth daily before breakfast.  . ranitidine (ZANTAC) 150 MG tablet Take 150 mg by mouth at bedtime.   . vitamin B-12 (CYANOCOBALAMIN) 1000 MCG tablet Take 1,000 mcg by mouth daily.  . vitamin C (ASCORBIC ACID) 500 MG tablet Take 500 mg by mouth daily.  . Vitamin D, Ergocalciferol, (DRISDOL) 1.25 MG (50000 UT) CAPS capsule Take 5,000 Units by mouth once a week.   . diazepam (VALIUM) 5 MG tablet One pill 45-60 mins prior to procedure; 1 pill 15 mins prior if needed. (Patient not taking: Reported on 10/23/2018)  . naloxone (NARCAN) nasal spray 4 mg/0.1 mL For use with opioid overdose. 1 spray delivered by intranasal administration. Call 911 (EMS) if used.   No facility-administered encounter  medications on file as of 10/29/2018.     PHYSICAL EXAM:   General: NAD, frail appearing Cardiovascular: regular rate and rhythm Pulmonary: clear ant fields Abdomen: soft, nontender, + bowel sounds GU: no suprapubic tenderness Extremities: no edema, no joint deformities Skin: no rashes Neurological: Weakness but otherwise nonfocal  Ezekiel Slocumb, NP

## 2018-11-03 ENCOUNTER — Telehealth: Payer: Self-pay | Admitting: Internal Medicine

## 2018-11-03 ENCOUNTER — Telehealth: Payer: Self-pay | Admitting: *Deleted

## 2018-11-03 ENCOUNTER — Other Ambulatory Visit: Payer: Self-pay | Admitting: *Deleted

## 2018-11-03 MED ORDER — OXYCODONE HCL 5 MG PO TABS: 5.0000 mg | ORAL_TABLET | ORAL | 0 refills | Status: DC | PRN

## 2018-11-03 MED ORDER — DEXAMETHASONE 2 MG PO TABS: 2.0000 mg | ORAL_TABLET | Freq: Every day | ORAL | 0 refills | Status: DC

## 2018-11-03 NOTE — Telephone Encounter (Signed)
Dr. B patient is requesting oxycodone and dexamethasone. I do not see where you have asked him to continue the steroids.   Also he wants to know you have spoken to orthopedics doctors regarding a steroid injection in his hips.

## 2018-11-03 NOTE — Telephone Encounter (Signed)
Spoke to patient. See RN note regarding steroids.

## 2018-11-03 NOTE — Telephone Encounter (Signed)
Spoke with patient. Informed patient that narcotic script was sent to his pharmacy. I also explained that Dr. B has left a vm for the patient's orthopedic provider. I encouraged the pt to also contact the orthopedic office regarding setting up the apts for hip injections. Pt gave verbal understanding.

## 2018-11-03 NOTE — Telephone Encounter (Signed)
Spoke to pt's ortho- Cameron Proud; recommend follow up from ortho stand point re: right hip pain.

## 2018-11-13 ENCOUNTER — Other Ambulatory Visit: Payer: Self-pay | Admitting: *Deleted

## 2018-11-13 MED ORDER — DEXAMETHASONE 2 MG PO TABS: 2.0000 mg | ORAL_TABLET | Freq: Every day | ORAL | 0 refills | Status: DC

## 2018-11-13 MED ORDER — OXYCODONE HCL 5 MG PO TABS: 5.0000 mg | ORAL_TABLET | ORAL | 0 refills | Status: DC | PRN

## 2018-11-13 NOTE — Telephone Encounter (Signed)
Requests refill of Oxycodone he has used #45 tabs in 10 days

## 2018-11-15 ENCOUNTER — Other Ambulatory Visit: Payer: Self-pay | Admitting: Internal Medicine

## 2018-11-15 DIAGNOSIS — C7951 Secondary malignant neoplasm of bone: Principal | ICD-10-CM

## 2018-11-15 DIAGNOSIS — C61 Malignant neoplasm of prostate: Secondary | ICD-10-CM

## 2018-11-17 NOTE — Telephone Encounter (Signed)
CBC with Differential  Order: [id number]  Status:  Final result  Visible to patient:  No (Not Released)  Next appt:  12/01/2018 at 10:00 AM in Oncology ([facility])  Dx:  Prostate cancer metastatic to bone ([place])   Ref Range & Units 3wk ago 46mo ago 56mo ago 6mo ago 65mo ago 87mo ago 60mo ago  WBC 4.0 - 10.5 K/uL 5.7  6.1  6.5  6.5  8.3  7.7  7.0 R  RBC 4.22 - 5.81 MIL/uL 4.42  4.32  3.87Low   3.94Low   3.89Low   4.12Low   4.08Low  R  Hemoglobin 13.0 - 17.0 g/dL 13.0  12.5Low   11.3Low   11.4Low   11.2Low   11.7Low   11.9Low  R  HCT 39.0 - 52.0 % 40.3  39.5  36.3Low   35.7Low   35.2Low   36.5Low   35.6Low  R  MCV 80.0 - 100.0 fL 91.2  91.4  93.8  90.6  90.5  88.6  87.3   MCH 26.0 - 34.0 pg 29.4  28.9  29.2  28.9  28.8  28.4  29.2   MCHC 30.0 - 36.0 g/dL 32.3  31.6  31.1  31.9  31.8  32.1  33.4 R  RDW 11.5 - 15.5 % 14.9  15.1  15.6High   14.9  15.2  15.4  16.1High  R  Platelets 150 - 400 K/uL 162  169  170  171  169  161  178 R  nRBC 0.0 - 0.2 % 0.0  0.0  0.0  0.0  0.0  0.0    Neutrophils Relative % % 61  69  67  66  73  68  69   Neutro Abs 1.7 - 7.7 K/uL 3.5  4.1  4.3  4.3  6.1  5.2  4.8 R  Lymphocytes Relative % 20  15  16  14  11  13  13    Lymphs Abs 0.7 - 4.0 K/uL 1.1  0.9  1.0  0.9  0.9  1.0  0.9Low  R  Monocytes Relative % 10  10  10  11  9  9  10    Monocytes Absolute 0.1 - 1.0 K/uL 0.6  0.6  0.6  0.7  0.7  0.7  0.7 R  Eosinophils Relative % 6  5  6  7  6  8  7    Eosinophils Absolute 0.0 - 0.5 K/uL 0.3  0.3  0.4  0.4  0.5  0.6High   0.5 R  Basophils Relative % 2  1  1  1  1  1  1    Basophils Absolute 0.0 - 0.1 K/uL 0.1  0.1  0.1  0.1  0.1  0.1  0.1 R, CM  Immature Granulocytes % 1  0  0  1  0  1    Abs Immature Granulocytes 0.00 - 0.07 K/uL 0.04  0.02 CM 0.02 CM 0.03 CM 0.03 CM 0.04 CM   Comment: Performed at [facility], [address] [place], [place] [address]  Resulting Agency  [facility] CLIN LAB [place] [facility] [place] [facility] [place] [facility] [place] [facility] [place] [facility] LAB       Specimen Collected: 10/23/18 09:12  Last Resulted: 10/23/18 09:28     Lab Flowsheet    Order Details    View Encounter    Lab and Collection Details    Routing    Result History  CM=Additional commentsR=Reference range differs from displayed range      Other Results from 10/23/2018   Contains abnormal data PSA  Order: [id number]   Status:  Final result  Visible to patient:  No (Not Released)  Next appt:  12/01/2018 at 10:00 AM in Oncology ([facility])  Dx:  Prostate cancer metastatic to bone ([place])   Ref Range & Units 3wk ago 39mo ago 81mo ago 57mo ago 26mo ago 31mo ago 101mo ago  Prostatic Specific Antigen 0.00 - 4.00 ng/mL 4.42High   4.03High  CM 3.15 CM 1.93 CM 3.87 CM 3.97 CM 3.26 CM  Comment: (NOTE)  While PSA levels of <=4.0 ng/ml are reported as reference range, some  men with levels below 4.0 ng/ml can have prostate cancer and many men  with PSA above 4.0 ng/ml do not have prostate cancer. Other tests  such as free PSA, age specific reference ranges, PSA velocity and PSA  doubling time may be helpful especially in men less than 74 years  old.  Performed at [facility], [place] [address], [place], [place]  [address]   Resulting Agency  [facility] [place] [facility] [place] [facility] [place] [facility] [place] [facility] [place] [facility] [place] [facility]      Specimen Collected: 10/23/18 09:12  Last Resulted: 10/23/18 14:25     Lab Flowsheet    Order Details    View Encounter    Lab and Collection Details    Routing    Result History      CM=Additional comments        Contains abnormal data Comprehensive metabolic panel  Order: [id number]   Status:  Final result  Visible to patient:  No (Not Released)  Next appt:  12/01/2018 at 10:00 AM in Oncology ([facility])  Dx:  Prostate cancer metastatic to bone ([place])   Ref Range & Units 3wk ago 97mo ago 49mo ago 9mo ago 66mo ago 38mo ago 33mo ago  Sodium 135 - 145 mmol/L 137  138  142  139  139  138  137   Potassium 3.5 - 5.1  mmol/L 3.7  4.0  4.4  4.0  3.9  4.1  4.0   Chloride 98 - 111 mmol/L 102  106  109  106  104  103  101   CO2 22 - 32 mmol/L 25  24  25  26  26  26  28    Glucose, Bld 70 - 99 mg/dL 148High   175High   136High   134High   154High   139High   128High    BUN 8 - 23 mg/dL 34High   33High   31High   29High   26High   26High   24High    Creatinine, Ser 0.61 - 1.24 mg/dL 1.24  1.13  1.19  1.28High   1.40High   1.21  1.25High    Calcium 8.9 - 10.3 mg/dL 10.4High   10.1  10.3  10.2  10.2  10.5High   10.7High    Total Protein 6.5 - 8.1 g/dL 7.5  7.5  7.3  7.0  6.9  6.5  7.3   Albumin 3.5 - 5.0 g/dL 3.8  3.9  3.8  3.7  3.8  3.8  3.8   AST 15 - 41 U/L 19  20  20  21  22  21  24    ALT 0 - 44 U/L 12  11  11  11   14  13  13   Alkaline Phosphatase 38 - 126 U/L 68  76  65  59  62  62  59   Total Bilirubin 0.3 - 1.2 mg/dL 0.5  0.4  0.4  0.4  0.3  0.5  0.4   GFR calc non Af Amer >60 mL/min 56Low   >60  59Low   54Low   47Low   56Low   54Low    GFR calc Af Amer >60 mL/min >60  >60  >60  >60  55Low  CM >60 CM >60 CM  Anion gap 5 - 15 10  8  CM 8 CM 7 CM 9 CM 9 CM 8 CM  Comment: Performed at [facility], [address] [place], [place] [address]  Resulting Agency  [facility] CLIN LAB [place] CLIN LAB [place] CLIN LAB [place] CL[facility] [place] [facility] [place] [facility] [place] [facility]      Specimen Collected: 10/23/18 09:12  Last Resulted: 10/23/18 09:37

## 2018-11-24 ENCOUNTER — Other Ambulatory Visit: Payer: Self-pay | Admitting: *Deleted

## 2018-11-24 MED ORDER — FENTANYL 50 MCG/HR TD PT72: 1.0000 | MEDICATED_PATCH | TRANSDERMAL | 0 refills | Status: DC

## 2018-11-24 MED ORDER — OXYCODONE HCL 5 MG PO TABS: 5.0000 mg | ORAL_TABLET | ORAL | 0 refills | Status: DC | PRN

## 2018-11-27 MED FILL — XTANDI 40 MG CAPSULE: 40 | 30 days supply | Qty: 120 | Fill #0

## 2018-11-30 ENCOUNTER — Other Ambulatory Visit: Payer: Self-pay

## 2018-12-01 ENCOUNTER — Inpatient Hospital Stay: Payer: Medicare HMO

## 2018-12-01 ENCOUNTER — Inpatient Hospital Stay (HOSPITAL_BASED_OUTPATIENT_CLINIC_OR_DEPARTMENT_OTHER): Payer: Medicare HMO | Admitting: Hospice and Palliative Medicine

## 2018-12-01 ENCOUNTER — Inpatient Hospital Stay (HOSPITAL_BASED_OUTPATIENT_CLINIC_OR_DEPARTMENT_OTHER): Payer: Medicare HMO | Admitting: Internal Medicine

## 2018-12-01 ENCOUNTER — Encounter: Payer: Self-pay | Admitting: Internal Medicine

## 2018-12-01 ENCOUNTER — Other Ambulatory Visit: Payer: Self-pay

## 2018-12-01 ENCOUNTER — Inpatient Hospital Stay: Payer: Medicare HMO | Attending: Internal Medicine

## 2018-12-01 VITALS — BP 130/81 | HR 74 | Temp 97.9°F | Resp 20 | Ht 69.0 in | Wt 179.0 lb

## 2018-12-01 DIAGNOSIS — Z191 Hormone sensitive malignancy status: Secondary | ICD-10-CM

## 2018-12-01 DIAGNOSIS — C7951 Secondary malignant neoplasm of bone: Secondary | ICD-10-CM

## 2018-12-01 DIAGNOSIS — E1122 Type 2 diabetes mellitus with diabetic chronic kidney disease: Secondary | ICD-10-CM

## 2018-12-01 DIAGNOSIS — I10 Essential (primary) hypertension: Secondary | ICD-10-CM | POA: Diagnosis not present

## 2018-12-01 DIAGNOSIS — Z79899 Other long term (current) drug therapy: Secondary | ICD-10-CM

## 2018-12-01 DIAGNOSIS — R531 Weakness: Secondary | ICD-10-CM | POA: Insufficient documentation

## 2018-12-01 DIAGNOSIS — Z7984 Long term (current) use of oral hypoglycemic drugs: Secondary | ICD-10-CM

## 2018-12-01 DIAGNOSIS — Z79818 Long term (current) use of other agents affecting estrogen receptors and estrogen levels: Secondary | ICD-10-CM | POA: Insufficient documentation

## 2018-12-01 DIAGNOSIS — C61 Malignant neoplasm of prostate: Secondary | ICD-10-CM

## 2018-12-01 DIAGNOSIS — Z87442 Personal history of urinary calculi: Secondary | ICD-10-CM

## 2018-12-01 DIAGNOSIS — Z515 Encounter for palliative care: Secondary | ICD-10-CM

## 2018-12-01 DIAGNOSIS — M25551 Pain in right hip: Secondary | ICD-10-CM | POA: Insufficient documentation

## 2018-12-01 DIAGNOSIS — I4892 Unspecified atrial flutter: Secondary | ICD-10-CM | POA: Diagnosis not present

## 2018-12-01 DIAGNOSIS — K219 Gastro-esophageal reflux disease without esophagitis: Secondary | ICD-10-CM

## 2018-12-01 DIAGNOSIS — I350 Nonrheumatic aortic (valve) stenosis: Secondary | ICD-10-CM | POA: Insufficient documentation

## 2018-12-01 DIAGNOSIS — Z7982 Long term (current) use of aspirin: Secondary | ICD-10-CM

## 2018-12-01 DIAGNOSIS — M1990 Unspecified osteoarthritis, unspecified site: Secondary | ICD-10-CM

## 2018-12-01 DIAGNOSIS — Z87891 Personal history of nicotine dependence: Secondary | ICD-10-CM

## 2018-12-01 DIAGNOSIS — R5383 Other fatigue: Secondary | ICD-10-CM | POA: Insufficient documentation

## 2018-12-01 DIAGNOSIS — E7800 Pure hypercholesterolemia, unspecified: Secondary | ICD-10-CM

## 2018-12-01 DIAGNOSIS — G8929 Other chronic pain: Secondary | ICD-10-CM

## 2018-12-01 LAB — CBC
HCT: 39.5 % (ref 39.0–52.0)
Hemoglobin: 13.1 g/dL (ref 13.0–17.0)
MCH: 30.5 pg (ref 26.0–34.0)
MCHC: 33.2 g/dL (ref 30.0–36.0)
MCV: 91.9 fL (ref 80.0–100.0)
Platelets: 165 10*3/uL (ref 150–400)
RBC: 4.3 MIL/uL (ref 4.22–5.81)
RDW: 15.1 % (ref 11.5–15.5)
WBC: 6.8 10*3/uL (ref 4.0–10.5)
nRBC: 0 % (ref 0.0–0.2)

## 2018-12-01 LAB — COMPREHENSIVE METABOLIC PANEL
ALT: 11 U/L (ref 0–44)
AST: 19 U/L (ref 15–41)
Albumin: 3.7 g/dL (ref 3.5–5.0)
Alkaline Phosphatase: 62 U/L (ref 38–126)
Anion gap: 9 (ref 5–15)
BUN: 29 mg/dL — ABNORMAL HIGH (ref 8–23)
CO2: 28 mmol/L (ref 22–32)
Calcium: 10.5 mg/dL — ABNORMAL HIGH (ref 8.9–10.3)
Chloride: 99 mmol/L (ref 98–111)
Creatinine, Ser: 1.39 mg/dL — ABNORMAL HIGH (ref 0.61–1.24)
GFR calc Af Amer: 56 mL/min — ABNORMAL LOW (ref >60)
GFR calc non Af Amer: 49 mL/min — ABNORMAL LOW (ref >60)
Glucose, Bld: 140 mg/dL — ABNORMAL HIGH (ref 70–99)
Potassium: 4 mmol/L (ref 3.5–5.1)
Sodium: 136 mmol/L (ref 135–145)
Total Bilirubin: 0.6 mg/dL (ref 0.3–1.2)
Total Protein: 7.5 g/dL (ref 6.5–8.1)

## 2018-12-01 LAB — PSA: Prostatic Specific Antigen: 6.3 ng/mL — ABNORMAL HIGH (ref 0.00–4.00)

## 2018-12-01 MED ORDER — OXYCODONE HCL 10 MG PO TABS: 10.0000 mg | ORAL_TABLET | Freq: Four times a day (QID) | ORAL | 0 refills | Status: DC | PRN

## 2018-12-01 MED ORDER — LEUPROLIDE ACETATE (4 MONTH) 30 MG IM KIT
30.0000 mg | PACK | Freq: Once | INTRAMUSCULAR | Status: AC
  Administered 2018-12-01: 12:00:00 30 mg via INTRAMUSCULAR
  Filled 2018-12-01: qty 30

## 2018-12-01 MED ORDER — DENOSUMAB 120 MG/1.7ML ~~LOC~~ SOLN
120.0000 mg | Freq: Once | SUBCUTANEOUS | Status: AC
  Administered 2018-12-01: 120 mg via SUBCUTANEOUS
  Filled 2018-12-01: qty 1.7

## 2018-12-01 NOTE — Progress Notes (Signed)
[facility] OFFICE PROGRESS NOTE  Patient Care Team: Tamsen Roers, MD as PCP - General (Family Medicine) Abbie Sons, MD as Consulting Physician (Urology)  Cancer Staging No matching staging information was found for the patient.   Oncology History   # AUG 2016- METASTATIC PROSTATE CA/ STAGE IV; Castrate sensitive [PSA- 48] ; mets- lumbar spine [s/p pal RT to Aug 2016; Dr.Crystal] /Pelvic LN; 1994- Prostate CA s/p Surgery [facility]]; Lupron q4 M; MARCH 2017- Bone scan- L4/Right pubic rami uptake; PSA- 0.1/testosterone-castrate [<3]  # Bone lesions on denosumab;DEC 2016- Recm q 87M  # NOV 2018- CASTRATE RESISTANT; declined chemo; 08/06/2017- start Zytiga+prednisone; Stopped April 24th [rising PSA];  # may 2019- Xofigo #6 on 10/16]  # OCT 16th 2019- X-tandi  # Left hydronephrosis s/p stenting [Dr.Budzyn] --------------------------------------------   DIAGNOSIS: '[ ]'$  castrate resistant prostate cancer  STAGE:  IV       ;GOALS: palliative  CURRENT/MOST RECENT THERAPY ; Xtandi     Prostate cancer metastatic to bone [facility])      INTERVAL HISTORY:  [name] 78 y.o.  male pleasant patient above history of castrate resistant prostate cancer is here for follow-up  In the interim patient was evaluated by orthopedic [place] clinic had injection to his right hip.  Pain is not improved.  He continues to be narcotic pain medication.    Otherwise appetite is fair.  No headaches.  No nausea no vomiting.   Review of Systems  Constitutional: Positive for malaise/fatigue. Negative for chills, diaphoresis, fever and weight loss.  HENT: Negative for nosebleeds and sore throat.   Eyes: Negative for double vision.  Respiratory: Negative for cough, hemoptysis, sputum production, shortness of breath and wheezing.   Cardiovascular: Negative for chest pain, palpitations and orthopnea.  Gastrointestinal: Negative for abdominal pain, blood in stool, constipation, diarrhea,  heartburn, melena, nausea and vomiting.  Genitourinary: Negative for dysuria, frequency and urgency.  Musculoskeletal: Positive for back pain and joint pain.  Skin: Negative.  Negative for itching and rash.  Neurological: Negative for dizziness, tingling, focal weakness, weakness and headaches.  Endo/Heme/Allergies: Does not bruise/bleed easily.  Psychiatric/Behavioral: Negative for depression. The patient is not nervous/anxious and does not have insomnia.       PAST MEDICAL HISTORY :  Past Medical History:  Diagnosis Date  . Aortic stenosis, moderate 01/30/2014  . Arthritis   . Atrial flutter ([place]) 02/02/2014  . Diabetes mellitus without complication (HCC)    diet control  . GERD (gastroesophageal reflux disease)   . Heart murmur   . Heart murmur   . History of kidney stones   . HTN (hypertension) 01/30/2014  . Hypercholesteremia   . Hypertension   . Prostate cancer ([place])    metastatic  . Weakness of left leg     PAST SURGICAL HISTORY :   Past Surgical History:  Procedure Laterality Date  . 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 CATARACT EXTRACTION W/ INTRAOCULAR LENS IMPLANT Bilateral   . COLON RESECTION    . CYSTOSCOPY W/ RETROGRADES Left 06/08/2015   Procedure: CYSTOSCOPY WITH RETROGRADE PYELOGRAM;  Surgeon: Nickie Retort, MD;  Location: [facility];  Service: Urology;  Laterality: Left;  . CYSTOSCOPY W/ RETROGRADES Left 05/13/2018   Procedure: CYSTOSCOPY WITH RETROGRADE PYELOGRAM;  Surgeon: Abbie Sons, MD;  Location: [facility];  Service: Urology;  Laterality: Left;  .  CYSTOSCOPY W/ URETERAL STENT PLACEMENT Left 10/05/2015   Procedure: CYSTOSCOPY WITH STENT REPLACEMENT;  Surgeon: Nickie Retort, MD;  Location: [facility];  Service: Urology;  Laterality: Left;  . CYSTOSCOPY W/ URETERAL STENT PLACEMENT Left 01/04/2016   Procedure: CYSTOSCOPY WITH STENT  REPLACEMENT;  Surgeon: Nickie Retort, MD;  Location: [facility];  Service: Urology;  Laterality: Left;  . CYSTOSCOPY W/ URETERAL STENT PLACEMENT Left 04/11/2016   Procedure: CYSTOSCOPY WITH STENT REPLACEMENT;  Surgeon: Nickie Retort, MD;  Location: [facility];  Service: Urology;  Laterality: Left;  . CYSTOSCOPY W/ URETERAL STENT PLACEMENT Left 07/18/2016   Procedure: CYSTOSCOPY WITH STENT REPLACEMENT;  Surgeon: Nickie Retort, MD;  Location: [facility];  Service: Urology;  Laterality: Left;  . CYSTOSCOPY W/ URETERAL STENT PLACEMENT Left 11/02/2016   Procedure: CYSTOSCOPY WITH STENT REPLACEMENT;  Surgeon: Nickie Retort, MD;  Location: [facility];  Service: Urology;  Laterality: Left;  . CYSTOSCOPY W/ URETERAL STENT PLACEMENT Left 02/08/2017   Procedure: CYSTOSCOPY WITH STENT REPLACEMENT;  Surgeon: Nickie Retort, MD;  Location: [facility];  Service: Urology;  Laterality: Left;  . CYSTOSCOPY W/ URETERAL STENT PLACEMENT Left 05/10/2017   Procedure: CYSTOSCOPY WITH STENT REPLACEMENT;  Surgeon: Nickie Retort, MD;  Location: [facility];  Service: Urology;  Laterality: Left;  . CYSTOSCOPY W/ URETERAL STENT PLACEMENT Left 05/13/2018   Procedure: CYSTOSCOPY WITH STENT Exchange;  Surgeon: Abbie Sons, MD;  Location: [facility];  Service: Urology;  Laterality: Left;  . CYSTOSCOPY WITH STENT PLACEMENT Left 08/16/2017   Procedure: CYSTOSCOPY WITH STENT EXCHANGE;  Surgeon: Abbie Sons, MD;  Location: [facility];  Service: Urology;  Laterality: Left;  . CYSTOSCOPY WITH STENT PLACEMENT Left 12/17/2017   Procedure: CYSTOSCOPY WITH STENT EXCHANGE;  Surgeon: Abbie Sons, MD;  Location: [facility];  Service: Urology;  Laterality: Left;  . EYE SURGERY Bilateral Sept and Oct.2012   cataract  . HERNIA REPAIR    . PROSTATECTOMY  1994    FAMILY HISTORY :   Family History  Problem Relation Age of Onset  . Aneurysm Father   . Cancer Mother        breast    SOCIAL HISTORY:   Social History    Tobacco Use  . Smoking status: Former Smoker    Packs/day: 1.50    Years: 25.00    Pack years: 37.50    Types: Cigarettes    Last attempt to quit: 01/21/1974    Years since quitting: 44.8  . Smokeless tobacco: Never Used  Substance Use Topics  . Alcohol use: No  . Drug use: No    ALLERGIES:  has No Known Allergies.  MEDICATIONS:  Current Outpatient Medications  Medication Sig Dispense Refill  . allopurinol (ZYLOPRIM) 300 MG tablet Take 300 mg by mouth daily.     Marland Kitchen aspirin 81 MG tablet Take 81 mg by mouth daily.     Marland Kitchen atorvastatin (LIPITOR) 10 MG tablet Take 10 mg by mouth daily with supper.     . carvedilol (COREG) 6.25 MG tablet Take 1 tablet (6.25 mg total) by mouth 2 (two) times daily with a meal. 60 tablet 2  . chlorthalidone (HYGROTON) 25 MG tablet Take 25 mg by mouth daily.     . fentaNYL (DURAGESIC) 50 MCG/HR Place 1 patch onto the skin every 3 (three) days. 10 patch 0  . ferrous sulfate 325 (65 FE) MG tablet Take 325 mg by mouth daily with breakfast.     . hydrochlorothiazide (  MICROZIDE) 12.5 MG capsule Take 12.5 mg by mouth daily.    . metFORMIN (GLUCOPHAGE) 500 MG tablet Take 500 mg by mouth daily.     . mirabegron ER (MYRBETRIQ) 25 MG TB24 tablet Take 1 tablet (25 mg total) by mouth daily. 30 tablet 11  . naloxone (NARCAN) nasal spray 4 mg/0.1 mL For use with opioid overdose. 1 spray delivered by intranasal administration. Call 911 (EMS) if used. 1 kit 0  . oxybutynin (DITROPAN-XL) 5 MG 24 hr tablet Take 1 tablet (5 mg total) by mouth 3 (three) times daily. 90 tablet 11  . oxyCODONE (OXY IR/ROXICODONE) 5 MG immediate release tablet Take 1-2 tablets (5-10 mg total) by mouth every 4 (four) hours as needed for moderate pain, severe pain or breakthrough pain. 45 tablet 0  . pantoprazole (PROTONIX) 40 MG tablet Take 1 tablet (40 mg total) by mouth daily before breakfast. 30 tablet 2  . ranitidine (ZANTAC) 150 MG tablet Take 150 mg by mouth at bedtime.     . vitamin B-12  (CYANOCOBALAMIN) 1000 MCG tablet Take 1,000 mcg by mouth daily.    . vitamin C (ASCORBIC ACID) 500 MG tablet Take 500 mg by mouth daily.    . Vitamin D, Ergocalciferol, (DRISDOL) 1.25 MG (50000 UT) CAPS capsule Take 5,000 Units by mouth once a week.     Gillermina Phy 40 MG capsule TAKE 4 CAPSULES (160 MG TOTAL) BY MOUTH DAILY. 120 capsule 4  . dexamethasone (DECADRON) 2 MG tablet Take 1 tablet (2 mg total) by mouth daily. 7 tablet 0  . diazepam (VALIUM) 5 MG tablet One pill 45-60 mins prior to procedure; 1 pill 15 mins prior if needed. (Patient not taking: Reported on 10/23/2018) 2 tablet 0   No current facility-administered medications for this visit.     PHYSICAL EXAMINATION: ECOG PERFORMANCE STATUS: 1 - Symptomatic but completely ambulatory  BP 130/81 (BP Location: Right Arm, Patient Position: Sitting)   Pulse 74   Temp 97.9 F (36.6 C) (Tympanic)   Resp 20   Ht _0  (1.753 m)   Wt 179 lb (81.2 kg)   BMI 26.43 kg/m   Filed Weights   12/01/18 1029  Weight: 179 lb (81.2 kg)    Physical Exam  Constitutional: He is oriented to person, place, and time and well-developed, well-nourished, and in no distress.  In wheel chair.  Alone.  HENT:  Head: Normocephalic and atraumatic.  Mouth/Throat: Oropharynx is clear and moist. No oropharyngeal exudate.  Eyes: Pupils are equal, round, and reactive to light.  Neck: Normal range of motion. Neck supple.  Cardiovascular: Normal rate and regular rhythm.  Pulmonary/Chest: No respiratory distress. He has no wheezes.  Abdominal: Soft. Bowel sounds are normal. He exhibits no distension and no mass. There is no abdominal tenderness. There is no rebound and no guarding.  Musculoskeletal:        General: No tenderness or edema.  Neurological: He is alert and oriented to person, place, and time.  Skin: Skin is warm.  Psychiatric: Affect normal.       LABORATORY DATA:  I have reviewed the data as listed    Component Value Date/Time   NA 136  12/01/2018 1004   K 4.0 12/01/2018 1004   CL 99 12/01/2018 1004   CO2 28 12/01/2018 1004   GLUCOSE 140 (H) 12/01/2018 1004   BUN 29 (H) 12/01/2018 1004   CREATININE 1.39 (H) 12/01/2018 1004   CALCIUM 10.5 (H) 12/01/2018 1004   PROT 7.5  12/01/2018 1004   ALBUMIN 3.7 12/01/2018 1004   AST 19 12/01/2018 1004   ALT 11 12/01/2018 1004   ALKPHOS 62 12/01/2018 1004   BILITOT 0.6 12/01/2018 1004   GFRNONAA 49 (L) 12/01/2018 1004   GFRAA 56 (L) 12/01/2018 1004    No results found for: SPEP, UPEP  Lab Results  Component Value Date   WBC 6.8 12/01/2018   NEUTROABS 3.5 10/23/2018   HGB 13.1 12/01/2018   HCT 39.5 12/01/2018   MCV 91.9 12/01/2018   PLT 165 12/01/2018      Chemistry      Component Value Date/Time   NA 136 12/01/2018 1004   K 4.0 12/01/2018 1004   CL 99 12/01/2018 1004   CO2 28 12/01/2018 1004   BUN 29 (H) 12/01/2018 1004   CREATININE 1.39 (H) 12/01/2018 1004      Component Value Date/Time   CALCIUM 10.5 (H) 12/01/2018 1004   ALKPHOS 62 12/01/2018 1004   AST 19 12/01/2018 1004   ALT 11 12/01/2018 1004   BILITOT 0.6 12/01/2018 1004       RADIOGRAPHIC STUDIES: I have personally reviewed the radiological images as listed and agreed with the findings in the report. No results found.   ASSESSMENT & PLAN:  Prostate cancer metastatic to bone [facility]) # Metastatic castrate resistant prostate cancer-Lupron q 4 M [07/30/2018] and Xgeva; February 2020 aux PET scan-no evidence of progressive disease in the pelvis; improving T9 lesion; right posterior rib lesion.  PSA slowly rising February 2020 PSA 4.6.  Clinically stable.  #Proceed with Delton See Lupron today.  # Right hip pain- status post radiation; worsening/PET scan negative for any active disease.  Likely secondary to benign causes.  Defer to orthopedics.  Increase the dose of oxycodone 5 to 10 mg every 4 hours.  Also continue fentanyl patch.  Discussed with [employer].  # Bone mets-on Xgeva monthly.  Calcium  normal.  Stable  # CKD- stage III; creatinine 1.28 stable  # DISPOSITION:  #  Jayme Cloud /lupron today;  # Follow up  On 5/11weeks/labs-cbc/cmp/psa/X-geva/-Dr.B   No orders of the defined types were placed in this encounter.  All questions were answered. The patient knows to call the clinic with any problems, questions or concerns.      Cammie Sickle, MD 12/01/2018 11:12 AM

## 2018-12-01 NOTE — Assessment & Plan Note (Addendum)
#   Metastatic castrate resistant prostate cancer-Lupron q 4 M [07/30/2018] and Xgeva; February 2020 aux PET scan-no evidence of progressive disease in the pelvis; improving T9 lesion; right posterior rib lesion.  PSA slowly rising February 2020 PSA 4.6.  Clinically stable.  #Proceed with Delton See Lupron today.  # Right hip pain- status post radiation; worsening/PET scan negative for any active disease.  Likely secondary to benign causes.  Defer to orthopedics.  Increase the dose of oxycodone 5 to 10 mg every 4 hours.  Also continue fentanyl patch.  Discussed with [employer].  # Bone mets-on Xgeva monthly.  Calcium normal.  Stable  # CKD- stage III; creatinine 1.28 stable  # DISPOSITION:  #  Jayme Cloud /lupron today;  # Follow up  On 5/11weeks/labs-cbc/cmp/psa/X-geva/-Dr.B

## 2018-12-01 NOTE — Progress Notes (Signed)
[place]  Telephone:([phone] [phone]   Name: [name] Date: 12/01/2018 MRN: [id number]  DOB: 10/01/1940  Patient Care Team: Tamsen Roers, MD as PCP - General (Family Medicine) Bernardo Heater, Ronda Fairly, MD as Consulting Physician (Urology)    REASON FOR CONSULTATION: Palliative Care consult requested for this 78 y.o. 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  PMH also notable for hydronephrosis status post stenting, moderate aortic stenosis, hypertension, diabetes.  Patient has had severe hip pain.  He was referred to palliative care for help with symptom management and to address goals.  SOCIAL HISTORY:    Patient is married and lives at home with his wife.  He has several adult children.  He is a retired Patent examiner for [employer].  ADVANCE DIRECTIVES:  Not on file  CODE STATUS: DNR  PAST MEDICAL HISTORY: Past Medical History:  Diagnosis Date  . Aortic stenosis, moderate 01/30/2014  . Arthritis   . Atrial flutter ([place]) 02/02/2014  . Diabetes mellitus without complication (HCC)    diet control  . GERD (gastroesophageal reflux disease)   . Heart murmur   . Heart murmur   . History of kidney stones   . HTN (hypertension) 01/30/2014  . Hypercholesteremia   . Hypertension   . Prostate cancer ([place])    metastatic  . Weakness of left leg     PAST SURGICAL HISTORY:  Past Surgical History:  Procedure Laterality Date  . 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 CATARACT EXTRACTION W/ INTRAOCULAR LENS IMPLANT Bilateral   . COLON RESECTION    . CYSTOSCOPY W/ RETROGRADES Left 06/08/2015   Procedure: CYSTOSCOPY WITH RETROGRADE PYELOGRAM;  Surgeon: Nickie Retort, MD;  Location: [facility];   Service: Urology;  Laterality: Left;  . CYSTOSCOPY W/ RETROGRADES Left 05/13/2018   Procedure: CYSTOSCOPY WITH RETROGRADE PYELOGRAM;  Surgeon: Abbie Sons, MD;  Location: [facility];  Service: Urology;  Laterality: Left;  . CYSTOSCOPY W/ URETERAL STENT PLACEMENT Left 10/05/2015   Procedure: CYSTOSCOPY WITH STENT REPLACEMENT;  Surgeon: Nickie Retort, MD;  Location: [facility];  Service: Urology;  Laterality: Left;  . CYSTOSCOPY W/ URETERAL STENT PLACEMENT Left 01/04/2016   Procedure: CYSTOSCOPY WITH STENT REPLACEMENT;  Surgeon: Nickie Retort, MD;  Location: [facility];  Service: Urology;  Laterality: Left;  . CYSTOSCOPY W/ URETERAL STENT PLACEMENT Left 04/11/2016   Procedure: CYSTOSCOPY WITH STENT REPLACEMENT;  Surgeon: Nickie Retort, MD;  Location: [facility];  Service: Urology;  Laterality: Left;  . CYSTOSCOPY W/ URETERAL STENT PLACEMENT Left 07/18/2016   Procedure: CYSTOSCOPY WITH STENT REPLACEMENT;  Surgeon: Nickie Retort, MD;  Location: [facility];  Service: Urology;  Laterality: Left;  . CYSTOSCOPY W/ URETERAL STENT PLACEMENT Left 11/02/2016   Procedure: CYSTOSCOPY WITH STENT REPLACEMENT;  Surgeon: Nickie Retort, MD;  Location: [facility];  Service: Urology;  Laterality: Left;  . CYSTOSCOPY W/ URETERAL STENT PLACEMENT Left 02/08/2017   Procedure: CYSTOSCOPY WITH STENT REPLACEMENT;  Surgeon: Nickie Retort, MD;  Location: [facility];  Service: Urology;  Laterality: Left;  . CYSTOSCOPY W/ URETERAL STENT PLACEMENT Left 05/10/2017   Procedure: CYSTOSCOPY WITH STENT REPLACEMENT;  Surgeon: Nickie Retort, MD;  Location: [facility];  Service: Urology;  Laterality: Left;  .  CYSTOSCOPY W/ URETERAL STENT PLACEMENT Left 05/13/2018   Procedure: CYSTOSCOPY WITH STENT Exchange;  Surgeon: Abbie Sons, MD;  Location: [facility];  Service: Urology;  Laterality: Left;  . CYSTOSCOPY WITH STENT PLACEMENT Left 08/16/2017   Procedure: CYSTOSCOPY WITH STENT EXCHANGE;  Surgeon: Abbie Sons,  MD;  Location: [facility];  Service: Urology;  Laterality: Left;  . CYSTOSCOPY WITH STENT PLACEMENT Left 12/17/2017   Procedure: CYSTOSCOPY WITH STENT EXCHANGE;  Surgeon: Abbie Sons, MD;  Location: [facility];  Service: Urology;  Laterality: Left;  . EYE SURGERY Bilateral Sept and Oct.2012   cataract  . HERNIA REPAIR    . PROSTATECTOMY  1994    HEMATOLOGY/ONCOLOGY HISTORY:  Oncology History   # AUG 2016- METASTATIC PROSTATE CA/ STAGE IV; Castrate sensitive [PSA- 48] ; mets- lumbar spine [s/p pal RT to Aug 2016; Dr.Crystal] /Pelvic LN; 1994- Prostate CA s/p Surgery [facility]]; Lupron q4 M; MARCH 2017- Bone scan- L4/Right pubic rami uptake; PSA- 0.1/testosterone-castrate [<3]  # Bone lesions on denosumab;DEC 2016- Recm q 11M  # NOV 2018- CASTRATE RESISTANT; declined chemo; 08/06/2017- start Zytiga+prednisone; Stopped April 24th [rising PSA];  # may 2019- Xofigo #6 on 10/16]  # OCT 16th 2019- X-tandi  # Left hydronephrosis s/p stenting [Dr.Budzyn] --------------------------------------------   DIAGNOSIS: _0  castrate resistant prostate cancer  STAGE:  IV       ;GOALS: palliative  CURRENT/MOST RECENT THERAPY ; Xtandi     Prostate cancer metastatic to bone [facility])    ALLERGIES:  has No Known Allergies.  MEDICATIONS:  Current Outpatient Medications  Medication Sig Dispense Refill  . allopurinol (ZYLOPRIM) 300 MG tablet Take 300 mg by mouth daily.     Marland Kitchen aspirin 81 MG tablet Take 81 mg by mouth daily.     Marland Kitchen atorvastatin (LIPITOR) 10 MG tablet Take 10 mg by mouth daily with supper.     . carvedilol (COREG) 6.25 MG tablet Take 1 tablet (6.25 mg total) by mouth 2 (two) times daily with a meal. 60 tablet 2  . chlorthalidone (HYGROTON) 25 MG tablet Take 25 mg by mouth daily.     Marland Kitchen dexamethasone (DECADRON) 2 MG tablet Take 1 tablet (2 mg total) by mouth daily. 7 tablet 0  . diazepam (VALIUM) 5 MG tablet One pill 45-60 mins prior to procedure; 1 pill 15 mins prior if needed. (Patient  not taking: Reported on 10/23/2018) 2 tablet 0  . fentaNYL (DURAGESIC) 50 MCG/HR Place 1 patch onto the skin every 3 (three) days. 10 patch 0  . ferrous sulfate 325 (65 FE) MG tablet Take 325 mg by mouth daily with breakfast.     . hydrochlorothiazide (MICROZIDE) 12.5 MG capsule Take 12.5 mg by mouth daily.    . metFORMIN (GLUCOPHAGE) 500 MG tablet Take 500 mg by mouth daily.     . mirabegron ER (MYRBETRIQ) 25 MG TB24 tablet Take 1 tablet (25 mg total) by mouth daily. 30 tablet 11  . naloxone (NARCAN) nasal spray 4 mg/0.1 mL For use with opioid overdose. 1 spray delivered by intranasal administration. Call 911 (EMS) if used. 1 kit 0  . oxybutynin (DITROPAN-XL) 5 MG 24 hr tablet Take 1 tablet (5 mg total) by mouth 3 (three) times daily. 90 tablet 11  . oxyCODONE (OXY IR/ROXICODONE) 5 MG immediate release tablet Take 1-2 tablets (5-10 mg total) by mouth every 4 (four) hours as needed for moderate pain, severe pain or breakthrough pain. 45 tablet 0  . pantoprazole (PROTONIX) 40 MG  tablet Take 1 tablet (40 mg total) by mouth daily before breakfast. 30 tablet 2  . ranitidine (ZANTAC) 150 MG tablet Take 150 mg by mouth at bedtime.     . vitamin B-12 (CYANOCOBALAMIN) 1000 MCG tablet Take 1,000 mcg by mouth daily.    . vitamin C (ASCORBIC ACID) 500 MG tablet Take 500 mg by mouth daily.    . Vitamin D, Ergocalciferol, (DRISDOL) 1.25 MG (50000 UT) CAPS capsule Take 5,000 Units by mouth once a week.     Gillermina Phy 40 MG capsule TAKE 4 CAPSULES (160 MG TOTAL) BY MOUTH DAILY. 120 capsule 4   No current facility-administered medications for this visit.     VITAL SIGNS: There were no vitals taken for this visit. There were no vitals filed for this visit.  Estimated body mass index is 26.43 kg/m as calculated from the following:   Height as of an earlier encounter on 12/01/18: _0  (1.753 m).   Weight as of an earlier encounter on 12/01/18: 179 lb (81.2 kg).  LABS: CBC:    Component Value Date/Time   WBC  6.8 12/01/2018 1004   HGB 13.1 12/01/2018 1004   HCT 39.5 12/01/2018 1004   PLT 165 12/01/2018 1004   MCV 91.9 12/01/2018 1004   NEUTROABS 3.5 10/23/2018 0912   LYMPHSABS 1.1 10/23/2018 0912   MONOABS 0.6 10/23/2018 0912   EOSABS 0.3 10/23/2018 0912   BASOSABS 0.1 10/23/2018 0912   Comprehensive Metabolic Panel:    Component Value Date/Time   NA 136 12/01/2018 1004   K 4.0 12/01/2018 1004   CL 99 12/01/2018 1004   CO2 28 12/01/2018 1004   BUN 29 (H) 12/01/2018 1004   CREATININE 1.39 (H) 12/01/2018 1004   GLUCOSE 140 (H) 12/01/2018 1004   CALCIUM 10.5 (H) 12/01/2018 1004   AST 19 12/01/2018 1004   ALT 11 12/01/2018 1004   ALKPHOS 62 12/01/2018 1004   BILITOT 0.6 12/01/2018 1004   PROT 7.5 12/01/2018 1004   ALBUMIN 3.7 12/01/2018 1004    RADIOGRAPHIC STUDIES: No results found.  PERFORMANCE STATUS (ECOG) : 3 - Symptomatic, >50% confined to bed  Review of Systems As noted above. Otherwise, a complete review of systems is negative.  Physical Exam General: NAD, frail appearing, thin Cardiovascular: regular rate and rhythm Pulmonary: clear ant fields Abdomen: soft, nontender, + bowel sounds GU: no suprapubic tenderness Extremities: no edema, no joint deformities Skin: no rashes Neurological: Weakness but otherwise nonfocal  IMPRESSION: I met with patient and his wife today in the clinic.    Patient continues to have persistent pain in the right hip. This is thought to be primarily related to degenerative changes in the hip. He was seen by ortho and received a steroid injection to the hip but says he had no improvement with that intervention. Pain is severely impacting his quality of life. Pain is rated as 10 out of 10 with any movement. He is currently on fentanyl 18mg Q72H and takes oxycodone 552mseveral times per day but finds no relief with this regimen. Will increase oxycodone to 1078m6H prn.   Discussed plan with Dr. BraTish Menho talked with ortho previously.  Will again call ortho to see if patient would qualify for any other interventional approach.   [facility] following.   PDMP reviewed.   PLAN: 1.  Continue current scope of treatment 2.  Increase oxycodone IR10 mg every 6 hours as needed (#45) 3.  Continue transdermal fentanyl 50 mcg every 72  hours 4.  RTC in 1 month or sooner if needed   Patient expressed understanding and was in agreement with this plan. He also understands that He can call clinic at any time with any questions, concerns, or complaints.     Time Total: 15 minutes  Visit consisted of counseling and education dealing with the complex and emotionally intense issues of symptom management and palliative care in the setting of serious and potentially life-threatening illness.Greater than 50%  of this time was spent counseling and coordinating care related to the above assessment and plan.  Signed by: Altha Harm, PhD, NP-C [phone] (Work Cell)

## 2018-12-02 ENCOUNTER — Telehealth: Payer: Self-pay | Admitting: Hospice and Palliative Medicine

## 2018-12-02 ENCOUNTER — Telehealth: Payer: Self-pay | Admitting: Student

## 2018-12-02 NOTE — Telephone Encounter (Signed)
Palliative NP called patient to f/u. He reports being in pain; states 10/10 to back and pain is radiating down to right hip and leg. He has not taken any medication yet due to not eating. He is instructed to eat so he can take his oxycodone; he verbalizes understanding. He is encouraged to take again in 6 hours to see if this is helping with his pain. He is encouraged to call with questions or notify Palliative NP or [facility] center if pain is not improving.

## 2018-12-02 NOTE — Telephone Encounter (Signed)
I spoke with patient by phone. He is still in pain but has not increased the dose of the oxycodone. He is still only taking one 5mg  tablet. I explained that he can increase to two tablets of the 5mg  dose and that I sent a new prescription for 10mg  tablets, of which he would take one tablet every 6 hours as needed.   I spoke with ortho yesterday, who would recommend MRI of lumbar and hip. However, MRI has been delayed due to COVID-19. They plan to f/u with patient in 1-2 months when hopefully they can get him in for diagnostic workup. In the interim, medication management is recommended.   I spoke with patient about [place] ordering home health PT. He says he does not want anyone to come to the home. I suggested PT in the clinic but he was also not keen on that idea.

## 2018-12-09 ENCOUNTER — Other Ambulatory Visit: Payer: Self-pay | Admitting: *Deleted

## 2018-12-09 NOTE — Telephone Encounter (Signed)
Patient called requesting refill of Dexamethazone.  Per Josh B., patient did not benefit from using this medication and we can discontinue.  Notified pt, he was agreeable to d/c medication.

## 2018-12-22 ENCOUNTER — Telehealth: Payer: Self-pay | Admitting: *Deleted

## 2018-12-22 ENCOUNTER — Other Ambulatory Visit: Payer: Self-pay | Admitting: *Deleted

## 2018-12-22 MED ORDER — OXYCODONE HCL 10 MG PO TABS: 10.0000 mg | ORAL_TABLET | Freq: Four times a day (QID) | ORAL | 0 refills | Status: DC | PRN

## 2018-12-22 NOTE — Telephone Encounter (Signed)
Patient called reporting he has a rash on his feet and would like to speak with Nira Conn regarding it

## 2018-12-22 NOTE — Telephone Encounter (Signed)
I spoke with patient and he wanted to wait until Wednesday or Thursday to come in, so I scheduled him for Thursday

## 2018-12-22 NOTE — Telephone Encounter (Signed)
[name], I also attempted to reach patient and I left a vm as well. [name], I spoke with Josh. We could see patient in [facility] tomorrow if he would like to do so. If he calls back, please offer him an apt in the clinic. Thanks.

## 2018-12-22 NOTE — Telephone Encounter (Signed)
I left message for patient to return phone call.   

## 2018-12-25 ENCOUNTER — Inpatient Hospital Stay: Payer: Medicare HMO | Attending: Nurse Practitioner | Admitting: Nurse Practitioner

## 2018-12-25 ENCOUNTER — Other Ambulatory Visit: Payer: Self-pay | Admitting: Internal Medicine

## 2018-12-25 ENCOUNTER — Inpatient Hospital Stay: Payer: Medicare HMO | Admitting: Nurse Practitioner

## 2018-12-25 ENCOUNTER — Encounter: Payer: Self-pay | Admitting: Nurse Practitioner

## 2018-12-25 ENCOUNTER — Other Ambulatory Visit: Payer: Self-pay

## 2018-12-25 VITALS — BP 146/79 | HR 86 | Temp 95.8°F | Resp 18 | Ht 69.0 in | Wt 178.0 lb

## 2018-12-25 DIAGNOSIS — Z515 Encounter for palliative care: Secondary | ICD-10-CM | POA: Insufficient documentation

## 2018-12-25 DIAGNOSIS — Z87891 Personal history of nicotine dependence: Secondary | ICD-10-CM | POA: Insufficient documentation

## 2018-12-25 DIAGNOSIS — N1330 Unspecified hydronephrosis: Secondary | ICD-10-CM | POA: Diagnosis not present

## 2018-12-25 DIAGNOSIS — Z87442 Personal history of urinary calculi: Secondary | ICD-10-CM | POA: Insufficient documentation

## 2018-12-25 DIAGNOSIS — N183 Chronic kidney disease, stage 3 (moderate): Secondary | ICD-10-CM | POA: Diagnosis not present

## 2018-12-25 DIAGNOSIS — C61 Malignant neoplasm of prostate: Secondary | ICD-10-CM | POA: Insufficient documentation

## 2018-12-25 DIAGNOSIS — Z191 Hormone sensitive malignancy status: Secondary | ICD-10-CM | POA: Insufficient documentation

## 2018-12-25 DIAGNOSIS — R531 Weakness: Secondary | ICD-10-CM | POA: Diagnosis not present

## 2018-12-25 DIAGNOSIS — Z923 Personal history of irradiation: Secondary | ICD-10-CM | POA: Insufficient documentation

## 2018-12-25 DIAGNOSIS — R21 Rash and other nonspecific skin eruption: Secondary | ICD-10-CM | POA: Diagnosis not present

## 2018-12-25 DIAGNOSIS — M25551 Pain in right hip: Secondary | ICD-10-CM | POA: Insufficient documentation

## 2018-12-25 DIAGNOSIS — M1990 Unspecified osteoarthritis, unspecified site: Secondary | ICD-10-CM

## 2018-12-25 DIAGNOSIS — I129 Hypertensive chronic kidney disease with stage 1 through stage 4 chronic kidney disease, or unspecified chronic kidney disease: Secondary | ICD-10-CM | POA: Diagnosis not present

## 2018-12-25 DIAGNOSIS — E7800 Pure hypercholesterolemia, unspecified: Secondary | ICD-10-CM | POA: Diagnosis not present

## 2018-12-25 DIAGNOSIS — C7951 Secondary malignant neoplasm of bone: Secondary | ICD-10-CM

## 2018-12-25 DIAGNOSIS — E1122 Type 2 diabetes mellitus with diabetic chronic kidney disease: Secondary | ICD-10-CM | POA: Insufficient documentation

## 2018-12-25 DIAGNOSIS — Z7984 Long term (current) use of oral hypoglycemic drugs: Secondary | ICD-10-CM | POA: Diagnosis not present

## 2018-12-25 DIAGNOSIS — Z7982 Long term (current) use of aspirin: Secondary | ICD-10-CM | POA: Diagnosis not present

## 2018-12-25 DIAGNOSIS — K219 Gastro-esophageal reflux disease without esophagitis: Secondary | ICD-10-CM | POA: Diagnosis not present

## 2018-12-25 DIAGNOSIS — I350 Nonrheumatic aortic (valve) stenosis: Secondary | ICD-10-CM | POA: Insufficient documentation

## 2018-12-25 DIAGNOSIS — I4892 Unspecified atrial flutter: Secondary | ICD-10-CM | POA: Diagnosis not present

## 2018-12-25 DIAGNOSIS — Z79899 Other long term (current) drug therapy: Secondary | ICD-10-CM | POA: Diagnosis not present

## 2018-12-25 MED ORDER — TRIAMCINOLONE ACETONIDE 0.025 % EX OINT: 1.0000 "application " | TOPICAL_OINTMENT | Freq: Two times a day (BID) | CUTANEOUS | 0 refills | Status: DC

## 2018-12-25 MED FILL — XTANDI 40 MG CAPSULE: 40 | 30 days supply | Qty: 120 | Fill #1

## 2018-12-25 NOTE — Progress Notes (Signed)
[facility] [place]  [phone] [phone]  Patient Care Team: Tamsen Roers, MD as PCP - General (Family Medicine) Abbie Sons, MD as Consulting Physician (Urology)   Name of the patient: [name]  [id number]  Nov 12, 1940   Date of visit: 12/25/18  Diagnosis- Prostate Cancer- Stage IV  Chief complaint/ Reason for visit- Rash on Feet  Heme/Onc history:  Oncology History   # AUG 2016- METASTATIC PROSTATE CA/ STAGE IV; Castrate sensitive [PSA- 48] ; mets- lumbar spine [s/p pal RT to Aug 2016; Dr.Crystal] /Pelvic LN; 1994- Prostate CA s/p [facility]]; Lupron q4 M; MARCH 2017- Bone scan- L4/Right pubic rami uptake; PSA- 0.1/testosterone-castrate [<3]  # Bone lesions on denosumab;DEC 2016- Recm q 37M  # NOV 2018- CASTRATE RESISTANT; declined chemo; 08/06/2017- start Zytiga+prednisone; Stopped April 24th [rising PSA];  # may 2019- Xofigo #6 on 10/16]  # OCT 16th 2019- X-tandi  # Left hydronephrosis s/p stenting [Dr.Budzyn] --------------------------------------------   DIAGNOSIS: [ ]  castrate resistant prostate cancer  STAGE:  IV       ;GOALS: palliative  CURRENT/MOST RECENT THERAPY ; Xtandi     Prostate cancer metastatic to bone [facility])    Interval history- [name], 78 year old male with above history of metastatic castrate resistant prostate cancer who [facility] for rash. Symptoms have been present for 1 week. The rash is located on the left foot/ankle. Since then it has spread to the right foot. He has tried lotions on the skin but doesn't feel it's improving. Rash isn't uncomfortable. No itching or fever. No recent antibiotics. No pain. No recent illness.  ECOG FS:3 - Symptomatic, >50% confined to bed  Review of systems- Review of Systems  Constitutional: Negative.  Negative for fever.  HENT: Negative.   Respiratory: Negative.   Cardiovascular: Negative.    Gastrointestinal: Negative.   Musculoskeletal: Negative.  Negative for falls.       Wheelchair; chronic pain  Skin: Positive for rash. Negative for itching.  Neurological: Negative for headaches.   Current treatmentBaird Lyons, Lupron  No Known Allergies  Past Medical History:  Diagnosis Date  . Aortic stenosis, moderate 01/30/2014  . Arthritis   . Atrial flutter ([place]) 02/02/2014  . Diabetes mellitus without complication (HCC)    diet control  . GERD (gastroesophageal reflux disease)   . Heart murmur   . Heart murmur   . History of kidney stones   . HTN (hypertension) 01/30/2014  . Hypercholesteremia   . Hypertension   . Prostate cancer ([place])    metastatic  . Weakness of left leg     Past Surgical History:  Procedure Laterality Date  . 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 CATARACT EXTRACTION W/ INTRAOCULAR LENS IMPLANT Bilateral   . COLON RESECTION    . CYSTOSCOPY W/ RETROGRADES Left 06/08/2015   Procedure: CYSTOSCOPY WITH RETROGRADE PYELOGRAM;  Surgeon: Nickie Retort, MD;  Location: [facility] OR[facility];  Service: Urology;  Laterality: Left;  . CYSTOSCOPY W/ RETROGRADES Left 05/13/2018   Procedure: CYSTOSCOPY WITH RETROGRADE PYELOGRAM;  Surgeon: Abbie Sons, MD;  Location: [facility];  Service: Urology;  Laterality: Left;  . CYSTOSCOPY W/ URETERAL STENT PLACEMENT Left 10/05/2015   Procedure: CYSTOSCOPY WITH STENT REPLACEMENT;  Surgeon: Nickie Retort, MD;  Location: [facility];  Service: Urology;  Laterality: Left;  .  CYSTOSCOPY W/ URETERAL STENT PLACEMENT Left 01/04/2016   Procedure: CYSTOSCOPY WITH STENT REPLACEMENT;  Surgeon: Nickie Retort, MD;  Location: [facility];  Service: Urology;  Laterality: Left;  . CYSTOSCOPY W/ URETERAL STENT PLACEMENT Left 04/11/2016   Procedure: CYSTOSCOPY WITH STENT REPLACEMENT;  Surgeon: Nickie Retort, MD;  Location:  [facility];  Service: Urology;  Laterality: Left;  . CYSTOSCOPY W/ URETERAL STENT PLACEMENT Left 07/18/2016   Procedure: CYSTOSCOPY WITH STENT REPLACEMENT;  Surgeon: Nickie Retort, MD;  Location: [facility];  Service: Urology;  Laterality: Left;  . CYSTOSCOPY W/ URETERAL STENT PLACEMENT Left 11/02/2016   Procedure: CYSTOSCOPY WITH STENT REPLACEMENT;  Surgeon: Nickie Retort, MD;  Location: [facility];  Service: Urology;  Laterality: Left;  . CYSTOSCOPY W/ URETERAL STENT PLACEMENT Left 02/08/2017   Procedure: CYSTOSCOPY WITH STENT REPLACEMENT;  Surgeon: Nickie Retort, MD;  Location: [facility];  Service: Urology;  Laterality: Left;  . CYSTOSCOPY W/ URETERAL STENT PLACEMENT Left 05/10/2017   Procedure: CYSTOSCOPY WITH STENT REPLACEMENT;  Surgeon: Nickie Retort, MD;  Location: [facility];  Service: Urology;  Laterality: Left;  . CYSTOSCOPY W/ URETERAL STENT PLACEMENT Left 05/13/2018   Procedure: CYSTOSCOPY WITH STENT Exchange;  Surgeon: Abbie Sons, MD;  Location: [facility];  Service: Urology;  Laterality: Left;  . CYSTOSCOPY WITH STENT PLACEMENT Left 08/16/2017   Procedure: CYSTOSCOPY WITH STENT EXCHANGE;  Surgeon: Abbie Sons, MD;  Location: [facility];  Service: Urology;  Laterality: Left;  . CYSTOSCOPY WITH STENT PLACEMENT Left 12/17/2017   Procedure: CYSTOSCOPY WITH STENT EXCHANGE;  Surgeon: Abbie Sons, MD;  Location: [facility];  Service: Urology;  Laterality: Left;  . EYE SURGERY Bilateral Sept and Oct.2012   cataract  . HERNIA REPAIR    . PROSTATECTOMY  1994    Social History   Socioeconomic History  . Marital status: Married    Spouse name: Not on file  . Number of children: Not on file  . Years of education: Not on file  . Highest education level: Not on file  Occupational History  . Not on file  Social Needs  . Financial resource strain: Not on file  . Food insecurity:    Worry: Not on file    Inability: Not on file  . Transportation needs:    Medical:  Not on file    Non-medical: Not on file  Tobacco Use  . Smoking status: Former Smoker    Packs/day: 1.50    Years: 25.00    Pack years: 37.50    Types: Cigarettes    Last attempt to quit: 01/21/1974    Years since quitting: 44.9  . Smokeless tobacco: Never Used  Substance and Sexual Activity  . Alcohol use: No  . Drug use: No  . Sexual activity: Not Currently  Lifestyle  . Physical activity:    Days per week: Not on file    Minutes per session: Not on file  . Stress: Not on file  Relationships  . Social connections:    Talks on phone: Not on file    Gets together: Not on file    Attends religious service: Not on file    Active member of club or organization: Not on file    Attends meetings of clubs or organizations: Not on file    Relationship status: Not on file  . Intimate partner violence:    Fear of current or ex partner: Not on file    Emotionally abused: Not on file  Physically abused: Not on file    Forced sexual activity: Not on file  Other Topics Concern  . Not on file  Social History Narrative  . Not on file    Family History  Problem Relation Age of Onset  . Aneurysm Father   . Cancer Mother        breast     Current Outpatient Medications:  .  allopurinol (ZYLOPRIM) 300 MG tablet, Take 300 mg by mouth daily. , Disp: , Rfl:  .  aspirin 81 MG tablet, Take 81 mg by mouth daily. , Disp: , Rfl:  .  atorvastatin (LIPITOR) 10 MG tablet, Take 10 mg by mouth daily with supper. , Disp: , Rfl:  .  carvedilol (COREG) 6.25 MG tablet, Take 1 tablet (6.25 mg total) by mouth 2 (two) times daily with a meal., Disp: 60 tablet, Rfl: 2 .  chlorthalidone (HYGROTON) 25 MG tablet, Take 25 mg by mouth daily. , Disp: , Rfl:  .  fentaNYL (DURAGESIC) 50 MCG/HR, Place 1 patch onto the skin every 3 (three) days., Disp: 10 patch, Rfl: 0 .  ferrous sulfate 325 (65 FE) MG tablet, Take 325 mg by mouth daily with breakfast. , Disp: , Rfl:  .  hydrochlorothiazide (MICROZIDE) 12.5 MG  capsule, Take 12.5 mg by mouth daily., Disp: , Rfl:  .  metFORMIN (GLUCOPHAGE) 500 MG tablet, Take 500 mg by mouth daily. , Disp: , Rfl:  .  mirabegron ER (MYRBETRIQ) 25 MG TB24 tablet, Take 1 tablet (25 mg total) by mouth daily., Disp: 30 tablet, Rfl: 11 .  oxybutynin (DITROPAN-XL) 5 MG 24 hr tablet, Take 1 tablet (5 mg total) by mouth 3 (three) times daily., Disp: 90 tablet, Rfl: 11 .  Oxycodone HCl 10 MG TABS, Take 1 tablet (10 mg total) by mouth every 6 (six) hours as needed (pain)., Disp: 45 tablet, Rfl: 0 .  pantoprazole (PROTONIX) 40 MG tablet, Take 1 tablet (40 mg total) by mouth daily before breakfast., Disp: 30 tablet, Rfl: 2 .  ranitidine (ZANTAC) 150 MG tablet, Take 150 mg by mouth at bedtime. , Disp: , Rfl:  .  vitamin B-12 (CYANOCOBALAMIN) 1000 MCG tablet, Take 1,000 mcg by mouth daily., Disp: , Rfl:  .  vitamin C (ASCORBIC ACID) 500 MG tablet, Take 500 mg by mouth daily., Disp: , Rfl:  .  Vitamin D, Ergocalciferol, (DRISDOL) 1.25 MG (50000 UT) CAPS capsule, Take 5,000 Units by mouth once a week. , Disp: , Rfl:  .  XTANDI 40 MG capsule, TAKE 4 CAPSULES (160 MG TOTAL) BY MOUTH DAILY., Disp: 120 capsule, Rfl: 4 .  diazepam (VALIUM) 5 MG tablet, One pill 45-60 mins prior to procedure; 1 pill 15 mins prior if needed. (Patient not taking: Reported on 10/23/2018), Disp: 2 tablet, Rfl: 0 .  naloxone (NARCAN) nasal spray 4 mg/0.1 mL, For use with opioid overdose. 1 spray delivered by intranasal administration. Call 911 (EMS) if used. (Patient not taking: Reported on 12/25/2018), Disp: 1 kit, Rfl: 0  Physical exam:  Vitals:   12/25/18 1115 12/25/18 1123  BP: (!) 146/79   Pulse: 86   Resp: 18   Temp: (!) 95.8 F (35.4 C)   TempSrc: Tympanic   Weight:  178 lb (80.7 kg)  Height:  5' 9"  (1.753 m)   Physical Exam Constitutional:      General: He is not in acute distress.    Comments: Elderly male. Unaccompanied. In wheelchair.   HENT:     Head: Normocephalic  and atraumatic.  Eyes:      General: No scleral icterus.    Conjunctiva/sclera: Conjunctivae normal.  Neck:     Musculoskeletal: Neck supple. No neck rigidity.  Cardiovascular:     Rate and Rhythm: Normal rate and regular rhythm.  Pulmonary:     Effort: Pulmonary effort is normal.     Breath sounds: Normal breath sounds.  Skin:    General: Skin is warm and dry.     Findings: Rash present. Rash is macular and scaling.     Comments: Rash on feet - left & right- (see included images)- nontender, nonpruitic, scaling mostly on soles. Evident calluses. 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   Neurological:     Mental Status: He is alert and oriented to person, place, and time.  Psychiatric:        Mood and Affect: Mood normal.        Behavior: Behavior normal.     CMP Latest Ref Rng & Units 12/01/2018  Glucose 70 - 99 mg/dL 140(H)  BUN 8 - 23 mg/dL 29(H)  Creatinine 0.61 - 1.24 mg/dL 1.39(H)  Sodium 135 - 145 mmol/L 136  Potassium 3.5 - 5.1 mmol/L 4.0  Chloride 98 - 111 mmol/L 99  CO2 22 - 32 mmol/L 28  Calcium 8.9 - 10.3 mg/dL 10.5(H)  Total Protein 6.5 - 8.1 g/dL 7.5  Total Bilirubin 0.3 - 1.2 mg/dL 0.6  Alkaline Phos 38 - 126 U/L 62  AST 15 - 41 U/L 19  ALT 0 - 44 U/L 11   CBC Latest Ref Rng & Units 12/01/2018  WBC 4.0 - 10.5 K/uL 6.8  Hemoglobin 13.0 - 17.0 g/dL 13.1  Hematocrit 39.0 - 52.0 % 39.5  Platelets 150 - 400 K/uL 165         Larger images available under 'media' tab.   No results found.  Assessment and plan- Patient is a 78 y.o. male diagnosed with metastatic prostate cancer who [facility].   1.  Rash-both feet, etiology unclear but questioning if xeroderma secondary to Xtandi vs vascular type rash vs others. No recent antibiotics or recent medication changes. Discussed with Dr. Rogue Bussing who recommends topical steroid cream trial while holding Xtandi and follow-up next week for reevaluation and possible biopsy.  I will  reach out to dermatology vs podiatry for input.   2. Prostate Cancer - metastatic to bone- Pet from 09/2018 showed no evidence of proressive disease, improved T9 lesion. PSA essentially stable. Pain stable and well controlled today. On Xgeva, Lupron, Xtandi. Hold Xtandi for now d/t above rash.   Plan - start triamcinolone topically to affected areas BID - hold xtandi - rtc as scheduled for follow-up with Dr. Rogue Bussing and myself on 12/29/2018   Visit Diagnosis 1. Prostate cancer metastatic to bone ([place])   2. Rash     Patient expressed understanding and was in agreement with this plan. He also understands that He can call clinic at any time with any questions, concerns, or complaints.   Thank you for allowing me to participate in the care of this very pleasant patient.   Beckey Rutter, DNP, AGNP-C [place] [place] (work cell) [phone] (office)  CC: Dr. Rogue Bussing

## 2018-12-26 ENCOUNTER — Telehealth: Payer: Self-pay

## 2018-12-26 ENCOUNTER — Encounter: Payer: Medicare HMO | Admitting: Nurse Practitioner

## 2018-12-26 NOTE — Telephone Encounter (Signed)
Called patient from recall list.  Patient stated that he did not want an appointment with is.  Refused Telehealth visit.  Patient stated if he started having any Symptoms he would call us.  Will delete recall.

## 2018-12-28 ENCOUNTER — Other Ambulatory Visit: Payer: Self-pay

## 2018-12-29 ENCOUNTER — Inpatient Hospital Stay: Payer: Medicare HMO

## 2018-12-29 ENCOUNTER — Encounter: Payer: Self-pay | Admitting: Internal Medicine

## 2018-12-29 ENCOUNTER — Other Ambulatory Visit: Payer: Self-pay

## 2018-12-29 ENCOUNTER — Inpatient Hospital Stay (HOSPITAL_BASED_OUTPATIENT_CLINIC_OR_DEPARTMENT_OTHER): Payer: Medicare HMO | Admitting: Nurse Practitioner

## 2018-12-29 ENCOUNTER — Inpatient Hospital Stay (HOSPITAL_BASED_OUTPATIENT_CLINIC_OR_DEPARTMENT_OTHER): Payer: Medicare HMO | Admitting: Internal Medicine

## 2018-12-29 VITALS — BP 156/74 | HR 76 | Temp 96.7°F | Resp 20 | Ht 69.0 in | Wt 175.6 lb

## 2018-12-29 DIAGNOSIS — M25551 Pain in right hip: Secondary | ICD-10-CM | POA: Diagnosis not present

## 2018-12-29 DIAGNOSIS — N1330 Unspecified hydronephrosis: Secondary | ICD-10-CM

## 2018-12-29 DIAGNOSIS — E7800 Pure hypercholesterolemia, unspecified: Secondary | ICD-10-CM

## 2018-12-29 DIAGNOSIS — C7951 Secondary malignant neoplasm of bone: Secondary | ICD-10-CM

## 2018-12-29 DIAGNOSIS — E1122 Type 2 diabetes mellitus with diabetic chronic kidney disease: Secondary | ICD-10-CM

## 2018-12-29 DIAGNOSIS — Z515 Encounter for palliative care: Secondary | ICD-10-CM

## 2018-12-29 DIAGNOSIS — N183 Chronic kidney disease, stage 3 (moderate): Secondary | ICD-10-CM

## 2018-12-29 DIAGNOSIS — C61 Malignant neoplasm of prostate: Secondary | ICD-10-CM

## 2018-12-29 DIAGNOSIS — Z191 Hormone sensitive malignancy status: Secondary | ICD-10-CM

## 2018-12-29 DIAGNOSIS — Z7984 Long term (current) use of oral hypoglycemic drugs: Secondary | ICD-10-CM

## 2018-12-29 DIAGNOSIS — I4892 Unspecified atrial flutter: Secondary | ICD-10-CM

## 2018-12-29 DIAGNOSIS — Z87891 Personal history of nicotine dependence: Secondary | ICD-10-CM

## 2018-12-29 DIAGNOSIS — R21 Rash and other nonspecific skin eruption: Secondary | ICD-10-CM | POA: Diagnosis not present

## 2018-12-29 DIAGNOSIS — Z79899 Other long term (current) drug therapy: Secondary | ICD-10-CM

## 2018-12-29 DIAGNOSIS — Z7982 Long term (current) use of aspirin: Secondary | ICD-10-CM

## 2018-12-29 DIAGNOSIS — I350 Nonrheumatic aortic (valve) stenosis: Secondary | ICD-10-CM

## 2018-12-29 DIAGNOSIS — R531 Weakness: Secondary | ICD-10-CM

## 2018-12-29 DIAGNOSIS — M1990 Unspecified osteoarthritis, unspecified site: Secondary | ICD-10-CM

## 2018-12-29 DIAGNOSIS — I129 Hypertensive chronic kidney disease with stage 1 through stage 4 chronic kidney disease, or unspecified chronic kidney disease: Secondary | ICD-10-CM

## 2018-12-29 DIAGNOSIS — Z87442 Personal history of urinary calculi: Secondary | ICD-10-CM

## 2018-12-29 DIAGNOSIS — K219 Gastro-esophageal reflux disease without esophagitis: Secondary | ICD-10-CM

## 2018-12-29 DIAGNOSIS — Z923 Personal history of irradiation: Secondary | ICD-10-CM

## 2018-12-29 LAB — COMPREHENSIVE METABOLIC PANEL
ALT: 12 U/L (ref 0–44)
AST: 22 U/L (ref 15–41)
Albumin: 3.6 g/dL (ref 3.5–5.0)
Alkaline Phosphatase: 71 U/L (ref 38–126)
Anion gap: 12 (ref 5–15)
BUN: 22 mg/dL (ref 8–23)
CO2: 25 mmol/L (ref 22–32)
Calcium: 10.2 mg/dL (ref 8.9–10.3)
Chloride: 98 mmol/L (ref 98–111)
Creatinine, Ser: 1.34 mg/dL — ABNORMAL HIGH (ref 0.61–1.24)
GFR calc Af Amer: 59 mL/min — ABNORMAL LOW (ref >60)
GFR calc non Af Amer: 51 mL/min — ABNORMAL LOW (ref >60)
Glucose, Bld: 126 mg/dL — ABNORMAL HIGH (ref 70–99)
Potassium: 3.7 mmol/L (ref 3.5–5.1)
Sodium: 135 mmol/L (ref 135–145)
Total Bilirubin: 0.5 mg/dL (ref 0.3–1.2)
Total Protein: 7.3 g/dL (ref 6.5–8.1)

## 2018-12-29 LAB — CBC WITH DIFFERENTIAL/PLATELET
Abs Immature Granulocytes: 0.05 10*3/uL (ref 0.00–0.07)
Basophils Absolute: 0.1 10*3/uL (ref 0.0–0.1)
Basophils Relative: 1 %
Eosinophils Absolute: 0.2 10*3/uL (ref 0.0–0.5)
Eosinophils Relative: 3 %
HCT: 38 % — ABNORMAL LOW (ref 39.0–52.0)
Hemoglobin: 12.5 g/dL — ABNORMAL LOW (ref 13.0–17.0)
Immature Granulocytes: 1 %
Lymphocytes Relative: 12 %
Lymphs Abs: 0.9 10*3/uL (ref 0.7–4.0)
MCH: 30.3 pg (ref 26.0–34.0)
MCHC: 32.9 g/dL (ref 30.0–36.0)
MCV: 92 fL (ref 80.0–100.0)
Monocytes Absolute: 0.7 10*3/uL (ref 0.1–1.0)
Monocytes Relative: 9 %
Neutro Abs: 6 10*3/uL (ref 1.7–7.7)
Neutrophils Relative %: 74 %
Platelets: 181 10*3/uL (ref 150–400)
RBC: 4.13 MIL/uL — ABNORMAL LOW (ref 4.22–5.81)
RDW: 14.7 % (ref 11.5–15.5)
WBC: 7.9 10*3/uL (ref 4.0–10.5)
nRBC: 0 % (ref 0.0–0.2)

## 2018-12-29 LAB — PSA: Prostatic Specific Antigen: 6.39 ng/mL — ABNORMAL HIGH (ref 0.00–4.00)

## 2018-12-29 MED ORDER — DENOSUMAB 120 MG/1.7ML ~~LOC~~ SOLN
120.0000 mg | Freq: Once | SUBCUTANEOUS | Status: AC
  Administered 2018-12-29: 11:00:00 120 mg via SUBCUTANEOUS
  Filled 2018-12-29: qty 1.7

## 2018-12-29 NOTE — Progress Notes (Signed)
[place]  [phone] [phone]   Name: [name] Date: 12/29/2018 MRN: [id number]  DOB: February 04, 1941  Patient Care Team: Tamsen Roers, MD as PCP - General (Family Medicine) Bernardo Heater, Ronda Fairly, MD as Consulting Physician (Urology)    REASON FOR CONSULTATION: Palliative Care consult requested for this 78 y.o. 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  PMH also notable for hydronephrosis status post stenting, moderate aortic stenosis, hypertension, diabetes.  Patient has had severe hip pain.  He was referred to palliative care for help with symptom management and to address goals.  SOCIAL HISTORY:    Patient is married and lives at home with his wife.  He has several adult children.  He is a retired Patent examiner for [employer].  ADVANCE DIRECTIVES:  Not on file  CODE STATUS: DNR  PAST MEDICAL HISTORY: Past Medical History:  Diagnosis Date  . Aortic stenosis, moderate 01/30/2014  . Arthritis   . Atrial flutter ([place]) 02/02/2014  . Diabetes mellitus without complication (HCC)    diet control  . GERD (gastroesophageal reflux disease)   . Heart murmur   . Heart murmur   . History of kidney stones   . HTN (hypertension) 01/30/2014  . Hypercholesteremia   . Hypertension   . Prostate cancer ([place])    metastatic  . Weakness of left leg     PAST SURGICAL HISTORY:  Past Surgical History:  Procedure Laterality Date  . 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 CATARACT EXTRACTION W/ INTRAOCULAR LENS IMPLANT Bilateral   . COLON RESECTION    . CYSTOSCOPY W/ RETROGRADES Left 06/08/2015   Procedure: CYSTOSCOPY WITH RETROGRADE PYELOGRAM;  Surgeon: Nickie Retort, MD;  Location: [facility];  Service:  Urology;  Laterality: Left;  . CYSTOSCOPY W/ RETROGRADES Left 05/13/2018   Procedure: CYSTOSCOPY WITH RETROGRADE PYELOGRAM;  Surgeon: Abbie Sons, MD;  Location: [facility];  Service: Urology;  Laterality: Left;  . CYSTOSCOPY W/ URETERAL STENT PLACEMENT Left 10/05/2015   Procedure: CYSTOSCOPY WITH STENT REPLACEMENT;  Surgeon: Nickie Retort, MD;  Location: [facility];  Service: Urology;  Laterality: Left;  . CYSTOSCOPY W/ URETERAL STENT PLACEMENT Left 01/04/2016   Procedure: CYSTOSCOPY WITH STENT REPLACEMENT;  Surgeon: Nickie Retort, MD;  Location: [facility];  Service: Urology;  Laterality: Left;  . CYSTOSCOPY W/ URETERAL STENT PLACEMENT Left 04/11/2016   Procedure: CYSTOSCOPY WITH STENT REPLACEMENT;  Surgeon: Nickie Retort, MD;  Location: [facility];  Service: Urology;  Laterality: Left;  . CYSTOSCOPY W/ URETERAL STENT PLACEMENT Left 07/18/2016   Procedure: CYSTOSCOPY WITH STENT REPLACEMENT;  Surgeon: Nickie Retort, MD;  Location: [facility];  Service: Urology;  Laterality: Left;  . CYSTOSCOPY W/ URETERAL STENT PLACEMENT Left 11/02/2016   Procedure: CYSTOSCOPY WITH STENT REPLACEMENT;  Surgeon: Nickie Retort, MD;  Location: [facility];  Service: Urology;  Laterality: Left;  . CYSTOSCOPY W/ URETERAL STENT PLACEMENT Left 02/08/2017   Procedure: CYSTOSCOPY WITH STENT REPLACEMENT;  Surgeon: Nickie Retort, MD;  Location: [facility];  Service: Urology;  Laterality: Left;  . CYSTOSCOPY W/ URETERAL STENT PLACEMENT Left 05/10/2017   Procedure: CYSTOSCOPY WITH STENT REPLACEMENT;  Surgeon: Nickie Retort, MD;  Location: [facility];  Service: Urology;  Laterality: Left;  . CYSTOSCOPY  W/ URETERAL STENT PLACEMENT Left 05/13/2018   Procedure: CYSTOSCOPY WITH STENT Exchange;  Surgeon: Abbie Sons, MD;  Location: [facility];  Service: Urology;  Laterality: Left;  . CYSTOSCOPY WITH STENT PLACEMENT Left 08/16/2017   Procedure: CYSTOSCOPY WITH STENT EXCHANGE;  Surgeon: Abbie Sons, MD;   Location: [facility];  Service: Urology;  Laterality: Left;  . CYSTOSCOPY WITH STENT PLACEMENT Left 12/17/2017   Procedure: CYSTOSCOPY WITH STENT EXCHANGE;  Surgeon: Abbie Sons, MD;  Location: [facility];  Service: Urology;  Laterality: Left;  . EYE SURGERY Bilateral Sept and Oct.2012   cataract  . HERNIA REPAIR    . PROSTATECTOMY  1994    HEMATOLOGY/ONCOLOGY HISTORY:  Oncology History   # AUG 2016- METASTATIC PROSTATE CA/ STAGE IV; Castrate sensitive [PSA- 48] ; mets- lumbar spine [s/p pal RT to Aug 2016; Dr.Crystal] /Pelvic LN; 1994- Prostate CA s/p Surgery [facility]]; Lupron q4 M; MARCH 2017- Bone scan- L4/Right pubic rami uptake; PSA- 0.1/testosterone-castrate [<3]  # Bone lesions on denosumab;DEC 2016- Recm q 4M  # NOV 2018- CASTRATE RESISTANT; declined chemo; 08/06/2017- start Zytiga+prednisone; Stopped April 24th [rising PSA];  # may 2019- Xofigo #6 on 10/16]  # OCT 16th 2019- X-tandi  # Left hydronephrosis s/p stenting [Dr.Budzyn] --------------------------------------------   DIAGNOSIS: [ ]  castrate resistant prostate cancer  STAGE:  IV       ;GOALS: palliative  CURRENT/MOST RECENT THERAPY ; Xtandi     Prostate cancer metastatic to bone [facility])    ALLERGIES:  has No Known Allergies.  MEDICATIONS:  Current Outpatient Medications  Medication Sig Dispense Refill  . allopurinol (ZYLOPRIM) 300 MG tablet Take 300 mg by mouth daily.     Marland Kitchen aspirin 81 MG tablet Take 81 mg by mouth daily.     Marland Kitchen atorvastatin (LIPITOR) 10 MG tablet Take 10 mg by mouth daily with supper.     . carvedilol (COREG) 6.25 MG tablet Take 1 tablet (6.25 mg total) by mouth 2 (two) times daily with a meal. 60 tablet 2  . chlorthalidone (HYGROTON) 25 MG tablet Take 25 mg by mouth daily.     . diazepam (VALIUM) 5 MG tablet One pill 45-60 mins prior to procedure; 1 pill 15 mins prior if needed. (Patient not taking: Reported on 10/23/2018) 2 tablet 0  . fentaNYL (DURAGESIC) 50 MCG/HR Place 1 patch onto the  skin every 3 (three) days. 10 patch 0  . ferrous sulfate 325 (65 FE) MG tablet Take 325 mg by mouth daily with breakfast.     . hydrochlorothiazide (MICROZIDE) 12.5 MG capsule Take 12.5 mg by mouth daily.    . metFORMIN (GLUCOPHAGE) 500 MG tablet Take 500 mg by mouth daily.     . mirabegron ER (MYRBETRIQ) 25 MG TB24 tablet Take 1 tablet (25 mg total) by mouth daily. 30 tablet 11  . naloxone (NARCAN) nasal spray 4 mg/0.1 mL For use with opioid overdose. 1 spray delivered by intranasal administration. Call 911 (EMS) if used. (Patient not taking: Reported on 12/25/2018) 1 kit 0  . oxybutynin (DITROPAN-XL) 5 MG 24 hr tablet Take 1 tablet (5 mg total) by mouth 3 (three) times daily. 90 tablet 11  . Oxycodone HCl 10 MG TABS Take 1 tablet (10 mg total) by mouth every 6 (six) hours as needed (pain). 45 tablet 0  . pantoprazole (PROTONIX) 40 MG tablet Take 1 tablet (40 mg total) by mouth daily before breakfast. 30 tablet 2  . ranitidine (ZANTAC) 150 MG tablet Take 150  mg by mouth at bedtime.     . triamcinolone (KENALOG) 0.025 % ointment Apply 1 application topically 2 (two) times daily. Apply to clean, dry feet as directed 80 g 0  . vitamin B-12 (CYANOCOBALAMIN) 1000 MCG tablet Take 1,000 mcg by mouth daily.    . vitamin C (ASCORBIC ACID) 500 MG tablet Take 500 mg by mouth daily.    . Vitamin D, Ergocalciferol, (DRISDOL) 1.25 MG (50000 UT) CAPS capsule Take 5,000 Units by mouth once a week.     Gillermina Phy 40 MG capsule TAKE 4 CAPSULES (160 MG TOTAL) BY MOUTH DAILY. (Patient not taking: Reported on 12/29/2018) 120 capsule 4   No current facility-administered medications for this visit.     VITAL SIGNS: There were no vitals taken for this visit. There were no vitals filed for this visit.  Estimated body mass index is 25.93 kg/m as calculated from the following:   Height as of an earlier encounter on 12/29/18: 5' 9"  (1.753 m).   Weight as of an earlier encounter on 12/29/18: 175 lb 9.6 oz (79.7 kg).  LABS:  CBC:    Component Value Date/Time   WBC 7.9 12/29/2018 0956   HGB 12.5 (L) 12/29/2018 0956   HCT 38.0 (L) 12/29/2018 0956   PLT 181 12/29/2018 0956   MCV 92.0 12/29/2018 0956   NEUTROABS 6.0 12/29/2018 0956   LYMPHSABS 0.9 12/29/2018 0956   MONOABS 0.7 12/29/2018 0956   EOSABS 0.2 12/29/2018 0956   BASOSABS 0.1 12/29/2018 0956   Comprehensive Metabolic Panel:    Component Value Date/Time   NA 135 12/29/2018 0956   K 3.7 12/29/2018 0956   CL 98 12/29/2018 0956   CO2 25 12/29/2018 0956   BUN 22 12/29/2018 0956   CREATININE 1.34 (H) 12/29/2018 0956   GLUCOSE 126 (H) 12/29/2018 0956   CALCIUM 10.2 12/29/2018 0956   AST 22 12/29/2018 0956   ALT 12 12/29/2018 0956   ALKPHOS 71 12/29/2018 0956   BILITOT 0.5 12/29/2018 0956   PROT 7.3 12/29/2018 0956   ALBUMIN 3.6 12/29/2018 0956    RADIOGRAPHIC STUDIES: No results found.  PERFORMANCE STATUS (ECOG) : 3 - Symptomatic, >50% confined to bed  Review of Systems  Constitutional: Negative for activity change, appetite change, fatigue and unexpected weight change.  HENT: Negative for mouth sores and trouble swallowing.   Eyes: Negative for pain and visual disturbance.  Respiratory: Negative for cough and shortness of breath.   Cardiovascular: Negative for chest pain and leg swelling.  Gastrointestinal: Negative for abdominal distention, abdominal pain, constipation, diarrhea and nausea.  Endocrine: Negative for cold intolerance and polyuria.  Genitourinary: Negative for decreased urine volume and difficulty urinating.  Musculoskeletal: Positive for gait problem. Negative for arthralgias and myalgias.       Chronic hip pain; uses wheelchair  Skin: Positive for rash (feet). Negative for wound.  Neurological: Positive for weakness. Negative for dizziness and light-headedness.  Psychiatric/Behavioral: Negative for sleep disturbance. The patient is not nervous/anxious.     Physical Exam Constitutional:      General: He is not in  acute distress.    Comments: Frail appearing. Thin  HENT:     Head: Normocephalic and atraumatic.     Nose: No rhinorrhea.     Mouth/Throat:     Mouth: Mucous membranes are moist.     Pharynx: Oropharynx is clear.  Eyes:     General: No scleral icterus.    Conjunctiva/sclera: Conjunctivae normal.  Cardiovascular:  Rate and Rhythm: Normal rate and regular rhythm.  Pulmonary:     Effort: Pulmonary effort is normal.     Breath sounds: Normal breath sounds.  Abdominal:     Palpations: Abdomen is soft.     Tenderness: There is no abdominal tenderness.  Musculoskeletal:        General: No signs of injury.     Comments: No aids  Skin:    General: Skin is warm and dry.     Findings: Rash (feet) present.  Neurological:     Mental Status: He is alert and oriented to person, place, and time.     Motor: Weakness present.  Psychiatric:        Mood and Affect: Mood normal.        Behavior: Behavior normal.     IMPRESSION: I met with patient today in clinic.  He has seen Dr. Rogue Bussing and will restart Gillermina Phy given rising PSA.  He was seen in symptom management clinic last week for rash on his feet and prescribed triamcinolone and asked to hold Xtandi at that time.  Today, he says that he started ointment for feet yesterday and they are unchanged.  Unclear etiology of rash but macular in nature and does not appear to be vascular.  Will hold off on biopsy at this time.  Given risk of xeroderma with Xtandi, could consider urea cream if no response/unsatisfactory results to triamcinolone.  Right hip pain is stable and he feels pain is well controlled.  He was previously seen by Ortho and received injection and has received radiation to the area.  He is unsure if he needs refills today.  Currently taking oxycodone 10 mg every 6 hours as needed with fentanyl patch 50 mcg every 72 hours.  He finds an increased dose of oxycodone improves his pain.  He does not think that he has to take it every 6  hours.  Bowels moving regularly.  We discussed most form and I briefly introduced advanced care planning.  He will take form home to discuss with his family.  Community palliative care following.  PDMP reviewed.  Last fill of oxycodone on 12/22/2018, #45 tablets. Last fill of fentanyl patches on 11/24/2018, #10. Will send refill today.    PLAN: - Continue current scope of treatment including restarting xtandi per Dr. Rogue Bussing - took home MOST form to review with his family; review at next visit.  - continue pain medication as prescribed: Fentanyl patch 50 mcg every 72 hours- refilled today. Oxycodone 10 mg every 6 hours as needed for breakthrough pain. -OTC bowel regimen to avoid opioid-induced constipation with goal BM every 1 to 2 days - rtc in 4 weeks for follow up with Palliative Care or sooner if needed   Patient expressed understanding and was in agreement with this plan. He also understands that He can call clinic at any time with any questions, concerns, or complaints.    Time Total: 15 minutes  Visit consisted of counseling and education dealing with the complex and emotionally intense issues of symptom management and palliative care in the setting of serious and potentially life-threatening illness.Greater than 50%  of this time was spent counseling and coordinating care related to the above assessment and plan.  Signed by: Beckey Rutter, DNP, AGNP-C [facility] (work cell) [phone] (office)  CC: Dr. Rogue Bussing

## 2018-12-29 NOTE — Progress Notes (Signed)
[place] OFFICE PROGRESS NOTE  Patient Care Team: Tamsen Roers, MD as PCP - General (Family Medicine) Abbie Sons, MD as Consulting Physician (Urology)  Cancer Staging No matching staging information was found for the patient.   Oncology History   # AUG 2016- METASTATIC PROSTATE CA/ STAGE IV; Castrate sensitive [PSA- 48] ; mets- lumbar spine [s/p pal RT to Aug 2016; Dr.Crystal] /Pelvic LN; 1994- Prostate CA s/p Surgery [facility]]; Lupron q4 M; MARCH 2017- Bone scan- L4/Right pubic rami uptake; PSA- 0.1/testosterone-castrate [<3]  # Bone lesions on denosumab;DEC 2016- Recm q 58M  # NOV 2018- CASTRATE RESISTANT; declined chemo; 08/06/2017- start Zytiga+prednisone; Stopped April 24th [rising PSA];  # may 2019- Xofigo #6 on 10/16]  # OCT 16th 2019- X-tandi  # Left hydronephrosis s/p stenting [Dr.Budzyn] --------------------------------------------   DIAGNOSIS: _0  castrate resistant prostate cancer  STAGE:  IV       ;GOALS: palliative  CURRENT/MOST RECENT THERAPY ; Xtandi     Prostate cancer metastatic to bone [facility])      INTERVAL HISTORY:  [name] 78 y.o.  male pleasant patient above history of castrate resistant prostate cancer is here for follow-up.  In the interim patient was evaluated by by [facility] for bil foot rash. On triamcinolone-however started just yesterday.  No significant improvement noted yet.  Is not getting any worse.  No symptoms of itching or pain in his feet.  Continues to have chronic stable pain of his right hip.    Review of Systems  Constitutional: Positive for malaise/fatigue. Negative for chills, diaphoresis, fever and weight loss.  HENT: Negative for nosebleeds and sore throat.   Eyes: Negative for double vision.  Respiratory: Negative for cough, hemoptysis, sputum production, shortness of breath and wheezing.   Cardiovascular: Negative for chest pain, palpitations and orthopnea.  Gastrointestinal: Negative for  abdominal pain, blood in stool, constipation, diarrhea, heartburn, melena, nausea and vomiting.  Genitourinary: Negative for dysuria, frequency and urgency.  Musculoskeletal: Positive for back pain and joint pain.  Skin: Positive for rash. Negative for itching.  Neurological: Negative for dizziness, tingling, focal weakness, weakness and headaches.  Endo/Heme/Allergies: Does not bruise/bleed easily.  Psychiatric/Behavioral: Negative for depression. The patient is not nervous/anxious and does not have insomnia.       PAST MEDICAL HISTORY :  Past Medical History:  Diagnosis Date  . Aortic stenosis, moderate 01/30/2014  . Arthritis   . Atrial flutter ([place]) 02/02/2014  . Diabetes mellitus without complication (HCC)    diet control  . GERD (gastroesophageal reflux disease)   . Heart murmur   . Heart murmur   . History of kidney stones   . HTN (hypertension) 01/30/2014  . Hypercholesteremia   . Hypertension   . Prostate cancer ([place])    metastatic  . Weakness of left leg     PAST SURGICAL HISTORY :   Past Surgical History:  Procedure Laterality Date  . 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 CATARACT EXTRACTION W/ INTRAOCULAR LENS IMPLANT Bilateral   . COLON RESECTION    . CYSTOSCOPY W/ RETROGRADES Left 06/08/2015   Procedure: CYSTOSCOPY WITH RETROGRADE PYELOGRAM;  Surgeon: Nickie Retort, MD;  Location: [facility] OR[facility];  Service: Urology;  Laterality: Left;  . CYSTOSCOPY W/ RETROGRADES Left 05/13/2018   Procedure: CYSTOSCOPY WITH RETROGRADE PYELOGRAM;  Surgeon: Abbie Sons, MD;  Location: [facility] OR[facility];  Service: Urology;  Laterality: Left;  . CYSTOSCOPY W/ URETERAL STENT PLACEMENT Left 10/05/2015   Procedure: CYSTOSCOPY WITH STENT REPLACEMENT;  Surgeon: Nickie Retort, MD;  Location: [facility] OR[facility];  Service: Urology;  Laterality: Left;  . CYSTOSCOPY W/ URETERAL STENT  PLACEMENT Left 01/04/2016   Procedure: CYSTOSCOPY WITH STENT REPLACEMENT;  Surgeon: Nickie Retort, MD;  Location: [facility];  Service: Urology;  Laterality: Left;  . CYSTOSCOPY W/ URETERAL STENT PLACEMENT Left 04/11/2016   Procedure: CYSTOSCOPY WITH STENT REPLACEMENT;  Surgeon: Nickie Retort, MD;  Location: [facility];  Service: Urology;  Laterality: Left;  . CYSTOSCOPY W/ URETERAL STENT PLACEMENT Left 07/18/2016   Procedure: CYSTOSCOPY WITH STENT REPLACEMENT;  Surgeon: Nickie Retort, MD;  Location: [facility];  Service: Urology;  Laterality: Left;  . CYSTOSCOPY W/ URETERAL STENT PLACEMENT Left 11/02/2016   Procedure: CYSTOSCOPY WITH STENT REPLACEMENT;  Surgeon: Nickie Retort, MD;  Location: [facility];  Service: Urology;  Laterality: Left;  . CYSTOSCOPY W/ URETERAL STENT PLACEMENT Left 02/08/2017   Procedure: CYSTOSCOPY WITH STENT REPLACEMENT;  Surgeon: Nickie Retort, MD;  Location: [facility];  Service: Urology;  Laterality: Left;  . CYSTOSCOPY W/ URETERAL STENT PLACEMENT Left 05/10/2017   Procedure: CYSTOSCOPY WITH STENT REPLACEMENT;  Surgeon: Nickie Retort, MD;  Location: [facility];  Service: Urology;  Laterality: Left;  . CYSTOSCOPY W/ URETERAL STENT PLACEMENT Left 05/13/2018   Procedure: CYSTOSCOPY WITH STENT Exchange;  Surgeon: Abbie Sons, MD;  Location: [facility];  Service: Urology;  Laterality: Left;  . CYSTOSCOPY WITH STENT PLACEMENT Left 08/16/2017   Procedure: CYSTOSCOPY WITH STENT EXCHANGE;  Surgeon: Abbie Sons, MD;  Location: [facility];  Service: Urology;  Laterality: Left;  . CYSTOSCOPY WITH STENT PLACEMENT Left 12/17/2017   Procedure: CYSTOSCOPY WITH STENT EXCHANGE;  Surgeon: Abbie Sons, MD;  Location: [facility];  Service: Urology;  Laterality: Left;  . EYE SURGERY Bilateral Sept and Oct.2012   cataract  . HERNIA REPAIR    . PROSTATECTOMY  1994    FAMILY HISTORY :   Family History  Problem Relation Age of Onset  . Aneurysm Father   . Cancer  Mother        breast    SOCIAL HISTORY:   Social History   Tobacco Use  . Smoking status: Former Smoker    Packs/day: 1.50    Years: 25.00    Pack years: 37.50    Types: Cigarettes    Last attempt to quit: 01/21/1974    Years since quitting: 44.9  . Smokeless tobacco: Never Used  Substance Use Topics  . Alcohol use: No  . Drug use: No    ALLERGIES:  has No Known Allergies.  MEDICATIONS:  Current Outpatient Medications  Medication Sig Dispense Refill  . allopurinol (ZYLOPRIM) 300 MG tablet Take 300 mg by mouth daily.     Marland Kitchen aspirin 81 MG tablet Take 81 mg by mouth daily.     Marland Kitchen atorvastatin (LIPITOR) 10 MG tablet Take 10 mg by mouth daily with supper.     . carvedilol (COREG) 6.25 MG tablet Take 1 tablet (6.25 mg total) by mouth 2 (two) times daily with a meal. 60 tablet 2  . chlorthalidone (HYGROTON) 25 MG tablet Take 25 mg by mouth daily.     . fentaNYL (DURAGESIC) 50 MCG/HR Place 1 patch onto the skin every 3 (three) days. 10 patch 0  . ferrous sulfate 325 (65 FE) MG tablet Take 325 mg by mouth daily with  breakfast.     . hydrochlorothiazide (MICROZIDE) 12.5 MG capsule Take 12.5 mg by mouth daily.    . metFORMIN (GLUCOPHAGE) 500 MG tablet Take 500 mg by mouth daily.     . mirabegron ER (MYRBETRIQ) 25 MG TB24 tablet Take 1 tablet (25 mg total) by mouth daily. 30 tablet 11  . oxybutynin (DITROPAN-XL) 5 MG 24 hr tablet Take 1 tablet (5 mg total) by mouth 3 (three) times daily. 90 tablet 11  . Oxycodone HCl 10 MG TABS Take 1 tablet (10 mg total) by mouth every 6 (six) hours as needed (pain). 45 tablet 0  . pantoprazole (PROTONIX) 40 MG tablet Take 1 tablet (40 mg total) by mouth daily before breakfast. 30 tablet 2  . ranitidine (ZANTAC) 150 MG tablet Take 150 mg by mouth at bedtime.     . triamcinolone (KENALOG) 0.025 % ointment Apply 1 application topically 2 (two) times daily. Apply to clean, dry feet as directed 80 g 0  . vitamin B-12 (CYANOCOBALAMIN) 1000 MCG tablet Take 1,000  mcg by mouth daily.    . vitamin C (ASCORBIC ACID) 500 MG tablet Take 500 mg by mouth daily.    . Vitamin D, Ergocalciferol, (DRISDOL) 1.25 MG (50000 UT) CAPS capsule Take 5,000 Units by mouth once a week.     . diazepam (VALIUM) 5 MG tablet One pill 45-60 mins prior to procedure; 1 pill 15 mins prior if needed. (Patient not taking: Reported on 10/23/2018) 2 tablet 0  . naloxone (NARCAN) nasal spray 4 mg/0.1 mL For use with opioid overdose. 1 spray delivered by intranasal administration. Call 911 (EMS) if used. (Patient not taking: Reported on 12/25/2018) 1 kit 0  . XTANDI 40 MG capsule TAKE 4 CAPSULES (160 MG TOTAL) BY MOUTH DAILY. (Patient not taking: Reported on 12/29/2018) 120 capsule 4   No current facility-administered medications for this visit.    Facility-Administered Medications Ordered in Other Visits  Medication Dose Route Frequency Provider Last Rate Last Dose  . denosumab (XGEVA) injection 120 mg  120 mg Subcutaneous Once Cammie Sickle, MD        PHYSICAL EXAMINATION: ECOG PERFORMANCE STATUS: 1 - Symptomatic but completely ambulatory  BP (!) 156/74   Pulse 76   Temp (!) 96.7 F (35.9 C) (Tympanic)   Resp 20   Ht _0  (1.753 m)   Wt 175 lb 9.6 oz (79.7 kg)   BMI 25.93 kg/m   Filed Weights   12/29/18 1018  Weight: 175 lb 9.6 oz (79.7 kg)    Physical Exam  Constitutional: He is oriented to person, place, and time and well-developed, well-nourished, and in no distress.  In wheel chair.  Alone.  HENT:  Head: Normocephalic and atraumatic.  Mouth/Throat: Oropharynx is clear and moist. No oropharyngeal exudate.  Eyes: Pupils are equal, round, and reactive to light.  Neck: Normal range of motion. Neck supple.  Cardiovascular: Normal rate and regular rhythm.  Pulmonary/Chest: No respiratory distress. He has no wheezes.  Abdominal: Soft. Bowel sounds are normal. He exhibits no distension and no mass. There is no abdominal tenderness. There is no rebound and no  guarding.  Musculoskeletal:        General: No tenderness or edema.  Neurological: He is alert and oriented to person, place, and time.  Skin: Skin is warm.  Bilateral soles of the feet-purplish/erythematous rash.  Nonpainful.  Blanching.  No petechiae or purpura noted.  Psychiatric: Affect normal.       LABORATORY DATA:  I have reviewed the data as listed    Component Value Date/Time   NA 135 12/29/2018 0956   K 3.7 12/29/2018 0956   CL 98 12/29/2018 0956   CO2 25 12/29/2018 0956   GLUCOSE 126 (H) 12/29/2018 0956   BUN 22 12/29/2018 0956   CREATININE 1.34 (H) 12/29/2018 0956   CALCIUM 10.2 12/29/2018 0956   PROT 7.3 12/29/2018 0956   ALBUMIN 3.6 12/29/2018 0956   AST 22 12/29/2018 0956   ALT 12 12/29/2018 0956   ALKPHOS 71 12/29/2018 0956   BILITOT 0.5 12/29/2018 0956   GFRNONAA 51 (L) 12/29/2018 0956   GFRAA 59 (L) 12/29/2018 0956    No results found for: SPEP, UPEP  Lab Results  Component Value Date   WBC 7.9 12/29/2018   NEUTROABS 6.0 12/29/2018   HGB 12.5 (L) 12/29/2018   HCT 38.0 (L) 12/29/2018   MCV 92.0 12/29/2018   PLT 181 12/29/2018      Chemistry      Component Value Date/Time   NA 135 12/29/2018 0956   K 3.7 12/29/2018 0956   CL 98 12/29/2018 0956   CO2 25 12/29/2018 0956   BUN 22 12/29/2018 0956   CREATININE 1.34 (H) 12/29/2018 0956      Component Value Date/Time   CALCIUM 10.2 12/29/2018 0956   ALKPHOS 71 12/29/2018 0956   AST 22 12/29/2018 0956   ALT 12 12/29/2018 0956   BILITOT 0.5 12/29/2018 0956       RADIOGRAPHIC STUDIES: I have personally reviewed the radiological images as listed and agreed with the findings in the report. No results found.   ASSESSMENT & PLAN:  Prostate cancer metastatic to bone [facility]) # Metastatic castrate resistant prostate cancer-Lupron q 4 M [07/30/2018] and Xgeva; February 2020 aux PET scan-no evidence of progressive disease in the pelvis; improving T9 lesion; right posterior rib lesion.  April 2020 PSA  6.4; rising.   Clinically stable.  #Proceed with Delton See; continue Xtandi for now-especially as the rash [discussed below] is asymptomatic.  # Rash on bil feet-unclear etiology.  Monitor closely continue traimacinolone.   # Right hip pain- status post radiation; stable.  Continue oxycodone 10 mg every 4 hours.  Also continue fentanyl patch.  Stable.   # Bone mets-on Xgeva monthly.  Calcium normal.  Stable  # CKD- stage III; creatinine 1.2-stable.   # DISPOSITION:  #  X-geva today # Follow up  In 5 weeks-MD- labs-cbc/cmp/psa; X-geva-Dr.B   No orders of the defined types were placed in this encounter.  All questions were answered. The patient knows to call the clinic with any problems, questions or concerns.      Cammie Sickle, MD 12/29/2018 11:04 AM

## 2018-12-29 NOTE — Assessment & Plan Note (Addendum)
#   Metastatic castrate resistant prostate cancer-Lupron q 4 M [07/30/2018] and Xgeva; February 2020 aux PET scan-no evidence of progressive disease in the pelvis; improving T9 lesion; right posterior rib lesion.  April 2020 PSA 6.4; rising.   Clinically stable.  #Proceed with Delton See; continue Xtandi for now-especially as the rash [discussed below] is asymptomatic.  # Rash on bil feet-unclear etiology.  Monitor closely continue traimacinolone.   # Right hip pain- status post radiation; stable.  Continue oxycodone 10 mg every 4 hours.  Also continue fentanyl patch.  Stable.   # Bone mets-on Xgeva monthly.  Calcium normal.  Stable  # CKD- stage III; creatinine 1.2-stable.   # DISPOSITION:  #  X-geva today # Follow up  In 5 weeks-MD- labs-cbc/cmp/psa; X-geva-Dr.B

## 2018-12-30 DIAGNOSIS — Z515 Encounter for palliative care: Secondary | ICD-10-CM | POA: Insufficient documentation

## 2018-12-30 MED ORDER — FENTANYL 50 MCG/HR TD PT72: 1.0000 | MEDICATED_PATCH | TRANSDERMAL | 0 refills | Status: DC

## 2019-01-08 ENCOUNTER — Other Ambulatory Visit: Payer: Self-pay | Admitting: *Deleted

## 2019-01-08 MED ORDER — OXYCODONE HCL 10 MG PO TABS: 10.0000 mg | ORAL_TABLET | Freq: Four times a day (QID) | ORAL | 0 refills | Status: DC | PRN

## 2019-01-22 ENCOUNTER — Other Ambulatory Visit: Payer: Medicare HMO

## 2019-01-26 ENCOUNTER — Telehealth: Payer: Self-pay | Admitting: *Deleted

## 2019-01-26 ENCOUNTER — Other Ambulatory Visit: Payer: Self-pay | Admitting: *Deleted

## 2019-01-26 MED ORDER — OXYCODONE HCL 10 MG PO TABS: 10.0000 mg | ORAL_TABLET | Freq: Four times a day (QID) | ORAL | 0 refills | Status: DC | PRN

## 2019-01-26 MED FILL — XTANDI 40 MG CAPSULE: 40 | 30 days supply | Qty: 120 | Fill #2

## 2019-01-26 NOTE — Telephone Encounter (Signed)
Returned - patient's phone call. Left vm per his request to inform patient that his oxycodone script was sent to his pharmacy today.

## 2019-01-26 NOTE — Telephone Encounter (Signed)
Patient called cancer center requesting refill of oxycodone 10 mg q6 hours for pain.   As mandated by the [place] STOP Act (Strengthen Opioid Misuse Prevention), the [place] Controlled Substance Reporting System ([place]) was reviewed for this patient.  Below is the past 45-months of controlled substance prescriptions as displayed by the registry.  I have personally consulted with my supervising physician, Dr. Rogue Bussing, who agrees that continuation of opiate therapy is medically appropriate at this time and agrees to provide continual monitoring, including urine/blood drug screens, as indicated. Refill is appropriate on or after 01/19/19.  NCCSRS reviewed: Appropriated. Reviewed.     Faythe Casa, NP 01/26/2019 3:49 PM [phone]

## 2019-01-27 ENCOUNTER — Other Ambulatory Visit: Payer: Medicare HMO

## 2019-01-27 ENCOUNTER — Other Ambulatory Visit: Payer: Self-pay | Admitting: Family Medicine

## 2019-01-27 ENCOUNTER — Other Ambulatory Visit: Payer: Self-pay

## 2019-01-27 DIAGNOSIS — C61 Malignant neoplasm of prostate: Secondary | ICD-10-CM

## 2019-01-27 DIAGNOSIS — C7951 Secondary malignant neoplasm of bone: Secondary | ICD-10-CM

## 2019-01-29 LAB — CULTURE, URINE COMPREHENSIVE

## 2019-01-30 ENCOUNTER — Other Ambulatory Visit: Payer: Self-pay

## 2019-01-30 ENCOUNTER — Encounter
Admission: RE | Admit: 2019-01-30 | Discharge: 2019-01-30 | Disposition: A | Discharge status: Home / Self Care | Payer: Medicare HMO | Source: Ambulatory Visit | Attending: Urology | Admitting: Urology

## 2019-01-30 DIAGNOSIS — I444 Left anterior fascicular block: Secondary | ICD-10-CM | POA: Insufficient documentation

## 2019-01-30 DIAGNOSIS — Z01818 Encounter for other preprocedural examination: Secondary | ICD-10-CM | POA: Diagnosis present

## 2019-01-30 DIAGNOSIS — I10 Essential (primary) hypertension: Secondary | ICD-10-CM | POA: Diagnosis not present

## 2019-01-30 DIAGNOSIS — R9431 Abnormal electrocardiogram [ECG] [EKG]: Secondary | ICD-10-CM | POA: Diagnosis not present

## 2019-01-30 DIAGNOSIS — I498 Other specified cardiac arrhythmias: Secondary | ICD-10-CM | POA: Diagnosis not present

## 2019-01-30 DIAGNOSIS — Z1159 Encounter for screening for other viral diseases: Secondary | ICD-10-CM | POA: Insufficient documentation

## 2019-01-30 HISTORY — DX: Gout, unspecified: M10.9

## 2019-01-30 HISTORY — DX: Dorsalgia, unspecified: M54.9

## 2019-01-30 HISTORY — DX: Anorexia: R63.0

## 2019-01-30 NOTE — Patient Instructions (Signed)
Your procedure is scheduled on: 02/03/2019 Tues Report to Same Day Surgery 2nd floor medical mall [facility] Entrance-take elevator on left to 2nd floor.  Check in with surgery information desk.) To find out your arrival time please call [phone] between 1PM - 3PM on 02/02/2019 Mon  Remember: Instructions that are not followed completely may result in serious medical risk, up to and including death, or upon the discretion of your surgeon and anesthesiologist your surgery may need to be rescheduled.    _x___ 1. Do not eat food after midnight the night before your procedure. You may drink clear liquids up to 2 hours before you are scheduled to arrive at the hospital for your procedure.  Do not drink clear liquids within 2 hours of your scheduled arrival to the hospital.  Clear liquids include  --Water or Apple juice without pulp  --Clear carbohydrate beverage such as ClearFast or Gatorade  --Black Coffee or Clear Tea (No milk, no creamers, do not add anything to                  the coffee or Tea Type 1 and type 2 diabetics should only drink water.   ____Ensure clear carbohydrate drink on the way to the hospital for bariatric patients  ____Ensure clear carbohydrate drink 3 hours before surgery for Dr Dwyane Luo patients if physician instructed.   No gum chewing or hard candies.     __x__ 2. No Alcohol for 24 hours before or after surgery.   __x__3. No Smoking or e-cigarettes for 24 prior to surgery.  Do not use any chewable tobacco products for at least 6 hour prior to surgery   ____  4. Bring all medications with you on the day of surgery if instructed.    __x__ 5. Notify your doctor if there is any change in your medical condition     (cold, fever, infections).    x___6. On the morning of surgery brush your teeth with toothpaste and water.  You may rinse your mouth with mouth wash if you wish.  Do not swallow any toothpaste or mouthwash.   Do not wear jewelry, make-up,  hairpins, clips or nail polish.  Do not wear lotions, powders, or perfumes. You may wear deodorant.  Do not shave 48 hours prior to surgery. Men may shave face and neck.  Do not bring valuables to the hospital.    [facility] is not responsible for any belongings or valuables.               Contacts, dentures or bridgework may not be worn into surgery.  Leave your suitcase in the car. After surgery it may be brought to your room.  For patients admitted to the hospital, discharge time is determined by your                       treatment team.  _  Patients discharged the day of surgery will not be allowed to drive home.  You will need someone to drive you home and stay with you the night of your procedure.    Please read over the following fact sheets that you were given:   [facility] Preparing for Surgery and or MRSA Information   _x___ Take anti-hypertensive listed below, cardiac, seizure, asthma,     anti-reflux and psychiatric medicines. These include:  1. allopurinol (ZYLOPRIM  2.carvedilol (COREG) 6.25 MG tablet  3.pantoprazole (PROTONIX) 40 MG tablet  4.  5.  6.  ____Fleets enema or Magnesium Citrate as directed.   ____ Use CHG Soap or sage wipes as directed on instruction sheet   ____ Use inhalers on the day of surgery and bring to hospital day of surgery  _x___ Stop Metformin and Janumet 2 days prior to surgery.    ____ Take 1/2 of usual insulin dose the night before surgery and none on the morning     surgery.   _x___ Follow recommendations from Cardiologist, Pulmonologist or PCP regarding          stopping Aspirin, Coumadin, Plavix ,Eliquis, Effient, or Pradaxa, and Pletal.  X____Stop Anti-inflammatories such as Advil, Aleve, Ibuprofen, Motrin, Naproxen, Naprosyn, Goodies powders or aspirin products. OK to take Tylenol and                          Celebrex.   _x___ Stop supplements until after surgery.  But may continue Vitamin D, Vitamin B,       and  multivitamin.   ____ Bring C-Pap to the hospital.

## 2019-01-31 LAB — NOVEL CORONAVIRUS, NAA (HOSP ORDER, SEND-OUT TO REF LAB; TAT 18-24 HRS): SARS-CoV-2, NAA: NOT DETECTED

## 2019-02-02 ENCOUNTER — Other Ambulatory Visit: Payer: Medicare HMO

## 2019-02-02 ENCOUNTER — Ambulatory Visit: Payer: Medicare HMO

## 2019-02-02 ENCOUNTER — Telehealth: Payer: Self-pay | Admitting: Urology

## 2019-02-02 ENCOUNTER — Encounter: Payer: Self-pay | Admitting: Anesthesiology

## 2019-02-02 ENCOUNTER — Ambulatory Visit: Payer: Medicare HMO | Admitting: Hospice and Palliative Medicine

## 2019-02-02 ENCOUNTER — Ambulatory Visit: Payer: Medicare HMO | Admitting: Internal Medicine

## 2019-02-02 NOTE — Telephone Encounter (Signed)
januruet

## 2019-02-03 ENCOUNTER — Ambulatory Visit: Payer: Medicare HMO | Admitting: Anesthesiology

## 2019-02-03 ENCOUNTER — Ambulatory Visit
Admission: RE | Admit: 2019-02-03 | Discharge: 2019-02-03 | Disposition: A | Discharge status: Home / Self Care | Payer: Medicare HMO | Attending: Urology | Admitting: Urology

## 2019-02-03 ENCOUNTER — Encounter: Payer: Self-pay | Admitting: *Deleted

## 2019-02-03 ENCOUNTER — Ambulatory Visit: Payer: Medicare HMO

## 2019-02-03 ENCOUNTER — Other Ambulatory Visit: Payer: Self-pay

## 2019-02-03 ENCOUNTER — Encounter
Admission: RE | Disposition: A | Discharge status: Home / Self Care | Payer: Self-pay | Source: Home / Self Care | Attending: Urology

## 2019-02-03 DIAGNOSIS — N135 Crossing vessel and stricture of ureter without hydronephrosis: Secondary | ICD-10-CM | POA: Diagnosis not present

## 2019-02-03 DIAGNOSIS — Z192 Hormone resistant malignancy status: Secondary | ICD-10-CM | POA: Diagnosis not present

## 2019-02-03 DIAGNOSIS — M109 Gout, unspecified: Secondary | ICD-10-CM | POA: Insufficient documentation

## 2019-02-03 DIAGNOSIS — Z79899 Other long term (current) drug therapy: Secondary | ICD-10-CM | POA: Insufficient documentation

## 2019-02-03 DIAGNOSIS — E7800 Pure hypercholesterolemia, unspecified: Secondary | ICD-10-CM | POA: Insufficient documentation

## 2019-02-03 DIAGNOSIS — K219 Gastro-esophageal reflux disease without esophagitis: Secondary | ICD-10-CM | POA: Insufficient documentation

## 2019-02-03 DIAGNOSIS — C7951 Secondary malignant neoplasm of bone: Secondary | ICD-10-CM | POA: Insufficient documentation

## 2019-02-03 DIAGNOSIS — Z7984 Long term (current) use of oral hypoglycemic drugs: Secondary | ICD-10-CM | POA: Diagnosis not present

## 2019-02-03 DIAGNOSIS — Z87891 Personal history of nicotine dependence: Secondary | ICD-10-CM | POA: Diagnosis not present

## 2019-02-03 DIAGNOSIS — I4892 Unspecified atrial flutter: Secondary | ICD-10-CM | POA: Diagnosis not present

## 2019-02-03 DIAGNOSIS — E119 Type 2 diabetes mellitus without complications: Secondary | ICD-10-CM | POA: Diagnosis not present

## 2019-02-03 DIAGNOSIS — M1990 Unspecified osteoarthritis, unspecified site: Secondary | ICD-10-CM | POA: Insufficient documentation

## 2019-02-03 DIAGNOSIS — Z7982 Long term (current) use of aspirin: Secondary | ICD-10-CM | POA: Insufficient documentation

## 2019-02-03 DIAGNOSIS — I252 Old myocardial infarction: Secondary | ICD-10-CM | POA: Insufficient documentation

## 2019-02-03 DIAGNOSIS — D649 Anemia, unspecified: Secondary | ICD-10-CM | POA: Insufficient documentation

## 2019-02-03 DIAGNOSIS — I10 Essential (primary) hypertension: Secondary | ICD-10-CM | POA: Diagnosis not present

## 2019-02-03 DIAGNOSIS — N131 Hydronephrosis with ureteral stricture, not elsewhere classified: Secondary | ICD-10-CM | POA: Insufficient documentation

## 2019-02-03 DIAGNOSIS — C61 Malignant neoplasm of prostate: Secondary | ICD-10-CM | POA: Insufficient documentation

## 2019-02-03 HISTORY — PX: CYSTOSCOPY W/ RETROGRADES: SHX1426

## 2019-02-03 HISTORY — PX: CYSTOSCOPY W/ URETERAL STENT PLACEMENT: SHX1429

## 2019-02-03 LAB — GLUCOSE, CAPILLARY
Glucose-Capillary: 158 mg/dL — ABNORMAL HIGH (ref 70–99)
Glucose-Capillary: 147 mg/dL — ABNORMAL HIGH (ref 70–99)

## 2019-02-03 SURGERY — CYSTOSCOPY, FLEXIBLE, WITH STENT REPLACEMENT
Anesthesia: General | Laterality: Left

## 2019-02-03 MED ORDER — ONDANSETRON HCL 4 MG/2ML IJ SOLN: 4.0000 mg | Freq: Once | INTRAMUSCULAR | Status: DC | PRN

## 2019-02-03 MED ORDER — CEFAZOLIN SODIUM-DEXTROSE 2-4 GM/100ML-% IV SOLN
INTRAVENOUS | Status: AC
  Filled 2019-02-03: qty 100

## 2019-02-03 MED ORDER — PHENYLEPHRINE HCL (PRESSORS) 10 MG/ML IV SOLN
INTRAVENOUS | Status: DC | PRN
  Administered 2019-02-03: 100 ug via INTRAVENOUS

## 2019-02-03 MED ORDER — SODIUM CHLORIDE 0.9 % IV SOLN
INTRAVENOUS | Status: DC
  Administered 2019-02-03: 07:00:00 via INTRAVENOUS

## 2019-02-03 MED ORDER — FENTANYL CITRATE (PF) 100 MCG/2ML IJ SOLN: 25.0000 ug | INTRAMUSCULAR | Status: DC | PRN

## 2019-02-03 MED ORDER — CEFAZOLIN SODIUM-DEXTROSE 2-4 GM/100ML-% IV SOLN
2.0000 g | INTRAVENOUS | Status: AC
  Administered 2019-02-03: 2 g via INTRAVENOUS

## 2019-02-03 MED ORDER — LIDOCAINE HCL (CARDIAC) PF 100 MG/5ML IV SOSY
PREFILLED_SYRINGE | INTRAVENOUS | Status: DC | PRN
  Administered 2019-02-03: 40 mg via INTRAVENOUS

## 2019-02-03 MED ORDER — IOPAMIDOL (ISOVUE-200) INJECTION 41%
INTRAVENOUS | Status: DC | PRN
  Administered 2019-02-03: 08:00:00 4 mL

## 2019-02-03 MED ORDER — PROPOFOL 10 MG/ML IV BOLUS
INTRAVENOUS | Status: DC | PRN
  Administered 2019-02-03: 100 mg via INTRAVENOUS

## 2019-02-03 MED ORDER — FENTANYL CITRATE (PF) 100 MCG/2ML IJ SOLN
INTRAMUSCULAR | Status: AC
  Filled 2019-02-03: qty 2

## 2019-02-03 MED ORDER — PROPOFOL 10 MG/ML IV BOLUS
INTRAVENOUS | Status: AC
  Filled 2019-02-03: qty 40

## 2019-02-03 MED ORDER — MIDAZOLAM HCL 2 MG/2ML IJ SOLN
INTRAMUSCULAR | Status: DC | PRN
  Administered 2019-02-03: 1 mg via INTRAVENOUS

## 2019-02-03 MED ORDER — MIDAZOLAM HCL 2 MG/2ML IJ SOLN
INTRAMUSCULAR | Status: AC
  Filled 2019-02-03: qty 2

## 2019-02-03 MED ORDER — GLYCOPYRROLATE 0.2 MG/ML IJ SOLN
INTRAMUSCULAR | Status: DC | PRN
  Administered 2019-02-03: 0.2 mg via INTRAVENOUS

## 2019-02-03 MED ORDER — SEVOFLURANE IN SOLN
RESPIRATORY_TRACT | Status: AC
  Filled 2019-02-03: qty 250

## 2019-02-03 SURGICAL SUPPLY — 21 items
BAG DRAIN CYSTO-URO LG1000N (MISCELLANEOUS) ×3 IMPLANT
BRUSH SCRUB EZ  4% CHG (MISCELLANEOUS) ×2
BRUSH SCRUB EZ 4% CHG (MISCELLANEOUS) ×1 IMPLANT
CATH URETL 5X70 OPEN END (CATHETERS) ×3 IMPLANT
DRAPE UTILITY 15X26 TOWEL STRL (DRAPES) ×3 IMPLANT
GLOVE BIO SURGEON STRL SZ8 (GLOVE) ×3 IMPLANT
GOWN STRL REUS W/ TWL LRG LVL3 (GOWN DISPOSABLE) ×1 IMPLANT
GOWN STRL REUS W/ TWL XL LVL3 (GOWN DISPOSABLE) ×1 IMPLANT
GOWN STRL REUS W/TWL LRG LVL3 (GOWN DISPOSABLE) ×2
GOWN STRL REUS W/TWL XL LVL3 (GOWN DISPOSABLE) ×6 IMPLANT
GUIDEWIRE STR DUAL SENSOR (WIRE) ×3 IMPLANT
KIT TURNOVER CYSTO (KITS) ×3 IMPLANT
PACK CYSTO AR (MISCELLANEOUS) ×3 IMPLANT
SET CYSTO W/LG BORE CLAMP LF (SET/KITS/TRAYS/PACK) ×3 IMPLANT
SOL .9 NS 3000ML IRR  AL (IV SOLUTION) ×2
SOL .9 NS 3000ML IRR UROMATIC (IV SOLUTION) ×1 IMPLANT
STENT URET 6FRX24 CONTOUR (STENTS) IMPLANT
STENT URET 6FRX26 CONTOUR (STENTS) IMPLANT
STENT URO INLAY 6FRX26CM (STENTS) ×2 IMPLANT
SURGILUBE 2OZ TUBE FLIPTOP (MISCELLANEOUS) ×3 IMPLANT
WATER STERILE IRR 1000ML POUR (IV SOLUTION) ×3 IMPLANT

## 2019-02-03 NOTE — Interval H&P Note (Signed)
History and Physical Interval Note:  02/03/2019 7:22 AM  [name]  has presented today for surgery, with the diagnosis of left ureteral obstruction.  The various methods of treatment have been discussed with the patient and family. After consideration of risks, benefits and other options for treatment, the patient has consented to  Procedure(s): CYSTOSCOPY WITH STENT REPLACEMENT (Left) CYSTOSCOPY WITH RETROGRADE PYELOGRAM (Left) as a surgical intervention.  The patient's history has been reviewed, patient examined, no change in status, stable for surgery.  I have reviewed the patient's chart and labs.  Questions were answered to the patient's satisfaction.     [place]

## 2019-02-03 NOTE — Op Note (Signed)
Preoperative diagnosis:  1. Left ureteral obstruction secondary to metastatic prostate cancer  Postoperative diagnosis:  1. Same  Procedure:  1. Cystoscopy 2. Left ureteral stent exchange (Bard Optima 6FR/26 cm)  3. Left retrograde pyelography with interpretation   Surgeon: Nicki Reaper C. Xavi Tomasik, M.D.  Anesthesia: General  Complications: None  Intraoperative findings:  Left retrograde pyelogram with moderate hydronephrosis and mild dilation of the proximal ureter  EBL: None  Specimens: None  Indication: [name] is a 78 y.o. patient with metastatic castrate resistant prostate cancer with bony metastasis and left ureteral obstruction presenting today for exchange of his left ureteral stent.   After reviewing the management options for treatment, he elected to proceed with the above surgical procedure(s). We have discussed the potential benefits and risks of the procedure, side effects of the proposed treatment, the likelihood of the patient achieving the goals of the procedure, and any potential problems that might occur during the procedure or recuperation. Informed consent has been obtained.  Description of procedure:  The patient was taken to the operating room and general anesthesia was induced.  The patient was placed in the dorsal lithotomy position, prepped and draped in the usual sterile fashion, and preoperative antibiotics were administered. A preoperative time-out was performed.   A 21 French cystoscope was lubricated and passed per urethra.  The urethra was normal in caliber without stricture.  The prostate was surgically absent.  Panendoscopy was performed and the bladder mucosa showed no solid or papillary lesions.  Mild erythema of the left hemitrigone secondary to the indwelling stent.  There was no significant encrustation of the indwelling stent noted.  The left ureteral stent was grasped with endoscopic forceps and brought out to the urethral meatus.  A 0.038 sensor  wire was placed through the stent and advanced to the left renal pelvis without problems.  The stent was removed and a 5 [place] open-ended ureteral catheter was advanced over the wire to the region of the renal pelvis.  The guidewire was removed and retrograde pyelogram was performed with findings as described above.  The guidewire was replaced and the ureteral catheter was removed.  The guidewire was backloaded on the cystoscope.  A 6FR/26 cm Bard Optima ureteral stent was placed without difficulty.  Good positioning was noted proximally under fluoroscopy and distally under direct vision.  The bladder was emptied and the cystoscope was removed.  After anesthetic reversal he was transported to the PACU in stable condition.   [name], MD

## 2019-02-03 NOTE — Anesthesia Post-op Follow-up Note (Signed)
Anesthesia QCDR form completed.        

## 2019-02-03 NOTE — H&P (Signed)
02/03/2019 7:10 AM   [name] Mar 07, 1941 [id number]  Referring provider: No referring provider defined for this encounter.  No chief complaint on file.   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 He presents for stent exchange.  PMH: Past Medical History:  Diagnosis Date  . Aortic stenosis, moderate 01/30/2014  . Appetite loss   . Arthritis   . Atrial flutter ([place]) 02/02/2014  . Back pain   . Diabetes mellitus without complication (HCC)    diet control  . GERD (gastroesophageal reflux disease)   . Gout   . Heart murmur   . Heart murmur   . History of kidney stones   . HTN (hypertension) 01/30/2014  . Hypercholesteremia   . Hypertension   . Prostate cancer ([place])    metastatic  . Weakness of left leg     Surgical History: Past Surgical History:  Procedure Laterality Date  . 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 CATARACT EXTRACTION W/ INTRAOCULAR LENS IMPLANT Bilateral   . COLON RESECTION    . CYSTOSCOPY W/ RETROGRADES Left 06/08/2015   Procedure: CYSTOSCOPY WITH RETROGRADE PYELOGRAM;  Surgeon: Nickie Retort, MD;  Location: [facility];  Service: Urology;  Laterality: Left;  . CYSTOSCOPY W/ RETROGRADES Left 05/13/2018   Procedure: CYSTOSCOPY WITH RETROGRADE PYELOGRAM;  Surgeon: Abbie Sons, MD;  Location: [facility];  Service: Urology;  Laterality: Left;  . CYSTOSCOPY W/ URETERAL STENT PLACEMENT Left 10/05/2015   Procedure: CYSTOSCOPY WITH STENT REPLACEMENT;  Surgeon: Nickie Retort, MD;  Location: [facility];  Service: Urology;  Laterality: Left;  . CYSTOSCOPY W/ URETERAL STENT PLACEMENT Left 01/04/2016   Procedure: CYSTOSCOPY WITH STENT REPLACEMENT;  Surgeon: Nickie Retort, MD;  Location: [facility];  Service: Urology;  Laterality: Left;  . CYSTOSCOPY W/ URETERAL STENT PLACEMENT Left 04/11/2016   Procedure: CYSTOSCOPY WITH STENT REPLACEMENT;  Surgeon: Nickie Retort, MD;  Location: [facility];  Service: Urology;  Laterality: Left;  . CYSTOSCOPY W/ URETERAL STENT PLACEMENT Left 07/18/2016   Procedure: CYSTOSCOPY WITH STENT REPLACEMENT;  Surgeon: Nickie Retort, MD;  Location: [facility];  Service: Urology;  Laterality: Left;  . CYSTOSCOPY W/ URETERAL STENT PLACEMENT Left 11/02/2016   Procedure: CYSTOSCOPY WITH STENT REPLACEMENT;  Surgeon: Nickie Retort, MD;  Location: [facility];  Service: Urology;  Laterality: Left;  . CYSTOSCOPY W/ URETERAL STENT PLACEMENT Left 02/08/2017   Procedure: CYSTOSCOPY WITH STENT REPLACEMENT;  Surgeon: Nickie Retort, MD;  Location: [facility];  Service: Urology;  Laterality: Left;  . CYSTOSCOPY W/ URETERAL STENT PLACEMENT Left 05/10/2017   Procedure: CYSTOSCOPY WITH STENT REPLACEMENT;  Surgeon: Nickie Retort, MD;  Location: [facility];  Service: Urology;  Laterality: Left;  . CYSTOSCOPY W/ URETERAL STENT PLACEMENT Left 05/13/2018   Procedure: CYSTOSCOPY WITH STENT Exchange;  Surgeon: Abbie Sons, MD;  Location: [facility];  Service: Urology;  Laterality: Left;  . CYSTOSCOPY WITH STENT PLACEMENT Left 08/16/2017   Procedure: CYSTOSCOPY WITH STENT EXCHANGE;  Surgeon: Abbie Sons, MD;  Location: [facility];  Service: Urology;  Laterality: Left;  . CYSTOSCOPY WITH STENT PLACEMENT Left 12/17/2017   Procedure: CYSTOSCOPY WITH STENT EXCHANGE;  Surgeon: Abbie Sons, MD;  Location: [facility];  Service: Urology;  Laterality: Left;  . EYE SURGERY Bilateral Sept and Oct.2012   cataract  . HERNIA REPAIR    . PROSTATECTOMY  1994    Home Medications:  . allopurinol (ZYLOPRIM) 300 MG tablet Take 300 mg by mouth daily.     Marland Kitchen aspirin 81 MG tablet Take 81 mg by mouth daily.     Marland Kitchen atorvastatin (LIPITOR) 10 MG tablet Take 10  mg by mouth daily with supper.     . carvedilol (COREG) 6.25 MG tablet Take 1 tablet (6.25 mg total) by mouth 2 (two) times daily with a meal. 60 tablet 2  . chlorthalidone (HYGROTON) 25 MG tablet Take 25 mg by mouth daily.     . fentaNYL (DURAGESIC) 50 MCG/HR Place 1 patch onto the skin every 3 (three) days. 10 patch 0  . ferrous sulfate 325 (65 FE) MG tablet Take 325 mg by mouth daily with breakfast.     . hydrochlorothiazide (MICROZIDE) 12.5 MG capsule Take 12.5 mg by mouth daily.    . metFORMIN (GLUCOPHAGE) 500 MG tablet Take 500 mg by mouth daily.     . mirabegron ER (MYRBETRIQ) 25 MG TB24 tablet Take 1 tablet (25 mg total) by mouth daily. 30 tablet 11  . oxybutynin (DITROPAN-XL) 5 MG 24 hr tablet Take 1 tablet (5 mg total) by mouth 3 (three) times daily. 90 tablet 11  . Oxycodone HCl 10 MG TABS Take 1 tablet (10 mg total) by mouth every 6 (six) hours as needed (pain). 45 tablet 0  . pantoprazole (PROTONIX) 40 MG tablet Take 1 tablet (40 mg total) by mouth daily before breakfast. 30 tablet 2  . ranitidine (ZANTAC) 150 MG tablet Take 150 mg by mouth at bedtime.     . triamcinolone (KENALOG) 0.025 % ointment Apply 1 application topically 2 (two) times daily. Apply to clean, dry feet as directed 80 g 0  . vitamin B-12 (CYANOCOBALAMIN) 1000 MCG tablet Take 1,000 mcg by mouth daily.    . vitamin C (ASCORBIC ACID) 500 MG tablet Take 500 mg by mouth daily.    . Vitamin D, Ergocalciferol, (DRISDOL) 1.25 MG (50000 UT) CAPS capsule Take 5,000 Units by mouth once a week.     . diazepam (VALIUM) 5 MG tablet One pill 45-60 mins prior to procedure; 1 pill 15 mins prior if needed. (Patient not taking: Reported on 10/23/2018) 2 tablet 0  . naloxone (NARCAN) nasal spray 4 mg/0.1 mL For use with opioid overdose. 1 spray delivered by intranasal administration. Call 911 (EMS) if used. (Patient not taking: Reported on 12/25/2018) 1 kit 0  . XTANDI 40 MG capsule TAKE 4 CAPSULES (160 MG TOTAL) BY MOUTH  DAILY. (Patient not taking: Reported on 12/29/2018)      Allergies: No Known Allergies  Family History: Family History  Problem Relation Age of Onset  . Aneurysm Father   . Cancer Mother        breast    Social History:  reports that he quit smoking about 45 years ago. His smoking use included cigarettes. He has a 37.50 pack-year smoking history. He has never used smokeless tobacco. He reports that he does not drink alcohol or use drugs.  ROS: Constitutional: Positive for malaise/fatigue. Negative for chills, diaphoresis, fever and weight loss.  HENT: Negative for nosebleeds and sore throat.   Eyes: Negative for double vision.  Respiratory: Negative for cough, hemoptysis, sputum production, shortness of breath and wheezing.   Cardiovascular: Negative for chest pain, palpitations and orthopnea.  Gastrointestinal:  Negative for abdominal pain, blood in stool, constipation, diarrhea, heartburn, melena, nausea and vomiting.  Genitourinary: Negative for dysuria, frequency and urgency.  Musculoskeletal: Positive for back pain and joint pain.  Skin: Positive for rash. Negative for itching.  Neurological: Negative for dizziness, tingling, focal weakness, weakness and headaches.  Endo/Heme/Allergies: Does not bruise/bleed easily.  Psychiatric/Behavioral: Negative for depression. The patient is not nervous/anxious and does not have insomnia.    Physical Exam: BP 140/82   Pulse 74   Temp 97.8 F (36.6 C) (Oral)   Resp 18   Ht 5' 10.5" (1.791 m)   Wt 70.3 kg   SpO2 100%   BMI 21.93 kg/m   Constitutional:  Alert and oriented, No acute distress. HEENT: [place] AT, moist mucus membranes.  Trachea midline, no masses. Cardiovascular: No clubbing, cyanosis, or edema. RRR Respiratory: Normal respiratory effort, no increased work of breathing. Clear GI: Abdomen is soft, nontender, nondistended, no abdominal masses GU: No CVA tenderness Lymph: No cervical or inguinal lymphadenopathy. Skin: No  rashes, bruises or suspicious lesions. Neurologic: Grossly intact, no focal deficits, moving all 4 extremities. Psychiatric: Normal mood and affect.  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  The procedure has been discussed in detail including potential risks of bleeding and infection.  He desires to proceed.  Abbie Sons, [place] [address], [place], [place] [address] [phone]

## 2019-02-03 NOTE — Anesthesia Procedure Notes (Signed)
Procedure Name: LMA Insertion Date/Time: 02/03/2019 7:43 AM Performed by: Gentry Fitz, CRNA Pre-anesthesia Checklist: Patient identified, Emergency Drugs available, Suction available and Patient being monitored Patient Re-evaluated:Patient Re-evaluated prior to induction Oxygen Delivery Method: Circle system utilized Preoxygenation: Pre-oxygenation with 100% oxygen Induction Type: IV induction Ventilation: Mask ventilation without difficulty LMA: LMA inserted LMA Size: 5.0 Tube secured with: Tape Dental Injury: Teeth and Oropharynx as per pre-operative assessment

## 2019-02-03 NOTE — Anesthesia Preprocedure Evaluation (Addendum)
Anesthesia Evaluation  Patient identified by MRN, date of birth, ID band Patient awake    Reviewed: Allergy & Precautions, NPO status , Patient's Chart, lab work & pertinent test results, reviewed documented beta blocker date and time   Airway Mallampati: II  TM Distance: >3 FB     Dental  (+) Upper Dentures, Lower Dentures   Pulmonary pneumonia, resolved, former smoker,           Cardiovascular hypertension, Pt. on medications and Pt. on home beta blockers + Past MI  + Valvular Problems/Murmurs      Neuro/Psych    GI/Hepatic GERD  Controlled,  Endo/Other  diabetes, Type 2  Renal/GU Renal disease     Musculoskeletal  (+) Arthritis ,   Abdominal   Peds  Hematology  (+) anemia ,   Anesthesia Other Findings   Reproductive/Obstetrics                            Anesthesia Physical Anesthesia Plan  ASA: III  Anesthesia Plan: General   Post-op Pain Management:    Induction: Intravenous  PONV Risk Score and Plan:   Airway Management Planned: LMA  Additional Equipment:   Intra-op Plan:   Post-operative Plan:   Informed Consent: I have reviewed the patients History and Physical, chart, labs and discussed the procedure including the risks, benefits and alternatives for the proposed anesthesia with the patient or authorized representative who has indicated his/her understanding and acceptance.       Plan Discussed with: CRNA  Anesthesia Plan Comments:         Anesthesia Quick Evaluation

## 2019-02-03 NOTE — Discharge Instructions (Signed)
AMBULATORY SURGERY  DISCHARGE INSTRUCTIONS   1) The drugs that you were given will stay in your system until tomorrow so for the next 24 hours you should not:  A) Drive an automobile B) Make any legal decisions C) Drink any alcoholic beverage   2) You may resume regular meals tomorrow.  Today it is better to start with liquids and gradually work up to solid foods.  You may eat anything you prefer, but it is better to start with liquids, then soup and crackers, and gradually work up to solid foods.   3) Please notify your doctor immediately if you have any unusual bleeding, trouble breathing, redness and pain at the surgery site, drainage, fever, or pain not relieved by medication.    4) Additional Instructions:        Please contact your physician with any problems or Same Day Surgery at [phone], Monday through Friday 6 am to 4 pm, or [place] at [facility] number at [phone].DISCHARGE INSTRUCTIONS FOR KIDNEY STONE/URETERAL STENT   MEDICATIONS:  1. Resume all your other meds from home.   ACTIVITY:  1. May resume regular activities in 24 hours. 2. No driving while on narcotic pain medications  3. Drink plenty of water  4. Continue to walk at home - you can still get blood clots when you are at home, so keep active, but don't over do it.  5. May return to work/school tomorrow or when you feel ready   BATHING:  1. You can shower.   SIGNS/SYMPTOMS TO CALL:  Please call us if you have a fever greater than 101.5, uncontrolled nausea/vomiting, uncontrolled pain, dizziness, unable to urinate, bloody urine, chest pain, shortness of breath, leg swelling, leg pain, or any other concerns or questions.   You can reach [place] at [phone].   FOLLOW-UP:  1. You will be contacted regarding a follow-up appointment which will be scheduled in approximately 4 months.

## 2019-02-03 NOTE — Anesthesia Postprocedure Evaluation (Signed)
Anesthesia Post Note  Patient: [name]  Procedure(s) Performed: CYSTOSCOPY WITH STENT REPLACEMENT (Left ) CYSTOSCOPY WITH RETROGRADE PYELOGRAM (Left )  Patient location during evaluation: PACU Anesthesia Type: General Level of consciousness: awake and alert Pain management: pain level controlled Vital Signs Assessment: post-procedure vital signs reviewed and stable Respiratory status: spontaneous breathing, nonlabored ventilation, respiratory function stable and patient connected to nasal cannula oxygen Cardiovascular status: blood pressure returned to baseline and stable Postop Assessment: no apparent nausea or vomiting Anesthetic complications: no     Last Vitals:  Vitals:   02/03/19 0849 02/03/19 0854  BP:  (!) 148/91  Pulse: 75 85  Resp: 15 18  Temp:  36.6 C  SpO2: 100% 100%    Last Pain:  Vitals:   02/03/19 0854  TempSrc: Temporal  PainSc: 0-No pain                 [place]

## 2019-02-03 NOTE — Transfer of Care (Signed)
Immediate Anesthesia Transfer of Care Note  Patient: [name]  Procedure(s) Performed: CYSTOSCOPY WITH STENT REPLACEMENT (Left ) CYSTOSCOPY WITH RETROGRADE PYELOGRAM (Left )  Patient Location: PACU  Anesthesia Type:General  Level of Consciousness: drowsy  Airway & Oxygen Therapy: Patient Spontanous Breathing and Patient connected to face mask oxygen  Post-op Assessment: Report given to RN and Post -op Vital signs reviewed and stable  Post vital signs: Reviewed and stable  Last Vitals:  Vitals Value Taken Time  BP 105/72 02/03/19 0819  Temp    Pulse 85 02/03/19 0822  Resp 10 02/03/19 0822  SpO2 100 % 02/03/19 0822  Vitals shown include unvalidated device data.  Last Pain:  Vitals:   02/03/19 0646  TempSrc: Oral  PainSc: 10-Worst pain ever         Complications: No apparent anesthesia complications

## 2019-02-10 ENCOUNTER — Telehealth: Payer: Self-pay | Admitting: *Deleted

## 2019-02-10 DIAGNOSIS — R112 Nausea with vomiting, unspecified: Secondary | ICD-10-CM

## 2019-02-10 DIAGNOSIS — C61 Malignant neoplasm of prostate: Secondary | ICD-10-CM

## 2019-02-10 MED ORDER — ONDANSETRON HCL 8 MG PO TABS: 8.0000 mg | ORAL_TABLET | Freq: Three times a day (TID) | ORAL | 3 refills | Status: DC | PRN

## 2019-02-10 NOTE — Telephone Encounter (Signed)
Per v/o Dr. Rogue Bussing - sent in script for zofran 8 mg 1 tablet every 8 hours for nausea.

## 2019-02-10 NOTE — Telephone Encounter (Signed)
Patient informed of prescription sent to pharmacy 

## 2019-02-10 NOTE — Telephone Encounter (Signed)
P called requesting medicine for nausea be sent to pharmacy stating that he vomits every time he eats. Please advise

## 2019-02-13 ENCOUNTER — Other Ambulatory Visit: Payer: Self-pay | Admitting: Oncology

## 2019-02-13 ENCOUNTER — Other Ambulatory Visit: Payer: Self-pay | Admitting: Internal Medicine

## 2019-02-18 ENCOUNTER — Inpatient Hospital Stay (HOSPITAL_BASED_OUTPATIENT_CLINIC_OR_DEPARTMENT_OTHER): Payer: Medicare HMO | Admitting: Internal Medicine

## 2019-02-18 ENCOUNTER — Inpatient Hospital Stay (HOSPITAL_BASED_OUTPATIENT_CLINIC_OR_DEPARTMENT_OTHER): Payer: Medicare HMO | Admitting: Hospice and Palliative Medicine

## 2019-02-18 ENCOUNTER — Inpatient Hospital Stay: Payer: Medicare HMO | Attending: Internal Medicine

## 2019-02-18 ENCOUNTER — Other Ambulatory Visit: Payer: Self-pay | Admitting: *Deleted

## 2019-02-18 ENCOUNTER — Inpatient Hospital Stay: Payer: Medicare HMO

## 2019-02-18 ENCOUNTER — Other Ambulatory Visit: Payer: Self-pay

## 2019-02-18 DIAGNOSIS — I129 Hypertensive chronic kidney disease with stage 1 through stage 4 chronic kidney disease, or unspecified chronic kidney disease: Secondary | ICD-10-CM

## 2019-02-18 DIAGNOSIS — R630 Anorexia: Secondary | ICD-10-CM

## 2019-02-18 DIAGNOSIS — C61 Malignant neoplasm of prostate: Secondary | ICD-10-CM

## 2019-02-18 DIAGNOSIS — M129 Arthropathy, unspecified: Secondary | ICD-10-CM | POA: Diagnosis not present

## 2019-02-18 DIAGNOSIS — M25551 Pain in right hip: Secondary | ICD-10-CM | POA: Diagnosis not present

## 2019-02-18 DIAGNOSIS — Z192 Hormone resistant malignancy status: Secondary | ICD-10-CM

## 2019-02-18 DIAGNOSIS — R634 Abnormal weight loss: Secondary | ICD-10-CM

## 2019-02-18 DIAGNOSIS — Z87891 Personal history of nicotine dependence: Secondary | ICD-10-CM

## 2019-02-18 DIAGNOSIS — Z923 Personal history of irradiation: Secondary | ICD-10-CM | POA: Insufficient documentation

## 2019-02-18 DIAGNOSIS — E1122 Type 2 diabetes mellitus with diabetic chronic kidney disease: Secondary | ICD-10-CM

## 2019-02-18 DIAGNOSIS — I4892 Unspecified atrial flutter: Secondary | ICD-10-CM | POA: Diagnosis not present

## 2019-02-18 DIAGNOSIS — C7951 Secondary malignant neoplasm of bone: Secondary | ICD-10-CM

## 2019-02-18 DIAGNOSIS — I350 Nonrheumatic aortic (valve) stenosis: Secondary | ICD-10-CM

## 2019-02-18 DIAGNOSIS — E7800 Pure hypercholesterolemia, unspecified: Secondary | ICD-10-CM

## 2019-02-18 DIAGNOSIS — N183 Chronic kidney disease, stage 3 (moderate): Secondary | ICD-10-CM

## 2019-02-18 DIAGNOSIS — Z66 Do not resuscitate: Secondary | ICD-10-CM

## 2019-02-18 DIAGNOSIS — Z7984 Long term (current) use of oral hypoglycemic drugs: Secondary | ICD-10-CM | POA: Insufficient documentation

## 2019-02-18 DIAGNOSIS — K219 Gastro-esophageal reflux disease without esophagitis: Secondary | ICD-10-CM | POA: Diagnosis not present

## 2019-02-18 DIAGNOSIS — R112 Nausea with vomiting, unspecified: Secondary | ICD-10-CM

## 2019-02-18 DIAGNOSIS — N1330 Unspecified hydronephrosis: Secondary | ICD-10-CM | POA: Diagnosis not present

## 2019-02-18 DIAGNOSIS — Z7982 Long term (current) use of aspirin: Secondary | ICD-10-CM

## 2019-02-18 DIAGNOSIS — Z79899 Other long term (current) drug therapy: Secondary | ICD-10-CM | POA: Insufficient documentation

## 2019-02-18 DIAGNOSIS — Z515 Encounter for palliative care: Secondary | ICD-10-CM | POA: Insufficient documentation

## 2019-02-18 DIAGNOSIS — R110 Nausea: Secondary | ICD-10-CM

## 2019-02-18 LAB — CBC WITH DIFFERENTIAL/PLATELET
Abs Immature Granulocytes: 0.03 10*3/uL (ref 0.00–0.07)
Basophils Absolute: 0.1 10*3/uL (ref 0.0–0.1)
Basophils Relative: 1 %
Eosinophils Absolute: 0.2 10*3/uL (ref 0.0–0.5)
Eosinophils Relative: 3 %
HCT: 33.6 % — ABNORMAL LOW (ref 39.0–52.0)
Hemoglobin: 11.4 g/dL — ABNORMAL LOW (ref 13.0–17.0)
Immature Granulocytes: 1 %
Lymphocytes Relative: 12 %
Lymphs Abs: 0.8 10*3/uL (ref 0.7–4.0)
MCH: 31.3 pg (ref 26.0–34.0)
MCHC: 33.9 g/dL (ref 30.0–36.0)
MCV: 92.3 fL (ref 80.0–100.0)
Monocytes Absolute: 0.5 10*3/uL (ref 0.1–1.0)
Monocytes Relative: 7 %
Neutro Abs: 4.9 10*3/uL (ref 1.7–7.7)
Neutrophils Relative %: 76 %
Platelets: 167 10*3/uL (ref 150–400)
RBC: 3.64 MIL/uL — ABNORMAL LOW (ref 4.22–5.81)
RDW: 15.8 % — ABNORMAL HIGH (ref 11.5–15.5)
WBC: 6.5 10*3/uL (ref 4.0–10.5)
nRBC: 0 % (ref 0.0–0.2)

## 2019-02-18 LAB — COMPREHENSIVE METABOLIC PANEL
ALT: 10 U/L (ref 0–44)
AST: 21 U/L (ref 15–41)
Albumin: 3.8 g/dL (ref 3.5–5.0)
Alkaline Phosphatase: 71 U/L (ref 38–126)
Anion gap: 15 (ref 5–15)
BUN: 45 mg/dL — ABNORMAL HIGH (ref 8–23)
CO2: 21 mmol/L — ABNORMAL LOW (ref 22–32)
Calcium: 10.1 mg/dL (ref 8.9–10.3)
Chloride: 102 mmol/L (ref 98–111)
Creatinine, Ser: 1.28 mg/dL — ABNORMAL HIGH (ref 0.61–1.24)
GFR calc Af Amer: >60 mL/min (ref >60)
GFR calc non Af Amer: 54 mL/min — ABNORMAL LOW (ref >60)
Glucose, Bld: 165 mg/dL — ABNORMAL HIGH (ref 70–99)
Potassium: 3.6 mmol/L (ref 3.5–5.1)
Sodium: 138 mmol/L (ref 135–145)
Total Bilirubin: 0.7 mg/dL (ref 0.3–1.2)
Total Protein: 7.3 g/dL (ref 6.5–8.1)

## 2019-02-18 LAB — PSA: Prostatic Specific Antigen: 5.66 ng/mL — ABNORMAL HIGH (ref 0.00–4.00)

## 2019-02-18 MED ORDER — ONDANSETRON HCL 8 MG PO TABS: 8.0000 mg | ORAL_TABLET | Freq: Three times a day (TID) | ORAL | 3 refills | Status: AC | PRN

## 2019-02-18 MED ORDER — DENOSUMAB 120 MG/1.7ML ~~LOC~~ SOLN
120.0000 mg | Freq: Once | SUBCUTANEOUS | Status: AC
  Administered 2019-02-18: 11:00:00 120 mg via SUBCUTANEOUS
  Filled 2019-02-18: qty 1.7

## 2019-02-18 NOTE — Progress Notes (Signed)
[place]  [phone] [phone]   Name: [name] Date: 02/18/2019 MRN: [id number]  DOB: 1941/01/18  Patient Care Team: Tamsen Roers, MD as PCP - General (Family Medicine) Bernardo Heater, Ronda Fairly, MD as Consulting Physician (Urology)    REASON FOR CONSULTATION: Palliative Care consult requested for this 78 y.o. 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  PMH also notable for hydronephrosis status post stenting, moderate aortic stenosis, hypertension, diabetes.  He was referred to palliative care for help with symptom management and to address goals.  SOCIAL HISTORY:    Patient is married and lives at home with his wife.  He has several adult children.  He is a retired Patent examiner for [employer].  ADVANCE DIRECTIVES:  Not on file  CODE STATUS: DNR  PAST MEDICAL HISTORY: Past Medical History:  Diagnosis Date  . Aortic stenosis, moderate 01/30/2014  . Appetite loss   . Arthritis   . Atrial flutter ([place]) 02/02/2014  . Back pain   . Diabetes mellitus without complication (HCC)    diet control  . GERD (gastroesophageal reflux disease)   . Gout   . Heart murmur   . Heart murmur   . History of kidney stones   . HTN (hypertension) 01/30/2014  . Hypercholesteremia   . Hypertension   . Prostate cancer ([place])    metastatic  . Weakness of left leg     PAST SURGICAL HISTORY:  Past Surgical History:  Procedure Laterality Date  . 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 CATARACT EXTRACTION W/ INTRAOCULAR LENS IMPLANT Bilateral   . COLON RESECTION    . CYSTOSCOPY W/ RETROGRADES Left 06/08/2015   Procedure: CYSTOSCOPY WITH RETROGRADE PYELOGRAM;  Surgeon: Nickie Retort, MD;  Location: [facility];   Service: Urology;  Laterality: Left;  . CYSTOSCOPY W/ RETROGRADES Left 05/13/2018   Procedure: CYSTOSCOPY WITH RETROGRADE PYELOGRAM;  Surgeon: Abbie Sons, MD;  Location: [facility];  Service: Urology;  Laterality: Left;  . CYSTOSCOPY W/ RETROGRADES Left 02/03/2019   Procedure: CYSTOSCOPY WITH RETROGRADE PYELOGRAM;  Surgeon: Abbie Sons, MD;  Location: [facility];  Service: Urology;  Laterality: Left;  . CYSTOSCOPY W/ URETERAL STENT PLACEMENT Left 10/05/2015   Procedure: CYSTOSCOPY WITH STENT REPLACEMENT;  Surgeon: Nickie Retort, MD;  Location: [facility];  Service: Urology;  Laterality: Left;  . CYSTOSCOPY W/ URETERAL STENT PLACEMENT Left 01/04/2016   Procedure: CYSTOSCOPY WITH STENT REPLACEMENT;  Surgeon: Nickie Retort, MD;  Location: [facility];  Service: Urology;  Laterality: Left;  . CYSTOSCOPY W/ URETERAL STENT PLACEMENT Left 04/11/2016   Procedure: CYSTOSCOPY WITH STENT REPLACEMENT;  Surgeon: Nickie Retort, MD;  Location: [facility];  Service: Urology;  Laterality: Left;  . CYSTOSCOPY W/ URETERAL STENT PLACEMENT Left 07/18/2016   Procedure: CYSTOSCOPY WITH STENT REPLACEMENT;  Surgeon: Nickie Retort, MD;  Location: [facility];  Service: Urology;  Laterality: Left;  . CYSTOSCOPY W/ URETERAL STENT PLACEMENT Left 11/02/2016   Procedure: CYSTOSCOPY WITH STENT REPLACEMENT;  Surgeon: Nickie Retort, MD;  Location: [facility];  Service: Urology;  Laterality: Left;  . CYSTOSCOPY W/ URETERAL STENT PLACEMENT Left 02/08/2017   Procedure: CYSTOSCOPY WITH STENT REPLACEMENT;  Surgeon: Nickie Retort, MD;  Location: [facility];  Service: Urology;  Laterality: Left;  . CYSTOSCOPY W/ URETERAL STENT PLACEMENT Left 05/10/2017   Procedure: CYSTOSCOPY WITH STENT REPLACEMENT;  Surgeon: Nickie Retort, MD;  Location: [facility];  Service: Urology;  Laterality: Left;  . CYSTOSCOPY W/ URETERAL STENT PLACEMENT Left 05/13/2018   Procedure: CYSTOSCOPY WITH STENT Exchange;  Surgeon: Abbie Sons,  MD;  Location: [facility];  Service: Urology;  Laterality: Left;  . CYSTOSCOPY W/ URETERAL STENT PLACEMENT Left 02/03/2019   Procedure: CYSTOSCOPY WITH STENT REPLACEMENT;  Surgeon: Abbie Sons, MD;  Location: [facility];  Service: Urology;  Laterality: Left;  . CYSTOSCOPY WITH STENT PLACEMENT Left 08/16/2017   Procedure: CYSTOSCOPY WITH STENT EXCHANGE;  Surgeon: Abbie Sons, MD;  Location: [facility];  Service: Urology;  Laterality: Left;  . CYSTOSCOPY WITH STENT PLACEMENT Left 12/17/2017   Procedure: CYSTOSCOPY WITH STENT EXCHANGE;  Surgeon: Abbie Sons, MD;  Location: [facility];  Service: Urology;  Laterality: Left;  . EYE SURGERY Bilateral Sept and Oct.2012   cataract  . HERNIA REPAIR    . PROSTATECTOMY  1994    HEMATOLOGY/ONCOLOGY HISTORY:  Oncology History Overview Note  # AUG 2016- METASTATIC PROSTATE CA/ STAGE IV; Castrate sensitive [PSA- 48] ; mets- lumbar spine [s/p pal RT to Aug 2016; Dr.Crystal] /Pelvic LN; 1994- Prostate CA s/p Surgery [facility]]; Lupron q4 M; MARCH 2017- Bone scan- L4/Right pubic rami uptake; PSA- 0.1/testosterone-castrate [<3]  # Bone lesions on denosumab;DEC 2016- Recm q 17M  # NOV 2018- CASTRATE RESISTANT; declined chemo; 08/06/2017- start Zytiga+prednisone; Stopped April 24th [rising PSA];  # may 2019- Xofigo #6 on 10/16]  # OCT 16th 2019- X-tandi  # Left hydronephrosis s/p stenting [Dr.Budzyn] --------------------------------------------   DIAGNOSIS: [ ]  castrate resistant prostate cancer  STAGE:  IV       ;GOALS: palliative  CURRENT/MOST RECENT THERAPY ; Xtandi   Prostate cancer metastatic to bone [facility])    ALLERGIES:  has No Known Allergies.  MEDICATIONS:  Current Outpatient Medications  Medication Sig Dispense Refill  . allopurinol (ZYLOPRIM) 300 MG tablet Take 300 mg by mouth daily.     Marland Kitchen aspirin 81 MG tablet Take 81 mg by mouth daily.     Marland Kitchen atorvastatin (LIPITOR) 10 MG tablet Take 10 mg by mouth daily with supper.     .  carvedilol (COREG) 6.25 MG tablet Take 1 tablet (6.25 mg total) by mouth 2 (two) times daily with a meal. 60 tablet 2  . chlorthalidone (HYGROTON) 25 MG tablet Take 25 mg by mouth daily.     . fentaNYL (DURAGESIC) 50 MCG/HR Place 1 patch onto the skin every 3 (three) days. 10 patch 0  . ferrous sulfate 325 (65 FE) MG tablet Take 325 mg by mouth daily with breakfast.     . hydrochlorothiazide (MICROZIDE) 12.5 MG capsule Take 12.5 mg by mouth daily.    . metFORMIN (GLUCOPHAGE) 500 MG tablet Take 500 mg by mouth daily.     . mirabegron ER (MYRBETRIQ) 25 MG TB24 tablet Take 1 tablet (25 mg total) by mouth daily. 30 tablet 11  . naloxone (NARCAN) nasal spray 4 mg/0.1 mL For use with opioid overdose. 1 spray delivered by intranasal administration. Call 911 (EMS) if used. (Patient not taking: Reported on 12/25/2018) 1 kit 0  . ondansetron (ZOFRAN) 8 MG tablet Take 1 tablet (8 mg total) by mouth every 8 (eight) hours as needed for nausea or vomiting. 20 tablet 3  . oxybutynin (DITROPAN-XL) 5 MG 24 hr tablet Take 1 tablet (5  mg total) by mouth 3 (three) times daily. (Patient taking differently: Take 5 mg by mouth at bedtime. ) 90 tablet 11  . Oxycodone HCl 10 MG TABS TAKE 1 TABLET BY MOUTH EVERY 6 HOURS AS NEEDED FOR PAIN 60 tablet 0  . pantoprazole (PROTONIX) 40 MG tablet Take 1 tablet (40 mg total) by mouth daily before breakfast. 30 tablet 2  . triamcinolone (KENALOG) 0.025 % ointment Apply 1 application topically 2 (two) times daily. Apply to clean, dry feet as directed 80 g 0  . vitamin B-12 (CYANOCOBALAMIN) 1000 MCG tablet Take 1,000 mcg by mouth daily.    . vitamin C (ASCORBIC ACID) 500 MG tablet Take 500 mg by mouth daily.    . Vitamin D, Ergocalciferol, (DRISDOL) 1.25 MG (50000 UT) CAPS capsule Take 5,000 Units by mouth once a week.     Gillermina Phy 40 MG capsule TAKE 4 CAPSULES (160 MG TOTAL) BY MOUTH DAILY. 120 capsule 4   No current facility-administered medications for this visit.     VITAL SIGNS:  There were no vitals taken for this visit. There were no vitals filed for this visit.  Estimated body mass index is 22.21 kg/m as calculated from the following:   Height as of an earlier encounter on 02/18/19: 5' 10.5" (1.791 m).   Weight as of an earlier encounter on 02/18/19: 157 lb (71.2 kg).  LABS: CBC:    Component Value Date/Time   WBC 6.5 02/18/2019 0944   HGB 11.4 (L) 02/18/2019 0944   HCT 33.6 (L) 02/18/2019 0944   PLT 167 02/18/2019 0944   MCV 92.3 02/18/2019 0944   NEUTROABS 4.9 02/18/2019 0944   LYMPHSABS 0.8 02/18/2019 0944   MONOABS 0.5 02/18/2019 0944   EOSABS 0.2 02/18/2019 0944   BASOSABS 0.1 02/18/2019 0944   Comprehensive Metabolic Panel:    Component Value Date/Time   NA 138 02/18/2019 0944   K 3.6 02/18/2019 0944   CL 102 02/18/2019 0944   CO2 21 (L) 02/18/2019 0944   BUN 45 (H) 02/18/2019 0944   CREATININE 1.28 (H) 02/18/2019 0944   GLUCOSE 165 (H) 02/18/2019 0944   CALCIUM 10.1 02/18/2019 0944   AST 21 02/18/2019 0944   ALT 10 02/18/2019 0944   ALKPHOS 71 02/18/2019 0944   BILITOT 0.7 02/18/2019 0944   PROT 7.3 02/18/2019 0944   ALBUMIN 3.8 02/18/2019 0944    RADIOGRAPHIC STUDIES: Dg C-arm 1-60 Min-no Report  Result Date: 02/03/2019 Fluoroscopy was utilized by the requesting physician.  No radiographic interpretation.    PERFORMANCE STATUS (ECOG) : 3 - Symptomatic, >50% confined to bed  Review of Systems As noted above. Otherwise, a complete review of systems is negative.  Physical Exam General: NAD, frail appearing, thin Cardiovascular: regular rate and rhythm Pulmonary: clear ant fields Abdomen: soft, nontender, + bowel sounds GU: no suprapubic tenderness Extremities: no edema, no joint deformities Skin: no rashes Neurological: Weakness but otherwise nonfocal  IMPRESSION: Follow-up visit today in the clinic.  Patient says that his pain is stable on current opioid regimen with transdermal fentanyl and PRN oxycodone.  However, he has  had persistent nausea and occasional vomiting.  Nausea is occurring daily and is limiting his oral intake.  Patient has no fever or chills.  No diarrhea or constipation.  No abdominal pain.  Etiology of nausea is unclear.  Dr. Rogue Bussing saw him today and recommended MRI of the brain to rule out brain metastasis but patient was reluctant.  He is going to see Dr.  Brahmanday again in 2 weeks to reevaluate need for imaging.  Patient is taking Zofran once or twice a day and finds that it significantly helps but nausea recurs midday.  We discussed patient taking Zofran 3 times daily on a scheduled basis.  We also talked about alternative antiemetics but patient feels that Zofran is helping.  Patient has had significant weight loss over the past 2-3 months.  Weight was 179 pounds in April and is now down to 157 pounds.  Will refer to RD.  Discussed importance of high-protein and high-calorie meals, with smaller portions more frequently throughout the day.  Also encouraged increasing oral supplements to 3 times daily if tolerated.   PLAN: -Continue current scope of treatment -Continue transdermal fentanyl 50 mcg every 72 hours and oxycodone 10 mg every 8 hours PRN for breakthrough pain -Increase Zofran to 3 times daily (refill sent) -Increase oral supplements 3 times daily -Referral to RD -RTC in 1 month or sooner if needed   Patient expressed understanding and was in agreement with this plan. He also understands that He can call clinic at any time with any questions, concerns, or complaints.     Time Total: 15 minutes  Visit consisted of counseling and education dealing with the complex and emotionally intense issues of symptom management and palliative care in the setting of serious and potentially life-threatening illness.Greater than 50%  of this time was spent counseling and coordinating care related to the above assessment and plan.  Signed by: Altha Harm, PhD, NP-C [phone] (Work  Cell)

## 2019-02-18 NOTE — Progress Notes (Signed)
18 LB WEIGHT LOSS IN THE LAST 2 MONTHS. Poor meal intake. Eats bites and bits and only 1 time a day with a small snack.  patient reports intermittent nausea. Using zofran twice a day- am and at bedtime.

## 2019-02-18 NOTE — Patient Instructions (Signed)
#   STOP iron pill

## 2019-02-18 NOTE — Progress Notes (Signed)
[facility] OFFICE PROGRESS NOTE  Patient Care Team: Tamsen Roers, MD as PCP - General (Family Medicine) Abbie Sons, MD as Consulting Physician (Urology)  Cancer Staging No matching staging information was found for the patient.   Oncology History Overview Note  # AUG 2016- METASTATIC PROSTATE CA/ STAGE IV; Castrate sensitive [PSA- 48] ; mets- lumbar spine [s/p pal RT to Aug 2016; Dr.Crystal] /Pelvic LN; 1994- Prostate CA s/p Surgery [facility]]; Lupron q4 M; MARCH 2017- Bone scan- L4/Right pubic rami uptake; PSA- 0.1/testosterone-castrate [<3]  # Bone lesions on denosumab;DEC 2016- Recm q 60M  # NOV 2018- CASTRATE RESISTANT; declined chemo; 08/06/2017- start Zytiga+prednisone; Stopped April 24th [rising PSA];  # may 2019- Xofigo #6 on 10/16]  # OCT 16th 2019- X-tandi  # Left hydronephrosis s/p stenting [Dr.Budzyn] --------------------------------------------   DIAGNOSIS: _0  castrate resistant prostate cancer  STAGE:  IV       ;GOALS: palliative  CURRENT/MOST RECENT THERAPY ; Xtandi   Prostate cancer metastatic to bone [facility])      INTERVAL HISTORY:  [name] 78 y.o.  male pleasant patient above history of castrate resistant prostate cancer is here for follow-up/patient is currently [place].  Patient complains of chronic nausea poor appetite.  Nausea worse towards the morning.  Is able to eat supper better than during the day.  Lost about 18 pounds in the last 6 weeks.  Denies any headaches.  Denies any obvious vomiting.  Continues to chronic hip pain.  Overall he feels poorly.  In the interim patient had his stents exchanged.  Review of Systems  Constitutional: Positive for malaise/fatigue and weight loss. Negative for chills, diaphoresis and fever.  HENT: Negative for nosebleeds and sore throat.   Eyes: Negative for double vision.  Respiratory: Negative for cough, hemoptysis, sputum production, shortness of breath and wheezing.    Cardiovascular: Negative for chest pain, palpitations and orthopnea.  Gastrointestinal: Negative for abdominal pain, blood in stool, constipation, diarrhea, heartburn, melena, nausea and vomiting.  Genitourinary: Negative for dysuria, frequency and urgency.  Musculoskeletal: Positive for back pain and joint pain.  Skin: Positive for rash. Negative for itching.  Neurological: Negative for dizziness, tingling, focal weakness, weakness and headaches.  Endo/Heme/Allergies: Does not bruise/bleed easily.  Psychiatric/Behavioral: Negative for depression. The patient is not nervous/anxious and does not have insomnia.       PAST MEDICAL HISTORY :  Past Medical History:  Diagnosis Date  . Aortic stenosis, moderate 01/30/2014  . Appetite loss   . Arthritis   . Atrial flutter ([place]) 02/02/2014  . Back pain   . Diabetes mellitus without complication (HCC)    diet control  . GERD (gastroesophageal reflux disease)   . Gout   . Heart murmur   . Heart murmur   . History of kidney stones   . HTN (hypertension) 01/30/2014  . Hypercholesteremia   . Hypertension   . Prostate cancer ([place])    metastatic  . Weakness of left leg     PAST SURGICAL HISTORY :   Past Surgical History:  Procedure Laterality Date  . 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 CATARACT EXTRACTION W/ INTRAOCULAR LENS IMPLANT Bilateral   . COLON RESECTION    . CYSTOSCOPY W/ RETROGRADES Left 06/08/2015   Procedure: CYSTOSCOPY WITH RETROGRADE PYELOGRAM;  Surgeon: Nickie Retort, MD;  Location: [facility];  Service: Urology;  Laterality: Left;  . CYSTOSCOPY W/ RETROGRADES Left 05/13/2018   Procedure: CYSTOSCOPY WITH RETROGRADE PYELOGRAM;  Surgeon: Abbie Sons, MD;  Location: [facility];  Service: Urology;  Laterality: Left;  . CYSTOSCOPY W/ RETROGRADES Left 02/03/2019   Procedure: CYSTOSCOPY WITH RETROGRADE  PYELOGRAM;  Surgeon: Abbie Sons, MD;  Location: [facility];  Service: Urology;  Laterality: Left;  . CYSTOSCOPY W/ URETERAL STENT PLACEMENT Left 10/05/2015   Procedure: CYSTOSCOPY WITH STENT REPLACEMENT;  Surgeon: Nickie Retort, MD;  Location: [facility];  Service: Urology;  Laterality: Left;  . CYSTOSCOPY W/ URETERAL STENT PLACEMENT Left 01/04/2016   Procedure: CYSTOSCOPY WITH STENT REPLACEMENT;  Surgeon: Nickie Retort, MD;  Location: [facility];  Service: Urology;  Laterality: Left;  . CYSTOSCOPY W/ URETERAL STENT PLACEMENT Left 04/11/2016   Procedure: CYSTOSCOPY WITH STENT REPLACEMENT;  Surgeon: Nickie Retort, MD;  Location: [facility];  Service: Urology;  Laterality: Left;  . CYSTOSCOPY W/ URETERAL STENT PLACEMENT Left 07/18/2016   Procedure: CYSTOSCOPY WITH STENT REPLACEMENT;  Surgeon: Nickie Retort, MD;  Location: [facility];  Service: Urology;  Laterality: Left;  . CYSTOSCOPY W/ URETERAL STENT PLACEMENT Left 11/02/2016   Procedure: CYSTOSCOPY WITH STENT REPLACEMENT;  Surgeon: Nickie Retort, MD;  Location: [facility];  Service: Urology;  Laterality: Left;  . CYSTOSCOPY W/ URETERAL STENT PLACEMENT Left 02/08/2017   Procedure: CYSTOSCOPY WITH STENT REPLACEMENT;  Surgeon: Nickie Retort, MD;  Location: [facility];  Service: Urology;  Laterality: Left;  . CYSTOSCOPY W/ URETERAL STENT PLACEMENT Left 05/10/2017   Procedure: CYSTOSCOPY WITH STENT REPLACEMENT;  Surgeon: Nickie Retort, MD;  Location: [facility];  Service: Urology;  Laterality: Left;  . CYSTOSCOPY W/ URETERAL STENT PLACEMENT Left 05/13/2018   Procedure: CYSTOSCOPY WITH STENT Exchange;  Surgeon: Abbie Sons, MD;  Location: [facility];  Service: Urology;  Laterality: Left;  . CYSTOSCOPY W/ URETERAL STENT PLACEMENT Left 02/03/2019   Procedure: CYSTOSCOPY WITH STENT REPLACEMENT;  Surgeon: Abbie Sons, MD;  Location: [facility];  Service: Urology;  Laterality: Left;  . CYSTOSCOPY WITH STENT PLACEMENT Left 08/16/2017    Procedure: CYSTOSCOPY WITH STENT EXCHANGE;  Surgeon: Abbie Sons, MD;  Location: [facility];  Service: Urology;  Laterality: Left;  . CYSTOSCOPY WITH STENT PLACEMENT Left 12/17/2017   Procedure: CYSTOSCOPY WITH STENT EXCHANGE;  Surgeon: Abbie Sons, MD;  Location: [facility];  Service: Urology;  Laterality: Left;  . EYE SURGERY Bilateral Sept and Oct.2012   cataract  . HERNIA REPAIR    . PROSTATECTOMY  1994    FAMILY HISTORY :   Family History  Problem Relation Age of Onset  . Aneurysm Father   . Cancer Mother        breast    SOCIAL HISTORY:   Social History   Tobacco Use  . Smoking status: Former Smoker    Packs/day: 1.50    Years: 25.00    Pack years: 37.50    Types: Cigarettes    Quit date: 01/21/1974    Years since quitting: 45.1  . Smokeless tobacco: Never Used  Substance Use Topics  . Alcohol use: No  . Drug use: No    ALLERGIES:  has No Known Allergies.  MEDICATIONS:  Current Outpatient Medications  Medication Sig Dispense Refill  . allopurinol (ZYLOPRIM) 300 MG tablet Take 300 mg by mouth daily.     Marland Kitchen aspirin 81 MG tablet Take 81 mg by mouth daily.     Marland Kitchen atorvastatin (LIPITOR) 10 MG tablet  Take 10 mg by mouth daily with supper.     . carvedilol (COREG) 6.25 MG tablet Take 1 tablet (6.25 mg total) by mouth 2 (two) times daily with a meal. 60 tablet 2  . chlorthalidone (HYGROTON) 25 MG tablet Take 25 mg by mouth daily.     . fentaNYL (DURAGESIC) 50 MCG/HR Place 1 patch onto the skin every 3 (three) days. 10 patch 0  . ferrous sulfate 325 (65 FE) MG tablet Take 325 mg by mouth daily with breakfast.     . hydrochlorothiazide (MICROZIDE) 12.5 MG capsule Take 12.5 mg by mouth daily.    . metFORMIN (GLUCOPHAGE) 500 MG tablet Take 500 mg by mouth daily.     . mirabegron ER (MYRBETRIQ) 25 MG TB24 tablet Take 1 tablet (25 mg total) by mouth daily. 30 tablet 11  . ondansetron (ZOFRAN) 8 MG tablet Take 1 tablet (8 mg total) by mouth every 8 (eight) hours as needed  for nausea or vomiting. 20 tablet 3  . oxybutynin (DITROPAN-XL) 5 MG 24 hr tablet Take 1 tablet (5 mg total) by mouth 3 (three) times daily. (Patient taking differently: Take 5 mg by mouth at bedtime. ) 90 tablet 11  . Oxycodone HCl 10 MG TABS TAKE 1 TABLET BY MOUTH EVERY 6 HOURS AS NEEDED FOR PAIN 60 tablet 0  . pantoprazole (PROTONIX) 40 MG tablet Take 1 tablet (40 mg total) by mouth daily before breakfast. 30 tablet 2  . triamcinolone (KENALOG) 0.025 % ointment Apply 1 application topically 2 (two) times daily. Apply to clean, dry feet as directed 80 g 0  . vitamin B-12 (CYANOCOBALAMIN) 1000 MCG tablet Take 1,000 mcg by mouth daily.    . vitamin C (ASCORBIC ACID) 500 MG tablet Take 500 mg by mouth daily.    . Vitamin D, Ergocalciferol, (DRISDOL) 1.25 MG (50000 UT) CAPS capsule Take 5,000 Units by mouth once a week.     Gillermina Phy 40 MG capsule TAKE 4 CAPSULES (160 MG TOTAL) BY MOUTH DAILY. 120 capsule 4  . naloxone (NARCAN) nasal spray 4 mg/0.1 mL For use with opioid overdose. 1 spray delivered by intranasal administration. Call 911 (EMS) if used. (Patient not taking: Reported on 12/25/2018) 1 kit 0   No current facility-administered medications for this visit.     PHYSICAL EXAMINATION: ECOG PERFORMANCE STATUS: 1 - Symptomatic but completely ambulatory  BP 121/74   Pulse 68   Temp (!) 97 F (36.1 C) (Tympanic)   Resp 18   Ht 5' 10.5" (1.791 m)   Wt 157 lb (71.2 kg)   SpO2 97%   BMI 22.21 kg/m   Filed Weights   02/18/19 1010  Weight: 157 lb (71.2 kg)    Physical Exam  Constitutional: He is oriented to person, place, and time and well-developed, well-nourished, and in no distress.  In wheel chair.  Alone.  Appears cachectic.  HENT:  Head: Normocephalic and atraumatic.  Mouth/Throat: Oropharynx is clear and moist. No oropharyngeal exudate.  Eyes: Pupils are equal, round, and reactive to light.  Neck: Normal range of motion. Neck supple.  Cardiovascular: Normal rate and regular  rhythm.  Pulmonary/Chest: Breath sounds normal. No respiratory distress. He has no wheezes.  Abdominal: Soft. Bowel sounds are normal. He exhibits no distension and no mass. There is no abdominal tenderness. There is no rebound and no guarding.  Musculoskeletal:        General: No tenderness or edema.  Neurological: He is alert and oriented to person, place,  and time.  Skin: Skin is warm.  Psychiatric: Affect normal.  Vitals reviewed.      LABORATORY DATA:  I have reviewed the data as listed    Component Value Date/Time   NA 138 02/18/2019 0944   K 3.6 02/18/2019 0944   CL 102 02/18/2019 0944   CO2 21 (L) 02/18/2019 0944   GLUCOSE 165 (H) 02/18/2019 0944   BUN 45 (H) 02/18/2019 0944   CREATININE 1.28 (H) 02/18/2019 0944   CALCIUM 10.1 02/18/2019 0944   PROT 7.3 02/18/2019 0944   ALBUMIN 3.8 02/18/2019 0944   AST 21 02/18/2019 0944   ALT 10 02/18/2019 0944   ALKPHOS 71 02/18/2019 0944   BILITOT 0.7 02/18/2019 0944   GFRNONAA 54 (L) 02/18/2019 0944   GFRAA >60 02/18/2019 0944    No results found for: SPEP, UPEP  Lab Results  Component Value Date   WBC 6.5 02/18/2019   NEUTROABS 4.9 02/18/2019   HGB 11.4 (L) 02/18/2019   HCT 33.6 (L) 02/18/2019   MCV 92.3 02/18/2019   PLT 167 02/18/2019      Chemistry      Component Value Date/Time   NA 138 02/18/2019 0944   K 3.6 02/18/2019 0944   CL 102 02/18/2019 0944   CO2 21 (L) 02/18/2019 0944   BUN 45 (H) 02/18/2019 0944   CREATININE 1.28 (H) 02/18/2019 0944      Component Value Date/Time   CALCIUM 10.1 02/18/2019 0944   ALKPHOS 71 02/18/2019 0944   AST 21 02/18/2019 0944   ALT 10 02/18/2019 0944   BILITOT 0.7 02/18/2019 0944       RADIOGRAPHIC STUDIES: I have personally reviewed the radiological images as listed and agreed with the findings in the report. No results found.   ASSESSMENT & PLAN:  Prostate cancer metastatic to bone [facility]) # Metastatic castrate resistant prostate cancer-Lupron q 4 M and  Xgeva/Xtandi; February 2020 aux PET scan-no evidence of progressive disease in the pelvis; improving T9 lesion; right posterior rib lesion.  April 2020 PSA 6.4; rising.  Stable; however see discussion regarding weight loss  # Proceed with [place] See; continue Xtandi for now.  Await PSA from today.  Plan Lupron at next visit  #Significant weight loss of 18 pounds~6 weeks-unclear etiology likely from nausea.  Recommend evaluation with dietitian.  Increase protein intake.  #Chronic nausea unclear etiology discussed regarding MRI of the brain; patient reluctant.  Discontinue iron pills for now.  # Right hip pain- status post radiation; stable.  Continue oxycodone 10 mg every 4 hours.  Also continue fentanyl patch.  Stable  # Bone mets-on Xgeva monthly.  Calcium normal.  Stable  # CKD- stage III; creatinine 1.2-stable.  Status post exchange  #Spoke to patient's wife [name] of the above plan.  #  # DISPOSITION:  #  X-geva today # referral to [facility]] # Follow up  In 2 weeks-MD; labs- cbc/cmp; Lupron;Jolie-Dr.B   No orders of the defined types were placed in this encounter.  All questions were answered. The patient knows to call the clinic with any problems, questions or concerns.      Cammie Sickle, MD 02/18/2019 12:15 PM

## 2019-02-18 NOTE — Assessment & Plan Note (Addendum)
#   Metastatic castrate resistant prostate cancer-Lupron q 4 M and Xgeva/Xtandi; February 2020 aux PET scan-no evidence of progressive disease in the pelvis; improving T9 lesion; right posterior rib lesion.  April 2020 PSA 6.4; rising.  Stable; however see discussion regarding weight loss  # Proceed with Delton See; continue Xtandi for now.  Await PSA from today.  Plan Lupron at next visit  #Significant weight loss of 18 pounds~6 weeks-unclear etiology likely from nausea.  Recommend evaluation with dietitian.  Increase protein intake.  #Chronic nausea unclear etiology discussed regarding MRI of the brain; patient reluctant.  Discontinue iron pills for now.  # Right hip pain- status post radiation; stable.  Continue oxycodone 10 mg every 4 hours.  Also continue fentanyl patch.  Stable  # Bone mets-on Xgeva monthly.  Calcium normal.  Stable  # CKD- stage III; creatinine 1.2-stable.  Status post exchange  #Spoke to patient's wife [name] of the above plan.  #  # DISPOSITION:  #  X-geva today # referral to [facility]] # Follow up  In 2 weeks-MD; labs- cbc/cmp; Lupron;Jolie-Dr.B

## 2019-02-19 ENCOUNTER — Other Ambulatory Visit: Payer: Self-pay

## 2019-02-23 ENCOUNTER — Inpatient Hospital Stay: Payer: Medicare HMO

## 2019-02-23 ENCOUNTER — Telehealth: Payer: Self-pay | Admitting: Pharmacy Technician

## 2019-02-23 NOTE — Telephone Encounter (Signed)
Oral Oncology Patient Advocate Encounter   Was successful in securing patient an $53 grant from [facility]) to provide copayment coverage for [place].  This will keep the out of pocket expense at $0.     I have spoken with the patient.    The billing information is as follows and has been shared with [place].   Member ID: [id number] Group ID: [id number] RxBin: [id number] Dates of Eligibility: 02/19/2019 through 02/18/2020  [place] [facility] [place] Phone [phone] Fax [phone] 02/23/2019 2:09 PM

## 2019-02-23 NOTE — Progress Notes (Signed)
Nutrition Assessment   Reason for Assessment:   Weight loss   ASSESSMENT:  78 year old male with stage IV prostate cancer with metastatic disease to bone, s/p radiation and surgery.  Past medical history of HTN, DM, HLD.    RD working remotely.  Spoke with patient via phone for nutrition assessment.  Patient reports that his nausea is much better.  Reports that he is taking nausea medication once in the am and once in the pm.  Reports that he eats cereal for breakfast and boiled egg and then snacks during the day.  Reports that he snacks on sugar free cookies.  Drinks boost about 1-2 times per day (does not know which one).  Reports last night wife brought home [place] food (chicken, vegetables and rice) and he ate about half of it.  Reports ate the other half today for lunch.  Reports most often his wife will bring home a take out as she is still working.    Reports some issues with constipation.    Nutrition Focused Physical Exam: deferred   Medications: zofran (increased to TID), metformin, fentanyl, oxycodin   Labs: glucose 165, BUN 45, creatinine 1.28   Anthropometrics:   Height: 70.5 inches Weight: 157 lb Noted weight in April 2020 179 lb BMI: 22  12% weight loss in the last 3 months, significant  Estimated Energy Needs  Kcals: 2100-2485 calories Protein: 105-124 g Fluid: 2.4 L   NUTRITION DIAGNOSIS: Inadequate oral intake related to cancer related treatment side effects as evidenced by 12% weight loss in the last 3 months     INTERVENTION:  Encouraged patient to utilize nausea medication Discussed strategies to increase calories and protein. Will mail handout Encouraged 360 calorie shake and will mail coupons at least 1-2 times per day Contact information provided   MONITORING, EVALUATION, GOAL: Patient will consume adequate calories and protein to prevent further weight loss   Next Visit: phone f/u July 27  Kebin Maye B. Zenia Resides, [place], [place] Registered  Dietitian [phone] (pager)

## 2019-02-26 MED FILL — XTANDI 40 MG CAPSULE: 40 | 30 days supply | Qty: 120 | Fill #3

## 2019-03-03 ENCOUNTER — Other Ambulatory Visit: Payer: Self-pay

## 2019-03-04 ENCOUNTER — Inpatient Hospital Stay (HOSPITAL_BASED_OUTPATIENT_CLINIC_OR_DEPARTMENT_OTHER): Payer: Medicare HMO | Admitting: Hospice and Palliative Medicine

## 2019-03-04 ENCOUNTER — Other Ambulatory Visit: Payer: Self-pay

## 2019-03-04 ENCOUNTER — Inpatient Hospital Stay (HOSPITAL_BASED_OUTPATIENT_CLINIC_OR_DEPARTMENT_OTHER): Payer: Medicare HMO | Admitting: Internal Medicine

## 2019-03-04 ENCOUNTER — Inpatient Hospital Stay: Payer: Medicare HMO

## 2019-03-04 DIAGNOSIS — E1122 Type 2 diabetes mellitus with diabetic chronic kidney disease: Secondary | ICD-10-CM

## 2019-03-04 DIAGNOSIS — Z79899 Other long term (current) drug therapy: Secondary | ICD-10-CM

## 2019-03-04 DIAGNOSIS — Z515 Encounter for palliative care: Secondary | ICD-10-CM

## 2019-03-04 DIAGNOSIS — Z923 Personal history of irradiation: Secondary | ICD-10-CM

## 2019-03-04 DIAGNOSIS — C61 Malignant neoplasm of prostate: Secondary | ICD-10-CM

## 2019-03-04 DIAGNOSIS — C7951 Secondary malignant neoplasm of bone: Secondary | ICD-10-CM

## 2019-03-04 DIAGNOSIS — Z192 Hormone resistant malignancy status: Secondary | ICD-10-CM

## 2019-03-04 DIAGNOSIS — E7800 Pure hypercholesterolemia, unspecified: Secondary | ICD-10-CM

## 2019-03-04 DIAGNOSIS — I129 Hypertensive chronic kidney disease with stage 1 through stage 4 chronic kidney disease, or unspecified chronic kidney disease: Secondary | ICD-10-CM

## 2019-03-04 DIAGNOSIS — R630 Anorexia: Secondary | ICD-10-CM

## 2019-03-04 DIAGNOSIS — M129 Arthropathy, unspecified: Secondary | ICD-10-CM

## 2019-03-04 DIAGNOSIS — K219 Gastro-esophageal reflux disease without esophagitis: Secondary | ICD-10-CM

## 2019-03-04 DIAGNOSIS — Z66 Do not resuscitate: Secondary | ICD-10-CM

## 2019-03-04 DIAGNOSIS — N1330 Unspecified hydronephrosis: Secondary | ICD-10-CM

## 2019-03-04 DIAGNOSIS — R112 Nausea with vomiting, unspecified: Secondary | ICD-10-CM

## 2019-03-04 DIAGNOSIS — N183 Chronic kidney disease, stage 3 (moderate): Secondary | ICD-10-CM

## 2019-03-04 DIAGNOSIS — Z7982 Long term (current) use of aspirin: Secondary | ICD-10-CM

## 2019-03-04 DIAGNOSIS — I4892 Unspecified atrial flutter: Secondary | ICD-10-CM

## 2019-03-04 DIAGNOSIS — Z7984 Long term (current) use of oral hypoglycemic drugs: Secondary | ICD-10-CM

## 2019-03-04 DIAGNOSIS — Z87891 Personal history of nicotine dependence: Secondary | ICD-10-CM

## 2019-03-04 DIAGNOSIS — R634 Abnormal weight loss: Secondary | ICD-10-CM

## 2019-03-04 DIAGNOSIS — M25551 Pain in right hip: Secondary | ICD-10-CM

## 2019-03-04 DIAGNOSIS — I350 Nonrheumatic aortic (valve) stenosis: Secondary | ICD-10-CM

## 2019-03-04 LAB — CBC WITH DIFFERENTIAL/PLATELET
Abs Immature Granulocytes: 0.02 10*3/uL (ref 0.00–0.07)
Basophils Absolute: 0.1 10*3/uL (ref 0.0–0.1)
Basophils Relative: 1 %
Eosinophils Absolute: 0.1 10*3/uL (ref 0.0–0.5)
Eosinophils Relative: 2 %
HCT: 32.8 % — ABNORMAL LOW (ref 39.0–52.0)
Hemoglobin: 10.8 g/dL — ABNORMAL LOW (ref 13.0–17.0)
Immature Granulocytes: 0 %
Lymphocytes Relative: 11 %
Lymphs Abs: 0.7 10*3/uL (ref 0.7–4.0)
MCH: 31.3 pg (ref 26.0–34.0)
MCHC: 32.9 g/dL (ref 30.0–36.0)
MCV: 95.1 fL (ref 80.0–100.0)
Monocytes Absolute: 0.5 10*3/uL (ref 0.1–1.0)
Monocytes Relative: 9 %
Neutro Abs: 4.8 10*3/uL (ref 1.7–7.7)
Neutrophils Relative %: 77 %
Platelets: 163 10*3/uL (ref 150–400)
RBC: 3.45 MIL/uL — ABNORMAL LOW (ref 4.22–5.81)
RDW: 16 % — ABNORMAL HIGH (ref 11.5–15.5)
WBC: 6.2 10*3/uL (ref 4.0–10.5)
nRBC: 0 % (ref 0.0–0.2)

## 2019-03-04 LAB — COMPREHENSIVE METABOLIC PANEL
ALT: 10 U/L (ref 0–44)
AST: 21 U/L (ref 15–41)
Albumin: 3.5 g/dL (ref 3.5–5.0)
Alkaline Phosphatase: 100 U/L (ref 38–126)
Anion gap: 10 (ref 5–15)
BUN: 37 mg/dL — ABNORMAL HIGH (ref 8–23)
CO2: 26 mmol/L (ref 22–32)
Calcium: 9.5 mg/dL (ref 8.9–10.3)
Chloride: 99 mmol/L (ref 98–111)
Creatinine, Ser: 1.26 mg/dL — ABNORMAL HIGH (ref 0.61–1.24)
GFR calc Af Amer: >60 mL/min (ref >60)
GFR calc non Af Amer: 55 mL/min — ABNORMAL LOW (ref >60)
Glucose, Bld: 173 mg/dL — ABNORMAL HIGH (ref 70–99)
Potassium: 3.6 mmol/L (ref 3.5–5.1)
Sodium: 135 mmol/L (ref 135–145)
Total Bilirubin: 0.6 mg/dL (ref 0.3–1.2)
Total Protein: 7.2 g/dL (ref 6.5–8.1)

## 2019-03-04 NOTE — Assessment & Plan Note (Addendum)
#   Metastatic castrate resistant prostate cancer-Lupron q 4 M and Xgeva/Xtandi; February 2020 aux PET scan-no evidence of progressive disease in the pelvis; improving T9 lesion; right posterior rib lesion.  April 2020 PSA 5.6- STABLE.   # Proceed with Del[facility] See; continue Xtandi for now; Lupron due next month.  #Significant weight loss of 18 pounds-currently stable.  Improved with nausea.  #Chronic nausea unclear etiology - improved after using zofran.   # Right hip pain- status post radiation; stable.  Continue oxycodone 10 mg every 4 hours.  Also continue fentanyl patch. Stable.   # Bone mets-on Xgeva monthly.  Calcium normal.  Stable  # CKD- stage III; creatinine 1.2-stable.  Status post exchange of ureteral stents-  # pt declines my offer to talk to his wife.  Discussed with [employer].   # DISPOSITION:   # Follow up  In 4 weeks-MD; labs- cbc/cmp;X-geva/lupron- Dr.B

## 2019-03-04 NOTE — Progress Notes (Signed)
[place] OFFICE PROGRESS NOTE  Patient Care Team: Tamsen Roers, MD as PCP - General (Family Medicine) Abbie Sons, MD as Consulting Physician (Urology)  Cancer Staging No matching staging information was found for the patient.   Oncology History Overview Note  # AUG 2016- METASTATIC PROSTATE CA/ STAGE IV; Castrate sensitive [PSA- 48] ; mets- lumbar spine [s/p pal RT to Aug 2016; Dr.Crystal] /Pelvic LN; 1994- Prostate CA s/p Surgery [facility]]; Lupron q4 M; MARCH 2017- Bone scan- L4/Right pubic rami uptake; PSA- 0.1/testosterone-castrate [<3]  # Bone lesions on denosumab;DEC 2016- Recm q 62M  # NOV 2018- CASTRATE RESISTANT; declined chemo; 08/06/2017- start Zytiga+prednisone; Stopped April 24th [rising PSA];  # may 2019- Xofigo #6 on 10/16]  # OCT 16th 2019- X-tandi  # Left hydronephrosis s/p stenting [Dr.Budzyn] --------------------------------------------   DIAGNOSIS: _0  castrate resistant prostate cancer  STAGE:  IV       ;GOALS: palliative  CURRENT/MOST RECENT THERAPY ; Xtandi   Prostate cancer metastatic to bone [facility])      INTERVAL HISTORY:  [name] 78 y.o.  male pleasant patient above history of castrate resistant prostate cancer is here for follow-up/patient is currently [place].  Patient states his nausea is improved-appetite improved.  He is eating better.  His weight is stable around 157.  Not any gaining weight; however not lost weight.  His hip pain/back pain is stable.  Not any worse.  He continues to be on oxycodone/fentanyl patch.   Review of Systems  Constitutional: Positive for malaise/fatigue and weight loss. Negative for chills, diaphoresis and fever.  HENT: Negative for nosebleeds and sore throat.   Eyes: Negative for double vision.  Respiratory: Negative for cough, hemoptysis, sputum production, shortness of breath and wheezing.   Cardiovascular: Negative for chest pain, palpitations and orthopnea.  Gastrointestinal:  Negative for abdominal pain, blood in stool, constipation, diarrhea, heartburn, melena, nausea and vomiting.  Genitourinary: Negative for dysuria, frequency and urgency.  Musculoskeletal: Positive for back pain and joint pain.  Skin: Positive for rash. Negative for itching.  Neurological: Negative for dizziness, tingling, focal weakness, weakness and headaches.  Endo/Heme/Allergies: Does not bruise/bleed easily.  Psychiatric/Behavioral: Negative for depression. The patient is not nervous/anxious and does not have insomnia.       PAST MEDICAL HISTORY :  Past Medical History:  Diagnosis Date  . Aortic stenosis, moderate 01/30/2014  . Appetite loss   . Arthritis   . Atrial flutter ([place]) 02/02/2014  . Back pain   . Diabetes mellitus without complication (HCC)    diet control  . GERD (gastroesophageal reflux disease)   . Gout   . Heart murmur   . Heart murmur   . History of kidney stones   . HTN (hypertension) 01/30/2014  . Hypercholesteremia   . Hypertension   . Prostate cancer ([place])    metastatic  . Weakness of left leg     PAST SURGICAL HISTORY :   Past Surgical History:  Procedure Laterality Date  . 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 CATARACT EXTRACTION W/ INTRAOCULAR LENS IMPLANT Bilateral   . COLON RESECTION    . CYSTOSCOPY W/ RETROGRADES Left 06/08/2015   Procedure: CYSTOSCOPY WITH RETROGRADE PYELOGRAM;  Surgeon: Nickie Retort, MD;  Location: [facility] ORS;  Service: Urology;  Laterality: Left;  . CYSTOSCOPY W/ RETROGRADES Left 05/13/2018   Procedure: CYSTOSCOPY WITH RETROGRADE  PYELOGRAM;  Surgeon: Abbie Sons, MD;  Location: [facility] ORS;  Service: Urology;  Laterality: Left;  . CYSTOSCOPY W/ RETROGRADES Left 02/03/2019   Procedure: CYSTOSCOPY WITH RETROGRADE PYELOGRAM;  Surgeon: Abbie Sons, MD;  Location: [facility] ORS;  Service: Urology;  Laterality:  Left;  . CYSTOSCOPY W/ URETERAL STENT PLACEMENT Left 10/05/2015   Procedure: CYSTOSCOPY WITH STENT REPLACEMENT;  Surgeon: Nickie Retort, MD;  Location: [facility] OR[facility];  Service: Urology;  Laterality: Left;  . CYSTOSCOPY W/ URETERAL STENT PLACEMENT Left 01/04/2016   Procedure: CYSTOSCOPY WITH STENT REPLACEMENT;  Surgeon: Nickie Retort, MD;  Location: [facility];  Service: Urology;  Laterality: Left;  . CYSTOSCOPY W/ URETERAL STENT PLACEMENT Left 04/11/2016   Procedure: CYSTOSCOPY WITH STENT REPLACEMENT;  Surgeon: Nickie Retort, MD;  Location: [facility];  Service: Urology;  Laterality: Left;  . CYSTOSCOPY W/ URETERAL STENT PLACEMENT Left 07/18/2016   Procedure: CYSTOSCOPY WITH STENT REPLACEMENT;  Surgeon: Nickie Retort, MD;  Location: [facility];  Service: Urology;  Laterality: Left;  . CYSTOSCOPY W/ URETERAL STENT PLACEMENT Left 11/02/2016   Procedure: CYSTOSCOPY WITH STENT REPLACEMENT;  Surgeon: Nickie Retort, MD;  Location: [facility];  Service: Urology;  Laterality: Left;  . CYSTOSCOPY W/ URETERAL STENT PLACEMENT Left 02/08/2017   Procedure: CYSTOSCOPY WITH STENT REPLACEMENT;  Surgeon: Nickie Retort, MD;  Location: [facility];  Service: Urology;  Laterality: Left;  . CYSTOSCOPY W/ URETERAL STENT PLACEMENT Left 05/10/2017   Procedure: CYSTOSCOPY WITH STENT REPLACEMENT;  Surgeon: Nickie Retort, MD;  Location: [facility];  Service: Urology;  Laterality: Left;  . CYSTOSCOPY W/ URETERAL STENT PLACEMENT Left 05/13/2018   Procedure: CYSTOSCOPY WITH STENT Exchange;  Surgeon: Abbie Sons, MD;  Location: [facility];  Service: Urology;  Laterality: Left;  . CYSTOSCOPY W/ URETERAL STENT PLACEMENT Left 02/03/2019   Procedure: CYSTOSCOPY WITH STENT REPLACEMENT;  Surgeon: Abbie Sons, MD;  Location: [facility];  Service: Urology;  Laterality: Left;  . CYSTOSCOPY WITH STENT PLACEMENT Left 08/16/2017   Procedure: CYSTOSCOPY WITH STENT EXCHANGE;  Surgeon: Abbie Sons, MD;  Location: [facility];  Service: Urology;  Laterality: Left;  . CYSTOSCOPY WITH STENT PLACEMENT Left 12/17/2017   Procedure: CYSTOSCOPY WITH STENT EXCHANGE;  Surgeon: Abbie Sons, MD;  Location: [facility];  Service: Urology;  Laterality: Left;  . EYE SURGERY Bilateral Sept and Oct.2012   cataract  . HERNIA REPAIR    . PROSTATECTOMY  1994    FAMILY HISTORY :   Family History  Problem Relation Age of Onset  . Aneurysm Father   . Cancer Mother        breast    SOCIAL HISTORY:   Social History   Tobacco Use  . Smoking status: Former Smoker    Packs/day: 1.50    Years: 25.00    Pack years: 37.50    Types: Cigarettes    Quit date: 01/21/1974    Years since quitting: 45.1  . Smokeless tobacco: Never Used  Substance Use Topics  . Alcohol use: No  . Drug use: No    ALLERGIES:  has No Known Allergies.  MEDICATIONS:  Current Outpatient Medications  Medication Sig Dispense Refill  . allopurinol (ZYLOPRIM) 300 MG tablet Take 300 mg by mouth daily.     Marland Kitchen aspirin 81 MG tablet Take 81 mg by mouth daily.     Marland Kitchen atorvastatin (LIPITOR) 10 MG tablet Take 10 mg by mouth daily with supper.     . carvedilol (COREG)  6.25 MG tablet Take 1 tablet (6.25 mg total) by mouth 2 (two) times daily with a meal. 60 tablet 2  . chlorthalidone (HYGROTON) 25 MG tablet Take 25 mg by mouth daily.     . fentaNYL (DURAGESIC) 50 MCG/HR Place 1 patch onto the skin every 3 (three) days. 10 patch 0  . ferrous sulfate 325 (65 FE) MG tablet Take 325 mg by mouth daily with breakfast.     . hydrochlorothiazide (MICROZIDE) 12.5 MG capsule Take 12.5 mg by mouth daily.    . metFORMIN (GLUCOPHAGE) 500 MG tablet Take 500 mg by mouth daily.     . mirabegron ER (MYRBETRIQ) 25 MG TB24 tablet Take 1 tablet (25 mg total) by mouth daily. 30 tablet 11  . ondansetron (ZOFRAN) 8 MG tablet Take 1 tablet (8 mg total) by mouth every 8 (eight) hours as needed for nausea or vomiting. 60 tablet 3  . oxybutynin (DITROPAN-XL) 5 MG 24 hr tablet Take 1  tablet (5 mg total) by mouth 3 (three) times daily. (Patient taking differently: Take 5 mg by mouth at bedtime. ) 90 tablet 11  . Oxycodone HCl 10 MG TABS TAKE 1 TABLET BY MOUTH EVERY 6 HOURS AS NEEDED FOR PAIN 60 tablet 0  . pantoprazole (PROTONIX) 40 MG tablet Take 1 tablet (40 mg total) by mouth daily before breakfast. 30 tablet 2  . triamcinolone (KENALOG) 0.025 % ointment Apply 1 application topically 2 (two) times daily. Apply to clean, dry feet as directed 80 g 0  . vitamin B-12 (CYANOCOBALAMIN) 1000 MCG tablet Take 1,000 mcg by mouth daily.    . vitamin C (ASCORBIC ACID) 500 MG tablet Take 500 mg by mouth daily.    . Vitamin D, Ergocalciferol, (DRISDOL) 1.25 MG (50000 UT) CAPS capsule Take 5,000 Units by mouth once a week.     . naloxone (NARCAN) nasal spray 4 mg/0.1 mL For use with opioid overdose. 1 spray delivered by intranasal administration. Call 911 (EMS) if used. (Patient not taking: Reported on 12/25/2018) 1 kit 0  . XTANDI 40 MG capsule TAKE 4 CAPSULES (160 MG TOTAL) BY MOUTH DAILY. (Patient not taking: Reported on 03/04/2019) 120 capsule 4   No current facility-administered medications for this visit.     PHYSICAL EXAMINATION: ECOG PERFORMANCE STATUS: 1 - Symptomatic but completely ambulatory  BP 99/63 (BP Location: Left Arm, Patient Position: Sitting)   Pulse 67   Temp (!) 97 F (36.1 C) (Tympanic)   Resp 20   Wt 159 lb (72.1 kg)   SpO2 95%   BMI 22.49 kg/m   Filed Weights   03/04/19 1426  Weight: 159 lb (72.1 kg)    Physical Exam  Constitutional: He is oriented to person, place, and time and well-developed, well-nourished, and in no distress.  In wheel chair.  Alone.  Appears cachectic.  HENT:  Head: Normocephalic and atraumatic.  Mouth/Throat: Oropharynx is clear and moist. No oropharyngeal exudate.  Eyes: Pupils are equal, round, and reactive to light.  Neck: Normal range of motion. Neck supple.  Cardiovascular: Normal rate and regular rhythm.   Pulmonary/Chest: Breath sounds normal. No respiratory distress. He has no wheezes.  Abdominal: Soft. Bowel sounds are normal. He exhibits no distension and no mass. There is no abdominal tenderness. There is no rebound and no guarding.  Musculoskeletal:        General: No tenderness or edema.  Neurological: He is alert and oriented to person, place, and time.  Skin: Skin is warm.  Psychiatric:  Affect normal.  Vitals reviewed.    LABORATORY DATA:  I have reviewed the data as listed    Component Value Date/Time   NA 135 03/04/2019 1350   K 3.6 03/04/2019 1350   CL 99 03/04/2019 1350   CO2 26 03/04/2019 1350   GLUCOSE 173 (H) 03/04/2019 1350   BUN 37 (H) 03/04/2019 1350   CREATININE 1.26 (H) 03/04/2019 1350   CALCIUM 9.5 03/04/2019 1350   PROT 7.2 03/04/2019 1350   ALBUMIN 3.5 03/04/2019 1350   AST 21 03/04/2019 1350   ALT 10 03/04/2019 1350   ALKPHOS 100 03/04/2019 1350   BILITOT 0.6 03/04/2019 1350   GFRNONAA 55 (L) 03/04/2019 1350   GFRAA >60 03/04/2019 1350    No results found for: SPEP, UPEP  Lab Results  Component Value Date   WBC 6.2 03/04/2019   NEUTROABS 4.8 03/04/2019   HGB 10.8 (L) 03/04/2019   HCT 32.8 (L) 03/04/2019   MCV 95.1 03/04/2019   PLT 163 03/04/2019      Chemistry      Component Value Date/Time   NA 135 03/04/2019 1350   K 3.6 03/04/2019 1350   CL 99 03/04/2019 1350   CO2 26 03/04/2019 1350   BUN 37 (H) 03/04/2019 1350   CREATININE 1.26 (H) 03/04/2019 1350      Component Value Date/Time   CALCIUM 9.5 03/04/2019 1350   ALKPHOS 100 03/04/2019 1350   AST 21 03/04/2019 1350   ALT 10 03/04/2019 1350   BILITOT 0.6 03/04/2019 1350       RADIOGRAPHIC STUDIES: I have personally reviewed the radiological images as listed and agreed with the findings in the report. No results found.   ASSESSMENT & PLAN:  Prostate cancer metastatic to bone [facility]) # Metastatic castrate resistant prostate cancer-Lupron q 4 M and Xgeva/Xtandi; February 2020  aux PET scan-no evidence of progressive disease in the pelvis; improving T9 lesion; right posterior rib lesion.  April 2020 PSA 5.6- STABLE. however see discussion regarding weight loss  # Proceed with Delton See; continue Xtandi for now Lupron today.   #Significant weight loss of 18 pounds~6 weeks-Stable;   #Chronic nausea unclear etiology - improved after using zofran.   # Right hip pain- status post radiation; stable.  Continue oxycodone 10 mg every 4 hours.  Also continue fentanyl patch. Stable.   # Bone mets-on Xgeva monthly.  Calcium normal.  Stable  # CKD- stage III; creatinine 1.2-stable.  Status post exchange of ureteral stents-  # pt declines my offer to talk to his wife.   # DISPOSITION:   # Follow up  In 4 weeks-MD; labs- cbc/cmp;X-geva/lupron- Dr.B   Orders Placed This Encounter  Procedures  . CBC with Differential    Standing Status:   Future    Standing Expiration Date:   03/03/2020  . Comprehensive metabolic panel    Standing Status:   Future    Standing Expiration Date:   03/03/2020   All questions were answered. The patient knows to call the clinic with any problems, questions or concerns.      Cammie Sickle, MD 03/04/2019 7:53 PM

## 2019-03-04 NOTE — Progress Notes (Signed)
Pt in for follow up, reports continued decreased appetite "no appetite", continuous pain in mid epigastric area. Pt states pain meds help but doesn't take pain completely away.  Pt reports needs duragesic patch refilled.

## 2019-03-05 ENCOUNTER — Telehealth: Payer: Self-pay | Admitting: *Deleted

## 2019-03-05 ENCOUNTER — Other Ambulatory Visit: Payer: Self-pay | Admitting: Nurse Practitioner

## 2019-03-05 NOTE — Progress Notes (Signed)
[place]  [phone] [phone]   Name: [name] Date: 03/05/2019 MRN: [id number]  DOB: 03-Jul-1941  Patient Care Team: Tamsen Roers, MD as PCP - General (Family Medicine) Bernardo Heater, Ronda Fairly, MD as Consulting Physician (Urology)    REASON FOR CONSULTATION: Palliative Care consult requested for this 78 y.o. 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  PMH also notable for hydronephrosis status post stenting, moderate aortic stenosis, hypertension, diabetes.  He was referred to palliative care for help with symptom management and to address goals.  SOCIAL HISTORY:    Patient is married and lives at home with his wife.  He has several adult children.  He is a retired Patent examiner for [employer].  ADVANCE DIRECTIVES:  Not on file  CODE STATUS: DNR  PAST MEDICAL HISTORY: Past Medical History:  Diagnosis Date  . Aortic stenosis, moderate 01/30/2014  . Appetite loss   . Arthritis   . Atrial flutter ([place]) 02/02/2014  . Back pain   . Diabetes mellitus without complication (HCC)    diet control  . GERD (gastroesophageal reflux disease)   . Gout   . Heart murmur   . Heart murmur   . History of kidney stones   . HTN (hypertension) 01/30/2014  . Hypercholesteremia   . Hypertension   . Prostate cancer ([place])    metastatic  . Weakness of left leg     PAST SURGICAL HISTORY:  Past Surgical History:  Procedure Laterality Date  . 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 CATARACT EXTRACTION W/ INTRAOCULAR LENS IMPLANT Bilateral   . COLON RESECTION    . CYSTOSCOPY W/ RETROGRADES Left 06/08/2015   Procedure: CYSTOSCOPY WITH RETROGRADE PYELOGRAM;  Surgeon: Nickie Retort, MD;  Location: [facility];  Service: Urology;  Laterality: Left;  . CYSTOSCOPY W/ RETROGRADES Left 05/13/2018   Procedure: CYSTOSCOPY WITH RETROGRADE PYELOGRAM;  Surgeon: Abbie Sons, MD;  Location: [facility];  Service: Urology;  Laterality: Left;  . CYSTOSCOPY W/ RETROGRADES Left 02/03/2019   Procedure: CYSTOSCOPY WITH RETROGRADE PYELOGRAM;  Surgeon: Abbie Sons, MD;  Location: [facility];  Service: Urology;  Laterality: Left;  . CYSTOSCOPY W/ URETERAL STENT PLACEMENT Left 10/05/2015   Procedure: CYSTOSCOPY WITH STENT REPLACEMENT;  Surgeon: Nickie Retort, MD;  Location: [facility];  Service: Urology;  Laterality: Left;  . CYSTOSCOPY W/ URETERAL STENT PLACEMENT Left 01/04/2016   Procedure: CYSTOSCOPY WITH STENT REPLACEMENT;  Surgeon: Nickie Retort, MD;  Location: [facility];  Service: Urology;  Laterality: Left;  . CYSTOSCOPY W/ URETERAL STENT PLACEMENT Left 04/11/2016   Procedure: CYSTOSCOPY WITH STENT REPLACEMENT;  Surgeon: Nickie Retort, MD;  Location: [facility];  Service: Urology;  Laterality: Left;  . CYSTOSCOPY W/ URETERAL STENT PLACEMENT Left 07/18/2016   Procedure: CYSTOSCOPY WITH STENT REPLACEMENT;  Surgeon: Nickie Retort, MD;  Location: [facility];  Service: Urology;  Laterality: Left;  . CYSTOSCOPY W/ URETERAL STENT PLACEMENT Left 11/02/2016   Procedure: CYSTOSCOPY WITH STENT REPLACEMENT;  Surgeon: Nickie Retort, MD;  Location: [facility];  Service: Urology;  Laterality: Left;  . CYSTOSCOPY W/ URETERAL STENT PLACEMENT Left 02/08/2017   Procedure: CYSTOSCOPY WITH STENT REPLACEMENT;  Surgeon: Nickie Retort, MD;  Location: [facility];  Service: Urology;  Laterality: Left;  . CYSTOSCOPY W/ URETERAL STENT PLACEMENT Left 05/10/2017   Procedure: CYSTOSCOPY WITH STENT REPLACEMENT;  Surgeon: Nickie Retort, MD;  Location: [facility];  Service: Urology;  Laterality: Left;  . CYSTOSCOPY W/ URETERAL STENT PLACEMENT Left 05/13/2018   Procedure: CYSTOSCOPY WITH STENT Exchange;  Surgeon: Abbie Sons, MD;  Location: [facility];  Service: Urology;  Laterality: Left;  . CYSTOSCOPY W/ URETERAL STENT PLACEMENT Left 02/03/2019   Procedure: CYSTOSCOPY WITH STENT REPLACEMENT;  Surgeon: Abbie Sons, MD;  Location: [facility];  Service: Urology;  Laterality: Left;  . CYSTOSCOPY WITH STENT PLACEMENT Left 08/16/2017   Procedure: CYSTOSCOPY WITH STENT EXCHANGE;  Surgeon: Abbie Sons, MD;  Location: [facility];  Service: Urology;  Laterality: Left;  . CYSTOSCOPY WITH STENT PLACEMENT Left 12/17/2017   Procedure: CYSTOSCOPY WITH STENT EXCHANGE;  Surgeon: Abbie Sons, MD;  Location: [facility];  Service: Urology;  Laterality: Left;  . EYE SURGERY Bilateral Sept and Oct.2012   cataract  . HERNIA REPAIR    . PROSTATECTOMY  1994    HEMATOLOGY/ONCOLOGY HISTORY:  Oncology History Overview Note  # AUG 2016- METASTATIC PROSTATE CA/ STAGE IV; Castrate sensitive [PSA- 48] ; mets- lumbar spine [s/p pal RT to Aug 2016; Dr.Crystal] /Pelvic LN; 1994- Prostate CA s/p Surgery [facility] Lupron q4 M; MARCH 2017- Bone scan- L4/Right pubic rami uptake; PSA- 0.1/testosterone-castrate [<3]  # Bone lesions on denosumab;DEC 2016- Recm q 24M  # NOV 2018- CASTRATE RESISTANT; declined chemo; 08/06/2017- start Zytiga+prednisone; Stopped April 24th [rising PSA];  # may 2019- Xofigo #6 on 10/16]  # OCT 16th 2019- X-tandi  # Left hydronephrosis s/p stenting [Dr.Budzyn] --------------------------------------------   DIAGNOSIS: [ ]  castrate resistant prostate cancer  STAGE:  IV       ;GOALS: palliative  CURRENT/MOST RECENT THERAPY ; Xtandi   Prostate cancer metastatic to bone [facility])    ALLERGIES:  has No Known Allergies.  MEDICATIONS:  Current Outpatient Medications  Medication Sig Dispense Refill  . allopurinol (ZYLOPRIM) 300 MG tablet Take 300 mg by mouth daily.     Marland Kitchen aspirin 81 MG tablet Take 81 mg by mouth daily.     Marland Kitchen atorvastatin (LIPITOR) 10 MG tablet Take 10 mg by mouth daily with supper.      . carvedilol (COREG) 6.25 MG tablet Take 1 tablet (6.25 mg total) by mouth 2 (two) times daily with a meal. 60 tablet 2  . chlorthalidone (HYGROTON) 25 MG tablet Take 25 mg by mouth daily.     . fentaNYL (DURAGESIC) 50 MCG/HR Place 1 patch onto the skin every 3 (three) days. 10 patch 0  . ferrous sulfate 325 (65 FE) MG tablet Take 325 mg by mouth daily with breakfast.     . hydrochlorothiazide (MICROZIDE) 12.5 MG capsule Take 12.5 mg by mouth daily.    . metFORMIN (GLUCOPHAGE) 500 MG tablet Take 500 mg by mouth daily.     . mirabegron ER (MYRBETRIQ) 25 MG TB24 tablet Take 1 tablet (25 mg total) by mouth daily. 30 tablet 11  . naloxone (NARCAN) nasal spray 4 mg/0.1 mL For use with opioid overdose. 1 spray delivered by intranasal administration. Call 911 (EMS) if used. (Patient not taking: Reported on 12/25/2018) 1 kit 0  . ondansetron (ZOFRAN) 8 MG tablet Take 1 tablet (8 mg total) by mouth every 8 (eight) hours as needed for nausea or vomiting. 60 tablet 3  . oxybutynin (DITROPAN-XL) 5 MG 24 hr tablet Take 1 tablet (5  mg total) by mouth 3 (three) times daily. (Patient taking differently: Take 5 mg by mouth at bedtime. ) 90 tablet 11  . Oxycodone HCl 10 MG TABS TAKE 1 TABLET BY MOUTH EVERY 6 HOURS AS NEEDED FOR PAIN 60 tablet 0  . pantoprazole (PROTONIX) 40 MG tablet Take 1 tablet (40 mg total) by mouth daily before breakfast. 30 tablet 2  . triamcinolone (KENALOG) 0.025 % ointment Apply 1 application topically 2 (two) times daily. Apply to clean, dry feet as directed 80 g 0  . vitamin B-12 (CYANOCOBALAMIN) 1000 MCG tablet Take 1,000 mcg by mouth daily.    . vitamin C (ASCORBIC ACID) 500 MG tablet Take 500 mg by mouth daily.    . Vitamin D, Ergocalciferol, (DRISDOL) 1.25 MG (50000 UT) CAPS capsule Take 5,000 Units by mouth once a week.     Gillermina Phy 40 MG capsule TAKE 4 CAPSULES (160 MG TOTAL) BY MOUTH DAILY. (Patient not taking: Reported on 03/04/2019) 120 capsule 4   No current facility-administered  medications for this visit.     VITAL SIGNS: There were no vitals taken for this visit. There were no vitals filed for this visit.  Estimated body mass index is 22.49 kg/m as calculated from the following:   Height as of 02/18/19: 5' 10.5" (1.791 m).   Weight as of an earlier encounter on 03/04/19: 159 lb (72.1 kg).  LABS: CBC:    Component Value Date/Time   WBC 6.2 03/04/2019 1350   HGB 10.8 (L) 03/04/2019 1350   HCT 32.8 (L) 03/04/2019 1350   PLT 163 03/04/2019 1350   MCV 95.1 03/04/2019 1350   NEUTROABS 4.8 03/04/2019 1350   LYMPHSABS 0.7 03/04/2019 1350   MONOABS 0.5 03/04/2019 1350   EOSABS 0.1 03/04/2019 1350   BASOSABS 0.1 03/04/2019 1350   Comprehensive Metabolic Panel:    Component Value Date/Time   NA 135 03/04/2019 1350   K 3.6 03/04/2019 1350   CL 99 03/04/2019 1350   CO2 26 03/04/2019 1350   BUN 37 (H) 03/04/2019 1350   CREATININE 1.26 (H) 03/04/2019 1350   GLUCOSE 173 (H) 03/04/2019 1350   CALCIUM 9.5 03/04/2019 1350   AST 21 03/04/2019 1350   ALT 10 03/04/2019 1350   ALKPHOS 100 03/04/2019 1350   BILITOT 0.6 03/04/2019 1350   PROT 7.2 03/04/2019 1350   ALBUMIN 3.5 03/04/2019 1350    RADIOGRAPHIC STUDIES: No results found.  PERFORMANCE STATUS (ECOG) : 3 - Symptomatic, >50% confined to bed  Review of Systems As noted above. Otherwise, a complete review of systems is negative.  Physical Exam General: NAD, frail appearing, thin Cardiovascular: regular rate and rhythm Pulmonary: clear ant fields Abdomen: soft, nontender, + bowel sounds GU: no suprapubic tenderness Extremities: no edema, no joint deformities Skin: no rashes Neurological: Weakness but otherwise nonfocal  IMPRESSION: Follow-up visit today in the clinic.  Patient reports doing reasonably well without significant changes since last seen.  He says that the nausea is significantly improved with as needed Zofran.  Patient reports pain is stable on current regimen of transdermal fentanyl  and PRN oxycodone.  Oral intake is reportedly adequate.  Weight appears stable at 159 pounds.  Patient is frail-appearing but denies changes in performance status.  He ambulates around the house with use of a cane.  He denies falls.  Patient says that he is coping reasonably well with his disease.  He denies insomnia, anxiety, or depressive symptoms.  PLAN: -Continue current scope of treatment -Continue transdermal  fentanyl 50 mcg every 72 hours and oxycodone 10 mg every 8 hours PRN for breakthrough pain -Continue Zofran as needed -RTC in 1 month or sooner if needed   Patient expressed understanding and was in agreement with this plan. He also understands that He can call clinic at any time with any questions, concerns, or complaints.     Time Total: 15 minutes  Visit consisted of counseling and education dealing with the complex and emotionally intense issues of symptom management and palliative care in the setting of serious and potentially life-threatening illness.Greater than 50%  of this time was spent counseling and coordinating care related to the above assessment and plan.  Signed by: Altha Harm, PhD, NP-C [phone] (Work Cell)

## 2019-03-05 NOTE — Telephone Encounter (Signed)
Pt's Wife would like to discuss pt's care with Dr. Rogue Bussing. Msg give to Dr. B to have md call the [phone] cell #.

## 2019-03-06 ENCOUNTER — Telehealth: Payer: Self-pay | Admitting: Hospice and Palliative Medicine

## 2019-03-06 NOTE — Telephone Encounter (Signed)
I called and spoke with patient's wife. Updated her on the visit yesterday. She feels patient is doing reasonably well and had no questions or concerns.

## 2019-03-09 ENCOUNTER — Telehealth: Payer: Self-pay | Admitting: Internal Medicine

## 2019-03-09 NOTE — Telephone Encounter (Signed)
Spoke to patient's wife regarding patient's clinical status.  All questions answered to the best of my knowledge.

## 2019-03-10 ENCOUNTER — Telehealth: Payer: Self-pay | Admitting: Student

## 2019-03-10 NOTE — Telephone Encounter (Signed)
Palliative NP left message to set up follow up visit in the home; awaiting return call.

## 2019-03-13 ENCOUNTER — Other Ambulatory Visit: Payer: Self-pay

## 2019-03-16 ENCOUNTER — Other Ambulatory Visit: Payer: Self-pay

## 2019-03-16 ENCOUNTER — Other Ambulatory Visit: Payer: Self-pay | Admitting: *Deleted

## 2019-03-16 ENCOUNTER — Inpatient Hospital Stay: Payer: Medicare HMO

## 2019-03-16 MED ORDER — OXYCODONE HCL 10 MG PO TABS: 10.0000 mg | ORAL_TABLET | Freq: Four times a day (QID) | ORAL | 0 refills | Status: DC | PRN

## 2019-03-16 NOTE — Progress Notes (Addendum)
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    Spoke with patient via phone for nutrition follow-up.  Patient reports that his appetite is a little bit better.  Reports that he is taking zofran to help with nausea.  Reports that he typically eats cereal or eggs and toast and jelly for breakfast.  Lunch is usually peanut butter crackers or banana and mayo sandwich.  Supper is meat and vegetables (last night fried chicken thigh, green beans, okra and 2 rolls).  Reports that he is drinking boost 2-3 times per day.  Reports constipation.  Medications: reviewed  Labs: reviewed  Anthropometrics:   Weight 159 lb on 7/15 increased from 157 lb  NUTRITION DIAGNOSIS:  Inadequate oral intake stable    INTERVENTION:  Encouraged boost plus for additional calories. Patient verbalized understanding. Reviewed ways to increase calories and protein with current eating pattern.  Patient verbalized understanding. Encouraged patient to discuss further medication management with MD regarding constipation.       MONITORING, EVALUATION, GOAL: Patient will consume adequate calories and protein to prevent further weight loss   NEXT VISIT: phone f/u August 31  Zakiyah Diop B. Zenia Resides, [place], [place] Registered Dietitian [phone] (pager)

## 2019-03-16 NOTE — Telephone Encounter (Signed)
Patient called. Spoke to [employer], Therapist, sports. He is completely out of his oxycodone and is requesting a RF as soon as possible.

## 2019-03-30 MED FILL — XTANDI 40 MG CAPSULE: 40 | 30 days supply | Qty: 120 | Fill #4

## 2019-03-31 ENCOUNTER — Other Ambulatory Visit: Payer: Self-pay

## 2019-04-01 ENCOUNTER — Inpatient Hospital Stay: Payer: Medicare HMO

## 2019-04-01 ENCOUNTER — Inpatient Hospital Stay (HOSPITAL_BASED_OUTPATIENT_CLINIC_OR_DEPARTMENT_OTHER): Payer: Medicare HMO | Admitting: Hospice and Palliative Medicine

## 2019-04-01 ENCOUNTER — Other Ambulatory Visit: Payer: Self-pay

## 2019-04-01 ENCOUNTER — Inpatient Hospital Stay: Payer: Medicare HMO | Attending: Internal Medicine

## 2019-04-01 ENCOUNTER — Encounter: Payer: Self-pay | Admitting: Internal Medicine

## 2019-04-01 ENCOUNTER — Inpatient Hospital Stay (HOSPITAL_BASED_OUTPATIENT_CLINIC_OR_DEPARTMENT_OTHER): Payer: Medicare HMO | Admitting: Internal Medicine

## 2019-04-01 DIAGNOSIS — E7800 Pure hypercholesterolemia, unspecified: Secondary | ICD-10-CM | POA: Insufficient documentation

## 2019-04-01 DIAGNOSIS — R011 Cardiac murmur, unspecified: Secondary | ICD-10-CM | POA: Diagnosis not present

## 2019-04-01 DIAGNOSIS — M109 Gout, unspecified: Secondary | ICD-10-CM | POA: Diagnosis not present

## 2019-04-01 DIAGNOSIS — K219 Gastro-esophageal reflux disease without esophagitis: Secondary | ICD-10-CM | POA: Diagnosis not present

## 2019-04-01 DIAGNOSIS — Z79899 Other long term (current) drug therapy: Secondary | ICD-10-CM | POA: Insufficient documentation

## 2019-04-01 DIAGNOSIS — Z7984 Long term (current) use of oral hypoglycemic drugs: Secondary | ICD-10-CM | POA: Insufficient documentation

## 2019-04-01 DIAGNOSIS — Z79818 Long term (current) use of other agents affecting estrogen receptors and estrogen levels: Secondary | ICD-10-CM | POA: Diagnosis not present

## 2019-04-01 DIAGNOSIS — C61 Malignant neoplasm of prostate: Secondary | ICD-10-CM

## 2019-04-01 DIAGNOSIS — C7951 Secondary malignant neoplasm of bone: Secondary | ICD-10-CM

## 2019-04-01 DIAGNOSIS — M129 Arthropathy, unspecified: Secondary | ICD-10-CM | POA: Diagnosis not present

## 2019-04-01 DIAGNOSIS — R630 Anorexia: Secondary | ICD-10-CM | POA: Insufficient documentation

## 2019-04-01 DIAGNOSIS — Z87442 Personal history of urinary calculi: Secondary | ICD-10-CM | POA: Insufficient documentation

## 2019-04-01 DIAGNOSIS — R531 Weakness: Secondary | ICD-10-CM | POA: Diagnosis not present

## 2019-04-01 DIAGNOSIS — I10 Essential (primary) hypertension: Secondary | ICD-10-CM | POA: Insufficient documentation

## 2019-04-01 DIAGNOSIS — I350 Nonrheumatic aortic (valve) stenosis: Secondary | ICD-10-CM | POA: Diagnosis not present

## 2019-04-01 DIAGNOSIS — E118 Type 2 diabetes mellitus with unspecified complications: Secondary | ICD-10-CM | POA: Insufficient documentation

## 2019-04-01 DIAGNOSIS — I4892 Unspecified atrial flutter: Secondary | ICD-10-CM | POA: Insufficient documentation

## 2019-04-01 LAB — COMPREHENSIVE METABOLIC PANEL
ALT: 11 U/L (ref 0–44)
AST: 21 U/L (ref 15–41)
Albumin: 3.5 g/dL (ref 3.5–5.0)
Alkaline Phosphatase: 93 U/L (ref 38–126)
Anion gap: 12 (ref 5–15)
BUN: 35 mg/dL — ABNORMAL HIGH (ref 8–23)
CO2: 26 mmol/L (ref 22–32)
Calcium: 9.8 mg/dL (ref 8.9–10.3)
Chloride: 99 mmol/L (ref 98–111)
Creatinine, Ser: 1.46 mg/dL — ABNORMAL HIGH (ref 0.61–1.24)
GFR calc Af Amer: 53 mL/min — ABNORMAL LOW (ref >60)
GFR calc non Af Amer: 46 mL/min — ABNORMAL LOW (ref >60)
Glucose, Bld: 176 mg/dL — ABNORMAL HIGH (ref 70–99)
Potassium: 3.6 mmol/L (ref 3.5–5.1)
Sodium: 137 mmol/L (ref 135–145)
Total Bilirubin: 0.6 mg/dL (ref 0.3–1.2)
Total Protein: 7.1 g/dL (ref 6.5–8.1)

## 2019-04-01 LAB — CBC WITH DIFFERENTIAL/PLATELET
Abs Immature Granulocytes: 0.03 10*3/uL (ref 0.00–0.07)
Basophils Absolute: 0.1 10*3/uL (ref 0.0–0.1)
Basophils Relative: 1 %
Eosinophils Absolute: 0.1 10*3/uL (ref 0.0–0.5)
Eosinophils Relative: 2 %
HCT: 30.6 % — ABNORMAL LOW (ref 39.0–52.0)
Hemoglobin: 10.1 g/dL — ABNORMAL LOW (ref 13.0–17.0)
Immature Granulocytes: 1 %
Lymphocytes Relative: 12 %
Lymphs Abs: 0.7 10*3/uL (ref 0.7–4.0)
MCH: 32 pg (ref 26.0–34.0)
MCHC: 33 g/dL (ref 30.0–36.0)
MCV: 96.8 fL (ref 80.0–100.0)
Monocytes Absolute: 0.4 10*3/uL (ref 0.1–1.0)
Monocytes Relative: 7 %
Neutro Abs: 4.5 10*3/uL (ref 1.7–7.7)
Neutrophils Relative %: 77 %
Platelets: 170 10*3/uL (ref 150–400)
RBC: 3.16 MIL/uL — ABNORMAL LOW (ref 4.22–5.81)
RDW: 14.8 % (ref 11.5–15.5)
WBC: 5.8 10*3/uL (ref 4.0–10.5)
nRBC: 0 % (ref 0.0–0.2)

## 2019-04-01 MED ORDER — OXYCODONE HCL 10 MG PO TABS: 10.0000 mg | ORAL_TABLET | Freq: Two times a day (BID) | ORAL | 0 refills | Status: DC | PRN

## 2019-04-01 MED ORDER — FENTANYL 50 MCG/HR TD PT72: 1.0000 | MEDICATED_PATCH | TRANSDERMAL | 0 refills | Status: AC

## 2019-04-01 MED ORDER — DENOSUMAB 120 MG/1.7ML ~~LOC~~ SOLN
120.0000 mg | Freq: Once | SUBCUTANEOUS | Status: AC
  Administered 2019-04-01: 15:00:00 120 mg via SUBCUTANEOUS
  Filled 2019-04-01: qty 1.7

## 2019-04-01 MED ORDER — LEUPROLIDE ACETATE (4 MONTH) 30 MG IM KIT
30.0000 mg | PACK | Freq: Once | INTRAMUSCULAR | Status: AC
  Administered 2019-04-01: 30 mg via INTRAMUSCULAR
  Filled 2019-04-01: qty 30

## 2019-04-01 NOTE — Progress Notes (Signed)
[place]  Telephone:([phone] [phone]   Name: [name] Date: 04/01/2019 MRN: [id number]  DOB: [birth year]  Patient Care Team: Tamsen Roers, MD as PCP - General (Family Medicine) Bernardo Heater, Ronda Fairly, MD as Consulting Physician (Urology)    REASON FOR CONSULTATION: Palliative Care consult requested for this 78 y.o. 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  PMH also notable for hydronephrosis status post stenting, moderate aortic stenosis, hypertension, diabetes.  He was referred to palliative care for help with symptom management and to address goals.  SOCIAL HISTORY:    Patient is married and lives at home with his wife.  He has several adult children.  He is a retired Patent examiner for [employer].  ADVANCE DIRECTIVES:  Not on file  CODE STATUS: DNR  PAST MEDICAL HISTORY: Past Medical History:  Diagnosis Date  . Aortic stenosis, moderate 01/30/2014  . Appetite loss   . Arthritis   . Atrial flutter ([place]) 02/02/2014  . Back pain   . Diabetes mellitus without complication (HCC)    diet control  . GERD (gastroesophageal reflux disease)   . Gout   . Heart murmur   . Heart murmur   . History of kidney stones   . HTN (hypertension) 01/30/2014  . Hypercholesteremia   . Hypertension   . Prostate cancer ([place])    metastatic  . Weakness of left leg     PAST SURGICAL HISTORY:  Past Surgical History:  Procedure Laterality Date  . 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 CATARACT EXTRACTION W/ INTRAOCULAR LENS IMPLANT Bilateral   . COLON RESECTION    . CYSTOSCOPY W/ RETROGRADES Left 06/08/2015   Procedure: CYSTOSCOPY WITH RETROGRADE PYELOGRAM;  Surgeon: Nickie Retort, MD;  Location: [facility]  ORS;  Service: Urology;  Laterality: Left;  . CYSTOSCOPY W/ RETROGRADES Left 05/13/2018   Procedure: CYSTOSCOPY WITH RETROGRADE PYELOGRAM;  Surgeon: Abbie Sons, MD;  Location: [facility] ORS;  Service: Urology;  Laterality: Left;  . CYSTOSCOPY W/ RETROGRADES Left 02/03/2019   Procedure: CYSTOSCOPY WITH RETROGRADE PYELOGRAM;  Surgeon: Abbie Sons, MD;  Location: [facility] ORS;  Service: Urology;  Laterality: Left;  . CYSTOSCOPY W/ URETERAL STENT PLACEMENT Left 10/05/2015   Procedure: CYSTOSCOPY WITH STENT REPLACEMENT;  Surgeon: Nickie Retort, MD;  Location: [facility] ORS;  Service: Urology;  Laterality: Left;  . CYSTOSCOPY W/ URETERAL STENT PLACEMENT Left 01/04/2016   Procedure: CYSTOSCOPY WITH STENT REPLACEMENT;  Surgeon: Nickie Retort, MD;  Location: [facility] ORS;  Service: Urology;  Laterality: Left;  . CYSTOSCOPY W/ URETERAL STENT PLACEMENT Left 04/11/2016   Procedure: CYSTOSCOPY WITH STENT REPLACEMENT;  Surgeon: Nickie Retort, MD;  Location: [facility] ORS;  Service: Urology;  Laterality: Left;  . CYSTOSCOPY W/ URETERAL STENT PLACEMENT Left 07/18/2016   Procedure: CYSTOSCOPY WITH STENT REPLACEMENT;  Surgeon: Nickie Retort, MD;  Location: [facility] ORS;  Service: Urology;  Laterality: Left;  . CYSTOSCOPY W/ URETERAL STENT PLACEMENT Left 11/02/2016   Procedure: CYSTOSCOPY WITH STENT REPLACEMENT;  Surgeon: Nickie Retort, MD;  Location: [facility] ORS;  Service: Urology;  Laterality: Left;  . CYSTOSCOPY W/ URETERAL STENT PLACEMENT Left 02/08/2017   Procedure: CYSTOSCOPY WITH STENT REPLACEMENT;  Surgeon: Nickie Retort, MD;  Location: [facility] ORS;  Service: Urology;  Laterality: Left;  . CYSTOSCOPY W/ URETERAL STENT PLACEMENT Left 05/10/2017   Procedure: CYSTOSCOPY WITH STENT REPLACEMENT;  Surgeon: Nickie Retort, MD;  Location: [facility] ORS;  Service: Urology;  Laterality: Left;  . CYSTOSCOPY W/ URETERAL STENT PLACEMENT Left 05/13/2018   Procedure: CYSTOSCOPY WITH STENT Exchange;  Surgeon: Abbie Sons, MD;  Location: [facility] ORS;  Service: Urology;  Laterality: Left;  . CYSTOSCOPY W/ URETERAL STENT PLACEMENT Left 02/03/2019   Procedure: CYSTOSCOPY WITH STENT REPLACEMENT;  Surgeon: Abbie Sons, MD;  Location: [facility] ORS;  Service: Urology;  Laterality: Left;  . CYSTOSCOPY WITH STENT PLACEMENT Left 08/16/2017   Procedure: CYSTOSCOPY WITH STENT EXCHANGE;  Surgeon: Abbie Sons, MD;  Location: [facility] ORS;  Service: Urology;  Laterality: Left;  . CYSTOSCOPY WITH STENT PLACEMENT Left 12/17/2017   Procedure: CYSTOSCOPY WITH STENT EXCHANGE;  Surgeon: Abbie Sons, MD;  Location: [facility] ORS;  Service: Urology;  Laterality: Left;  . EYE SURGERY Bilateral Sept and Oct.2012   cataract  . HERNIA REPAIR    . PROSTATECTOMY  1994    HEMATOLOGY/ONCOLOGY HISTORY:  Oncology History Overview Note  # AUG 2016- METASTATIC PROSTATE CA/ STAGE IV; Castrate sensitive [PSA- 48] ; mets- lumbar spine [s/p pal RT to Aug 2016; Dr.Crystal] /Pelvic LN; 1994- Prostate CA s/p Surgery [facility]]; Lupron q4 M; MARCH 2017- Bone scan- L4/Right pubic rami uptake; PSA- 0.1/testosterone-castrate [<3]  # Bone lesions on denosumab;DEC 2016- Recm q 75M  # NOV 2018- CASTRATE RESISTANT; declined chemo; 08/06/2017- start Zytiga+prednisone; Stopped April 24th [rising PSA];  # may 2019- Xofigo #6 on 10/16]  # OCT 16th 2019- X-tandi  # Left hydronephrosis s/p stenting [Dr.Budzyn] --------------------------------------------   DIAGNOSIS: [ ]  castrate resistant prostate cancer  STAGE:  IV       ;GOALS: palliative  CURRENT/MOST RECENT THERAPY ; Xtandi   Prostate cancer metastatic to bone [facility])    ALLERGIES:  has No Known Allergies.  MEDICATIONS:  Current Outpatient Medications  Medication Sig Dispense Refill  . allopurinol (ZYLOPRIM) 300 MG tablet Take 300 mg by mouth daily.     Marland Kitchen aspirin 81 MG tablet Take 81 mg by mouth daily.     Marland Kitchen atorvastatin (LIPITOR) 10 MG tablet Take 10 mg by mouth daily with supper.      . carvedilol (COREG) 6.25 MG tablet Take 1 tablet (6.25 mg total) by mouth 2 (two) times daily with a meal. 60 tablet 2  . chlorthalidone (HYGROTON) 25 MG tablet Take 25 mg by mouth daily.     . famotidine (PEPCID) 40 MG tablet     . fentaNYL (DURAGESIC) 50 MCG/HR Place 1 patch onto the skin every 3 (three) days. 10 patch 0  . ferrous sulfate 325 (65 FE) MG tablet Take 325 mg by mouth daily with breakfast.     . hydrochlorothiazide (MICROZIDE) 12.5 MG capsule Take 12.5 mg by mouth daily.    . metFORMIN (GLUCOPHAGE) 500 MG tablet Take 500 mg by mouth daily.     . mirabegron ER (MYRBETRIQ) 25 MG TB24 tablet Take 1 tablet (25 mg total) by mouth daily. 30 tablet 11  . naloxone (NARCAN) nasal spray 4 mg/0.1 mL For use with opioid overdose. 1 spray delivered by intranasal administration. Call 911 (EMS) if used. (Patient not taking: Reported on 12/25/2018) 1 kit 0  . ondansetron (ZOFRAN) 8 MG tablet Take 1 tablet (8 mg total) by mouth every 8 (eight) hours as needed for nausea or vomiting. 60 tablet 3  . oxybutynin (  DITROPAN-XL) 5 MG 24 hr tablet Take 1 tablet (5 mg total) by mouth 3 (three) times daily. (Patient taking differently: Take 5 mg by mouth at bedtime. ) 90 tablet 11  . Oxycodone HCl 10 MG TABS Take 1 tablet (10 mg total) by mouth every 12 (twelve) hours as needed. for pain 60 tablet 0  . pantoprazole (PROTONIX) 40 MG tablet Take 1 tablet (40 mg total) by mouth daily before breakfast. 30 tablet 2  . triamcinolone (KENALOG) 0.025 % ointment Apply 1 application topically 2 (two) times daily. Apply to clean, dry feet as directed 80 g 0  . vitamin B-12 (CYANOCOBALAMIN) 1000 MCG tablet Take 1,000 mcg by mouth daily.    . vitamin C (ASCORBIC ACID) 500 MG tablet Take 500 mg by mouth daily.    . Vitamin D, Ergocalciferol, (DRISDOL) 1.25 MG (50000 UT) CAPS capsule Take 5,000 Units by mouth once a week.     Gillermina Phy 40 MG capsule TAKE 4 CAPSULES (160 MG TOTAL) BY MOUTH DAILY. 120 capsule 4   No current  facility-administered medications for this visit.    Facility-Administered Medications Ordered in Other Visits  Medication Dose Route Frequency Provider Last Rate Last Dose  . denosumab (XGEVA) injection 120 mg  120 mg Subcutaneous Once Charlaine Dalton R, MD      . leuprolide (LUPRON) injection 30 mg  30 mg Intramuscular Once Cammie Sickle, MD        VITAL SIGNS: There were no vitals taken for this visit. There were no vitals filed for this visit.  Estimated body mass index is 21.78 kg/m as calculated from the following:   Height as of 02/18/19: 5' 10.5" (1.791 m).   Weight as of an earlier encounter on 04/01/19: 154 lb (69.9 kg).  LABS: CBC:    Component Value Date/Time   WBC 5.8 04/01/2019 1259   HGB 10.1 (L) 04/01/2019 1259   HCT 30.6 (L) 04/01/2019 1259   PLT 170 04/01/2019 1259   MCV 96.8 04/01/2019 1259   NEUTROABS 4.5 04/01/2019 1259   LYMPHSABS 0.7 04/01/2019 1259   MONOABS 0.4 04/01/2019 1259   EOSABS 0.1 04/01/2019 1259   BASOSABS 0.1 04/01/2019 1259   Comprehensive Metabolic Panel:    Component Value Date/Time   NA 137 04/01/2019 1259   K 3.6 04/01/2019 1259   CL 99 04/01/2019 1259   CO2 26 04/01/2019 1259   BUN 35 (H) 04/01/2019 1259   CREATININE 1.46 (H) 04/01/2019 1259   GLUCOSE 176 (H) 04/01/2019 1259   CALCIUM 9.8 04/01/2019 1259   AST 21 04/01/2019 1259   ALT 11 04/01/2019 1259   ALKPHOS 93 04/01/2019 1259   BILITOT 0.6 04/01/2019 1259   PROT 7.1 04/01/2019 1259   ALBUMIN 3.5 04/01/2019 1259    RADIOGRAPHIC STUDIES: No results found.  PERFORMANCE STATUS (ECOG) : 3 - Symptomatic, >50% confined to bed  Review of Systems As noted above. Otherwise, a complete review of systems is negative.  Physical Exam General: NAD, frail appearing, thin Pulmonary: unlabored Skin: no rashes Neurological: Weakness but otherwise nonfocal  IMPRESSION: Follow-up visit today in the clinic.    Patient is noticeably frailer today in wheelchair. He  reports worsening weakness since last seen. He has had one fall. He is now using a walker to ambulate and his wife is having to stay with him more during the day. I also met with wife in the parking lot. She is concerned that patient is sleeping more during the day.  Case discussed with Dr. Jacinto Reap. Would recommend imaging brain. Patient/wife is interested in resources in the home. They do have a nurse coming weekly to check on him but it is unclear what agency this is through. Would recommend home health PT/OT/SN.   Also consider MRI brain.   Patient reports stable pain and nausea. Oral intake is reportedly poor but weight is relatively stable at 154lbs.   PLAN: -Continue current scope of treatment -Continue transdermal fentanyl 50 mcg every 72 hours and oxycodone 10 mg every 8 hours PRN for breakthrough pain -Continue Zofran as needed -Referral home health/clinic OT/home-based palliative care -RTC in 1 month or sooner if needed   Patient expressed understanding and was in agreement with this plan. He also understands that He can call clinic at any time with any questions, concerns, or complaints.     Time Total: 30 minutes  Visit consisted of counseling and education dealing with the complex and emotionally intense issues of symptom management and palliative care in the setting of serious and potentially life-threatening illness.Greater than 50%  of this time was spent counseling and coordinating care related to the above assessment and plan.  Signed by: Altha Harm, PhD, NP-C [phone] (Work Cell)

## 2019-04-01 NOTE — Progress Notes (Signed)
Pt in for follow up, needs refills on pain meds.

## 2019-04-01 NOTE — Progress Notes (Signed)
[place] OFFICE PROGRESS NOTE  Patient Care Team: Tamsen Roers, MD as PCP - General (Family Medicine) Abbie Sons, MD as Consulting Physician (Urology)  Cancer Staging No matching staging information was found for the patient.   Oncology History Overview Note  # AUG 2016- METASTATIC PROSTATE CA/ STAGE IV; Castrate sensitive [PSA- 48] ; mets- lumbar spine [s/p pal RT to Aug 2016; Dr.Crystal] /Pelvic LN; 1994- Prostate CA s/p Surgery [facility]]; Lupron q4 M; MARCH 2017- Bone scan- L4/Right pubic rami uptake; PSA- 0.1/testosterone-castrate [<3]  # Bone lesions on denosumab;DEC 2016- Recm q 54M  # NOV 2018- CASTRATE RESISTANT; declined chemo; 08/06/2017- start Zytiga+prednisone; Stopped April 24th [rising PSA];  # may 2019- Xofigo #6 on 10/16]  # OCT 16th 2019- X-tandi  # Left hydronephrosis s/p stenting [Dr.Budzyn] --------------------------------------------   DIAGNOSIS: [ ]  castrate resistant prostate cancer  STAGE:  IV       ;GOALS: palliative  CURRENT/MOST RECENT THERAPY ; Xtandi   Prostate cancer metastatic to bone [facility])      INTERVAL HISTORY:  [name] 78 y.o.  male pleasant patient above history of castrate resistant prostate cancer is here for follow-up/patient is currently [place].  Patient continues to have intermittent nausea.  States improved with Zofran.  His weight is stable.   Denies any falls.  Continues to have chronic hip pain back pain which is stable.  He continues to been oxycodone and fentanyl patch.  Review of Systems  Constitutional: Positive for malaise/fatigue and weight loss. Negative for chills, diaphoresis and fever.  HENT: Negative for nosebleeds and sore throat.   Eyes: Negative for double vision.  Respiratory: Negative for cough, hemoptysis, sputum production, shortness of breath and wheezing.   Cardiovascular: Negative for chest pain, palpitations and orthopnea.  Gastrointestinal: Negative for abdominal pain,  blood in stool, constipation, diarrhea, heartburn, melena, nausea and vomiting.  Genitourinary: Negative for dysuria, frequency and urgency.  Musculoskeletal: Positive for back pain and joint pain.  Skin: Negative for itching.  Neurological: Negative for dizziness, tingling, focal weakness, weakness and headaches.  Endo/Heme/Allergies: Does not bruise/bleed easily.  Psychiatric/Behavioral: Negative for depression. The patient is not nervous/anxious and does not have insomnia.       PAST MEDICAL HISTORY :  Past Medical History:  Diagnosis Date  . Aortic stenosis, moderate 01/30/2014  . Appetite loss   . Arthritis   . Atrial flutter ([place]) 02/02/2014  . Back pain   . Diabetes mellitus without complication (HCC)    diet control  . GERD (gastroesophageal reflux disease)   . Gout   . Heart murmur   . Heart murmur   . History of kidney stones   . HTN (hypertension) 01/30/2014  . Hypercholesteremia   . Hypertension   . Prostate cancer ([place])    metastatic  . Weakness of left leg     PAST SURGICAL HISTORY :   Past Surgical History:  Procedure Laterality Date  . 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 CATARACT EXTRACTION W/ INTRAOCULAR LENS IMPLANT Bilateral   . COLON RESECTION    . CYSTOSCOPY W/ RETROGRADES Left 06/08/2015   Procedure: CYSTOSCOPY WITH RETROGRADE PYELOGRAM;  Surgeon: Nickie Retort, MD;  Location: [facility];  Service: Urology;  Laterality: Left;  . CYSTOSCOPY W/ RETROGRADES Left 05/13/2018   Procedure: CYSTOSCOPY WITH RETROGRADE PYELOGRAM;  Surgeon: Abbie Sons, MD;  Location:  [facility];  Service: Urology;  Laterality: Left;  . CYSTOSCOPY W/ RETROGRADES Left 02/03/2019   Procedure: CYSTOSCOPY WITH RETROGRADE PYELOGRAM;  Surgeon: Abbie Sons, MD;  Location: [facility];  Service: Urology;  Laterality: Left;  . CYSTOSCOPY W/ URETERAL STENT PLACEMENT  Left 10/05/2015   Procedure: CYSTOSCOPY WITH STENT REPLACEMENT;  Surgeon: Nickie Retort, MD;  Location: [facility];  Service: Urology;  Laterality: Left;  . CYSTOSCOPY W/ URETERAL STENT PLACEMENT Left 01/04/2016   Procedure: CYSTOSCOPY WITH STENT REPLACEMENT;  Surgeon: Nickie Retort, MD;  Location: [facility];  Service: Urology;  Laterality: Left;  . CYSTOSCOPY W/ URETERAL STENT PLACEMENT Left 04/11/2016   Procedure: CYSTOSCOPY WITH STENT REPLACEMENT;  Surgeon: Nickie Retort, MD;  Location: [facility];  Service: Urology;  Laterality: Left;  . CYSTOSCOPY W/ URETERAL STENT PLACEMENT Left 07/18/2016   Procedure: CYSTOSCOPY WITH STENT REPLACEMENT;  Surgeon: Nickie Retort, MD;  Location: [facility];  Service: Urology;  Laterality: Left;  . CYSTOSCOPY W/ URETERAL STENT PLACEMENT Left 11/02/2016   Procedure: CYSTOSCOPY WITH STENT REPLACEMENT;  Surgeon: Nickie Retort, MD;  Location: [facility];  Service: Urology;  Laterality: Left;  . CYSTOSCOPY W/ URETERAL STENT PLACEMENT Left 02/08/2017   Procedure: CYSTOSCOPY WITH STENT REPLACEMENT;  Surgeon: Nickie Retort, MD;  Location: [facility];  Service: Urology;  Laterality: Left;  . CYSTOSCOPY W/ URETERAL STENT PLACEMENT Left 05/10/2017   Procedure: CYSTOSCOPY WITH STENT REPLACEMENT;  Surgeon: Nickie Retort, MD;  Location: [facility];  Service: Urology;  Laterality: Left;  . CYSTOSCOPY W/ URETERAL STENT PLACEMENT Left 05/13/2018   Procedure: CYSTOSCOPY WITH STENT Exchange;  Surgeon: Abbie Sons, MD;  Location: [facility];  Service: Urology;  Laterality: Left;  . CYSTOSCOPY W/ URETERAL STENT PLACEMENT Left 02/03/2019   Procedure: CYSTOSCOPY WITH STENT REPLACEMENT;  Surgeon: Abbie Sons, MD;  Location: [facility];  Service: Urology;  Laterality: Left;  . CYSTOSCOPY WITH STENT PLACEMENT Left 08/16/2017   Procedure: CYSTOSCOPY WITH STENT EXCHANGE;  Surgeon: Abbie Sons, MD;  Location: [facility];  Service: Urology;  Laterality: Left;  .  CYSTOSCOPY WITH STENT PLACEMENT Left 12/17/2017   Procedure: CYSTOSCOPY WITH STENT EXCHANGE;  Surgeon: Abbie Sons, MD;  Location: [facility];  Service: Urology;  Laterality: Left;  . EYE SURGERY Bilateral Sept and Oct.2012   cataract  . HERNIA REPAIR    . PROSTATECTOMY  1994    FAMILY HISTORY :   Family History  Problem Relation Age of Onset  . Aneurysm Father   . Cancer Mother        breast    SOCIAL HISTORY:   Social History   Tobacco Use  . Smoking status: Former Smoker    Packs/day: 1.50    Years: 25.00    Pack years: 37.50    Types: Cigarettes    Quit date: 01/21/1974    Years since quitting: 45.2  . Smokeless tobacco: Never Used  Substance Use Topics  . Alcohol use: No  . Drug use: No    ALLERGIES:  has No Known Allergies.  MEDICATIONS:  Current Outpatient Medications  Medication Sig Dispense Refill  . allopurinol (ZYLOPRIM) 300 MG tablet Take 300 mg by mouth daily.     Marland Kitchen aspirin 81 MG tablet Take 81 mg by mouth daily.     Marland Kitchen atorvastatin (LIPITOR) 10 MG tablet Take 10 mg by mouth daily with supper.     . carvedilol (COREG) 6.25 MG tablet Take 1 tablet (6.25 mg total)  by mouth 2 (two) times daily with a meal. 60 tablet 2  . chlorthalidone (HYGROTON) 25 MG tablet Take 25 mg by mouth daily.     . famotidine (PEPCID) 40 MG tablet     . fentaNYL (DURAGESIC) 50 MCG/HR Place 1 patch onto the skin every 3 (three) days. 10 patch 0  . ferrous sulfate 325 (65 FE) MG tablet Take 325 mg by mouth daily with breakfast.     . hydrochlorothiazide (MICROZIDE) 12.5 MG capsule Take 12.5 mg by mouth daily.    . metFORMIN (GLUCOPHAGE) 500 MG tablet Take 500 mg by mouth daily.     . mirabegron ER (MYRBETRIQ) 25 MG TB24 tablet Take 1 tablet (25 mg total) by mouth daily. 30 tablet 11  . ondansetron (ZOFRAN) 8 MG tablet Take 1 tablet (8 mg total) by mouth every 8 (eight) hours as needed for nausea or vomiting. 60 tablet 3  . oxybutynin (DITROPAN-XL) 5 MG 24 hr tablet Take 1 tablet (5  mg total) by mouth 3 (three) times daily. (Patient taking differently: Take 5 mg by mouth at bedtime. ) 90 tablet 11  . Oxycodone HCl 10 MG TABS Take 1 tablet (10 mg total) by mouth every 12 (twelve) hours as needed. for pain 60 tablet 0  . pantoprazole (PROTONIX) 40 MG tablet Take 1 tablet (40 mg total) by mouth daily before breakfast. 30 tablet 2  . triamcinolone (KENALOG) 0.025 % ointment Apply 1 application topically 2 (two) times daily. Apply to clean, dry feet as directed 80 g 0  . vitamin B-12 (CYANOCOBALAMIN) 1000 MCG tablet Take 1,000 mcg by mouth daily.    . vitamin C (ASCORBIC ACID) 500 MG tablet Take 500 mg by mouth daily.    . Vitamin D, Ergocalciferol, (DRISDOL) 1.25 MG (50000 UT) CAPS capsule Take 5,000 Units by mouth once a week.     Gillermina Phy 40 MG capsule TAKE 4 CAPSULES (160 MG TOTAL) BY MOUTH DAILY. 120 capsule 4  . naloxone (NARCAN) nasal spray 4 mg/0.1 mL For use with opioid overdose. 1 spray delivered by intranasal administration. Call 911 (EMS) if used. (Patient not taking: Reported on 12/25/2018) 1 kit 0   No current facility-administered medications for this visit.     PHYSICAL EXAMINATION: ECOG PERFORMANCE STATUS: 1 - Symptomatic but completely ambulatory  BP 112/72 (BP Location: Left Arm, Patient Position: Sitting)   Pulse 63   Temp 98.6 F (37 C) (Oral)   Resp 18   Wt 154 lb (69.9 kg)   SpO2 96%   BMI 21.78 kg/m   Filed Weights   04/01/19 1326  Weight: 154 lb (69.9 kg)    Physical Exam  Constitutional: He is oriented to person, place, and time and well-developed, well-nourished, and in no distress.  In wheel chair.  Alone.  Appears cachectic.  HENT:  Head: Normocephalic and atraumatic.  Mouth/Throat: Oropharynx is clear and moist. No oropharyngeal exudate.  Eyes: Pupils are equal, round, and reactive to light.  Neck: Normal range of motion. Neck supple.  Cardiovascular: Normal rate and regular rhythm.  Pulmonary/Chest: Breath sounds normal. No  respiratory distress. He has no wheezes.  Abdominal: Soft. Bowel sounds are normal. He exhibits no distension and no mass. There is no abdominal tenderness. There is no rebound and no guarding.  Musculoskeletal:        General: No tenderness or edema.  Neurological: He is alert and oriented to person, place, and time.  Skin: Skin is warm.  Psychiatric: Affect normal.  Vitals reviewed.    LABORATORY DATA:  I have reviewed the data as listed    Component Value Date/Time   NA 137 04/01/2019 1259   K 3.6 04/01/2019 1259   CL 99 04/01/2019 1259   CO2 26 04/01/2019 1259   GLUCOSE 176 (H) 04/01/2019 1259   BUN 35 (H) 04/01/2019 1259   CREATININE 1.46 (H) 04/01/2019 1259   CALCIUM 9.8 04/01/2019 1259   PROT 7.1 04/01/2019 1259   ALBUMIN 3.5 04/01/2019 1259   AST 21 04/01/2019 1259   ALT 11 04/01/2019 1259   ALKPHOS 93 04/01/2019 1259   BILITOT 0.6 04/01/2019 1259   GFRNONAA 46 (L) 04/01/2019 1259   GFRAA 53 (L) 04/01/2019 1259    No results found for: SPEP, UPEP  Lab Results  Component Value Date   WBC 5.8 04/01/2019   NEUTROABS 4.5 04/01/2019   HGB 10.1 (L) 04/01/2019   HCT 30.6 (L) 04/01/2019   MCV 96.8 04/01/2019   PLT 170 04/01/2019      Chemistry      Component Value Date/Time   NA 137 04/01/2019 1259   K 3.6 04/01/2019 1259   CL 99 04/01/2019 1259   CO2 26 04/01/2019 1259   BUN 35 (H) 04/01/2019 1259   CREATININE 1.46 (H) 04/01/2019 1259      Component Value Date/Time   CALCIUM 9.8 04/01/2019 1259   ALKPHOS 93 04/01/2019 1259   AST 21 04/01/2019 1259   ALT 11 04/01/2019 1259   BILITOT 0.6 04/01/2019 1259       RADIOGRAPHIC STUDIES: I have personally reviewed the radiological images as listed and agreed with the findings in the report. No results found.   ASSESSMENT & PLAN:  Prostate cancer metastatic to bone [facility]) # Metastatic castrate resistant prostate cancer-Lupron q 4 M and Xgeva/Xtandi; February 2020 aux PET scan-no evidence of progressive  disease in the pelvis; improving T9 lesion; right posterior rib lesion.  July 2020 PSA-5.5 stable.  #No obvious clinical progression of metastatic disease noted.  Continue Lupron/Xtandi/Xgeva.  #Weight loss currently stable.  Improved with nausea. Declines MRI brain.   #Chronic nausea unclear etiology -question brain metastases.  Declines MRI brain improved after using zofran.   # Right hip pain- status post radiation; stable.  Continue oxycodone 10 mg every 4 hours.  Also continue fentanyl patch. Stable.   # Bone mets-on Xgeva monthly.  Calcium normal.  Stable  # CKD- stage III; creatinine 1.4- STABLE.  Status post exchange of ureteral stents.   #Discussed with palliative care/wife.  # DISPOSITION:  # X-geva/ lupron today # Follow up  In 4 weeks-MD; labs- cbc/cmp;X-geva  Dr.B   Orders Placed This Encounter  Procedures  . CBC with Differential    Standing Status:   Future    Standing Expiration Date:   03/31/2020  . Comprehensive metabolic panel    Standing Status:   Future    Standing Expiration Date:   03/31/2020   All questions were answered. The patient knows to call the clinic with any problems, questions or concerns.      Cammie Sickle, MD 04/06/2019 7:59 AM

## 2019-04-01 NOTE — Assessment & Plan Note (Addendum)
#   Metastatic castrate resistant prostate cancer-Lupron q 4 M and Xgeva/Xtandi; February 2020 aux PET scan-no evidence of progressive disease in the pelvis; improving T9 lesion; right posterior rib lesion.  July 2020 PSA-5.5 stable.  #No obvious clinical progression of metastatic disease noted.  Continue Lupron/Xtandi/Xgeva.  #Weight loss currently stable.  Improved with nausea. Declines MRI brain.   #Chronic nausea unclear etiology -question brain metastases.  Declines MRI brain improved after using zofran.   # Right hip pain- status post radiation; stable.  Continue oxycodone 10 mg every 4 hours.  Also continue fentanyl patch. Stable.   # Bone mets-on Xgeva monthly.  Calcium normal.  Stable  # CKD- stage III; creatinine 1.4- STABLE.  Status post exchange of ureteral stents.   #Discussed with palliative care/wife.  # DISPOSITION:  # X-geva/ lupron today # Follow up  In 4 weeks-MD; labs- cbc/cmp;X-geva  Dr.B

## 2019-04-06 ENCOUNTER — Other Ambulatory Visit: Payer: Self-pay

## 2019-04-09 ENCOUNTER — Other Ambulatory Visit: Payer: Self-pay | Admitting: Hospice and Palliative Medicine

## 2019-04-09 ENCOUNTER — Telehealth: Payer: Self-pay | Admitting: *Deleted

## 2019-04-09 MED ORDER — OXYCODONE HCL 10 MG PO TABS: 10.0000 mg | ORAL_TABLET | Freq: Four times a day (QID) | ORAL | 0 refills | Status: DC | PRN

## 2019-04-09 NOTE — Progress Notes (Signed)
PDMP reviewed. Oxycodone refilled. Changed frequency to Q6H prn.

## 2019-04-09 NOTE — Telephone Encounter (Signed)
Patient called asking for refill of his Oxycodone 10 mg. This was refilled 8/12 for #60 tabs with directions to take 1 tablet every 12 hours as needed. Patient states he thought he could take 1 every 6 hours as needed and that id how he has been taking it. Please advise on refill.

## 2019-04-09 NOTE — Telephone Encounter (Signed)
Patient informed prescription changed and new one sent in

## 2019-04-09 NOTE — Telephone Encounter (Signed)
Changed frequency. Q6H PRN is reasonable. Refill sent.

## 2019-04-13 ENCOUNTER — Ambulatory Visit: Payer: Medicare HMO | Admitting: Urology

## 2019-04-20 ENCOUNTER — Inpatient Hospital Stay: Payer: Medicare HMO

## 2019-04-20 NOTE — Progress Notes (Signed)
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   Spoke with patient via phone for nutrition follow-up.  Patient reports that appetite is "little bit better."  Ate cereal, eggs with cheese and oatmeal for breakfast this am.  Lunch is ensure and sometimes yogurt.  Last night for supper was hot dog and onion rings (ate 100%).  Usually drinks 2-3 ensure plus or boost plus per day.     Medications: reviewed  Labs: glucose 176, BUN 35, creatinine 1.46  Anthropometrics:   Weight 154 lb on 8/12 decreased from 159 lb on 7/15   NUTRITION DIAGNOSIS:  Inadequate oral intake continues with weight loss   INTERVENTION:  Patient may benefit from trial of appetite stimulant.  Continue oral nutrition supplement 2-3 times per day.  Case of ensure plus left for patient to pick up at registration desk this pm.       MONITORING, EVALUATION, GOAL: Patient will consume adequate calories and protein to prevent further weight loss.    NEXT VISIT: phone f/u October 12  [name] B. Zenia Resides, [place], [place] Registered Dietitian [phone] (pager)

## 2019-04-29 ENCOUNTER — Telehealth: Payer: Self-pay | Admitting: *Deleted

## 2019-04-29 ENCOUNTER — Emergency Department: Payer: Medicare HMO

## 2019-04-29 ENCOUNTER — Other Ambulatory Visit: Payer: Self-pay

## 2019-04-29 ENCOUNTER — Inpatient Hospital Stay
Admission: EM | Admit: 2019-04-29 | Discharge: 2019-05-07 | DRG: 377 | Disposition: A | Discharge status: Hospice | Payer: Medicare HMO | Attending: Internal Medicine | Admitting: Internal Medicine

## 2019-04-29 DIAGNOSIS — R296 Repeated falls: Secondary | ICD-10-CM | POA: Diagnosis present

## 2019-04-29 DIAGNOSIS — H9190 Unspecified hearing loss, unspecified ear: Secondary | ICD-10-CM | POA: Diagnosis present

## 2019-04-29 DIAGNOSIS — R64 Cachexia: Secondary | ICD-10-CM | POA: Diagnosis present

## 2019-04-29 DIAGNOSIS — Z681 Body mass index (BMI) 19 or less, adult: Secondary | ICD-10-CM | POA: Diagnosis not present

## 2019-04-29 DIAGNOSIS — F0390 Unspecified dementia without behavioral disturbance: Secondary | ICD-10-CM | POA: Diagnosis present

## 2019-04-29 DIAGNOSIS — C61 Malignant neoplasm of prostate: Secondary | ICD-10-CM | POA: Diagnosis present

## 2019-04-29 DIAGNOSIS — R634 Abnormal weight loss: Secondary | ICD-10-CM | POA: Diagnosis not present

## 2019-04-29 DIAGNOSIS — G893 Neoplasm related pain (acute) (chronic): Secondary | ICD-10-CM | POA: Diagnosis present

## 2019-04-29 DIAGNOSIS — K635 Polyp of colon: Secondary | ICD-10-CM | POA: Diagnosis present

## 2019-04-29 DIAGNOSIS — R531 Weakness: Secondary | ICD-10-CM

## 2019-04-29 DIAGNOSIS — M109 Gout, unspecified: Secondary | ICD-10-CM | POA: Diagnosis present

## 2019-04-29 DIAGNOSIS — G8928 Other chronic postprocedural pain: Secondary | ICD-10-CM | POA: Diagnosis not present

## 2019-04-29 DIAGNOSIS — I130 Hypertensive heart and chronic kidney disease with heart failure and stage 1 through stage 4 chronic kidney disease, or unspecified chronic kidney disease: Secondary | ICD-10-CM | POA: Diagnosis present

## 2019-04-29 DIAGNOSIS — E43 Unspecified severe protein-calorie malnutrition: Secondary | ICD-10-CM | POA: Diagnosis present

## 2019-04-29 DIAGNOSIS — Z7984 Long term (current) use of oral hypoglycemic drugs: Secondary | ICD-10-CM

## 2019-04-29 DIAGNOSIS — Z20828 Contact with and (suspected) exposure to other viral communicable diseases: Secondary | ICD-10-CM | POA: Diagnosis present

## 2019-04-29 DIAGNOSIS — Z923 Personal history of irradiation: Secondary | ICD-10-CM

## 2019-04-29 DIAGNOSIS — Z89421 Acquired absence of other right toe(s): Secondary | ICD-10-CM

## 2019-04-29 DIAGNOSIS — C7951 Secondary malignant neoplasm of bone: Secondary | ICD-10-CM | POA: Diagnosis present

## 2019-04-29 DIAGNOSIS — K2970 Gastritis, unspecified, without bleeding: Secondary | ICD-10-CM | POA: Diagnosis present

## 2019-04-29 DIAGNOSIS — N183 Chronic kidney disease, stage 3 (moderate): Secondary | ICD-10-CM | POA: Diagnosis present

## 2019-04-29 DIAGNOSIS — D62 Acute posthemorrhagic anemia: Secondary | ICD-10-CM | POA: Diagnosis present

## 2019-04-29 DIAGNOSIS — Z9079 Acquired absence of other genital organ(s): Secondary | ICD-10-CM

## 2019-04-29 DIAGNOSIS — Z515 Encounter for palliative care: Secondary | ICD-10-CM | POA: Diagnosis not present

## 2019-04-29 DIAGNOSIS — I252 Old myocardial infarction: Secondary | ICD-10-CM

## 2019-04-29 DIAGNOSIS — Z66 Do not resuscitate: Secondary | ICD-10-CM | POA: Diagnosis present

## 2019-04-29 DIAGNOSIS — Z79899 Other long term (current) drug therapy: Secondary | ICD-10-CM

## 2019-04-29 DIAGNOSIS — D638 Anemia in other chronic diseases classified elsewhere: Secondary | ICD-10-CM | POA: Diagnosis present

## 2019-04-29 DIAGNOSIS — Z23 Encounter for immunization: Secondary | ICD-10-CM | POA: Diagnosis not present

## 2019-04-29 DIAGNOSIS — Z7982 Long term (current) use of aspirin: Secondary | ICD-10-CM

## 2019-04-29 DIAGNOSIS — I350 Nonrheumatic aortic (valve) stenosis: Secondary | ICD-10-CM | POA: Diagnosis present

## 2019-04-29 DIAGNOSIS — K921 Melena: Principal | ICD-10-CM | POA: Diagnosis present

## 2019-04-29 DIAGNOSIS — K219 Gastro-esophageal reflux disease without esophagitis: Secondary | ICD-10-CM | POA: Diagnosis present

## 2019-04-29 DIAGNOSIS — R4182 Altered mental status, unspecified: Secondary | ICD-10-CM | POA: Diagnosis not present

## 2019-04-29 DIAGNOSIS — E1122 Type 2 diabetes mellitus with diabetic chronic kidney disease: Secondary | ICD-10-CM | POA: Diagnosis present

## 2019-04-29 DIAGNOSIS — D649 Anemia, unspecified: Secondary | ICD-10-CM | POA: Diagnosis not present

## 2019-04-29 DIAGNOSIS — Z539 Procedure and treatment not carried out, unspecified reason: Secondary | ICD-10-CM | POA: Diagnosis not present

## 2019-04-29 DIAGNOSIS — E7800 Pure hypercholesterolemia, unspecified: Secondary | ICD-10-CM | POA: Diagnosis present

## 2019-04-29 DIAGNOSIS — Z960 Presence of urogenital implants: Secondary | ICD-10-CM | POA: Diagnosis present

## 2019-04-29 DIAGNOSIS — D509 Iron deficiency anemia, unspecified: Secondary | ICD-10-CM | POA: Diagnosis not present

## 2019-04-29 DIAGNOSIS — Z809 Family history of malignant neoplasm, unspecified: Secondary | ICD-10-CM

## 2019-04-29 DIAGNOSIS — Z9049 Acquired absence of other specified parts of digestive tract: Secondary | ICD-10-CM

## 2019-04-29 DIAGNOSIS — Z87442 Personal history of urinary calculi: Secondary | ICD-10-CM

## 2019-04-29 DIAGNOSIS — Z192 Hormone resistant malignancy status: Secondary | ICD-10-CM | POA: Diagnosis not present

## 2019-04-29 DIAGNOSIS — I5032 Chronic diastolic (congestive) heart failure: Secondary | ICD-10-CM | POA: Diagnosis present

## 2019-04-29 DIAGNOSIS — Z87891 Personal history of nicotine dependence: Secondary | ICD-10-CM

## 2019-04-29 DIAGNOSIS — K314 Gastric diverticulum: Secondary | ICD-10-CM | POA: Diagnosis present

## 2019-04-29 LAB — GLUCOSE, CAPILLARY
Glucose-Capillary: 110 mg/dL — ABNORMAL HIGH (ref 70–99)
Glucose-Capillary: 119 mg/dL — ABNORMAL HIGH (ref 70–99)

## 2019-04-29 LAB — CBC WITH DIFFERENTIAL/PLATELET
Abs Immature Granulocytes: 0.08 10*3/uL — ABNORMAL HIGH (ref 0.00–0.07)
Basophils Absolute: 0.1 10*3/uL (ref 0.0–0.1)
Basophils Relative: 1 %
Eosinophils Absolute: 0.1 10*3/uL (ref 0.0–0.5)
Eosinophils Relative: 1 %
HCT: 18.5 % — ABNORMAL LOW (ref 39.0–52.0)
Hemoglobin: 6 g/dL — ABNORMAL LOW (ref 13.0–17.0)
Immature Granulocytes: 1 %
Lymphocytes Relative: 11 %
Lymphs Abs: 0.8 10*3/uL (ref 0.7–4.0)
MCH: 32.8 pg (ref 26.0–34.0)
MCHC: 32.4 g/dL (ref 30.0–36.0)
MCV: 101.1 fL — ABNORMAL HIGH (ref 80.0–100.0)
Monocytes Absolute: 0.6 10*3/uL (ref 0.1–1.0)
Monocytes Relative: 8 %
Neutro Abs: 5.6 10*3/uL (ref 1.7–7.7)
Neutrophils Relative %: 78 %
Platelets: 204 10*3/uL (ref 150–400)
RBC: 1.83 MIL/uL — ABNORMAL LOW (ref 4.22–5.81)
RDW: 15.9 % — ABNORMAL HIGH (ref 11.5–15.5)
WBC: 7.2 10*3/uL (ref 4.0–10.5)
nRBC: 0 % (ref 0.0–0.2)

## 2019-04-29 LAB — PREPARE RBC (CROSSMATCH)

## 2019-04-29 LAB — MAGNESIUM: Magnesium: 1.9 mg/dL (ref 1.7–2.4)

## 2019-04-29 LAB — COMPREHENSIVE METABOLIC PANEL
ALT: 13 U/L (ref 0–44)
AST: 31 U/L (ref 15–41)
Albumin: 3.1 g/dL — ABNORMAL LOW (ref 3.5–5.0)
Alkaline Phosphatase: 70 U/L (ref 38–126)
Anion gap: 12 (ref 5–15)
BUN: 88 mg/dL — ABNORMAL HIGH (ref 8–23)
CO2: 23 mmol/L (ref 22–32)
Calcium: 10 mg/dL (ref 8.9–10.3)
Chloride: 108 mmol/L (ref 98–111)
Creatinine, Ser: 1.72 mg/dL — ABNORMAL HIGH (ref 0.61–1.24)
GFR calc Af Amer: 43 mL/min — ABNORMAL LOW (ref >60)
GFR calc non Af Amer: 38 mL/min — ABNORMAL LOW (ref >60)
Glucose, Bld: 146 mg/dL — ABNORMAL HIGH (ref 70–99)
Potassium: 3.4 mmol/L — ABNORMAL LOW (ref 3.5–5.1)
Sodium: 143 mmol/L (ref 135–145)
Total Bilirubin: 0.5 mg/dL (ref 0.3–1.2)
Total Protein: 6 g/dL — ABNORMAL LOW (ref 6.5–8.1)

## 2019-04-29 LAB — URINALYSIS, COMPLETE (UACMP) WITH MICROSCOPIC
Bacteria, UA: NONE SEEN
Bilirubin Urine: NEGATIVE
Glucose, UA: NEGATIVE mg/dL
Ketones, ur: NEGATIVE mg/dL
Leukocytes,Ua: NEGATIVE
Nitrite: NEGATIVE
Protein, ur: NEGATIVE mg/dL
Specific Gravity, Urine: 1.015 (ref 1.005–1.030)
Squamous Epithelial / HPF: NONE SEEN (ref 0–5)
pH: 5 (ref 5.0–8.0)

## 2019-04-29 LAB — FERRITIN: Ferritin: 209 ng/mL (ref 24–336)

## 2019-04-29 LAB — IRON AND TIBC
Iron: 50 ug/dL (ref 45–182)
Saturation Ratios: 15 % — ABNORMAL LOW (ref 17.9–39.5)
TIBC: 340 ug/dL (ref 250–450)
UIBC: 290 ug/dL

## 2019-04-29 LAB — SARS CORONAVIRUS 2 BY RT PCR (HOSPITAL ORDER, PERFORMED IN ~~LOC~~ HOSPITAL LAB): SARS Coronavirus 2: NEGATIVE

## 2019-04-29 LAB — ABO/RH: ABO/RH(D): A POS

## 2019-04-29 LAB — HEMOGLOBIN AND HEMATOCRIT, BLOOD
HCT: 21.1 % — ABNORMAL LOW (ref 39.0–52.0)
Hemoglobin: 7 g/dL — ABNORMAL LOW (ref 13.0–17.0)

## 2019-04-29 MED ORDER — SODIUM CHLORIDE 0.9 % IV SOLN
10.0000 mL/h | Freq: Once | INTRAVENOUS | Status: AC
  Administered 2019-04-29: 10 mL/h via INTRAVENOUS

## 2019-04-29 MED ORDER — ATORVASTATIN CALCIUM 10 MG PO TABS
10.0000 mg | ORAL_TABLET | Freq: Every day | ORAL | Status: DC
  Administered 2019-04-30 – 2019-05-06 (×7): 10 mg via ORAL
  Filled 2019-04-29 (×7): qty 1

## 2019-04-29 MED ORDER — ALLOPURINOL 100 MG PO TABS
300.0000 mg | ORAL_TABLET | Freq: Every day | ORAL | Status: DC
  Administered 2019-04-30 – 2019-05-07 (×7): 300 mg via ORAL
  Filled 2019-04-29 (×7): qty 3

## 2019-04-29 MED ORDER — INSULIN ASPART 100 UNIT/ML ~~LOC~~ SOLN
0.0000 [IU] | Freq: Every day | SUBCUTANEOUS | Status: DC
  Filled 2019-04-29: qty 1

## 2019-04-29 MED ORDER — PANTOPRAZOLE SODIUM 40 MG PO TBEC: 40.0000 mg | DELAYED_RELEASE_TABLET | Freq: Two times a day (BID) | ORAL | Status: DC

## 2019-04-29 MED ORDER — CARVEDILOL 6.25 MG PO TABS
6.2500 mg | ORAL_TABLET | Freq: Two times a day (BID) | ORAL | Status: DC
  Administered 2019-04-30 – 2019-05-01 (×2): 6.25 mg via ORAL
  Filled 2019-04-29 (×4): qty 1

## 2019-04-29 MED ORDER — ENSURE ENLIVE PO LIQD: 237.0000 mL | Freq: Two times a day (BID) | ORAL | Status: DC

## 2019-04-29 MED ORDER — ACETAMINOPHEN 325 MG PO TABS
650.0000 mg | ORAL_TABLET | Freq: Four times a day (QID) | ORAL | Status: DC | PRN
  Administered 2019-04-30 – 2019-05-03 (×5): 650 mg via ORAL
  Filled 2019-04-29 (×5): qty 2

## 2019-04-29 MED ORDER — PANTOPRAZOLE SODIUM 40 MG PO TBEC
40.0000 mg | DELAYED_RELEASE_TABLET | Freq: Every day | ORAL | Status: DC
  Administered 2019-04-30 – 2019-05-07 (×6): 40 mg via ORAL
  Filled 2019-04-29 (×6): qty 1

## 2019-04-29 MED ORDER — INSULIN ASPART 100 UNIT/ML ~~LOC~~ SOLN
0.0000 [IU] | Freq: Three times a day (TID) | SUBCUTANEOUS | Status: DC
  Administered 2019-04-30 (×2): 1 [IU] via SUBCUTANEOUS
  Administered 2019-05-01 (×2): 2 [IU] via SUBCUTANEOUS
  Administered 2019-05-03 – 2019-05-06 (×3): 1 [IU] via SUBCUTANEOUS
  Administered 2019-05-06: 2 [IU] via SUBCUTANEOUS
  Administered 2019-05-07: 13:00:00 1 [IU] via SUBCUTANEOUS
  Filled 2019-04-29 (×10): qty 1

## 2019-04-29 MED ORDER — POLYETHYLENE GLYCOL 3350 17 G PO PACK: 17.0000 g | PACK | Freq: Every day | ORAL | Status: DC | PRN

## 2019-04-29 MED ORDER — PANTOPRAZOLE SODIUM 40 MG IV SOLR
40.0000 mg | Freq: Once | INTRAVENOUS | Status: AC
  Administered 2019-04-29: 19:00:00 40 mg via INTRAVENOUS
  Filled 2019-04-29: qty 40

## 2019-04-29 MED ORDER — MIRABEGRON ER 25 MG PO TB24
25.0000 mg | ORAL_TABLET | Freq: Every day | ORAL | Status: DC
  Filled 2019-04-29: qty 1

## 2019-04-29 MED ORDER — OXYBUTYNIN CHLORIDE ER 5 MG PO TB24
15.0000 mg | ORAL_TABLET | Freq: Every day | ORAL | Status: DC
  Administered 2019-04-29: 15 mg via ORAL
  Filled 2019-04-29: qty 3

## 2019-04-29 MED ORDER — ACETAMINOPHEN 650 MG RE SUPP: 650.0000 mg | Freq: Four times a day (QID) | RECTAL | Status: DC | PRN

## 2019-04-29 MED ORDER — BISACODYL 5 MG PO TBEC
10.0000 mg | DELAYED_RELEASE_TABLET | Freq: Once | ORAL | Status: AC
  Administered 2019-04-30: 10 mg via ORAL
  Filled 2019-04-29: qty 2

## 2019-04-29 NOTE — H&P (Signed)
[facility] [place] NAME: [name]    MR#:  [id number]  DATE OF BIRTH:  [birth year]  DATE OF ADMISSION:  04/29/2019  PRIMARY CARE PHYSICIAN: Tamsen Roers, MD   REQUESTING/REFERRING PHYSICIAN: Dr. Corky Downs  CHIEF COMPLAINT:   Chief Complaint  Patient presents with  . Weakness    HISTORY OF PRESENT ILLNESS:  [name]  is a 78 y.o. male with a known history of prostate cancer, hypertension, diabetes mellitus, atrial flutter, appetite loss presents to the hospital due to weakness and recurrent falls with bruising all over.  Patient in the ER was found to have anemia with hemoglobin of 6.  Stool Hemoccult test positive.  No frank bleeding.  Patient will be transfused blood and admitted to the hospitalist service for further work-up. Patient has also had memory problems over the last few days according to the wife.  Ambulates with a walker at baseline.  History obtained mainly from wife.  Old records reviewed.  He has had decreased appetite for the past few months.  He has lost 18 pounds just in the last 1 month.  PAST MEDICAL HISTORY:   Past Medical History:  Diagnosis Date  . Aortic stenosis, moderate 01/30/2014  . Appetite loss   . Arthritis   . Atrial flutter ([place]) 02/02/2014  . Back pain   . Diabetes mellitus without complication (HCC)    diet control  . GERD (gastroesophageal reflux disease)   . Gout   . Heart murmur   . Heart murmur   . History of kidney stones   . HTN (hypertension) 01/30/2014  . Hypercholesteremia   . Hypertension   . Prostate cancer ([place])    metastatic  . Weakness of left leg     PAST SURGICAL HISTORY:   Past Surgical History:  Procedure Laterality Date  . 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 CATARACT EXTRACTION W/ INTRAOCULAR LENS IMPLANT Bilateral   . COLON RESECTION    . CYSTOSCOPY  W/ RETROGRADES Left 06/08/2015   Procedure: CYSTOSCOPY WITH RETROGRADE PYELOGRAM;  Surgeon: Nickie Retort, MD;  Location: [facility];  Service: Urology;  Laterality: Left;  . CYSTOSCOPY W/ RETROGRADES Left 05/13/2018   Procedure: CYSTOSCOPY WITH RETROGRADE PYELOGRAM;  Surgeon: Abbie Sons, MD;  Location: [facility];  Service: Urology;  Laterality: Left;  . CYSTOSCOPY W/ RETROGRADES Left 02/03/2019   Procedure: CYSTOSCOPY WITH RETROGRADE PYELOGRAM;  Surgeon: Abbie Sons, MD;  Location: [facility];  Service: Urology;  Laterality: Left;  . CYSTOSCOPY W/ URETERAL STENT PLACEMENT Left 10/05/2015   Procedure: CYSTOSCOPY WITH STENT REPLACEMENT;  Surgeon: Nickie Retort, MD;  Location: [facility];  Service: Urology;  Laterality: Left;  . CYSTOSCOPY W/ URETERAL STENT PLACEMENT Left 01/04/2016   Procedure: CYSTOSCOPY WITH STENT REPLACEMENT;  Surgeon: Nickie Retort, MD;  Location: [facility];  Service: Urology;  Laterality: Left;  . CYSTOSCOPY W/ URETERAL STENT PLACEMENT Left 04/11/2016   Procedure: CYSTOSCOPY WITH STENT REPLACEMENT;  Surgeon: Nickie Retort, MD;  Location: [facility];  Service: Urology;  Laterality: Left;  . CYSTOSCOPY W/ URETERAL STENT PLACEMENT Left 07/18/2016   Procedure: CYSTOSCOPY WITH STENT REPLACEMENT;  Surgeon: Nickie Retort, MD;  Location: [facility];  Service: Urology;  Laterality: Left;  . CYSTOSCOPY W/ URETERAL STENT PLACEMENT Left 11/02/2016   Procedure: CYSTOSCOPY WITH STENT REPLACEMENT;  Surgeon: Horald Pollen  Pilar Jarvis, MD;  Location: [facility];  Service: Urology;  Laterality: Left;  . CYSTOSCOPY W/ URETERAL STENT PLACEMENT Left 02/08/2017   Procedure: CYSTOSCOPY WITH STENT REPLACEMENT;  Surgeon: Nickie Retort, MD;  Location: [facility];  Service: Urology;  Laterality: Left;  . CYSTOSCOPY W/ URETERAL STENT PLACEMENT Left 05/10/2017   Procedure: CYSTOSCOPY WITH STENT REPLACEMENT;  Surgeon: Nickie Retort, MD;  Location: [facility];  Service: Urology;  Laterality:  Left;  . CYSTOSCOPY W/ URETERAL STENT PLACEMENT Left 05/13/2018   Procedure: CYSTOSCOPY WITH STENT Exchange;  Surgeon: Abbie Sons, MD;  Location: [facility];  Service: Urology;  Laterality: Left;  . CYSTOSCOPY W/ URETERAL STENT PLACEMENT Left 02/03/2019   Procedure: CYSTOSCOPY WITH STENT REPLACEMENT;  Surgeon: Abbie Sons, MD;  Location: [facility];  Service: Urology;  Laterality: Left;  . CYSTOSCOPY WITH STENT PLACEMENT Left 08/16/2017   Procedure: CYSTOSCOPY WITH STENT EXCHANGE;  Surgeon: Abbie Sons, MD;  Location: [facility];  Service: Urology;  Laterality: Left;  . CYSTOSCOPY WITH STENT PLACEMENT Left 12/17/2017   Procedure: CYSTOSCOPY WITH STENT EXCHANGE;  Surgeon: Abbie Sons, MD;  Location: [facility];  Service: Urology;  Laterality: Left;  . EYE SURGERY Bilateral Sept and Oct.2012   cataract  . HERNIA REPAIR    . PROSTATECTOMY  1994    SOCIAL HISTORY:   Social History   Tobacco Use  . Smoking status: Former Smoker    Packs/day: 1.50    Years: 25.00    Pack years: 37.50    Types: Cigarettes    Quit date: 01/21/1974    Years since quitting: 45.2  . Smokeless tobacco: Never Used  Substance Use Topics  . Alcohol use: No    FAMILY HISTORY:   Family History  Problem Relation Age of Onset  . Aneurysm Father   . Cancer Mother        breast    DRUG ALLERGIES:  No Known Allergies  REVIEW OF SYSTEMS:   Review of Systems  Unable to perform ROS: Mental acuity    MEDICATIONS AT HOME:   Prior to Admission medications   Medication Sig Start Date End Date Taking? Authorizing Provider  acetaminophen (TYLENOL) 500 MG tablet Take 500 mg by mouth 3 (three) times daily.   Yes [provider]  allopurinol (ZYLOPRIM) 300 MG tablet Take 300 mg by mouth daily.  01/07/14  Yes [provider]  aspirin 81 MG tablet Take 81 mg by mouth daily.    Yes [provider]  atorvastatin (LIPITOR) 10 MG tablet Take 10 mg by mouth daily with supper.    Yes  [provider]  carvedilol (COREG) 6.25 MG tablet Take 1 tablet (6.25 mg total) by mouth 2 (two) times daily with a meal. 02/11/14  Yes Reyne Dumas, MD  chlorthalidone (HYGROTON) 25 MG tablet Take 25 mg by mouth daily.  07/18/17  Yes [provider]  famotidine (PEPCID) 40 MG tablet Take 40 mg by mouth daily.  03/23/19  Yes [provider]  fentaNYL (DURAGESIC) 50 MCG/HR Place 1 patch onto the skin every 3 (three) days. 04/01/19  Yes Cammie Sickle, MD  ferrous sulfate 325 (65 FE) MG tablet Take 325 mg by mouth daily with breakfast.    Yes [provider]  hydrochlorothiazide (MICROZIDE) 12.5 MG capsule Take 12.5 mg by mouth daily.   Yes [provider]  metFORMIN (GLUCOPHAGE) 500 MG tablet Take 500 mg by mouth daily.  09/30/18  Yes [provider]  mirabegron ER (MYRBETRIQ) 25 MG TB24 tablet Take 1 tablet (25 mg total) by mouth daily. 10/28/18  Yes Stoioff, Ronda Fairly, MD  ondansetron (ZOFRAN) 8 MG tablet Take 1 tablet (8 mg total) by mouth every 8 (eight) hours as needed for nausea or vomiting. 02/18/19  Yes Borders, Kirt Boys, NP  oxybutynin (DITROPAN-XL) 5 MG 24 hr tablet Take 1 tablet (5 mg total) by mouth 3 (three) times daily. Patient taking differently: Take 15 mg by mouth at bedtime.  09/08/18  Yes Stoioff, Ronda Fairly, MD  pantoprazole (PROTONIX) 40 MG tablet Take 1 tablet (40 mg total) by mouth daily before breakfast. 02/11/14  Yes Reyne Dumas, MD  triamcinolone (KENALOG) 0.025 % ointment Apply 1 application topically 2 (two) times daily. Apply to clean, dry feet as directed 12/25/18  Yes Verlon Au, NP  vitamin B-12 (CYANOCOBALAMIN) 1000 MCG tablet Take 1,000 mcg by mouth daily.   Yes [provider]  vitamin C (ASCORBIC ACID) 500 MG tablet Take 500 mg by mouth daily.   Yes [provider]  Vitamin D, Ergocalciferol, (DRISDOL) 1.25 MG (50000 UT) CAPS capsule Take 5,000 Units by mouth once a week.    Yes [provider]  XTANDI 40 MG capsule TAKE 4 CAPSULES (160 MG TOTAL) BY MOUTH DAILY. 11/18/18  Yes Cammie Sickle, MD     VITAL SIGNS:  Blood pressure 118/73, pulse 89, temperature 98.6 F (37 C), temperature source Oral, resp. rate 17, height 6' (1.829 m), weight 70 kg, SpO2 100 %.  PHYSICAL EXAMINATION:  Physical Exam  GENERAL:  78 y.o.-year-old patient lying in the bed with no acute distress.  EYES: Pupils equal, round, reactive to light and accommodation. No scleral icterus. Extraocular muscles intact.  HEENT: Head atraumatic, normocephalic. Oropharynx and nasopharynx clear. No oropharyngeal erythema, moist oral mucosa. pallor positive NECK:  Supple, no jugular venous distention. No thyroid enlargement, no tenderness.  LUNGS: Normal breath sounds bilaterally, no wheezing, rales, rhonchi. No use of accessory muscles of respiration.  CARDIOVASCULAR: S1, S2 normal. No murmurs, rubs, or gallops.  ABDOMEN: Soft, nontender, nondistended. Bowel sounds present. No organomegaly or mass.  EXTREMITIES: No pedal edema, cyanosis, or clubbing. + 2 pedal & radial pulses b/l.   NEUROLOGIC: Cranial nerves II through XII are intact. No focal Motor or sensory deficits appreciated b/l PSYCHIATRIC: The patient is alert and oriented x 2.  SKIN: No obvious rash, lesion, or ulcer.   LABORATORY PANEL:   CBC Recent Labs  Lab 04/29/19 1422  WBC 7.2  HGB 6.0*  HCT 18.5*  PLT 204   ------------------------------------------------------------------------------------------------------------------  Chemistries  Recent Labs  Lab 04/29/19 1422  NA 143  K 3.4*  CL 108  CO2 23  GLUCOSE 146*  BUN 88*  CREATININE 1.72*  CALCIUM 10.0  MG 1.9  AST 31  ALT 13  ALKPHOS 70  BILITOT 0.5   ------------------------------------------------------------------------------------------------------------------  Cardiac Enzymes No results for input(s): TROPONINI in the last 168  hours. ------------------------------------------------------------------------------------------------------------------  RADIOLOGY:  Ct Head Wo Contrast  Result Date: 04/29/2019 CLINICAL DATA:  Head trauma with headache. EXAM: CT HEAD WITHOUT CONTRAST TECHNIQUE: Contiguous axial images were obtained from the base of the skull through the vertex without intravenous contrast. COMPARISON:  None. FINDINGS: Brain: No evidence of acute infarction, hemorrhage, hydrocephalus, extra-axial collection or mass lesion/mass effect. Prominent brain parenchymal volume loss. Moderate deep white matter microangiopathy. Vascular: Calcific atherosclerotic disease. Skull: Normal. Negative for fracture or focal lesion. Sinuses/Orbits: No acute finding. Other: None. IMPRESSION: 1.  No acute intracranial abnormality. 2. Atrophy, chronic microvascular disease. Electronically Signed   By: Fidela Salisbury M.D.   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  Likely multifactorial with chronic GI losses, diffuse bruising all over and his prostate cancer and CKD stage III. Will check iron studies and B12 level.  Consult GI.  Sent message to Dr. Alice Reichert. Start Protonix. Repeat hemoglobin after blood transfusion.  *CKD stage III.  Monitor.  Avoid nephrotoxic medications.  *Hypertension.  Continue home medications  *Diabetes mellitus.  Sliding scale insulin.  *Severe protein calorie malnutrition Add dietary supplements.  Consult dietitian  *DVT prophylaxis with SCDs  All the records are reviewed and case discussed with ED provider. Management plans discussed with the patient, family and they are in agreement.  CODE STATUS: Partial code  TOTAL TIME TAKING CARE OF THIS PATIENT: 40 minutes.   Leia Alf Shaynna Husby M.D on 04/29/2019 at 5:01 PM  Between 7am to 6pm - Pager - [phone]  After 6pm go to [email or web address] - password [email or web address] [facility]  Office   [phone]  CC: Primary care physician; Tamsen Roers, MD  Note: This dictation was prepared with Dragon dictation along with smaller phrase technology. Any transcriptional errors that result from this process are unintentional.

## 2019-04-29 NOTE — ED Notes (Signed)
This RN called floor again to try and give report. Per floor, room still being cleaned.

## 2019-04-29 NOTE — ED Triage Notes (Signed)
Pt comes EMS from home with generalized weakness. Pt has had mulitple falls over the past few days. Pt had syncopal episode with EMS lasting about 30 seconds. Pt is stage 4 prostate cancer. Pt has not been taken his chemo orally for the past week. Pt has had about 562ml NS with EMS. AOx4. VSS.

## 2019-04-29 NOTE — ED Notes (Signed)
Pt leaving for imaging. Will completed covid-swab once back. Pt and wife briefly updated. Wife shown to ED room 6.

## 2019-04-29 NOTE — ED Notes (Signed)
ED TO INPATIENT HANDOFF REPORT  ED Nurse Name and Phone #: Metta Clines [phone]  S Name/Age/Gender [name] 78 y.o. male Room/Bed: ED06A/ED06A  Code Status   Code Status: Partial Code  Home/SNF/Other Home Patient oriented to: self, place, time and situation Is this baseline? Yes   Triage Complete: Triage complete  Chief Complaint weakness  Triage Note Pt comes EMS from home with generalized weakness. Pt has had mulitple falls over the past few days. Pt had syncopal episode with EMS lasting about 30 seconds. Pt is stage 4 prostate cancer. Pt has not been taken his chemo orally for the past week. Pt has had about 575ml NS with EMS. AOx4. VSS.    Allergies No Known Allergies  Level of Care/Admitting Diagnosis ED Disposition    ED Disposition Condition [facility] Area: [place] [100120]  Level of Care: Med-Surg [16]  Covid Evaluation: Asymptomatic Screening Protocol (No Symptoms)  Diagnosis: Anemia [id number]  Admitting Physician: Hillary Bow [id number]  Attending Physician: Hillary Bow [id number]  Estimated length of stay: past midnight tomorrow  Certification:: I certify this patient will need inpatient services for at least 2 midnights  PT Class (Do Not Modify): Inpatient [101]  PT Acc Code (Do Not Modify): Private [1]       B Medical/Surgery History Past Medical History:  Diagnosis Date  . Aortic stenosis, moderate 01/30/2014  . Appetite loss   . Arthritis   . Atrial flutter ([place]) 02/02/2014  . Back pain   . Diabetes mellitus without complication (HCC)    diet control  . GERD (gastroesophageal reflux disease)   . Gout   . Heart murmur   . Heart murmur   . History of kidney stones   . HTN (hypertension) 01/30/2014  . Hypercholesteremia   . Hypertension   . Prostate cancer ([place])    metastatic  . Weakness of left leg    Past Surgical History:  Procedure Laterality Date  . 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 CATARACT EXTRACTION W/ INTRAOCULAR LENS IMPLANT Bilateral   . COLON RESECTION    . CYSTOSCOPY W/ RETROGRADES Left 06/08/2015   Procedure: CYSTOSCOPY WITH RETROGRADE PYELOGRAM;  Surgeon: Nickie Retort, MD;  Location: [facility];  Service: Urology;  Laterality: Left;  . CYSTOSCOPY W/ RETROGRADES Left 05/13/2018   Procedure: CYSTOSCOPY WITH RETROGRADE PYELOGRAM;  Surgeon: Abbie Sons, MD;  Location: [facility];  Service: Urology;  Laterality: Left;  . CYSTOSCOPY W/ RETROGRADES Left 02/03/2019   Procedure: CYSTOSCOPY WITH RETROGRADE PYELOGRAM;  Surgeon: Abbie Sons, MD;  Location: [facility];  Service: Urology;  Laterality: Left;  . CYSTOSCOPY W/ URETERAL STENT PLACEMENT Left 10/05/2015   Procedure: CYSTOSCOPY WITH STENT REPLACEMENT;  Surgeon: Nickie Retort, MD;  Location: [facility];  Service: Urology;  Laterality: Left;  . CYSTOSCOPY W/ URETERAL STENT PLACEMENT Left 01/04/2016   Procedure: CYSTOSCOPY WITH STENT REPLACEMENT;  Surgeon: Nickie Retort, MD;  Location: [facility];  Service: Urology;  Laterality: Left;  . CYSTOSCOPY W/ URETERAL STENT PLACEMENT Left 04/11/2016   Procedure: CYSTOSCOPY WITH STENT REPLACEMENT;  Surgeon: Nickie Retort, MD;  Location: [facility];  Service: Urology;  Laterality: Left;  . CYSTOSCOPY W/ URETERAL STENT PLACEMENT Left 07/18/2016   Procedure: CYSTOSCOPY WITH STENT REPLACEMENT;  Surgeon: Nickie Retort, MD;  Location: [facility];  Service: Urology;  Laterality:  Left;  . CYSTOSCOPY W/ URETERAL STENT PLACEMENT Left 11/02/2016   Procedure: CYSTOSCOPY WITH STENT REPLACEMENT;  Surgeon: Nickie Retort, MD;  Location: [facility];  Service: Urology;  Laterality: Left;  . CYSTOSCOPY W/ URETERAL STENT PLACEMENT Left 02/08/2017   Procedure: CYSTOSCOPY WITH STENT REPLACEMENT;  Surgeon: Nickie Retort, MD;  Location: [facility];  Service:  Urology;  Laterality: Left;  . CYSTOSCOPY W/ URETERAL STENT PLACEMENT Left 05/10/2017   Procedure: CYSTOSCOPY WITH STENT REPLACEMENT;  Surgeon: Nickie Retort, MD;  Location: [facility];  Service: Urology;  Laterality: Left;  . CYSTOSCOPY W/ URETERAL STENT PLACEMENT Left 05/13/2018   Procedure: CYSTOSCOPY WITH STENT Exchange;  Surgeon: Abbie Sons, MD;  Location: [facility];  Service: Urology;  Laterality: Left;  . CYSTOSCOPY W/ URETERAL STENT PLACEMENT Left 02/03/2019   Procedure: CYSTOSCOPY WITH STENT REPLACEMENT;  Surgeon: Abbie Sons, MD;  Location: [facility];  Service: Urology;  Laterality: Left;  . CYSTOSCOPY WITH STENT PLACEMENT Left 08/16/2017   Procedure: CYSTOSCOPY WITH STENT EXCHANGE;  Surgeon: Abbie Sons, MD;  Location: [facility];  Service: Urology;  Laterality: Left;  . CYSTOSCOPY WITH STENT PLACEMENT Left 12/17/2017   Procedure: CYSTOSCOPY WITH STENT EXCHANGE;  Surgeon: Abbie Sons, MD;  Location: [facility];  Service: Urology;  Laterality: Left;  . EYE SURGERY Bilateral Sept and Oct.2012   cataract  . HERNIA REPAIR    . PROSTATECTOMY  1994     A IV Location/Drains/Wounds Patient Lines/Drains/Airways Status   Active Line/Drains/Airways    Name:   Placement date:   Placement time:   Site:   Days:   Peripheral IV 04/29/19 Right Antecubital   04/29/19    1629    Antecubital   less than 1   Peripheral IV 04/29/19 Left Hand   04/29/19    1743    Hand   less than 1   Ureteral Drain/Stent Left ureter 6 Fr.   02/03/19    0802    Left ureter   85   Incision (Closed) 06/08/15 Perineum   06/08/15    0809     1421   Incision (Closed) 10/05/15 Perineum Other (Comment)   10/05/15    1026     1302   Incision (Closed) 01/04/16 Perineum Other (Comment)   01/04/16    0753     1211   Incision (Closed) 04/11/16 Perineum Other (Comment)   04/11/16    1227     1113   Incision (Closed) 07/18/16 Penis   07/18/16    0958     1015   Incision (Closed) 11/02/16 Penis Other (Comment)    11/02/16    0922     908   Incision (Closed) 02/08/17 Perineum   02/08/17    0811     810   Incision (Closed) 05/10/17 Perineum Left   05/10/17    0820     719   Incision (Closed) 08/16/17 Penis Other (Comment)   08/16/17    1014     621   Incision (Closed) 12/17/17 Perineum Other (Comment)   12/17/17    0724     498   Incision (Closed) 05/13/18 Penis   05/13/18    0901     351   Incision (Closed) 08/29/18 Foot Right   08/29/18    0940     243   Incision (Closed) 02/03/19 Penis   02/03/19    0809     85  Intake/Output Last 24 hours  Intake/Output Summary (Last 24 hours) at 04/29/2019 1943 Last data filed at 04/29/2019 1720 Gross per 24 hour  Intake 360 ml  Output -  Net 360 ml    Labs/Imaging Results for orders placed or performed during the hospital encounter of 04/29/19 (from the past 48 hour(s))  Comprehensive metabolic panel     Status: Abnormal   Collection Time: 04/29/19  2:22 PM  Result Value Ref Range   Sodium 143 135 - 145 mmol/L   Potassium 3.4 (L) 3.5 - 5.1 mmol/L   Chloride 108 98 - 111 mmol/L   CO2 23 22 - 32 mmol/L   Glucose, Bld 146 (H) 70 - 99 mg/dL   BUN 88 (H) 8 - 23 mg/dL   Creatinine, Ser 1.72 (H) 0.61 - 1.24 mg/dL   Calcium 10.0 8.9 - 10.3 mg/dL   Total Protein 6.0 (L) 6.5 - 8.1 g/dL   Albumin 3.1 (L) 3.5 - 5.0 g/dL   AST 31 15 - 41 U/L   ALT 13 0 - 44 U/L   Alkaline Phosphatase 70 38 - 126 U/L   Total Bilirubin 0.5 0.3 - 1.2 mg/dL   GFR calc non Af Amer 38 (L) >60 mL/min   GFR calc Af Amer 43 (L) >60 mL/min   Anion gap 12 5 - 15    Comment: Performed at [facility], [address] [place], [place] [address]  CBC with Differential     Status: Abnormal   Collection Time: 04/29/19  2:22 PM  Result Value Ref Range   WBC 7.2 4.0 - 10.5 K/uL   RBC 1.83 (L) 4.22 - 5.81 MIL/uL   Hemoglobin 6.0 (L) 13.0 - 17.0 g/dL   HCT 18.5 (L) 39.0 - 52.0 %   MCV 101.1 (H) 80.0 - 100.0 fL   MCH 32.8 26.0 - 34.0 pg   MCHC 32.4 30.0 - 36.0 g/dL    RDW 15.9 (H) 11.5 - 15.5 %   Platelets 204 150 - 400 K/uL   nRBC 0.0 0.0 - 0.2 %   Neutrophils Relative % 78 %   Neutro Abs 5.6 1.7 - 7.7 K/uL   Lymphocytes Relative 11 %   Lymphs Abs 0.8 0.7 - 4.0 K/uL   Monocytes Relative 8 %   Monocytes Absolute 0.6 0.1 - 1.0 K/uL   Eosinophils Relative 1 %   Eosinophils Absolute 0.1 0.0 - 0.5 K/uL   Basophils Relative 1 %   Basophils Absolute 0.1 0.0 - 0.1 K/uL   Immature Granulocytes 1 %   Abs Immature Granulocytes 0.08 (H) 0.00 - 0.07 K/uL    Comment: Performed at [facility], [address] [place], [place] [address]  Magnesium     Status: None   Collection Time: 04/29/19  2:22 PM  Result Value Ref Range   Magnesium 1.9 1.7 - 2.4 mg/dL    Comment: Performed at [facility], [address] [place], [place] [address]  ABO/Rh     Status: None   Collection Time: 04/29/19  2:22 PM  Result Value Ref Range   ABO/RH(D)      A POS Performed at [facility], [address] [place], [place] [address]   Urinalysis, Complete w Microscopic     Status: Abnormal   Collection Time: 04/29/19  2:56 PM  Result Value Ref Range   Color, Urine YELLOW (A) YELLOW   APPearance CLEAR (A) CLEAR   Specific Gravity, Urine 1.015 1.005 - 1.030  pH 5.0 5.0 - 8.0   Glucose, UA NEGATIVE NEGATIVE mg/dL   Hgb urine dipstick SMALL (A) NEGATIVE   Bilirubin Urine NEGATIVE NEGATIVE   Ketones, ur NEGATIVE NEGATIVE mg/dL   Protein, ur NEGATIVE NEGATIVE mg/dL   Nitrite NEGATIVE NEGATIVE   Leukocytes,Ua NEGATIVE NEGATIVE   RBC / HPF 21-50 0 - 5 RBC/hpf   WBC, UA 0-5 0 - 5 WBC/hpf   Bacteria, UA NONE SEEN NONE SEEN   Squamous Epithelial / LPF NONE SEEN 0 - 5    Comment: Performed at [facility], [address] [place], [place] [address]  Type and screen [place]     Status: None (Preliminary result)   Collection Time: 04/29/19  2:56 PM  Result Value Ref Range   ABO/RH(D) A POS    Antibody Screen NEG     Sample Expiration 05/02/2019,2359    Unit Number [id number]    Blood Component Type RED CELLS,LR    Unit division 00    Status of Unit ISSUED    Transfusion Status OK TO TRANSFUSE    Crossmatch Result      Compatible Performed at [facility], [address] [place], [place] [address]   Prepare RBC     Status: None   Collection Time: 04/29/19  2:56 PM  Result Value Ref Range   Order Confirmation      ORDER PROCESSED BY BLOOD BANK Performed at [facility], [address], [place], [place] [address]   SARS Coronavirus 2 [facility] order, Performed in [facility] hospital lab) Nasopharyngeal Nasopharyngeal Swab     Status: None   Collection Time: 04/29/19  3:57 PM   Specimen: Nasopharyngeal Swab  Result Value Ref Range   SARS Coronavirus 2 NEGATIVE NEGATIVE    Comment: (NOTE) If result is NEGATIVE SARS-CoV-2 target nucleic acids are NOT DETECTED. The SARS-CoV-2 RNA is generally detectable in upper and lower  respiratory specimens during the acute phase of infection. The lowest  concentration of SARS-CoV-2 viral copies this assay can detect is 250  copies / mL. A negative result does not preclude SARS-CoV-2 infection  and should not be used as the sole basis for treatment or other  patient management decisions.  A negative result may occur with  improper specimen collection / handling, submission of specimen other  than nasopharyngeal swab, presence of viral mutation(s) within the  areas targeted by this assay, and inadequate number of viral copies  (<250 copies / mL). A negative result must be combined with clinical  observations, patient history, and epidemiological information. If result is POSITIVE SARS-CoV-2 target nucleic acids are DETECTED. The SARS-CoV-2 RNA is generally detectable in upper and lower  respiratory specimens dur ing the acute phase of infection.  Positive  results are indicative of active infection with SARS-CoV-2.  Clinical   correlation with patient history and other diagnostic information is  necessary to determine patient infection status.  Positive results do  not rule out bacterial infection or co-infection with other viruses. If result is PRESUMPTIVE POSTIVE SARS-CoV-2 nucleic acids MAY BE PRESENT.   A presumptive positive result was obtained on the submitted specimen  and confirmed on repeat testing.  While 2019 novel coronavirus  (SARS-CoV-2) nucleic acids may be present in the submitted sample  additional confirmatory testing may be necessary for epidemiological  and / or clinical management purposes  to differentiate between  SARS-CoV-2 and other Sarbecovirus currently known to infect humans.  If clinically indicated additional testing  with an alternate test  methodology [phone]) is advised. The SARS-CoV-2 RNA is generally  detectable in upper and lower respiratory sp ecimens during the acute  phase of infection. The expected result is Negative. Fact Sheet for Patients:  StrictlyIdeas.no Fact Sheet for Healthcare Providers: BankingDealers.co.za This test is not yet approved or cleared by the [place] FDA and has been authorized for detection and/or diagnosis of SARS-CoV-2 by FDA under an Emergency Use Authorization (EUA).  This EUA will remain in effect (meaning this test can be used) for the duration of the COVID-19 declaration under Section 564(b)(1) of the Act, 21 U.S.C. section 360bbb-3(b)(1), unless the authorization is terminated or revoked sooner. Performed at [facility], [address] [place], [place] [address]   Iron and TIBC     Status: Abnormal   Collection Time: 04/29/19  6:56 PM  Result Value Ref Range   Iron 50 45 - 182 ug/dL   TIBC 340 250 - 450 ug/dL   Saturation Ratios 15 (L) 17.9 - 39.5 %   UIBC 290 ug/dL    Comment: Performed at [facility], [address] [place], [place] [address]  Ferritin      Status: None   Collection Time: 04/29/19  6:56 PM  Result Value Ref Range   Ferritin 209 24 - 336 ng/mL    Comment: Performed at [facility], [address] [place], [place] [address]  Glucose, capillary     Status: Abnormal   Collection Time: 04/29/19  7:03 PM  Result Value Ref Range   Glucose-Capillary 110 (H) 70 - 99 mg/dL   Ct Head Wo Contrast  Result Date: 04/29/2019 CLINICAL DATA:  Head trauma with headache. EXAM: CT HEAD WITHOUT CONTRAST TECHNIQUE: Contiguous axial images were obtained from the base of the skull through the vertex without intravenous contrast. COMPARISON:  None. FINDINGS: Brain: No evidence of acute infarction, hemorrhage, hydrocephalus, extra-axial collection or mass lesion/mass effect. Prominent brain parenchymal volume loss. Moderate deep white matter microangiopathy. Vascular: Calcific atherosclerotic disease. Skull: Normal. Negative for fracture or focal lesion. Sinuses/Orbits: No acute finding. Other: None. IMPRESSION: 1. No acute intracranial abnormality. 2. Atrophy, chronic microvascular disease. Electronically Signed   By: Fidela Salisbury M.D.   On: 04/29/2019 15:48    Pending Labs Unresulted Labs (From admission, onward)    Start     Ordered   04/30/19 [id number]  Basic metabolic panel  Tomorrow morning,   STAT     04/29/19 1659   04/30/19 0500  CBC  Tomorrow morning,   STAT     04/29/19 1659   04/29/19 2000  Hemoglobin and hematocrit, blood  ONCE - STAT,   STAT     04/29/19 1938   04/29/19 1842  Vitamin B12  Add-on,   AD     04/29/19 1841   04/29/19 1656  Hemoglobin A1c  Once,   STAT    Comments: To assess prior glycemic control    04/29/19 1659          Vitals/Pain Today's Vitals   04/29/19 1915 04/29/19 1930 04/29/19 1930 04/29/19 1933  BP: (!) 97/59 (!) 106/57    Pulse:   83 82  Resp:      Temp:   99.1 F (37.3 C)   TempSrc:   Oral   SpO2:    100%  Weight:      Height:      PainSc:        Isolation Precautions No  active isolations  Medications Medications  acetaminophen (TYLENOL) tablet 650 mg (has no administration in time range)    Or  acetaminophen (TYLENOL) suppository 650 mg (has no administration in time range)  polyethylene glycol (MIRALAX / GLYCOLAX) packet 17 g (has no administration in time range)  insulin aspart (novoLOG) injection 0-9 Units (has no administration in time range)  insulin aspart (novoLOG) injection 0-5 Units (has no administration in time range)  feeding supplement (ENSURE ENLIVE) (ENSURE ENLIVE) liquid 237 mL (has no administration in time range)  0.9 %  sodium chloride infusion (10 mL/hr Intravenous New Bag/Given 04/29/19 1734)  pantoprazole (PROTONIX) injection 40 mg (40 mg Intravenous Given 04/29/19 1856)    Mobility walks with device High fall risk   Focused Assessments Loosing blood via stool; anemia; 1st unit of blood completed; may need 2nd unit - next hbg/hct draw will determine this   R Recommendations: See Admitting Provider Note  Report given to:   Additional Notes:  2 IVs

## 2019-04-29 NOTE — ED Notes (Signed)
SST/lav tubes sent to lab.

## 2019-04-29 NOTE — ED Notes (Signed)
Admitting provider at bedside.

## 2019-04-29 NOTE — ED Notes (Signed)
Pt resting calmly in bed. Family member at bedside.

## 2019-04-29 NOTE — ED Notes (Signed)
Attending provider notified of pt's hgb/hct results. No new orders.

## 2019-04-29 NOTE — ED Notes (Signed)
Next BP taken 94/67. Will check if provider still wants blood going at 999 or if 240cc/hr will suffice. Will follow orders accordingly.

## 2019-04-29 NOTE — Telephone Encounter (Signed)
Spoke with Patient has fallen multi. Times last evening. Also hit his head. Wife reports that patient fell the other day and sustained a laceration on his forehead near his ear. Wife states that he has multiple areas of bruising on his body. Pt laying down unable to get out of bed. Discussed with Wife about the goals of care in detail. Wife would like to keep pt comfortable as much as possible. However, there are concerns if patient has brain trauma r/t to the fall. Wife reports that patient is confused and seeing objects and there is a level of disorientation to his surroundings.  Pt previously declined an MRI for work up for brain mets. Wife also spoke with Dr. Rogue Bussing. Wife will call 911 and have pt come to transport pt to ER asap.

## 2019-04-29 NOTE — Progress Notes (Signed)
Advance care planning  Purpose of Encounter Anemia  Parties in Attendance Patient and his wife who is the healthcare power of attorney  Patients Decisional capacity Patient is alert and oriented x2.  Has had some confusion.  Unable to make medical decisions.  Discussed in detail regarding EMEA, blood transfusion.  Treatment plan , prognosis discussed.  All questions answered  CODE STATUS discussed in detail.  All questions answered.  Wife wants him to be partial code.  Orders entered and CODE STATUS changed  Partial code.  DO NOT INTUBATE.  Okay to resuscitate  Time spent - 17 minutes

## 2019-04-29 NOTE — ED Notes (Signed)
Floor states that they are STAT cleaning pt's assigned room and will call this RN back once room is ready.

## 2019-04-29 NOTE — Consult Note (Signed)
[facility] GI Inpatient Consult Note   Kathline Magic, M.D.  Reason for Consult: Anemia secondary to gastrointestinal blood loss, Hemoccult positive stool   Attending Requesting Consult: Hillary Bow, MD   History of Present Illness: [name] is a 78 y.o. male with a history of metastatic prostate cancer presented to the emergency room after significant difficulty with weakness and frequent falls at home.  Baseline hemoglobin appears to be around 11 but today was checked and was 6 g.  The patient's wife, [name], accompanies the patient says that the patient is been bleeding intermittently over a month at an unknown intermittent frequency.  Appearance of the blood is "bright red".  There are no reported symptoms of abdominal pain, change in bowel habits, vomiting or inability to tolerate oral intake.  Patient reportedly has lost 16 to 18 pounds over the past month.  Patient is followed by Dr. Rogue Bussing at [facility] oncology and he is also seen palliative care.  Patient is listed as a "partial code" (DO NOT INTUBATE).  Patient's wife appears to concur with that status as the patient's CODE STATUS. The patient appears to be in some moderate discomfort and is able to answer some questions, however, is unable to concentrate long enough to give a cohesive statement.  The patient's wife helps with most of the history. In regards to GI history, the patient's wife reports that he underwent a colonoscopy greater than 7 years ago in [place] at [facility].  At that time, there was some suspicion that there was "some intestinal cancer" and patient was scheduled and underwent what appears to have been a partial colectomy.  Patient's wife then states that "there really was not any colonic cancer in there after all". Patient initially underwent a prostatectomy circa 1984 but was noted to have prostate cancer sometime around 2015.  He has been undergoing therapy since this time; the  patient's wife says that the impression from the oncologist is that "the cancer is there but stable".  Past Medical History:  Past Medical History:  Diagnosis Date  . Aortic stenosis, moderate 01/30/2014  . Appetite loss   . Arthritis   . Atrial flutter ([place]) 02/02/2014  . Back pain   . Diabetes mellitus without complication (HCC)    diet control  . GERD (gastroesophageal reflux disease)   . Gout   . Heart murmur   . Heart murmur   . History of kidney stones   . HTN (hypertension) 01/30/2014  . Hypercholesteremia   . Hypertension   . Prostate cancer ([place])    metastatic  . Weakness of left leg     Problem List: Patient Active Problem List   Diagnosis Date Noted  . Palliative care encounter 12/30/2018  . Trochanteric bursitis of left hip 07/08/2018  . Goals of care, counseling/discussion 07/05/2017  . Prostate cancer metastatic to bone ([place]) 04/11/2015  . Atrial flutter ([place]) 02/02/2014  . Dysphagia 01/30/2014  . HTN (hypertension) 01/30/2014  . Other and unspecified hyperlipidemia 01/30/2014  . Overweight 01/30/2014  . Aortic stenosis, moderate 01/30/2014  . Chronic diastolic heart failure ([place]) 01/30/2014  . NSTEMI (non-ST elevated myocardial infarction) ([place]) 01/30/2014  . Toe ulcer ([place]) 01/30/2014  . Severe sepsis ([place]) 01/14/2014  . Acute renal failure ([place]) 01/14/2014  . Metabolic acidosis [id number]  . Hyperkalemia 01/14/2014  . Thrombocytopenia ([place]) 01/14/2014  . Anemia 01/14/2014  . Aspiration pneumonia ([place]) 01/14/2014  . Diabetes mellitus ([place]) 01/14/2014    Past Surgical  History: Past Surgical History:  Procedure Laterality Date  . 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 CATARACT EXTRACTION W/ INTRAOCULAR LENS IMPLANT Bilateral   . COLON RESECTION    . CYSTOSCOPY W/ RETROGRADES Left 06/08/2015   Procedure: CYSTOSCOPY WITH RETROGRADE  PYELOGRAM;  Surgeon: Nickie Retort, MD;  Location: [facility] ORS;  Service: Urology;  Laterality: Left;  . CYSTOSCOPY W/ RETROGRADES Left 05/13/2018   Procedure: CYSTOSCOPY WITH RETROGRADE PYELOGRAM;  Surgeon: Abbie Sons, MD;  Location: [facility] ORS;  Service: Urology;  Laterality: Left;  . CYSTOSCOPY W/ RETROGRADES Left 02/03/2019   Procedure: CYSTOSCOPY WITH RETROGRADE PYELOGRAM;  Surgeon: Abbie Sons, MD;  Location: [facility] ORS;  Service: Urology;  Laterality: Left;  . CYSTOSCOPY W/ URETERAL STENT PLACEMENT Left 10/05/2015   Procedure: CYSTOSCOPY WITH STENT REPLACEMENT;  Surgeon: Nickie Retort, MD;  Location: [facility] OR[facility];  Service: Urology;  Laterality: Left;  . CYSTOSCOPY W/ URETERAL STENT PLACEMENT Left 01/04/2016   Procedure: CYSTOSCOPY WITH STENT REPLACEMENT;  Surgeon: Nickie Retort, MD;  Location: [facility] OR[facility];  Service: Urology;  Laterality: Left;  . CYSTOSCOPY W/ URETERAL STENT PLACEMENT Left 04/11/2016   Procedure: CYSTOSCOPY WITH STENT REPLACEMENT;  Surgeon: Nickie Retort, MD;  Location: [facility];  Service: Urology;  Laterality: Left;  . CYSTOSCOPY W/ URETERAL STENT PLACEMENT Left 07/18/2016   Procedure: CYSTOSCOPY WITH STENT REPLACEMENT;  Surgeon: Nickie Retort, MD;  Location: [facility] OR[facility];  Service: Urology;  Laterality: Left;  . CYSTOSCOPY W/ URETERAL STENT PLACEMENT Left 11/02/2016   Procedure: CYSTOSCOPY WITH STENT REPLACEMENT;  Surgeon: Nickie Retort, MD;  Location: [facility];  Service: Urology;  Laterality: Left;  . CYSTOSCOPY W/ URETERAL STENT PLACEMENT Left 02/08/2017   Procedure: CYSTOSCOPY WITH STENT REPLACEMENT;  Surgeon: Nickie Retort, MD;  Location: [facility];  Service: Urology;  Laterality: Left;  . CYSTOSCOPY W/ URETERAL STENT PLACEMENT Left 05/10/2017   Procedure: CYSTOSCOPY WITH STENT REPLACEMENT;  Surgeon: Nickie Retort, MD;  Location: [facility];  Service: Urology;  Laterality: Left;  . CYSTOSCOPY W/ URETERAL STENT PLACEMENT Left 05/13/2018    Procedure: CYSTOSCOPY WITH STENT Exchange;  Surgeon: Abbie Sons, MD;  Location: [facility];  Service: Urology;  Laterality: Left;  . CYSTOSCOPY W/ URETERAL STENT PLACEMENT Left 02/03/2019   Procedure: CYSTOSCOPY WITH STENT REPLACEMENT;  Surgeon: Abbie Sons, MD;  Location: [facility];  Service: Urology;  Laterality: Left;  . CYSTOSCOPY WITH STENT PLACEMENT Left 08/16/2017   Procedure: CYSTOSCOPY WITH STENT EXCHANGE;  Surgeon: Abbie Sons, MD;  Location: [facility];  Service: Urology;  Laterality: Left;  . CYSTOSCOPY WITH STENT PLACEMENT Left 12/17/2017   Procedure: CYSTOSCOPY WITH STENT EXCHANGE;  Surgeon: Abbie Sons, MD;  Location: [facility] OR[facility];  Service: Urology;  Laterality: Left;  . EYE SURGERY Bilateral Sept and Oct.2012   cataract  . HERNIA REPAIR    . PROSTATECTOMY  1994    Allergies: No Known Allergies  Home Medications: (Not in a hospital admission)  Home medication reconciliation was completed with the patient.   Scheduled Inpatient Medications:   . [START ON 04/30/2019] feeding supplement (ENSURE ENLIVE)  237 mL Oral BID BM  . insulin aspart  0-5 Units Subcutaneous QHS  . insulin aspart  0-9 Units Subcutaneous TID WC    Continuous Inpatient Infusions:    PRN Inpatient Medications:  acetaminophen **OR** acetaminophen, polyethylene glycol  Family History:  family history includes Aneurysm in his father; Cancer in his mother.   GI Family History: Negative  Social History:   reports that he quit smoking about 45 years ago. His smoking use included cigarettes. He has a 37.50 pack-year smoking history. He has never used smokeless tobacco. He reports that he does not drink alcohol or use drugs. The patient denies ETOH, tobacco, or drug use.    Review of Systems: ROS Review of Systems - History obtained from spouse General ROS: positive for  - fatigue and weight loss negative for - fever, hot flashes or night sweats Psychological ROS: negative Ophthalmic ROS:  negative ENT ROS: positive for - headache negative for - epistaxis Allergy and Immunology ROS: negative Hematological and Lymphatic ROS: positive for - bleeding problems, bruising, fatigue and weight loss negative for - blood clots, jaundice or swollen lymph nodes Endocrine ROS: positive for - malaise/lethargy and skin changes negative for - temperature intolerance Respiratory ROS: positive for - shortness of breath negative for - cough, hemoptysis or pleuritic pain Cardiovascular ROS: no chest pain or dyspnea on exertion Genito-Urinary ROS: no dysuria, trouble voiding, or hematuria Musculoskeletal ROS: positive for - pain in back - lower Neurological ROS: no TIA or stroke symptoms Dermatological ROS: 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, bruising on left ear negative for rash  Physical Examination: BP (!) 141/52   Pulse 79   Temp 98.9 F (37.2 C) (Oral)   Resp 20   Ht 6' (1.829 m)   Wt 70 kg   SpO2 100%   BMI 20.93 kg/m  Physical Exam  Data: Lab Results  Component Value Date   WBC 7.2 04/29/2019   HGB 6.0 (L) 04/29/2019   HCT 18.5 (L) 04/29/2019   MCV 101.1 (H) 04/29/2019   PLT 204 04/29/2019   Recent Labs  Lab 04/29/19 1422  HGB 6.0*   Lab Results  Component Value Date   NA 143 04/29/2019   K 3.4 (L) 04/29/2019   CL 108 04/29/2019   CO2 23 04/29/2019   BUN 88 (H) 04/29/2019   CREATININE 1.72 (H) 04/29/2019   Lab Results  Component Value Date   ALT 13 04/29/2019   AST 31 04/29/2019   ALKPHOS 70 04/29/2019   BILITOT 0.5 04/29/2019   No results for input(s): APTT, INR, PTT in the last 168 hours. CBC Latest Ref Rng & Units 04/29/2019 04/01/2019 03/04/2019  WBC 4.0 - 10.5 K/uL 7.2 5.8 6.2  Hemoglobin 13.0 - 17.0 g/dL 6.0(L) 10.1(L) 10.8(L)  Hematocrit 39.0 - 52.0 % 18.5(L) 30.6(L) 32.8(L)  Platelets 150 - 400 K/uL 204 170 163    STUDIES: Ct Head Wo Contrast  Result Date: 04/29/2019 CLINICAL DATA:  Head trauma with headache. EXAM: CT HEAD WITHOUT CONTRAST  TECHNIQUE: Contiguous axial images were obtained from the base of the skull through the vertex without intravenous contrast. COMPARISON:  None. FINDINGS: Brain: No evidence of acute infarction, hemorrhage, hydrocephalus, extra-axial collection or mass lesion/mass effect. Prominent brain parenchymal volume loss. Moderate deep white matter microangiopathy. Vascular: Calcific atherosclerotic disease. Skull: Normal. Negative for fracture or focal lesion. Sinuses/Orbits: No acute finding. Other: None. IMPRESSION: 1. No acute intracranial abnormality. 2. Atrophy, chronic microvascular disease. Electronically Signed   By: Fidela Salisbury M.D.   On: 04/29/2019 15:48   @IMAGES @  Assessment: 1.  Metastatic prostate cancer- being seen by palliative care and oncology. 2.  Anemia secondary to gastrointestinal blood loss-Hemoccult positive with anemia and markedly decreased hemoglobin over the past 2 months 3.  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  Partial CODE STATUS with DO NOT INTUBATE  COVID-19 status:     Tested negative     Recommendations:  1.  Would embark upon EGD and colonoscopy tomorrow, however, patient is eaten solid food this evening.  Will need to transition to clear liquid diet with plans to do a bowel preparation tomorrow in preparation for EGD and colonoscopy at the soonest possible time.  Patient appears to be reluctant to proceed with endoluminal evaluation, however he does not appear wholly competent or alert to the details presented to him.  His wife, [name], reports she is next of kin and is able to give consent and she wishes to have both procedures performed.  We will plan accordingly.The patient and his wife understands the nature of the planned procedure, indications, risks, alternatives and potential complications including but not limited to bleeding, infection, perforation, damage to internal  organs and possible oversedation/side effects from anesthesia. The patient's wife agrees and gives consent to proceed.  Please refer to procedure notes for findings, recommendations and patient disposition/instructions.    Thank you for the consult. Please call with questions or concerns.  Olean Ree, "Lanny Hurst MD [facility] [employer] [employer], [place] [id number] [phone]  04/29/2019 7:30 PM

## 2019-04-29 NOTE — ED Notes (Signed)
Pt given food tray. Wife assisting pt with it.

## 2019-04-29 NOTE — ED Notes (Signed)
Blood was actually started at [facility]

## 2019-04-29 NOTE — ED Notes (Signed)
Pt given two more warm blankets 

## 2019-04-29 NOTE — ED Provider Notes (Signed)
[facility] Emergency Department Provider Note  ____________________________________________   First MD Initiated Contact with Patient 04/29/19 1418     (approximate)  I have reviewed the triage vital signs and the nursing notes.  History  Chief Complaint Weakness    HPI [name] is a 78 y.o. male with history of metastatic prostate cancer, on oral chemotherapy, hypertension, hypercholesterolemia, diabetes who presents to the emergency department for increasing generalized weakness, with associated multiple falls.  Wife at bedside states he has fallen multiple times over the last several days.  She does think he hit his head.  On medication review, he does not appear to be on any anticoagulation.  The patient denies any vomiting or diarrhea.  He denies any bloody or melanotic stools.  No blood in the urine.         Past Medical Hx Past Medical History:  Diagnosis Date  . Aortic stenosis, moderate 01/30/2014  . Appetite loss   . Arthritis   . Atrial flutter ([place]) 02/02/2014  . Back pain   . Diabetes mellitus without complication (HCC)    diet control  . GERD (gastroesophageal reflux disease)   . Gout   . Heart murmur   . Heart murmur   . History of kidney stones   . HTN (hypertension) 01/30/2014  . Hypercholesteremia   . Hypertension   . Prostate cancer ([place])    metastatic  . Weakness of left leg     Problem List Patient Active Problem List   Diagnosis Date Noted  . Palliative care encounter 12/30/2018  . Trochanteric bursitis of left hip 07/08/2018  . Goals of care, counseling/discussion 07/05/2017  . Prostate cancer metastatic to bone ([place]) 04/11/2015  . Atrial flutter ([place]) 02/02/2014  . Dysphagia 01/30/2014  . HTN (hypertension) 01/30/2014  . Other and unspecified hyperlipidemia 01/30/2014  . Overweight 01/30/2014  . Aortic stenosis, moderate 01/30/2014  . Chronic diastolic heart failure ([place]) 01/30/2014  . NSTEMI (non-ST  elevated myocardial infarction) ([place]) 01/30/2014  . Toe ulcer ([place]) 01/30/2014  . Severe sepsis ([place]) 01/14/2014  . Acute renal failure ([place]) 01/14/2014  . Metabolic acidosis [id number]  . Hyperkalemia 01/14/2014  . Thrombocytopenia ([place]) 01/14/2014  . Anemia 01/14/2014  . Aspiration pneumonia ([place]) 01/14/2014  . Diabetes mellitus ([place]) 01/14/2014    Past Surgical Hx Past Surgical History:  Procedure Laterality Date  . 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 CATARACT EXTRACTION W/ INTRAOCULAR LENS IMPLANT Bilateral   . COLON RESECTION    . CYSTOSCOPY W/ RETROGRADES Left 06/08/2015   Procedure: CYSTOSCOPY WITH RETROGRADE PYELOGRAM;  Surgeon: Nickie Retort, MD;  Location: [facility] ORS;  Service: Urology;  Laterality: Left;  . CYSTOSCOPY W/ RETROGRADES Left 05/13/2018   Procedure: CYSTOSCOPY WITH RETROGRADE PYELOGRAM;  Surgeon: Abbie Sons, MD;  Location: [facility] ORS;  Service: Urology;  Laterality: Left;  . CYSTOSCOPY W/ RETROGRADES Left 02/03/2019   Procedure: CYSTOSCOPY WITH RETROGRADE PYELOGRAM;  Surgeon: Abbie Sons, MD;  Location: [facility] ORS;  Service: Urology;  Laterality: Left;  . CYSTOSCOPY W/ URETERAL STENT PLACEMENT Left 10/05/2015   Procedure: CYSTOSCOPY WITH STENT REPLACEMENT;  Surgeon: Nickie Retort, MD;  Location: [facility] ORS;  Service: Urology;  Laterality: Left;  . CYSTOSCOPY W/ URETERAL STENT PLACEMENT Left 01/04/2016   Procedure: CYSTOSCOPY WITH STENT REPLACEMENT;  Surgeon: Nickie Retort, MD;  Location: [facility];  Service: Urology;  Laterality: Left;  . CYSTOSCOPY W/ URETERAL STENT PLACEMENT Left 04/11/2016   Procedure: CYSTOSCOPY WITH STENT REPLACEMENT;  Surgeon: Nickie Retort, MD;  Location: [facility] OR[facility];  Service: Urology;  Laterality: Left;  . CYSTOSCOPY W/ URETERAL STENT PLACEMENT Left 07/18/2016   Procedure: CYSTOSCOPY WITH STENT  REPLACEMENT;  Surgeon: Nickie Retort, MD;  Location: [facility];  Service: Urology;  Laterality: Left;  . CYSTOSCOPY W/ URETERAL STENT PLACEMENT Left 11/02/2016   Procedure: CYSTOSCOPY WITH STENT REPLACEMENT;  Surgeon: Nickie Retort, MD;  Location: [facility];  Service: Urology;  Laterality: Left;  . CYSTOSCOPY W/ URETERAL STENT PLACEMENT Left 02/08/2017   Procedure: CYSTOSCOPY WITH STENT REPLACEMENT;  Surgeon: Nickie Retort, MD;  Location: [facility];  Service: Urology;  Laterality: Left;  . CYSTOSCOPY W/ URETERAL STENT PLACEMENT Left 05/10/2017   Procedure: CYSTOSCOPY WITH STENT REPLACEMENT;  Surgeon: Nickie Retort, MD;  Location: [facility];  Service: Urology;  Laterality: Left;  . CYSTOSCOPY W/ URETERAL STENT PLACEMENT Left 05/13/2018   Procedure: CYSTOSCOPY WITH STENT Exchange;  Surgeon: Abbie Sons, MD;  Location: [facility];  Service: Urology;  Laterality: Left;  . CYSTOSCOPY W/ URETERAL STENT PLACEMENT Left 02/03/2019   Procedure: CYSTOSCOPY WITH STENT REPLACEMENT;  Surgeon: Abbie Sons, MD;  Location: [facility];  Service: Urology;  Laterality: Left;  . CYSTOSCOPY WITH STENT PLACEMENT Left 08/16/2017   Procedure: CYSTOSCOPY WITH STENT EXCHANGE;  Surgeon: Abbie Sons, MD;  Location: [facility];  Service: Urology;  Laterality: Left;  . CYSTOSCOPY WITH STENT PLACEMENT Left 12/17/2017   Procedure: CYSTOSCOPY WITH STENT EXCHANGE;  Surgeon: Abbie Sons, MD;  Location: [facility];  Service: Urology;  Laterality: Left;  . EYE SURGERY Bilateral Sept and Oct.2012   cataract  . HERNIA REPAIR    . PROSTATECTOMY  1994    Medications Prior to Admission medications   Medication Sig Start Date End Date Taking? Authorizing Provider  allopurinol (ZYLOPRIM) 300 MG tablet Take 300 mg by mouth daily.  01/07/14   [provider]  aspirin 81 MG tablet Take 81 mg by mouth daily.     [provider]  atorvastatin (LIPITOR) 10 MG tablet Take 10 mg by mouth daily with  supper.     [provider]  carvedilol (COREG) 6.25 MG tablet Take 1 tablet (6.25 mg total) by mouth 2 (two) times daily with a meal. 02/11/14   Reyne Dumas, MD  chlorthalidone (HYGROTON) 25 MG tablet Take 25 mg by mouth daily.  07/18/17   [provider]  famotidine (PEPCID) 40 MG tablet  03/23/19   [provider]  fentaNYL (DURAGESIC) 50 MCG/HR Place 1 patch onto the skin every 3 (three) days. 04/01/19   Cammie Sickle, MD  ferrous sulfate 325 (65 FE) MG tablet Take 325 mg by mouth daily with breakfast.     [provider]  hydrochlorothiazide (MICROZIDE) 12.5 MG capsule Take 12.5 mg by mouth daily.    [provider]  metFORMIN (GLUCOPHAGE) 500 MG tablet Take 500 mg by mouth daily.  09/30/18   [provider]  mirabegron ER (MYRBETRIQ) 25 MG TB24 tablet Take 1 tablet (25 mg total) by mouth daily. 10/28/18   Stoioff, Ronda Fairly, MD  naloxone [facility]) nasal spray 4 mg/0.1 mL For use with opioid overdose. 1 spray delivered by intranasal administration. Call 911 (EMS) if used. Patient not taking: Reported on 12/25/2018 09/25/18   Borders, Kirt Boys, NP  ondansetron (ZOFRAN) 8 MG tablet  Take 1 tablet (8 mg total) by mouth every 8 (eight) hours as needed for nausea or vomiting. 02/18/19   Borders, Kirt Boys, NP  oxybutynin (DITROPAN-XL) 5 MG 24 hr tablet Take 1 tablet (5 mg total) by mouth 3 (three) times daily. Patient taking differently: Take 5 mg by mouth at bedtime.  09/08/18   Stoioff, Ronda Fairly, MD  Oxycodone HCl 10 MG TABS Take 1 tablet (10 mg total) by mouth every 6 (six) hours as needed. for pain 04/09/19   Borders, Kirt Boys, NP  pantoprazole (PROTONIX) 40 MG tablet Take 1 tablet (40 mg total) by mouth daily before breakfast. 02/11/14   Reyne Dumas, MD  triamcinolone (KENALOG) 0.025 % ointment Apply 1 application topically 2 (two) times daily. Apply to clean, dry feet as directed 12/25/18   Verlon Au, NP  vitamin B-12 (CYANOCOBALAMIN) 1000 MCG  tablet Take 1,000 mcg by mouth daily.    [provider]  vitamin C (ASCORBIC ACID) 500 MG tablet Take 500 mg by mouth daily.    [provider]  Vitamin D, Ergocalciferol, (DRISDOL) 1.25 MG (50000 UT) CAPS capsule Take 5,000 Units by mouth once a week.     [provider]  XTANDI 40 MG capsule TAKE 4 CAPSULES (160 MG TOTAL) BY MOUTH DAILY. 11/18/18   Cammie Sickle, MD    Allergies Patient has no known allergies.  Family Hx Family History  Problem Relation Age of Onset  . Aneurysm Father   . Cancer Mother        breast    Social Hx Social History   Tobacco Use  . Smoking status: Former Smoker    Packs/day: 1.50    Years: 25.00    Pack years: 37.50    Types: Cigarettes    Quit date: 01/21/1974    Years since quitting: 45.2  . Smokeless tobacco: Never Used  Substance Use Topics  . Alcohol use: No  . Drug use: No     Review of Systems  Constitutional: Negative for fever. Negative for chills. + Generalized weakness, frequent falls Eyes: Negative for visual changes. ENT: Negative for sore throat. Cardiovascular: Negative for chest pain. Respiratory: Negative for shortness of breath. Gastrointestinal: Negative for abdominal pain. Negative for nausea. Negative for vomiting. Genitourinary: Negative for dysuria. Musculoskeletal: Negative for leg swelling. Skin: + Ecchymosis of various stages Neurological: Negative for for headaches.   Physical Exam  Vital Signs: ED Triage Vitals  Enc Vitals Group     BP 04/29/19 1415 118/73     Pulse Rate 04/29/19 1415 78     Resp 04/29/19 1415 17     Temp 04/29/19 1415 98.6 F (37 C)     Temp Source 04/29/19 1415 Oral     SpO2 04/29/19 1415 100 %     Weight 04/29/19 1416 154 lb 5.2 oz (70 kg)     Height 04/29/19 1416 6' (1.829 m)     Head Circumference --      Peak Flow --      Pain Score 04/29/19 1416 0     Pain Loc --      Pain Edu? --      Excl. in [place]? --     Constitutional: Alert and  oriented.  Hard of hearing. Eyes: Conjunctivae clear. Sclera anicteric. Head: Normocephalic. Atraumatic. Nose: No congestion. No rhinorrhea. Mouth/Throat: Mucous membranes are dry.  Neck: No stridor.   Cardiovascular: Normal rate, regular rhythm. Extremities well perfused. Respiratory: Normal respiratory effort.  Lungs CTAB. Gastrointestinal: Soft and non-tender.  Rectal: RN chaperone present.  Dark stool, immediately guaiac positive. Musculoskeletal: No lower extremity edema. Neurologic:  Normal speech and language. No gross focal neurologic deficits are appreciated.  Skin: Ecchymosis of various stages scattered among upper and lower extremity. Psychiatric: Mood and affect are appropriate for situation.   Radiology  CT head: IMPRESSION: 1. No acute intracranial abnormality. 2. Atrophy, chronic microvascular disease   Procedures  Procedure(s) performed (including critical care):  .Critical Care Performed by: Lilia Pro., MD Authorized by: Lilia Pro., MD   Critical care provider statement:    Critical care time (minutes):  30   Critical care was necessary to treat or prevent imminent or life-threatening deterioration of the following conditions: GIB, anemia requiring transfusion.   Critical care was time spent personally by me on the following activities:  Evaluation of patient's response to treatment, examination of patient, ordering and performing treatments and interventions, ordering and review of laboratory studies, ordering and review of radiographic studies, pulse oximetry, re-evaluation of patient's condition, obtaining history from patient or surrogate and review of old charts     Initial Impression / Assessment and Plan / ED Course  78 y.o. male with a history of metastatic prostate cancer who presents to the ED for increased generalized weakness, frequent falls.  On exam, he is noted to be guaiac positive.  He has scattered ecchymosis of various stages  to both upper and lower extremities, but no obvious deformities.  Work-up reveals new anemia, hemoglobin is 6, from 10.1 previously.  Likely GI source, given positive guaiac.  Patient consented for transfusion, will transfuse 2 units.  Given IV PPI.   Discussed with hospitalist for admission.    Final Clinical Impression(s) / ED Diagnosis  Final diagnoses:  Weakness  Frequent falls  Anemia, unspecified type       Note:  This document was prepared using Dragon voice recognition software and may include unintentional dictation errors.   Lilia Pro., MD 04/29/19 [phone]

## 2019-04-29 NOTE — Telephone Encounter (Signed)
Wife called requesting to speak with Nira Conn. I returned her call and she reports that patient is very weak and will be unable to get to his appointment Friday unless someone can pick him up for it Lucianne Lei). They do have a ramp on the back of the house, but he does not have a wheelchair. She reports that he is so weak that he has fallen several times and she has had to call non emergent EMS to come and get him up out of the floor. She reports that he is not doing well at all. Please advise and/or return call [phone]

## 2019-04-29 NOTE — ED Notes (Signed)
360cc of blood given; first unit finished; pt tolerated well.

## 2019-04-29 NOTE — ED Notes (Signed)
Per [place] blood to 999cc/hr as pt not showing any signs of allergic reaction and remains hypotensive.

## 2019-04-30 DIAGNOSIS — D649 Anemia, unspecified: Secondary | ICD-10-CM

## 2019-04-30 DIAGNOSIS — R531 Weakness: Secondary | ICD-10-CM

## 2019-04-30 DIAGNOSIS — Z515 Encounter for palliative care: Secondary | ICD-10-CM

## 2019-04-30 LAB — GLUCOSE, CAPILLARY
Glucose-Capillary: 143 mg/dL — ABNORMAL HIGH (ref 70–99)
Glucose-Capillary: 144 mg/dL — ABNORMAL HIGH (ref 70–99)
Glucose-Capillary: 150 mg/dL — ABNORMAL HIGH (ref 70–99)
Glucose-Capillary: 121 mg/dL — ABNORMAL HIGH (ref 70–99)

## 2019-04-30 LAB — BASIC METABOLIC PANEL
Anion gap: 12 (ref 5–15)
BUN: 91 mg/dL — ABNORMAL HIGH (ref 8–23)
CO2: 22 mmol/L (ref 22–32)
Calcium: 9.7 mg/dL (ref 8.9–10.3)
Chloride: 112 mmol/L — ABNORMAL HIGH (ref 98–111)
Creatinine, Ser: 1.47 mg/dL — ABNORMAL HIGH (ref 0.61–1.24)
GFR calc Af Amer: 53 mL/min — ABNORMAL LOW (ref >60)
GFR calc non Af Amer: 45 mL/min — ABNORMAL LOW (ref >60)
Glucose, Bld: 122 mg/dL — ABNORMAL HIGH (ref 70–99)
Potassium: 3.5 mmol/L (ref 3.5–5.1)
Sodium: 146 mmol/L — ABNORMAL HIGH (ref 135–145)

## 2019-04-30 LAB — HEMOGLOBIN: Hemoglobin: 7.8 g/dL — ABNORMAL LOW (ref 13.0–17.0)

## 2019-04-30 LAB — CBC
HCT: 19.4 % — ABNORMAL LOW (ref 39.0–52.0)
Hemoglobin: 6.4 g/dL — ABNORMAL LOW (ref 13.0–17.0)
MCH: 32.7 pg (ref 26.0–34.0)
MCHC: 33 g/dL (ref 30.0–36.0)
MCV: 99 fL (ref 80.0–100.0)
Platelets: 186 10*3/uL (ref 150–400)
RBC: 1.96 MIL/uL — ABNORMAL LOW (ref 4.22–5.81)
RDW: 17.9 % — ABNORMAL HIGH (ref 11.5–15.5)
WBC: 7.5 10*3/uL (ref 4.0–10.5)
nRBC: 0.5 % — ABNORMAL HIGH (ref 0.0–0.2)

## 2019-04-30 LAB — VITAMIN B12: Vitamin B-12: 547 pg/mL (ref 180–914)

## 2019-04-30 MED ORDER — INFLUENZA VAC A&B SA ADJ QUAD 0.5 ML IM PRSY
0.5000 mL | PREFILLED_SYRINGE | INTRAMUSCULAR | Status: AC
  Administered 2019-05-01: 11:00:00 0.5 mL via INTRAMUSCULAR
  Filled 2019-04-30 (×2): qty 0.5

## 2019-04-30 MED ORDER — SODIUM CHLORIDE 0.9% IV SOLUTION
Freq: Once | INTRAVENOUS | Status: AC
  Administered 2019-04-30: 15:00:00 via INTRAVENOUS

## 2019-04-30 MED ORDER — VITAMIN C 500 MG PO TABS
500.0000 mg | ORAL_TABLET | Freq: Two times a day (BID) | ORAL | Status: DC
  Administered 2019-04-30 – 2019-05-07 (×11): 500 mg via ORAL
  Filled 2019-04-30 (×11): qty 1

## 2019-04-30 MED ORDER — ZIPRASIDONE MESYLATE 20 MG IM SOLR
10.0000 mg | Freq: Four times a day (QID) | INTRAMUSCULAR | Status: DC | PRN
  Administered 2019-04-30: 10 mg via INTRAMUSCULAR
  Filled 2019-04-30 (×2): qty 20

## 2019-04-30 MED ORDER — FENTANYL 50 MCG/HR TD PT72
1.0000 | MEDICATED_PATCH | TRANSDERMAL | Status: DC
  Administered 2019-04-30: 13:00:00 1 via TRANSDERMAL
  Filled 2019-04-30: qty 1

## 2019-04-30 MED ORDER — POLYETHYLENE GLYCOL 3350 17 GM/SCOOP PO POWD
1.0000 | Freq: Once | ORAL | Status: AC
  Administered 2019-04-30: 18:00:00 255 g via ORAL
  Filled 2019-04-30 (×2): qty 255

## 2019-04-30 MED ORDER — BOOST / RESOURCE BREEZE PO LIQD CUSTOM
1.0000 | Freq: Three times a day (TID) | ORAL | Status: DC
  Administered 2019-04-30 (×2): 1 via ORAL

## 2019-04-30 MED ORDER — SODIUM CHLORIDE 0.9 % IV SOLN
INTRAVENOUS | Status: DC
  Administered 2019-04-30: 13:00:00 via INTRAVENOUS

## 2019-04-30 NOTE — Progress Notes (Addendum)
Initial Nutrition Assessment  DOCUMENTATION CODES:   Severe malnutrition in context of chronic illness  INTERVENTION:   Boost Breeze po TID, each supplement provides 250 kcal and 9 grams of protein  Vitamin C 547m po BID  NUTRITION DIAGNOSIS:   Severe Malnutrition related to cancer and cancer related treatments as evidenced by severe fat depletion, severe muscle depletion, 23 percent weight loss over 6 months.  GOAL:   Patient will meet greater than or equal to 90% of their needs  MONITOR:   PO intake, Supplement acceptance, Labs, Weight trends, Skin, I & O's  REASON FOR ASSESSMENT:   Consult Assessment of nutrition requirement/status  ASSESSMENT:   78y.o. male with a known history of prostate cancer, hypertension, diabetes mellitus, atrial flutter, appetite loss presents to the hospital due to weakness and recurrent falls with bruising all over.   Met with pt and pt's wife in room today. Pt sleeping at time of RD visit so majority of history was obtained from pt's wife. Wife reports pt with poor appetite and oral intake at baseline. Wife reports that patient does eat better with assistance and a lot of encouragement. Pt does drink some chocolate Ensure at home and takes a daily B12 and vitamin C. Pt also enjoys chocolate ice cream. Pt is currently on a clear liquid diet for EGD/colonoscopy tomorrow. Pt is edentulous; pt does have dentures but does not like to wear them. Pt is requesting a mechanical soft diet once advanced. RD will add supplements and vitamins to help pt meet his estimated needs. Pt drinks better with a straw per wife report. Pt would like chocolate Ensure and chocolate Magic Cups once diet advanced. Per chart, pt has lost 40lbs(23%) over the past 6 months and 17lbs(11%) over the past month; this is severe weight loss.    Medications reviewed and include: allopurinol, dulcolax, insulin, protonix, NaCl @50ml /hr  Labs reviewed: Na 146(H), BUN 91(H), creat  1.47(H) Hgb 6.4(L), Hct 19.4(L)  NUTRITION - FOCUSED PHYSICAL EXAM:    Most Recent Value  Orbital Region  Moderate depletion  Upper Arm Region  Mild depletion  Thoracic and Lumbar Region  Severe depletion  Buccal Region  Severe depletion  Temple Region  Severe depletion  Clavicle Bone Region  Moderate depletion  Clavicle and Acromion Bone Region  Moderate depletion  Scapular Bone Region  Severe depletion  Dorsal Hand  Severe depletion  Patellar Region  Severe depletion  Anterior Thigh Region  Moderate depletion  Posterior Calf Region  Severe depletion  Edema (RD Assessment)  None  Hair  Reviewed  Eyes  Reviewed  Mouth  Reviewed  Skin  Reviewed [widespread ecchymosis]  Nails  Reviewed     Diet Order:   Diet Order            Diet NPO time specified  Diet effective 0500        Diet clear liquid Room service appropriate? Yes; Fluid consistency: Thin  Diet effective now             EDUCATION NEEDS:   Education needs have been addressed  Skin:  Skin Assessment: Reviewed RN Assessment(widespread ecchymosis, skin tears, wound L great toe)  Last BM:  pta  Height:   Ht Readings from Last 1 Encounters:  04/29/19 6' (1.829 m)    Weight:   Wt Readings from Last 1 Encounters:  04/29/19 62 kg    Ideal Body Weight:  80.9 kg  BMI:  Body mass index is 18.54 kg/m.  Estimated Nutritional Needs:   Kcal:  1800-2100kcal/day  Protein:  90-105g/day  Fluid:  >1.6L/day  Koleen Distance MS, RD, LDN Pager #- [phone] Office#- [phone] After Hours Pager: [phone]

## 2019-04-30 NOTE — Progress Notes (Signed)
Patient unable to tolerate bowel prep and is taking in a very small amount with no bowel movement for the past two days. On call GI was notified and recommended to continue NPO status after midnight and likely complete only upper GI in the morning. Will continue to encourage the patient to drink prep and make NPO after midnight

## 2019-04-30 NOTE — Progress Notes (Signed)
[place] [facility] [employer] NAME: [name]    MR#:  [id number]  DATE OF BIRTH:  16-Jan-1941  SUBJECTIVE:  CHIEF COMPLAINT:   Chief Complaint  Patient presents with  . Weakness   - patient appears weak, sleeping but arousable.  Moaning in pain this morning.  Hemoglobin still at 6.4.  Received 1 unit packed RBC transfusion yesterday  REVIEW OF SYSTEMS:  Review of Systems  Constitutional: Negative for chills and fever.  Respiratory: Negative for cough, shortness of breath and wheezing.   Cardiovascular: Negative for chest pain and palpitations.  Gastrointestinal: Negative for abdominal pain, constipation, diarrhea, nausea and vomiting.  Genitourinary: Negative for dysuria.  Musculoskeletal: Positive for back pain, joint pain and myalgias.  Neurological: Negative for dizziness, seizures and headaches.    DRUG ALLERGIES:  No Known Allergies  VITALS:  Blood pressure (!) 94/57, pulse 97, temperature 99.3 F (37.4 C), temperature source Oral, resp. rate 20, height 6' (1.829 m), weight 62 kg, SpO2 95 %.  PHYSICAL EXAMINATION:  Physical Exam   GENERAL:  78 y.o.-year-old thin built well-nourished patient lying in the bed and appears to be in distress secondary to pain.  EYES: Pupils equal, round, reactive to light and accommodation. No scleral icterus. Extraocular muscles intact.  HEENT: Head atraumatic, normocephalic. Oropharynx and nasopharynx clear.  Dry mucous membranes NECK:  Supple, no jugular venous distention. No thyroid enlargement, no tenderness.  LUNGS: Normal breath sounds bilaterally, no wheezing, rales,rhonchi or crepitation. No use of accessory muscles of respiration.  Decreased bibasilar breath sounds CARDIOVASCULAR: S1, S2 normal. No  rubs, or gallops.  3/6 systolic murmur is present ABDOMEN: Soft, nontender, nondistended. Bowel sounds present. No organomegaly or mass.  EXTREMITIES: No pedal edema, cyanosis, or clubbing.   NEUROLOGIC: Groggy this morning, speech is not understandable as patient does not have dentures.  Not following simple commands.  Sensation is intact and withdrawing to pain and touch.  PSYCHIATRIC: The patient is alert but appears disoriented. SKIN: No obvious rash, lesion, or ulcer.    LABORATORY PANEL:   CBC Recent Labs  Lab 04/30/19 0516  WBC 7.5  HGB 6.4*  HCT 19.4*  PLT 186   ------------------------------------------------------------------------------------------------------------------  Chemistries  Recent Labs  Lab 04/29/19 1422 04/30/19 0516  NA 143 146*  K 3.4* 3.5  CL 108 112*  CO2 23 22  GLUCOSE 146* 122*  BUN 88* 91*  CREATININE 1.72* 1.47*  CALCIUM 10.0 9.7  MG 1.9  --   AST 31  --   ALT 13  --   ALKPHOS 70  --   BILITOT 0.5  --    ------------------------------------------------------------------------------------------------------------------  Cardiac Enzymes No results for input(s): TROPONINI in the last 168 hours. ------------------------------------------------------------------------------------------------------------------  RADIOLOGY:  Ct Head Wo Contrast  Result Date: 04/29/2019 CLINICAL DATA:  Head trauma with headache. EXAM: CT HEAD WITHOUT CONTRAST TECHNIQUE: Contiguous axial images were obtained from the base of the skull through the vertex without intravenous contrast. COMPARISON:  None. FINDINGS: Brain: No evidence of acute infarction, hemorrhage, hydrocephalus, extra-axial collection or mass lesion/mass effect. Prominent brain parenchymal volume loss. Moderate deep white matter microangiopathy. Vascular: Calcific atherosclerotic disease. Skull: Normal. Negative for fracture or focal lesion. Sinuses/Orbits: No acute finding. Other: None. IMPRESSION: 1. No acute intracranial abnormality. 2. Atrophy, chronic microvascular disease. Electronically Signed   By: Fidela Salisbury M.D.   On: 04/29/2019 15:48    EKG:   Orders placed or  performed during the hospital encounter of 04/29/19  .  ED EKG  . 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  1.  Acute on chronic anemia-likely anemia of chronic disease, iron levels within normal limits.  B12 greater than 500 -Hemoglobin 6.0 on admission, baseline is at 10. -Received 1 unit packed RBC transfusion yesterday, second unit ordered for now -GI has been consulted.  For EGD and colonoscopy tomorrow -On clear liquid diet. -On oral Protonix.  2.  Metastatic prostate cancer-patient has stage IV prostate cancer currently on Xtandi. -Overall clinically doing very poorly.  Weight loss, generalized weakness and now with acute anemia. -Has seen palliative care as outpatient.  Reconsult palliative care in the hospital.  3.  Chronic pain from metastatic prostate cancer to balance -Restarted his fentanyl patch this morning. Added oxycodone as needed for pain  4.  Hypertension-on low-dose Coreg  5.  DVT prophylaxis-teds and SCDs only    All the records are reviewed and case discussed with Care Management/Social Workerr. Management plans discussed with the patient, family and they are in agreement.  CODE STATUS: Partial code  TOTAL TIME TAKING CARE OF THIS PATIENT: 39 minutes.   POSSIBLE D/C IN 3-4 DAYS, DEPENDING ON CLINICAL CONDITION.   [name] M.D on 04/30/2019 at 11:25 AM  Between 7am to 6pm - Pager - [phone]  After 6pm go to [email or web address] - password [email or web address] [facility]  Office  [phone]  CC: Primary care physician; Tamsen Roers, MD

## 2019-04-30 NOTE — Consult Note (Addendum)
[place]  [phone] [phone]   Name: [name] Date: 04/30/2019 MRN: [id number]  DOB: [birth year]  Patient Care Team: Tamsen Roers, MD as PCP - General (Family Medicine) Bernardo Heater, Ronda Fairly, MD as Consulting Physician (Urology)    REASON FOR CONSULTATION: Palliative Care consult requested for this 78 y.o. 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  PMH also notable for hydronephrosis status post stenting, moderate aortic stenosis, hypertension, diabetes. Patient was admitted to the hospital on 04/29/19 with symptomatic anemia from GI bleed. He has been evaluated by GI with plan for EGD and colonoscopy. He was referred to palliative care for help address goals.   SOCIAL HISTORY:     reports that he quit smoking about 45 years ago. His smoking use included cigarettes. He has a 37.50 pack-year smoking history. He has never used smokeless tobacco. He reports that he does not drink alcohol or use drugs.   Patient is married and lives at home with his wife.  He has several adult children.  He is a retired [employer] examiner for [employer]:  Not on file  CODE STATUS: Limited code  PAST MEDICAL HISTORY: Past Medical History:  Diagnosis Date  . Aortic stenosis, moderate 01/30/2014  . Appetite loss   . Arthritis   . Atrial flutter ([place]) 02/02/2014  . Back pain   . Diabetes mellitus without complication (HCC)    diet control  . GERD (gastroesophageal reflux disease)   . Gout   . Heart murmur   . Heart murmur   . History of kidney stones   . HTN (hypertension) 01/30/2014  . Hypercholesteremia   . Hypertension   . Prostate cancer ([place])    metastatic  . Weakness of left leg     PAST SURGICAL HISTORY:  Past Surgical History:  Procedure Laterality Date  . 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 CATARACT EXTRACTION W/ INTRAOCULAR LENS IMPLANT Bilateral   . COLON RESECTION    . CYSTOSCOPY W/ RETROGRADES Left 06/08/2015   Procedure: CYSTOSCOPY WITH RETROGRADE PYELOGRAM;  Surgeon: Nickie Retort, MD;  Location: [facility];  Service: Urology;  Laterality: Left;  . CYSTOSCOPY W/ RETROGRADES Left 05/13/2018   Procedure: CYSTOSCOPY WITH RETROGRADE PYELOGRAM;  Surgeon: Abbie Sons, MD;  Location: [facility];  Service: Urology;  Laterality: Left;  . CYSTOSCOPY W/ RETROGRADES Left 02/03/2019   Procedure: CYSTOSCOPY WITH RETROGRADE PYELOGRAM;  Surgeon: Abbie Sons, MD;  Location: [facility];  Service: Urology;  Laterality: Left;  . CYSTOSCOPY W/ URETERAL STENT PLACEMENT Left 10/05/2015   Procedure: CYSTOSCOPY WITH STENT REPLACEMENT;  Surgeon: Nickie Retort, MD;  Location: [facility];  Service: Urology;  Laterality: Left;  . CYSTOSCOPY W/ URETERAL STENT PLACEMENT Left 01/04/2016   Procedure: CYSTOSCOPY WITH STENT REPLACEMENT;  Surgeon: Nickie Retort, MD;  Location: [facility];  Service: Urology;  Laterality: Left;  . CYSTOSCOPY W/ URETERAL STENT PLACEMENT Left 04/11/2016   Procedure: CYSTOSCOPY WITH STENT REPLACEMENT;  Surgeon: Nickie Retort, MD;  Location: [facility];  Service: Urology;  Laterality: Left;  . CYSTOSCOPY W/ URETERAL STENT PLACEMENT Left 07/18/2016   Procedure: CYSTOSCOPY WITH STENT REPLACEMENT;  Surgeon: Nickie Retort, MD;  Location: [facility];  Service: Urology;  Laterality:  Left;  . CYSTOSCOPY W/ URETERAL STENT PLACEMENT Left 11/02/2016   Procedure: CYSTOSCOPY WITH STENT REPLACEMENT;  Surgeon: Nickie Retort, MD;  Location: [facility];  Service: Urology;  Laterality: Left;  . CYSTOSCOPY W/ URETERAL STENT PLACEMENT Left 02/08/2017   Procedure: CYSTOSCOPY WITH STENT REPLACEMENT;  Surgeon: Nickie Retort, MD;  Location: [facility];  Service:  Urology;  Laterality: Left;  . CYSTOSCOPY W/ URETERAL STENT PLACEMENT Left 05/10/2017   Procedure: CYSTOSCOPY WITH STENT REPLACEMENT;  Surgeon: Nickie Retort, MD;  Location: [facility];  Service: Urology;  Laterality: Left;  . CYSTOSCOPY W/ URETERAL STENT PLACEMENT Left 05/13/2018   Procedure: CYSTOSCOPY WITH STENT Exchange;  Surgeon: Abbie Sons, MD;  Location: [facility];  Service: Urology;  Laterality: Left;  . CYSTOSCOPY W/ URETERAL STENT PLACEMENT Left 02/03/2019   Procedure: CYSTOSCOPY WITH STENT REPLACEMENT;  Surgeon: Abbie Sons, MD;  Location: [facility];  Service: Urology;  Laterality: Left;  . CYSTOSCOPY WITH STENT PLACEMENT Left 08/16/2017   Procedure: CYSTOSCOPY WITH STENT EXCHANGE;  Surgeon: Abbie Sons, MD;  Location: [facility];  Service: Urology;  Laterality: Left;  . CYSTOSCOPY WITH STENT PLACEMENT Left 12/17/2017   Procedure: CYSTOSCOPY WITH STENT EXCHANGE;  Surgeon: Abbie Sons, MD;  Location: [facility];  Service: Urology;  Laterality: Left;  . EYE SURGERY Bilateral Sept and Oct.2012   cataract  . HERNIA REPAIR    . PROSTATECTOMY  1994    HEMATOLOGY/ONCOLOGY HISTORY:  Oncology History Overview Note  # AUG 2016- METASTATIC PROSTATE CA/ STAGE IV; Castrate sensitive [PSA- 48] ; mets- lumbar spine [s/p pal RT to Aug 2016; Dr.Crystal] /Pelvic LN; 1994- Prostate CA s/p Surgery [facility]]; Lupron q4 M; MARCH 2017- Bone scan- L4/Right pubic rami uptake; PSA- 0.1/testosterone-castrate [<3]  # Bone lesions on denosumab;DEC 2016- Recm q 87M  # NOV 2018- CASTRATE RESISTANT; declined chemo; 08/06/2017- start Zytiga+prednisone; Stopped April 24th [rising PSA];  # may 2019- Xofigo #6 on 10/16]  # OCT 16th 2019- X-tandi  # Left hydronephrosis s/p stenting [Dr.Budzyn] --------------------------------------------   DIAGNOSIS: [ ]  castrate resistant prostate cancer  STAGE:  IV       ;GOALS: palliative  CURRENT/MOST RECENT THERAPY ; Xtandi   Prostate cancer  metastatic to bone [facility])    ALLERGIES:  has No Known Allergies.  MEDICATIONS:  Current Facility-Administered Medications  Medication Dose Route Frequency Provider Last Rate Last Dose  . 0.9 %  sodium chloride infusion   Intravenous Continuous [name], MD 50 mL/hr at 04/30/19 1312    . acetaminophen (TYLENOL) tablet 650 mg  650 mg Oral Q6H PRN Hillary Bow, MD   650 mg at 04/30/19 0334   Or  . acetaminophen (TYLENOL) suppository 650 mg  650 mg Rectal Q6H PRN Sudini, Alveta Heimlich, MD      . allopurinol (ZYLOPRIM) tablet 300 mg  300 mg Oral Daily Hillary Bow, MD   300 mg at 04/30/19 0903  . atorvastatin (LIPITOR) tablet 10 mg  10 mg Oral Q supper Hillary Bow, MD   10 mg at 04/30/19 1738  . carvedilol (COREG) tablet 6.25 mg  6.25 mg Oral BID WC Hillary Bow, MD   6.25 mg at 04/30/19 1738  . feeding supplement (BOOST / RESOURCE BREEZE) liquid 1 Container  1 Container Oral TID BM [name], MD   1 Container at 04/30/19 1304  . fentaNYL (DURAGESIC) 50 MCG/HR 1 patch  1 patch Transdermal Q72H [name], MD   1 patch at 04/30/19 1313  .  insulin aspart (novoLOG) injection 0-5 Units  0-5 Units Subcutaneous QHS Sudini, Srikar, MD      . insulin aspart (novoLOG) injection 0-9 Units  0-9 Units Subcutaneous TID WC Hillary Bow, MD   1 Units at 04/30/19 1739  . pantoprazole (PROTONIX) EC tablet 40 mg  40 mg Oral QAC breakfast Hillary Bow, MD   40 mg at 04/30/19 0904  . polyethylene glycol (MIRALAX / GLYCOLAX) packet 17 g  17 g Oral Daily PRN Sudini, Alveta Heimlich, MD      . vitamin C (ASCORBIC ACID) tablet 500 mg  500 mg Oral BID [name], MD      . ziprasidone (GEODON) injection 10 mg  10 mg Intramuscular Q6H PRN Mansy, Jan A, MD   10 mg at 04/30/19 0624    VITAL SIGNS: BP 105/72   Pulse 79   Temp 98.6 F (37 C) (Axillary)   Resp 16   Ht 6' (1.829 m)   Wt 136 lb 11.2 oz (62 kg)   SpO2 100%   BMI 18.54 kg/m  Filed Weights   04/29/19 1416 04/29/19 2134   Weight: 154 lb 5.2 oz (70 kg) 136 lb 11.2 oz (62 kg)    Estimated body mass index is 18.54 kg/m as calculated from the following:   Height as of this encounter: 6' (1.829 m).   Weight as of this encounter: 136 lb 11.2 oz (62 kg).  LABS: CBC:    Component Value Date/Time   WBC 7.5 04/30/2019 0516   HGB 6.4 (L) 04/30/2019 0516   HCT 19.4 (L) 04/30/2019 0516   PLT 186 04/30/2019 0516   MCV 99.0 04/30/2019 0516   NEUTROABS 5.6 04/29/2019 1422   LYMPHSABS 0.8 04/29/2019 1422   MONOABS 0.6 04/29/2019 1422   EOSABS 0.1 04/29/2019 1422   BASOSABS 0.1 04/29/2019 1422   Comprehensive Metabolic Panel:    Component Value Date/Time   NA 146 (H) 04/30/2019 0516   K 3.5 04/30/2019 0516   CL 112 (H) 04/30/2019 0516   CO2 22 04/30/2019 0516   BUN 91 (H) 04/30/2019 0516   CREATININE 1.47 (H) 04/30/2019 0516   GLUCOSE 122 (H) 04/30/2019 0516   CALCIUM 9.7 04/30/2019 0516   AST 31 04/29/2019 1422   ALT 13 04/29/2019 1422   ALKPHOS 70 04/29/2019 1422   BILITOT 0.5 04/29/2019 1422   PROT 6.0 (L) 04/29/2019 1422   ALBUMIN 3.1 (L) 04/29/2019 1422    RADIOGRAPHIC STUDIES: Ct Head Wo Contrast  Result Date: 04/29/2019 CLINICAL DATA:  Head trauma with headache. EXAM: CT HEAD WITHOUT CONTRAST TECHNIQUE: Contiguous axial images were obtained from the base of the skull through the vertex without intravenous contrast. COMPARISON:  None. FINDINGS: Brain: No evidence of acute infarction, hemorrhage, hydrocephalus, extra-axial collection or mass lesion/mass effect. Prominent brain parenchymal volume loss. Moderate deep white matter microangiopathy. Vascular: Calcific atherosclerotic disease. Skull: Normal. Negative for fracture or focal lesion. Sinuses/Orbits: No acute finding. Other: None. IMPRESSION: 1. No acute intracranial abnormality. 2. Atrophy, chronic microvascular disease. Electronically Signed   By: Fidela Salisbury M.D.   On: 04/29/2019 15:48    PERFORMANCE STATUS (ECOG) : 3 - Symptomatic,  >50% confined to bed  Review of Systems Unless otherwise noted, a complete review of systems is negative.  Physical Exam General: frail appearing, cachectic Pulmonary: unlabored Extremities: no edema, no joint deformities Skin: pale Neurological: Weakness but otherwise nonfocal  IMPRESSION: Patient is known to me from the clinic. He is currently frail appearing. Hg is 6.4 today  despite receiving blood overnight. He is currently receiving another unit of PRBC.   I attempted to speak with patient regarding his goals but he would not stay awake to engage with me meaningfully in conversation.   I instead met with patient's wife. She describes a pattern of decline over the past several months. Patient has had persistently poor oral intake and weight loss. Per the chart, I note that patient's weight is down to 136lbs from 174lbs three months ago. Two months ago, wife says that he was able to ambulate without assistance. Over the past month, patient has relied on use of a walker and has been mostly sedentary. He has had multiple falls.    Wife asked me about hospice and says that this option has been previously mentioned due to his decline. However, wife says that she is not ready to stop his cancer treatment and she knows that would exclude the option of hospice at the present time.   Despite wife describing patient's decline, she seems optimistic regarding his potential for improvement. She has taken leave from work and plans to stay home with him and help with his care. I tried to explore the possibility that patient could further decline or even approach end of life in the future but wife would only say "we have to take it a day at a time."  We discussed code status. Patient had previously told me that he would not want to be resuscitated nor have his life prolonged on machines. He had a DNR order signed at home. Wife now says that she would want him to have CPR and other resuscitative efforts  but not to be placed on a ventilator. I described the probable futility associated with resuscitation in the setting of metastatic cancer, particularly in the setting of a decision not to intubate. Wife says she will talk to family about this decision.   Case discussed with Dr. Rogue Bussing.   PLAN: -Continue current scope of treatment -Limited code -Will plan to follow in the clinic at time of discharge -Recommend home health    Time Total: 60 minutes  Visit consisted of counseling and education dealing with the complex and emotionally intense issues of symptom management and palliative care in the setting of serious and potentially life-threatening illness.Greater than 50%  of this time was spent counseling and coordinating care related to the above assessment and plan.  Signed by: Altha Harm, PhD, NP-C [phone] (Work Cell)

## 2019-04-30 NOTE — Progress Notes (Signed)
[facility] Gastroenterology Inpatient Progress Note  Subjective: Patient seen for f/u anemia, melena. Patient without bm this am. Still having back pain. Not conversive, appears somewhat confused. Wife is at bedside.   Objective: Vital signs in last 24 hours: Temp:  [97.9 F (36.6 C)-99.3 F (37.4 C)] 98.6 F (37 C) (09/10 1447) Pulse Rate:  [78-99] 82 (09/10 1447) Resp:  [14-20] 15 (09/10 1447) BP: (88-156)/(52-129) 156/129 (09/10 1447) SpO2:  [95 %-100 %] 99 % (09/10 1447) Weight:  [62 kg] 62 kg (09/09 2134) Blood pressure (!) 156/129, pulse 82, temperature 98.6 F (37 C), temperature source Oral, resp. rate 15, height 6' (1.829 m), weight 62 kg, SpO2 99 %.    Intake/Output from previous day: 09/09 0701 - 09/10 0700 In: 800 [P.O.:80; Blood:720] Out: -   Intake/Output this shift: Total I/O In: 840 [P.O.:840] Out: -    General appearance: Illl-appearing, cachectic male. Resp: Coarse bs's Cardio: RRR GI:  Soft, scaphoid, bs+ Extremities: No edema.   Lab Results: Results for orders placed or performed during the hospital encounter of 04/29/19 (from the past 24 hour(s))  Iron and TIBC     Status: Abnormal   Collection Time: 04/29/19  6:56 PM  Result Value Ref Range   Iron 50 45 - 182 ug/dL   TIBC 340 250 - 450 ug/dL   Saturation Ratios 15 (L) 17.9 - 39.5 %   UIBC 290 ug/dL  Ferritin     Status: None   Collection Time: 04/29/19  6:56 PM  Result Value Ref Range   Ferritin 209 24 - 336 ng/mL  Glucose, capillary     Status: Abnormal   Collection Time: 04/29/19  7:03 PM  Result Value Ref Range   Glucose-Capillary 110 (H) 70 - 99 mg/dL  Vitamin B12     Status: None   Collection Time: 04/29/19  7:39 PM  Result Value Ref Range   Vitamin B-12 547 180 - 914 pg/mL  Hemoglobin and hematocrit, blood     Status: Abnormal   Collection Time: 04/29/19  8:06 PM  Result Value Ref Range   Hemoglobin 7.0 (L) 13.0 - 17.0 g/dL   HCT 21.1 (L) 39.0 - 52.0 %  Glucose, capillary      Status: Abnormal   Collection Time: 04/29/19 10:25 PM  Result Value Ref Range   Glucose-Capillary 119 (H) 70 - 99 mg/dL  Basic metabolic panel     Status: Abnormal   Collection Time: 04/30/19  5:16 AM  Result Value Ref Range   Sodium 146 (H) 135 - 145 mmol/L   Potassium 3.5 3.5 - 5.1 mmol/L   Chloride 112 (H) 98 - 111 mmol/L   CO2 22 22 - 32 mmol/L   Glucose, Bld 122 (H) 70 - 99 mg/dL   BUN 91 (H) 8 - 23 mg/dL   Creatinine, Ser 1.47 (H) 0.61 - 1.24 mg/dL   Calcium 9.7 8.9 - 10.3 mg/dL   GFR calc non Af Amer 45 (L) >60 mL/min   GFR calc Af Amer 53 (L) >60 mL/min   Anion gap 12 5 - 15  CBC     Status: Abnormal   Collection Time: 04/30/19  5:16 AM  Result Value Ref Range   WBC 7.5 4.0 - 10.5 K/uL   RBC 1.96 (L) 4.22 - 5.81 MIL/uL   Hemoglobin 6.4 (L) 13.0 - 17.0 g/dL   HCT 19.4 (L) 39.0 - 52.0 %   MCV 99.0 80.0 - 100.0 fL   MCH 32.7 26.0 -  34.0 pg   MCHC 33.0 30.0 - 36.0 g/dL   RDW 17.9 (H) 11.5 - 15.5 %   Platelets 186 150 - 400 K/uL   nRBC 0.5 (H) 0.0 - 0.2 %  Glucose, capillary     Status: Abnormal   Collection Time: 04/30/19  7:45 AM  Result Value Ref Range   Glucose-Capillary 143 (H) 70 - 99 mg/dL  Prepare RBC     Status: None   Collection Time: 04/30/19 11:25 AM  Result Value Ref Range   Order Confirmation      ORDER PROCESSED BY BLOOD BANK Performed at [facility], [address] [place], [place] [address]   Glucose, capillary     Status: Abnormal   Collection Time: 04/30/19 11:54 AM  Result Value Ref Range   Glucose-Capillary 144 (H) 70 - 99 mg/dL  Glucose, capillary     Status: Abnormal   Collection Time: 04/30/19  4:49 PM  Result Value Ref Range   Glucose-Capillary 150 (H) 70 - 99 mg/dL     Recent Labs    04/29/19 1422 04/29/19 2006 04/30/19 0516  WBC 7.2  --  7.5  HGB 6.0* 7.0* 6.4*  HCT 18.5* 21.1* 19.4*  PLT 204  --  186   BMET Recent Labs    04/29/19 1422 04/30/19 0516  NA 143 146*  K 3.4* 3.5  CL 108 112*  CO2 23 22   GLUCOSE 146* 122*  BUN 88* 91*  CREATININE 1.72* 1.47*  CALCIUM 10.0 9.7   LFT Recent Labs    04/29/19 1422  PROT 6.0*  ALBUMIN 3.1*  AST 31  ALT 13  ALKPHOS 70  BILITOT 0.5   PT/INR No results for input(s): LABPROT, INR in the last 72 hours. Hepatitis Panel No results for input(s): HEPBSAG, HCVAB, HEPAIGM, HEPBIGM in the last 72 hours. C-Diff No results for input(s): CDIFFTOX in the last 72 hours. No results for input(s): CDIFFPCR in the last 72 hours.   Studies/Results: Ct Head Wo Contrast  Result Date: 04/29/2019 CLINICAL DATA:  Head trauma with headache. EXAM: CT HEAD WITHOUT CONTRAST TECHNIQUE: Contiguous axial images were obtained from the base of the skull through the vertex without intravenous contrast. COMPARISON:  None. FINDINGS: Brain: No evidence of acute infarction, hemorrhage, hydrocephalus, extra-axial collection or mass lesion/mass effect. Prominent brain parenchymal volume loss. Moderate deep white matter microangiopathy. Vascular: Calcific atherosclerotic disease. Skull: Normal. Negative for fracture or focal lesion. Sinuses/Orbits: No acute finding. Other: None. IMPRESSION: 1. No acute intracranial abnormality. 2. Atrophy, chronic microvascular disease. Electronically Signed   By: Fidela Salisbury M.D.   On: 04/29/2019 15:48    Scheduled Inpatient Medications:   . allopurinol  300 mg Oral Daily  . atorvastatin  10 mg Oral Q supper  . carvedilol  6.25 mg Oral BID WC  . feeding supplement  1 Container Oral TID BM  . fentaNYL  1 patch Transdermal Q72H  . insulin aspart  0-5 Units Subcutaneous QHS  . insulin aspart  0-9 Units Subcutaneous TID WC  . pantoprazole  40 mg Oral QAC breakfast  . polyethylene glycol powder  1 Container Oral Once  . vitamin C  500 mg Oral BID    Continuous Inpatient Infusions:   . sodium chloride 50 mL/hr at 04/30/19 1312    PRN Inpatient Medications:  acetaminophen **OR** acetaminophen, polyethylene glycol,  ziprasidone    Assessment: 1.  Metastatic prostate cancer- being seen by palliative care and oncology. 2.  Anemia secondary to gastrointestinal  blood loss-Hemoccult positive with anemia and markedly decreased hemoglobin over the past 2 months 3.  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 Patient wife, [name], confirmed date or partial colectomy to be November 24, 2007.  4.  Partial CODE STATUS with DO NOT INTUBATE  COVID-19 status:                                             Tested negative  Plan:  1. EGD and colonoscopy in AM. The patient's wife understands the nature of the planned procedure, indications, risks, alternatives and potential complications including but not limited to bleeding, infection, perforation, damage to internal organs and possible oversedation/side effects from anesthesia. The patient's wife agrees and gives consent to proceed.  Please refer to procedure notes for findings, recommendations and patient disposition/instructions.  Malaiah Viramontes K. Alice Reichert, M.D. 04/30/2019, 5:51 PM

## 2019-04-30 NOTE — H&P (View-Only) (Signed)
[facility] Inpatient Progress Note  Subjective: Patient seen for f/u anemia, melena. Patient without bm this am. Still having back pain. Not conversive, appears somewhat confused. Wife is at bedside.   Objective: Vital signs in last 24 hours: Temp:  [97.9 F (36.6 C)-99.3 F (37.4 C)] 98.6 F (37 C) (09/10 1447) Pulse Rate:  [78-99] 82 (09/10 1447) Resp:  [14-20] 15 (09/10 1447) BP: (88-156)/(52-129) 156/129 (09/10 1447) SpO2:  [95 %-100 %] 99 % (09/10 1447) Weight:  [62 kg] 62 kg (09/09 2134) Blood pressure (!) 156/129, pulse 82, temperature 98.6 F (37 C), temperature source Oral, resp. rate 15, height 6' (1.829 m), weight 62 kg, SpO2 99 %.    Intake/Output from previous day: 09/09 0701 - 09/10 0700 In: 800 [P.O.:80; Blood:720] Out: -   Intake/Output this shift: Total I/O In: 840 [P.O.:840] Out: -    General appearance: Illl-appearing, cachectic male. Resp: Coarse bs's Cardio: RRR GI:  Soft, scaphoid, bs+ Extremities: No edema.   Lab Results: Results for orders placed or performed during the hospital encounter of 04/29/19 (from the past 24 hour(s))  Iron and TIBC     Status: Abnormal   Collection Time: 04/29/19  6:56 PM  Result Value Ref Range   Iron 50 45 - 182 ug/dL   TIBC 340 250 - 450 ug/dL   Saturation Ratios 15 (L) 17.9 - 39.5 %   UIBC 290 ug/dL  Ferritin     Status: None   Collection Time: 04/29/19  6:56 PM  Result Value Ref Range   Ferritin 209 24 - 336 ng/mL  Glucose, capillary     Status: Abnormal   Collection Time: 04/29/19  7:03 PM  Result Value Ref Range   Glucose-Capillary 110 (H) 70 - 99 mg/dL  Vitamin B12     Status: None   Collection Time: 04/29/19  7:39 PM  Result Value Ref Range   Vitamin B-12 547 180 - 914 pg/mL  Hemoglobin and hematocrit, blood     Status: Abnormal   Collection Time: 04/29/19  8:06 PM  Result Value Ref Range   Hemoglobin 7.0 (L) 13.0 - 17.0 g/dL   HCT 21.1 (L) 39.0 - 52.0 %  Glucose, capillary      Status: Abnormal   Collection Time: 04/29/19 10:25 PM  Result Value Ref Range   Glucose-Capillary 119 (H) 70 - 99 mg/dL  Basic metabolic panel     Status: Abnormal   Collection Time: 04/30/19  5:16 AM  Result Value Ref Range   Sodium 146 (H) 135 - 145 mmol/L   Potassium 3.5 3.5 - 5.1 mmol/L   Chloride 112 (H) 98 - 111 mmol/L   CO2 22 22 - 32 mmol/L   Glucose, Bld 122 (H) 70 - 99 mg/dL   BUN 91 (H) 8 - 23 mg/dL   Creatinine, Ser 1.47 (H) 0.61 - 1.24 mg/dL   Calcium 9.7 8.9 - 10.3 mg/dL   GFR calc non Af Amer 45 (L) >60 mL/min   GFR calc Af Amer 53 (L) >60 mL/min   Anion gap 12 5 - 15  CBC     Status: Abnormal   Collection Time: 04/30/19  5:16 AM  Result Value Ref Range   WBC 7.5 4.0 - 10.5 K/uL   RBC 1.96 (L) 4.22 - 5.81 MIL/uL   Hemoglobin 6.4 (L) 13.0 - 17.0 g/dL   HCT 19.4 (L) 39.0 - 52.0 %   MCV 99.0 80.0 - 100.0 fL   MCH 32.7 26.0 -  34.0 pg   MCHC 33.0 30.0 - 36.0 g/dL   RDW 17.9 (H) 11.5 - 15.5 %   Platelets 186 150 - 400 K/uL   nRBC 0.5 (H) 0.0 - 0.2 %  Glucose, capillary     Status: Abnormal   Collection Time: 04/30/19  7:45 AM  Result Value Ref Range   Glucose-Capillary 143 (H) 70 - 99 mg/dL  Prepare RBC     Status: None   Collection Time: 04/30/19 11:25 AM  Result Value Ref Range   Order Confirmation      ORDER PROCESSED BY BLOOD BANK Performed at [facility], [address] [place], [place] [address]   Glucose, capillary     Status: Abnormal   Collection Time: 04/30/19 11:54 AM  Result Value Ref Range   Glucose-Capillary 144 (H) 70 - 99 mg/dL  Glucose, capillary     Status: Abnormal   Collection Time: 04/30/19  4:49 PM  Result Value Ref Range   Glucose-Capillary 150 (H) 70 - 99 mg/dL     Recent Labs    04/29/19 1422 04/29/19 2006 04/30/19 0516  WBC 7.2  --  7.5  HGB 6.0* 7.0* 6.4*  HCT 18.5* 21.1* 19.4*  PLT 204  --  186   BMET Recent Labs    04/29/19 1422 04/30/19 0516  NA 143 146*  K 3.4* 3.5  CL 108 112*  CO2 23 22   GLUCOSE 146* 122*  BUN 88* 91*  CREATININE 1.72* 1.47*  CALCIUM 10.0 9.7   LFT Recent Labs    04/29/19 1422  PROT 6.0*  ALBUMIN 3.1*  AST 31  ALT 13  ALKPHOS 70  BILITOT 0.5   PT/INR No results for input(s): LABPROT, INR in the last 72 hours. Hepatitis Panel No results for input(s): HEPBSAG, HCVAB, HEPAIGM, HEPBIGM in the last 72 hours. C-Diff No results for input(s): CDIFFTOX in the last 72 hours. No results for input(s): CDIFFPCR in the last 72 hours.   Studies/Results: Ct Head Wo Contrast  Result Date: 04/29/2019 CLINICAL DATA:  Head trauma with headache. EXAM: CT HEAD WITHOUT CONTRAST TECHNIQUE: Contiguous axial images were obtained from the base of the skull through the vertex without intravenous contrast. COMPARISON:  None. FINDINGS: Brain: No evidence of acute infarction, hemorrhage, hydrocephalus, extra-axial collection or mass lesion/mass effect. Prominent brain parenchymal volume loss. Moderate deep white matter microangiopathy. Vascular: Calcific atherosclerotic disease. Skull: Normal. Negative for fracture or focal lesion. Sinuses/Orbits: No acute finding. Other: None. IMPRESSION: 1. No acute intracranial abnormality. 2. Atrophy, chronic microvascular disease. Electronically Signed   By: Fidela Salisbury M.D.   On: 04/29/2019 15:48    Scheduled Inpatient Medications:   . allopurinol  300 mg Oral Daily  . atorvastatin  10 mg Oral Q supper  . carvedilol  6.25 mg Oral BID WC  . feeding supplement  1 Container Oral TID BM  . fentaNYL  1 patch Transdermal Q72H  . insulin aspart  0-5 Units Subcutaneous QHS  . insulin aspart  0-9 Units Subcutaneous TID WC  . pantoprazole  40 mg Oral QAC breakfast  . polyethylene glycol powder  1 Container Oral Once  . vitamin C  500 mg Oral BID    Continuous Inpatient Infusions:   . sodium chloride 50 mL/hr at 04/30/19 1312    PRN Inpatient Medications:  acetaminophen **OR** acetaminophen, polyethylene glycol,  ziprasidone    Assessment: 1.  Metastatic prostate cancer- being seen by palliative care and oncology. 2.  Anemia secondary to gastrointestinal  blood loss-Hemoccult positive with anemia and markedly decreased hemoglobin over the past 2 months 3.  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 Patient wife, [name], confirmed date or partial colectomy to be November 24, 2007.  4.  Partial CODE STATUS with DO NOT INTUBATE  COVID-19 status:                                             Tested negative  Plan:  1. EGD and colonoscopy in AM. The patient's wife understands the nature of the planned procedure, indications, risks, alternatives and potential complications including but not limited to bleeding, infection, perforation, damage to internal organs and possible oversedation/side effects from anesthesia. The patient's wife agrees and gives consent to proceed.  Please refer to procedure notes for findings, recommendations and patient disposition/instructions.  [facility] K. Alice Reichert, M.D. 04/30/2019, 5:51 PM

## 2019-05-01 ENCOUNTER — Encounter: Payer: Self-pay | Admitting: Internal Medicine

## 2019-05-01 ENCOUNTER — Inpatient Hospital Stay: Payer: Medicare HMO | Admitting: Internal Medicine

## 2019-05-01 ENCOUNTER — Inpatient Hospital Stay: Payer: Medicare HMO | Admitting: Registered Nurse

## 2019-05-01 ENCOUNTER — Encounter
Admission: EM | Disposition: A | Discharge status: Hospice | Payer: Self-pay | Source: Home / Self Care | Attending: Internal Medicine

## 2019-05-01 ENCOUNTER — Inpatient Hospital Stay: Payer: Medicare HMO

## 2019-05-01 ENCOUNTER — Inpatient Hospital Stay: Payer: Medicare HMO | Admitting: Hospice and Palliative Medicine

## 2019-05-01 DIAGNOSIS — R634 Abnormal weight loss: Secondary | ICD-10-CM

## 2019-05-01 DIAGNOSIS — R4182 Altered mental status, unspecified: Secondary | ICD-10-CM

## 2019-05-01 DIAGNOSIS — E43 Unspecified severe protein-calorie malnutrition: Secondary | ICD-10-CM | POA: Insufficient documentation

## 2019-05-01 DIAGNOSIS — Z192 Hormone resistant malignancy status: Secondary | ICD-10-CM

## 2019-05-01 DIAGNOSIS — C7951 Secondary malignant neoplasm of bone: Secondary | ICD-10-CM

## 2019-05-01 DIAGNOSIS — G893 Neoplasm related pain (acute) (chronic): Secondary | ICD-10-CM

## 2019-05-01 DIAGNOSIS — C61 Malignant neoplasm of prostate: Secondary | ICD-10-CM

## 2019-05-01 DIAGNOSIS — G8928 Other chronic postprocedural pain: Secondary | ICD-10-CM

## 2019-05-01 DIAGNOSIS — D509 Iron deficiency anemia, unspecified: Secondary | ICD-10-CM

## 2019-05-01 HISTORY — PX: ESOPHAGOGASTRODUODENOSCOPY: SHX5428

## 2019-05-01 HISTORY — PX: COLONOSCOPY: SHX5424

## 2019-05-01 LAB — BASIC METABOLIC PANEL
Anion gap: 10 (ref 5–15)
BUN: 71 mg/dL — ABNORMAL HIGH (ref 8–23)
CO2: 23 mmol/L (ref 22–32)
Calcium: 9.3 mg/dL (ref 8.9–10.3)
Chloride: 114 mmol/L — ABNORMAL HIGH (ref 98–111)
Creatinine, Ser: 1.31 mg/dL — ABNORMAL HIGH (ref 0.61–1.24)
GFR calc Af Amer: >60 mL/min (ref >60)
GFR calc non Af Amer: 52 mL/min — ABNORMAL LOW (ref >60)
Glucose, Bld: 173 mg/dL — ABNORMAL HIGH (ref 70–99)
Potassium: 3.1 mmol/L — ABNORMAL LOW (ref 3.5–5.1)
Sodium: 147 mmol/L — ABNORMAL HIGH (ref 135–145)

## 2019-05-01 LAB — CBC
HCT: 28.9 % — ABNORMAL LOW (ref 39.0–52.0)
Hemoglobin: 9.7 g/dL — ABNORMAL LOW (ref 13.0–17.0)
MCH: 31.5 pg (ref 26.0–34.0)
MCHC: 33.6 g/dL (ref 30.0–36.0)
MCV: 93.8 fL (ref 80.0–100.0)
Platelets: 190 10*3/uL (ref 150–400)
RBC: 3.08 MIL/uL — ABNORMAL LOW (ref 4.22–5.81)
RDW: 17.5 % — ABNORMAL HIGH (ref 11.5–15.5)
WBC: 11.7 10*3/uL — ABNORMAL HIGH (ref 4.0–10.5)
nRBC: 0.6 % — ABNORMAL HIGH (ref 0.0–0.2)

## 2019-05-01 LAB — GLUCOSE, CAPILLARY
Glucose-Capillary: 166 mg/dL — ABNORMAL HIGH (ref 70–99)
Glucose-Capillary: 175 mg/dL — ABNORMAL HIGH (ref 70–99)
Glucose-Capillary: 198 mg/dL — ABNORMAL HIGH (ref 70–99)
Glucose-Capillary: 167 mg/dL — ABNORMAL HIGH (ref 70–99)
Glucose-Capillary: 154 mg/dL — ABNORMAL HIGH (ref 70–99)

## 2019-05-01 LAB — PSA: Prostatic Specific Antigen: 10.69 ng/mL — ABNORMAL HIGH (ref 0.00–4.00)

## 2019-05-01 LAB — RETICULOCYTES
Immature Retic Fract: 31.6 % — ABNORMAL HIGH (ref 2.3–15.9)
RBC.: 2.63 MIL/uL — ABNORMAL LOW (ref 4.22–5.81)
Retic Count, Absolute: 108.1 10*3/uL (ref 19.0–186.0)
Retic Ct Pct: 4.1 % — ABNORMAL HIGH (ref 0.4–3.1)

## 2019-05-01 LAB — LACTATE DEHYDROGENASE: LDH: 139 U/L (ref 98–192)

## 2019-05-01 LAB — HEMOGLOBIN: Hemoglobin: 8.6 g/dL — ABNORMAL LOW (ref 13.0–17.0)

## 2019-05-01 SURGERY — EGD (ESOPHAGOGASTRODUODENOSCOPY)
Anesthesia: General

## 2019-05-01 MED ORDER — FENTANYL 12 MCG/HR TD PT72
1.0000 | MEDICATED_PATCH | TRANSDERMAL | Status: DC
  Administered 2019-05-03 – 2019-05-06 (×2): 1 via TRANSDERMAL
  Filled 2019-05-01 (×3): qty 1

## 2019-05-01 MED ORDER — POLYETHYLENE GLYCOL 3350 17 GM/SCOOP PO POWD
1.0000 | Freq: Once | ORAL | Status: AC
  Administered 2019-05-01: 255 g via ORAL

## 2019-05-01 MED ORDER — PROPOFOL 500 MG/50ML IV EMUL
INTRAVENOUS | Status: DC | PRN
  Administered 2019-05-01: 100 ug/kg/min via INTRAVENOUS

## 2019-05-01 MED ORDER — PROPOFOL 10 MG/ML IV BOLUS
INTRAVENOUS | Status: DC | PRN
  Administered 2019-05-01: 70 mg via INTRAVENOUS

## 2019-05-01 MED ORDER — PHENYLEPHRINE HCL (PRESSORS) 10 MG/ML IV SOLN
INTRAVENOUS | Status: DC | PRN
  Administered 2019-05-01: 100 ug via INTRAVENOUS

## 2019-05-01 MED ORDER — SODIUM CHLORIDE 0.45 % IV SOLN
INTRAVENOUS | Status: DC
  Administered 2019-05-01 (×2): via INTRAVENOUS

## 2019-05-01 MED ORDER — SODIUM CHLORIDE 0.9% IV SOLUTION
Freq: Once | INTRAVENOUS | Status: AC
  Administered 2019-05-01: 02:00:00 via INTRAVENOUS

## 2019-05-01 MED ORDER — POLYETHYLENE GLYCOL 3350 17 GM/SCOOP PO POWD
1.0000 | Freq: Once | ORAL | Status: DC
  Filled 2019-05-01: qty 255

## 2019-05-01 MED ORDER — SODIUM CHLORIDE 0.9 % IV BOLUS
500.0000 mL | Freq: Once | INTRAVENOUS | Status: AC
  Administered 2019-05-01: 01:00:00 500 mL via INTRAVENOUS

## 2019-05-01 MED ORDER — PROPOFOL 500 MG/50ML IV EMUL
INTRAVENOUS | Status: AC
  Filled 2019-05-01: qty 50

## 2019-05-01 MED ORDER — PEG 3350-KCL-NA BICARB-NACL 420 G PO SOLR
4000.0000 mL | Freq: Once | ORAL | Status: DC
  Filled 2019-05-01: qty 4000

## 2019-05-01 MED ORDER — ENSURE ENLIVE PO LIQD
237.0000 mL | Freq: Three times a day (TID) | ORAL | Status: DC
  Administered 2019-05-01 – 2019-05-07 (×7): 237 mL via ORAL

## 2019-05-01 MED ORDER — SODIUM CHLORIDE 0.9 % IV SOLN: INTRAVENOUS | Status: DC

## 2019-05-01 MED ORDER — POTASSIUM CHLORIDE 10 MEQ/100ML IV SOLN
10.0000 meq | INTRAVENOUS | Status: AC
  Administered 2019-05-01 (×4): 10 meq via INTRAVENOUS
  Filled 2019-05-01 (×5): qty 100

## 2019-05-01 NOTE — Progress Notes (Signed)
[facility] [place] NAME: [name]    MR#:  [id number]  DATE OF BIRTH:  1941-07-26  SUBJECTIVE:  CHIEF COMPLAINT:   Chief Complaint  Patient presents with  . Weakness   -Received 2 units transfusion since admission.  No active bleeding noted.  EGD is normal today.  Did not tolerate colon prep -Patient is alert but not interacting well.  Wife at bedside  REVIEW OF SYSTEMS:  Review of Systems  Unable to perform ROS: Mental acuity    DRUG ALLERGIES:  No Known Allergies  VITALS:  Blood pressure (!) 97/53, pulse 88, temperature (!) 100.7 F (38.2 C), resp. rate (!) 22, height 6' (1.829 m), weight 62 kg, SpO2 99 %.  PHYSICAL EXAMINATION:  Physical Exam   GENERAL:  78 y.o.-year-old thin built well-nourished patient lying in the bed and appears to be restless.  EYES: Pupils equal, round, reactive to light and accommodation. No scleral icterus. Extraocular muscles intact.  HEENT: Head atraumatic, normocephalic. Oropharynx and nasopharynx clear.  Dry mucous membranes NECK:  Supple, no jugular venous distention. No thyroid enlargement, no tenderness.  LUNGS: Normal breath sounds bilaterally, no wheezing, rales,rhonchi or crepitation. No use of accessory muscles of respiration.  Decreased bibasilar breath sounds CARDIOVASCULAR: S1, S2 normal. No  rubs, or gallops.  3/6 systolic murmur is present ABDOMEN: Soft, nontender, nondistended. Bowel sounds present. No organomegaly or mass.  EXTREMITIES: No pedal edema, cyanosis, or clubbing.  NEUROLOGIC:  speech is not understandable as patient does not have dentures.  Not following simple commands.  Sensation is intact and withdrawing to pain and touch.  PSYCHIATRIC: The patient is alert but does not interact much SKIN: No obvious rash, lesion, or ulcer.    LABORATORY PANEL:   CBC Recent Labs  Lab 05/01/19 0641  WBC 11.7*  HGB 9.7*  HCT 28.9*  PLT 190    ------------------------------------------------------------------------------------------------------------------  Chemistries  Recent Labs  Lab 04/29/19 1422  05/01/19 0641  NA 143   < > 147*  K 3.4*   < > 3.1*  CL 108   < > 114*  CO2 23   < > 23  GLUCOSE 146*   < > 173*  BUN 88*   < > 71*  CREATININE 1.72*   < > 1.31*  CALCIUM 10.0   < > 9.3  MG 1.9  --   --   AST 31  --   --   ALT 13  --   --   ALKPHOS 70  --   --   BILITOT 0.5  --   --    < > = values in this interval not displayed.   ------------------------------------------------------------------------------------------------------------------  Cardiac Enzymes No results for input(s): TROPONINI in the last 168 hours. ------------------------------------------------------------------------------------------------------------------  RADIOLOGY:  Ct Head Wo Contrast  Result Date: 04/29/2019 CLINICAL DATA:  Head trauma with headache. EXAM: CT HEAD WITHOUT CONTRAST TECHNIQUE: Contiguous axial images were obtained from the base of the skull through the vertex without intravenous contrast. COMPARISON:  None. FINDINGS: Brain: No evidence of acute infarction, hemorrhage, hydrocephalus, extra-axial collection or mass lesion/mass effect. Prominent brain parenchymal volume loss. Moderate deep white matter microangiopathy. Vascular: Calcific atherosclerotic disease. Skull: Normal. Negative for fracture or focal lesion. Sinuses/Orbits: No acute finding. Other: None. IMPRESSION: 1. No acute intracranial abnormality. 2. Atrophy, chronic microvascular disease. Electronically Signed   By: Timothy Salisbury M.D.   On: 04/29/2019 15:48    EKG:   Orders placed or  performed during the hospital encounter of 04/29/19  . ED EKG  . 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  1.  Acute on chronic anemia-likely anemia of chronic disease, iron levels within normal limits.  B12 greater than 500 -Hemoglobin 6.0 on admission, baseline is at 10. -Received a total of 2 units packed RBC transfusion since admission.  Hemoglobin this morning is 9.7.  Recheck later today as it increased much more than expected -GI has been consulted.  EGD done today, no active source of bleeding identified.  Patient could not tolerate colon prep. -On full liquid diet today.  Continue Protonix orally.  2.  Metastatic prostate cancer-patient has stage IV prostate cancer currently on Xtandi. -Overall clinically doing very poorly.  Weight loss, generalized weakness and now with acute anemia. -Palliative care consulted.  Oncology to follow in the hospital as well.  3.  Chronic pain from metastatic prostate cancer to balance -Restarted his fentanyl patch this morning. If needed will add oxycodone as needed for pain-however already appears sleeping most of the time  4.  Hypertension-on low-dose Coreg  5.  DVT prophylaxis-teds and SCDs only  Wife updated at bedside   All the records are reviewed and case discussed with Care Management/Social Workerr. Management plans discussed with the patient, family and they are in agreement.  CODE STATUS: Partial code  TOTAL TIME TAKING CARE OF THIS PATIENT: 37 minutes.   POSSIBLE D/C IN 3-4 DAYS, DEPENDING ON CLINICAL CONDITION.   Timothy Lighter M.D on 05/01/2019 at 9:42 AM  Between 7am to 6pm - Pager - [phone]  After 6pm go to [email or web address] - password [email or web address] [facility]  Office  [phone]  CC: Primary care physician; Tamsen Roers, MD

## 2019-05-01 NOTE — Progress Notes (Addendum)
[place]  [phone] [phone]   Name: [name] Date: 05/01/2019 MRN: [id number]  DOB: 02-Sep-1940  Patient Care Team: Tamsen Roers, MD as PCP - General (Family Medicine) Abbie Sons, MD as Consulting Physician (Urology)    REASON FOR CONSULTATION: Palliative Care consult requested for this77 y.o.malewith multiple medical problems including stage IV prostate cancer metastatic to bone status post RT and surgery most recently treated on Xtandi. PMH also notable for hydronephrosis status post stenting, moderate aortic stenosis, hypertension, diabetes. Patient was admitted to the hospital on 04/29/19 with symptomatic anemia from GI bleed. He has been evaluated by GI with plan for EGD and colonoscopy.He was referred to palliative care for help address goals.  CODE STATUS: Limited code  PAST MEDICAL HISTORY: Past Medical History:  Diagnosis Date  . Aortic stenosis, moderate 01/30/2014  . Appetite loss   . Arthritis   . Atrial flutter ([place]) 02/02/2014  . Back pain   . Diabetes mellitus without complication (HCC)    diet control  . GERD (gastroesophageal reflux disease)   . Gout   . Heart murmur   . Heart murmur   . History of kidney stones   . HTN (hypertension) 01/30/2014  . Hypercholesteremia   . Hypertension   . Prostate cancer ([place])    metastatic  . Weakness of left leg     PAST SURGICAL HISTORY:  Past Surgical History:  Procedure Laterality Date  . 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 CATARACT EXTRACTION W/ INTRAOCULAR LENS IMPLANT Bilateral   . COLON RESECTION    . CYSTOSCOPY W/ RETROGRADES Left 06/08/2015   Procedure: CYSTOSCOPY WITH RETROGRADE PYELOGRAM;  Surgeon: Nickie Retort, MD;  Location: [facility];  Service: Urology;  Laterality: Left;  . CYSTOSCOPY W/  RETROGRADES Left 05/13/2018   Procedure: CYSTOSCOPY WITH RETROGRADE PYELOGRAM;  Surgeon: Abbie Sons, MD;  Location: [facility];  Service: Urology;  Laterality: Left;  . CYSTOSCOPY W/ RETROGRADES Left 02/03/2019   Procedure: CYSTOSCOPY WITH RETROGRADE PYELOGRAM;  Surgeon: Abbie Sons, MD;  Location: [facility];  Service: Urology;  Laterality: Left;  . CYSTOSCOPY W/ URETERAL STENT PLACEMENT Left 10/05/2015   Procedure: CYSTOSCOPY WITH STENT REPLACEMENT;  Surgeon: Nickie Retort, MD;  Location: [facility];  Service: Urology;  Laterality: Left;  . CYSTOSCOPY W/ URETERAL STENT PLACEMENT Left 01/04/2016   Procedure: CYSTOSCOPY WITH STENT REPLACEMENT;  Surgeon: Nickie Retort, MD;  Location: [facility];  Service: Urology;  Laterality: Left;  . CYSTOSCOPY W/ URETERAL STENT PLACEMENT Left 04/11/2016   Procedure: CYSTOSCOPY WITH STENT REPLACEMENT;  Surgeon: Nickie Retort, MD;  Location: [facility];  Service: Urology;  Laterality: Left;  . CYSTOSCOPY W/ URETERAL STENT PLACEMENT Left 07/18/2016   Procedure: CYSTOSCOPY WITH STENT REPLACEMENT;  Surgeon: Nickie Retort, MD;  Location: [facility];  Service: Urology;  Laterality: Left;  . CYSTOSCOPY W/ URETERAL STENT PLACEMENT Left 11/02/2016   Procedure: CYSTOSCOPY WITH STENT REPLACEMENT;  Surgeon: Nickie Retort, MD;  Location: [facility];  Service: Urology;  Laterality: Left;  . CYSTOSCOPY W/ URETERAL STENT PLACEMENT Left 02/08/2017   Procedure: CYSTOSCOPY WITH STENT REPLACEMENT;  Surgeon: Nickie Retort, MD;  Location: [facility];  Service: Urology;  Laterality: Left;  . CYSTOSCOPY W/ URETERAL STENT PLACEMENT Left 05/10/2017   Procedure: CYSTOSCOPY WITH STENT REPLACEMENT;  Surgeon: Baruch Gouty  Jeneen Rinks, MD;  Location: [facility];  Service: Urology;  Laterality: Left;  . CYSTOSCOPY W/ URETERAL STENT PLACEMENT Left 05/13/2018   Procedure: CYSTOSCOPY WITH STENT Exchange;  Surgeon: Abbie Sons, MD;  Location: [facility];  Service: Urology;  Laterality:  Left;  . CYSTOSCOPY W/ URETERAL STENT PLACEMENT Left 02/03/2019   Procedure: CYSTOSCOPY WITH STENT REPLACEMENT;  Surgeon: Abbie Sons, MD;  Location: [facility];  Service: Urology;  Laterality: Left;  . CYSTOSCOPY WITH STENT PLACEMENT Left 08/16/2017   Procedure: CYSTOSCOPY WITH STENT EXCHANGE;  Surgeon: Abbie Sons, MD;  Location: [facility];  Service: Urology;  Laterality: Left;  . CYSTOSCOPY WITH STENT PLACEMENT Left 12/17/2017   Procedure: CYSTOSCOPY WITH STENT EXCHANGE;  Surgeon: Abbie Sons, MD;  Location: [facility];  Service: Urology;  Laterality: Left;  . EYE SURGERY Bilateral Sept and Oct.2012   cataract  . HERNIA REPAIR    . PROSTATECTOMY  1994    HEMATOLOGY/ONCOLOGY HISTORY:  Oncology History Overview Note  # AUG 2016- METASTATIC PROSTATE CA/ STAGE IV; Castrate sensitive [PSA- 48] ; mets- lumbar spine [s/p pal RT to Aug 2016; Dr.Crystal] /Pelvic LN; 1994- Prostate CA s/p Surgery [facility]]; Lupron q4 M; MARCH 2017- Bone scan- L4/Right pubic rami uptake; PSA- 0.1/testosterone-castrate [<3]  # Bone lesions on denosumab;DEC 2016- Recm q 70M  # NOV 2018- CASTRATE RESISTANT; declined chemo; 08/06/2017- start Zytiga+prednisone; Stopped April 24th [rising PSA];  # may 2019- Xofigo #6 on 10/16]  # OCT 16th 2019- X-tandi  # Left hydronephrosis s/p stenting [Dr.Budzyn] --------------------------------------------   DIAGNOSIS: [ ]  castrate resistant prostate cancer  STAGE:  IV       ;GOALS: palliative  CURRENT/MOST RECENT THERAPY ; Xtandi   Prostate cancer metastatic to bone [facility])    ALLERGIES:  has No Known Allergies.  MEDICATIONS:  Current Facility-Administered Medications  Medication Dose Route Frequency Provider Last Rate Last Dose  . 0.45 % sodium chloride infusion   Intravenous Continuous [name], MD 50 mL/hr at 05/01/19 1007    . acetaminophen (TYLENOL) tablet 650 mg  650 mg Oral Q6H PRN Hillary Bow, MD   650 mg at 04/30/19 2154   Or  .  acetaminophen (TYLENOL) suppository 650 mg  650 mg Rectal Q6H PRN Sudini, Alveta Heimlich, MD      . allopurinol (ZYLOPRIM) tablet 300 mg  300 mg Oral Daily Hillary Bow, MD   300 mg at 05/01/19 1006  . atorvastatin (LIPITOR) tablet 10 mg  10 mg Oral Q supper Hillary Bow, MD   10 mg at 04/30/19 1738  . carvedilol (COREG) tablet 6.25 mg  6.25 mg Oral BID WC [name], MD   6.25 mg at 05/01/19 0800  . feeding supplement (ENSURE ENLIVE) (ENSURE ENLIVE) liquid 237 mL  237 mL Oral TID BM [name], MD   237 mL at 05/01/19 1006  . fentaNYL (DURAGESIC) 12 MCG/HR 1 patch  1 patch Transdermal Q72H [name], MD      . insulin aspart (novoLOG) injection 0-5 Units  0-5 Units Subcutaneous QHS Sudini, Srikar, MD      . insulin aspart (novoLOG) injection 0-9 Units  0-9 Units Subcutaneous TID WC Hillary Bow, MD   2 Units at 05/01/19 1203  . pantoprazole (PROTONIX) EC tablet 40 mg  40 mg Oral QAC breakfast Hillary Bow, MD   40 mg at 04/30/19 0904  . polyethylene glycol (MIRALAX / GLYCOLAX) packet 17 g  17 g Oral Daily PRN Hillary Bow, MD      .  polyethylene glycol powder (GLYCOLAX/MIRALAX) container 255 g  1 Container Oral Once [place], MD      . vitamin C (ASCORBIC ACID) tablet 500 mg  500 mg Oral BID [name], MD   500 mg at 04/30/19 2013  . ziprasidone (GEODON) injection 10 mg  10 mg Intramuscular Q6H PRN Mansy, Jan A, MD   10 mg at 04/30/19 0624    VITAL SIGNS: BP 92/63 (BP Location: Right Arm)   Pulse 89   Temp 98.6 F (37 C) (Oral)   Resp 16   Ht 6' (1.829 m)   Wt 136 lb 11.2 oz (62 kg)   SpO2 100%   BMI 18.54 kg/m  Filed Weights   04/29/19 1416 04/29/19 2134  Weight: 154 lb 5.2 oz (70 kg) 136 lb 11.2 oz (62 kg)    Estimated body mass index is 18.54 kg/m as calculated from the following:   Height as of this encounter: 6' (1.829 m).   Weight as of this encounter: 136 lb 11.2 oz (62 kg).  LABS: CBC:    Component Value Date/Time   WBC 11.7  (H) 05/01/2019 0641   HGB 8.6 (L) 05/01/2019 1411   HCT 28.9 (L) 05/01/2019 0641   PLT 190 05/01/2019 0641   MCV 93.8 05/01/2019 0641   NEUTROABS 5.6 04/29/2019 1422   LYMPHSABS 0.8 04/29/2019 1422   MONOABS 0.6 04/29/2019 1422   EOSABS 0.1 04/29/2019 1422   BASOSABS 0.1 04/29/2019 1422   Comprehensive Metabolic Panel:    Component Value Date/Time   NA 147 (H) 05/01/2019 0641   K 3.1 (L) 05/01/2019 0641   CL 114 (H) 05/01/2019 0641   CO2 23 05/01/2019 0641   BUN 71 (H) 05/01/2019 0641   CREATININE 1.31 (H) 05/01/2019 0641   GLUCOSE 173 (H) 05/01/2019 0641   CALCIUM 9.3 05/01/2019 0641   AST 31 04/29/2019 1422   ALT 13 04/29/2019 1422   ALKPHOS 70 04/29/2019 1422   BILITOT 0.5 04/29/2019 1422   PROT 6.0 (L) 04/29/2019 1422   ALBUMIN 3.1 (L) 04/29/2019 1422    RADIOGRAPHIC STUDIES: Ct Head Wo Contrast  Result Date: 04/29/2019 CLINICAL DATA:  Head trauma with headache. EXAM: CT HEAD WITHOUT CONTRAST TECHNIQUE: Contiguous axial images were obtained from the base of the skull through the vertex without intravenous contrast. COMPARISON:  None. FINDINGS: Brain: No evidence of acute infarction, hemorrhage, hydrocephalus, extra-axial collection or mass lesion/mass effect. Prominent brain parenchymal volume loss. Moderate deep white matter microangiopathy. Vascular: Calcific atherosclerotic disease. Skull: Normal. Negative for fracture or focal lesion. Sinuses/Orbits: No acute finding. Other: None. IMPRESSION: 1. No acute intracranial abnormality. 2. Atrophy, chronic microvascular disease. Electronically Signed   By: Fidela Salisbury M.D.   On: 04/29/2019 15:48    PERFORMANCE STATUS (ECOG) : 3 - Symptomatic, >50% confined to bed  Review of Systems Unless otherwise noted, a complete review of systems is negative.  Physical Exam General: frail appearing, thin Pulmonary: unlabored Extremities: no edema, no joint deformities Skin: no rashes Neurological: Weakness but otherwise  nonfocal  IMPRESSION: Patient is more alert this afternoon. He is comfortable appearing. He is s/p EGD with no evidence of active bleeding. He was unable to have colonoscopy due to presence of stool. Wife is currently at bedside.   Patient seemed to recognize me and participated some in a conversation, although wife remained the primary person answering questions. Wife wants to speak with Dr. Rogue Bussing about treatment options and plan. We again discussed home resources. I would  recommend home with palliative care if wife/patient desire home health.  I again discussed code status. Wife says she is still thinking about it. She asked that I speak with patient's son, Elber Goscinski, whom I tried unsuccessfully to call [phone]). Wife says that patient reconciled with his son about two months ago. They had been previously estranged. Wife would be interested in son participating in decision making.   Case discussed with Dr. Rogue Bussing.   PLAN: -Continue current scope of treatment -Recommend home health with home-based palliative care -Will plan to follow in the clinic and continue to address goals     Time Total: 20 minutes  Visit consisted of counseling and education dealing with the complex and emotionally intense issues of symptom management and palliative care in the setting of serious and potentially life-threatening illness.Greater than 50%  of this time was spent counseling and coordinating care related to the above assessment and plan.  Signed by: Altha Harm, PhD, NP-C [phone] (Work Cell)

## 2019-05-01 NOTE — H&P (View-Only) (Signed)
GI Inpatient Follow-up Note  Subjective:  Patient seen in f/u for anemia and melena. Wife is present at bedside. Pt is not conversive and appears lethargic. EGD performed this morning showing normal esophagus, gastritis, gastric diverticulum, and normal examined duodenum. No specimens were completed. Colonoscopy was not attempted due to inadequate bowel prep. Wife reports he has not had a BM today. There appears to be no overt abdominal pain, vomiting, or rectal bleeding. Hemoglobin 9.7 this morning. He is s/p total of 3 units of blood.   Scheduled Inpatient Medications:  . allopurinol  300 mg Oral Daily  . atorvastatin  10 mg Oral Q supper  . carvedilol  6.25 mg Oral BID WC  . feeding supplement (ENSURE ENLIVE)  237 mL Oral TID BM  . fentaNYL  1 patch Transdermal Q72H  . insulin aspart  0-5 Units Subcutaneous QHS  . insulin aspart  0-9 Units Subcutaneous TID WC  . pantoprazole  40 mg Oral QAC breakfast  . polyethylene glycol powder  1 Container Oral Once  . vitamin C  500 mg Oral BID    Continuous Inpatient Infusions:   . sodium chloride 50 mL/hr at 05/01/19 1007    PRN Inpatient Medications:  acetaminophen **OR** acetaminophen, polyethylene glycol, ziprasidone  Review of Systems: Constitutional: Weight is stable.  Eyes: No changes in vision. ENT: No oral lesions, sore throat.  GI: see HPI.  Heme/Lymph: No easy bruising.  CV: No chest pain.  GU: No hematuria.  Integumentary: No rashes.  Neuro: No headaches.  Psych: No depression/anxiety.  Endocrine: No heat/cold intolerance.  Allergic/Immunologic: No urticaria.  Resp: No cough, SOB.  Musculoskeletal: No joint swelling.    Physical Examination: BP 92/63 (BP Location: Right Arm)   Pulse 89   Temp 98.6 F (37 C) (Oral)   Resp 16   Ht 6' (1.829 m)   Wt 62 kg   SpO2 100%   BMI 18.54 kg/m  Lethagic, somnolent elderly male in hospital bed. Wife present at bedside.  Gen: NAD, alert and oriented x 4 HEENT: PEERLA,  EOMI, Neck: supple, no JVD or thyromegaly Chest: CTA bilaterally, no wheezes, crackles, or other adventitious sounds CV: RRR, no m/g/c/r Abd: soft, NT, ND, +BS in all four quadrants; no HSM, guarding, ridigity, or rebound tenderness Ext: no edema, well perfused with 2+ pulses, Skin: no rash or lesions noted Lymph: no LAD  Data: Lab Results  Component Value Date   WBC 11.7 (H) 05/01/2019   HGB 8.6 (L) 05/01/2019   HCT 28.9 (L) 05/01/2019   MCV 93.8 05/01/2019   PLT 190 05/01/2019   Recent Labs  Lab 04/30/19 2010 05/01/19 0641 05/01/19 1411  HGB 7.8* 9.7* 8.6*   Lab Results  Component Value Date   NA 147 (H) 05/01/2019   K 3.1 (L) 05/01/2019   CL 114 (H) 05/01/2019   CO2 23 05/01/2019   BUN 71 (H) 05/01/2019   CREATININE 1.31 (H) 05/01/2019   Lab Results  Component Value Date   ALT 13 04/29/2019   AST 31 04/29/2019   ALKPHOS 70 04/29/2019   BILITOT 0.5 04/29/2019   No results for input(s): APTT, INR, PTT in the last 168 hours.  Assessment  1.  Metastatic prostate cancer- being seen by palliative care and oncology. 2.  Anemia secondary to gastrointestinal blood loss-Hemoccult positive with anemia and markedly decreased hemoglobin over the past 2 months 3.  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  Partial CODE STATUS with DO NOT INTUBATE  Plan: 1. EGD performed today with no signs of active or occult upper GI bleeding. Colonoscopy not performed due to inadequate prep 2. Will plan for colonoscopy tomorrow. Bowel prep process will be difficult due to patient's clinical status. 3. Clear liquid diet. NPO after midnight 4. Continue to monitor H&H. Transfuse for Hgb <7.0. 5. Continue care per hospital team 6. Further recommendations after procedure   Please call with questions or concerns.    Octavia Bruckner, PA-C [facility] [phone] (Cell)

## 2019-05-01 NOTE — Anesthesia Preprocedure Evaluation (Signed)
Anesthesia Evaluation  Patient identified by MRN, date of birth, ID band Patient awake    Reviewed: Allergy & Precautions, H&P , NPO status , Patient's Chart, lab work & pertinent test results, reviewed documented beta blocker date and time   Airway Mallampati: II   Neck ROM: full    Dental  (+) Poor Dentition   Pulmonary pneumonia, former smoker,    Pulmonary exam normal        Cardiovascular Exercise Tolerance: Poor hypertension, On Medications + Past MI  + dysrhythmias Atrial Fibrillation + Valvular Problems/Murmurs AS  Rhythm:regular Rate:Normal     Neuro/Psych Dementia confused negative neurological ROS  negative psych ROS   GI/Hepatic Neg liver ROS, GERD  Medicated,  Endo/Other  negative endocrine ROSdiabetes  Renal/GU Renal disease  negative genitourinary   Musculoskeletal   Abdominal   Peds  Hematology  (+) Blood dyscrasia, anemia ,   Anesthesia Other Findings Past Medical History: 01/30/2014: Aortic stenosis, moderate No date: Appetite loss No date: Arthritis 02/02/2014: Atrial flutter (HCC) No date: Back pain No date: Diabetes mellitus without complication (HCC)     Comment:  diet control No date: GERD (gastroesophageal reflux disease) No date: Gout No date: Heart murmur No date: Heart murmur No date: History of kidney stones 01/30/2014: HTN (hypertension) No date: Hypercholesteremia No date: Hypertension No date: Prostate cancer ([place])     Comment:  metastatic No date: Weakness of left leg Past Surgical History: 08/29/2018: AMPUTATION TOE; Right     Comment:  Procedure: Toe IPJ Right;  Surgeon: Albertine Patricia,               DPM;  Location: [facility];  Service: Podiatry;                Laterality: Right; No date: CATARACT EXTRACTION W/ INTRAOCULAR LENS IMPLANT; Bilateral No date: COLON RESECTION 06/08/2015: CYSTOSCOPY W/ RETROGRADES; Left     Comment:  Procedure: CYSTOSCOPY WITH RETROGRADE  PYELOGRAM;                Surgeon: Nickie Retort, MD;  Location: [facility];                Service: Urology;  Laterality: Left; 05/13/2018: CYSTOSCOPY W/ RETROGRADES; Left     Comment:  Procedure: CYSTOSCOPY WITH RETROGRADE PYELOGRAM;                Surgeon: Abbie Sons, MD;  Location: [facility];                Service: Urology;  Laterality: Left; 02/03/2019: CYSTOSCOPY W/ RETROGRADES; Left     Comment:  Procedure: CYSTOSCOPY WITH RETROGRADE PYELOGRAM;                Surgeon: Abbie Sons, MD;  Location: [facility];                Service: Urology;  Laterality: Left; 10/05/2015: CYSTOSCOPY W/ URETERAL STENT PLACEMENT; Left     Comment:  Procedure: CYSTOSCOPY WITH STENT REPLACEMENT;  Surgeon:               Nickie Retort, MD;  Location: [facility];  Service:               Urology;  Laterality: Left; 01/04/2016: CYSTOSCOPY W/ URETERAL STENT PLACEMENT; Left     Comment:  Procedure: CYSTOSCOPY WITH STENT REPLACEMENT;  Surgeon:               Nickie Retort, MD;  Location: [facility];  Service:               Urology;  Laterality: Left; 04/11/2016: CYSTOSCOPY W/ URETERAL STENT PLACEMENT; Left     Comment:  Procedure: CYSTOSCOPY WITH STENT REPLACEMENT;  Surgeon:               Nickie Retort, MD;  Location: [facility];  Service:               Urology;  Laterality: Left; 07/18/2016: CYSTOSCOPY W/ URETERAL STENT PLACEMENT; Left     Comment:  Procedure: CYSTOSCOPY WITH STENT REPLACEMENT;  Surgeon:               Nickie Retort, MD;  Location: [facility];  Service:               Urology;  Laterality: Left; 11/02/2016: CYSTOSCOPY W/ URETERAL STENT PLACEMENT; Left     Comment:  Procedure: CYSTOSCOPY WITH STENT REPLACEMENT;  Surgeon:               Nickie Retort, MD;  Location: [facility];  Service:               Urology;  Laterality: Left; 02/08/2017: CYSTOSCOPY W/ URETERAL STENT PLACEMENT; Left     Comment:  Procedure: CYSTOSCOPY WITH STENT REPLACEMENT;  Surgeon:               Nickie Retort, MD;  Location: [facility];  Service:               Urology;  Laterality: Left; 05/10/2017: CYSTOSCOPY W/ URETERAL STENT PLACEMENT; Left     Comment:  Procedure: CYSTOSCOPY WITH STENT REPLACEMENT;  Surgeon:               Nickie Retort, MD;  Location: [facility];  Service:               Urology;  Laterality: Left; 05/13/2018: CYSTOSCOPY W/ URETERAL STENT PLACEMENT; Left     Comment:  Procedure: CYSTOSCOPY WITH STENT Exchange;  Surgeon:               Abbie Sons, MD;  Location: [facility];  Service:               Urology;  Laterality: Left; 02/03/2019: CYSTOSCOPY W/ URETERAL STENT PLACEMENT; Left     Comment:  Procedure: CYSTOSCOPY WITH STENT REPLACEMENT;  Surgeon:               Abbie Sons, MD;  Location: [facility];  Service:               Urology;  Laterality: Left; 08/16/2017: CYSTOSCOPY WITH STENT PLACEMENT; Left     Comment:  Procedure: CYSTOSCOPY WITH STENT EXCHANGE;  Surgeon:               Abbie Sons, MD;  Location: [facility];  Service:               Urology;  Laterality: Left; 12/17/2017: CYSTOSCOPY WITH STENT PLACEMENT; Left     Comment:  Procedure: CYSTOSCOPY WITH STENT EXCHANGE;  Surgeon:               Abbie Sons, MD;  Location: [facility];  Service:               Urology;  Laterality: Left; Sept and Oct.2012: EYE SURGERY; Bilateral     Comment:  cataract No date: HERNIA REPAIR 1994: PROSTATECTOMY BMI  Body Mass Index: 18.54 kg/m     Reproductive/Obstetrics negative OB ROS                             Anesthesia Physical Anesthesia Plan  ASA: III  Anesthesia Plan: General   Post-op Pain Management:    Induction:   PONV Risk Score and Plan:   Airway Management Planned:   Additional Equipment:   Intra-op Plan:   Post-operative Plan:   Informed Consent: I have reviewed the patients History and Physical, chart, labs and discussed the procedure including the risks, benefits and alternatives for the proposed  anesthesia with the patient or authorized representative who has indicated his/her understanding and acceptance.     Dental Advisory Given  Plan Discussed with: CRNA  Anesthesia Plan Comments:         Anesthesia Quick Evaluation

## 2019-05-01 NOTE — Progress Notes (Signed)
PT Cancellation Note  Patient Details Name: [name] MRN: [id number] DOB: [birth year]   Cancelled Treatment:    Reason Eval/Treat Not Completed: Patient at procedure or test/unavailable(Patient consult received and reviewed. Patient at procedure. Will review and attempt again at a later time based on patient status.)  Janna Arch, PT, DPT   05/01/2019, 8:27 AM

## 2019-05-01 NOTE — Care Management Important Message (Signed)
Important Message  Patient Details  Name: [name] MRN: [id number] Date of Birth: 04-17-1941   Medicare Important Message Given:  Yes     Dannette Barbara 05/01/2019, 12:32 PM

## 2019-05-01 NOTE — Progress Notes (Signed)
GI Inpatient Follow-up Note  Subjective:  Patient seen in f/u for anemia and melena. Wife is present at bedside. Pt is not conversive and appears lethargic. EGD performed this morning showing normal esophagus, gastritis, gastric diverticulum, and normal examined duodenum. No specimens were completed. Colonoscopy was not attempted due to inadequate bowel prep. Wife reports he has not had a BM today. There appears to be no overt abdominal pain, vomiting, or rectal bleeding. Hemoglobin 9.7 this morning. He is s/p total of 3 units of blood.   Scheduled Inpatient Medications:  . allopurinol  300 mg Oral Daily  . atorvastatin  10 mg Oral Q supper  . carvedilol  6.25 mg Oral BID WC  . feeding supplement (ENSURE ENLIVE)  237 mL Oral TID BM  . fentaNYL  1 patch Transdermal Q72H  . insulin aspart  0-5 Units Subcutaneous QHS  . insulin aspart  0-9 Units Subcutaneous TID WC  . pantoprazole  40 mg Oral QAC breakfast  . polyethylene glycol powder  1 Container Oral Once  . vitamin C  500 mg Oral BID    Continuous Inpatient Infusions:   . sodium chloride 50 mL/hr at 05/01/19 1007    PRN Inpatient Medications:  acetaminophen **OR** acetaminophen, polyethylene glycol, ziprasidone  Review of Systems: Constitutional: Weight is stable.  Eyes: No changes in vision. ENT: No oral lesions, sore throat.  GI: see HPI.  Heme/Lymph: No easy bruising.  CV: No chest pain.  GU: No hematuria.  Integumentary: No rashes.  Neuro: No headaches.  Psych: No depression/anxiety.  Endocrine: No heat/cold intolerance.  Allergic/Immunologic: No urticaria.  Resp: No cough, SOB.  Musculoskeletal: No joint swelling.    Physical Examination: BP 92/63 (BP Location: Right Arm)   Pulse 89   Temp 98.6 F (37 C) (Oral)   Resp 16   Ht 6' (1.829 m)   Wt 62 kg   SpO2 100%   BMI 18.54 kg/m  Lethagic, somnolent elderly male in hospital bed. Wife present at bedside.  Gen: NAD, alert and oriented x 4 HEENT: PEERLA,  EOMI, Neck: supple, no JVD or thyromegaly Chest: CTA bilaterally, no wheezes, crackles, or other adventitious sounds CV: RRR, no m/g/c/r Abd: soft, NT, ND, +BS in all four quadrants; no HSM, guarding, ridigity, or rebound tenderness Ext: no edema, well perfused with 2+ pulses, Skin: no rash or lesions noted Lymph: no LAD  Data: Lab Results  Component Value Date   WBC 11.7 (H) 05/01/2019   HGB 8.6 (L) 05/01/2019   HCT 28.9 (L) 05/01/2019   MCV 93.8 05/01/2019   PLT 190 05/01/2019   Recent Labs  Lab 04/30/19 2010 05/01/19 0641 05/01/19 1411  HGB 7.8* 9.7* 8.6*   Lab Results  Component Value Date   NA 147 (H) 05/01/2019   K 3.1 (L) 05/01/2019   CL 114 (H) 05/01/2019   CO2 23 05/01/2019   BUN 71 (H) 05/01/2019   CREATININE 1.31 (H) 05/01/2019   Lab Results  Component Value Date   ALT 13 04/29/2019   AST 31 04/29/2019   ALKPHOS 70 04/29/2019   BILITOT 0.5 04/29/2019   No results for input(s): APTT, INR, PTT in the last 168 hours.  Assessment  1.  Metastatic prostate cancer- being seen by palliative care and oncology. 2.  Anemia secondary to gastrointestinal blood loss-Hemoccult positive with anemia and markedly decreased hemoglobin over the past 2 months 3.  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  Partial CODE STATUS with DO NOT INTUBATE  Plan: 1. EGD performed today with no signs of active or occult upper GI bleeding. Colonoscopy not performed due to inadequate prep 2. Will plan for colonoscopy tomorrow. Bowel prep process will be difficult due to patient's clinical status. 3. Clear liquid diet. NPO after midnight 4. Continue to monitor H&H. Transfuse for Hgb <7.0. 5. Continue care per hospital team 6. Further recommendations after procedure   Please call with questions or concerns.    Octavia Bruckner, PA-C [facility] [phone] (Cell)

## 2019-05-01 NOTE — Anesthesia Post-op Follow-up Note (Signed)
Anesthesia QCDR form completed.        

## 2019-05-01 NOTE — Plan of Care (Signed)
  Problem: Clinical Measurements: Goal: Will remain free from infection Outcome: Progressing S/Sx of infection monitored q-shift/ PRN, along with VS.  Pt t-max at 37.4 celsius post blood transfusion. Will continue to monitor and assess.  Problem: Clinical Measurements: Goal: Respiratory complications will improve Outcome: Progressing  Respiratory status monitored and assessed q-shift/ PRN.  Pt is on RA with O2 saturations at 98-100% and respiration rate of 18 breaths per minute.  Pt does not endorsed DOE or SOB.  Will continue to monitor and assess.   Problem: Elimination: Goal: Will not experience complications related to bowel motility Outcome: Progressing Pt's last BM was 04/30/19.   Problem: Elimination: Goal: Will not experience complications related to urinary retention Outcome: Progressing Pt has a condom catheter to contain his urine.  He does display s/sx of urinary retention or abdominal pain.  Will continue to monitor and assess.  Problem: Pain Managment: Goal: General experience of comfort will improve Outcome: Progressing Pt is unable to describing and endorse his pain.  PAINAD scale utilize to evaluate pt's pain.  Problem: Safety: Goal: Ability to remain free from injury will improve Outcome: Progressing Instructed pt and family to utilize RN call light for assistance.  Pt's wife at the bedside at all times.  Hourly rounds performed.  Bed alarm implemented to keep pt safe from falls.  Settings set to third most sensitive setting.  Bed in lowest position, locked with two upper side rails engaged.  Belongings and cal light within reach.   Problem: Skin Integrity: Goal: Risk for impaired skin integrity will decrease Outcome: Progressing Skin integrity monitored and assessed q-shift/ PRN.  Pt is on q2 hourly turns to prevent further skin impairment.  Tubes and drains assessed for device related pressures sores.  Pt has a condom catheter to retain his urine.  He is  incontinent of his bowels.  He is checked frequently and perineal care given promptly after each episode.  Dressing changes performed per MD's orders.  Will continue to monitor and assess.   Problem: Education: Goal: Knowledge of General Education information will improve Description: Including pain rating scale, medication(s)/side effects and non-pharmacologic comfort measures Outcome: Not Progressing Pt is A/O to self.  He does not understand his POC.  Wife at the bedside and understands POC.  Problem: Clinical Measurements: Goal: Diagnostic test results will improve Outcome: Not Progressing Pt remains anemic after 2 units of PRBC's prior shift.  1 unit transfused this shift.  Awaiting AM labs to further assess.

## 2019-05-01 NOTE — Assessment & Plan Note (Addendum)
#  78 year old male patient with metastatic prostate cancer castrate resistant currently on Gillermina Phy is currently admitted to hospital for severe anemia/multiple falls; progressive weight loss/general decline.  # Severe anemia-Nadir hemoglobin 6; question lower GI bleed. EGD/colonoscopy negative; hemoglobin 7.9-stable since admission; but overall not improving.  The etiology is unclear-1 of the possibilities would include bone marrow infiltration by prostate cancer.  This is typically diagnosed based on bone marrow biopsy-this was discussed with the patient's wife/and son [place].  However given patient's poor prognosis/and the fact that this would not change management-I think is reasonable to hold off a bone marrow biopsy at this time.  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  Will discontinue Xtandi.  Patient poor candidate for systemic therapy.  Hospice discussed see below  # Chronic pain-second malignancy stable  Prognosis: I spoke to patient's son [phone]; and also to his Aram [phone]; updated of my clinical concern for progression/declining performance status; my recommendation of hospice.  Discussed that in general hospice is recommended when life expectancy is thought to be less than 6 months.  Given the patient's performance status/progressive malignancy I would suspect 3 to 6 months of life expectancy.    Patrick Jupiter is interested in hospice at patient's own home/not interested in nursing home placement.  He and his wife, Nira Conn will be looking into options for support for the patient/wife.  Discussed with [employer]. Discussed the above plan with the patient's wife she is in agreement.  She is also interested in patient's follow-up at the cancer center.  We will have her follow-up planned in about 2 to 3 weeks.

## 2019-05-01 NOTE — Anesthesia Postprocedure Evaluation (Signed)
Anesthesia Post Note  Patient: [name]  Procedure(s) Performed: ESOPHAGOGASTRODUODENOSCOPY (EGD) (N/A ) COLONOSCOPY (N/A )  Patient location during evaluation: PACU Anesthesia Type: General Level of consciousness: awake and alert Pain management: pain level controlled Vital Signs Assessment: post-procedure vital signs reviewed and stable Respiratory status: spontaneous breathing, nonlabored ventilation, respiratory function stable and patient connected to nasal cannula oxygen Cardiovascular status: blood pressure returned to baseline and stable Postop Assessment: no apparent nausea or vomiting Anesthetic complications: no     Last Vitals:  Vitals:   05/01/19 0829 05/01/19 0941  BP: (!) 97/53 (!) 124/110  Pulse: 88 90  Resp: (!) 22 16  Temp:  37.4 C  SpO2: 99% 96%    Last Pain:  Vitals:   05/01/19 0941  TempSrc: Oral  PainSc:                  Molli Barrows

## 2019-05-01 NOTE — Consult Note (Signed)
[place] CONSULT NOTE  Patient Care Team: Tamsen Roers, MD as PCP - General (Family Medicine) Bernardo Heater, Ronda Fairly, MD as Consulting Physician (Urology)  CHIEF COMPLAINTS/PURPOSE OF CONSULTATION: Prostate cancer  HISTORY OF PRESENTING ILLNESS:  [name] 78 y.o.  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  Patient has been on Xtandi/Lupron for history of prostate cancer.  Patient's PET scan February 2020 showed no significant progressive disease.  PSA between 5-6.  However there has been gradual significant decline in his overall health.  Has been losing weight.  Poor appetite.  Chronic nausea.  Complaining of worsening pain in his hips.  Patient has been falling.As per the wife patient had been having bloody stools in the last few weeks.  On evaluation the hospital noted to have a hemoglobin of 6.  Patient received total 3 units of PRBC.  Hemoglobin on 8.6 this morning. CT scan of the brain negative for any acute process.  Patient had EGD that was unrevealing.  Patient is awaiting repeat colonoscopy after prep tomorrow.  Overall continues to feel poorly.  As per wife patient also having episodes of confusion.   Review of Systems  Constitutional: Positive for malaise/fatigue and weight loss. Negative for chills, diaphoresis and fever.  HENT: Negative for nosebleeds and sore throat.   Eyes: Negative for double vision.  Respiratory: Negative for cough, hemoptysis, sputum production, shortness of breath and wheezing.   Cardiovascular: Negative for chest pain, palpitations, orthopnea and leg swelling.  Gastrointestinal: Positive for blood in stool and nausea. Negative for abdominal pain, constipation, diarrhea, heartburn, melena and vomiting.  Genitourinary: Negative for dysuria, frequency and urgency.  Musculoskeletal: Positive for back pain, joint pain and  myalgias.  Skin: Negative.  Negative for itching and rash.  Neurological: Positive for dizziness and weakness. Negative for tingling, focal weakness and headaches.       Multiple falls; episodes of confusion  Endo/Heme/Allergies: Does not bruise/bleed easily.  Psychiatric/Behavioral: Positive for memory loss. Negative for depression. The patient is not nervous/anxious and does not have insomnia.      MEDICAL HISTORY:  Past Medical History:  Diagnosis Date  . Aortic stenosis, moderate 01/30/2014  . Appetite loss   . Arthritis   . Atrial flutter ([place]) 02/02/2014  . Back pain   . Diabetes mellitus without complication (HCC)    diet control  . GERD (gastroesophageal reflux disease)   . Gout   . Heart murmur   . Heart murmur   . History of kidney stones   . HTN (hypertension) 01/30/2014  . Hypercholesteremia   . Hypertension   . Prostate cancer ([place])    metastatic  . Weakness of left leg     SURGICAL HISTORY: Past Surgical History:  Procedure Laterality Date  . 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 CATARACT EXTRACTION W/ INTRAOCULAR LENS IMPLANT Bilateral   . COLON RESECTION    . COLONOSCOPY N/A 05/01/2019   Procedure: COLONOSCOPY;  Surgeon: Toledo, Benay Pike, MD;  Location: [facility];  Service: Gastroenterology;  Laterality: N/A;  . CYSTOSCOPY W/ RETROGRADES Left 06/08/2015   Procedure: CYSTOSCOPY WITH RETROGRADE PYELOGRAM;  Surgeon: Nickie Retort, MD;  Location: [facility];  Service: Urology;  Laterality: Left;  . CYSTOSCOPY W/ RETROGRADES Left 05/13/2018   Procedure: CYSTOSCOPY WITH  RETROGRADE PYELOGRAM;  Surgeon: Abbie Sons, MD;  Location: [facility];  Service: Urology;  Laterality: Left;  . CYSTOSCOPY W/ RETROGRADES Left 02/03/2019   Procedure: CYSTOSCOPY WITH RETROGRADE PYELOGRAM;  Surgeon: Abbie Sons, MD;  Location: [facility];  Service:  Urology;  Laterality: Left;  . CYSTOSCOPY W/ URETERAL STENT PLACEMENT Left 10/05/2015   Procedure: CYSTOSCOPY WITH STENT REPLACEMENT;  Surgeon: Nickie Retort, MD;  Location: [facility];  Service: Urology;  Laterality: Left;  . CYSTOSCOPY W/ URETERAL STENT PLACEMENT Left 01/04/2016   Procedure: CYSTOSCOPY WITH STENT REPLACEMENT;  Surgeon: Nickie Retort, MD;  Location: [facility];  Service: Urology;  Laterality: Left;  . CYSTOSCOPY W/ URETERAL STENT PLACEMENT Left 04/11/2016   Procedure: CYSTOSCOPY WITH STENT REPLACEMENT;  Surgeon: Nickie Retort, MD;  Location: [facility];  Service: Urology;  Laterality: Left;  . CYSTOSCOPY W/ URETERAL STENT PLACEMENT Left 07/18/2016   Procedure: CYSTOSCOPY WITH STENT REPLACEMENT;  Surgeon: Nickie Retort, MD;  Location: [facility];  Service: Urology;  Laterality: Left;  . CYSTOSCOPY W/ URETERAL STENT PLACEMENT Left 11/02/2016   Procedure: CYSTOSCOPY WITH STENT REPLACEMENT;  Surgeon: Nickie Retort, MD;  Location: [facility];  Service: Urology;  Laterality: Left;  . CYSTOSCOPY W/ URETERAL STENT PLACEMENT Left 02/08/2017   Procedure: CYSTOSCOPY WITH STENT REPLACEMENT;  Surgeon: Nickie Retort, MD;  Location: [facility];  Service: Urology;  Laterality: Left;  . CYSTOSCOPY W/ URETERAL STENT PLACEMENT Left 05/10/2017   Procedure: CYSTOSCOPY WITH STENT REPLACEMENT;  Surgeon: Nickie Retort, MD;  Location: [facility];  Service: Urology;  Laterality: Left;  . CYSTOSCOPY W/ URETERAL STENT PLACEMENT Left 05/13/2018   Procedure: CYSTOSCOPY WITH STENT Exchange;  Surgeon: Abbie Sons, MD;  Location: [facility];  Service: Urology;  Laterality: Left;  . CYSTOSCOPY W/ URETERAL STENT PLACEMENT Left 02/03/2019   Procedure: CYSTOSCOPY WITH STENT REPLACEMENT;  Surgeon: Abbie Sons, MD;  Location: [facility];  Service: Urology;  Laterality: Left;  . CYSTOSCOPY WITH STENT PLACEMENT Left 08/16/2017   Procedure: CYSTOSCOPY WITH STENT EXCHANGE;  Surgeon: Abbie Sons,  MD;  Location: [facility];  Service: Urology;  Laterality: Left;  . CYSTOSCOPY WITH STENT PLACEMENT Left 12/17/2017   Procedure: CYSTOSCOPY WITH STENT EXCHANGE;  Surgeon: Abbie Sons, MD;  Location: [facility];  Service: Urology;  Laterality: Left;  . ESOPHAGOGASTRODUODENOSCOPY N/A 05/01/2019   Procedure: ESOPHAGOGASTRODUODENOSCOPY (EGD);  Surgeon: Toledo, Benay Pike, MD;  Location: [facility];  Service: Gastroenterology;  Laterality: N/A;  . EYE SURGERY Bilateral Sept and Oct.2012   cataract  . HERNIA REPAIR    . PROSTATECTOMY  1994    SOCIAL HISTORY: Social History   Socioeconomic History  . Marital status: Married    Spouse name: Not on file  . Number of children: Not on file  . Years of education: Not on file  . Highest education level: Not on file  Occupational History  . Not on file  Social Needs  . Financial resource strain: Not on file  . Food insecurity    Worry: Not on file    Inability: Not on file  . Transportation needs    Medical: Not on file    Non-medical: Not on file  Tobacco Use  . Smoking status: Former Smoker    Packs/day: 1.50    Years: 25.00    Pack years: 37.50    Types: Cigarettes    Quit date: 01/21/1974    Years since quitting: 45.3  . Smokeless tobacco: Never Used  Substance and Sexual Activity  . Alcohol use: No  . Drug use: No  . Sexual activity: Not Currently  Lifestyle  . Physical activity    Days per week: Not on file    Minutes per session: Not on file  . Stress: Not on file  Relationships  . Social Herbalist on phone: Not on file    Gets together: Not on file    Attends religious service: Not on file    Active member of club or organization: Not on file    Attends meetings of clubs or organizations: Not on file    Relationship status: Not on file  . Intimate partner violence    Fear of current or ex partner: Not on file    Emotionally abused: Not on file    Physically abused: Not on file    Forced sexual  activity: Not on file  Other Topics Concern  . Not on file  Social History Narrative  . Not on file    FAMILY HISTORY: Family History  Problem Relation Age of Onset  . Aneurysm Father   . Cancer Mother        breast    ALLERGIES:  has No Known Allergies.  MEDICATIONS:  Current Facility-Administered Medications  Medication Dose Route Frequency Provider Last Rate Last Dose  . 0.45 % sodium chloride infusion   Intravenous Continuous [name], MD   Stopped at 05/01/19 1339  . acetaminophen (TYLENOL) tablet 650 mg  650 mg Oral Q6H PRN Hillary Bow, MD   650 mg at 04/30/19 2154   Or  . acetaminophen (TYLENOL) suppository 650 mg  650 mg Rectal Q6H PRN Sudini, Alveta Heimlich, MD      . allopurinol (ZYLOPRIM) tablet 300 mg  300 mg Oral Daily Hillary Bow, MD   300 mg at 05/01/19 1006  . atorvastatin (LIPITOR) tablet 10 mg  10 mg Oral Q supper Hillary Bow, MD   10 mg at 04/30/19 1738  . carvedilol (COREG) tablet 6.25 mg  6.25 mg Oral BID WC [name], MD   6.25 mg at 05/01/19 0800  . feeding supplement (ENSURE ENLIVE) (ENSURE ENLIVE) liquid 237 mL  237 mL Oral TID BM [name], MD   237 mL at 05/01/19 1006  . fentaNYL (DURAGESIC) 12 MCG/HR 1 patch  1 patch Transdermal Q72H [name], MD      . insulin aspart (novoLOG) injection 0-5 Units  0-5 Units Subcutaneous QHS Sudini, Srikar, MD      . insulin aspart (novoLOG) injection 0-9 Units  0-9 Units Subcutaneous TID WC Hillary Bow, MD   2 Units at 05/01/19 1203  . pantoprazole (PROTONIX) EC tablet 40 mg  40 mg Oral QAC breakfast Hillary Bow, MD   40 mg at 04/30/19 0904  . polyethylene glycol (MIRALAX / GLYCOLAX) packet 17 g  17 g Oral Daily PRN Sudini, Srikar, MD      . polyethylene glycol powder (GLYCOLAX/MIRALAX) container 255 g  1 Container Oral Once Croley, Granville M, PA-C      . vitamin C (ASCORBIC ACID) tablet 500 mg  500 mg Oral BID [name], MD   500 mg at 04/30/19 2013  . ziprasidone  (GEODON) injection 10 mg  10 mg Intramuscular Q6H PRN Mansy, Jan A, MD   10 mg at 04/30/19 [id number]      .  PHYSICAL EXAMINATION:  Vitals:   05/01/19 0941 05/01/19 1223  BP: (!) 124/110 92/63  Pulse: 90 89  Resp: 16   Temp: 99.3 F (37.4 C) 98.6 F (37 C)  SpO2: 96% 100%   Filed Weights   04/29/19 1416 04/29/19 2134  Weight: 154 lb 5.2 oz (70 kg) 136 lb 11.2 oz (62 kg)    Physical Exam  Constitutional:  Cachectic appearing male patient accompanied by his wife.  Appears pale.  HENT:  Head: Normocephalic and atraumatic.  Mouth/Throat: Oropharynx is clear and moist. No oropharyngeal exudate.  Eyes: Pupils are equal, round, and reactive to light.  Neck: Normal range of motion. Neck supple.  Cardiovascular: Normal rate and regular rhythm.  Pulmonary/Chest: No respiratory distress. He has no wheezes.  Abdominal: Soft. Bowel sounds are normal. He exhibits no distension and no mass. There is no abdominal tenderness. There is no rebound and no guarding.  Musculoskeletal: Normal range of motion.        General: No tenderness or edema.  Neurological:  Sleepy but arousable.  Oriented x2-3  Skin: Skin is warm.  Multiple bruises noted chronic  Psychiatric: Affect normal.     LABORATORY DATA:  I have reviewed the data as listed Lab Results  Component Value Date   WBC 11.7 (H) 05/01/2019   HGB 8.6 (L) 05/01/2019   HCT 28.9 (L) 05/01/2019   MCV 93.8 05/01/2019   PLT 190 05/01/2019   Recent Labs    03/04/19 1350 04/01/19 1259 04/29/19 1422 04/30/19 0516 05/01/19 0641  NA 135 137 143 146* 147*  K 3.6 3.6 3.4* 3.5 3.1*  CL 99 99 108 112* 114*  CO2 26 26 23 22 23   GLUCOSE 173* 176* 146* 122* 173*  BUN 37* 35* 88* 91* 71*  CREATININE 1.26* 1.46* 1.72* 1.47* 1.31*  CALCIUM 9.5 9.8 10.0 9.7 9.3  GFRNONAA 55* 46* 38* 45* 52*  GFRAA >60 53* 43* 53* >60  PROT 7.2 7.1 6.0*  --   --   ALBUMIN 3.5 3.5 3.1*  --   --   AST 21 21 31   --   --   ALT 10 11 13   --   --   ALKPHOS 100  93 70  --   --   BILITOT 0.6 0.6 0.5  --   --     RADIOGRAPHIC STUDIES: I have personally reviewed the radiological images as listed and agreed with the findings in the report. Ct Head Wo Contrast  Result Date: 04/29/2019 CLINICAL DATA:  Head trauma with headache. EXAM: CT HEAD WITHOUT CONTRAST TECHNIQUE: Contiguous axial images were obtained from the base of the skull through the vertex without intravenous contrast. COMPARISON:  None. FINDINGS: Brain: No evidence of acute infarction, hemorrhage, hydrocephalus, extra-axial collection or mass lesion/mass effect. Prominent brain parenchymal volume loss. Moderate deep white matter microangiopathy. Vascular: Calcific atherosclerotic disease. Skull: Normal. Negative for fracture or focal lesion. Sinuses/Orbits: No acute finding. Other: None. IMPRESSION: 1. No acute intracranial abnormality. 2. Atrophy, chronic microvascular disease. Electronically Signed   By: Fidela Salisbury M.D.   On: 04/29/2019 15:48    Prostate cancer metastatic to bone [facility]) # 78 year old male patient with metastatic prostate cancer castrate resistant currently on Xtandi is currently admitted to hospital for severe anemia/multiple falls; progressive weight loss/general decline.  # Severe anemia-[place] hemoglobin 6; question lower GI bleed. EGD negative; awaiting colonoscopy tomorrow.  No obvious evidence of chronic iron deficiency.   # Metastatic castrate resistant prostate cancer-currently on Xtandi/Lupron.  Most recent PET scan-February 2020-negative for any significant progressive disease; chronic bone lesions noted.  PSA between 5-6.  Hold  Xtandi for now.  # General significant decline performance status/weight loss/mental status changes-unclear etiology.  PET scan in February 2020 did not show obvious progression of disease.  Recommend MRI of the brain with contrast with multiple falls.   # Chronic pain-second malignancy stable  Prognosis: Had a long discussion with the  patient and his wife regarding overall difficult situation where there has been significant decline in his performance status/weight loss in the last few months.  It is unclear if this is related to his underlying prostate malignancy versus others. Patient's wife seems to be " hopeful" of recovery, reluctant with hospice approach at this time. Await above work-up-brain MRI/colonoscopy for further evaluation.  If no obvious etiology noted-patient could be discharged if medically stable/with palliative care follow-up as outpatient.   Thank you Dr. Tressia Miners for allowing me to participate in the care of your pleasant patient. Please do not hesitate to contact me with questions or concerns in the interim.   All questions were answered. The patient knows to call the clinic with any problems, questions or concerns.    Cammie Sickle, MD 05/01/2019 5:35 PM

## 2019-05-01 NOTE — Op Note (Addendum)
[facility] Gastroenterology Patient Name: [name] Procedure Date: 05/01/2019 7:41 AM MRN: [id number] Account #: [id number] Date of Birth: 01-28-1941 Admit Type: Inpatient Age: 78 Room: [facility] ENDO ROOM 1 Gender: Male Note Status: Finalized Procedure:            Upper GI endoscopy Indications:          Iron deficiency anemia secondary to chronic blood loss,                        Heme positive stool Providers:            Benay Pike. Alice Reichert MD, MD Referring MD:         Tamsen Roers MD (Referring MD) Medicines:            Propofol per Anesthesia Complications:        No immediate complications. Procedure:            Pre-Anesthesia Assessment:                       - The risks and benefits of the procedure and the                        sedation options and risks were discussed with the                        patient. All questions were answered and informed                        consent was obtained.                       - Patient identification and proposed procedure were                        verified prior to the procedure by the nurse. The                        procedure was verified in the procedure room.                       - ASA Grade Assessment: IV - A patient with severe                        systemic disease that is a constant threat to life.                       - After reviewing the risks and benefits, the patient                        was deemed in satisfactory condition to undergo the                        procedure.                       After obtaining informed consent, the endoscope was                        passed under direct vision. Throughout the procedure,  the patient's blood pressure, pulse, and oxygen                        saturations were monitored continuously. The Endoscope                        was introduced through the mouth, and advanced to the                        third part of duodenum. The  upper GI endoscopy was                        accomplished without difficulty. The patient tolerated                        the procedure well. Findings:      The examined esophagus was normal.      Localized mild inflammation characterized by erythema was found in the       gastric antrum.      The cardia and gastric fundus were normal on retroflexion.      A 7 mm non-bleeding diverticulum was found at the pylorus.      The examined duodenum was normal. Impression:           - Normal esophagus.                       - Gastritis.                       - Gastric diverticulum.                       - Normal examined duodenum.                       - No specimens collected. 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 All rights reserved. The codes documented  in this report are preliminary and upon coder review may  be revised to meet current compliance requirements. Efrain Sella MD, MD 05/01/2019 7:56:28 AM This report has been signed electronically. Number of Addenda: 0 Note Initiated On: 05/01/2019 7:41 AM Estimated Blood Loss: Estimated blood loss: none.      [facility]

## 2019-05-01 NOTE — Interval H&P Note (Signed)
History and Physical Interval Note:  05/01/2019 7:41 AM  [name]  has presented today for surgery, with the diagnosis of Anemia secondary to gastrointestinal blood loss, hemoccult positive stool.  The various methods of treatment have been discussed with the patient and family. After consideration of risks, benefits and other options for treatment, the patient has consented to  Procedure(s): ESOPHAGOGASTRODUODENOSCOPY (EGD) (N/A) COLONOSCOPY (N/A) as a surgical intervention.  The patient's history has been reviewed, patient examined, no change in status, stable for surgery.  I have reviewed the patient's chart and labs.  Questions were answered to the patient's satisfaction.     [place], [place]

## 2019-05-01 NOTE — Transfer of Care (Signed)
Immediate Anesthesia Transfer of Care Note  Patient: [name]  Procedure(s) Performed: Procedure(s): ESOPHAGOGASTRODUODENOSCOPY (EGD) (N/A) COLONOSCOPY (N/A)  Patient Location: PACU and Endoscopy Unit  Anesthesia Type:General  Level of Consciousness: sedated  Airway & Oxygen Therapy: Patient Spontanous Breathing and Patient connected to nasal cannula oxygen  Post-op Assessment: Report given to RN and Post -op Vital signs reviewed and stable  Post vital signs: Reviewed and stable  Last Vitals:  Vitals:   05/01/19 0703 05/01/19 0807  BP: (!) 105/58 96/60  Pulse: 83 90  Resp: 18 18  Temp: (!) 38.2 C   SpO2: [id number]    Complications: No apparent anesthesia complications

## 2019-05-01 NOTE — Anesthesia Procedure Notes (Signed)
Date/Time: 05/01/2019 7:50 AM Performed by: Doreen Salvage, CRNA Pre-anesthesia Checklist: Patient identified, Emergency Drugs available, Suction available and Patient being monitored Patient Re-evaluated:Patient Re-evaluated prior to induction Oxygen Delivery Method: Nasal cannula Induction Type: IV induction Dental Injury: Teeth and Oropharynx as per pre-operative assessment  Comments: Nasal cannula with etCO2 monitoring

## 2019-05-01 NOTE — Progress Notes (Signed)
GI service note  EGD completed. See note. Overall no sources of significant blood loss were identified.  Colonoscopy cancelled due to patient not drinking bowel prep and digital rectal examination revealing large amount of solid stool in the rectal vault.  Further discussion with family needs to  occur regarding wishes.and feasibility and risk of proceeding with rescheduling colonoscopy.   [name], M.D. ABIM Diplomate in Gastroenterology [place]

## 2019-05-02 ENCOUNTER — Inpatient Hospital Stay: Payer: Medicare HMO | Admitting: Anesthesiology

## 2019-05-02 ENCOUNTER — Encounter: Payer: Self-pay | Admitting: *Deleted

## 2019-05-02 ENCOUNTER — Encounter
Admission: EM | Disposition: A | Discharge status: Hospice | Payer: Self-pay | Source: Home / Self Care | Attending: Internal Medicine

## 2019-05-02 ENCOUNTER — Inpatient Hospital Stay: Payer: Medicare HMO

## 2019-05-02 HISTORY — PX: COLONOSCOPY: SHX5424

## 2019-05-02 LAB — TYPE AND SCREEN
ABO/RH(D): A POS
Antibody Screen: NEGATIVE
Unit division: 0
Unit division: 0
Unit division: 0
Unit division: 0

## 2019-05-02 LAB — CBC
HCT: 24.5 % — ABNORMAL LOW (ref 39.0–52.0)
Hemoglobin: 8.3 g/dL — ABNORMAL LOW (ref 13.0–17.0)
MCH: 32 pg (ref 26.0–34.0)
MCHC: 33.9 g/dL (ref 30.0–36.0)
MCV: 94.6 fL (ref 80.0–100.0)
Platelets: 193 10*3/uL (ref 150–400)
RBC: 2.59 MIL/uL — ABNORMAL LOW (ref 4.22–5.81)
RDW: 18.3 % — ABNORMAL HIGH (ref 11.5–15.5)
WBC: 10.9 10*3/uL — ABNORMAL HIGH (ref 4.0–10.5)
nRBC: 0.2 % (ref 0.0–0.2)

## 2019-05-02 LAB — BPAM RBC
Blood Product Expiration Date: 202009302359
Blood Product Expiration Date: 202010092359
Blood Product Expiration Date: 202010142359
Blood Product Expiration Date: 202010142359
ISSUE DATE / TIME: 202009091709
ISSUE DATE / TIME: 202009101419
ISSUE DATE / TIME: 202009110114
Unit Type and Rh: 6200
Unit Type and Rh: 6200
Unit Type and Rh: 6200
Unit Type and Rh: 6200

## 2019-05-02 LAB — MAGNESIUM: Magnesium: 1.7 mg/dL (ref 1.7–2.4)

## 2019-05-02 LAB — BASIC METABOLIC PANEL
Anion gap: 9 (ref 5–15)
BUN: 48 mg/dL — ABNORMAL HIGH (ref 8–23)
CO2: 23 mmol/L (ref 22–32)
Calcium: 8.8 mg/dL — ABNORMAL LOW (ref 8.9–10.3)
Chloride: 114 mmol/L — ABNORMAL HIGH (ref 98–111)
Creatinine, Ser: 1.24 mg/dL (ref 0.61–1.24)
GFR calc Af Amer: >60 mL/min (ref >60)
GFR calc non Af Amer: 56 mL/min — ABNORMAL LOW (ref >60)
Glucose, Bld: 158 mg/dL — ABNORMAL HIGH (ref 70–99)
Potassium: 2.9 mmol/L — ABNORMAL LOW (ref 3.5–5.1)
Sodium: 146 mmol/L — ABNORMAL HIGH (ref 135–145)

## 2019-05-02 LAB — GLUCOSE, CAPILLARY
Glucose-Capillary: 146 mg/dL — ABNORMAL HIGH (ref 70–99)
Glucose-Capillary: 137 mg/dL — ABNORMAL HIGH (ref 70–99)
Glucose-Capillary: 156 mg/dL — ABNORMAL HIGH (ref 70–99)
Glucose-Capillary: 119 mg/dL — ABNORMAL HIGH (ref 70–99)

## 2019-05-02 LAB — PREPARE RBC (CROSSMATCH)

## 2019-05-02 LAB — TECHNOLOGIST SMEAR REVIEW: Plt Morphology: NONE SEEN

## 2019-05-02 SURGERY — COLONOSCOPY
Anesthesia: General

## 2019-05-02 MED ORDER — GADOBUTROL 1 MMOL/ML IV SOLN
6.0000 mL | Freq: Once | INTRAVENOUS | Status: AC | PRN
  Administered 2019-05-02: 6 mL via INTRAVENOUS

## 2019-05-02 MED ORDER — PROPOFOL 10 MG/ML IV BOLUS
INTRAVENOUS | Status: AC
  Filled 2019-05-02: qty 20

## 2019-05-02 MED ORDER — SODIUM CHLORIDE 0.9 % IV SOLN
INTRAVENOUS | Status: DC
  Administered 2019-05-02: 10000 mL via INTRAVENOUS

## 2019-05-02 MED ORDER — PEG 3350-KCL-NA BICARB-NACL 420 G PO SOLR
2000.0000 mL | Freq: Once | ORAL | Status: AC
  Administered 2019-05-02: 2000 mL via ORAL
  Filled 2019-05-02: qty 4000

## 2019-05-02 MED ORDER — POTASSIUM CHLORIDE CRYS ER 20 MEQ PO TBCR
40.0000 meq | EXTENDED_RELEASE_TABLET | Freq: Two times a day (BID) | ORAL | Status: AC
  Administered 2019-05-02 (×2): 40 meq via ORAL
  Filled 2019-05-02 (×2): qty 2

## 2019-05-02 MED ORDER — PROPOFOL 10 MG/ML IV BOLUS
INTRAVENOUS | Status: DC | PRN
  Administered 2019-05-02: 30 mg via INTRAVENOUS

## 2019-05-02 MED ORDER — POTASSIUM CHLORIDE IN NACL 20-0.45 MEQ/L-% IV SOLN
INTRAVENOUS | Status: DC
  Administered 2019-05-02 – 2019-05-05 (×6): via INTRAVENOUS
  Filled 2019-05-02 (×13): qty 1000

## 2019-05-02 NOTE — Progress Notes (Signed)
PT Cancellation Note  Patient Details Name: [name] MRN: [id number] DOB: April 07, 1941   Cancelled Treatment:    Reason Eval/Treat Not Completed: Other (comment)(Patient undergoing bowel prep for procedure. Per nursing will be best to attempt at later time. Will return/attempt again at later time/date.)  Janna Arch, PT, DPT    05/02/2019, 9:50 AM

## 2019-05-02 NOTE — Progress Notes (Signed)
The patient was brought down to endoscopy and placed on his left side.  The patient was given a small dose of propofol to help turn him since he was in considerable discomfort trying to turn himself. Upon turning the patient to the left side a large amount of black thick stool was seen all around the bed the rectum and the procedure was canceled.  Upon cleaning him up the patient had large amounts of dark solid stool coming out.

## 2019-05-02 NOTE — Progress Notes (Signed)
PT Cancellation Note  Patient Details Name: [name] MRN: [id number] DOB: 23-Jul-1941   Cancelled Treatment:    Reason Eval/Treat Not Completed: Patient at procedure or test/unavailable(Third attempt at evaluating patient today. Patient now in imaging/MRI and not in room. Will attempt again at later time/date.)  Janna Arch, PT, DPT   05/02/2019, 3:05 PM

## 2019-05-02 NOTE — Anesthesia Post-op Follow-up Note (Signed)
Anesthesia QCDR form completed.        

## 2019-05-02 NOTE — Transfer of Care (Signed)
Immediate Anesthesia Transfer of Care Note  Patient: [name]  Procedure(s) Performed: COLONOSCOPY (N/A )  Patient Location: PACU and Endoscopy Unit  Anesthesia Type:General  Level of Consciousness: sedated  Airway & Oxygen Therapy: Patient Spontanous Breathing and Patient connected to nasal cannula oxygen  Post-op Assessment: Report given to RN and Post -op Vital signs reviewed and stable  Post vital signs: Reviewed and stable  Last Vitals:  Vitals Value Taken Time  BP    Temp    Pulse    Resp    SpO2      Last Pain:  Vitals:   05/02/19 0422  TempSrc: Oral  PainSc:       Patients Stated Pain Goal: 0 ([id number])  Complications: No apparent anesthesia complications

## 2019-05-02 NOTE — Anesthesia Preprocedure Evaluation (Addendum)
Anesthesia Evaluation  Patient identified by MRN, date of birth, ID band Patient awake    Reviewed: Allergy & Precautions, H&P , NPO status , reviewed documented beta blocker date and time   Airway Mallampati: II  TM Distance: >3 FB Neck ROM: full    Dental  (+) Upper Dentures, Lower Dentures   Pulmonary pneumonia, resolved, former smoker,    Pulmonary exam normal        Cardiovascular hypertension, + Past MI  Normal cardiovascular exam+ Valvular Problems/Murmurs   ECHO 12/2015 Left ventricle: The cavity size was mildly dilated. Wall   thickness was normal. Systolic function was normal. The estimated   ejection fraction was in the range of 55% to 60%. Left   ventricular diastolic function parameters were normal for the   patient&'s age. - Aortic valve: Moderately calcified annulus. Moderately calcified   leaflets. There was moderate stenosis. There was moderate   regurgitation. Valve area (VTI): 1.16 cm^2. Valve area (Vmax):   1.16 cm^2. Valve area (Vmean): 1.17 cm^2. - Mitral valve: There was mild regurgitation. - Tricuspid valve: There was moderate regurgitation.   Neuro/Psych    GI/Hepatic GERD  Controlled,  Endo/Other  diabetes  Renal/GU Renal disease     Musculoskeletal  (+) Arthritis ,   Abdominal   Peds  Hematology  (+) Blood dyscrasia, anemia ,   Anesthesia Other Findings Past Medical History: 01/30/2014: Aortic stenosis, moderate No date: Appetite loss No date: Arthritis 02/02/2014: Atrial flutter (HCC) No date: Back pain No date: Diabetes mellitus without complication (HCC)     Comment:  diet control No date: GERD (gastroesophageal reflux disease) No date: Gout No date: Heart murmur No date: Heart murmur No date: History of kidney stones 01/30/2014: HTN (hypertension) No date: Hypercholesteremia No date: Hypertension No date: Prostate cancer ([place])     Comment:  metastatic No date: Weakness of  left leg  Past Surgical History: 08/29/2018: AMPUTATION TOE; Right     Comment:  Procedure: Toe IPJ Right;  Surgeon: Albertine Patricia,               DPM;  Location: [facility];  Service: Podiatry;                Laterality: Right; No date: CATARACT EXTRACTION W/ INTRAOCULAR LENS IMPLANT; Bilateral No date: COLON RESECTION 05/01/2019: COLONOSCOPY; N/A     Comment:  Procedure: COLONOSCOPY;  Surgeon: Toledo, Benay Pike, MD;              Location: [facility];  Service: Gastroenterology;                Laterality: N/A; 06/08/2015: CYSTOSCOPY W/ RETROGRADES; Left     Comment:  Procedure: CYSTOSCOPY WITH RETROGRADE PYELOGRAM;                Surgeon: Nickie Retort, MD;  Location: [facility];                Service: Urology;  Laterality: Left; 05/13/2018: CYSTOSCOPY W/ RETROGRADES; Left     Comment:  Procedure: CYSTOSCOPY WITH RETROGRADE PYELOGRAM;                Surgeon: Abbie Sons, MD;  Location: [facility];                Service: Urology;  Laterality: Left; 02/03/2019: CYSTOSCOPY W/ RETROGRADES; Left     Comment:  Procedure: CYSTOSCOPY WITH RETROGRADE PYELOGRAM;  Surgeon: Abbie Sons, MD;  Location: [facility];                Service: Urology;  Laterality: Left; 10/05/2015: CYSTOSCOPY W/ URETERAL STENT PLACEMENT; Left     Comment:  Procedure: CYSTOSCOPY WITH STENT REPLACEMENT;  Surgeon:               Nickie Retort, MD;  Location: [facility];  Service:               Urology;  Laterality: Left; 01/04/2016: CYSTOSCOPY W/ URETERAL STENT PLACEMENT; Left     Comment:  Procedure: CYSTOSCOPY WITH STENT REPLACEMENT;  Surgeon:               Nickie Retort, MD;  Location: [facility];  Service:               Urology;  Laterality: Left; 04/11/2016: CYSTOSCOPY W/ URETERAL STENT PLACEMENT; Left     Comment:  Procedure: CYSTOSCOPY WITH STENT REPLACEMENT;  Surgeon:               Nickie Retort, MD;  Location: [facility];  Service:               Urology;  Laterality:  Left; 07/18/2016: CYSTOSCOPY W/ URETERAL STENT PLACEMENT; Left     Comment:  Procedure: CYSTOSCOPY WITH STENT REPLACEMENT;  Surgeon:               Nickie Retort, MD;  Location: [facility];  Service:               Urology;  Laterality: Left; 11/02/2016: CYSTOSCOPY W/ URETERAL STENT PLACEMENT; Left     Comment:  Procedure: CYSTOSCOPY WITH STENT REPLACEMENT;  Surgeon:               Nickie Retort, MD;  Location: [facility];  Service:               Urology;  Laterality: Left; 02/08/2017: CYSTOSCOPY W/ URETERAL STENT PLACEMENT; Left     Comment:  Procedure: CYSTOSCOPY WITH STENT REPLACEMENT;  Surgeon:               Nickie Retort, MD;  Location: [facility];  Service:               Urology;  Laterality: Left; 05/10/2017: CYSTOSCOPY W/ URETERAL STENT PLACEMENT; Left     Comment:  Procedure: CYSTOSCOPY WITH STENT REPLACEMENT;  Surgeon:               Nickie Retort, MD;  Location: [facility];  Service:               Urology;  Laterality: Left; 05/13/2018: CYSTOSCOPY W/ URETERAL STENT PLACEMENT; Left     Comment:  Procedure: CYSTOSCOPY WITH STENT Exchange;  Surgeon:               Abbie Sons, MD;  Location: [facility];  Service:               Urology;  Laterality: Left; 02/03/2019: CYSTOSCOPY W/ URETERAL STENT PLACEMENT; Left     Comment:  Procedure: CYSTOSCOPY WITH STENT REPLACEMENT;  Surgeon:               Abbie Sons, MD;  Location: [facility];  Service:               Urology;  Laterality: Left; 08/16/2017: CYSTOSCOPY WITH STENT PLACEMENT; Left     Comment:  Procedure: CYSTOSCOPY WITH STENT EXCHANGE;  Surgeon:               Abbie Sons, MD;  Location: [facility];  Service:               Urology;  Laterality: Left; 12/17/2017: CYSTOSCOPY WITH STENT PLACEMENT; Left     Comment:  Procedure: CYSTOSCOPY WITH STENT EXCHANGE;  Surgeon:               Abbie Sons, MD;  Location: [facility];  Service:               Urology;  Laterality: Left; 05/01/2019: ESOPHAGOGASTRODUODENOSCOPY;  N/A     Comment:  Procedure: ESOPHAGOGASTRODUODENOSCOPY (EGD);  Surgeon:               Toledo, Benay Pike, MD;  Location: [facility];                Service: Gastroenterology;  Laterality: N/A; Sept and Oct.2012: EYE SURGERY; Bilateral     Comment:  cataract No date: HERNIA REPAIR 1994: PROSTATECTOMY  BMI    Body Mass Index: 19.06 kg/m      Reproductive/Obstetrics                            Anesthesia Physical Anesthesia Plan  ASA: III and emergent  Anesthesia Plan: General   Post-op Pain Management:    Induction: Intravenous  PONV Risk Score and Plan: 2 and Treatment may vary due to age or medical condition and TIVA  Airway Management Planned: Nasal Cannula and Natural Airway  Additional Equipment:   Intra-op Plan:   Post-operative Plan:   Informed Consent: I have reviewed the patients History and Physical, chart, labs and discussed the procedure including the risks, benefits and alternatives for the proposed anesthesia with the patient or authorized representative who has indicated his/her understanding and acceptance.     Dental Advisory Given  Plan Discussed with: CRNA  Anesthesia Plan Comments:         Anesthesia Quick Evaluation

## 2019-05-02 NOTE — Progress Notes (Signed)
[facility] [place] NAME: [name]    MR#:  [id number]  DATE OF BIRTH:  11-Jun-1941  SUBJECTIVE:  CHIEF COMPLAINT:   Chief Complaint  Patient presents with  . Weakness   -Received 2 units transfusion since admission.   EGD is normal .    -Patient is alert but not interacting well.  No family in the room.  REVIEW OF SYSTEMS:  Review of Systems  Unable to perform ROS: Mental acuity    DRUG ALLERGIES:  No Known Allergies  VITALS:  Blood pressure (!) 108/52, pulse 77, temperature 98.6 F (37 C), resp. rate 20, height 6' (1.829 m), weight 63.7 kg, SpO2 100 %.  PHYSICAL EXAMINATION:  Physical Exam   GENERAL:  78 y.o.-year-old thin built well-nourished patient lying in the bed and appears to be restless.  EYES: Pupils equal, round, reactive to light and accommodation. No scleral icterus. Extraocular muscles intact.  HEENT: Head atraumatic, normocephalic. Oropharynx and nasopharynx clear.  Dry mucous membranes NECK:  Supple, no jugular venous distention. No thyroid enlargement, no tenderness.  LUNGS: Normal breath sounds bilaterally, no wheezing, rales,rhonchi or crepitation. No use of accessory muscles of respiration.  Decreased bibasilar breath sounds CARDIOVASCULAR: S1, S2 normal. No  rubs, or gallops.  3/6 systolic murmur is present ABDOMEN: Soft, nontender, nondistended. Bowel sounds present. No organomegaly or mass.  EXTREMITIES: No pedal edema, cyanosis, or clubbing.  NEUROLOGIC: Patient speaks 1-2 words ,patient does not have dentures.  following simple commands.  Sensation is intact .  PSYCHIATRIC: The patient is alert but does not interact much-though he follows simple commands and answer with 1 or 2 words SKIN: No obvious rash, lesion, or ulcer.    LABORATORY PANEL:   CBC Recent Labs  Lab 05/02/19 0716  WBC 10.9*  HGB 8.3*  HCT 24.5*  PLT 193    ------------------------------------------------------------------------------------------------------------------  Chemistries  Recent Labs  Lab 04/29/19 1422  05/02/19 0716  NA 143   < > 146*  K 3.4*   < > 2.9*  CL 108   < > 114*  CO2 23   < > 23  GLUCOSE 146*   < > 158*  BUN 88*   < > 48*  CREATININE 1.72*   < > 1.24  CALCIUM 10.0   < > 8.8*  MG 1.9  --  1.7  AST 31  --   --   ALT 13  --   --   ALKPHOS 70  --   --   BILITOT 0.5  --   --    < > = values in this interval not displayed.   ------------------------------------------------------------------------------------------------------------------  Cardiac Enzymes No results for input(s): TROPONINI in the last 168 hours. ------------------------------------------------------------------------------------------------------------------  RADIOLOGY:  No results found.  EKG:   Orders placed or performed during the hospital encounter of 04/29/19  . ED EKG  . 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  1.  Acute on chronic anemia-likely anemia of chronic disease, iron levels within normal limits.  B12 greater than 500 -Hemoglobin 6.0 on admission, baseline is at 10. -Received a total of 2 units packed RBC transfusion since admission.  -Improved after transfusion but again gradually dropping down. -GI  has been consulted.  EGD done, no active source of bleeding identified.   -On full liquid diet today.  Continue Protonix orally. -Today he was taken for colonoscopy, but before procedure he had a large dark bowel movement and procedure was canceled and patient was sent back to the room.  2.  Metastatic prostate cancer-patient has stage IV prostate cancer currently  on Xtandi. -Overall clinically doing very poorly.  Weight loss, generalized weakness and now with acute anemia. -Palliative care consulted.  Oncology to follow in the hospital as well. -Wife was still not ready for hospice as per the discussion and was recommending to continue treating the treatable and the plan was to discharge home with palliative care when stable. -Oncologist had suggested to do MRI on brain to rule out mets due to change in mental status  3.  Chronic pain from metastatic prostate cancer to balance -Restarted his fentanyl patch this morning. If needed will add oxycodone as needed for pain-however already appears sleeping most of the time  4.  Hypertension-on low-dose Coreg  5.  DVT prophylaxis-teds and SCDs only  I tried calling patient's wife on her phone but she did not pick up today.   All the records are reviewed and case discussed with Care Management/Social Workerr. Management plans discussed with the patient, family and they are in agreement.  CODE STATUS: Partial code  TOTAL TIME TAKING CARE OF THIS PATIENT: 35 minutes.   POSSIBLE D/C IN 3-4 DAYS, DEPENDING ON CLINICAL CONDITION.   Vaughan Basta M.D on 05/02/2019 at 3:22 PM  Between 7am to 6pm - Pager - [phone]  After 6pm go to [email or web address] - password [email or web address] [facility]  Office  [phone]  CC: Primary care physician; Tamsen Roers, MD

## 2019-05-02 NOTE — Anesthesia Postprocedure Evaluation (Signed)
Anesthesia Post Note  Patient: [name]  Procedure(s) Performed: COLONOSCOPY (N/A )  Patient location during evaluation: Endoscopy Anesthesia Type: General Level of consciousness: awake and alert Pain management: pain level controlled Vital Signs Assessment: post-procedure vital signs reviewed and stable Respiratory status: spontaneous breathing, nonlabored ventilation and respiratory function stable Cardiovascular status: blood pressure returned to baseline and stable Postop Assessment: no apparent nausea or vomiting Anesthetic complications: no     Last Vitals:  Vitals:   05/02/19 1330 05/02/19 1520  BP: (!) 104/46 (!) 108/52  Pulse: 77   Resp: 20   Temp:    SpO2: 100%     Last Pain:  Vitals:   05/02/19 0730  TempSrc:   PainSc: Asleep                 Alphonsus Sias

## 2019-05-02 NOTE — Progress Notes (Signed)
PT Cancellation Note  Patient Details Name: [name] MRN: [id number] DOB: 27-Feb-1941   Cancelled Treatment:    Reason Eval/Treat Not Completed: Patient at procedure or test/unavailable(Patient not in room upon PT second attempt. Will attempt again at later time/date when patient is available.)  Janna Arch, PT, DPT   05/02/2019, 12:48 PM

## 2019-05-03 LAB — GLUCOSE, CAPILLARY
Glucose-Capillary: 97 mg/dL (ref 70–99)
Glucose-Capillary: 128 mg/dL — ABNORMAL HIGH (ref 70–99)
Glucose-Capillary: 102 mg/dL — ABNORMAL HIGH (ref 70–99)
Glucose-Capillary: 97 mg/dL (ref 70–99)

## 2019-05-03 LAB — CBC
HCT: 24.2 % — ABNORMAL LOW (ref 39.0–52.0)
Hemoglobin: 7.9 g/dL — ABNORMAL LOW (ref 13.0–17.0)
MCH: 32 pg (ref 26.0–34.0)
MCHC: 32.6 g/dL (ref 30.0–36.0)
MCV: 98 fL (ref 80.0–100.0)
Platelets: 178 10*3/uL (ref 150–400)
RBC: 2.47 MIL/uL — ABNORMAL LOW (ref 4.22–5.81)
RDW: 19.3 % — ABNORMAL HIGH (ref 11.5–15.5)
WBC: 8.5 10*3/uL (ref 4.0–10.5)
nRBC: 0 % (ref 0.0–0.2)

## 2019-05-03 LAB — BASIC METABOLIC PANEL
Anion gap: 9 (ref 5–15)
BUN: 33 mg/dL — ABNORMAL HIGH (ref 8–23)
CO2: 23 mmol/L (ref 22–32)
Calcium: 8.3 mg/dL — ABNORMAL LOW (ref 8.9–10.3)
Chloride: 114 mmol/L — ABNORMAL HIGH (ref 98–111)
Creatinine, Ser: 1.07 mg/dL (ref 0.61–1.24)
GFR calc Af Amer: >60 mL/min (ref >60)
GFR calc non Af Amer: >60 mL/min (ref >60)
Glucose, Bld: 113 mg/dL — ABNORMAL HIGH (ref 70–99)
Potassium: 3.5 mmol/L (ref 3.5–5.1)
Sodium: 146 mmol/L — ABNORMAL HIGH (ref 135–145)

## 2019-05-03 MED ORDER — GERHARDT'S BUTT CREAM
TOPICAL_CREAM | CUTANEOUS | Status: DC | PRN
  Administered 2019-05-03: 12:00:00 via TOPICAL
  Filled 2019-05-03: qty 1

## 2019-05-03 MED ORDER — PEG 3350-KCL-NA BICARB-NACL 420 G PO SOLR
2000.0000 mL | Freq: Once | ORAL | Status: AC
  Administered 2019-05-03: 2000 mL via ORAL
  Filled 2019-05-03: qty 4000

## 2019-05-03 MED ORDER — CARVEDILOL 6.25 MG PO TABS
3.1250 mg | ORAL_TABLET | Freq: Two times a day (BID) | ORAL | Status: DC
  Administered 2019-05-05 – 2019-05-07 (×4): 3.125 mg via ORAL
  Filled 2019-05-03 (×6): qty 1

## 2019-05-03 NOTE — Evaluation (Signed)
Physical Therapy Evaluation Patient Details Name: [name] MRN: [id number] DOB: 01-02-1941 Today's Date: 05/03/2019   History of Present Illness  78 yo male with onset of weakness and falls with bruising had suspected GI bleed symptoms, anemic and transfused but with clear EGD.  PMHx:  prostate CA, DM, HTN, a-flutter, heart murmur, GERD, gout, back pain, moderate aortic stenosis74 yo male with onset of weakness and falls with bruising had suspected GI bleed symptoms, anemic and transfused but with clear EGD.  PMHx:  prostate CA, DM, HTN, a-flutter, heart murmur, GERD, gout, back pain, moderate aortic stenosis35 yo male with onset of weakness and falls with bruising had suspected GI bleed symptoms, anemic and transfused but with clear EGD.  PMHx:  prostate CA, DM, HTN, a-flutter, heart murmur, GERD, gout, back pain, moderate aortic stenosis  Clinical Impression  Pt is having difficulty with movement esp to control use of LE's to support both standing and balance for gait.  His plan is to ask for SNF placement unless he can demonstrate ability to handle gait and stairs such that his wife can assist and he can be alone part of the day.  Follow acutely for progression of all therapy goals, and will work to decrease the time he will need to be in SNF for rehab.    Follow Up Recommendations SNF    Equipment Recommendations  None recommended by PT    Recommendations for Other Services       Precautions / Restrictions Precautions Precautions: Fall Precaution Comments: monitor for safety w/impulsivity Restrictions Weight Bearing Restrictions: No      Mobility  Bed Mobility Overal bed mobility: Needs Assistance Bed Mobility: Supine to Sit     Supine to sit: Min assist     General bed mobility comments: min to support trunk to sit up  Transfers Overall transfer level: Needs assistance Equipment used: Rolling walker (2 wheeled);1 person hand held assist Transfers: Sit to/from  [facility] Sit to Stand: Min assist Stand pivot transfers: Min assist       General transfer comment: min assist but shuffling and unsteady  Ambulation/Gait         Gait velocity: reduced, unsteady Gait velocity interpretation: <1.8 ft/sec, indicate of risk for recurrent falls General Gait Details: steps were related to transfers only  Stairs            Wheelchair Mobility    Modified Rankin (Stroke Patients Only)       Balance Overall balance assessment: Needs assistance Sitting-balance support: Feet supported Sitting balance-Leahy Scale: Fair     Standing balance support: Bilateral upper extremity supported;During functional activity Standing balance-Leahy Scale: Poor                               Pertinent Vitals/Pain Pain Assessment: No/denies pain    Home Living Family/patient expects to be discharged to:: Private residence Living Arrangements: Spouse/significant other Available Help at Discharge: Family;Available PRN/intermittently Type of Home: House Home Access: Stairs to enter   Entrance Stairs-Number of Steps: 4 Home Layout: One level Home Equipment: Cane - single point;Walker - 4 wheels Additional Comments: was able to go out on trip in community and walk recently    Prior Function Level of Independence: Independent with assistive device(s)         Comments: wife is working while pt is home     Hand Dominance   Dominant Hand: Right  Extremity/Trunk Assessment   Upper Extremity Assessment Upper Extremity Assessment: Overall WFL for tasks assessed    Lower Extremity Assessment Lower Extremity Assessment: Generalized weakness    Cervical / Trunk Assessment Cervical / Trunk Assessment: Kyphotic  Communication   Communication: No difficulties  Cognition Arousal/Alertness: Awake/alert Behavior During Therapy: Impulsive Overall Cognitive Status: No family/caregiver present to determine baseline  cognitive functioning                                 General Comments: pt is able to answer history questions but feels he is fine, impulsive regarding bedside sitting      General Comments General comments (skin integrity, edema, etc.): pt is a bit impulsive in standing up and taking steps to the chair, requring continual cues for hand placement for all parts of tranfers    Exercises     Assessment/Plan    PT Assessment Patient needs continued PT services  PT Problem List Decreased strength;Decreased range of motion;Decreased activity tolerance;Decreased balance;Decreased mobility;Decreased coordination;Decreased cognition;Decreased knowledge of use of DME;Decreased safety awareness;Decreased knowledge of precautions;Decreased skin integrity       PT Treatment Interventions DME instruction;Gait training;Stair training;Functional mobility training;Therapeutic activities;Therapeutic exercise;Balance training;Neuromuscular re-education;Patient/family education    PT Goals (Current goals can be found in the Care Plan section)  Acute Rehab PT Goals Patient Stated Goal: none stated PT Goal Formulation: Patient unable to participate in goal setting Time For Goal Achievement: 05/17/19 Potential to Achieve Goals: Fair    Frequency Min 2X/week   Barriers to discharge Inaccessible home environment;Decreased caregiver support home with wife and staired environment    Co-evaluation               AM-PAC PT "6 Clicks" Mobility  Outcome Measure Help needed turning from your back to your side while in a flat bed without using bedrails?: A Lot Help needed moving from lying on your back to sitting on the side of a flat bed without using bedrails?: A Lot Help needed moving to and from a bed to a chair (including a wheelchair)?: A Lot Help needed standing up from a chair using your arms (e.g., wheelchair or bedside chair)?: A Lot Help needed to walk in hospital room?: A  Lot Help needed climbing 3-5 steps with a railing? : Total 6 Click Score: 11    End of Session Equipment Utilized During Treatment: Gait belt Activity Tolerance: Patient limited by fatigue;Treatment limited secondary to medical complications (Comment)(weakness and cognition) Patient left: in chair;with call bell/phone within reach;with chair alarm set Nurse Communication: Mobility status PT Visit Diagnosis: Unsteadiness on feet (R26.81);Muscle weakness (generalized) (M62.81);Difficulty in walking, not elsewhere classified (R26.2);Ataxic gait (R26.0)    Time: [id number] PT Time Calculation (min) (ACUTE ONLY): 26 min   Charges:   PT Evaluation $PT Eval Moderate Complexity: 1 Mod PT Treatments $Gait Training: 8-22 mins       Ramond Dial 05/03/2019, 8:08 PM   Mee Hives, PT MS Acute Rehab Dept. Number: [place] and [place]

## 2019-05-03 NOTE — Progress Notes (Signed)
[facility] NAME: [name]    MR#:  [id number]  DATE OF BIRTH:  05-13-1941  SUBJECTIVE:  CHIEF COMPLAINT:   Chief Complaint  Patient presents with  . Weakness   -Received 2 units transfusion since admission.   EGD is normal .    -Patient is alert and no complains now.   REVIEW OF SYSTEMS:  Review of Systems  Constitutional: Positive for malaise/fatigue. Negative for fever and weight loss.  HENT: Negative for ear discharge and tinnitus.   Eyes: Negative for blurred vision and double vision.  Respiratory: Negative for cough, sputum production and shortness of breath.   Cardiovascular: Negative for chest pain, palpitations, orthopnea and leg swelling.  Gastrointestinal: Negative for abdominal pain, diarrhea, nausea and vomiting.  Genitourinary: Negative for hematuria.  Musculoskeletal: Negative for back pain.  Skin: Negative for rash.  Neurological: Negative for dizziness, speech change and focal weakness.  Psychiatric/Behavioral: Negative for depression.    DRUG ALLERGIES:  No Known Allergies  VITALS:  Blood pressure 101/62, pulse 67, temperature 98.2 F (36.8 C), temperature source Oral, resp. rate 20, height 6' (1.829 m), weight 64.4 kg, SpO2 100 %.  PHYSICAL EXAMINATION:  Physical Exam   GENERAL:  78 y.o.-year-old thin built well-nourished patient lying in the bed and appears to be comfortable.  EYES: Pupils equal, round, reactive to light and accommodation. No scleral icterus. Extraocular muscles intact.  HEENT: Head atraumatic, normocephalic. Oropharynx and nasopharynx clear.  Dry mucous membranes NECK:  Supple, no jugular venous distention. No thyroid enlargement, no tenderness.  LUNGS: Normal breath sounds bilaterally, no wheezing, rales,rhonchi or crepitation. No use of accessory muscles of respiration.  Decreased bibasilar breath sounds CARDIOVASCULAR: S1, S2 normal. No  rubs, or gallops.  3/6 systolic  murmur is present ABDOMEN: Soft, nontender, nondistended. Bowel sounds present. No organomegaly or mass.  EXTREMITIES: No pedal edema, cyanosis, or clubbing.  NEUROLOGIC: Patient is awake,  following simple commands.  Sensation is intact .  PSYCHIATRIC: The patient is alert , oriented X 1-2 SKIN: No obvious rash, lesion, or ulcer.    LABORATORY PANEL:   CBC Recent Labs  Lab 05/03/19 0509  WBC 8.5  HGB 7.9*  HCT 24.2*  PLT 178   ------------------------------------------------------------------------------------------------------------------  Chemistries  Recent Labs  Lab 04/29/19 1422  05/02/19 0716 05/03/19 0509  NA 143   < > 146* 146*  K 3.4*   < > 2.9* 3.5  CL 108   < > 114* 114*  CO2 23   < > 23 23  GLUCOSE 146*   < > 158* 113*  BUN 88*   < > 48* 33*  CREATININE 1.72*   < > 1.24 1.07  CALCIUM 10.0   < > 8.8* 8.3*  MG 1.9  --  1.7  --   AST 31  --   --   --   ALT 13  --   --   --   ALKPHOS 70  --   --   --   BILITOT 0.5  --   --   --    < > = values in this interval not displayed.   ------------------------------------------------------------------------------------------------------------------  Cardiac Enzymes No results for input(s): TROPONINI in the last 168 hours. ------------------------------------------------------------------------------------------------------------------  RADIOLOGY:  Mr Jeri Cos Wo Contrast  Result Date: 05/02/2019 CLINICAL DATA:  78 year old male with recent head trauma, headache. Prostate cancer. Memory loss. EXAM: MRI HEAD WITHOUT AND WITH CONTRAST TECHNIQUE: Multiplanar, multiecho pulse  sequences of the brain and surrounding structures were obtained without and with intravenous contrast. CONTRAST:  21mL GADAVIST GADOBUTROL 1 MMOL/ML IV SOLN COMPARISON:  Head CT 04/29/2019.  PET-CT 10/09/2018. FINDINGS: Brain: No restricted diffusion to suggest acute infarction. No midline shift, mass effect, evidence of mass lesion, ventriculomegaly,  extra-axial collection or acute intracranial hemorrhage. Cervicomedullary junction and pituitary are within normal limits. Patchy and confluent bilateral cerebral white matter T2 and FLAIR hyperintensity, which in some areas most resembles chronic white matter lacunar disease (series 9, image 20). No chronic cerebral blood products. No cortical encephalomalacia. Mild T2 heterogeneity mostly in the basal ganglia. Patchy T2 hyperintensity in the pons. Within normal limits. Mesial temporal lobe volume loss appears similar to the remaining cerebral volume. No abnormal enhancement identified.  No dural thickening. Vascular: Major intracranial vascular flow voids are preserved. The major dural venous sinuses are enhancing and appear to be patent. Skull and upper cervical spine: Mild degenerative appearing spinal stenosis at C2-C3. Negative visible spinal cord. Visualized bone marrow signal is within normal limits. Sinuses/Orbits: Postoperative changes to both globes otherwise negative orbits. Paranasal sinuses are clear. Other: Trace left mastoid fluid appears inconsequential. Negative nasopharynx. Visible internal auditory structures appear normal. Scalp and face soft tissues appear negative. IMPRESSION: 1. No metastatic disease or acute intracranial abnormality identified. 2. 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 Electronically Signed   By: Genevie Ann M.D.   On: 05/02/2019 17:43    EKG:   Orders placed or performed during the hospital encounter of 04/29/19  . ED EKG  . 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  1.  Acute on chronic anemia-likely anemia of chronic disease, iron levels within normal  limits.  B12 greater than 500 -Hemoglobin 6.0 on admission, baseline is at 10. -Received a total of 2 units packed RBC transfusion since admission.  -Improved after transfusion but again gradually dropping down. -GI has been consulted.  EGD done, no active source of bleeding identified.   -On full liquid diet today.  Continue Protonix orally. -05/02/19 was taken for colonoscopy, but before procedure he had a large dark bowel movement and procedure was canceled and patient was sent back to the room. - now GI advised to have prep again and Colonoscopy on 05/04/19.  2.  Metastatic prostate cancer-patient has stage IV prostate cancer currently on Xtandi. -Overall clinically doing very poorly.  Weight loss, generalized weakness and now with acute anemia. -Palliative care consulted.  Oncology to follow in the hospital as well. -Wife was still not ready for hospice as per the discussion and was recommending to continue treating the treatable and the plan was to discharge home with palliative care when stable. -Oncologist had suggested to do MRI on brain to rule out mets due to change in mental status- no mets found, mental status back to normal now.  3.  Chronic pain from metastatic prostate cancer to balance -Restarted his fentanyl patch this morning. If needed will add oxycodone as needed for pain-however already appears sleeping most of the time.  4.  Hypertension-on low-dose Coreg.  5.  DVT prophylaxis-teds and SCDs only  Spoke to patient's wife in the room today .   All the  records are reviewed and case discussed with Care Management/Social Workerr. Management plans discussed with the patient, family and they are in agreement.  CODE STATUS: Partial code  TOTAL TIME TAKING CARE OF THIS PATIENT: 35 minutes.   POSSIBLE D/C IN 3-4 DAYS, DEPENDING ON CLINICAL CONDITION.   Vaughan Basta M.D on 05/03/2019 at 1:09 PM  Between 7am to 6pm - Pager - [phone]  After 6pm go to  [email or web address] - password [email or web address] [facility]  Office  [phone]  CC: Primary care physician; Tamsen Roers, MD

## 2019-05-03 NOTE — Progress Notes (Signed)
Lucilla Lame, MD [facility]   [address], [place], [place] [address] Phone: [phone] Fax : [phone]   Subjective: The patient was brought down to endoscopy yesterday after the floor was cold and had reassured [place] that the patient was cleaned out despite previous attempts to do a colonoscopy with the patient not completing the prep.  When we brought the patient down to the endoscopy unit there was a large amount of black thick stools in his bed with visualization of him passing completely solid stools.  The procedure was terminated.  He was given 2 L of Colyte yesterday and has been ordered 2 more for today.   Objective: Vital signs in last 24 hours: Vitals:   05/02/19 1330 05/02/19 1520 05/02/19 1939 05/03/19 0416  BP: (!) 104/46 (!) 108/52 (!) 91/52 95/61  Pulse: 77  67 78  Resp: 20  20 20   Temp:   98.3 F (36.8 C) 98.3 F (36.8 C)  TempSrc:   Oral Oral  SpO2: 100%  98% 100%  Weight:    64.4 kg  Height:       Weight change: 0.635 kg  Intake/Output Summary (Last 24 hours) at 05/03/2019 [id number] Last data filed at 05/03/2019 [id number] Gross per 24 hour  Intake 952.08 ml  Output -  Net 952.08 ml     Exam: Heart:: Regular rate and rhythm, S1S2 present or without murmur or extra heart sounds Lungs: normal and clear to auscultation and percussion Abdomen: soft, nontender, normal bowel sounds   Lab Results: @LABTEST2 @ Micro Results: Recent Results (from the past 240 hour(s))  SARS Coronavirus 2 [facility] order, Performed in [facility] hospital lab) Nasopharyngeal Nasopharyngeal Swab     Status: None   Collection Time: 04/29/19  3:57 PM   Specimen: Nasopharyngeal Swab  Result Value Ref Range Status   SARS Coronavirus 2 NEGATIVE NEGATIVE Final    Comment: (NOTE) If result is NEGATIVE SARS-CoV-2 target nucleic acids are NOT DETECTED. The SARS-CoV-2 RNA is generally detectable in upper and lower  respiratory specimens during the acute phase of infection. The lowest   concentration of SARS-CoV-2 viral copies this assay can detect is 250  copies / mL. A negative result does not preclude SARS-CoV-2 infection  and should not be used as the sole basis for treatment or other  patient management decisions.  A negative result may occur with  improper specimen collection / handling, submission of specimen other  than nasopharyngeal swab, presence of viral mutation(s) within the  areas targeted by this assay, and inadequate number of viral copies  (<250 copies / mL). A negative result must be combined with clinical  observations, patient history, and epidemiological information. If result is POSITIVE SARS-CoV-2 target nucleic acids are DETECTED. The SARS-CoV-2 RNA is generally detectable in upper and lower  respiratory specimens dur ing the acute phase of infection.  Positive  results are indicative of active infection with SARS-CoV-2.  Clinical  correlation with patient history and other diagnostic information is  necessary to determine patient infection status.  Positive results do  not rule out bacterial infection or co-infection with other viruses. If result is PRESUMPTIVE POSTIVE SARS-CoV-2 nucleic acids MAY BE PRESENT.   A presumptive positive result was obtained on the submitted specimen  and confirmed on repeat testing.  While 2019 novel coronavirus  (SARS-CoV-2) nucleic acids may be present in the submitted sample  additional confirmatory testing may be necessary for epidemiological  and / or clinical management purposes  to differentiate between  SARS-CoV-2 and other Sarbecovirus currently known to infect humans.  If clinically indicated additional testing with an alternate test  methodology [id number]) is advised. The SARS-CoV-2 RNA is generally  detectable in upper and lower respiratory sp ecimens during the acute  phase of infection. The expected result is Negative. Fact Sheet for Patients:  StrictlyIdeas.no Fact Sheet  for Healthcare Providers: BankingDealers.co.za This test is not yet approved or cleared by the [place] FDA and has been authorized for detection and/or diagnosis of SARS-CoV-2 by FDA under an Emergency Use Authorization (EUA).  This EUA will remain in effect (meaning this test can be used) for the duration of the COVID-19 declaration under Section 564(b)(1) of the Act, 21 U.S.C. section 360bbb-3(b)(1), unless the authorization is terminated or revoked sooner. Performed at [facility], [place], [place] [address]    Studies/Results: Mr Jeri Cos [id number] Contrast  Result Date: 05/02/2019 CLINICAL DATA:  78 year old male with recent head trauma, headache. Prostate cancer. Memory loss. EXAM: MRI HEAD WITHOUT AND WITH CONTRAST TECHNIQUE: Multiplanar, multiecho pulse sequences of the brain and surrounding structures were obtained without and with intravenous contrast. CONTRAST:  57mL GADAVIST GADOBUTROL 1 MMOL/ML IV SOLN COMPARISON:  Head CT 04/29/2019.  PET-CT 10/09/2018. FINDINGS: Brain: No restricted diffusion to suggest acute infarction. No midline shift, mass effect, evidence of mass lesion, ventriculomegaly, extra-axial collection or acute intracranial hemorrhage. Cervicomedullary junction and pituitary are within normal limits. Patchy and confluent bilateral cerebral white matter T2 and FLAIR hyperintensity, which in some areas most resembles chronic white matter lacunar disease (series 9, image 20). No chronic cerebral blood products. No cortical encephalomalacia. Mild T2 heterogeneity mostly in the basal ganglia. Patchy T2 hyperintensity in the pons. Within normal limits. Mesial temporal lobe volume loss appears similar to the remaining cerebral volume. No abnormal enhancement identified.  No dural thickening. Vascular: Major intracranial vascular flow voids are preserved. The major dural venous sinuses are enhancing and appear to be patent. Skull and  upper cervical spine: Mild degenerative appearing spinal stenosis at C2-C3. Negative visible spinal cord. Visualized bone marrow signal is within normal limits. Sinuses/Orbits: Postoperative changes to both globes otherwise negative orbits. Paranasal sinuses are clear. Other: Trace left mastoid fluid appears inconsequential. Negative nasopharynx. Visible internal auditory structures appear normal. Scalp and face soft tissues appear negative. IMPRESSION: 1. No metastatic disease or acute intracranial abnormality identified. 2. 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 Electronically Signed   By: Genevie Ann M.D.   On: 05/02/2019 17:43   Medications: I have reviewed the patient's current medications. Scheduled Meds: . allopurinol  300 mg Oral Daily  . atorvastatin  10 mg Oral Q supper  . carvedilol  6.25 mg Oral BID WC  . feeding supplement (ENSURE ENLIVE)  237 mL Oral TID BM  . fentaNYL  1 patch Transdermal Q72H  . insulin aspart  0-5 Units Subcutaneous QHS  . insulin aspart  0-9 Units Subcutaneous TID WC  . pantoprazole  40 mg Oral QAC breakfast  . vitamin C  500 mg Oral BID   Continuous Infusions: . 0.45 % NaCl with KCl 20 mEq / L 75 mL/hr at 05/02/19 1107   PRN Meds:.acetaminophen **OR** acetaminophen, polyethylene glycol, ziprasidone   Assessment: Active Problems:   Anemia   Weakness   Protein-calorie malnutrition, severe    Plan: This patient had a poor prep for his colonoscopy yesterday and it was canceled prior to inserting the scope due to a  bed full of stool.  The patient was given 2 L of prep yesterday and finished all but 1 glass of it.  The patient will be given another 2 L today and will be set up for a colonoscopy for tomorrow with Dr. Alice Reichert.  The patient and his wife have been explained the plan and agree with it.   LOS: 4 days   Lucilla Lame 05/03/2019, 8:22 AM Pager [phone] 7am-5pm  Check AMION for 5pm -7am coverage  and on weekends

## 2019-05-04 ENCOUNTER — Encounter: Payer: Self-pay | Admitting: Gastroenterology

## 2019-05-04 ENCOUNTER — Inpatient Hospital Stay: Payer: Medicare HMO | Admitting: Anesthesiology

## 2019-05-04 ENCOUNTER — Telehealth: Payer: Self-pay | Admitting: Internal Medicine

## 2019-05-04 ENCOUNTER — Encounter
Admission: EM | Disposition: A | Discharge status: Hospice | Payer: Self-pay | Source: Home / Self Care | Attending: Internal Medicine

## 2019-05-04 HISTORY — PX: COLONOSCOPY WITH PROPOFOL: SHX5780

## 2019-05-04 LAB — CBC
HCT: 23.9 % — ABNORMAL LOW (ref 39.0–52.0)
Hemoglobin: 7.9 g/dL — ABNORMAL LOW (ref 13.0–17.0)
MCH: 31.7 pg (ref 26.0–34.0)
MCHC: 33.1 g/dL (ref 30.0–36.0)
MCV: 96 fL (ref 80.0–100.0)
Platelets: 163 10*3/uL (ref 150–400)
RBC: 2.49 MIL/uL — ABNORMAL LOW (ref 4.22–5.81)
RDW: 18.3 % — ABNORMAL HIGH (ref 11.5–15.5)
WBC: 7.2 10*3/uL (ref 4.0–10.5)
nRBC: 0 % (ref 0.0–0.2)

## 2019-05-04 LAB — HEMOGLOBIN A1C
Hgb A1c MFr Bld: 5.7 % — ABNORMAL HIGH (ref 4.8–5.6)
Mean Plasma Glucose: 117 mg/dL

## 2019-05-04 LAB — GLUCOSE, CAPILLARY
Glucose-Capillary: 93 mg/dL (ref 70–99)
Glucose-Capillary: 99 mg/dL (ref 70–99)
Glucose-Capillary: 98 mg/dL (ref 70–99)
Glucose-Capillary: 124 mg/dL — ABNORMAL HIGH (ref 70–99)

## 2019-05-04 SURGERY — COLONOSCOPY WITH PROPOFOL
Anesthesia: General

## 2019-05-04 MED ORDER — PROPOFOL 10 MG/ML IV BOLUS
INTRAVENOUS | Status: AC
  Filled 2019-05-04: qty 40

## 2019-05-04 MED ORDER — PROPOFOL 10 MG/ML IV BOLUS
INTRAVENOUS | Status: DC | PRN
  Administered 2019-05-04: 30 mg via INTRAVENOUS
  Administered 2019-05-04: 80 mg via INTRAVENOUS

## 2019-05-04 MED ORDER — PROPOFOL 500 MG/50ML IV EMUL
INTRAVENOUS | Status: DC | PRN
  Administered 2019-05-04: 100 ug/kg/min via INTRAVENOUS

## 2019-05-04 MED ORDER — CARBAMIDE PEROXIDE 6.5 % OT SOLN
5.0000 [drp] | Freq: Two times a day (BID) | OTIC | Status: DC
  Administered 2019-05-04 – 2019-05-07 (×7): 5 [drp] via OTIC
  Filled 2019-05-04: qty 15

## 2019-05-04 MED ORDER — FENTANYL CITRATE (PF) 100 MCG/2ML IJ SOLN: 25.0000 ug | INTRAMUSCULAR | Status: DC | PRN

## 2019-05-04 MED ORDER — SODIUM CHLORIDE 0.9 % IV SOLN
INTRAVENOUS | Status: DC
  Administered 2019-05-04: 1000 mL via INTRAVENOUS

## 2019-05-04 MED ORDER — ONDANSETRON HCL 4 MG/2ML IJ SOLN: 4.0000 mg | Freq: Once | INTRAMUSCULAR | Status: DC | PRN

## 2019-05-04 NOTE — Care Management Important Message (Signed)
Important Message  Patient Details  Name: [name] MRN: [id number] Date of Birth: 08/20/1941   Medicare Important Message Given:  Yes     Dannette Barbara 05/04/2019, 11:26 AM

## 2019-05-04 NOTE — Op Note (Signed)
[facility] Gastroenterology Patient Name: [name] Procedure Date: 05/04/2019 4:24 PM MRN: [id number] Account #: [id number] Date of Birth: Nov 20, 1940 Admit Type: Inpatient Age: 78 Room: [facility] ENDO ROOM 2 Gender: Male Note Status: Finalized Procedure:            Colonoscopy Indications:          Heme positive stool, Melena, Acute post hemorrhagic                        anemia Providers:            Benay Pike. Alice Reichert MD, MD Referring MD:         Tamsen Roers MD (Referring MD) Medicines:            Propofol per Anesthesia Complications:        No immediate complications. Procedure:            Pre-Anesthesia Assessment:                       - The risks and benefits of the procedure and the                        sedation options and risks were discussed with the                        patient. All questions were answered and informed                        consent was obtained.                       - Patient identification and proposed procedure were                        verified prior to the procedure by the nurse. The                        procedure was verified in the procedure room.                       - ASA Grade Assessment: III - A patient with severe                        systemic disease.                       - After reviewing the risks and benefits, the patient                        was deemed in satisfactory condition to undergo the                        procedure.                       After obtaining informed consent, the colonoscope was                        passed under direct vision. Throughout the procedure,                        the  patient's blood pressure, pulse, and oxygen                        saturations were monitored continuously. The                        Colonoscope was introduced through the anus and                        advanced to the the cecum, identified by appendiceal                        orifice and ileocecal  valve. The colonoscopy was                        somewhat difficult. The patient tolerated the procedure                        well. The quality of the bowel preparation was fair.                        The ileocecal valve, appendiceal orifice, and rectum                        were photographed. Findings:      The perianal and digital rectal examinations were normal. Pertinent       negatives include normal sphincter tone and no palpable rectal lesions.      A large amount of stool was found in the distal sigmoid colon and in the       ascending colon, interfering with visualization. Lavage of the area was       performed using a large amount of tap water, resulting in clearance with       fair visualization.      A 8 mm polyp was found in the descending colon. The polyp was       pedunculated. Polypectomy was not attempted due to the patient having a       bleeding disorder.      There is no endoscopic evidence of bleeding, inflammation, mass or       colitis in the entire colon.      The exam was otherwise without abnormality on direct and retroflexion       views. Impression:           - Preparation of the colon was fair.                       - Stool in the distal sigmoid colon and in the                        ascending colon.                       - One 8 mm polyp in the descending colon. Resection not                        attempted.                       - The examination was otherwise normal on direct and  retroflexion views.                       - No specimens collected. Recommendation:       - Return patient to hospital ward for ongoing care.                       - GI sign off, call back if we can help. Procedure Code(s):    --- Professional ---                       [phone], Colonoscopy, flexible; diagnostic, including                        collection of specimen(s) by brushing or washing, when                        performed (separate  procedure) Diagnosis Code(s):    --- Professional ---                       D62, Acute posthemorrhagic anemia                       K92.1, Melena (includes Hematochezia)                       R19.5, Other fecal abnormalities                       K63.5, Polyp of colon CPT copyright 2019 American Medical Association. All rights reserved. The codes documented in this report are preliminary and upon coder review may  be revised to meet current compliance requirements. Efrain Sella MD, MD 05/04/2019 4:59:33 PM This report has been signed electronically. Number of Addenda: 0 Note Initiated On: 05/04/2019 4:24 PM Scope Withdrawal Time: 0 hours 5 minutes 3 seconds  Total Procedure Duration: 0 hours 12 minutes 22 seconds  Estimated Blood Loss: Estimated blood loss: none.      [facility]

## 2019-05-04 NOTE — Progress Notes (Signed)
[name]   DOB:05/06/1941   [id number]    Subjective: Patient's colonoscopy was aborted over the weekend because of poor prep.  Is awaiting repeat colonoscopy this morning.  Patient's hemoglobin is 7.9.  A total of 3 units of PRBC transfusion since admission.  As per the wife intermittently continues to be confused.  Objective:  Vitals:   05/04/19 0640 05/04/19 0828  BP: 124/60 110/61  Pulse: 67 77  Resp: 18   Temp: (!) 97.5 F (36.4 C)   SpO2: 92%      Intake/Output Summary (Last 24 hours) at 05/04/2019 0858 Last data filed at 05/04/2019 0200 Gross per 24 hour  Intake 2978.29 ml  Output 100 ml  Net 2878.29 ml    Physical Exam  Constitutional:  Pale appearing Caucasian male patient.  Resting in the bed.  Accompanied by his wife.  Appears cachectic  HENT:  Head: Normocephalic and atraumatic.  Mouth/Throat: Oropharynx is clear and moist. No oropharyngeal exudate.  Eyes: Pupils are equal, round, and reactive to light.  Neck: Normal range of motion. Neck supple.  Cardiovascular: Normal rate and regular rhythm.  Pulmonary/Chest: No respiratory distress. He has no wheezes.  Decreased air entry bilaterally.  Abdominal: Soft. Bowel sounds are normal. He exhibits no distension and no mass. There is no abdominal tenderness. There is no rebound and no guarding.  Musculoskeletal: Normal range of motion.        General: No tenderness or edema.  Neurological: He is alert.  Oriented x1-2.  Skin: Skin is warm.  Psychiatric: Affect normal.     Labs:  Lab Results  Component Value Date   WBC 7.2 05/04/2019   HGB 7.9 (L) 05/04/2019   HCT 23.9 (L) 05/04/2019   MCV 96.0 05/04/2019   PLT 163 05/04/2019   NEUTROABS 5.6 04/29/2019    Lab Results  Component Value Date   NA 146 (H) 05/03/2019   K 3.5 05/03/2019   CL 114 (H) 05/03/2019   CO2 23 05/03/2019    Studies:  Mr Jeri Cos [id number] Contrast  Result Date: 05/02/2019 CLINICAL DATA:  78 year old male with recent head trauma,  headache. Prostate cancer. Memory loss. EXAM: MRI HEAD WITHOUT AND WITH CONTRAST TECHNIQUE: Multiplanar, multiecho pulse sequences of the brain and surrounding structures were obtained without and with intravenous contrast. CONTRAST:  76mL GADAVIST GADOBUTROL 1 MMOL/ML IV SOLN COMPARISON:  Head CT 04/29/2019.  PET-CT 10/09/2018. FINDINGS: Brain: No restricted diffusion to suggest acute infarction. No midline shift, mass effect, evidence of mass lesion, ventriculomegaly, extra-axial collection or acute intracranial hemorrhage. Cervicomedullary junction and pituitary are within normal limits. Patchy and confluent bilateral cerebral white matter T2 and FLAIR hyperintensity, which in some areas most resembles chronic white matter lacunar disease (series 9, image 20). No chronic cerebral blood products. No cortical encephalomalacia. Mild T2 heterogeneity mostly in the basal ganglia. Patchy T2 hyperintensity in the pons. Within normal limits. Mesial temporal lobe volume loss appears similar to the remaining cerebral volume. No abnormal enhancement identified.  No dural thickening. Vascular: Major intracranial vascular flow voids are preserved. The major dural venous sinuses are enhancing and appear to be patent. Skull and upper cervical spine: Mild degenerative appearing spinal stenosis at C2-C3. Negative visible spinal cord. Visualized bone marrow signal is within normal limits. Sinuses/Orbits: Postoperative changes to both globes otherwise negative orbits. Paranasal sinuses are clear. Other: Trace left mastoid fluid appears inconsequential. Negative nasopharynx. Visible internal auditory structures appear normal. Scalp and face soft tissues appear negative. IMPRESSION: 1. No  metastatic disease or acute intracranial abnormality identified. 2. 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 Electronically Signed   By: Genevie Ann M.D.   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  # Severe anemia-Nadir hemoglobin 6; question lower GI bleed. EGD negative; hemoglobin 7.9-stable since admission; but overall not improving.  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  Most recent PET scan-February 2020-negative for any significant progressive disease; chronic bone lesions noted; however, PSA currently is 10.  Concern for progression/clinically.  See discussion below  # General significant decline performance status/weight loss/mental status changes-unclear etiology.  MRI brain negative.-However I suspect patient's general clinical deterioration is likely to progressive malignancy.;  See discussion below  # Chronic pain-second malignancy stable  Prognosis: I had a long discussion with patient and his wife regarding the concern for progressive metastatic prostate cancer/the context of multiple medical problems including-anemia weight loss cachexia/worsening performance status.  With regards to prostate cancer treatment-the next line of therapy would be chemotherapy.  However, patient would be at prohibitively high risk for extreme side effects of chemotherapy for reasons listed above.  I would recommend hospice at this time/the supportive care.  Patient's wife seems to be more open to the idea of hospice.  Understands that it will be difficult for her to take care of him at home by herself.  Interested in placement.  She also wants me to reach out to his son, [phone]; and also her son Vira Blanco [phone].  I will call them later in the afternoon.  Discussed with [employer].     Cammie Sickle, MD 05/04/2019  8:58 AM

## 2019-05-04 NOTE — Progress Notes (Signed)
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 PMH also notable for hydronephrosis status post stenting, moderate aortic stenosis, hypertension, diabetes. Patient was admitted to the hospital on 04/29/19 with symptomatic anemia from GI bleed. He has been evaluated by GI with plan for EGD and colonoscopy.He was referred to palliative care for help address goals.  CODE STATUS: Limited code  PAST MEDICAL HISTORY: Past Medical History:  Diagnosis Date   Aortic stenosis, moderate 01/30/2014   Appetite loss    Arthritis    Atrial flutter ([place]) 02/02/2014   Back pain    Diabetes mellitus without complication (HCC)    diet control   GERD (gastroesophageal reflux disease)    Gout    Heart murmur    Heart murmur    History of kidney stones    HTN (hypertension) 01/30/2014   Hypercholesteremia    Hypertension    Prostate cancer ([place])    metastatic   Weakness of left leg     PAST SURGICAL HISTORY:  Past Surgical History:  Procedure Laterality Date   AMPUTATION TOE Right 08/29/2018   Procedure: Toe IPJ Right;  Surgeon: Albertine Patricia, DPM;  Location: [facility] ORS;  Service: Podiatry;  Laterality: Right;   CATARACT EXTRACTION W/ INTRAOCULAR LENS IMPLANT Bilateral    COLON RESECTION     COLONOSCOPY N/A 05/01/2019   Procedure: COLONOSCOPY;  Surgeon: Toledo, Benay Pike, MD;  Location: [facility] ENDOSCOPY;  Service: Gastroenterology;  Laterality: N/A;   COLONOSCOPY N/A 05/02/2019   Procedure:  COLONOSCOPY;  Surgeon: Lucilla Lame, MD;  Location: [facility] ENDOSCOPY;  Service: Endoscopy;  Laterality: N/A;   CYSTOSCOPY W/ RETROGRADES Left 06/08/2015   Procedure: CYSTOSCOPY WITH RETROGRADE PYELOGRAM;  Surgeon: Nickie Retort, MD;  Location: [facility] ORS;  Service: Urology;  Laterality: Left;   CYSTOSCOPY W/ RETROGRADES Left 05/13/2018   Procedure: CYSTOSCOPY WITH RETROGRADE PYELOGRAM;  Surgeon: Abbie Sons, MD;  Location: [facility] ORS;  Service: Urology;  Laterality: Left;   CYSTOSCOPY W/ RETROGRADES Left 02/03/2019   Procedure: CYSTOSCOPY WITH RETROGRADE PYELOGRAM;  Surgeon: Abbie Sons, MD;  Location: [facility] ORS;  Service: Urology;  Laterality: Left;   CYSTOSCOPY W/ URETERAL STENT PLACEMENT Left 10/05/2015   Procedure: CYSTOSCOPY WITH STENT REPLACEMENT;  Surgeon: Nickie Retort, MD;  Location: [facility] ORS;  Service: Urology;  Laterality: Left;   CYSTOSCOPY W/ URETERAL STENT PLACEMENT Left 01/04/2016   Procedure: CYSTOSCOPY WITH STENT REPLACEMENT;  Surgeon: Nickie Retort, MD;  Location: [facility] ORS;  Service: Urology;  Laterality: Left;   CYSTOSCOPY W/ URETERAL STENT PLACEMENT Left 04/11/2016   Procedure: CYSTOSCOPY WITH STENT REPLACEMENT;  Surgeon: Nickie Retort, MD;  Location: [facility] ORS;  Service: Urology;  Laterality: Left;   CYSTOSCOPY W/ URETERAL STENT PLACEMENT Left 07/18/2016   Procedure: CYSTOSCOPY WITH STENT REPLACEMENT;  Surgeon: Nickie Retort, MD;  Location: [facility] ORS;  Service: Urology;  Laterality: Left;   CYSTOSCOPY W/ URETERAL STENT PLACEMENT Left 11/02/2016   Procedure: CYSTOSCOPY WITH STENT REPLACEMENT;  Surgeon: Nickie Retort, MD;  Location: [facility] ORS;  Service: Urology;  Laterality: Left;   CYSTOSCOPY  W/ URETERAL STENT PLACEMENT Left 02/08/2017   Procedure: CYSTOSCOPY WITH STENT REPLACEMENT;  Surgeon: Nickie Retort, MD;  Location: [facility] ORS;  Service: Urology;  Laterality: Left;   CYSTOSCOPY W/ URETERAL STENT PLACEMENT Left 05/10/2017   Procedure:  CYSTOSCOPY WITH STENT REPLACEMENT;  Surgeon: Nickie Retort, MD;  Location: [facility] ORS;  Service: Urology;  Laterality: Left;   CYSTOSCOPY W/ URETERAL STENT PLACEMENT Left 05/13/2018   Procedure: CYSTOSCOPY WITH STENT Exchange;  Surgeon: Abbie Sons, MD;  Location: [facility] ORS;  Service: Urology;  Laterality: Left;   CYSTOSCOPY W/ URETERAL STENT PLACEMENT Left 02/03/2019   Procedure: CYSTOSCOPY WITH STENT REPLACEMENT;  Surgeon: Abbie Sons, MD;  Location: [facility] ORS;  Service: Urology;  Laterality: Left;   CYSTOSCOPY WITH STENT PLACEMENT Left 08/16/2017   Procedure: CYSTOSCOPY WITH STENT EXCHANGE;  Surgeon: Abbie Sons, MD;  Location: [facility] ORS;  Service: Urology;  Laterality: Left;   CYSTOSCOPY WITH STENT PLACEMENT Left 12/17/2017   Procedure: CYSTOSCOPY WITH STENT EXCHANGE;  Surgeon: Abbie Sons, MD;  Location: [facility] ORS;  Service: Urology;  Laterality: Left;   ESOPHAGOGASTRODUODENOSCOPY N/A 05/01/2019   Procedure: ESOPHAGOGASTRODUODENOSCOPY (EGD);  Surgeon: Toledo, Benay Pike, MD;  Location: [facility] ENDOSCOPY;  Service: Gastroenterology;  Laterality: N/A;   EYE SURGERY Bilateral Sept and Oct.2012   cataract   HERNIA REPAIR     PROSTATECTOMY  1994    HEMATOLOGY/ONCOLOGY HISTORY:  Oncology History Overview Note  # AUG 2016- METASTATIC PROSTATE CA/ STAGE IV; Castrate sensitive [PSA- 48] ; mets- lumbar spine [s/p pal RT to Aug 2016; Dr.Crystal] /Pelvic LN; 1994- Prostate CA s/p Surgery [facility]]; Lupron q4 M; MARCH 2017- Bone scan- L4/Right pubic rami uptake; PSA- 0.1/testosterone-castrate [<3]  # Bone lesions on denosumab;DEC 2016- Recm q 46M  # NOV 2018- CASTRATE RESISTANT; declined chemo; 08/06/2017- start Zytiga+prednisone; Stopped April 24th [rising PSA];  # may 2019- Xofigo #6 on 10/16]  # OCT 16th 2019- X-tandi  # Left hydronephrosis s/p stenting [Dr.Budzyn] --------------------------------------------   DIAGNOSIS: [ ]  castrate resistant prostate  cancer  STAGE:  IV       ;GOALS: palliative  CURRENT/MOST RECENT THERAPY ; Xtandi   Prostate cancer metastatic to bone [facility])    ALLERGIES:  has No Known Allergies.  MEDICATIONS:  Current Facility-Administered Medications  Medication Dose Route Frequency Provider Last Rate Last Dose   0.45 % NaCl with KCl 20 mEq / L infusion   Intravenous Continuous Vaughan Basta, MD 75 mL/hr at 05/04/19 1437     0.9 %  sodium chloride infusion   Intravenous Continuous Efrain Sella, MD 20 mL/hr at 05/04/19 1544 1,000 mL at 05/04/19 1544   [MAR Hold] acetaminophen (TYLENOL) tablet 650 mg  650 mg Oral Q6H PRN Hillary Bow, MD   650 mg at 05/03/19 1216   Or   [MAR Hold] acetaminophen (TYLENOL) suppository 650 mg  650 mg Rectal Q6H PRN Sudini, Alveta Heimlich, MD       [MAR Hold] allopurinol (ZYLOPRIM) tablet 300 mg  300 mg Oral Daily Sudini, Alveta Heimlich, MD   300 mg at 05/03/19 0951   [MAR Hold] atorvastatin (LIPITOR) tablet 10 mg  10 mg Oral Q supper Sudini, Alveta Heimlich, MD   10 mg at 05/03/19 1756   [MAR Hold] carbamide peroxide (DEBROX) 6.5 % OTIC (EAR) solution 5 drop  5 drop Right EAR BID Vaughan Basta, MD   5 drop at 05/04/19 1420   [MAR Hold] carvedilol (COREG) tablet 3.125 mg  3.125 mg Oral BID WC Vaughan Basta,  MD       [MAR Hold] feeding supplement (ENSURE ENLIVE) (ENSURE ENLIVE) liquid 237 mL  237 mL Oral TID BM [name], MD   237 mL at 05/02/19 2000   [MAR Hold] fentaNYL (DURAGESIC) 12 MCG/HR 1 patch  1 patch Transdermal Q72H [name], MD   1 patch at 05/03/19 1216   [MAR Hold] Gerhardt's butt cream   Topical PRN Vaughan Basta, MD       [facility] Hold] insulin aspart (novoLOG) injection 0-5 Units  0-5 Units Subcutaneous QHS Hillary Bow, MD       [facility] Hold] insulin aspart (novoLOG) injection 0-9 Units  0-9 Units Subcutaneous TID WC Hillary Bow, MD   1 Units at 05/03/19 1216   [MAR Hold] pantoprazole (PROTONIX) EC tablet 40 mg  40 mg Oral  QAC breakfast Hillary Bow, MD   40 mg at 05/04/19 0833   [MAR Hold] polyethylene glycol (MIRALAX / GLYCOLAX) packet 17 g  17 g Oral Daily PRN Hillary Bow, MD       [facility] Hold] vitamin C (ASCORBIC ACID) tablet 500 mg  500 mg Oral BID [name], MD   500 mg at 05/03/19 2140   [MAR Hold] ziprasidone (GEODON) injection 10 mg  10 mg Intramuscular Q6H PRN Mansy, Jan A, MD   10 mg at 04/30/19 0624    VITAL SIGNS: BP (!) 105/54    Pulse 72    Temp 99.2 F (37.3 C) (Oral)    Resp 18    Ht 6' (1.829 m)    Wt 141 lb 14.4 oz (64.4 kg)    SpO2 100%    BMI 19.25 kg/m  Filed Weights   04/29/19 2134 05/02/19 0321 05/03/19 0416  Weight: 136 lb 11.2 oz (62 kg) 140 lb 8 oz (63.7 kg) 141 lb 14.4 oz (64.4 kg)    Estimated body mass index is 19.25 kg/m as calculated from the following:   Height as of this encounter: 6' (1.829 m).   Weight as of this encounter: 141 lb 14.4 oz (64.4 kg).  LABS: CBC:    Component Value Date/Time   WBC 7.2 05/04/2019 0416   HGB 7.9 (L) 05/04/2019 0416   HCT 23.9 (L) 05/04/2019 0416   PLT 163 05/04/2019 0416   MCV 96.0 05/04/2019 0416   NEUTROABS 5.6 04/29/2019 1422   LYMPHSABS 0.8 04/29/2019 1422   MONOABS 0.6 04/29/2019 1422   EOSABS 0.1 04/29/2019 1422   BASOSABS 0.1 04/29/2019 1422   Comprehensive Metabolic Panel:    Component Value Date/Time   NA 146 (H) 05/03/2019 0509   K 3.5 05/03/2019 0509   CL 114 (H) 05/03/2019 0509   CO2 23 05/03/2019 0509   BUN 33 (H) 05/03/2019 0509   CREATININE 1.07 05/03/2019 0509   GLUCOSE 113 (H) 05/03/2019 0509   CALCIUM 8.3 (L) 05/03/2019 0509   AST 31 04/29/2019 1422   ALT 13 04/29/2019 1422   ALKPHOS 70 04/29/2019 1422   BILITOT 0.5 04/29/2019 1422   PROT 6.0 (L) 04/29/2019 1422   ALBUMIN 3.1 (L) 04/29/2019 1422    RADIOGRAPHIC STUDIES: Ct Head Wo Contrast  Result Date: 04/29/2019 CLINICAL DATA:  Head trauma with headache. EXAM: CT HEAD WITHOUT CONTRAST TECHNIQUE: Contiguous axial images were  obtained from the base of the skull through the vertex without intravenous contrast. COMPARISON:  None. FINDINGS: Brain: No evidence of acute infarction, hemorrhage, hydrocephalus, extra-axial collection or mass lesion/mass effect. Prominent brain parenchymal volume loss. Moderate deep white matter microangiopathy. Vascular: Calcific  atherosclerotic disease. Skull: Normal. Negative for fracture or focal lesion. Sinuses/Orbits: No acute finding. Other: None. IMPRESSION: 1. No acute intracranial abnormality. 2. Atrophy, chronic microvascular disease. Electronically Signed   By: Fidela Salisbury M.D.   On: 04/29/2019 15:48   Mr Jeri Cos OE Contrast  Result Date: 05/02/2019 CLINICAL DATA:  78 year old male with recent head trauma, headache. Prostate cancer. Memory loss. EXAM: MRI HEAD WITHOUT AND WITH CONTRAST TECHNIQUE: Multiplanar, multiecho pulse sequences of the brain and surrounding structures were obtained without and with intravenous contrast. CONTRAST:  37m GADAVIST GADOBUTROL 1 MMOL/ML IV SOLN COMPARISON:  Head CT 04/29/2019.  PET-CT 10/09/2018. FINDINGS: Brain: No restricted diffusion to suggest acute infarction. No midline shift, mass effect, evidence of mass lesion, ventriculomegaly, extra-axial collection or acute intracranial hemorrhage. Cervicomedullary junction and pituitary are within normal limits. Patchy and confluent bilateral cerebral white matter T2 and FLAIR hyperintensity, which in some areas most resembles chronic white matter lacunar disease (series 9, image 20). No chronic cerebral blood products. No cortical encephalomalacia. Mild T2 heterogeneity mostly in the basal ganglia. Patchy T2 hyperintensity in the pons. Within normal limits. Mesial temporal lobe volume loss appears similar to the remaining cerebral volume. No abnormal enhancement identified.  No dural thickening. Vascular: Major intracranial vascular flow voids are preserved. The major dural venous sinuses are enhancing and  appear to be patent. Skull and upper cervical spine: Mild degenerative appearing spinal stenosis at C2-C3. Negative visible spinal cord. Visualized bone marrow signal is within normal limits. Sinuses/Orbits: Postoperative changes to both globes otherwise negative orbits. Paranasal sinuses are clear. Other: Trace left mastoid fluid appears inconsequential. Negative nasopharynx. Visible internal auditory structures appear normal. Scalp and face soft tissues appear negative. IMPRESSION: 1. No metastatic disease or acute intracranial abnormality identified. 2. 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 Electronically Signed   By: HGenevie AnnM.D.   On: 05/02/2019 17:43    PERFORMANCE STATUS (ECOG) : 3 - Symptomatic, >50% confined to bed  Review of Systems Unless otherwise noted, a complete review of systems is negative.  Physical Exam General: frail appearing, thin Pulmonary: unlabored Extremities: no edema, no joint deformities Skin: no rashes Neurological: Wakes with stimulation  IMPRESSION: Weekend notes reviewed.  Patient was unable to have colonoscopy due to poor bowel prep.  He is pending colonoscopy later this afternoon.  Patient has been sleeping a lot per nurse and wife.  He does wake with stimulation but is intermittently confused.  Case discussed with Dr. BRogue Bussingwho met with patient's wife this morning.  Wife tells me today that she recognizes that patient's cancer may be progressing and he is not felt to be a viable candidate for further treatment.  She says she understands but does "not want to accept it".  SNF has been recommended but SW to be does not feel that insurance will likely cover rehab.  Wife says she would prefer to take patient home. She would be in agreement with hospice. We discussed the option of residential hospice facility but it is unclear if patient would meet their prognostic criteria at this point.  Case discussed with  SW.   I discussed CODE STATUS with wife.  She says that she would not want patient to be resuscitated as it would be unlikely to help and may cause harm such as broken ribs.  She confirms that she also would not want him intubated.  Will change code service to DNR/DNI.  PLAN: -Continue current scope  of treatment -home with hospice when medically ready -DNR   Time Total: 30 minutes  Visit consisted of counseling and education dealing with the complex and emotionally intense issues of symptom management and palliative care in the setting of serious and potentially life-threatening illness.Greater than 50%  of this time was spent counseling and coordinating care related to the above assessment and plan.  Signed by: Altha Harm, PhD, NP-C [phone] (Work Cell)

## 2019-05-04 NOTE — Progress Notes (Signed)
PT Cancellation Note  Patient Details Name: [name] MRN: [id number] DOB: [birth year]   Cancelled Treatment:    Reason Eval/Treat Not Completed: (Pt off the floor at this time, PT to follow up as able.)   Lieutenant Diego PT, DPT 3:44 PM,05/04/19 [phone]

## 2019-05-04 NOTE — Anesthesia Preprocedure Evaluation (Signed)
Anesthesia Evaluation  Patient identified by MRN, date of birth, ID band Patient awake    Reviewed: Allergy & Precautions, H&P , NPO status , reviewed documented beta blocker date and time   Airway Mallampati: II  TM Distance: >3 FB Neck ROM: full    Dental  (+) Upper Dentures, Lower Dentures   Pulmonary pneumonia, resolved, former smoker,    Pulmonary exam normal        Cardiovascular hypertension, + Past MI  Normal cardiovascular exam+ Valvular Problems/Murmurs   ECHO 12/2015 Left ventricle: The cavity size was mildly dilated. Wall   thickness was normal. Systolic function was normal. The estimated   ejection fraction was in the range of 55% to 60%. Left   ventricular diastolic function parameters were normal for the   patient&'s age. - Aortic valve: Moderately calcified annulus. Moderately calcified   leaflets. There was moderate stenosis. There was moderate   regurgitation. Valve area (VTI): 1.16 cm^2. Valve area (Vmax):   1.16 cm^2. Valve area (Vmean): 1.17 cm^2. - Mitral valve: There was mild regurgitation. - Tricuspid valve: There was moderate regurgitation.   Neuro/Psych    GI/Hepatic GERD  Controlled,  Endo/Other  diabetes  Renal/GU Renal disease     Musculoskeletal  (+) Arthritis ,   Abdominal   Peds  Hematology  (+) Blood dyscrasia, anemia ,   Anesthesia Other Findings Past Medical History: 01/30/2014: Aortic stenosis, moderate No date: Appetite loss No date: Arthritis 02/02/2014: Atrial flutter (HCC) No date: Back pain No date: Diabetes mellitus without complication (HCC)     Comment:  diet control No date: GERD (gastroesophageal reflux disease) No date: Gout No date: Heart murmur No date: Heart murmur No date: History of kidney stones 01/30/2014: HTN (hypertension) No date: Hypercholesteremia No date: Hypertension No date: Prostate cancer ([place])     Comment:  metastatic No date: Weakness of  left leg  Past Surgical History: 08/29/2018: AMPUTATION TOE; Right     Comment:  Procedure: Toe IPJ Right;  Surgeon: Albertine Patricia,               DPM;  Location: [facility];  Service: Podiatry;                Laterality: Right; No date: CATARACT EXTRACTION W/ INTRAOCULAR LENS IMPLANT; Bilateral No date: COLON RESECTION 05/01/2019: COLONOSCOPY; N/A     Comment:  Procedure: COLONOSCOPY;  Surgeon: Toledo, Benay Pike, MD;              Location: [facility];  Service: Gastroenterology;                Laterality: N/A; 06/08/2015: CYSTOSCOPY W/ RETROGRADES; Left     Comment:  Procedure: CYSTOSCOPY WITH RETROGRADE PYELOGRAM;                Surgeon: Nickie Retort, MD;  Location: [facility];                Service: Urology;  Laterality: Left; 05/13/2018: CYSTOSCOPY W/ RETROGRADES; Left     Comment:  Procedure: CYSTOSCOPY WITH RETROGRADE PYELOGRAM;                Surgeon: Abbie Sons, MD;  Location: [facility];                Service: Urology;  Laterality: Left; 02/03/2019: CYSTOSCOPY W/ RETROGRADES; Left     Comment:  Procedure: CYSTOSCOPY WITH RETROGRADE PYELOGRAM;  Surgeon: Abbie Sons, MD;  Location: [facility];                Service: Urology;  Laterality: Left; 10/05/2015: CYSTOSCOPY W/ URETERAL STENT PLACEMENT; Left     Comment:  Procedure: CYSTOSCOPY WITH STENT REPLACEMENT;  Surgeon:               Nickie Retort, MD;  Location: [facility];  Service:               Urology;  Laterality: Left; 01/04/2016: CYSTOSCOPY W/ URETERAL STENT PLACEMENT; Left     Comment:  Procedure: CYSTOSCOPY WITH STENT REPLACEMENT;  Surgeon:               Nickie Retort, MD;  Location: [facility];  Service:               Urology;  Laterality: Left; 04/11/2016: CYSTOSCOPY W/ URETERAL STENT PLACEMENT; Left     Comment:  Procedure: CYSTOSCOPY WITH STENT REPLACEMENT;  Surgeon:               Nickie Retort, MD;  Location: [facility];  Service:               Urology;  Laterality:  Left; 07/18/2016: CYSTOSCOPY W/ URETERAL STENT PLACEMENT; Left     Comment:  Procedure: CYSTOSCOPY WITH STENT REPLACEMENT;  Surgeon:               Nickie Retort, MD;  Location: [facility];  Service:               Urology;  Laterality: Left; 11/02/2016: CYSTOSCOPY W/ URETERAL STENT PLACEMENT; Left     Comment:  Procedure: CYSTOSCOPY WITH STENT REPLACEMENT;  Surgeon:               Nickie Retort, MD;  Location: [facility];  Service:               Urology;  Laterality: Left; 02/08/2017: CYSTOSCOPY W/ URETERAL STENT PLACEMENT; Left     Comment:  Procedure: CYSTOSCOPY WITH STENT REPLACEMENT;  Surgeon:               Nickie Retort, MD;  Location: [facility];  Service:               Urology;  Laterality: Left; 05/10/2017: CYSTOSCOPY W/ URETERAL STENT PLACEMENT; Left     Comment:  Procedure: CYSTOSCOPY WITH STENT REPLACEMENT;  Surgeon:               Nickie Retort, MD;  Location: [facility];  Service:               Urology;  Laterality: Left; 05/13/2018: CYSTOSCOPY W/ URETERAL STENT PLACEMENT; Left     Comment:  Procedure: CYSTOSCOPY WITH STENT Exchange;  Surgeon:               Abbie Sons, MD;  Location: [facility];  Service:               Urology;  Laterality: Left; 02/03/2019: CYSTOSCOPY W/ URETERAL STENT PLACEMENT; Left     Comment:  Procedure: CYSTOSCOPY WITH STENT REPLACEMENT;  Surgeon:               Abbie Sons, MD;  Location: [facility];  Service:               Urology;  Laterality: Left; 08/16/2017: CYSTOSCOPY WITH STENT PLACEMENT; Left     Comment:  Procedure: CYSTOSCOPY WITH STENT EXCHANGE;  Surgeon:               Abbie Sons, MD;  Location: [facility];  Service:               Urology;  Laterality: Left; 12/17/2017: CYSTOSCOPY WITH STENT PLACEMENT; Left     Comment:  Procedure: CYSTOSCOPY WITH STENT EXCHANGE;  Surgeon:               Abbie Sons, MD;  Location: [facility];  Service:               Urology;  Laterality: Left; 05/01/2019: ESOPHAGOGASTRODUODENOSCOPY;  N/A     Comment:  Procedure: ESOPHAGOGASTRODUODENOSCOPY (EGD);  Surgeon:               Toledo, Benay Pike, MD;  Location: [facility];                Service: Gastroenterology;  Laterality: N/A; Sept and Oct.2012: EYE SURGERY; Bilateral     Comment:  cataract No date: HERNIA REPAIR 1994: PROSTATECTOMY  BMI    Body Mass Index: 19.06 kg/m      Reproductive/Obstetrics                             Anesthesia Physical  Anesthesia Plan  ASA: III and emergent  Anesthesia Plan: General   Post-op Pain Management:    Induction: Intravenous  PONV Risk Score and Plan: 2 and Treatment may vary due to age or medical condition and TIVA  Airway Management Planned: Nasal Cannula and Natural Airway  Additional Equipment:   Intra-op Plan:   Post-operative Plan:   Informed Consent: I have reviewed the patients History and Physical, chart, labs and discussed the procedure including the risks, benefits and alternatives for the proposed anesthesia with the patient or authorized representative who has indicated his/her understanding and acceptance.     Dental Advisory Given  Plan Discussed with: CRNA  Anesthesia Plan Comments:         Anesthesia Quick Evaluation

## 2019-05-04 NOTE — Progress Notes (Signed)
Order received from Dr Anselm Jungling for debrox

## 2019-05-04 NOTE — Progress Notes (Signed)
Patient was NPO for procedure. When patient returned I asked Dr. Alice Reichert for a diet, full liquid diet was ordered.  Darnelle Catalan, RN

## 2019-05-04 NOTE — Anesthesia Postprocedure Evaluation (Signed)
Anesthesia Post Note  Patient: [name]  Procedure(s) Performed: COLONOSCOPY WITH PROPOFOL (N/A )  Patient location during evaluation: Endoscopy Anesthesia Type: General Level of consciousness: awake and alert and oriented Pain management: pain level controlled Vital Signs Assessment: post-procedure vital signs reviewed and stable Respiratory status: spontaneous breathing Cardiovascular status: blood pressure returned to baseline Postop Assessment: no headache Anesthetic complications: no     Last Vitals:  Vitals:   05/04/19 1710 05/04/19 1717  BP: 102/69 106/63  Pulse: 67 66  Resp: 19 16  Temp:    SpO2: 100% 100%    Last Pain:  Vitals:   05/04/19 1717  TempSrc:   PainSc: 0-No pain                 Girolamo Lortie

## 2019-05-04 NOTE — Interval H&P Note (Signed)
History and Physical Interval Note:  05/04/2019 3:02 PM  [name]  has presented today for surgery, with the diagnosis of GI bleed.  The various methods of treatment have been discussed with the patient and family. After consideration of risks, benefits and other options for treatment, the patient has consented to  Procedure(s): COLONOSCOPY WITH PROPOFOL (N/A) as a surgical intervention.  The patient's history has been reviewed, patient examined, no change in status, stable for surgery.  I have reviewed the patient's chart and labs.  Questions were answered to the patient's satisfaction.     [place], [place]

## 2019-05-04 NOTE — Transfer of Care (Signed)
Immediate Anesthesia Transfer of Care Note  Patient: [name]  Procedure(s) Performed: COLONOSCOPY WITH PROPOFOL (N/A )  Patient Location: PACU  Anesthesia Type:General  Level of Consciousness: sedated  Airway & Oxygen Therapy: Patient Spontanous Breathing and Patient connected to nasal cannula oxygen  Post-op Assessment: Report given to RN and Post -op Vital signs reviewed and stable  Post vital signs: Reviewed and stable  Last Vitals:  Vitals Value Taken Time  BP 120/66 05/04/19 1701  Temp 36.3 C 05/04/19 1701  Pulse 76 05/04/19 1701  Resp 13 05/04/19 1701  SpO2 99 % 05/04/19 1701    Last Pain:  Vitals:   05/04/19 1701  TempSrc:   PainSc: 0-No pain      Patients Stated Pain Goal: 0 ([id number])  Complications: No apparent anesthesia complications

## 2019-05-04 NOTE — Anesthesia Post-op Follow-up Note (Signed)
Anesthesia QCDR form completed.        

## 2019-05-04 NOTE — TOC Initial Note (Signed)
Transition of Care [facility]) - Initial/Assessment Note    Patient Details  Name: [name] MRN: [id number] Date of Birth: 1941/08/20  Transition of Care [facility]) CM/SW Contact:    Annamaria Boots, [place] Phone Number: 05/04/2019, 1:40 PM  Clinical Narrative:  [facility] team consulted for high risk of readmission. CSW met with patient and wife at beside. Patient is asleep through assessment. Wife states that patient lives with her and she is primary caregiver however, prior to hospitalization patient was fairly independent. Per Dr. Rogue Bussing, patient would be a good candidate for hospice services. Patient has also been recommended for SNF level of care. CSW explained both of these to wife. Wife states that she is unsure whether she would like to go home with hospice or go to SNF. CSW explained that patient's insurance may not cover SNF if he is unable to work with therapy 5 days a week. Wife states understanding and is thinking about taking patient home with hospice but wants to talk to Palliative NP and her son prior to making a final decision. CSW will continue to follow for discharge planning.                   Expected Discharge Plan: [place]     Patient Goals and CMS Choice   CMS Medicare.gov Compare Post Acute Care list provided to:: Patient Represenative (must comment)(Wife) Choice offered to / list presented to : Spouse  Expected Discharge Plan and Services Expected Discharge Plan: [place]                                              Prior Living Arrangements/Services     Patient language and need for interpreter reviewed:: Yes        Need for Family Participation in Patient Care: Yes (Comment) Care giver support system in place?: No (comment)   Criminal Activity/Legal Involvement Pertinent to Current Situation/Hospitalization: No - Comment as needed  Activities of Daily Living Home Assistive Devices/Equipment: Walker (specify type) ADL  Screening (condition at time of admission) Patient's cognitive ability adequate to safely complete daily activities?: No Is the patient deaf or have difficulty hearing?: No Does the patient have difficulty seeing, even when wearing glasses/contacts?: No Does the patient have difficulty concentrating, remembering, or making decisions?: No Patient able to express need for assistance with ADLs?: Yes Does the patient have difficulty dressing or bathing?: No Independently performs ADLs?: Yes (appropriate for developmental age) Communication: Independent Dressing (OT): Independent Grooming: Independent Feeding: Independent Bathing: Independent Toileting: Independent In/Out Bed: Independent Walks in Home: Independent Does the patient have difficulty walking or climbing stairs?: Yes Weakness of Legs: Both Weakness of Arms/Hands: Both  Permission Sought/Granted                  Emotional Assessment Appearance:: Appears stated age Attitude/Demeanor/Rapport: Lethargic Affect (typically observed): Quiet Orientation: : Oriented to Self, Oriented to Place Alcohol / Substance Use: Not Applicable Psych Involvement: No (comment)  Admission diagnosis:  Weakness [R53.1] Frequent falls [R29.6] Anemia, unspecified type [D64.9] Patient Active Problem List   Diagnosis Date Noted  . Protein-calorie malnutrition, severe 05/01/2019  . Weakness   . Palliative care encounter 12/30/2018  . Trochanteric bursitis of left hip 07/08/2018  . Goals of care, counseling/discussion 07/05/2017  . Prostate cancer metastatic to bone ([place]) 04/11/2015  .  Atrial flutter ([place]) 02/02/2014  . Dysphagia 01/30/2014  . HTN (hypertension) 01/30/2014  . Other and unspecified hyperlipidemia 01/30/2014  . Overweight 01/30/2014  . Aortic stenosis, moderate 01/30/2014  . Chronic diastolic heart failure ([place]) 01/30/2014  . NSTEMI (non-ST elevated myocardial infarction) ([place]) 01/30/2014  . Toe ulcer ([place]) 01/30/2014   . Severe sepsis ([place]) 01/14/2014  . Acute renal failure ([place]) 01/14/2014  . Metabolic acidosis 01/77/9390  . Hyperkalemia 01/14/2014  . Thrombocytopenia ([place]) 01/14/2014  . Anemia 01/14/2014  . Aspiration pneumonia ([place]) 01/14/2014  . Diabetes mellitus ([place]) 01/14/2014   PCP:  Tamsen Roers, MD Pharmacy:   [facility], [place] - [address] [place] [address] Phone: [phone] Fax: [phone]  [facility] - [address] [place] [address] Phone: [phone] Fax: [phone]  [place], [place] [address] Phone: [phone] Fax: [phone]     Social Determinants of Health (SDOH) Interventions    Readmission Risk Interventions Readmission Risk Prevention Plan 05/04/2019  Transportation Screening Complete  PCP or Specialist Appt within 3-5 Days Complete  HRI or [place] Complete  Social Work Consult for [place] Planning/Counseling Complete  Palliative Care Screening Not Complete  Medication Review (RN Care Manager) Referral to Pharmacy  Some recent data might be hidden

## 2019-05-04 NOTE — Telephone Encounter (Signed)
Spoke to patient's son, Punk regarding patient's declining health/progressive prostate cancer severe anemia.  Recommendation for hospice.  In agreement.  Spoke to patient's [employer]	[place] above.  They are interested in hospice however not interested in hospice at the nursing facility [given the concerns for COVID/this restriction].  Interested at hospice at the patient's own home/with extended support at home. Discussed that I am not aware of similar home hospice care services. I discussed that I would defer to Josh to discuss further disposition planning.

## 2019-05-05 ENCOUNTER — Encounter: Payer: Self-pay | Admitting: Internal Medicine

## 2019-05-05 ENCOUNTER — Telehealth: Payer: Self-pay | Admitting: *Deleted

## 2019-05-05 LAB — GLUCOSE, CAPILLARY
Glucose-Capillary: 92 mg/dL (ref 70–99)
Glucose-Capillary: 138 mg/dL — ABNORMAL HIGH (ref 70–99)
Glucose-Capillary: 105 mg/dL — ABNORMAL HIGH (ref 70–99)
Glucose-Capillary: 123 mg/dL — ABNORMAL HIGH (ref 70–99)

## 2019-05-05 MED ORDER — VITAMIN D (ERGOCALCIFEROL) 1.25 MG (50000 UNIT) PO CAPS
50000.0000 [IU] | ORAL_CAPSULE | ORAL | Status: DC
  Administered 2019-05-05: 50000 [IU] via ORAL
  Filled 2019-05-05: qty 1

## 2019-05-05 NOTE — Progress Notes (Signed)
PT Cancellation Note  Patient Details Name: [name] MRN: [id number] DOB: 1940/12/26   Cancelled Treatment:    Reason Eval/Treat Not Completed: Other (comment)(Per chart review, pt now being transitioned to home with hospice services once medically ready. No additional PT services needed at this time, PT signing off. Please place new order if POC changes and additional PT services are desired.)  9:32 AM, 05/05/19 Etta Grandchild, PT, DPT Physical Therapist - [facility]  [phone] ([place])    [place] C 05/05/2019, 9:32 AM

## 2019-05-05 NOTE — Progress Notes (Signed)
[name]   DOB:August 19, 1941   NK#:[id number]    Subjective: Patient had a colonoscopy yesterday.  Uneventful.  No more blood in stools or black or stools.  As per the wife Patient is more alert; appetite improving.  No nausea no vomiting.  However he still continues to feel weak  Objective:  Vitals:   05/05/19 1140 05/05/19 1608  BP: (!) 85/43 97/64  Pulse: 74 70  Resp: 17   Temp: 99.2 F (37.3 C) 98.5 F (36.9 C)  SpO2: 100% 100%     Intake/Output Summary (Last 24 hours) at 05/05/2019 1654 Last data filed at 05/05/2019 1637 Gross per 24 hour  Intake 1973.63 ml  Output 1500 ml  Net 473.63 ml    Physical Exam  Constitutional: He is oriented to person, place, and time.  Patient resting in the bed comfortable.  Accompanied by his wife.  HENT:  Head: Normocephalic and atraumatic.  Mouth/Throat: Oropharynx is clear and moist. No oropharyngeal exudate.  Eyes: Pupils are equal, round, and reactive to light.  Neck: Normal range of motion. Neck supple.  Cardiovascular: Normal rate and regular rhythm.  Pulmonary/Chest: No respiratory distress. He has no wheezes.  Abdominal: Soft. Bowel sounds are normal. He exhibits no distension and no mass. There is no abdominal tenderness. There is no rebound and no guarding.  Musculoskeletal: Normal range of motion.        General: No tenderness or edema.  Neurological: He is alert and oriented to person, place, and time.  Skin: Skin is warm.  Psychiatric: Affect normal.     Labs:  Lab Results  Component Value Date   WBC 7.2 05/04/2019   HGB 7.9 (L) 05/04/2019   HCT 23.9 (L) 05/04/2019   MCV 96.0 05/04/2019   PLT 163 05/04/2019   NEUTROABS 5.6 04/29/2019    Lab Results  Component Value Date   NA 146 (H) 05/03/2019   K 3.5 05/03/2019   CL 114 (H) 05/03/2019   CO2 23 05/03/2019    Studies:  No results found.  Prostate cancer metastatic to bone [facility]) # 78 year old male patient with metastatic prostate cancer castrate resistant  currently on Gillermina Phy is currently admitted to hospital for severe anemia/multiple falls; progressive weight loss/general decline.  # Severe anemia-[place] hemoglobin 6; question lower GI bleed. EGD/colonoscopy negative; hemoglobin 7.9-stable since admission; but overall not improving.  The etiology is unclear-1 of the possibilities would include bone marrow infiltration by prostate cancer.  This is typically diagnosed based on bone marrow biopsy-this was discussed with the patient's wife/and son [place].  However given patient's poor prognosis/and the fact that this would not change management-I think is reasonable to hold off a bone marrow biopsy at this time.  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  Will discontinue Xtandi.  Patient poor candidate for systemic therapy.  Hospice discussed see below  # Chronic pain-second malignancy stable  Prognosis: I spoke to patient's son [phone]; and also to his Aram [phone]; updated of my clinical concern for progression/declining performance status; my recommendation of hospice.  Discussed that in general hospice is recommended when life expectancy is thought to be less than 6 months.  Given the patient's performance status/progressive malignancy I would suspect 3 to 6 months of life expectancy.    Patrick [place] is interested in hospice at patient's own home/not interested in nursing home placement.  He and his wife, Nira Conn will be looking into options for support for the patient/wife.  Discussed with  Josh Borders. Discussed the above plan with the patient's wife she is in agreement.  She is also interested in patient's follow-up at the cancer center.  We will have her follow-up planned in about 2 to 3 weeks.   Cammie Sickle, MD 05/05/2019  4:54 PM

## 2019-05-05 NOTE — TOC Progression Note (Signed)
Transition of Care [facility]) - Progression Note    Patient Details  Name: [name] MRN: [id number] Date of Birth: 20-Jun-1941  Transition of Care [facility]) CM/SW [place], [place] Phone Number: 05/05/2019, 9:42 AM  Clinical Narrative:   CSW spoke with patient's wife regarding discharge plan. Wife states that they are planning to return home with hospice services through [facility]. Wife also states that her daughter in law is getting equipment for patient. CSW notified Santiago Glad with [facility] of referral. CSW will continue to follow for discharge planning.     Expected Discharge Plan: [place]    Expected Discharge Plan and Services Expected Discharge Plan: [place] Determinants of Health (SDOH) Interventions    Readmission Risk Interventions Readmission Risk Prevention Plan 05/04/2019  Transportation Screening Complete  PCP or Specialist Appt within 3-5 Days Complete  HRI or [place] Complete  Social Work Consult for [place] Planning/Counseling Complete  Palliative Care Screening Not Complete  Medication Review Press [place]) Referral to Pharmacy  Some recent data might be hidden

## 2019-05-05 NOTE — Progress Notes (Signed)
New referral for [facility] hospice services at home received from [place]. Patient eligibility has been confirmed by [facility]. Writer met in the room with patient's wife [name] and spoke via telephone with patient's daughter in law Nira Conn to initiate education regarding hospice services, philosophy and team approach to care with understanding voiced. Marlon Pel and [place] CSW  also made aware that at this time due to COVID 19, hospice is not able to offer in person volunteer visits, virtual visits are available on request.  DME needs addressed, patient will need a hospital bed in place prior to discharge. Family requests delivery on Thursday.  DME ordered as requested.  [name] to be the contact for delivery, all contact numbers and address confirmed.  [place] referral center aware of above. Hospital team updated. Will continue to follow through discharge.  Flo Shanks BSN, RN, [facility] [place] [phone]

## 2019-05-05 NOTE — Progress Notes (Signed)
Nutrition Follow Up Note  DOCUMENTATION CODES:   Severe malnutrition in context of chronic illness  INTERVENTION:   Ensure Enlive po TID, each supplement provides 350 kcal and 20 grams of protein  Vitamin C 500mg  po BID  NUTRITION DIAGNOSIS:   Severe Malnutrition related to cancer and cancer related treatments as evidenced by severe fat depletion, severe muscle depletion, 23 percent weight loss over 6 months.  GOAL:   Patient will meet greater than or equal to 90% of their needs  -progressing   MONITOR:   PO intake, Supplement acceptance, Labs, Weight trends, Skin, I & O's  ASSESSMENT:   78 y.o. male with a known history of prostate cancer, hypertension, diabetes mellitus, atrial flutter, appetite loss presents to the hospital due to weakness and recurrent falls with bruising all over.   Pt with improved appetite and oral intake; pt documented to be eating 100% of meals and drinking some Ensure. Per chart, pt is weight stable since admit. Plan is for patient to discharge home with hospice.   Medications reviewed and include: allopurinol, dulcolax, insulin, protonix, vitamin C, vitamin D3, NaCl w/ KCl @75ml /hr  Labs reviewed: Na 146(H), BUN 33(H)- 9/13 Hgb 7.9(L), Hct 23.9(L)- 9/14  Diet Order:   Diet Order            Diet full liquid Room service appropriate? Yes; Fluid consistency: Thin  Diet effective now             EDUCATION NEEDS:   Education needs have been addressed  Skin:  Skin Assessment: Reviewed RN Assessment(widespread ecchymosis, skin tears, wound L great toe)  Last BM:  9/14  Height:   Ht Readings from Last 1 Encounters:  04/29/19 6' (1.829 m)    Weight:   Wt Readings from Last 1 Encounters:  05/05/19 68 kg    Ideal Body Weight:  80.9 kg  BMI:  Body mass index is 20.34 kg/m.  Estimated Nutritional Needs:   Kcal:  1800-2100kcal/day  Protein:  90-105g/day  Fluid:  >1.6L/day  Koleen Distance MS, RD, LDN Pager #-  [phone] Office#- [phone] After Hours Pager: [phone]

## 2019-05-05 NOTE — Telephone Encounter (Signed)
Amber notified Dr B will be attending and sign orders

## 2019-05-05 NOTE — Telephone Encounter (Signed)
Wife has also agreed with hospice at home. Unfortunately, hospice will not provide extended care services in the home and this was explained to the wife. There are organizations that can provide services but they are usually associated with an out-of-pocket cost. Hospice will provide patient with a SW that can help explore resources. Wife is not interested in SNF or facility placement at this time. We discussed the future option of a residential hospice facility if needed. I spoke yesterday with our SW who will help coordinate hospice referral.

## 2019-05-05 NOTE — Progress Notes (Signed)
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 PMH also notable for hydronephrosis status post stenting, moderate aortic stenosis, hypertension, diabetes. Patient was admitted to the hospital on 04/29/19 with symptomatic anemia from GI bleed. He has been evaluated by GI with plan for EGD and colonoscopy.He was referred to palliative care for help address goals.  CODE STATUS: Limited code  PAST MEDICAL HISTORY: Past Medical History:  Diagnosis Date  . Aortic stenosis, moderate 01/30/2014  . Appetite loss   . Arthritis   . Atrial flutter ([place]) 02/02/2014  . Back pain   . Diabetes mellitus without complication (HCC)    diet control  . GERD (gastroesophageal reflux disease)   . Gout   . Heart murmur   . Heart murmur   . History of kidney stones   . HTN (hypertension) 01/30/2014  . Hypercholesteremia   . Hypertension   . Prostate cancer ([place])    metastatic  . Weakness of left leg     PAST SURGICAL HISTORY:  Past Surgical History:  Procedure Laterality Date  . 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 CATARACT EXTRACTION W/ INTRAOCULAR LENS IMPLANT Bilateral   . COLON RESECTION    . COLONOSCOPY N/A 05/01/2019   Procedure: COLONOSCOPY;  Surgeon: Toledo, Benay Pike, MD;  Location: [facility];  Service: Gastroenterology;  Laterality: N/A;  . COLONOSCOPY N/A 05/02/2019   Procedure:  COLONOSCOPY;  Surgeon: Lucilla Lame, MD;  Location: [facility];  Service: Endoscopy;  Laterality: N/A;  . COLONOSCOPY WITH PROPOFOL N/A 05/04/2019   Procedure: COLONOSCOPY WITH PROPOFOL;  Surgeon: Toledo, Benay Pike, MD;  Location: [facility];  Service: Gastroenterology;  Laterality: N/A;  . CYSTOSCOPY W/ RETROGRADES Left 06/08/2015   Procedure: CYSTOSCOPY WITH RETROGRADE PYELOGRAM;  Surgeon: Nickie Retort, MD;  Location: [facility];  Service: Urology;  Laterality: Left;  . CYSTOSCOPY W/ RETROGRADES Left 05/13/2018   Procedure: CYSTOSCOPY WITH RETROGRADE PYELOGRAM;  Surgeon: Abbie Sons, MD;  Location: [facility];  Service: Urology;  Laterality: Left;  . CYSTOSCOPY W/ RETROGRADES Left 02/03/2019   Procedure: CYSTOSCOPY WITH RETROGRADE PYELOGRAM;  Surgeon: Abbie Sons, MD;  Location: [facility];  Service: Urology;  Laterality: Left;  . CYSTOSCOPY W/ URETERAL STENT PLACEMENT Left 10/05/2015   Procedure: CYSTOSCOPY WITH STENT REPLACEMENT;  Surgeon: Nickie Retort, MD;  Location: [facility];  Service: Urology;  Laterality: Left;  . CYSTOSCOPY W/ URETERAL STENT PLACEMENT Left 01/04/2016   Procedure: CYSTOSCOPY WITH STENT REPLACEMENT;  Surgeon: Nickie Retort, MD;  Location: [facility];  Service: Urology;  Laterality: Left;  . CYSTOSCOPY W/ URETERAL STENT PLACEMENT Left 04/11/2016   Procedure: CYSTOSCOPY WITH STENT REPLACEMENT;  Surgeon: Nickie Retort, MD;  Location: [facility];  Service: Urology;  Laterality: Left;  . CYSTOSCOPY W/ URETERAL STENT PLACEMENT Left 07/18/2016   Procedure: CYSTOSCOPY WITH STENT REPLACEMENT;  Surgeon: Nickie Retort, MD;  Location: [facility];  Service: Urology;  Laterality: Left;  . CYSTOSCOPY W/ URETERAL STENT  PLACEMENT Left 11/02/2016   Procedure: CYSTOSCOPY WITH STENT REPLACEMENT;  Surgeon: Nickie Retort, MD;  Location: [facility];  Service: Urology;  Laterality: Left;  . CYSTOSCOPY W/ URETERAL STENT PLACEMENT Left 02/08/2017   Procedure: CYSTOSCOPY  WITH STENT REPLACEMENT;  Surgeon: Nickie Retort, MD;  Location: [facility];  Service: Urology;  Laterality: Left;  . CYSTOSCOPY W/ URETERAL STENT PLACEMENT Left 05/10/2017   Procedure: CYSTOSCOPY WITH STENT REPLACEMENT;  Surgeon: Nickie Retort, MD;  Location: [facility];  Service: Urology;  Laterality: Left;  . CYSTOSCOPY W/ URETERAL STENT PLACEMENT Left 05/13/2018   Procedure: CYSTOSCOPY WITH STENT Exchange;  Surgeon: Abbie Sons, MD;  Location: [facility];  Service: Urology;  Laterality: Left;  . CYSTOSCOPY W/ URETERAL STENT PLACEMENT Left 02/03/2019   Procedure: CYSTOSCOPY WITH STENT REPLACEMENT;  Surgeon: Abbie Sons, MD;  Location: [facility];  Service: Urology;  Laterality: Left;  . CYSTOSCOPY WITH STENT PLACEMENT Left 08/16/2017   Procedure: CYSTOSCOPY WITH STENT EXCHANGE;  Surgeon: Abbie Sons, MD;  Location: [facility];  Service: Urology;  Laterality: Left;  . CYSTOSCOPY WITH STENT PLACEMENT Left 12/17/2017   Procedure: CYSTOSCOPY WITH STENT EXCHANGE;  Surgeon: Abbie Sons, MD;  Location: [facility];  Service: Urology;  Laterality: Left;  . ESOPHAGOGASTRODUODENOSCOPY N/A 05/01/2019   Procedure: ESOPHAGOGASTRODUODENOSCOPY (EGD);  Surgeon: Toledo, Benay Pike, MD;  Location: [facility];  Service: Gastroenterology;  Laterality: N/A;  . EYE SURGERY Bilateral Sept and Oct.2012   cataract  . HERNIA REPAIR    . PROSTATECTOMY  1994    HEMATOLOGY/ONCOLOGY HISTORY:  Oncology History Overview Note  # AUG 2016- METASTATIC PROSTATE CA/ STAGE IV; Castrate sensitive [PSA- 48] ; mets- lumbar spine [s/p pal RT to Aug 2016; Dr.Crystal] /Pelvic LN; 1994- Prostate CA s/p [facility]]; Lupron q4 M; MARCH 2017- Bone scan- L4/Right pubic rami uptake; PSA- 0.1/testosterone-castrate [<3]  # Bone lesions on denosumab;DEC 2016- Recm q 76M  # NOV 2018- CASTRATE RESISTANT; declined chemo; 08/06/2017- start Zytiga+prednisone; Stopped April 24th [rising PSA];  # may 2019- Xofigo #6 on  10/16]  # OCT 16th 2019- X-tandi  # Left hydronephrosis s/p stenting [Dr.Budzyn] --------------------------------------------   DIAGNOSIS: [ ]  castrate resistant prostate cancer  STAGE:  IV       ;GOALS: palliative  CURRENT/MOST RECENT THERAPY ; Xtandi   Prostate cancer metastatic to bone [facility])    ALLERGIES:  has No Known Allergies.  MEDICATIONS:  Current Facility-Administered Medications  Medication Dose Route Frequency Provider Last Rate Last Dose  . 0.45 % NaCl with KCl 20 mEq / L infusion   Intravenous Continuous Vaughan Basta, MD 75 mL/hr at 05/05/19 1637    . acetaminophen (TYLENOL) tablet 650 mg  650 mg Oral Q6H PRN Hillary Bow, MD   650 mg at 05/03/19 1216   Or  . acetaminophen (TYLENOL) suppository 650 mg  650 mg Rectal Q6H PRN Sudini, Alveta Heimlich, MD      . allopurinol (ZYLOPRIM) tablet 300 mg  300 mg Oral Daily Hillary Bow, MD   300 mg at 05/05/19 [id number]  . atorvastatin (LIPITOR) tablet 10 mg  10 mg Oral Q supper Hillary Bow, MD   10 mg at 05/05/19 1607  . carbamide peroxide (DEBROX) 6.5 % OTIC (EAR) solution 5 drop  5 drop Right EAR BID Vaughan Basta, MD   5 drop at 05/05/19 0937  . carvedilol (COREG) tablet 3.125 mg  3.125 mg Oral BID WC Vaughan Basta, MD   3.125 mg at 05/05/19 0921  .  feeding supplement (ENSURE ENLIVE) (ENSURE ENLIVE) liquid 237 mL  237 mL Oral TID BM [name], MD   237 mL at 05/05/19 1607  . fentaNYL (DURAGESIC) 12 MCG/HR 1 patch  1 patch Transdermal Q72H [name], MD   1 patch at 05/03/19 1216  . Gerhardt's butt cream   Topical PRN Vaughan Basta, MD      . insulin aspart (novoLOG) injection 0-5 Units  0-5 Units Subcutaneous QHS Sudini, Srikar, MD      . insulin aspart (novoLOG) injection 0-9 Units  0-9 Units Subcutaneous TID WC Hillary Bow, MD   1 Units at 05/05/19 1243  . pantoprazole (PROTONIX) EC tablet 40 mg  40 mg Oral QAC breakfast Hillary Bow, MD   40 mg at 05/05/19 [id number]  .  polyethylene glycol (MIRALAX / GLYCOLAX) packet 17 g  17 g Oral Daily PRN Sudini, Alveta Heimlich, MD      . vitamin C (ASCORBIC ACID) tablet 500 mg  500 mg Oral BID [name], MD   500 mg at 05/05/19 0921  . [START ON 05/06/2019] Vitamin D (Ergocalciferol) (DRISDOL) capsule 50,000 Units  50,000 Units Oral Q7 days Vaughan Basta, MD      . ziprasidone (GEODON) injection 10 mg  10 mg Intramuscular Q6H PRN Mansy, Jan A, MD   10 mg at 04/30/19 0624    VITAL SIGNS: BP 97/64 (BP Location: Right Arm)   Pulse 70   Temp 98.5 F (36.9 C) (Oral)   Resp 17   Ht 6' (1.829 m)   Wt 150 lb (68 kg)   SpO2 100%   BMI 20.34 kg/m  Filed Weights   05/02/19 0321 05/03/19 0416 05/05/19 0447  Weight: 140 lb 8 oz (63.7 kg) 141 lb 14.4 oz (64.4 kg) 150 lb (68 kg)    Estimated body mass index is 20.34 kg/m as calculated from the following:   Height as of this encounter: 6' (1.829 m).   Weight as of this encounter: 150 lb (68 kg).  LABS: CBC:    Component Value Date/Time   WBC 7.2 05/04/2019 0416   HGB 7.9 (L) 05/04/2019 0416   HCT 23.9 (L) 05/04/2019 0416   PLT 163 05/04/2019 0416   MCV 96.0 05/04/2019 0416   NEUTROABS 5.6 04/29/2019 1422   LYMPHSABS 0.8 04/29/2019 1422   MONOABS 0.6 04/29/2019 1422   EOSABS 0.1 04/29/2019 1422   BASOSABS 0.1 04/29/2019 1422   Comprehensive Metabolic Panel:    Component Value Date/Time   NA 146 (H) 05/03/2019 0509   K 3.5 05/03/2019 0509   CL 114 (H) 05/03/2019 0509   CO2 23 05/03/2019 0509   BUN 33 (H) 05/03/2019 0509   CREATININE 1.07 05/03/2019 0509   GLUCOSE 113 (H) 05/03/2019 0509   CALCIUM 8.3 (L) 05/03/2019 0509   AST 31 04/29/2019 1422   ALT 13 04/29/2019 1422   ALKPHOS 70 04/29/2019 1422   BILITOT 0.5 04/29/2019 1422   PROT 6.0 (L) 04/29/2019 1422   ALBUMIN 3.1 (L) 04/29/2019 1422    RADIOGRAPHIC STUDIES: Ct Head Wo Contrast  Result Date: 04/29/2019 CLINICAL DATA:  Head trauma with headache. EXAM: CT HEAD WITHOUT CONTRAST  TECHNIQUE: Contiguous axial images were obtained from the base of the skull through the vertex without intravenous contrast. COMPARISON:  None. FINDINGS: Brain: No evidence of acute infarction, hemorrhage, hydrocephalus, extra-axial collection or mass lesion/mass effect. Prominent brain parenchymal volume loss. Moderate deep white matter microangiopathy. Vascular: Calcific atherosclerotic disease. Skull: Normal. Negative for fracture or focal lesion.  Sinuses/Orbits: No acute finding. Other: None. IMPRESSION: 1. No acute intracranial abnormality. 2. Atrophy, chronic microvascular disease. Electronically Signed   By: Fidela Salisbury M.D.   On: 04/29/2019 15:48   Mr Jeri Cos [id number] Contrast  Result Date: 05/02/2019 CLINICAL DATA:  78 year old male with recent head trauma, headache. Prostate cancer. Memory loss. EXAM: MRI HEAD WITHOUT AND WITH CONTRAST TECHNIQUE: Multiplanar, multiecho pulse sequences of the brain and surrounding structures were obtained without and with intravenous contrast. CONTRAST:  62mL GADAVIST GADOBUTROL 1 MMOL/ML IV SOLN COMPARISON:  Head CT 04/29/2019.  PET-CT 10/09/2018. FINDINGS: Brain: No restricted diffusion to suggest acute infarction. No midline shift, mass effect, evidence of mass lesion, ventriculomegaly, extra-axial collection or acute intracranial hemorrhage. Cervicomedullary junction and pituitary are within normal limits. Patchy and confluent bilateral cerebral white matter T2 and FLAIR hyperintensity, which in some areas most resembles chronic white matter lacunar disease (series 9, image 20). No chronic cerebral blood products. No cortical encephalomalacia. Mild T2 heterogeneity mostly in the basal ganglia. Patchy T2 hyperintensity in the pons. Within normal limits. Mesial temporal lobe volume loss appears similar to the remaining cerebral volume. No abnormal enhancement identified.  No dural thickening. Vascular: Major intracranial vascular flow voids are preserved. The major  dural venous sinuses are enhancing and appear to be patent. Skull and upper cervical spine: Mild degenerative appearing spinal stenosis at C2-C3. Negative visible spinal cord. Visualized bone marrow signal is within normal limits. Sinuses/Orbits: Postoperative changes to both globes otherwise negative orbits. Paranasal sinuses are clear. Other: Trace left mastoid fluid appears inconsequential. Negative nasopharynx. Visible internal auditory structures appear normal. Scalp and face soft tissues appear negative. IMPRESSION: 1. No metastatic disease or acute intracranial abnormality identified. 2. 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 Electronically Signed   By: Genevie Ann M.D.   On: 05/02/2019 17:43    PERFORMANCE STATUS (ECOG) : 3 - Symptomatic, >50% confined to bed  Review of Systems Unless otherwise noted, a complete review of systems is negative.  Physical Exam General: frail appearing, thin Pulmonary: CTA ant fields Cards: RRR Extremities: no edema, no joint deformities Skin: no rashes Neurological: alert and oriented, severe generalized weakness  IMPRESSION: Follow-up visit made.  Hemoglobin stable at 7.9 without evidence of acute bleed.  Patient is currently comfortable appearing.  He is more alert today and denies any distressing symptoms.  No family present.  Case discussed with social work and plan is for probable discharge tomorrow home with hospice.  Case discussed with Dr. Rogue Bussing.   PLAN: -Continue current scope of treatment -home with hospice when medically ready -DNR   Time Total: 15 minutes  Visit consisted of counseling and education dealing with the complex and emotionally intense issues of symptom management and palliative care in the setting of serious and potentially life-threatening illness.Greater than 50%  of this time was spent counseling and coordinating care related to the above assessment and plan.   Signed by: Altha Harm, PhD, NP-C [phone] (Work Cell)

## 2019-05-05 NOTE — Telephone Encounter (Signed)
Hospice called asking if Dr B will sign orders on [name] and be attending physician. Please advise

## 2019-05-05 NOTE — Plan of Care (Signed)
Patient doing ok today.  He was more alert, oriented to person and place this morning.  Has slept on and off during the day.  Tolerating his meals well.  No bloody stools.  Hospice liaison came to see the patient.  No significant changes.

## 2019-05-05 NOTE — Progress Notes (Signed)
[place] [facility] [place] NAME: [name]    MR#:  [id number]  DATE OF BIRTH:  October 17, 1940  SUBJECTIVE:  CHIEF COMPLAINT:   Chief Complaint  Patient presents with  . Weakness   -Received 2 units transfusion since admission.   EGD is normal .  No source of bleeding on colonoscopy.  -Patient is alert and no complains now. Waiting for Colonoscopy. Wife is in room.   REVIEW OF SYSTEMS:  Review of Systems  Constitutional: Positive for malaise/fatigue. Negative for fever and weight loss.  HENT: Negative for ear discharge and tinnitus.   Eyes: Negative for blurred vision and double vision.  Respiratory: Negative for cough, sputum production and shortness of breath.   Cardiovascular: Negative for chest pain, palpitations, orthopnea and leg swelling.  Gastrointestinal: Negative for abdominal pain, diarrhea, nausea and vomiting.  Genitourinary: Negative for hematuria.  Musculoskeletal: Negative for back pain.  Skin: Negative for rash.  Neurological: Negative for dizziness, speech change and focal weakness.  Psychiatric/Behavioral: Negative for depression.    DRUG ALLERGIES:  No Known Allergies  VITALS:  Blood pressure 97/64, pulse 70, temperature 98.5 F (36.9 C), temperature source Oral, resp. rate 17, height 6' (1.829 m), weight 68 kg, SpO2 100 %.  PHYSICAL EXAMINATION:  Physical Exam   GENERAL:  78 y.o.-year-old thin built well-nourished patient lying in the bed and appears to be comfortable.  EYES: Pupils equal, round, reactive to light and accommodation. No scleral icterus. Extraocular muscles intact.  HEENT: Head atraumatic, normocephalic. Oropharynx and nasopharynx clear.  Dry mucous membranes NECK:  Supple, no jugular venous distention. No thyroid enlargement, no tenderness.  LUNGS: Normal breath sounds bilaterally, no wheezing, rales,rhonchi or crepitation. No use of accessory muscles of respiration.  Decreased bibasilar breath  sounds CARDIOVASCULAR: S1, S2 normal. No  rubs, or gallops.  3/6 systolic murmur is present ABDOMEN: Soft, nontender, nondistended. Bowel sounds present. No organomegaly or mass.  EXTREMITIES: No pedal edema, cyanosis, or clubbing.  NEUROLOGIC: Patient is awake,  following simple commands.  Sensation is intact .  Generalized weakness. PSYCHIATRIC: The patient is alert , oriented X 2 SKIN: No obvious rash, lesion, or ulcer.    LABORATORY PANEL:   CBC Recent Labs  Lab 05/04/19 0416  WBC 7.2  HGB 7.9*  HCT 23.9*  PLT 163   ------------------------------------------------------------------------------------------------------------------  Chemistries  Recent Labs  Lab 04/29/19 1422  05/02/19 0716 05/03/19 0509  NA 143   < > 146* 146*  K 3.4*   < > 2.9* 3.5  CL 108   < > 114* 114*  CO2 23   < > 23 23  GLUCOSE 146*   < > 158* 113*  BUN 88*   < > 48* 33*  CREATININE 1.72*   < > 1.24 1.07  CALCIUM 10.0   < > 8.8* 8.3*  MG 1.9  --  1.7  --   AST 31  --   --   --   ALT 13  --   --   --   ALKPHOS 70  --   --   --   BILITOT 0.5  --   --   --    < > = values in this interval not displayed.   ------------------------------------------------------------------------------------------------------------------  Cardiac Enzymes No results for input(s): TROPONINI in the last 168 hours. ------------------------------------------------------------------------------------------------------------------  RADIOLOGY:  No results found.  EKG:   Orders placed or performed during the hospital encounter of 04/29/19  . ED  EKG  . 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  1.  Acute on chronic anemia-likely anemia of chronic  disease, iron levels within normal limits.  B12 greater than 500 -Hemoglobin 6.0 on admission, baseline is at 10. -Received a total of 2 units packed RBC transfusion since admission.  -Improved after transfusion but again gradually dropping down. -GI has been consulted.  EGD done, no active source of bleeding identified.   -On full liquid diet.  Continue Protonix orally. -Colonoscopy on 05/04/19.  No clear source of bleeding.  2.  Metastatic prostate cancer-patient has stage IV prostate cancer currently on Xtandi. -Overall clinically doing very poorly.  Weight loss, generalized weakness and now with acute anemia. -Palliative care consulted.  Oncology to follow in the hospital as well. -Oncologist had suggested to do MRI on brain to rule out mets due to change in mental status- no mets found, mental status back to normal now. - Oncologist suggesting approaching hospice and discussing with family. -After discussion wife agreed for hospice arrangements. -Hospice team is working for making arrangements at home with heat treatments and help which would be likely arranged in next 1 to 2 days for discharge.  3.  Chronic pain from metastatic prostate cancer to balance -Restarted his fentanyl patch this morning. If needed will add oxycodone as needed for pain-however already appears sleeping most of the time.  4.  Hypertension-on low-dose Coreg.  5.  DVT prophylaxis-teds and SCDs only  Spoke to patient's wife in the room today .   All the records are reviewed and case discussed with Care Management/Social Workerr. Management plans discussed with the patient, family and they are in agreement.  CODE STATUS: Partial code.  TOTAL TIME TAKING CARE OF THIS PATIENT: 35 minutes.   POSSIBLE D/C IN 2-3 DAYS, DEPENDING ON CLINICAL CONDITION. Family is discussing today with palliative about hospice arrangements.  Vaughan Basta M.D on 05/05/2019 at 5:49 PM  Between 7am to 6pm - Pager -  [phone]  After 6pm go to [email or web address] - password [email or web address] [facility]  Office  [phone]  CC: Primary care physician; Tamsen Roers, MD

## 2019-05-05 NOTE — Progress Notes (Signed)
[name] [place] NAME: [name]    MR#:  [id number]  DATE OF BIRTH:  [birth year]  SUBJECTIVE:  CHIEF COMPLAINT:   Chief Complaint  Patient presents with  . Weakness   -Received 2 units transfusion since admission.   EGD is normal .    -Patient is alert and no complains now. Waiting for Colonoscopy. Wife is in room.   REVIEW OF SYSTEMS:  Review of Systems  Constitutional: Positive for malaise/fatigue. Negative for fever and weight loss.  HENT: Negative for ear discharge and tinnitus.   Eyes: Negative for blurred vision and double vision.  Respiratory: Negative for cough, sputum production and shortness of breath.   Cardiovascular: Negative for chest pain, palpitations, orthopnea and leg swelling.  Gastrointestinal: Negative for abdominal pain, diarrhea, nausea and vomiting.  Genitourinary: Negative for hematuria.  Musculoskeletal: Negative for back pain.  Skin: Negative for rash.  Neurological: Negative for dizziness, speech change and focal weakness.  Psychiatric/Behavioral: Negative for depression.    DRUG ALLERGIES:  No Known Allergies  VITALS:  Blood pressure 100/65, pulse 76, temperature 98.8 F (37.1 C), temperature source Oral, resp. rate 18, height 6' (1.829 m), weight 68 kg, SpO2 100 %.  PHYSICAL EXAMINATION:  Physical Exam   GENERAL:  78 y.o.-year-old thin built well-nourished patient lying in the bed and appears to be comfortable.  EYES: Pupils equal, round, reactive to light and accommodation. No scleral icterus. Extraocular muscles intact.  HEENT: Head atraumatic, normocephalic. Oropharynx and nasopharynx clear.  Dry mucous membranes NECK:  Supple, no jugular venous distention. No thyroid enlargement, no tenderness.  LUNGS: Normal breath sounds bilaterally, no wheezing, rales,rhonchi or crepitation. No use of accessory muscles of respiration.  Decreased bibasilar breath sounds CARDIOVASCULAR: S1, S2 normal.  No  rubs, or gallops.  3/6 systolic murmur is present ABDOMEN: Soft, nontender, nondistended. Bowel sounds present. No organomegaly or mass.  EXTREMITIES: No pedal edema, cyanosis, or clubbing.  NEUROLOGIC: Patient is awake,  following simple commands.  Sensation is intact .  PSYCHIATRIC: The patient is alert , oriented X 2 SKIN: No obvious rash, lesion, or ulcer.    LABORATORY PANEL:   CBC Recent Labs  Lab 05/04/19 0416  WBC 7.2  HGB 7.9*  HCT 23.9*  PLT 163   ------------------------------------------------------------------------------------------------------------------  Chemistries  Recent Labs  Lab 04/29/19 1422  05/02/19 0716 05/03/19 0509  NA 143   < > 146* 146*  K 3.4*   < > 2.9* 3.5  CL 108   < > 114* 114*  CO2 23   < > 23 23  GLUCOSE 146*   < > 158* 113*  BUN 88*   < > 48* 33*  CREATININE 1.72*   < > 1.24 1.07  CALCIUM 10.0   < > 8.8* 8.3*  MG 1.9  --  1.7  --   AST 31  --   --   --   ALT 13  --   --   --   ALKPHOS 70  --   --   --   BILITOT 0.5  --   --   --    < > = values in this interval not displayed.   ------------------------------------------------------------------------------------------------------------------  Cardiac Enzymes No results for input(s): TROPONINI in the last 168 hours. ------------------------------------------------------------------------------------------------------------------  RADIOLOGY:  No results found.  EKG:   Orders placed or performed during the hospital encounter of 04/29/19  . ED EKG  . 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  1.  Acute on chronic anemia-likely anemia of chronic disease, iron levels within normal limits.  B12 greater than 500  -Hemoglobin 6.0 on admission, baseline is at 10. -Received a total of 2 units packed RBC transfusion since admission.  -Improved after transfusion but again gradually dropping down. -GI has been consulted.  EGD done, no active source of bleeding identified.   -On full liquid diet today.  Continue Protonix orally. -05/02/19 was taken for colonoscopy, but before procedure he had a large dark bowel movement and procedure was canceled and patient was sent back to the room. - now GI advised to have prep again and Colonoscopy on 05/04/19.  2.  Metastatic prostate cancer-patient has stage IV prostate cancer currently on Xtandi. -Overall clinically doing very poorly.  Weight loss, generalized weakness and now with acute anemia. -Palliative care consulted.  Oncology to follow in the hospital as well. -Wife was still not ready for hospice as per the discussion and was recommending to continue treating the treatable and the plan was to discharge home with palliative care when stable. -Oncologist had suggested to do MRI on brain to rule out mets due to change in mental status- no mets found, mental status back to normal now. - Oncologist suggesting approaching hospice and discussing with family.  3.  Chronic pain from metastatic prostate cancer to balance -Restarted his fentanyl patch this morning. If needed will add oxycodone as needed for pain-however already appears sleeping most of the time.  4.  Hypertension-on low-dose Coreg.  5.  DVT prophylaxis-teds and SCDs only  Spoke to patient's wife in the room today .   All the records are reviewed and case discussed with Care Management/Social Workerr. Management plans discussed with the patient, family and they are in agreement.  CODE STATUS: Partial code  TOTAL TIME TAKING CARE OF THIS PATIENT: 35 minutes.   POSSIBLE D/C IN 2-3 DAYS, DEPENDING ON CLINICAL CONDITION. Family is discussing today with palliative about hospice arrangements.   Vaughan Basta M.D on 05/05/2019 at 8:10 AM  Between 7am to 6pm - Pager - [phone]  After 6pm go to [email or web address] - password [email or web address] [facility]  Office  [phone]  CC: Primary care physician; Tamsen Roers, MD

## 2019-05-05 NOTE — Telephone Encounter (Signed)
Yes, Dr. B will be attending provider for hospice.

## 2019-05-06 LAB — GLUCOSE, CAPILLARY
Glucose-Capillary: 103 mg/dL — ABNORMAL HIGH (ref 70–99)
Glucose-Capillary: 155 mg/dL — ABNORMAL HIGH (ref 70–99)
Glucose-Capillary: 128 mg/dL — ABNORMAL HIGH (ref 70–99)
Glucose-Capillary: 131 mg/dL — ABNORMAL HIGH (ref 70–99)

## 2019-05-06 NOTE — Progress Notes (Signed)
Follow up visit made to new referral for [facility] at home. Patient seen sitting up in bed, alert and interactive, daughter in Engineer, petroleum at bedside. Confirmed DME to be delivered tomorrow, and planned discharge home once DME is in place. Patient will require EMS transport with signed out of facility DNR in place. Will continue to follow through discharge. Flo Shanks BSN, RN, [facility] [phone]

## 2019-05-06 NOTE — Progress Notes (Signed)
[facility] [place] NAME: [name]    MR#:  [id number]  DATE OF BIRTH:  [birth year]  SUBJECTIVE:  CHIEF COMPLAINT:   Chief Complaint  Patient presents with  . Weakness   -Received 2 units transfusion since admission.   EGD is normal .  No source of bleeding on colonoscopy.  -Patient is alert and no complains now. Waiting for . Wife is in room.  They are waiting for hospital bed to be delivered at home which is scheduled for tomorrow   REVIEW OF SYSTEMS:  Review of Systems  Constitutional: Positive for malaise/fatigue. Negative for fever and weight loss.  HENT: Negative for ear discharge and tinnitus.   Eyes: Negative for blurred vision and double vision.  Respiratory: Negative for cough, sputum production and shortness of breath.   Cardiovascular: Negative for chest pain, palpitations, orthopnea and leg swelling.  Gastrointestinal: Negative for abdominal pain, diarrhea, nausea and vomiting.  Genitourinary: Negative for hematuria.  Musculoskeletal: Negative for back pain.  Skin: Negative for rash.  Neurological: Negative for dizziness, speech change and focal weakness.  Psychiatric/Behavioral: Negative for depression.   DRUG ALLERGIES:  No Known Allergies  VITALS:  Blood pressure 96/64, pulse 73, temperature 100.3 F (37.9 C), temperature source Oral, resp. rate 18, height 6' (1.829 m), weight 66.5 kg, SpO2 100 %.  PHYSICAL EXAMINATION:  Physical Exam   GENERAL:  78 y.o.-year-old thin built well-nourished patient lying in the bed and appears to be comfortable. Pallor + EYES: Pupils equal, round, reactive to light and accommodation. No scleral icterus. Extraocular muscles intact.  HEENT: Head atraumatic, normocephalic. Oropharynx and nasopharynx clear.  Dry mucous membranes NECK:  Supple, no jugular venous distention. No thyroid enlargement, no tenderness.  LUNGS: Normal breath sounds bilaterally, no wheezing, rales,rhonchi or  crepitation. No use of accessory muscles of respiration.  Decreased bibasilar breath sounds CARDIOVASCULAR: S1, S2 normal. No  rubs, or gallops.  3/6 systolic murmur is present ABDOMEN: Soft, nontender, nondistended. Bowel sounds present. No organomegaly or mass.  EXTREMITIES: No pedal edema, cyanosis, or clubbing.  NEUROLOGIC: Patient is awake,  following simple commands.  Sensation is intact .  Generalized weakness. PSYCHIATRIC: The patient is alert , oriented X 2 SKIN: No obvious rash, lesion, or ulcer.    LABORATORY PANEL:   CBC Recent Labs  Lab 05/04/19 0416  WBC 7.2  HGB 7.9*  HCT 23.9*  PLT 163   ------------------------------------------------------------------------------------------------------------------  Chemistries  Recent Labs  Lab 05/02/19 0716 05/03/19 0509  NA 146* 146*  K 2.9* 3.5  CL 114* 114*  CO2 23 23  GLUCOSE 158* 113*  BUN 48* 33*  CREATININE 1.24 1.07  CALCIUM 8.8* 8.3*  MG 1.7  --    ------------------------------------------------------------------------------------------------------------------  Cardiac Enzymes No results for input(s): TROPONINI in the last 168 hours. ------------------------------------------------------------------------------------------------------------------  RADIOLOGY:  No results found.  EKG:   Orders placed or performed during the hospital encounter of 04/29/19  . ED EKG  . 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  1.  Acute on chronic anemia-likely anemia of chronic disease, iron levels within normal limits.  B12 greater than 500 -Hemoglobin 6.0 on admission, baseline is at 10. -Received a total of 2 units  packed RBC transfusion since  admission.  -Improved after transfusion but again gradually dropping down. -GI has been consulted.  EGD done, no active source of bleeding identified.   -tolerating diet.  Continue Protonix orally. -Colonoscopy on 05/04/19.  No clear source of bleeding.  2.  Metastatic prostate cancer-patient has stage IV prostate cancer currently on Xtandi. -Overall clinically doing very poorly.  Weight loss, generalized weakness and now with acute anemia. -Palliative care consulted.  Oncology to follow in the hospital as well. -Oncologist had suggested to do MRI on brain to rule out mets due to change in mental status- no mets found, mental status back to normal now. - Oncologist suggesting approaching hospice and discussing with family. -After discussion wife agreed for hospice arrangements. -Hospice team is planning on delivering Hospice bed tomorrow after which he can be D/Ced.  3.  Chronic pain from metastatic prostate cancer to balance -Restarted his fentanyl patch  If needed will add oxycodone as needed for pain-however already appears sleeping most of the time.  4.  Hypertension-on low-dose Coreg.  5.  DVT prophylaxis-teds and SCDs only  Spoke to patient's wife in the room today .   All the records are reviewed and case discussed with Care Management/Social Workerr. Management plans discussed with the patient, family and they are in agreement.  CODE STATUS: Partial code.  TOTAL TIME TAKING CARE OF THIS PATIENT: 35 minutes.   POSSIBLE D/C IN 1 DAYS, DEPENDING ON CLINICAL CONDITION. Waiting for Hospital bed delivery tomorrow   Max Sane M.D on 05/06/2019 at 2:26 PM  Between 7am to 6pm - Pager - [phone]  After 6pm go to [email or web address] - password [email or web address] [place] [facility]  Office  [phone]  CC: Primary care physician; Tamsen Roers, MD

## 2019-05-07 ENCOUNTER — Encounter: Payer: Self-pay | Admitting: Gastroenterology

## 2019-05-07 LAB — GLUCOSE, CAPILLARY
Glucose-Capillary: 112 mg/dL — ABNORMAL HIGH (ref 70–99)
Glucose-Capillary: 146 mg/dL — ABNORMAL HIGH (ref 70–99)

## 2019-05-07 NOTE — TOC Transition Note (Signed)
Transition of Care [facility]) - CM/SW Discharge Note   Patient Details  Name: [name] MRN: [id number] Date of Birth: Jun 16, 1941  Transition of [facility] CM/SW Contact:  Su Hilt, RN Phone Number: 05/07/2019, 11:22 AM   Clinical Narrative:     Patient to discharge home with [facility] today after the DME is delivered will transport via EMS, bedside nurse to call for transport when ready  Final next level of care: Home w Hospice Care Barriers to Discharge: Barriers Resolved   Patient Goals and CMS Choice   CMS Medicare.gov Compare Post Acute Care list provided to:: Patient Represenative (must comment)(Wife) Choice offered to / list presented to : Spouse  Discharge Placement                       Discharge Plan and Services                                     Social Determinants of Health (SDOH) Interventions     Readmission Risk Interventions Readmission Risk Prevention Plan 05/04/2019  Transportation Screening Complete  PCP or Specialist Appt within 3-5 Days Complete  HRI or [place] Complete  Social Work Consult for [place] Planning/Counseling Complete  Palliative Care Screening Not Complete  Medication Review Press photographer) Referral to Pharmacy  Some recent data might be hidden

## 2019-05-07 NOTE — Discharge Instructions (Signed)
Hospice °Hospice is a service that is designed to provide people who are terminally ill and their families with medical, spiritual, and psychological support. Its aim is to improve your quality of life by keeping you as comfortable as possible in the final stages of life. °Who will be my providers when I begin hospice care? °Hospice teams often include: °· A nurse. °· A doctor. The hospice doctor will be available for your care, but you can include your regular doctor or nurse practitioner. °· A social worker. °· A counselor. °· A religious leader (such as a chaplain). °· A dietitian. °· Therapists. °· Trained volunteers who can help with care. °What services does hospice provide? °Hospice services can vary depending on the center or organization. Generally, they include: °· Ways to keep you comfortable, such as: °? Providing care in your home or in a home-like setting. °? Working with your family and friends to help meet your needs. °? Allowing you to enjoy the support of loved ones by receiving much of your basic care from family and friends. °· Pain relief and symptom management. The staff will supply all necessary medicines and equipment so that you can stay comfortable and alert enough to enjoy the company of your friends and family. °· Visits or care from a nurse and doctor. This may include 24-hour on-call services. °· Companionship when you are alone. °· Allowing you and your family to rest. Hospice staff may do light housekeeping, prepare meals, and run errands. °· Counseling. They will make sure your emotional, spiritual, and social needs are being met, as well as those needs of your family members. °· Spiritual care. This will be individualized to meet your needs and your family's needs. It may involve: °? Helping you and your family understand the dying process. °? Helping you say goodbye to your family and friends. °? Performing a specific religious ceremony or ritual. °· Massage. °· Nutrition  therapy. °· Physical and occupational therapy. °· Short-term inpatient care, if something cannot be managed in the home. °· Art or music therapy. °· Bereavement support for grieving family members. °When should hospice care begin? °Most people who use hospice are believed to have less than 6 months to live. °· Your family and health care providers can help you decide when hospice services should begin. °· If you live longer than 6 months but your condition does not improve, your doctor may be able to approve you for continued hospice care. °· If your condition improves, you may discontinue the program. °What should I consider before selecting a program? °Most hospice programs are run by nonprofit, independent organizations. Some are affiliated with hospitals, nursing homes, or home health care agencies. Hospice programs can take place in your home or at a hospice center, hospital, or skilled nursing facility. When choosing a hospice program, ask the following questions: °· What services are available to me? °· What services will be offered to my loved ones? °· How involved will my loved ones be? °· How involved will my health care provider be? °· Who makes up the hospice care team? How are they trained or screened? °· How will my pain and symptoms be managed? °· If my circumstances change, can the services be provided in a different setting, such as my home or in the hospital? °· Is the program reviewed and licensed by the state or certified in some other way? °· What does it cost? Is it covered by insurance? °· If I choose a hospice   center or nursing home, where is the hospice center located? Is it convenient for family and friends? °· If I choose a hospice center or nursing home, can my family and friends visit any time? °· Will you provide emotional and spiritual support? °· Who can my family call with questions? °Where can I learn more about hospice? °You can learn about existing hospice programs in your area  from your health care providers. You can also read more about hospice online. The websites of the following organizations have helpful information: °· National Hospice and Palliative Care Organization (NHPCO): www.nhpco.org °· National Association for Home Care & Hospice (NAHC): www.nahc.org °· Hospice Foundation of America (HFA): www.hospicefoundation.org °· American Cancer Society (ACS): www.cancer.org °· Hospice Net: www.hospicenet.org °· Visiting Nurse Associations of America (VNAA): www.vnaa.org °You may also find more information by contacting the following agencies: °· A local agency on aging. °· Your local United Way chapter. °· Your state's department of health or social services. °Summary °· Hospice is a service that is designed to provide people who are terminally ill and their families with medical, spiritual, and psychological support. °· Hospice aims to improve your quality of life by keeping you as comfortable as possible in the final stages of life. °· Hospice teams often include a doctor, nurse, social worker, counselor, religious leader,dietitian, therapists, and volunteers. °· Hospice care generally includes medicine for symptom management, visits from doctors and nurses, physical and occupational therapy, nutrition counseling, spiritual and emotional counseling, caregiver support, and bereavement support for grieving family members. °· Hospice programs can take place in your home or at a hospice center, hospital, or skilled nursing facility. °This information is not intended to replace advice given to you by your health care provider. Make sure you discuss any questions you have with your health care provider. °Document Released: 11/23/2003 Document Revised: 07/19/2017 Document Reviewed: 08/28/2016 °Elsevier Patient Education © 2020 Elsevier Inc. ° °

## 2019-05-07 NOTE — Progress Notes (Signed)
[name]  A and O x 2 VSS. Pt tolerating diet well. No complaints of pain or nausea. IV removed intact, prescriptions given. Pt discharge instructions sent with pt to receiving hospice nurse. Pt discharged via EMS to home with hospice. Pt discharged with external catheter.   Allergies as of 05/07/2019   No Known Allergies     Medication List    STOP taking these medications   acetaminophen 500 MG tablet Commonly known as: TYLENOL   atorvastatin 10 MG tablet Commonly known as: LIPITOR   carvedilol 6.25 MG tablet Commonly known as: COREG   famotidine 40 MG tablet Commonly known as: PEPCID   ferrous sulfate 325 (65 FE) MG tablet   hydrochlorothiazide 12.5 MG capsule Commonly known as: MICROZIDE   metFORMIN 500 MG tablet Commonly known as: GLUCOPHAGE   mirabegron ER 25 MG Tb24 tablet Commonly known as: MYRBETRIQ   pantoprazole 40 MG tablet Commonly known as: PROTONIX   triamcinolone 0.025 % ointment Commonly known as: KENALOG   vitamin B-12 1000 MCG tablet Commonly known as: CYANOCOBALAMIN   vitamin C 500 MG tablet Commonly known as: ASCORBIC ACID   Vitamin D (Ergocalciferol) 1.25 MG (50000 UT) Caps capsule Commonly known as: DRISDOL     TAKE these medications   allopurinol 300 MG tablet Commonly known as: ZYLOPRIM Take 300 mg by mouth daily.   aspirin 81 MG tablet Take 81 mg by mouth daily.   chlorthalidone 25 MG tablet Commonly known as: HYGROTON Take 25 mg by mouth daily.   fentaNYL 50 MCG/HR Commonly known as: [place] 1 patch onto the skin every 3 (three) days.   ondansetron 8 MG tablet Commonly known as: ZOFRAN Take 1 tablet (8 mg total) by mouth every 8 (eight) hours as needed for nausea or vomiting.   oxybutynin 5 MG 24 hr tablet Commonly known as: DITROPAN-XL Take 1 tablet (5 mg total) by mouth 3 (three) times daily. What changed:   how much to take  when to take this   Xtandi 40 MG capsule Generic drug: enzalutamide TAKE 4  CAPSULES (160 MG TOTAL) BY MOUTH DAILY.       Vitals:   05/07/19 0542 05/07/19 1147  BP: 123/78 119/73  Pulse: 72 66  Resp: 16 16  Temp: 98.4 F (36.9 C) 98.5 F (36.9 C)  SpO2: 100% 100%    [name] [name]

## 2019-05-07 NOTE — Care Management Important Message (Signed)
Important Message  Patient Details  Name: [name] MRN: [id number] Date of Birth: 06/25/1941   Medicare Important Message Given:  Yes     Dannette Barbara 05/07/2019, 1:21 PM

## 2019-05-09 NOTE — Discharge Summary (Signed)
[facility] NAME: [name]    MR#:  [id number]  DATE OF BIRTH:  1941/04/14  DATE OF ADMISSION:  04/29/2019   ADMITTING PHYSICIAN: Hillary Bow, MD  DATE OF DISCHARGE: 05/07/2019  1:49 PM  PRIMARY CARE PHYSICIAN: Tamsen Roers, MD   ADMISSION DIAGNOSIS:  Weakness [R53.1] Frequent falls [R29.6] Anemia, unspecified type [D64.9] DISCHARGE DIAGNOSIS:  Active Problems:   Anemia   Weakness   Protein-calorie malnutrition, severe  SECONDARY DIAGNOSIS:   Past Medical History:  Diagnosis Date  . Aortic stenosis, moderate 01/30/2014  . Appetite loss   . Arthritis   . Atrial flutter ([place]) 02/02/2014  . Back pain   . Diabetes mellitus without complication (HCC)    diet control  . GERD (gastroesophageal reflux disease)   . Gout   . Heart murmur   . Heart murmur   . History of kidney stones   . HTN (hypertension) 01/30/2014  . Hypercholesteremia   . Hypertension   . Prostate cancer ([place])    metastatic  . 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  1.  Acute on chronic anemia-likely anemia of chronic disease, iron levels within normal limits.  B12 greater than 500 -Hemoglobin 6.0 on admission, baseline is at 10. -Received a total of 2 units packed RBC transfusion since admission.  -Improved after transfusion but again gradually dropping down. -GI has been consulted.  EGD done, no active source of bleeding identified.   -tolerating diet.  Continue Protonix orally. -Colonoscopy on 05/04/19.  No clear source of bleeding.  2.  Metastatic prostate cancer-patient has stage IV prostate cancer currently on Xtandi. -Overall clinically doing very poorly.    3.  Chronic pain from  metastatic prostate cancer to balance  4.  Hypertension  DISCHARGE CONDITIONS:  fair CONSULTS OBTAINED:  Treatment Team:  Cammie Sickle, MD DRUG ALLERGIES:  No Known Allergies DISCHARGE MEDICATIONS:   Allergies as of 05/07/2019   No Known Allergies     Medication List    STOP taking these medications   acetaminophen 500 MG tablet Commonly known as: TYLENOL   atorvastatin 10 MG tablet Commonly known as: LIPITOR   carvedilol 6.25 MG tablet Commonly known as: COREG   famotidine 40 MG tablet Commonly known as: PEPCID   ferrous sulfate 325 (65 FE) MG tablet   hydrochlorothiazide 12.5 MG capsule Commonly known as: MICROZIDE   metFORMIN 500 MG tablet Commonly known as: GLUCOPHAGE   mirabegron ER 25 MG Tb24 tablet Commonly known as: MYRBETRIQ   pantoprazole 40 MG tablet Commonly known as: PROTONIX   triamcinolone 0.025 % ointment Commonly known as: KENALOG   vitamin B-12 1000 MCG tablet Commonly known as: CYANOCOBALAMIN   vitamin C 500 MG tablet Commonly known as: ASCORBIC ACID   Vitamin D (Ergocalciferol) 1.25 MG (50000 UT) Caps capsule Commonly known as: DRISDOL     TAKE these medications   allopurinol 300 MG tablet Commonly known as: ZYLOPRIM Take 300 mg by mouth daily.   aspirin 81 MG tablet Take 81 mg by mouth daily.   chlorthalidone 25 MG tablet Commonly known as: HYGROTON Take 25 mg by mouth daily.   fentaNYL 50 MCG/HR Commonly known as: [place] 1 patch onto  the skin every 3 (three) days.   ondansetron 8 MG tablet Commonly known as: ZOFRAN Take 1 tablet (8 mg total) by mouth every 8 (eight) hours as needed for nausea or vomiting.   oxybutynin 5 MG 24 hr tablet Commonly known as: DITROPAN-XL Take 1 tablet (5 mg total) by mouth 3 (three) times daily. What changed:   how much to take  when to take this   Xtandi 40 MG capsule Generic drug: enzalutamide TAKE 4 CAPSULES (160 MG TOTAL) BY MOUTH DAILY.         DISCHARGE INSTRUCTIONS:   DIET:  Regular diet DISCHARGE CONDITION:  Fair ACTIVITY:  Activity as tolerated OXYGEN:  Home Oxygen: No.  Oxygen Delivery: room air DISCHARGE LOCATION:  Home with Hospice   If you experience worsening of your admission symptoms, develop shortness of breath, life threatening emergency, suicidal or homicidal thoughts you must seek medical attention immediately by calling 911 or calling your MD immediately  if symptoms less severe.  You Must read complete instructions/literature along with all the possible adverse reactions/side effects for all the Medicines you take and that have been prescribed to you. Take any new Medicines after you have completely understood and accpet all the possible adverse reactions/side effects.   Please note  You were cared for by a hospitalist during your hospital stay. If you have any questions about your discharge medications or the care you received while you were in the hospital after you are discharged, you can call the unit and asked to speak with the hospitalist on call if the hospitalist that took care of you is not available. Once you are discharged, your primary care physician will handle any further medical issues. Please note that NO REFILLS for any discharge medications will be authorized once you are discharged, as it is imperative that you return to your primary care physician (or establish a relationship with a primary care physician if you do not have one) for your aftercare needs so that they can reassess your need for medications and monitor your lab values.    On the day of Discharge:  VITAL SIGNS:  Blood pressure 119/73, pulse 66, temperature 98.5 F (36.9 C), temperature source Oral, resp. rate 16, height 6' (1.829 m), weight 67 kg, SpO2 100 %. PHYSICAL EXAMINATION:  GENERAL:  78 y.o.-year-old patient lying in the bed with no acute distress.  EYES: Pupils equal, round, reactive to light and accommodation. No  scleral icterus. Extraocular muscles intact.  HEENT: Head atraumatic, normocephalic. Oropharynx and nasopharynx clear.  NECK:  Supple, no jugular venous distention. No thyroid enlargement, no tenderness.  LUNGS: Normal breath sounds bilaterally, no wheezing, rales,rhonchi or crepitation. No use of accessory muscles of respiration.  CARDIOVASCULAR: S1, S2 normal. No murmurs, rubs, or gallops.  ABDOMEN: Soft, non-tender, non-distended. Bowel sounds present. No organomegaly or mass.  EXTREMITIES: No pedal edema, cyanosis, or clubbing.  NEUROLOGIC: Cranial nerves II through XII are intact. Muscle strength 5/5 in all extremities. Sensation intact. Gait not checked.  PSYCHIATRIC: The patient is alert and oriented x 3.  SKIN: No obvious rash, lesion, or ulcer.  DATA REVIEW:   CBC Recent Labs  Lab 05/04/19 0416  WBC 7.2  HGB 7.9*  HCT 23.9*  PLT 163    Chemistries  Recent Labs  Lab 05/03/19 0509  NA 146*  K 3.5  CL 114*  CO2 23  GLUCOSE 113*  BUN 33*  CREATININE 1.07  CALCIUM 8.3*     Follow-up Information  Little, Jeneen Rinks, MD. Schedule an appointment as soon as possible for a visit in 1 week.   Specialty: Family Medicine Contact information: [address] [place] [address] [phone]           Management plans discussed with the patient, family and they are in agreement.  CODE STATUS: Prior   TOTAL TIME TAKING CARE OF THIS PATIENT: 45 minutes.    Max Sane M.D on 05/09/2019 at 11:55 AM  Between 7am to 6pm - Pager - [phone]  After 6pm go to [email or web address] - Proofreader  [facility]  Office  [phone]  CC: Primary care physician; Tamsen Roers, MD   Note: This dictation was prepared with Dragon dictation along with smaller phrase technology. Any transcriptional errors that result from this process are unintentional.

## 2019-05-12 ENCOUNTER — Telehealth: Payer: Self-pay | Admitting: Hospice and Palliative Medicine

## 2019-05-12 NOTE — Telephone Encounter (Signed)
I called and spoke with patient's wife.  The hospice nurse was currently in the home visiting.  Wife tells me that patient is doing reasonably well and she denies any acute changes or concerns today.  Patient does not currently have any scheduled follow-up visits with our clinic.  Wife said she was comfortable with letting hospice come to the home and to call us as needed or as things clinically change.

## 2019-06-01 ENCOUNTER — Inpatient Hospital Stay: Payer: Medicare HMO

## 2019-06-04 ENCOUNTER — Ambulatory Visit: Payer: Medicare HMO | Admitting: Urology

## 2019-06-21 DEATH — deceased
